# Patient Record
Sex: Male | Born: 1940 | Race: White | Hispanic: No | Marital: Married | Smoking: Former smoker
Health system: Southern US, Community
[De-identification: ages and names within clinical notes are randomized; demographics above are authoritative.]

## PROBLEM LIST (undated history)

## (undated) DIAGNOSIS — Z5189 Encounter for other specified aftercare: Secondary | ICD-10-CM

## (undated) DIAGNOSIS — R011 Cardiac murmur, unspecified: Secondary | ICD-10-CM

## (undated) DIAGNOSIS — Z860100 Personal history of colon polyps, unspecified: Secondary | ICD-10-CM

## (undated) DIAGNOSIS — IMO0001 Reserved for inherently not codable concepts without codable children: Secondary | ICD-10-CM

## (undated) DIAGNOSIS — H269 Unspecified cataract: Secondary | ICD-10-CM

## (undated) DIAGNOSIS — E785 Hyperlipidemia, unspecified: Secondary | ICD-10-CM

## (undated) DIAGNOSIS — Z8719 Personal history of other diseases of the digestive system: Secondary | ICD-10-CM

## (undated) DIAGNOSIS — T4145XA Adverse effect of unspecified anesthetic, initial encounter: Secondary | ICD-10-CM

## (undated) DIAGNOSIS — I10 Essential (primary) hypertension: Secondary | ICD-10-CM

## (undated) DIAGNOSIS — T8859XA Other complications of anesthesia, initial encounter: Secondary | ICD-10-CM

## (undated) DIAGNOSIS — E079 Disorder of thyroid, unspecified: Secondary | ICD-10-CM

## (undated) DIAGNOSIS — I2510 Atherosclerotic heart disease of native coronary artery without angina pectoris: Secondary | ICD-10-CM

## (undated) DIAGNOSIS — M1990 Unspecified osteoarthritis, unspecified site: Secondary | ICD-10-CM

## (undated) DIAGNOSIS — I350 Nonrheumatic aortic (valve) stenosis: Secondary | ICD-10-CM

## (undated) HISTORY — DX: Nonrheumatic aortic (valve) stenosis: I35.0

## (undated) HISTORY — DX: Hyperlipidemia, unspecified: E78.5

## (undated) HISTORY — DX: Unspecified cataract: H26.9

## (undated) HISTORY — DX: Encounter for other specified aftercare: Z51.89

## (undated) HISTORY — DX: Cardiac murmur, unspecified: R01.1

## (undated) HISTORY — DX: Essential (primary) hypertension: I10

## (undated) HISTORY — DX: Atherosclerotic heart disease of native coronary artery without angina pectoris: I25.10

## (undated) HISTORY — DX: Personal history of colon polyps, unspecified: Z86.0100

## (undated) HISTORY — PX: COLONOSCOPY: SHX174

## (undated) HISTORY — PX: POLYPECTOMY: SHX149

## (undated) HISTORY — PX: TONSILLECTOMY: SUR1361

## (undated) HISTORY — DX: Disorder of thyroid, unspecified: E07.9

## (undated) HISTORY — DX: Personal history of colonic polyps: Z86.010

---

## 1980-02-17 HISTORY — PX: CHOLECYSTECTOMY: SHX55

## 1998-02-16 HISTORY — PX: TOTAL HIP ARTHROPLASTY: SHX124

## 1998-02-16 HISTORY — PX: TOTAL KNEE ARTHROPLASTY: SHX125

## 2006-02-16 DIAGNOSIS — I350 Nonrheumatic aortic (valve) stenosis: Secondary | ICD-10-CM

## 2006-02-16 DIAGNOSIS — I2510 Atherosclerotic heart disease of native coronary artery without angina pectoris: Secondary | ICD-10-CM

## 2006-02-16 HISTORY — PX: CARDIAC CATHETERIZATION: SHX172

## 2006-02-16 HISTORY — DX: Atherosclerotic heart disease of native coronary artery without angina pectoris: I25.10

## 2006-02-16 HISTORY — DX: Nonrheumatic aortic (valve) stenosis: I35.0

## 2006-03-26 LAB — HM COLONOSCOPY: HM Colonoscopy: NORMAL

## 2007-02-17 HISTORY — PX: CORONARY ARTERY BYPASS GRAFT: SHX141

## 2007-02-17 HISTORY — PX: AORTIC VALVE REPLACEMENT: SHX41

## 2009-02-16 HISTORY — PX: CORRECTION HAMMER TOE: SUR317

## 2010-04-10 ENCOUNTER — Encounter: Payer: Self-pay | Admitting: Internal Medicine

## 2010-04-10 ENCOUNTER — Ambulatory Visit (INDEPENDENT_AMBULATORY_CARE_PROVIDER_SITE_OTHER): Payer: Medicare Other | Admitting: Internal Medicine

## 2010-04-10 DIAGNOSIS — Z860100 Personal history of colon polyps, unspecified: Secondary | ICD-10-CM | POA: Insufficient documentation

## 2010-04-10 DIAGNOSIS — I252 Old myocardial infarction: Secondary | ICD-10-CM | POA: Insufficient documentation

## 2010-04-10 DIAGNOSIS — I2510 Atherosclerotic heart disease of native coronary artery without angina pectoris: Secondary | ICD-10-CM

## 2010-04-10 DIAGNOSIS — Z952 Presence of prosthetic heart valve: Secondary | ICD-10-CM | POA: Insufficient documentation

## 2010-04-10 DIAGNOSIS — I10 Essential (primary) hypertension: Secondary | ICD-10-CM | POA: Insufficient documentation

## 2010-04-10 DIAGNOSIS — E785 Hyperlipidemia, unspecified: Secondary | ICD-10-CM | POA: Insufficient documentation

## 2010-04-15 NOTE — Assessment & Plan Note (Signed)
Summary: NEW MEDICARE PT--#--PKG--STC   Vital Signs:  Patient profile:   70 year old male Height:      71 inches Weight:      235 pounds BMI:     32.89 O2 Sat:      98 % Temp:     98.9 degrees F oral Pulse rate:   76 / minute Pulse rhythm:   regular Resp:     16 per minute BP sitting:   140 / 66  (left arm) Cuff size:   regular  Vitals Entered By: Lanier Prude, Beverly Gust) (April 10, 2010 4:15 PM)  Nutrition Counseling: Patient's BMI is greater than 25 and therefore counseled on weight management options. CC: Est PCP, Preventive Care Is Patient Diabetic? No Pain Assessment Patient in pain? no        Primary Care Provider:  Yetta Barre  CC:  Est PCP and Preventive Care.  History of Present Illness: New to me he wants a new PCP. He tells me that he saw his prior PCP, Dr. Anner Crete, one month ago and that all of his labs were normal. He does not want to do anu labs today. He feels well and offers no complaints. He has no records withhim today but he will have them forwarded to me.  Preventive Screening-Counseling & Management  Alcohol-Tobacco     Alcohol drinks/day: <1     Alcohol type: beer     >5/day in last 3 mos: no     Alcohol Counseling: not indicated; use of alcohol is not excessive or problematic     Feels need to cut down: no     Feels annoyed by complaints: no     Feels guilty re: drinking: no     Needs 'eye opener' in am: no     Smoking Status: quit > 6 months     Year Quit: 1974     Tobacco Counseling: to remain off tobacco products  Caffeine-Diet-Exercise     Does Patient Exercise: yes  Hep-HIV-STD-Contraception     Hepatitis Risk: no risk noted     HIV Risk: no risk noted     STD Risk: no risk noted     Dental Visit-last 6 months yes     Dental Care Counseling: to seek dental care; no dental care within six months      Sexual History:  currently monogamous.        Drug Use:  no.        Blood Transfusions:  no.    Current Medications  (verified): 1)  Aspirin 325 Mg Tabs (Aspirin) .Marland Kitchen.. 1 By Mouth Once Daily 2)  Crestor 10 Mg Tabs (Rosuvastatin Calcium) .Marland Kitchen.. 1 By Mouth Once Daily 3)  Benicar Hct 40-12.5 Mg Tabs (Olmesartan Medoxomil-Hctz) .Marland Kitchen.. 1 By Mouth Once Daily 4)  Metoprolol Tartrate 25 Mg Tabs (Metoprolol Tartrate) .... 2 By Mouth Once Daily 5)  Furosemide 40 Mg Tabs (Furosemide) .Marland Kitchen.. 1 By Mouth Once Daily 6)  Meloxicam 15 Mg Tabs (Meloxicam) .Marland Kitchen.. 1 By Mouth Once Daily  Allergies (verified): No Known Drug Allergies  Past History:  Past Medical History: Colonic polyps, hx of Coronary artery disease Hyperlipidemia Hypertension Myocardial infarction, hx of Aortic stenosis  Past Surgical History: Aortic Valve Replacement:  Cholecystectomy Total hip replacement Total knee replacement  Family History: Family History High cholesterol Family History Hypertension  Social History: Retired Married Alcohol use-yes Drug use-no Regular exercise-yes Smoking Status:  quit > 6 months Hepatitis Risk:  no risk noted HIV Risk:  no risk noted STD Risk:  no risk noted Dental Care w/in 6 mos.:  yes Sexual History:  currently monogamous Blood Transfusions:  no Drug Use:  no Does Patient Exercise:  yes  Review of Systems       The patient complains of weight gain.  The patient denies anorexia, fever, weight loss, chest pain, syncope, dyspnea on exertion, peripheral edema, prolonged cough, headaches, hemoptysis, abdominal pain, hematuria, muscle weakness, suspicious skin lesions, difficulty walking, depression, unusual weight change, abnormal bleeding, and enlarged lymph nodes.   CV:  Denies chest pain or discomfort, difficulty breathing while lying down, fainting, fatigue, leg cramps with exertion, lightheadness, near fainting, palpitations, shortness of breath with exertion, and swelling of feet.  Physical Exam  General:  alert, well-developed, well-nourished, well-hydrated, appropriate dress, normal appearance,  healthy-appearing, cooperative to examination, and overweight-appearing.   Head:  normocephalic, atraumatic, no abnormalities observed, and no abnormalities palpated.   Eyes:  vision grossly intact, pupils equal, pupils round, and pupils reactive to light.   Ears:  R ear normal and L ear normal.   Nose:  External nasal examination shows no deformity or inflammation. Nasal mucosa are pink and moist without lesions or exudates. Mouth:  Oral mucosa and oropharynx without lesions or exudates.  Teeth in good repair. Neck:  supple, full ROM, no masses, no thyromegaly, no thyroid nodules or tenderness, no JVD, no HJR, normal carotid upstroke, and no carotid bruits.   Lungs:  normal respiratory effort, no intercostal retractions, no accessory muscle use, normal breath sounds, no dullness, no fremitus, no crackles, and no wheezes.   Heart:  normal rate, regular rhythm, no gallop, no rub, and no JVD.   Abdomen:  soft, non-tender, normal bowel sounds, no distention, no masses, no guarding, no rigidity, no rebound tenderness, no abdominal hernia, no inguinal hernia, no hepatomegaly, no splenomegaly, and abdominal scar(s).   Msk:  normal ROM, no joint tenderness, no joint swelling, no joint warmth, no redness over joints, no joint deformities, no joint instability, and no crepitation.   Pulses:  R and L carotid,radial,femoral,dorsalis pedis and posterior tibial pulses are full and equal bilaterally Extremities:  trace left pedal edema and trace right pedal edema.   Neurologic:  No cranial nerve deficits noted. Station and gait are normal. Plantar reflexes are down-going bilaterally. DTRs are symmetrical throughout. Sensory, motor and coordinative functions appear intact.   Impression & Recommendations:  Problem # 1:  HYPERTENSION (ICD-401.9) Assessment Improved  His updated medication list for this problem includes:    Benicar Hct 40-12.5 Mg Tabs (Olmesartan medoxomil-hctz) .Marland Kitchen... 1 by mouth once daily     Metoprolol Tartrate 25 Mg Tabs (Metoprolol tartrate) .Marland Kitchen... 2 by mouth once daily    Furosemide 40 Mg Tabs (Furosemide) .Marland Kitchen... 1 by mouth once daily  BP today: 140/66  Problem # 2:  CORONARY ARTERY DISEASE (ICD-414.00) Assessment: Unchanged  His updated medication list for this problem includes:    Aspirin 325 Mg Tabs (Aspirin) .Marland Kitchen... 1 by mouth once daily    Benicar Hct 40-12.5 Mg Tabs (Olmesartan medoxomil-hctz) .Marland Kitchen... 1 by mouth once daily    Metoprolol Tartrate 25 Mg Tabs (Metoprolol tartrate) .Marland Kitchen... 2 by mouth once daily    Furosemide 40 Mg Tabs (Furosemide) .Marland Kitchen... 1 by mouth once daily  Complete Medication List: 1)  Aspirin 325 Mg Tabs (Aspirin) .Marland Kitchen.. 1 by mouth once daily 2)  Crestor 10 Mg Tabs (Rosuvastatin calcium) .Marland Kitchen.. 1 by mouth once daily 3)  Benicar Hct 40-12.5 Mg Tabs (Olmesartan  medoxomil-hctz) .Marland Kitchen.. 1 by mouth once daily 4)  Metoprolol Tartrate 25 Mg Tabs (Metoprolol tartrate) .... 2 by mouth once daily 5)  Furosemide 40 Mg Tabs (Furosemide) .Marland Kitchen.. 1 by mouth once daily 6)  Meloxicam 15 Mg Tabs (Meloxicam) .Marland Kitchen.. 1 by mouth once daily  Colorectal Screening:  Current Recommendations:    Hemoccult: patient refused  Colonoscopy Results:    Date of Exam: 06/07/2006    Results: Normal  PSA Screening:    Reviewed PSA screening recommendations: Pro's and Cons's of PSA discussed and patient chooses to defer  Immunization & Chemoprophylaxis:    Influenza vaccine: Historical  (11/22/2009)    Pneumovax: Historical  (12/19/2008)  Patient Instructions: 1)  Please schedule a follow-up appointment in 3 months. 2)  It is important that you exercise regularly at least 20 minutes 5 times a week. If you develop chest pain, have severe difficulty breathing, or feel very tired , stop exercising immediately and seek medical attention. 3)  You need to lose weight. Consider a lower calorie diet and regular exercise.  4)  Check your Blood Pressure regularly. If it is above 130/80: you should  make an appointment.   Orders Added: 1)  New Patient Level III [99203]   Immunization History:  Influenza Immunization History:    Influenza:  historical (11/22/2009)  Pneumovax Immunization History:    Pneumovax:  historical (12/19/2008)   Immunization History:  Influenza Immunization History:    Influenza:  Historical (11/22/2009)  Pneumovax Immunization History:    Pneumovax:  Historical (12/19/2008)  Prevention & Chronic Care Immunizations   Influenza vaccine: Historical  (11/22/2009)    Tetanus booster: Not documented   Td booster deferral: Refused  (04/10/2010)    Pneumococcal vaccine: Historical  (12/19/2008)    H. zoster vaccine: Not documented   H. zoster vaccine deferral: Refused  (04/10/2010)  Colorectal Screening   Hemoccult: Not documented   Hemoccult action/deferral: patient refused  (04/10/2010)    Colonoscopy: Normal  (06/07/2006)  Other Screening   PSA: Not documented   PSA action/deferral: Pro's and Cons's of PSA discussed and patient chooses to defer  (04/10/2010)   Smoking status: quit > 6 months  (04/10/2010)  Lipids   Total Cholesterol: Not documented   LDL: Not documented   LDL Direct: Not documented   HDL: Not documented   Triglycerides: Not documented    SGOT (AST): Not documented   SGPT (ALT): Not documented   Alkaline phosphatase: Not documented   Total bilirubin: Not documented  Hypertension   Last Blood Pressure: 140 / 66  (04/10/2010)   Serum creatinine: Not documented   Serum potassium Not documented  Self-Management Support :    Hypertension self-management support: Not documented    Lipid self-management support: Not documented

## 2010-07-18 ENCOUNTER — Telehealth: Payer: Self-pay | Admitting: *Deleted

## 2010-07-18 NOTE — Telephone Encounter (Signed)
sure

## 2010-07-18 NOTE — Telephone Encounter (Signed)
Pending signature

## 2010-07-18 NOTE — Telephone Encounter (Signed)
Patient requesting permanent handicap form, OK?

## 2010-07-21 NOTE — Telephone Encounter (Signed)
Patient notified to pick up/LMOVM 

## 2010-08-06 ENCOUNTER — Telehealth: Payer: Self-pay | Admitting: *Deleted

## 2010-08-06 DIAGNOSIS — L309 Dermatitis, unspecified: Secondary | ICD-10-CM

## 2010-08-06 NOTE — Telephone Encounter (Signed)
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  Can't get into [facility] until end of August w/o referral.

## 2010-08-06 NOTE — Telephone Encounter (Signed)
done

## 2010-08-08 NOTE — Telephone Encounter (Signed)
Pt advised of referral and will expect a call from [facility] with appt info 

## 2010-08-26 ENCOUNTER — Encounter: Payer: Self-pay | Admitting: Internal Medicine

## 2010-09-01 ENCOUNTER — Ambulatory Visit (INDEPENDENT_AMBULATORY_CARE_PROVIDER_SITE_OTHER): Payer: Medicare Other | Admitting: Internal Medicine

## 2010-09-01 ENCOUNTER — Other Ambulatory Visit (INDEPENDENT_AMBULATORY_CARE_PROVIDER_SITE_OTHER): Payer: 59

## 2010-09-01 ENCOUNTER — Other Ambulatory Visit: Payer: Self-pay | Admitting: *Deleted

## 2010-09-01 ENCOUNTER — Ambulatory Visit (INDEPENDENT_AMBULATORY_CARE_PROVIDER_SITE_OTHER)
Admission: RE | Admit: 2010-09-01 | Discharge: 2010-09-01 | Disposition: A | Discharge status: Home / Self Care | Payer: 59 | Source: Ambulatory Visit | Attending: Internal Medicine | Admitting: Internal Medicine

## 2010-09-01 ENCOUNTER — Telehealth: Payer: Self-pay | Admitting: *Deleted

## 2010-09-01 ENCOUNTER — Other Ambulatory Visit: Payer: Self-pay | Admitting: Internal Medicine

## 2010-09-01 ENCOUNTER — Encounter: Payer: Self-pay | Admitting: Internal Medicine

## 2010-09-01 DIAGNOSIS — M546 Pain in thoracic spine: Secondary | ICD-10-CM

## 2010-09-01 DIAGNOSIS — E785 Hyperlipidemia, unspecified: Secondary | ICD-10-CM

## 2010-09-01 DIAGNOSIS — Z23 Encounter for immunization: Secondary | ICD-10-CM

## 2010-09-01 DIAGNOSIS — N400 Enlarged prostate without lower urinary tract symptoms: Secondary | ICD-10-CM

## 2010-09-01 DIAGNOSIS — Z0000 Encounter for general adult medical examination without abnormal findings: Secondary | ICD-10-CM

## 2010-09-01 DIAGNOSIS — M549 Dorsalgia, unspecified: Secondary | ICD-10-CM

## 2010-09-01 DIAGNOSIS — M545 Low back pain: Secondary | ICD-10-CM

## 2010-09-01 DIAGNOSIS — I10 Essential (primary) hypertension: Secondary | ICD-10-CM

## 2010-09-01 DIAGNOSIS — Z125 Encounter for screening for malignant neoplasm of prostate: Secondary | ICD-10-CM

## 2010-09-01 LAB — LIPID PANEL
Cholesterol: 183 mg/dL (ref 0–200)
HDL: 51.1 mg/dL (ref >39.00)
LDL Cholesterol: 105 mg/dL — ABNORMAL HIGH (ref 0–99)
Triglycerides: 133 mg/dL (ref 0.0–149.0)
VLDL: 26.6 mg/dL (ref 0.0–40.0)

## 2010-09-01 LAB — CBC WITH DIFFERENTIAL/PLATELET
Basophils Absolute: 0 10*3/uL (ref 0.0–0.1)
Eosinophils Absolute: 0.3 10*3/uL (ref 0.0–0.7)
Hemoglobin: 13.9 g/dL (ref 13.0–17.0)
Lymphocytes Relative: 25.9 % (ref 12.0–46.0)
MCHC: 33.8 g/dL (ref 30.0–36.0)
Neutro Abs: 5.4 10*3/uL (ref 1.4–7.7)
Platelets: 166 10*3/uL (ref 150.0–400.0)
RDW: 14.4 % (ref 11.5–14.6)

## 2010-09-01 LAB — URINALYSIS, ROUTINE W REFLEX MICROSCOPIC
Bilirubin Urine: NEGATIVE
Hgb urine dipstick: NEGATIVE
Ketones, ur: NEGATIVE
Leukocytes, UA: NEGATIVE
Nitrite: NEGATIVE
Specific Gravity, Urine: 1.01
Total Protein, Urine: NEGATIVE
Urine Glucose: NEGATIVE
Urobilinogen, UA: 0.2
pH: 5 (ref 5.0–8.0)

## 2010-09-01 LAB — PSA: PSA: 0.98 ng/mL (ref 0.10–4.00)

## 2010-09-01 LAB — COMPREHENSIVE METABOLIC PANEL WITH GFR
ALT: 27 U/L (ref 0–53)
AST: 29 U/L (ref 0–37)
Albumin: 4.2 g/dL (ref 3.5–5.2)
Alkaline Phosphatase: 71 U/L (ref 39–117)
BUN: 24 mg/dL — ABNORMAL HIGH (ref 6–23)
CO2: 29 meq/L (ref 19–32)
Calcium: 9 mg/dL (ref 8.4–10.5)
Chloride: 100 meq/L (ref 96–112)
Creatinine, Ser: 1.1 mg/dL (ref 0.4–1.5)
GFR: 71.98 mL/min
Glucose, Bld: 107 mg/dL — ABNORMAL HIGH (ref 70–99)
Potassium: 4.5 meq/L (ref 3.5–5.1)
Sodium: 138 meq/L (ref 135–145)
Total Bilirubin: 0.8 mg/dL (ref 0.3–1.2)
Total Protein: 7.5 g/dL (ref 6.0–8.3)

## 2010-09-01 MED ORDER — TRAMADOL HCL 50 MG PO TABS: 50.0000 mg | ORAL_TABLET | Freq: Four times a day (QID) | ORAL | Status: DC | PRN

## 2010-09-01 MED ORDER — TRAMADOL HCL 50 MG PO TABS
50.0000 mg | ORAL_TABLET | Freq: Four times a day (QID) | ORAL | Status: DC | PRN
Start: 2010-09-01 — End: 2010-09-01

## 2010-09-01 NOTE — Assessment & Plan Note (Signed)
His Xray shows spondylosis, I will refer him to PT/pain med if needed

## 2010-09-01 NOTE — Telephone Encounter (Signed)
The xray showed bone spurs along his spine, Rx for tramadol needs to be called in

## 2010-09-01 NOTE — Telephone Encounter (Signed)
Pt requesting results of xray and states he takes meloxicam for pain but medication no strong enough. Please Advise

## 2010-09-01 NOTE — Progress Notes (Signed)
Subjective:    Patient ID: Luis Herrera, male    DOB: Feb 02, 1941, 70 y.o.   MRN: [id number]  Back Pain This is a new problem. The current episode started more than 1 month ago. The problem occurs intermittently. The pain is present in the thoracic spine. The quality of the pain is described as burning and shooting. The pain is at a severity of 3/10. The pain is mild. The pain is worse during the day. The symptoms are aggravated by position. Stiffness is present all day. Pertinent negatives include no abdominal pain, bladder incontinence, bowel incontinence, chest pain, dysuria, fever, headaches, leg pain, numbness, paresis, paresthesias, pelvic pain, perianal numbness, tingling, weakness or weight loss. Risk factors include poor posture and lack of exercise. He has tried NSAIDs for the symptoms. The treatment provided moderate relief.  Hypertension This is a chronic problem. The current episode started more than 1 year ago. The problem has been gradually improving since onset. The problem is controlled. Pertinent negatives include no anxiety, blurred vision, chest pain, headaches, malaise/fatigue, neck pain, orthopnea, palpitations, peripheral edema, PND, shortness of breath or sweats. There are no associated agents to hypertension. Past treatments include angiotensin blockers and diuretics. The current treatment provides significant improvement. Compliance problems include exercise and diet.  There is no history of chronic renal disease.  Hyperlipidemia This is a chronic problem. The current episode started more than 1 year ago. The problem is controlled. Recent lipid tests were reviewed and are variable. Exacerbating diseases include obesity. He has no history of chronic renal disease, diabetes, hypothyroidism, liver disease or nephrotic syndrome. Factors aggravating his hyperlipidemia include beta blockers and fatty foods. Pertinent negatives include no chest pain, focal sensory loss, focal weakness, leg  pain, myalgias or shortness of breath. Current antihyperlipidemic treatment includes statins. The current treatment provides moderate improvement of lipids. Compliance problems include adherence to exercise and adherence to diet.       Review of Systems  Constitutional: Negative for fever, chills, weight loss, malaise/fatigue, diaphoresis, activity change, appetite change, fatigue and unexpected weight change.  HENT: Negative for facial swelling, trouble swallowing, neck pain and voice change.   Eyes: Negative for blurred vision, photophobia, redness and visual disturbance.  Respiratory: Negative for apnea, cough, choking, chest tightness, shortness of breath, wheezing and stridor.   Cardiovascular: Negative for chest pain, palpitations, orthopnea and PND.  Gastrointestinal: Negative for nausea, vomiting, abdominal pain, diarrhea, constipation, blood in stool, anal bleeding, rectal pain and bowel incontinence.  Genitourinary: Negative for bladder incontinence, dysuria, urgency, frequency, hematuria, flank pain, decreased urine volume, discharge, penile swelling, scrotal swelling, enuresis, difficulty urinating, genital sores, penile pain, testicular pain and pelvic pain.  Musculoskeletal: Positive for back pain (upper back). Negative for myalgias, joint swelling, arthralgias and gait problem.  Skin: Negative for color change, pallor, rash and wound.  Neurological: Negative for dizziness, tingling, tremors, focal weakness, seizures, syncope, facial asymmetry, speech difficulty, weakness, light-headedness, numbness, headaches and paresthesias.  Hematological: Negative for adenopathy. Does not bruise/bleed easily.  Psychiatric/Behavioral: Negative.        Objective:   Physical Exam  Vitals reviewed. Constitutional: He is oriented to person, place, and time. He appears well-developed and well-nourished. No distress.  HENT:  Head: Normocephalic and atraumatic.  Right Ear: External ear normal.    Left Ear: External ear normal.  Nose: Nose normal.  Mouth/Throat: Oropharynx is clear and moist. No oropharyngeal exudate.  Eyes: Conjunctivae and EOM are normal. Pupils are equal, round, and reactive to light. Right eye exhibits  no discharge. Left eye exhibits no discharge. No scleral icterus.  Neck: Normal range of motion. Neck supple. No JVD present. No tracheal deviation present. No thyromegaly present.  Cardiovascular: Normal rate, regular rhythm, normal heart sounds and intact distal pulses.  Exam reveals no gallop and no friction rub.   No murmur heard. Pulmonary/Chest: Effort normal and breath sounds normal. No stridor. No respiratory distress. He has no wheezes. He has no rales. Chest wall is not dull to percussion. He exhibits deformity. He exhibits no mass, no tenderness, no bony tenderness, no laceration, no crepitus, no edema, no swelling and no retraction. Right breast exhibits no inverted nipple, no mass, no nipple discharge, no skin change and no tenderness. Left breast exhibits no inverted nipple, no mass, no nipple discharge, no skin change and no tenderness. Breasts are symmetrical.         Sternotomy scar  Abdominal: Soft. Bowel sounds are normal. He exhibits no distension and no mass. There is no tenderness. There is no rebound and no guarding. Hernia confirmed negative in the right inguinal area and confirmed negative in the left inguinal area.  Genitourinary: Rectum normal and testes normal. Rectal exam shows no external hemorrhoid, no internal hemorrhoid, no fissure, no mass, no tenderness and anal tone normal. Guaiac negative stool. Prostate is enlarged (1+ bilateral smooth, symmetrical hypertrophy). Prostate is not tender. Right testis shows no mass, no swelling and no tenderness. Right testis is descended. Left testis shows no mass, no swelling and no tenderness. Left testis is descended. Circumcised. No penile tenderness. No discharge found.  Musculoskeletal: Normal range  of motion. He exhibits no edema and no tenderness.       Cervical back: Normal. He exhibits normal range of motion, no tenderness, no bony tenderness, no swelling, no edema, no deformity, no laceration, no pain, no spasm and normal pulse.       Thoracic back: Normal. He exhibits normal range of motion, no tenderness, no bony tenderness, no swelling, no edema, no deformity, no laceration, no pain and no spasm.  Lymphadenopathy:    He has no cervical adenopathy.       Right: No inguinal adenopathy present.       Left: No inguinal adenopathy present.  Neurological: He is alert and oriented to person, place, and time. He displays no atrophy, no tremor and normal reflexes. No cranial nerve deficit or sensory deficit. He exhibits normal muscle tone. He displays no seizure activity. Coordination and gait normal. He displays no Babinski's sign on the left side.  Reflex Scores:      Tricep reflexes are 1+ on the right side and 1+ on the left side.      Bicep reflexes are 1+ on the right side and 1+ on the left side.      Brachioradialis reflexes are 1+ on the right side and 1+ on the left side.      Patellar reflexes are 1+ on the right side and 1+ on the left side.      Achilles reflexes are 1+ on the right side and 1+ on the left side. Skin: Skin is warm and dry. No rash noted. He is not diaphoretic. No erythema. No pallor.  Psychiatric: He has a normal mood and affect. His behavior is normal. Judgment and thought content normal.          Assessment & Plan:

## 2010-09-01 NOTE — Assessment & Plan Note (Signed)
The patient is here for annual Medicare wellness examination and management of other chronic and acute problems.   The risk factors are reflected in the social history.  The roster of all physicians providing medical care to patient - is listed in the Snapshot section of the chart.  Activities of daily living:  The patient is 100% inedpendent in all ADLs: dressing, toileting, feeding as well as independent mobility  Home safety : The patient has smoke detectors in the home. They wear seatbelts.No firearms at home ( firearms are present in the home, kept in a safe fashion). There is no violence in the home.   There is no risks for hepatitis, STDs or HIV. There is no   history of blood transfusion. They have no travel history to infectious disease endemic areas of the world.  The patient has (has not) seen their dentist in the last six month. They have (not) seen their eye doctor in the last year. They deny (admit to) any hearing difficulty and have not had audiologic testing in the last year.  They do not  have excessive sun exposure. Discussed the need for sun protection: hats, long sleeves and use of sunscreen if there is significant sun exposure.   Diet: the importance of a healthy diet is discussed. They do have a healthy (unhealthy-high fat/fast food) diet.  The patient has a regular exercise program: walking , 20 minutes duration,  4 times per week.  The benefits of regular aerobic exercise were discussed.  Depression screen: there are no signs or vegative symptoms of depression- irritability, change in appetite, anhedonia, sadness/tearfullness.  Cognitive assessment: the patient manages all their financial and personal affairs and is actively engaged. They could relate day,date,year and events; recalled 3/3 objects at 3 minutes; performed clock-face test normally.  The following portions of the patient's history were reviewed and updated as appropriate: allergies, current medications,  past family history, past medical history,  past surgical history, past social history  and problem list.  Vision, hearing, body mass index were assessed and reviewed.   During the course of the visit the patient was educated and counseled about appropriate screening and preventive services including : fall prevention , diabetes screening, nutrition counseling, colorectal cancer screening, and recommended immunizations.  

## 2010-09-01 NOTE — Assessment & Plan Note (Signed)
BP is well controlled, I will check his lytes and renal function today 

## 2010-09-01 NOTE — Assessment & Plan Note (Signed)
He is doing well on crestor, I will check his labs today 

## 2010-09-01 NOTE — Patient Instructions (Signed)
Back Pain (Lumbosacral Strain) Back pain is one of the most common causes of pain. There are many causes of back pain. Most are not serious conditions.  CAUSES Your backbone (spinal column) is made up of 24 main vertebral bodies, the sacrum, and the coccyx. These are held together by muscles and tough, fibrous tissue (ligaments). Nerve roots pass through the openings between the vertebrae. A sudden move or injury to the back may cause injury to, or pressure on, these nerves. This may result in localized back pain or pain movement (radiation) into the buttocks, down the leg, and into the foot. Sharp, shooting pain from the buttock down the back of the leg (sciatica) is frequently associated with a ruptured (herniated) disc. Pain may be caused by muscle spasm alone. Your caregiver can often find the cause of your pain by the details of your symptoms and an exam. In some cases, you may need tests (such as X-rays). Your caregiver will work with you to decide if any tests are needed based on your specific exam. HOME CARE INSTRUCTIONS  Avoid an underactive lifestyle. Active exercise, as directed by your caregiver, is your greatest weapon against back pain.   Avoid hard physical activities (tennis, racquetball, water-skiing) if you are not in proper physical condition for it. This may aggravate and/or create problems.   If you have a back problem, avoid sports requiring sudden body movements. Swimming and walking are generally safer activities.   Maintain good posture.   Avoid becoming overweight (obese).   Use bed rest for only the most extreme, sudden (acute) episode. Your caregiver will help you determine how much bed rest is necessary.   For acute conditions, you may put ice on the injured area.   Put ice in a plastic bag.   Place a towel between your skin and the bag.   Leave the ice on for 20 minutes at a time, every 2 hours, or as needed.   After you are improved and more active, it may  help to apply heat for 30 minutes before activities.  See your caregiver if you are having pain that lasts longer than expected. Your caregiver can advise appropriate exercises and/or therapy if needed. With conditioning, most back problems can be avoided. SEEK IMMEDIATE MEDICAL CARE IF:  You have numbness, tingling, weakness, or problems with the use of your arms or legs.   You experience severe back pain not relieved with medicines.   There is a change in bowel or bladder control.   You have increasing pain in any area of the body, including your belly (abdomen).   You notice shortness of breath, dizziness, or feel faint.   You feel sick to your stomach (nauseous), are throwing up (vomiting), or become sweaty.   You notice discoloration of your toes or legs, or your feet get very cold.   Your back pain is getting worse.  You have an oral temperature above 100.5Health Maintenance in Males MAINTAIN REGULAR HEALTH EXAMS Maintain a healthy diet and normal weight. Increased weight leads to problems with blood pressure and diabetes. Decrease fat in the diet and increase exercise. Obtain a proper diet from your caregiver if necessary.  High blood pressure causes heart and blood vessel problems. Check blood pressures regularly and keep your blood pressure at normal limits. Aerobic exercise helps this. Persistent elevations of blood pressure should be treated with medications if weight loss and exercise are ineffective.  Avoid smoking, drinking in excess (more than 2 drinks per   day), or use of street drugs. Do not share needles with anyone. Ask for help if you need assistance or instructions on stopping the use of alcohol, cigarettes, or drugs.  Maintain normal blood lipids and cholesterol. Your caregiver can give you information to lower your risk of heart disease or stroke.  Ask your caregiver if you are in need of early heart disease screening because of a strong family history of heart disease  or signs of elevated testosterone (male sex hormone) levels. These can predispose you to early heart disease.  Practice safe sex. Practicing safe sex decreases your risk for a sexually transmitted infection (STI). Some of the STIs are gonorrhea, chlamydia, syphilis, trichimonas, herpes, human papillomavirus (HPV), and human immunodeficiency virus (HIV). Herpes, HIV, and HPV are viral illnesses that have no cure. These can result in disability, cancer, and death.  It is not safe for someone who has AIDS or is HIV positive to have unprotected sex with a partner who is HIV positive. The reason for this is the fact that there are many different strains of HIV. If you have a strain that is readily treated with medications and then suddenly introduce a strain from a partner that has no further treatment options, you may suddenly have a strain of HIV that is untreatable. Even if you are both positive for HIV, it is still necessary to practice safe sex.  Use sunscreen with a SPF of 15 or greater. Being outside in the sun when your shadow caused by the sun is shorter than you are, means you are being exposed to sun at greater intensity. Lighter skinned people are at a greater risk of skin cancer.  Keep carbon monoxide and smoke detectors in your home and functioning at all times. Change the batteries every 6 months.  Do monthly examinations of your testicles. The best time to do this is after a hot shower or bath when the tissues are loose. Notify your caregivers of any lumps, tenderness, or changes in size or shape.  Notify your caregiver of new moles or changes in moles, especially if there is a change in shape or color. Also notify your caregiver if a mole is larger than the size of a pencil eraser.  Stay current with your tetanus shots and other required immunizations.  The Body Mass Index (BMI) is a way of measuring how much of your body is fat. Having a BMI above 27 increases the risk of heart disease,  diabetes, hypertension, stroke, and other problems related to obesity. Document Released: 08/01/2007 Document Re-Released: 07/23/2009  [employer] Patient Information 2011 [employer] not controlled by medicine.  MAKE SURE YOU:   Understand these instructions.   Will watch your condition.   Will get help right away if you are not doing well or get worse.  Document Released: 11/12/2004 Document Re-Released: 04/29/2009 [employer] Patient Information 2011 [employer] 

## 2010-09-01 NOTE — Assessment & Plan Note (Signed)
I will check his PSA today 

## 2010-09-03 ENCOUNTER — Telehealth: Payer: Self-pay | Admitting: *Deleted

## 2010-09-03 DIAGNOSIS — M4714 Other spondylosis with myelopathy, thoracic region: Secondary | ICD-10-CM

## 2010-09-03 NOTE — Telephone Encounter (Signed)
Pt was recently seen in office and states that his Herby Abraham says he has bone spurs. Pt states that Tramadol is not helping with his pain, he is requesting MD's advisement on what to do next

## 2010-09-04 DIAGNOSIS — M4714 Other spondylosis with myelopathy, thoracic region: Secondary | ICD-10-CM | POA: Insufficient documentation

## 2010-09-04 NOTE — Telephone Encounter (Signed)
Referral to pain doc done

## 2010-09-04 NOTE — Telephone Encounter (Signed)
Pt informed

## 2010-09-05 ENCOUNTER — Telehealth: Payer: Self-pay

## 2010-09-05 NOTE — Telephone Encounter (Signed)
Patient called [facility] requesting status of pain mang referral. Thanks

## 2010-09-20 ENCOUNTER — Encounter: Payer: Self-pay | Admitting: Family Medicine

## 2010-09-20 ENCOUNTER — Ambulatory Visit (INDEPENDENT_AMBULATORY_CARE_PROVIDER_SITE_OTHER): Payer: Medicare Other | Admitting: Family Medicine

## 2010-09-20 VITALS — BP 134/76 | HR 80 | Temp 98.7°F | Wt 242.8 lb

## 2010-09-20 DIAGNOSIS — L0292 Furuncle, unspecified: Secondary | ICD-10-CM | POA: Insufficient documentation

## 2010-09-20 MED ORDER — DOXYCYCLINE HYCLATE 100 MG PO CAPS: 100.0000 mg | ORAL_CAPSULE | Freq: Two times a day (BID) | ORAL | Status: AC

## 2010-09-20 NOTE — Assessment & Plan Note (Signed)
IC obtained.  Unable to have pt sign form as don't know where forms are [facility] but discussed risks and benefits of procedure and alternatives.  Pt agrees to proceed. I&D performed. Pt tolerated well. Doxy 10 days. Red flags to return discussed.

## 2010-09-20 NOTE — Patient Instructions (Signed)
I&D performed today. Watch out for fevers, spreading redness or if starts draining pus.  If that happens, please return to be checked. Take antibiotics as directed.  Abscess/Boil (Furuncle) An abscess (boil or furuncle) is an infected area that contains a collection of pus.  SYMPTOMS Signs and symptoms of an abscess include pain, tenderness, redness, or hardness. You may feel a moveable soft area under your skin. An abscess can occur anywhere in the body.  TREATMENT An incision (cut by the caregiver) may have been made over your abscess so the pus could be drained out. Gauze may have been packed into the space or a drain may have been looped thru the abscess cavity (pocket). This provides a drain that will allow the cavity to heal from the inside outwards. The abscess may be painful for a few days, but should feel much better if it was drained. Your abscess, if seen early, may not have localized and may not have been drained. If not, another appointment may be required if it does not get better on its own or with medications. HOME CARE INSTRUCTIONS  Only take over-the-counter or prescription medicines for pain, discomfort, or fever as directed by your caregiver.   Keep the skin and clothes clean around your abscess.   If the abscess was drained, you will need to use gauze dressing ("4x4") to collect any draining pus. These dressing typically will need to be changed 3 or more times during the day.   The infection may spread by skin contact with others. Avoid skin contact as much as possible.   Good hygiene is very important including regular hand washing, cover any draining skin lesions, and don't share personal care items.   If you participate in sports do not share athletic equipment, towels, whirlpools, or personal care items. Shower after every practice or tournament.   If a draining area cannot be adequately covered:   Do not participate in sports   Children should not participate in  day care until the wound has healed or drainage stops.   If your caregiver has given you a follow-up appointment, it is very important to keep that appointment. Not keeping the appointment could result in a much worse infection, chronic or permanent injury, pain, and disability. If there is any problem keeping the appointment, you must call back to this facility for assistance.  SEEK MEDICAL CARE IF:  You develop increased pain, swelling, redness, drainage, or bleeding in the wound site.   You develop signs of generalized infection including muscle aches, chills, fever, or a general ill feeling.   You or your child has an oral temperature above 101.   Your baby is older than 3 months with a rectal temperature of 100.5 F (38.1 C) or higher for more than 1 day.  See your caregiver as directed for a recheck or sooner if you develop any of the symptoms described above. Take antibiotics (medicine that kills germs) as directed if they were prescribed. MAKE SURE YOU:   Understand these instructions.   Will watch your condition.   Will get help right away if you are not doing well or get worse.  Document Released: 11/12/2004 Document Re-Released: 07/23/2009 [facility] Patient Information 2011 [place], [place].

## 2010-09-20 NOTE — Progress Notes (Signed)
  Subjective:    Patient ID: Taksh Hjort, male    DOB: 1940/07/31, 70 y.o.   MRN: [id number]  HPI CC: abd lesion  1wk h/o spot on abd.  Started as boil several weeks ago.  Then this week got worse.  Not tender.  Started draining today.  Hasn't tried anything so far.  No fevers/chills, abd pain, n/v.  No h/o DM.  Just had foot surgery.  Not on abx.  Review of Systems Per HPI    Objective:   Physical Exam  Nursing note and vitals reviewed. Constitutional: He appears well-developed and well-nourished. No distress.  Abdominal: Soft.  Skin: Skin is warm and dry.          2cm blister filled with pus/blood mid abdomen inferior to umbilicus.  Some fluctuance, no induration.  Mild erythema surrounding Scab on right side of lesion.    Area prepped with betadine, anesthesia achieved with 1cc 2%lidocaine with epi, 11 scalpel blade used to cut into skin overlying lesion.  Small amt pus expressed from lesion.  Small loculation broken up with cotton tip applicator.  Too small area for packing.  Cleaned and abx ointment placed, dressed with gauze.     Assessment & Plan:

## 2010-09-24 ENCOUNTER — Encounter: Payer: Self-pay | Admitting: Internal Medicine

## 2010-10-10 ENCOUNTER — Encounter: Payer: Medicare Other | Attending: Physical Medicine & Rehabilitation

## 2010-10-10 ENCOUNTER — Ambulatory Visit: Payer: Medicare Other | Admitting: Physical Medicine & Rehabilitation

## 2010-10-10 DIAGNOSIS — M779 Enthesopathy, unspecified: Secondary | ICD-10-CM

## 2010-10-10 DIAGNOSIS — Z96649 Presence of unspecified artificial hip joint: Secondary | ICD-10-CM | POA: Insufficient documentation

## 2010-10-10 DIAGNOSIS — Z79899 Other long term (current) drug therapy: Secondary | ICD-10-CM | POA: Insufficient documentation

## 2010-10-10 DIAGNOSIS — Z96659 Presence of unspecified artificial knee joint: Secondary | ICD-10-CM | POA: Insufficient documentation

## 2010-10-10 DIAGNOSIS — S43429A Sprain of unspecified rotator cuff capsule, initial encounter: Secondary | ICD-10-CM

## 2010-10-10 DIAGNOSIS — I10 Essential (primary) hypertension: Secondary | ICD-10-CM | POA: Insufficient documentation

## 2010-10-10 DIAGNOSIS — M25519 Pain in unspecified shoulder: Secondary | ICD-10-CM | POA: Insufficient documentation

## 2010-10-10 DIAGNOSIS — M25819 Other specified joint disorders, unspecified shoulder: Secondary | ICD-10-CM | POA: Insufficient documentation

## 2010-10-10 NOTE — Progress Notes (Signed)
REASON FOR VISIT:  Pain around the right shoulder blade.  HISTORY:  A 70 year old male with no history of trauma.  He is 1 year post onset of some pain around his right shoulder blade.  He denies any neck pain.  He has no pain going down the arm.  He has had a fall while he was reading something and slipped on some steps.  Bruised his right knee and jammed his right shoulder.  He has had right shoulder pain intermittently before, but somewhat more constant now.  No numbness and tingling in the upper or lower extremities.  No bowel or bladder dysfunction.  PAST MEDICAL HISTORY:  Significant for hypertension as well as hyperlipidemia.  Valve replacement do not have records of this in 2009, also had hammer toes repaired right foot in June of this year.  PAST SURGICAL HISTORY:  Left hip replacement, left knee replacement in 2003 and 2004.  His Oswestry score is 2.  MEDICATIONS: 1. Benicar/hydrochlorothiazide 1 p.o. daily. 2. Metoprolol 50 mg a day. 3. Meloxicam 15 mg a day. 4. Furosemide 40 mg a day. 5. Crestor 10 mg a day. 6. Aspirin 325 daily.  REVIEW OF SYSTEMS:  Average pain is 5.  Pain right now is 2.  Sleep is fair.  Pain improves with rest.  He drives.  He is fully functional.  He is retired.  His review of systems is otherwise negative.  SOCIAL HISTORY:  Married, lives with his wife.  Nonsmoker, nondrinker.  FAMILY HISTORY:  Hypertension.  PHYSICAL EXAMINATION:  VITAL SIGNS:  Blood pressure 120/55, pulse 81, respirations 18, and O2 sat 97% on room air. GENERAL:  No acute stress.  Mood and affect appropriate. NECK:  Has full range of motion without pain.  Left shoulder is normal range of motion.  Negative impingement signs.  Right shoulder is normal range of motion, but positive impingement sign at 90 degrees.  He has normal strength in the upper extremities.  He has no tenderness over the [facility] joint.  He has tenderness over inferior angle of the scapula.  This is more on  the medial aspect of it rather than the lateral aspect.  He has negative winging of the scapula.  He has minimal tenderness over the thoracic paraspinal muscles.  His lower extremity strength is normal. He has healed scar of his left total knee.  Good range of motion, bilateral knees, hips, and ankles.  He has an abrasion on his right knee. Gait is without toe drag or knee instability.  IMPRESSION: 1. Right parascapular muscle strain, this could be either trapezius or     rhomboid muscles. 2. 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  No sign     of tear at least clinically. 3. Status post left hip and knee replacement.  No complaints in     regards to this. 4. Right knee abrasion, no signs of joint abnormalities or effusion.  PLAN: 1. We will refer to physical therapy in regards to upper extremity     parascapular strengthening program.  He may use modalities and also     instructing home exercise program. 2. I will see the patient back in 1 month.  If not much better, we     will either do a subacromial injection or trigger point injection     depending on which area is bothering him either the shoulder joint     area versus the parascapular area.  Failing that, we may do a  ultrasound of the right shoulder.  Discussed with the patient and wife, agree with plan.     Erick Colace, M.D. Electronically Signed    AEK/MedQ D:  10/10/2010 13:22:50  T:  10/10/2010 18:15:35  Job #:  [id number]  cc:   Sanda Linger, MD [address] [place] [address] [place] [address]

## 2010-10-14 ENCOUNTER — Ambulatory Visit: Payer: Medicare Other | Attending: Physical Medicine & Rehabilitation | Admitting: Physical Therapy

## 2010-10-14 DIAGNOSIS — IMO0001 Reserved for inherently not codable concepts without codable children: Secondary | ICD-10-CM | POA: Insufficient documentation

## 2010-10-14 DIAGNOSIS — M25619 Stiffness of unspecified shoulder, not elsewhere classified: Secondary | ICD-10-CM | POA: Insufficient documentation

## 2010-10-14 DIAGNOSIS — M25519 Pain in unspecified shoulder: Secondary | ICD-10-CM | POA: Insufficient documentation

## 2010-10-16 ENCOUNTER — Encounter: Payer: Medicare Other | Admitting: Physical Therapy

## 2010-10-17 ENCOUNTER — Ambulatory Visit: Payer: Medicare Other | Admitting: Physical Therapy

## 2010-10-21 ENCOUNTER — Ambulatory Visit: Payer: Medicare Other | Attending: Physical Medicine & Rehabilitation | Admitting: Physical Therapy

## 2010-10-21 DIAGNOSIS — M25619 Stiffness of unspecified shoulder, not elsewhere classified: Secondary | ICD-10-CM | POA: Insufficient documentation

## 2010-10-21 DIAGNOSIS — M25519 Pain in unspecified shoulder: Secondary | ICD-10-CM | POA: Insufficient documentation

## 2010-10-21 DIAGNOSIS — IMO0001 Reserved for inherently not codable concepts without codable children: Secondary | ICD-10-CM | POA: Insufficient documentation

## 2010-10-28 ENCOUNTER — Ambulatory Visit: Payer: Medicare Other | Admitting: Physical Therapy

## 2010-10-30 ENCOUNTER — Ambulatory Visit: Payer: Medicare Other | Admitting: Physical Therapy

## 2010-10-31 ENCOUNTER — Ambulatory Visit: Payer: Medicare Other | Admitting: Physical Therapy

## 2010-11-04 ENCOUNTER — Encounter: Payer: Medicare Other | Admitting: Physical Therapy

## 2010-11-06 ENCOUNTER — Encounter: Payer: Medicare Other | Admitting: Physical Therapy

## 2010-11-07 ENCOUNTER — Encounter: Payer: Medicare Other | Attending: Physical Medicine & Rehabilitation

## 2010-11-07 ENCOUNTER — Ambulatory Visit: Payer: Medicare Other | Admitting: Physical Medicine & Rehabilitation

## 2010-11-07 ENCOUNTER — Encounter: Payer: Medicare Other | Admitting: Physical Therapy

## 2010-11-07 DIAGNOSIS — M25819 Other specified joint disorders, unspecified shoulder: Secondary | ICD-10-CM | POA: Insufficient documentation

## 2010-11-07 DIAGNOSIS — M25519 Pain in unspecified shoulder: Secondary | ICD-10-CM | POA: Insufficient documentation

## 2010-11-07 DIAGNOSIS — Z96649 Presence of unspecified artificial hip joint: Secondary | ICD-10-CM | POA: Insufficient documentation

## 2010-11-07 DIAGNOSIS — I10 Essential (primary) hypertension: Secondary | ICD-10-CM | POA: Insufficient documentation

## 2010-11-07 DIAGNOSIS — Z79899 Other long term (current) drug therapy: Secondary | ICD-10-CM | POA: Insufficient documentation

## 2010-11-07 DIAGNOSIS — Z96659 Presence of unspecified artificial knee joint: Secondary | ICD-10-CM | POA: Insufficient documentation

## 2010-11-11 ENCOUNTER — Encounter: Payer: Medicare Other | Admitting: Physical Therapy

## 2010-11-13 ENCOUNTER — Encounter: Payer: Medicare Other | Admitting: Physical Therapy

## 2010-11-14 ENCOUNTER — Encounter: Payer: Medicare Other | Admitting: Physical Therapy

## 2010-12-26 ENCOUNTER — Telehealth: Payer: Self-pay

## 2010-12-26 NOTE — Telephone Encounter (Signed)
Patient called [facility]  Requesting that MD switch cholesterol medication to something generic due to cost of crestor. Returned call to advise MD out of the office and will forward messg once he returns.

## 2010-12-31 ENCOUNTER — Other Ambulatory Visit: Payer: Self-pay | Admitting: *Deleted

## 2010-12-31 MED ORDER — ATORVASTATIN CALCIUM 80 MG PO TABS: 80.0000 mg | ORAL_TABLET | Freq: Every day | ORAL | Status: DC

## 2011-01-06 ENCOUNTER — Ambulatory Visit: Payer: 59 | Admitting: Internal Medicine

## 2011-01-23 ENCOUNTER — Telehealth: Payer: Self-pay

## 2011-01-23 MED ORDER — ATORVASTATIN CALCIUM 80 MG PO TABS: 80.0000 mg | ORAL_TABLET | Freq: Every day | ORAL | Status: DC

## 2011-01-23 NOTE — Telephone Encounter (Signed)
refill 

## 2011-01-27 ENCOUNTER — Ambulatory Visit: Payer: Medicare Other | Admitting: Internal Medicine

## 2011-01-27 DIAGNOSIS — Z0289 Encounter for other administrative examinations: Secondary | ICD-10-CM

## 2011-02-25 DIAGNOSIS — M216X9 Other acquired deformities of unspecified foot: Secondary | ICD-10-CM | POA: Diagnosis not present

## 2011-02-25 DIAGNOSIS — M19079 Primary osteoarthritis, unspecified ankle and foot: Secondary | ICD-10-CM | POA: Diagnosis not present

## 2011-05-05 DIAGNOSIS — M62838 Other muscle spasm: Secondary | ICD-10-CM | POA: Diagnosis not present

## 2011-05-05 DIAGNOSIS — M205X9 Other deformities of toe(s) (acquired), unspecified foot: Secondary | ICD-10-CM | POA: Diagnosis not present

## 2011-05-05 DIAGNOSIS — M2010 Hallux valgus (acquired), unspecified foot: Secondary | ICD-10-CM | POA: Diagnosis not present

## 2011-05-05 DIAGNOSIS — M2040 Other hammer toe(s) (acquired), unspecified foot: Secondary | ICD-10-CM | POA: Diagnosis not present

## 2011-05-26 ENCOUNTER — Encounter (HOSPITAL_BASED_OUTPATIENT_CLINIC_OR_DEPARTMENT_OTHER)
Admission: RE | Admit: 2011-05-26 | Discharge: 2011-05-26 | Disposition: A | Discharge status: Home / Self Care | Payer: Medicare Other | Source: Ambulatory Visit | Attending: Orthopedic Surgery | Admitting: Orthopedic Surgery

## 2011-05-26 ENCOUNTER — Encounter (HOSPITAL_BASED_OUTPATIENT_CLINIC_OR_DEPARTMENT_OTHER): Payer: Self-pay | Admitting: *Deleted

## 2011-05-26 DIAGNOSIS — I359 Nonrheumatic aortic valve disorder, unspecified: Secondary | ICD-10-CM | POA: Diagnosis not present

## 2011-05-26 DIAGNOSIS — M24573 Contracture, unspecified ankle: Secondary | ICD-10-CM | POA: Diagnosis not present

## 2011-05-26 DIAGNOSIS — M6240 Contracture of muscle, unspecified site: Secondary | ICD-10-CM | POA: Diagnosis not present

## 2011-05-26 DIAGNOSIS — I10 Essential (primary) hypertension: Secondary | ICD-10-CM | POA: Diagnosis not present

## 2011-05-26 DIAGNOSIS — Z01812 Encounter for preprocedural laboratory examination: Secondary | ICD-10-CM | POA: Diagnosis not present

## 2011-05-26 DIAGNOSIS — Q667 Congenital pes cavus, unspecified foot: Secondary | ICD-10-CM | POA: Diagnosis not present

## 2011-05-26 DIAGNOSIS — I2510 Atherosclerotic heart disease of native coronary artery without angina pectoris: Secondary | ICD-10-CM | POA: Diagnosis not present

## 2011-05-26 DIAGNOSIS — M2040 Other hammer toe(s) (acquired), unspecified foot: Secondary | ICD-10-CM | POA: Diagnosis not present

## 2011-05-26 LAB — BASIC METABOLIC PANEL
BUN: 30 mg/dL — ABNORMAL HIGH (ref 6–23)
CO2: 24 mEq/L (ref 19–32)
Chloride: 104 mEq/L (ref 96–112)
Glucose, Bld: 104 mg/dL — ABNORMAL HIGH (ref 70–99)
Potassium: 4.7 mEq/L (ref 3.5–5.1)

## 2011-05-26 NOTE — Progress Notes (Signed)
To come in for bmet Sees cardiology [facility]-aortic valve replacement in [place] No MI Denies any resp problems Cardiac clearance

## 2011-05-27 ENCOUNTER — Encounter (HOSPITAL_BASED_OUTPATIENT_CLINIC_OR_DEPARTMENT_OTHER): Payer: Self-pay | Admitting: *Deleted

## 2011-05-28 ENCOUNTER — Other Ambulatory Visit: Payer: Self-pay

## 2011-05-28 ENCOUNTER — Encounter (HOSPITAL_BASED_OUTPATIENT_CLINIC_OR_DEPARTMENT_OTHER): Payer: Self-pay | Admitting: *Deleted

## 2011-05-28 ENCOUNTER — Ambulatory Visit (HOSPITAL_BASED_OUTPATIENT_CLINIC_OR_DEPARTMENT_OTHER): Payer: Medicare Other | Admitting: *Deleted

## 2011-05-28 ENCOUNTER — Encounter (HOSPITAL_BASED_OUTPATIENT_CLINIC_OR_DEPARTMENT_OTHER)
Admission: RE | Disposition: A | Discharge status: Home / Self Care | Payer: Self-pay | Source: Ambulatory Visit | Attending: Orthopedic Surgery

## 2011-05-28 ENCOUNTER — Encounter (HOSPITAL_BASED_OUTPATIENT_CLINIC_OR_DEPARTMENT_OTHER): Payer: Self-pay

## 2011-05-28 ENCOUNTER — Ambulatory Visit (HOSPITAL_BASED_OUTPATIENT_CLINIC_OR_DEPARTMENT_OTHER)
Admission: RE | Admit: 2011-05-28 | Discharge: 2011-05-28 | Disposition: A | Discharge status: Home / Self Care | Payer: Medicare Other | Source: Ambulatory Visit | Attending: Orthopedic Surgery | Admitting: Orthopedic Surgery

## 2011-05-28 DIAGNOSIS — I10 Essential (primary) hypertension: Secondary | ICD-10-CM | POA: Insufficient documentation

## 2011-05-28 DIAGNOSIS — M2040 Other hammer toe(s) (acquired), unspecified foot: Secondary | ICD-10-CM | POA: Diagnosis not present

## 2011-05-28 DIAGNOSIS — I359 Nonrheumatic aortic valve disorder, unspecified: Secondary | ICD-10-CM | POA: Insufficient documentation

## 2011-05-28 DIAGNOSIS — M25579 Pain in unspecified ankle and joints of unspecified foot: Secondary | ICD-10-CM | POA: Diagnosis not present

## 2011-05-28 DIAGNOSIS — M6240 Contracture of muscle, unspecified site: Secondary | ICD-10-CM | POA: Insufficient documentation

## 2011-05-28 DIAGNOSIS — Q667 Congenital pes cavus, unspecified foot: Secondary | ICD-10-CM | POA: Diagnosis not present

## 2011-05-28 DIAGNOSIS — Z01812 Encounter for preprocedural laboratory examination: Secondary | ICD-10-CM | POA: Insufficient documentation

## 2011-05-28 DIAGNOSIS — Q6689 Other  specified congenital deformities of feet: Secondary | ICD-10-CM

## 2011-05-28 DIAGNOSIS — M24573 Contracture, unspecified ankle: Secondary | ICD-10-CM | POA: Diagnosis not present

## 2011-05-28 DIAGNOSIS — M205X9 Other deformities of toe(s) (acquired), unspecified foot: Secondary | ICD-10-CM | POA: Diagnosis not present

## 2011-05-28 DIAGNOSIS — I2510 Atherosclerotic heart disease of native coronary artery without angina pectoris: Secondary | ICD-10-CM | POA: Insufficient documentation

## 2011-05-28 DIAGNOSIS — G8918 Other acute postprocedural pain: Secondary | ICD-10-CM | POA: Diagnosis not present

## 2011-05-28 DIAGNOSIS — M24576 Contracture, unspecified foot: Secondary | ICD-10-CM | POA: Diagnosis not present

## 2011-05-28 HISTORY — PX: WEIL OSTEOTOMY: SHX5044

## 2011-05-28 HISTORY — DX: Unspecified osteoarthritis, unspecified site: M19.90

## 2011-05-28 HISTORY — DX: Other complications of anesthesia, initial encounter: T88.59XA

## 2011-05-28 HISTORY — DX: Adverse effect of unspecified anesthetic, initial encounter: T41.45XA

## 2011-05-28 HISTORY — DX: Encounter for other specified aftercare: Z51.89

## 2011-05-28 HISTORY — DX: Reserved for inherently not codable concepts without codable children: IMO0001

## 2011-05-28 HISTORY — PX: GASTROCNEMIUS RECESSION: SHX863

## 2011-05-28 SURGERY — RECESSION, MUSCLE, GASTROCNEMIUS
Anesthesia: General | Site: Leg Lower | Laterality: Right | Wound class: Clean

## 2011-05-28 MED ORDER — 0.9 % SODIUM CHLORIDE (POUR BTL) OPTIME
TOPICAL | Status: DC | PRN
  Administered 2011-05-28: 1000 mL

## 2011-05-28 MED ORDER — METOCLOPRAMIDE HCL 5 MG/ML IJ SOLN
INTRAMUSCULAR | Status: DC | PRN
  Administered 2011-05-28: 10 mg via INTRAVENOUS

## 2011-05-28 MED ORDER — ONDANSETRON HCL 4 MG/2ML IJ SOLN
INTRAMUSCULAR | Status: DC | PRN
  Administered 2011-05-28: 4 mg via INTRAVENOUS

## 2011-05-28 MED ORDER — METOCLOPRAMIDE HCL 5 MG/ML IJ SOLN: 10.0000 mg | Freq: Once | INTRAMUSCULAR | Status: DC | PRN

## 2011-05-28 MED ORDER — CHLORHEXIDINE GLUCONATE 4 % EX LIQD: 60.0000 mL | Freq: Once | CUTANEOUS | Status: DC

## 2011-05-28 MED ORDER — EPHEDRINE SULFATE 50 MG/ML IJ SOLN
INTRAMUSCULAR | Status: DC | PRN
  Administered 2011-05-28: 10 mg via INTRAVENOUS
  Administered 2011-05-28 (×3): 5 mg via INTRAVENOUS

## 2011-05-28 MED ORDER — CEFAZOLIN SODIUM-DEXTROSE 2-3 GM-% IV SOLR
2.0000 g | INTRAVENOUS | Status: AC
  Administered 2011-05-28: 2 g via INTRAVENOUS

## 2011-05-28 MED ORDER — DEXAMETHASONE SODIUM PHOSPHATE 10 MG/ML IJ SOLN
INTRAMUSCULAR | Status: DC | PRN
  Administered 2011-05-28: 4 mg via INTRAVENOUS

## 2011-05-28 MED ORDER — FENTANYL CITRATE 0.05 MG/ML IJ SOLN
50.0000 ug | INTRAMUSCULAR | Status: DC | PRN
  Administered 2011-05-28: 100 ug via INTRAVENOUS

## 2011-05-28 MED ORDER — OXYCODONE HCL 5 MG PO TABS: 5.0000 mg | ORAL_TABLET | ORAL | Status: AC | PRN

## 2011-05-28 MED ORDER — MIDAZOLAM HCL 2 MG/2ML IJ SOLN
0.5000 mg | INTRAMUSCULAR | Status: DC | PRN
  Administered 2011-05-28: 2 mg via INTRAVENOUS

## 2011-05-28 MED ORDER — SODIUM CHLORIDE 0.9 % IV SOLN: INTRAVENOUS | Status: DC

## 2011-05-28 MED ORDER — LIDOCAINE-EPINEPHRINE 1.5-1:200000 % IJ SOLN
INTRAMUSCULAR | Status: DC | PRN
  Administered 2011-05-28: 25 mL via INTRADERMAL

## 2011-05-28 MED ORDER — ROPIVACAINE HCL 5 MG/ML IJ SOLN
INTRAMUSCULAR | Status: DC | PRN
  Administered 2011-05-28: 25 mL via EPIDURAL

## 2011-05-28 MED ORDER — LIDOCAINE HCL (CARDIAC) 20 MG/ML IV SOLN
INTRAVENOUS | Status: DC | PRN
  Administered 2011-05-28: 100 mg via INTRAVENOUS

## 2011-05-28 MED ORDER — BACITRACIN ZINC 500 UNIT/GM EX OINT
TOPICAL_OINTMENT | CUTANEOUS | Status: DC | PRN
  Administered 2011-05-28: 1 via TOPICAL

## 2011-05-28 MED ORDER — FENTANYL CITRATE 0.05 MG/ML IJ SOLN: 25.0000 ug | INTRAMUSCULAR | Status: DC | PRN

## 2011-05-28 MED ORDER — LACTATED RINGERS IV SOLN
INTRAVENOUS | Status: DC
  Administered 2011-05-28 (×2): via INTRAVENOUS

## 2011-05-28 MED ORDER — MORPHINE SULFATE 10 MG/ML IJ SOLN: 0.0500 mg/kg | INTRAMUSCULAR | Status: DC | PRN

## 2011-05-28 MED ORDER — LIDOCAINE HCL 1 % IJ SOLN
INTRAMUSCULAR | Status: DC | PRN
  Administered 2011-05-28: 2 mL via INTRADERMAL

## 2011-05-28 MED ORDER — PROPOFOL 10 MG/ML IV EMUL
INTRAVENOUS | Status: DC | PRN
  Administered 2011-05-28: 250 mg via INTRAVENOUS

## 2011-05-28 SURGICAL SUPPLY — 80 items
0.54 KWIRE--STRYKER SHORT ×6 IMPLANT
BANDAGE CONFORM 2  STR LF (GAUZE/BANDAGES/DRESSINGS) IMPLANT
BANDAGE CONFORM 3  STR LF (GAUZE/BANDAGES/DRESSINGS) IMPLANT
BANDAGE ELASTIC 4 VELCRO ST LF (GAUZE/BANDAGES/DRESSINGS) IMPLANT
BANDAGE ESMARK 6X9 LF (GAUZE/BANDAGES/DRESSINGS) ×2 IMPLANT
BANDAGE GAUZE ELAST BULKY 4 IN (GAUZE/BANDAGES/DRESSINGS) IMPLANT
BIT DRILL 2.9 CANN QC NONSTRL (BIT) ×3 IMPLANT
BIT DRILL SOLID 1.5X85 (BIT) ×3 IMPLANT
BLADE AVERAGE 25X9 (BLADE) ×3 IMPLANT
BLADE SURG 15 STRL LF DISP TIS (BLADE) ×4 IMPLANT
BLADE SURG 15 STRL SS (BLADE) ×2
BNDG COHESIVE 4X5 TAN STRL (GAUZE/BANDAGES/DRESSINGS) ×3 IMPLANT
BNDG COHESIVE 6X5 TAN STRL LF (GAUZE/BANDAGES/DRESSINGS) ×3 IMPLANT
BNDG ESMARK 4X9 LF (GAUZE/BANDAGES/DRESSINGS) IMPLANT
BNDG ESMARK 6X9 LF (GAUZE/BANDAGES/DRESSINGS) ×3
CHLORAPREP W/TINT 26ML (MISCELLANEOUS) ×3 IMPLANT
CLOTH BEACON ORANGE TIMEOUT ST (SAFETY) ×3 IMPLANT
COVER TABLE BACK 60X90 (DRAPES) ×3 IMPLANT
CUFF TOURNIQUET SINGLE 18IN (TOURNIQUET CUFF) IMPLANT
CUFF TOURNIQUET SINGLE 34IN LL (TOURNIQUET CUFF) ×3 IMPLANT
DRAPE EXTREMITY T 121X128X90 (DRAPE) ×3 IMPLANT
DRAPE OEC MINIVIEW 54X84 (DRAPES) ×3 IMPLANT
DRAPE SURG 17X23 STRL (DRAPES) IMPLANT
DRAPE U-SHAPE 47X51 STRL (DRAPES) ×3 IMPLANT
DRSG EMULSION OIL 3X3 NADH (GAUZE/BANDAGES/DRESSINGS) ×3 IMPLANT
DRSG PAD ABDOMINAL 8X10 ST (GAUZE/BANDAGES/DRESSINGS) ×3 IMPLANT
ELECT REM PT RETURN 9FT ADLT (ELECTROSURGICAL) ×3
ELECTRODE REM PT RTRN 9FT ADLT (ELECTROSURGICAL) ×2 IMPLANT
GAUZE SPONGE 4X4 16PLY XRAY LF (GAUZE/BANDAGES/DRESSINGS) IMPLANT
GLOVE BIO SURGEON STRL SZ8 (GLOVE) ×6 IMPLANT
GLOVE BIOGEL PI IND STRL 8 (GLOVE) ×2 IMPLANT
GLOVE BIOGEL PI INDICATOR 8 (GLOVE) ×1
GLOVE ECLIPSE 6.5 STRL STRAW (GLOVE) ×3 IMPLANT
GOWN PREVENTION PLUS XLARGE (GOWN DISPOSABLE) ×3 IMPLANT
GOWN PREVENTION PLUS XXLARGE (GOWN DISPOSABLE) ×3 IMPLANT
K-WIRE ACE 1.6X6 (WIRE) ×3
KWIRE ACE 1.6X6 (WIRE) ×2 IMPLANT
NEEDLE FISTULA 1/2 CIRCLE (NEEDLE) ×3 IMPLANT
NEEDLE HYPO 22GX1.5 SAFETY (NEEDLE) IMPLANT
NEEDLE HYPO 25X1 1.5 SAFETY (NEEDLE) IMPLANT
NS IRRIG 1000ML POUR BTL (IV SOLUTION) ×3 IMPLANT
PACK BASIN DAY SURGERY FS (CUSTOM PROCEDURE TRAY) ×3 IMPLANT
PAD CAST 4YDX4 CTTN HI CHSV (CAST SUPPLIES) ×2 IMPLANT
PADDING CAST ABS 4INX4YD NS (CAST SUPPLIES) ×1
PADDING CAST ABS COTTON 4X4 ST (CAST SUPPLIES) ×2 IMPLANT
PADDING CAST COTTON 4X4 STRL (CAST SUPPLIES) ×1
PADDING CAST COTTON 6X4 STRL (CAST SUPPLIES) ×3 IMPLANT
PASSER SUT SWANSON 36MM LOOP (INSTRUMENTS) ×3 IMPLANT
PENCIL BUTTON HOLSTER BLD 10FT (ELECTRODE) ×3 IMPLANT
SCREW ACE CAN 4.0 40M (Screw) ×3 IMPLANT
SCREW CORTEX ST 2.0X14 (Screw) ×6 IMPLANT
SHEET MEDIUM DRAPE 40X70 STRL (DRAPES) ×6 IMPLANT
SPLINT FAST PLASTER 5X30 (CAST SUPPLIES)
SPLINT PLASTER CAST FAST 5X30 (CAST SUPPLIES) IMPLANT
SPONGE GAUZE 4X4 12PLY (GAUZE/BANDAGES/DRESSINGS) ×6 IMPLANT
SPONGE LAP 18X18 X RAY DECT (DISPOSABLE) ×3 IMPLANT
STOCKINETTE 6  STRL (DRAPES) ×1
STOCKINETTE 6 STRL (DRAPES) ×2 IMPLANT
STRIP CLOSURE SKIN 1/2X4 (GAUZE/BANDAGES/DRESSINGS) IMPLANT
SUCTION FRAZIER TIP 10 FR DISP (SUCTIONS) IMPLANT
SUT ETHILON 3 0 FSL (SUTURE) IMPLANT
SUT ETHILON 3 0 PS 1 (SUTURE) IMPLANT
SUT ETHILON 4 0 PS 2 18 (SUTURE) IMPLANT
SUT MERSILENE 2.0 SH NDLE (SUTURE) ×3 IMPLANT
SUT MNCRL AB 3-0 PS2 18 (SUTURE) ×3 IMPLANT
SUT MNCRL AB 4-0 PS2 18 (SUTURE) IMPLANT
SUT PROLENE 3 0 PS 2 (SUTURE) ×6 IMPLANT
SUT VIC AB 0 SH 27 (SUTURE) ×3 IMPLANT
SUT VIC AB 2-0 CT1 27 (SUTURE)
SUT VIC AB 2-0 CT1 TAPERPNT 27 (SUTURE) IMPLANT
SUT VIC AB 2-0 SH 18 (SUTURE) ×3 IMPLANT
SUT VIC AB 3-0 PS1 18 (SUTURE)
SUT VIC AB 3-0 PS1 18XBRD (SUTURE) IMPLANT
SYR BULB 3OZ (MISCELLANEOUS) ×3 IMPLANT
SYR CONTROL 10ML LL (SYRINGE) IMPLANT
TOWEL OR 17X24 6PK STRL BLUE (TOWEL DISPOSABLE) ×6 IMPLANT
TUBE CONNECTING 20X1/4 (TUBING) IMPLANT
UNDERPAD 30X30 INCONTINENT (UNDERPADS AND DIAPERS) ×3 IMPLANT
WATER STERILE IRR 1000ML POUR (IV SOLUTION) IMPLANT
YANKAUER SUCT BULB TIP NO VENT (SUCTIONS) IMPLANT

## 2011-05-28 NOTE — H&P (Signed)
Luis Herrera is an 71 y.o. male.   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  He presents now for operative correction having failed non-op treatment.   Past Medical History  Diagnosis Date  . Hyperlipidemia   . Hypertension   . Hx of colonic polyps   . CAD (coronary artery disease)   . Aortic stenosis   . Aortic valve disease   . Blood transfusion   . Arthritis   . Complication of anesthesia     hallucinated once    Past Surgical History  Procedure Date  . Aortic valve replacement   . Total hip replacement 2000    left  . Total knee arthroplasty 2000    left  . Cholecystectomy 1982  . Tonsillectomy   . Cardiac catheterization   . Cardiac valve replacement     aortic  . Correction hammer toe 2011    right  . Colonoscopy     Family History  Problem Relation Age of Onset  . Hypertension Other   . Hyperlipidemia Other    Social History:  reports that he quit smoking about 38 years ago. He does not have any smokeless tobacco history on file. He reports that he drinks alcohol. He reports that he does not use illicit drugs.  Allergies: No Known Allergies  No current facility-administered medications on file as of .   Medications Prior to Admission  Medication Sig Dispense Refill  . carvedilol (COREG) 6.25 MG tablet Take 6.25 mg by mouth 2 (two) times daily with a meal.      . aspirin 325 MG tablet Take 325 mg by mouth daily.        Marland Kitchen atorvastatin (LIPITOR) 80 MG tablet Take 1 tablet (80 mg total) by mouth daily.  90 tablet  3  . furosemide (LASIX) 40 MG tablet Take 40 mg by mouth daily.        . meloxicam (MOBIC) 15 MG tablet Take 15 mg by mouth daily.        Marland Kitchen olmesartan-hydrochlorothiazide (BENICAR HCT) 40-12.5 MG per tablet Take 1 tablet by mouth daily.        . traMADol (ULTRAM) 50 MG tablet Take 1 tablet (50 mg total) by mouth every 6 (six) hours as needed for pain.  50 tablet  5     Results for orders placed during the hospital encounter of 05/28/11 (from the past 48 hour(s))  BASIC METABOLIC PANEL     Status: Abnormal   Collection Time   05/26/11 12:30 PM      Component Value Range Comment   Sodium 140  135 - 145 (mEq/L)    Potassium 4.7  3.5 - 5.1 (mEq/L) HEMOLYSIS AT THIS LEVEL MAY AFFECT RESULT   Chloride 104  96 - 112 (mEq/L)    CO2 24  19 - 32 (mEq/L)    Glucose, Bld 104 (*) 70 - 99 (mg/dL)    BUN 30 (*) 6 - 23 (mg/dL)    Creatinine, Ser 1.61  0.50 - 1.35 (mg/dL)    Calcium 9.5  8.4 - 10.5 (mg/dL)    GFR calc non Af Amer 68 (*) >90 (mL/min)    GFR calc Af Amer 79 (*) >90 (mL/min)    No results found.  ROS  No recent f/c/n/v/wt loss.  Height 5\' 11"  (1.803 m), weight 102.059 kg (225 lb). Physical Exam wn wd male innad  A and O x 4.  Mood  and affect normal.  EOMI.  Resp unlabored.  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  Skin intact.  Hallux in a claw position.  5/5 strength.  Normal sens to LT.  Pulses palpable.  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  The risks and benefits of the alternative treatment options have been discussed in detail.  The patient wishes to proceed with surgery and specifically understands risks of bleeding, infection, nerve damage, blood clots, need for additional surgery, amputation and death.   Toni Arthurs 2011-06-05, 7:22 AM

## 2011-05-28 NOTE — Brief Op Note (Signed)
05/28/2011  11:34 AM  PATIENT:  Luis Herrera  71 y.o. 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  Right gastrocnemius recession 2.  Right hallux Jones procedure 3.  Right 2nd MT weil osteotomy 4.  Right 3rd MT weil osteotomy 5.  Right 2nd dorsal capsuolotomy with extensor tendon lengthening 6.  Right 3rd dorsal capsulotomy with extensor tendon lengthening. 7.  Right EHB tendon lengthening 8.  Right 1st MTP dorsal capsulotomy 9.  fluoro  SURGEON:  Toni Arthurs, MD  ASSISTANT: n/a  ANESTHESIA:   General, regional  EBL:  minimal   TOURNIQUET:   Total Tourniquet Time Documented: Thigh (Right) - 90 minutes  COMPLICATIONS:  None apparent  DISPOSITION:  Extubated, awake and stable to recovery.  DICTATION ID:  [id number]

## 2011-05-28 NOTE — Transfer of Care (Signed)
Immediate Anesthesia Transfer of Care Note  Patient: Luis Herrera  Procedure(s) Performed: Procedure(s) (LRB): GASTROCNEMIUS SLIDE (Right) WEIL OSTEOTOMY (Right)  Patient Location: PACU  Anesthesia Type: GA combined with regional for post-op pain  Level of Consciousness: awake, alert , oriented and patient cooperative  Airway & Oxygen Therapy: Patient Spontanous Breathing and Patient connected to face mask oxygen  Post-op Assessment: Report given to PACU RN, Post -op Vital signs reviewed and stable and Patient moving all extremities  Post vital signs: Reviewed and stable  Complications: No apparent anesthesia complications

## 2011-05-28 NOTE — Anesthesia Procedure Notes (Addendum)
Anesthesia Regional Block:  Popliteal block  Pre-Anesthetic Checklist: ,, timeout performed, Correct Patient, Correct Site, Correct Laterality, Correct Procedure, Correct Position, site marked, Risks and benefits discussed,  Surgical consent,  Pre-op evaluation,  At surgeon's request and post-op pain management  Laterality: Right  Prep: chloraprep       Needles:   Needle Type: Other   (Arrow Echogenic)   Needle Length: 9cm  Needle Gauge: 21    Additional Needles:  Procedures: ultrasound guided Popliteal block Narrative:  Start time: 05/28/2011 9:20 AM End time: 05/28/2011 9:28 AM Injection made incrementally with aspirations every 5 mL.  Performed by: Personally  Anesthesiologist: Aldona Lento, MD  Additional Notes: Ultrasound guidance used to: id relevant anatomy, confirm needle position, local anesthetic spread, avoidance of vascular puncture. Picture saved. No complications. Block performed personally by Janetta Hora. Gelene Mink, MD  .    Popliteal block Procedure Name: LMA Insertion Date/Time: 05/28/2011 9:47 AM Performed by: Meyer Russel Pre-anesthesia Checklist: Patient identified, Emergency Drugs available, Suction available and Patient being monitored Patient Re-evaluated:Patient Re-evaluated prior to inductionOxygen Delivery Method: Circle System Utilized Preoxygenation: Pre-oxygenation with 100% oxygen Intubation Type: IV induction Ventilation: Mask ventilation without difficulty LMA: LMA with gastric port inserted LMA Size: 5.0 Number of attempts: 1 Placement Confirmation: positive ETCO2 and breath sounds checked- equal and bilateral Tube secured with: Tape Dental Injury: Teeth and Oropharynx as per pre-operative assessment

## 2011-05-28 NOTE — Anesthesia Preprocedure Evaluation (Addendum)
Anesthesia Evaluation  Patient identified by MRN, date of birth, ID band Patient awake    Reviewed: Allergy & Precautions, H&P , NPO status , Patient's Chart, lab work & pertinent test results, reviewed documented beta blocker date and time   History of Anesthesia Complications (+) PROLONGED EMERGENCE  Airway Mallampati: II TM Distance: >3 FB Neck ROM: full    Dental   Pulmonary neg pulmonary ROS,          Cardiovascular hypertension, + CAD and + Past MI + Valvular Problems/Murmurs AS     Neuro/Psych negative neurological ROS  negative psych ROS   GI/Hepatic negative GI ROS, Neg liver ROS,   Endo/Other  negative endocrine ROS  Renal/GU negative Renal ROS  negative genitourinary   Musculoskeletal   Abdominal   Peds  Hematology negative hematology ROS (+)   Anesthesia Other Findings See surgeon's H&P   Reproductive/Obstetrics negative OB ROS                           Anesthesia Physical Anesthesia Plan  ASA: III  Anesthesia Plan: General   Post-op Pain Management:    Induction: Intravenous  Airway Management Planned: LMA  Additional Equipment:   Intra-op Plan:   Post-operative Plan: Extubation in OR  Informed Consent: I have reviewed the patients History and Physical, chart, labs and discussed the procedure including the risks, benefits and alternatives for the proposed anesthesia with the patient or authorized representative who has indicated his/her understanding and acceptance.     Plan Discussed with: CRNA and Surgeon  Anesthesia Plan Comments:         Anesthesia Quick Evaluation

## 2011-05-28 NOTE — Discharge Instructions (Addendum)
Toni Arthurs, MD [facility]  Please read the following information regarding your care after surgery.  Medications  You only need a prescription for the narcotic pain medicine (ex. oxycodone, Percocet, Norco).  All of the other medicines listed below are available over the counter. X acetominophen (Tylenol) 650 mg every 4-6 hours as you need for minor pain X oxycodone as prescribed for moderate to severe pain ?   Narcotic pain medicine (ex. oxycodone, Percocet, Vicodin) will cause constipation.  To prevent this problem, take the following medicines while you are taking any pain medicine. X docusate sodium (Colace) 100 mg twice a day X senna (Senokot) 2 tablets twice a day  X To help prevent blood clots, take an aspirin (325 mg) once a day for a month after surgery.  You should also get up every hour while you are awake to move around.    Weight Bearing ? Bear weight when you are able on your operated leg or foot. X Bear weight only on the heel of your operated foot in the post-op boot. ? Do not bear any weight on the operated leg or foot.  Cast / Splint / Dressing ? Keep your boot clean and dry.  Don't put anything (coat hanger, pencil, etc) down inside of it.  If it gets damp, use a hair dryer on the cool setting to dry it.  ? Remove your dressing 3 days after surgery and cover the incisions with dry dressings.    After your dressing, cast or splint is removed; you may shower, but do not soak or scrub the wound.  Allow the water to run over it, and then gently pat it dry.  Swelling It is normal for you to have swelling where you had surgery.  To reduce swelling and pain, keep your toes above your nose for at least 3 days after surgery.  It may be necessary to keep your foot or leg elevated for several weeks.  If it hurts, it should be elevated.  Follow Up Call my office at [phone] when you are discharged from the hospital or surgery center to schedule an appointment  to be seen two weeks after surgery.  Call my office at [phone] if you develop a fever >101.5 F, nausea, vomiting, bleeding from the surgical site or severe pain.     Post Anesthesia Home Care Instructions  Activity: Get plenty of rest for the remainder of the day. A responsible adult should stay with you for 24 hours following the procedure.  For the next 24 hours, DO NOT: -Drive a car -Advertising copywriter -Drink alcoholic beverages -Take any medication unless instructed by your physician -Make any legal decisions or sign important papers.  Meals: Start with liquid foods such as gelatin or soup. Progress to regular foods as tolerated. Avoid greasy, spicy, heavy foods. If nausea and/or vomiting occur, drink only clear liquids until the nausea and/or vomiting subsides. Call your physician if vomiting continues.  Special Instructions/Symptoms: Your throat may feel dry or sore from the anesthesia or the breathing tube placed in your throat during surgery. If this causes discomfort, gargle with warm salt water. The discomfort should disappear within 24 hours.

## 2011-05-28 NOTE — Progress Notes (Signed)
Assisted Dr. Frederick with right, ultrasound guided, popliteal/saphenous block. Side rails up, monitors on throughout procedure. See vital signs in flow sheet. Tolerated Procedure well. 

## 2011-05-28 NOTE — Anesthesia Postprocedure Evaluation (Signed)
Anesthesia Post Note  Patient: Luis Herrera  Procedure(s) Performed: Procedure(s) (LRB): GASTROCNEMIUS SLIDE (Right) WEIL OSTEOTOMY (Right)  Anesthesia type: General  Patient location: PACU  Post pain: Pain level controlled  Post assessment: Patient's Cardiovascular Status Stable  Last Vitals:  Filed Vitals:   05/28/11 1250  BP:   Pulse: 56  Temp:   Resp: 13    Post vital signs: Reviewed and stable  Level of consciousness: alert  Complications: No apparent anesthesia complications

## 2011-05-29 NOTE — Op Note (Signed)
NAME:  Luis Herrera, Luis Herrera                ACCOUNT NO.:  [id number]  MEDICAL RECORD NO.:  [id number]  LOCATION:                                 FACILITY:  PHYSICIAN:  Toni Arthurs, MD             DATE OF BIRTH:  DATE OF PROCEDURE: DATE OF DISCHARGE:                              OPERATIVE REPORT   PREOPERATIVE DIAGNOSES: 1. Right gastrocnemius contracture. 2. Right hallux claw deformity. 3. 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  POSTOPERATIVE DIAGNOSES: 1. Right gastrocnemius contracture. 2. Right hallux claw deformity. 3. 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 4. Extensor hallucis brevis and dorsal first metatarsophalangeal joint     contractures.  PROCEDURES: 1. Right gastrocnemius recession. 2. Right hallux and first metatarsal Jones procedure. 3. Right second metatarsal Weil osteotomy. 4. Right third metatarsal Weil osteotomy. 5. Right second MTP joint dorsal capsulotomy with extensor tendon     lengthening. 6. Right third dorsal MTP joint capsulotomy with extensor tendon     lengthening. 7. Right extensor hallucis brevis tendon lengthening. 8. Right first MTP joint dorsal capsulotomy. 9. Intraoperative interpretation of fluoroscopic imaging.  SURGEON:  Toni Arthurs, MD  ANESTHESIA:  General.  ESTIMATED BLOOD LOSS:  Minimal.  TOURNIQUET TIME:  1 hour and 30 minutes at 225 mmHg.  COMPLICATIONS:  None apparent.  DISPOSITION:  Extubated, awake, and stable to recovery.  INDICATIONS FOR PROCEDURE:  The patient is a 71 year old male who had surgery on his right foot approximately 2 years ago with correction of the second, third and fourth hammertoe deformities.  He has since developed painful plantar calluses and recurrence of the hammertoe deformities as well as  a claw deformity of the hallux.  On physical exam, he has a gastrocnemius recession compounding these overload conditions at the second and third metatarsals.  He presents now for operative treatment of these conditions.  He understands the risks and benefits of the alternative treatment options and elects surgical treatment.  He specifically understands risks of bleeding, infection, nerve damage, blood clots, need for additional surgery, amputation, and death.  PROCEDURE IN DETAIL:  After preoperative consent was obtained and the correct operative site was identified, the patient was brought to the operating room and placed supine on the operating table.  General anesthesia was induced.  Preoperative antibiotics were administered. Surgical time-out was taken.  The right lower extremity was prepped and draped in standard sterile fashion with tourniquet around the thigh. The extremity was exsanguinated and tourniquet was inflated to 225 mmHg. A longitudinal incision was made on the medial aspect of the leg.  Sharp dissection was carried down through the subcutaneous tissue.  The plantaris tendon was identified and transected under direct vision.  The gastrocnemius tendon was similarly identified and transected under direct vision.  Care was taken to protect the sural nerve.  The ankle would dorsiflex approximately 20 degrees with the knee extended after releasing the gastrocnemius tendon.  The wound was irrigated copiously. The incision was closed with Monocryl of the subcutaneous layer and a running 3-0 Prolene of the skin layer.  Attention was then turned to the hallux.  A transverse incision was made over the IP joint.  Sharp dissection was carried down through the skin. The EHL tendon was released from its insertion at the distal phalanx.  A longitudinal incision was then made over the neck of the metatarsal. The EHB tendon was isolated and withdrawn through this tendon. Attention  was then returned to the IP joint.  An oscillating saw was used to resect the head of the proximal phalanx biasing the cut to take a greater amount of bone medially to correct the hallux valgus interphalangeus.  The base of the distal phalanx was similarly resected. A K-wire was inserted across the joint from the tip of the toe into the proximal phalanx.  This was overdrilled with a 2.9-mm cannulated drill bit, and a 4-mm partially-threaded cannulated screw was inserted compressing the osteotomy site appropriately.  AP and lateral fluoroscopic views showed appropriate reduction of the joint and appropriate position and length of the screw.  The dorsal wound was irrigated and closed with a running 3-0 Prolene.  Incision at the tip of the toe was closed with a 3-0 Prolene horizontal mattress suture. Attention was then turned to the proximal incision where the neck of the metatarsal was exposed.  The extensor hallucis brevis tendon was still noted to be quite tight as was the dorsal joint capsule.  A dorsal capsulotomy was then performed allowing the toe to plantarflex to neutral; however, the extensor hallucis brevis tendon was still quite tight, this was lengthened in Z fashion and repaired allowing the toe to reach a plantigrade position.  The neck of the metatarsal was exposed. A 2.9-mm hole was made with the drill bit transverse to the neck.  A suture passer was used to retrieve the EHL and pull it through this hole, it was then repaired to itself on the dorsal aspect of the metatarsal neck.  The wound was then irrigated copiously.  Inverted simple sutures of 3-0 Monocryl were used to close the subcutaneous tissue and a running 3-0 Prolene was used to close the skin incision.  Attention was then turned to the previous incisions on the dorsum of the second and third MTP joints.  Both incisions were made again and extended proximally.  The second hallucis brevis and extensor  hallucis longus tendon was exposed.  The EHB tendon was released and the extensor hallucis longus tendon was lengthened in Z fashion.  A dorsal capsulotomy was performed.  A Weil osteotomy was then performed and the head of the metatarsal was allowed to retract several millimeters.  It was fixed with a unicortical 2-mm screw, overhanging bone was resected with a rongeur.  The same procedure was performed for the third MTP joint shortening the metatarsal several millimeters and releasing the EHB and lengthening the EHL.  A dorsal capsulotomy was also performed. Both wounds were irrigated copiously.  Inverted simple sutures of 3-0 Monocryl were used to close the subcutaneous tissue and a running 3-0 Prolene was used to close both skin incisions.  Sterile dressings were applied followed by a well-padded compression wrap.  The tourniquet was released at 90 minutes.  The patient was awakened from anesthesia and transported to the recovery room in stable condition.  FOLLOWUP PLAN:  The patient will be weightbearing as tolerated, then a Cam walker boot.  He will  follow up with me in 2 weeks for suture removal and follow up x-rays.     Toni Arthurs, MD     JH/MEDQ  D:  05/28/2011  T:  05/29/2011  Job:  [id number]

## 2011-06-01 ENCOUNTER — Encounter (HOSPITAL_BASED_OUTPATIENT_CLINIC_OR_DEPARTMENT_OTHER): Payer: Self-pay | Admitting: Orthopedic Surgery

## 2011-06-26 DIAGNOSIS — L851 Acquired keratosis [keratoderma] palmaris et plantaris: Secondary | ICD-10-CM | POA: Diagnosis not present

## 2011-06-29 ENCOUNTER — Ambulatory Visit (INDEPENDENT_AMBULATORY_CARE_PROVIDER_SITE_OTHER): Payer: Medicare Other | Admitting: Internal Medicine

## 2011-06-29 ENCOUNTER — Other Ambulatory Visit (INDEPENDENT_AMBULATORY_CARE_PROVIDER_SITE_OTHER): Payer: Medicare Other

## 2011-06-29 ENCOUNTER — Encounter: Payer: Self-pay | Admitting: Internal Medicine

## 2011-06-29 VITALS — BP 120/72 | HR 44 | Temp 97.6°F | Ht 66.0 in | Wt 220.4 lb

## 2011-06-29 DIAGNOSIS — I498 Other specified cardiac arrhythmias: Secondary | ICD-10-CM | POA: Diagnosis not present

## 2011-06-29 DIAGNOSIS — R55 Syncope and collapse: Secondary | ICD-10-CM | POA: Diagnosis not present

## 2011-06-29 DIAGNOSIS — R001 Bradycardia, unspecified: Secondary | ICD-10-CM

## 2011-06-29 LAB — CBC WITH DIFFERENTIAL/PLATELET
Basophils Absolute: 0 10*3/uL (ref 0.0–0.1)
Eosinophils Relative: 4 % (ref 0.0–5.0)
Hemoglobin: 13 g/dL (ref 13.0–17.0)
Lymphocytes Relative: 20.8 % (ref 12.0–46.0)
Monocytes Relative: 10 % (ref 3.0–12.0)
Platelets: 163 10*3/uL (ref 150.0–400.0)
RDW: 14.5 % (ref 11.5–14.6)
WBC: 6.9 10*3/uL (ref 4.5–10.5)

## 2011-06-29 LAB — BASIC METABOLIC PANEL
BUN: 25 mg/dL — ABNORMAL HIGH (ref 6–23)
Calcium: 9.1 mg/dL (ref 8.4–10.5)
GFR: 87.5 mL/min (ref >60.00)
Glucose, Bld: 103 mg/dL — ABNORMAL HIGH (ref 70–99)
Potassium: 4.4 mEq/L (ref 3.5–5.1)
Sodium: 135 mEq/L (ref 135–145)

## 2011-06-29 NOTE — Progress Notes (Signed)
  Subjective:    Patient ID: Luis Herrera, male    DOB: 1940-04-24, 71 y.o.   MRN: [id number]  HPI  complains of light headed feeling Transient, lasts only few minutes at a time Onset 4 days ago - clammy while ortho removed stiches from RLE (surgery 2 weeks ago) symptoms usually orthostatic - worse moving from sitting to standing in past 3 days Denies syncope or chest pain  - no headache  Call to his cards (Dr Anner Crete - [facility]) this AM, advised follow up here   Past Medical History  Diagnosis Date  . Hyperlipidemia   . Hypertension   . Hx of colonic polyps   . CAD (coronary artery disease)   . Aortic stenosis   . Aortic valve disease   . Blood transfusion   . Arthritis   . Complication of anesthesia     hallucinated once    Review of Systems  Constitutional: Negative for fever. Unexpected weight change: intentional 20# wt loss.  HENT: Negative for neck pain.   Respiratory: Negative for cough, shortness of breath and wheezing.   Cardiovascular: Negative for chest pain, palpitations and leg swelling.  Neurological: Negative for seizures, facial asymmetry, speech difficulty and numbness.       Objective:   Physical Exam BP 120/72  Pulse 44  Temp(Src) 97.6 F (36.4 C) (Oral)  Ht 5\' 6"  (1.676 m)  Wt 220 lb 6.4 oz (99.973 kg)  BMI 35.57 kg/m2  SpO2 96% Wt Readings from Last 3 Encounters:  06/29/11 220 lb 6.4 oz (99.973 kg)  05/26/11 225 lb (102.059 kg)  05/26/11 225 lb (102.059 kg)   Constitutional:  He appears well-developed and well-nourished. No distress.  Neck: Normal range of motion. Neck supple. No JVD present. No thyromegaly present.  Cardiovascular: Normal rate, regular rhythm and normal heart sounds.  No murmur heard. no BLE edema Pulmonary/Chest: Effort normal and breath sounds normal. No respiratory distress. no wheezes.  Neurological: he is alert and oriented to person, place, and time. No cranial nerve deficit. Coordination normal.  Skin: Skin is warm and  dry.  No erythema or ulceration.  Psychiatric: he has a normal mood and affect. behavior is normal. Judgment and thought content normal.   Lab Results  Component Value Date   WBC 8.6 09/01/2010   HGB 14.1 05/28/2011   HCT 40.9 09/01/2010   PLT 166.0 09/01/2010   GLUCOSE 104* 05/26/2011   CHOL 183 09/01/2010   TRIG 133.0 09/01/2010   HDL 51.10 09/01/2010   LDLCALC 105* 09/01/2010   ALT 27 09/01/2010   AST 29 09/01/2010   NA 140 05/26/2011   K 4.7 05/26/2011   CL 104 05/26/2011   CREATININE 1.07 05/26/2011   BUN 30* 05/26/2011   CO2 24 05/26/2011   TSH 3.49 09/01/2010   PSA 0.98 09/01/2010   ECG - NSR @ 70 bpm - RBBB without change     Assessment & Plan:  Near syncope -  ?transient bradycardia or overtreated hypertension in setting of intentional weight loss  EKG today - no acute changes, compared to preop 05/28/11 - chronic RBBB Check labs Hold Coreg and Benicar HCT for next 72h and recommended follow up at cards office (cards=[facility]) prior to leaving town ([place] on Friday)

## 2011-06-29 NOTE — Patient Instructions (Signed)
It was good to see you today. We have reviewed your prior records including labs and tests today Stop Coreg and BenicarHCT at this time until further notice Test(s) ordered today. Your results will be called to you after review (48-72hours after test completion). If any changes need to be made, you will be notified at that time. follow up with Dr Ambrose Mantle this week to review blood pressure and symptoms prior to leaving town - I will send her copy of our visit and tests today after review

## 2011-07-17 DIAGNOSIS — E663 Overweight: Secondary | ICD-10-CM | POA: Diagnosis not present

## 2011-07-17 DIAGNOSIS — I4510 Unspecified right bundle-branch block: Secondary | ICD-10-CM | POA: Diagnosis not present

## 2011-07-17 DIAGNOSIS — I10 Essential (primary) hypertension: Secondary | ICD-10-CM | POA: Diagnosis not present

## 2011-07-17 DIAGNOSIS — E7800 Pure hypercholesterolemia, unspecified: Secondary | ICD-10-CM | POA: Diagnosis not present

## 2011-07-17 DIAGNOSIS — R609 Edema, unspecified: Secondary | ICD-10-CM | POA: Diagnosis not present

## 2011-07-17 DIAGNOSIS — Z09 Encounter for follow-up examination after completed treatment for conditions other than malignant neoplasm: Secondary | ICD-10-CM | POA: Diagnosis not present

## 2011-07-17 DIAGNOSIS — I359 Nonrheumatic aortic valve disorder, unspecified: Secondary | ICD-10-CM | POA: Diagnosis not present

## 2011-07-24 DIAGNOSIS — L851 Acquired keratosis [keratoderma] palmaris et plantaris: Secondary | ICD-10-CM | POA: Diagnosis not present

## 2011-07-24 DIAGNOSIS — M79609 Pain in unspecified limb: Secondary | ICD-10-CM | POA: Diagnosis not present

## 2011-09-18 DIAGNOSIS — M205X9 Other deformities of toe(s) (acquired), unspecified foot: Secondary | ICD-10-CM | POA: Diagnosis not present

## 2011-09-18 DIAGNOSIS — M2040 Other hammer toe(s) (acquired), unspecified foot: Secondary | ICD-10-CM | POA: Diagnosis not present

## 2011-10-20 NOTE — Progress Notes (Signed)
Pt had his rt foot done here 4/13 with cardiac clearance-did well-did not have to stay overnight. To come in for bmet

## 2011-10-21 ENCOUNTER — Encounter (HOSPITAL_BASED_OUTPATIENT_CLINIC_OR_DEPARTMENT_OTHER)
Admission: RE | Admit: 2011-10-21 | Discharge: 2011-10-21 | Disposition: A | Discharge status: Home / Self Care | Payer: Medicare Other | Source: Ambulatory Visit | Attending: Orthopedic Surgery | Admitting: Orthopedic Surgery

## 2011-10-21 DIAGNOSIS — I10 Essential (primary) hypertension: Secondary | ICD-10-CM | POA: Diagnosis not present

## 2011-10-21 DIAGNOSIS — M2040 Other hammer toe(s) (acquired), unspecified foot: Secondary | ICD-10-CM | POA: Diagnosis not present

## 2011-10-21 DIAGNOSIS — M205X9 Other deformities of toe(s) (acquired), unspecified foot: Secondary | ICD-10-CM | POA: Diagnosis not present

## 2011-10-21 DIAGNOSIS — M24573 Contracture, unspecified ankle: Secondary | ICD-10-CM | POA: Diagnosis not present

## 2011-10-21 DIAGNOSIS — M6240 Contracture of muscle, unspecified site: Secondary | ICD-10-CM | POA: Diagnosis not present

## 2011-10-21 DIAGNOSIS — Z01812 Encounter for preprocedural laboratory examination: Secondary | ICD-10-CM | POA: Diagnosis not present

## 2011-10-21 LAB — BASIC METABOLIC PANEL
CO2: 31 mEq/L (ref 19–32)
Chloride: 100 mEq/L (ref 96–112)
Glucose, Bld: 97 mg/dL (ref 70–99)
Sodium: 139 mEq/L (ref 135–145)

## 2011-10-22 ENCOUNTER — Encounter (HOSPITAL_BASED_OUTPATIENT_CLINIC_OR_DEPARTMENT_OTHER): Payer: Self-pay | Admitting: *Deleted

## 2011-10-22 ENCOUNTER — Encounter (HOSPITAL_BASED_OUTPATIENT_CLINIC_OR_DEPARTMENT_OTHER)
Admission: RE | Disposition: A | Discharge status: Home / Self Care | Payer: Self-pay | Source: Ambulatory Visit | Attending: Orthopedic Surgery

## 2011-10-22 ENCOUNTER — Encounter (HOSPITAL_BASED_OUTPATIENT_CLINIC_OR_DEPARTMENT_OTHER): Payer: Self-pay | Admitting: Certified Registered Nurse Anesthetist

## 2011-10-22 ENCOUNTER — Ambulatory Visit (HOSPITAL_BASED_OUTPATIENT_CLINIC_OR_DEPARTMENT_OTHER): Payer: Medicare Other | Admitting: Certified Registered Nurse Anesthetist

## 2011-10-22 ENCOUNTER — Ambulatory Visit (HOSPITAL_BASED_OUTPATIENT_CLINIC_OR_DEPARTMENT_OTHER)
Admission: RE | Admit: 2011-10-22 | Discharge: 2011-10-22 | Disposition: A | Discharge status: Home / Self Care | Payer: Medicare Other | Source: Ambulatory Visit | Attending: Orthopedic Surgery | Admitting: Orthopedic Surgery

## 2011-10-22 DIAGNOSIS — G8918 Other acute postprocedural pain: Secondary | ICD-10-CM | POA: Diagnosis not present

## 2011-10-22 DIAGNOSIS — M25579 Pain in unspecified ankle and joints of unspecified foot: Secondary | ICD-10-CM | POA: Diagnosis not present

## 2011-10-22 DIAGNOSIS — M2040 Other hammer toe(s) (acquired), unspecified foot: Secondary | ICD-10-CM | POA: Diagnosis not present

## 2011-10-22 DIAGNOSIS — M6240 Contracture of muscle, unspecified site: Secondary | ICD-10-CM | POA: Insufficient documentation

## 2011-10-22 DIAGNOSIS — M205X9 Other deformities of toe(s) (acquired), unspecified foot: Secondary | ICD-10-CM | POA: Diagnosis not present

## 2011-10-22 DIAGNOSIS — I10 Essential (primary) hypertension: Secondary | ICD-10-CM | POA: Insufficient documentation

## 2011-10-22 DIAGNOSIS — M19079 Primary osteoarthritis, unspecified ankle and foot: Secondary | ICD-10-CM | POA: Diagnosis not present

## 2011-10-22 DIAGNOSIS — Z01812 Encounter for preprocedural laboratory examination: Secondary | ICD-10-CM | POA: Insufficient documentation

## 2011-10-22 DIAGNOSIS — M24573 Contracture, unspecified ankle: Secondary | ICD-10-CM | POA: Insufficient documentation

## 2011-10-22 SURGERY — HAMMERTOE RECONSTRUCTION WITH WEIL OSTEOTOMY AND GASTROC SLIDE
Anesthesia: General | Site: Foot | Laterality: Left | Wound class: Clean

## 2011-10-22 MED ORDER — BACITRACIN ZINC 500 UNIT/GM EX OINT
TOPICAL_OINTMENT | CUTANEOUS | Status: DC | PRN
  Administered 2011-10-22: 1 via TOPICAL

## 2011-10-22 MED ORDER — LACTATED RINGERS IV SOLN
INTRAVENOUS | Status: DC
  Administered 2011-10-22 (×2): via INTRAVENOUS

## 2011-10-22 MED ORDER — FENTANYL CITRATE 0.05 MG/ML IJ SOLN
50.0000 ug | INTRAMUSCULAR | Status: DC | PRN
  Administered 2011-10-22: 100 ug via INTRAVENOUS

## 2011-10-22 MED ORDER — METOCLOPRAMIDE HCL 5 MG/ML IJ SOLN: 10.0000 mg | Freq: Once | INTRAMUSCULAR | Status: DC | PRN

## 2011-10-22 MED ORDER — CHLORHEXIDINE GLUCONATE 4 % EX LIQD: 60.0000 mL | Freq: Once | CUTANEOUS | Status: DC

## 2011-10-22 MED ORDER — SODIUM CHLORIDE 0.9 % IV SOLN: INTRAVENOUS | Status: DC

## 2011-10-22 MED ORDER — ONDANSETRON HCL 4 MG/2ML IJ SOLN
INTRAMUSCULAR | Status: DC | PRN
  Administered 2011-10-22: 4 mg via INTRAVENOUS

## 2011-10-22 MED ORDER — EPHEDRINE SULFATE 50 MG/ML IJ SOLN
INTRAMUSCULAR | Status: DC | PRN
  Administered 2011-10-22: 10 mg via INTRAVENOUS

## 2011-10-22 MED ORDER — OXYCODONE HCL 5 MG PO TABS: 5.0000 mg | ORAL_TABLET | ORAL | Status: AC | PRN

## 2011-10-22 MED ORDER — OXYCODONE HCL 5 MG PO TABS: 5.0000 mg | ORAL_TABLET | Freq: Once | ORAL | Status: DC | PRN

## 2011-10-22 MED ORDER — DEXAMETHASONE SODIUM PHOSPHATE 10 MG/ML IJ SOLN
INTRAMUSCULAR | Status: DC | PRN
  Administered 2011-10-22: 10 mg via INTRAVENOUS

## 2011-10-22 MED ORDER — 0.9 % SODIUM CHLORIDE (POUR BTL) OPTIME
TOPICAL | Status: DC | PRN
  Administered 2011-10-22: 200 mL

## 2011-10-22 MED ORDER — HYDROMORPHONE HCL PF 1 MG/ML IJ SOLN: 0.2500 mg | INTRAMUSCULAR | Status: DC | PRN

## 2011-10-22 MED ORDER — MIDAZOLAM HCL 2 MG/2ML IJ SOLN
1.0000 mg | INTRAMUSCULAR | Status: DC | PRN
  Administered 2011-10-22: 2 mg via INTRAVENOUS

## 2011-10-22 MED ORDER — LIDOCAINE HCL (CARDIAC) 20 MG/ML IV SOLN
INTRAVENOUS | Status: DC | PRN
  Administered 2011-10-22: 30 mg via INTRAVENOUS

## 2011-10-22 MED ORDER — OXYCODONE HCL 5 MG/5ML PO SOLN: 5.0000 mg | Freq: Once | ORAL | Status: DC | PRN

## 2011-10-22 MED ORDER — PROPOFOL 10 MG/ML IV BOLUS
INTRAVENOUS | Status: DC | PRN
  Administered 2011-10-22: 200 mg via INTRAVENOUS
  Administered 2011-10-22: 10 mg via INTRAVENOUS

## 2011-10-22 MED ORDER — CEFAZOLIN SODIUM-DEXTROSE 2-3 GM-% IV SOLR
2.0000 g | INTRAVENOUS | Status: AC
  Administered 2011-10-22: 2 g via INTRAVENOUS

## 2011-10-22 SURGICAL SUPPLY — 76 items
2.0 DRILL BIT - STRYKER ×2 IMPLANT
BANDAGE CONFORM 2  STR LF (GAUZE/BANDAGES/DRESSINGS) IMPLANT
BANDAGE CONFORM 3  STR LF (GAUZE/BANDAGES/DRESSINGS) ×2 IMPLANT
BANDAGE ELASTIC 4 VELCRO ST LF (GAUZE/BANDAGES/DRESSINGS) ×2 IMPLANT
BANDAGE ELASTIC 6 VELCRO ST LF (GAUZE/BANDAGES/DRESSINGS) ×2 IMPLANT
BANDAGE ESMARK 6X9 LF (GAUZE/BANDAGES/DRESSINGS) ×1 IMPLANT
BANDAGE GAUZE 4  KLING STR (GAUZE/BANDAGES/DRESSINGS) IMPLANT
BANDAGE GAUZE ELAST BULKY 4 IN (GAUZE/BANDAGES/DRESSINGS) IMPLANT
BLADE AVERAGE 25X9 (BLADE) ×2 IMPLANT
BLADE OSC/SAG .038X5.5 CUT EDG (BLADE) IMPLANT
BLADE SURG 15 STRL LF DISP TIS (BLADE) ×4 IMPLANT
BLADE SURG 15 STRL SS (BLADE) ×4
BNDG COHESIVE 4X5 TAN STRL (GAUZE/BANDAGES/DRESSINGS) ×2 IMPLANT
BNDG ESMARK 6X9 LF (GAUZE/BANDAGES/DRESSINGS) ×2
CAP PIN PROTECTOR ORTHO WHT (CAP) IMPLANT
CHLORAPREP W/TINT 26ML (MISCELLANEOUS) ×2 IMPLANT
CLOTH BEACON ORANGE TIMEOUT ST (SAFETY) ×2 IMPLANT
COVER TABLE BACK 60X90 (DRAPES) ×2 IMPLANT
CUFF TOURNIQUET SINGLE 34IN LL (TOURNIQUET CUFF) ×2 IMPLANT
DRAPE EXTREMITY T 121X128X90 (DRAPE) ×2 IMPLANT
DRAPE OEC MINIVIEW 54X84 (DRAPES) ×2 IMPLANT
DRAPE U-SHAPE 47X51 STRL (DRAPES) ×2 IMPLANT
DRSG EMULSION OIL 3X3 NADH (GAUZE/BANDAGES/DRESSINGS) ×4 IMPLANT
DRSG PAD ABDOMINAL 8X10 ST (GAUZE/BANDAGES/DRESSINGS) IMPLANT
DURA STEPPER LG (CAST SUPPLIES) ×2 IMPLANT
ELECT REM PT RETURN 9FT ADLT (ELECTROSURGICAL) ×2
ELECTRODE REM PT RTRN 9FT ADLT (ELECTROSURGICAL) ×1 IMPLANT
GAUZE SPONGE 4X4 16PLY XRAY LF (GAUZE/BANDAGES/DRESSINGS) IMPLANT
GLOVE BIO SURGEON STRL SZ8 (GLOVE) ×4 IMPLANT
GLOVE BIOGEL PI IND STRL 8 (GLOVE) ×1 IMPLANT
GLOVE BIOGEL PI INDICATOR 8 (GLOVE) ×1
GLOVE ECLIPSE 6.5 STRL STRAW (GLOVE) ×2 IMPLANT
GOWN PREVENTION PLUS XLARGE (GOWN DISPOSABLE) ×2 IMPLANT
GOWN PREVENTION PLUS XXLARGE (GOWN DISPOSABLE) ×2 IMPLANT
GUIDEWIRE, 0.45 X 5.910 ×4 IMPLANT
IMPLANT SMART TOE 16MM (Toe) ×2 IMPLANT
IMPLANT SMART TOE 19MM (Toe) ×2 IMPLANT
IMPLANT SMART TOE W/POST H 19M (Toe) ×2 IMPLANT
K-WIRE 102X1.4 (WIRE) IMPLANT
KWIRE 4.0 X .045IN (WIRE) IMPLANT
NEEDLE FISTULA 1/2 CIRCLE (NEEDLE) IMPLANT
NEEDLE HYPO 22GX1.5 SAFETY (NEEDLE) IMPLANT
NEEDLE HYPO 25X1 1.5 SAFETY (NEEDLE) IMPLANT
NS IRRIG 1000ML POUR BTL (IV SOLUTION) ×2 IMPLANT
PACK BASIN DAY SURGERY FS (CUSTOM PROCEDURE TRAY) ×2 IMPLANT
PAD CAST 4YDX4 CTTN HI CHSV (CAST SUPPLIES) ×2 IMPLANT
PADDING CAST ABS 4INX4YD NS (CAST SUPPLIES)
PADDING CAST ABS COTTON 4X4 ST (CAST SUPPLIES) IMPLANT
PADDING CAST COTTON 4X4 STRL (CAST SUPPLIES) ×2
PADDING CAST COTTON 6X4 STRL (CAST SUPPLIES) ×2 IMPLANT
PASSER SUT SWANSON 36MM LOOP (INSTRUMENTS) ×2 IMPLANT
PENCIL BUTTON HOLSTER BLD 10FT (ELECTRODE) ×2 IMPLANT
SCREW LP CANN PT 3X36MM (Screw) ×4 IMPLANT
SCREW QUICK FIX 2X12 (Screw) ×4 IMPLANT
SCREW QUICK FIX 2X14 (Screw) ×2 IMPLANT
SHEET MEDIUM DRAPE 40X70 STRL (DRAPES) ×2 IMPLANT
SLEEVE SCD COMPRESS KNEE MED (MISCELLANEOUS) ×2 IMPLANT
SPONGE GAUZE 4X4 12PLY (GAUZE/BANDAGES/DRESSINGS) ×2 IMPLANT
SPONGE LAP 18X18 X RAY DECT (DISPOSABLE) ×2 IMPLANT
STOCKINETTE 6  STRL (DRAPES) ×1
STOCKINETTE 6 STRL (DRAPES) ×1 IMPLANT
SUCTION FRAZIER TIP 10 FR DISP (SUCTIONS) IMPLANT
SUT ETHILON 4 0 PS 2 18 (SUTURE) IMPLANT
SUT MERSILENE 2.0 SH NDLE (SUTURE) IMPLANT
SUT MNCRL AB 3-0 PS2 18 (SUTURE) ×2 IMPLANT
SUT PROLENE 0 CT 2 (SUTURE) ×2 IMPLANT
SUT PROLENE 3 0 PS 2 (SUTURE) ×6 IMPLANT
SUT VIC AB 2-0 SH 27 (SUTURE) ×3
SUT VIC AB 2-0 SH 27XBRD (SUTURE) ×3 IMPLANT
SUT VICRYL 4-0 PS2 18IN ABS (SUTURE) IMPLANT
SYR BULB 3OZ (MISCELLANEOUS) ×2 IMPLANT
SYR CONTROL 10ML LL (SYRINGE) IMPLANT
TOWEL OR 17X24 6PK STRL BLUE (TOWEL DISPOSABLE) ×2 IMPLANT
TUBE CONNECTING 20X1/4 (TUBING) IMPLANT
UNDERPAD 30X30 INCONTINENT (UNDERPADS AND DIAPERS) ×2 IMPLANT
WATER STERILE IRR 1000ML POUR (IV SOLUTION) IMPLANT

## 2011-10-22 NOTE — Anesthesia Preprocedure Evaluation (Signed)
Anesthesia Evaluation  Patient identified by MRN, date of birth, ID band Patient awake    Reviewed: Allergy & Precautions, H&P , NPO status , Patient's Chart, lab work & pertinent test results, reviewed documented beta blocker date and time   History of Anesthesia Complications (+) Emergence Delirium  Airway Mallampati: II TM Distance: >3 FB Neck ROM: full    Dental   Pulmonary neg pulmonary ROS,  breath sounds clear to auscultation        Cardiovascular hypertension, Pt. on medications and Pt. on home beta blockers + CAD and + Past MI + Valvular Problems/Murmurs AS Rhythm:regular     Neuro/Psych negative neurological ROS  negative psych ROS   GI/Hepatic negative GI ROS, Neg liver ROS,   Endo/Other  negative endocrine ROS  Renal/GU negative Renal ROS  negative genitourinary   Musculoskeletal   Abdominal   Peds  Hematology negative hematology ROS (+)   Anesthesia Other Findings See surgeon's H&P   Reproductive/Obstetrics negative OB ROS                           Anesthesia Physical Anesthesia Plan  ASA: III  Anesthesia Plan: General   Post-op Pain Management:    Induction: Intravenous  Airway Management Planned: LMA  Additional Equipment:   Intra-op Plan:   Post-operative Plan: Extubation in OR  Informed Consent: I have reviewed the patients History and Physical, chart, labs and discussed the procedure including the risks, benefits and alternatives for the proposed anesthesia with the patient or authorized representative who has indicated his/her understanding and acceptance.   Dental Advisory Given  Plan Discussed with: CRNA and Surgeon  Anesthesia Plan Comments:         Anesthesia Quick Evaluation

## 2011-10-22 NOTE — Brief Op Note (Signed)
10/22/2011  10:07 AM  PATIENT:  Luis Herrera  71 y.o. male  PRE-OPERATIVE DIAGNOSIS:  left hallux clawtoe left second thru fourth hammer toes and gastrocnemius contracture  POST-OPERATIVE DIAGNOSIS:  left hallux  clawtoe, left second thru fourth hammertoes and left gastrocnemius contracture  Procedure(s): 1.  Left gastrocnemius recession 2.  Left hallux Jones procedure 3.  Left 2-4 metatarsal weil osteotomies 4.  Left 2-5 dorsal capsulotomies with extensor tendon lengthening 5.  Left 2-4 hammertoe corrections (PIP arthrodeses)  SURGEON:  Toni Arthurs, MD  ASSISTANT: n/a  ANESTHESIA:   General, regional  EBL:  minimal   TOURNIQUET:   Total Tourniquet Time Documented: Thigh (Left) - 117 minutes  COMPLICATIONS:  None apparent  DISPOSITION:  Extubated, awake and stable to recovery.  DICTATION ID:  [id number]

## 2011-10-22 NOTE — Transfer of Care (Signed)
Immediate Anesthesia Transfer of Care Note  Patient: Luis Herrera  Procedure(s) Performed: Procedure(s) (LRB) with comments: HAMMERTOE RECONSTRUCTION WITH WEIL OSTEOTOMY AND GASTROC SLIDE (Left) - Left hallux jones procedure, second thru fourth hammer toe corrections second thru fourth weil osteotomy, gastrocnemius recession second thru fourth dorsal capsulotomy and flexor to extensor transfers  Patient Location: PACU  Anesthesia Type: GA combined with regional for post-op pain  Level of Consciousness: awake, alert , oriented and patient cooperative  Airway & Oxygen Therapy: Patient Spontanous Breathing and Patient connected to face mask oxygen  Post-op Assessment: Report given to PACU RN and Post -op Vital signs reviewed and stable  Post vital signs: Reviewed and stable  Complications: No apparent anesthesia complications

## 2011-10-22 NOTE — Anesthesia Procedure Notes (Addendum)
Anesthesia Regional Block:  Popliteal block  Pre-Anesthetic Checklist: ,, timeout performed, Correct Patient, Correct Site, Correct Laterality, Correct Procedure, Correct Position, site marked, Risks and benefits discussed,  Surgical consent,  Pre-op evaluation,  At surgeon's request and post-op pain management  Laterality: Left  Prep: chloraprep       Needles:   Needle Type: Other   (Arrow Echogenic)   Needle Length: 9cm  Needle Gauge: 21    Additional Needles:  Procedures: ultrasound guided Popliteal block Narrative:  Start time: 10/22/2011 7:13 AM End time: 10/22/2011 7:21 AM Injection made incrementally with aspirations every 5 mL.  Performed by: Personally  Anesthesiologist: Aldona Lento, MD  Additional Notes: Ultrasound guidance used to: id relevant anatomy, confirm needle position, local anesthetic spread, avoidance of vascular puncture. Picture saved. No complications. Block performed personally by Janetta Hora. Gelene Mink, MD  .    Popliteal block Procedure Name: LMA Insertion Date/Time: 10/22/2011 7:39 AM Performed by: Graysin Luczynski D Pre-anesthesia Checklist: Patient identified, Emergency Drugs available, Suction available and Timeout performed Patient Re-evaluated:Patient Re-evaluated prior to inductionOxygen Delivery Method: Circle system utilized Preoxygenation: Pre-oxygenation with 100% oxygen Intubation Type: IV induction Ventilation: Mask ventilation without difficulty LMA: LMA inserted LMA Size: 5.0 Number of attempts: 1 Placement Confirmation: positive ETCO2 and breath sounds checked- equal and bilateral Dental Injury: Teeth and Oropharynx as per pre-operative assessment

## 2011-10-22 NOTE — Anesthesia Postprocedure Evaluation (Signed)
Anesthesia Post Note  Patient: Luis Herrera  Procedure(s) Performed: Procedure(s) (LRB): HAMMERTOE RECONSTRUCTION WITH WEIL OSTEOTOMY AND GASTROC SLIDE (Left)  Anesthesia type: General  Patient location: PACU  Post pain: Pain level controlled  Post assessment: Patient's Cardiovascular Status Stable  Last Vitals:  Filed Vitals:   10/22/11 1030  BP: 137/58  Pulse: 67  Temp:   Resp: 18    Post vital signs: Reviewed and stable  Level of consciousness: alert  Complications: No apparent anesthesia complications

## 2011-10-22 NOTE — H&P (Addendum)
Luis Herrera is an 71 y.o. male.   Chief Complaint: left forefoot pain HPI: 71 y/o male with painful left forefoot deformities including claw hallux and 2-4 hammertoes.  Also with gastroc contracture.  He presents now for operative treatmnet.  Past Medical History  Diagnosis Date  . Hyperlipidemia   . Hypertension   . Hx of colonic polyps   . CAD (coronary artery disease)   . Aortic stenosis   . Aortic valve disease   . Blood transfusion   . Arthritis   . Complication of anesthesia     hallucinated once    Past Surgical History  Procedure Date  . Aortic valve replacement   . Total hip replacement 2000    left  . Total knee arthroplasty 2000    left  . Cholecystectomy 1982  . Tonsillectomy   . Cardiac catheterization   . Cardiac valve replacement     aortic  . Correction hammer toe 2011    right  . Colonoscopy   . Gastrocnemius recession 05/28/2011    Procedure: GASTROCNEMIUS SLIDE;  Surgeon: Toni Arthurs, MD;  Location: [facility];  Service: Orthopedics;  Laterality: Right;  right gastroc recession,   . Weil osteotomy 05/28/2011    Procedure: WEIL OSTEOTOMY;  Surgeon: Toni Arthurs, MD;  Location: [facility];  Service: Orthopedics;  Laterality: Right;  right second-third metatarsal weil osteotomy, right second-third toe extensor tendon lengthening, right first metatarsal jones procedure    Family History  Problem Relation Age of Onset  . Hypertension Other   . Hyperlipidemia Other    Social History:  reports that he quit smoking about 38 years ago. He does not have any smokeless tobacco history on file. He reports that he drinks alcohol. He reports that he does not use illicit drugs.  Allergies: No Known Allergies  Medications Prior to Admission  Medication Sig Dispense Refill  . aspirin 325 MG tablet Take 325 mg by mouth daily.        Marland Kitchen atorvastatin (LIPITOR) 80 MG tablet Take 1 tablet (80 mg total) by mouth daily.  90 tablet  3  .  carvedilol (COREG) 6.25 MG tablet HOLD until further notice      . furosemide (LASIX) 40 MG tablet Take 40 mg by mouth daily.        Marland Kitchen olmesartan-hydrochlorothiazide (BENICAR HCT) 40-12.5 MG per tablet HOLD until further notice      . meloxicam (MOBIC) 15 MG tablet Take 15 mg by mouth daily.          Results for orders placed during the hospital encounter of 10/22/11 (from the past 48 hour(s))  BASIC METABOLIC PANEL     Status: Abnormal   Collection Time   10/21/11  1:30 PM      Component Value Range Comment   Sodium 139  135 - 145 mEq/L    Potassium 4.4  3.5 - 5.1 mEq/L    Chloride 100  96 - 112 mEq/L    CO2 31  19 - 32 mEq/L    Glucose, Bld 97  70 - 99 mg/dL    BUN 21  6 - 23 mg/dL    Creatinine, Ser 1.91  0.50 - 1.35 mg/dL    Calcium 9.4  8.4 - 47.8 mg/dL    GFR calc non Af Amer 83 (*) >90 mL/min    GFR calc Af Amer >90  >90 mL/min    No results found.  ROS  No recent f/c/n/v/wt loss  Blood pressure 149/73, pulse 60, temperature 98.1 F (36.7 C), temperature source Oral, resp. rate 16, height 5\' 10"  (1.778 m), weight 93.441 kg (206 lb), SpO2 99.00%. Physical Exam wn wd male in nad.  A and O x 4.  Mood and affect normal.  EOMI.  Respirations unlabored.  L LE with claw hallux and 2-4 hammertoes.  Gastroc tight.  Skin healthy and intact.  No lymphadenopathy.  5/5 strength in pf and df of ankle.  Sens to LT intact througout foot.  Assessment/Plan L claw hallux, 2-4 hammertoes and gastroc contracture - to OR for Jones procedure, 2-4 hammertoe corrections and weil osteotomies, extensor tendon lengthening and dorsal capsulotomies.  The risks and benefits of the alternative treatment options have been discussed in detail.  The patient wishes to proceed with surgery and specifically understands risks of bleeding, infection, nerve damage, blood clots, need for additional surgery, amputation and death.   Toni Arthurs November 10, 2011, 7:16 AM

## 2011-10-22 NOTE — Progress Notes (Signed)
Assisted Dr. Frederick with left, ultrasound guided, popliteal/saphenous block. Side rails up, monitors on throughout procedure. See vital signs in flow sheet. Tolerated Procedure well. 

## 2011-10-23 NOTE — Op Note (Signed)
[name], Luis Herrera               ACCOUNT NO.:  [id number]  MEDICAL RECORD NO.:  [id number]  LOCATION:                                 FACILITY:  PHYSICIAN:  Toni Arthurs, MD        DATE OF BIRTH:  1940/03/30  DATE OF PROCEDURE:  10/22/2011 DATE OF DISCHARGE:                              OPERATIVE REPORT   PREOPERATIVE DIAGNOSES: 1. Left hallux claw toe deformity. 2. 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 3. Left gastrocnemius contracture.  POSTOPERATIVE DIAGNOSES: 1. Left hallux claw toe deformity. 2. 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 3. Left gastrocnemius contracture. 4. 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  PROCEDURE: 1. Left gastrocnemius recession. 2. Left hallux Jones procedure. 3. Left 2nd through 4th metatarsal Weil osteotomies. 4. Left 2 through 5 MTP joint dorsal capsulotomies with extensor     tendon lengthening. 5. Left 2 through 4 hammertoe corrections (PIP arthrodesis).  SURGEON:  Toni Arthurs, MD  ANESTHESIA:  General, regional.  ESTIMATED BLOOD LOSS:  Minimal.  TOURNIQUET TIME:  1 hour and 57 minutes at 225 mmHg.  COMPLICATIONS:  None apparent.  DISPOSITION:  Extubated awake and stable to recovery.  INDICATIONS FOR PROCEDURE:  The patient is a 71 year old male with a painful left forefoot for many years.  He has tight gastrocnemius and overload of the 2nd, 3rd, and 4th metatarsal heads with severe hammertoe deformities of the 2nd through 4th toes, and a claw toe deformity of the hallux.  He presents now for operative treatment of these conditions. He has failed treatment with activity modification, shoe wear modification, and pain medications.  He understands the risks and benefits, the alternative treatment options, and elects surgical treatment.  He specifically understands risks of bleeding, infection, nerve damage, blood clots, need for additional surgery, recurrence of his deformity, amputation, and death.  PROCEDURE IN DETAIL:  After  preoperative consent was obtained, the correct operative site was identified.  The patient was brought to the operating room and placed supine on the operating table.  General anesthesia was induced.  Preoperative antibiotics were administered. Surgical time-out was taken.  Left lower extremity was prepped and draped in standard sterile fashion with tourniquet around the thigh. The extremity was exsanguinated and tourniquet was inflated to 225 mmHg. A longitudinal incision was marked on the medial aspect of the leg at the gastrocnemius tendon.  The incision was made.  Sharp dissection was carried down through the skin.  Blunt dissection was carried down to the superficial fascia.  Fascia was incised.  The gastrocnemius tendon was identified.  The sural nerve was protected as the plantaris tendon and gastrocnemius tendons were divided under direct vision.  The wound was irrigated copiously.  Inverted simple sutures of 3-0 Monocryl were used to close the subcutaneous tissue and a 3-0 running Prolene suture was used to close the skin incision.  Attention was then turned to the hallux where a transverse incision was made over the dorsal aspect of the IP joint.  Sharp dissection was carried down through the skin.  The extensor hallucis longus tendon was released from its insertion on the DIP joint.  A longitudinal incision was made over the hallux  MP joint and sharp dissection was carried down to the EHL tendon.  It was released distally and pulled out through the proximal incision.  Attention was then returned to the IP joint, where an oscillating saw was used to resect the head of the proximal phalanx and the base of the distal phalanx.  The joint was irrigated and then reduced.  Two K-wires were inserted from the tip of the toe across the IP joint into the proximal phalanx.  AP and lateral fluoroscopic images showed appropriate reduction of the IP joint.  Two 3-mm partially threaded  cannulated screws were then inserted over the guidewires and were noted to have appropriate purchase.  AP and lateral views again showed appropriate reduction of the hallux IP joint as well as appropriate position of both screws.  The K-wires were removed.  The wounds were irrigated.  Horizontal mattress sutures of 3-0 Prolene were used to close the distal and dorsal hallux incisions.  Attention was then turned to the first metatarsal.  The dorsal periosteum was cleared with a knife.  A 3.5 mm drill hole was then drilled through the metatarsal neck.  A Swanson suture passer was then used to pass the previously released EHL tendon through the neck of the metatarsal.  It was pulled back over and sewn to itself with 2-0 Vicryl horizontal mattress sutures.  The wound was irrigated copiously. Inverted simple sutures of 3-0 Monocryl were used to close the subcutaneous tissue and a running 3-0 Prolene suture was used to close the skin incision.  Attention was then turned to the 2nd webspace where dorsal longitudinal incision was made.  Sharp dissection was carried down through the skin and blunt dissection was carried down through the subcutaneous tissue to the level of the extensor digitorum longus tendon of the second metatarsal.  The EDB tendon was released and the EDL was lengthened in Z fashion.  The dorsal joint capsule was excised.  The head was exposed and a Weil osteotomy was made with an oscillating saw.  A small wafer of bone was removed from the head of the metatarsal.  The metatarsal head was allowed to translate proximally several mm.  The osteotomy was fixed with a partially-threaded titanium screw.  This was from the Arthrex set.  This process was then repeated for the third MTP joint and metatarsal.  A separate incision was made at the 4th webspace and the Weil osteotomy and dorsal joint capsule release as well as EDL lengthening and EDB release were then carried out as it had  been for the 2nd and 3rd metatarsals.  Attention was then turned to the 5th toe where the extensor tendons were lengthened and the dorsal joint capsule was released allowing passive correction of the toe to a neutral position.  Attention was then turned to the second toe PIP joint.  A transverse incision was made over the joint and carried down through the extensor tendon.  The collateral ligaments were released.  The head was exposed and resected with an oscillating saw to the level of subchondral bone. A drill bit was then inserted up the shaft of the proximal phalanx. This was a drill bit.  This drill bit was advanced into the base of the middle phalanx.  A reamer was then used to resect the joint cartilage at the base of the middle phalanx.  Punches were then inserted into the proximal and middle phalanges for the [employer] Smart Toe implant.  The implant was then taken out of  the freezer and inserted into the proximal phalanx.  The middle phalanx was then reduced over the tip of the implant and the joint was compressed while the device warmed up.  This process was then repeated for the 3rd and 4th toes in the same fashion. Final AP and lateral views showed appropriate reduction of the IP joints and appropriate position and length of all hardware.  The wounds were irrigated copiously.  Horizontal mattress sutures were used to close the PIP joint.  Incisions of the 2nd, 3rd, and 4th toes, the 2 dorsal incisions were closed with inverted simple sutures of 3-0 Monocryl and running 3-0 Prolene sutures.  Sterile dressings were applied followed by a compression wrap.  The tourniquet was released after application of dressings at 1 hour and 57 minutes.  A Cam boot was then applied.  The patient was awakened from anesthesia and transported to the recovery room in stable condition.  FOLLOWUP PLAN:  The patient will be weightbearing as tolerated on his left foot in a Cam walker boot.  He will  follow up with me in 2 weeks for suture removal.     Toni Arthurs, MD     JH/MEDQ  D:  10/22/2011  T:  10/23/2011  Job:  [id number]

## 2011-11-12 DIAGNOSIS — M1710 Unilateral primary osteoarthritis, unspecified knee: Secondary | ICD-10-CM | POA: Diagnosis not present

## 2011-11-17 DIAGNOSIS — M79609 Pain in unspecified limb: Secondary | ICD-10-CM | POA: Diagnosis not present

## 2011-11-20 ENCOUNTER — Encounter (HOSPITAL_COMMUNITY): Payer: Self-pay | Admitting: Pharmacy Technician

## 2011-11-25 ENCOUNTER — Encounter (HOSPITAL_COMMUNITY): Payer: Self-pay

## 2011-11-25 ENCOUNTER — Ambulatory Visit (HOSPITAL_COMMUNITY)
Admission: RE | Admit: 2011-11-25 | Discharge: 2011-11-25 | Disposition: A | Discharge status: Home / Self Care | Payer: Medicare Other | Source: Ambulatory Visit | Attending: Orthopedic Surgery | Admitting: Orthopedic Surgery

## 2011-11-25 ENCOUNTER — Encounter: Payer: Self-pay | Admitting: Internal Medicine

## 2011-11-25 ENCOUNTER — Ambulatory Visit (INDEPENDENT_AMBULATORY_CARE_PROVIDER_SITE_OTHER): Payer: Medicare Other | Admitting: Internal Medicine

## 2011-11-25 ENCOUNTER — Encounter (HOSPITAL_COMMUNITY)
Admission: RE | Admit: 2011-11-25 | Discharge: 2011-11-25 | Disposition: A | Discharge status: Home / Self Care | Payer: Medicare Other | Source: Ambulatory Visit | Attending: Orthopedic Surgery | Admitting: Orthopedic Surgery

## 2011-11-25 VITALS — BP 110/62 | HR 86 | Temp 97.5°F | Resp 16 | Wt 204.0 lb

## 2011-11-25 DIAGNOSIS — M1710 Unilateral primary osteoarthritis, unspecified knee: Secondary | ICD-10-CM | POA: Diagnosis not present

## 2011-11-25 DIAGNOSIS — Z01812 Encounter for preprocedural laboratory examination: Secondary | ICD-10-CM | POA: Diagnosis not present

## 2011-11-25 DIAGNOSIS — I700 Atherosclerosis of aorta: Secondary | ICD-10-CM | POA: Insufficient documentation

## 2011-11-25 DIAGNOSIS — Z954 Presence of other heart-valve replacement: Secondary | ICD-10-CM | POA: Insufficient documentation

## 2011-11-25 DIAGNOSIS — I10 Essential (primary) hypertension: Secondary | ICD-10-CM

## 2011-11-25 DIAGNOSIS — Z01818 Encounter for other preprocedural examination: Secondary | ICD-10-CM | POA: Diagnosis not present

## 2011-11-25 DIAGNOSIS — Z952 Presence of prosthetic heart valve: Secondary | ICD-10-CM

## 2011-11-25 DIAGNOSIS — Z23 Encounter for immunization: Secondary | ICD-10-CM | POA: Diagnosis not present

## 2011-11-25 HISTORY — DX: Personal history of other diseases of the digestive system: Z87.19

## 2011-11-25 LAB — CBC
HCT: 43.2 % (ref 39.0–52.0)
Hemoglobin: 13.9 g/dL (ref 13.0–17.0)
MCV: 90.2 fL (ref 78.0–100.0)
RBC: 4.79 MIL/uL (ref 4.22–5.81)
WBC: 8.2 10*3/uL (ref 4.0–10.5)

## 2011-11-25 LAB — URINALYSIS, ROUTINE W REFLEX MICROSCOPIC
Glucose, UA: NEGATIVE mg/dL
Ketones, ur: NEGATIVE mg/dL
Leukocytes, UA: NEGATIVE
Nitrite: NEGATIVE
Specific Gravity, Urine: 1.01 (ref 1.005–1.030)
pH: 5.5 (ref 5.0–8.0)

## 2011-11-25 LAB — APTT: aPTT: 29 seconds (ref 24–37)

## 2011-11-25 LAB — BASIC METABOLIC PANEL
CO2: 32 mEq/L (ref 19–32)
Chloride: 102 mEq/L (ref 96–112)
Creatinine, Ser: 0.89 mg/dL (ref 0.50–1.35)
GFR calc Af Amer: >90 mL/min (ref >90)
Potassium: 4.9 mEq/L (ref 3.5–5.1)

## 2011-11-25 LAB — SURGICAL PCR SCREEN
MRSA, PCR: NEGATIVE
Staphylococcus aureus: NEGATIVE

## 2011-11-25 LAB — PROTIME-INR: INR: 0.97 (ref 0.00–1.49)

## 2011-11-25 NOTE — Pre-Procedure Instructions (Signed)
Addition to first added note-  CLEARANCE DR Anner Crete, CARDIOLOGY on chart

## 2011-11-25 NOTE — Patient Instructions (Signed)

## 2011-11-25 NOTE — Pre-Procedure Instructions (Signed)
Ekg 4/13 chart, Clearance with old EKG 2012, eccho 8/12. States has to see PCP for clearance this afternoon/  Has small white raised bump right upper thigh that has been there x 3-4 weeks- non reddened, no pain, no drainage- instructed to have PCP look at this today

## 2011-11-25 NOTE — Pre-Procedure Instructions (Signed)
OV  Dr Yetta Barre 11/25/11 EPIC

## 2011-11-25 NOTE — Assessment & Plan Note (Signed)
stable °

## 2011-11-25 NOTE — Patient Instructions (Addendum)
20 [name]  11/25/2011   Your procedure is scheduled on:  12/01/11   Surgery   415PM- 545PM  Report to [facility] at     145PM  Call this number if you have problems the morning of surgery: [phone]        Remember:   Do not eat food after midnight Monday NIGHT          MAY HAVE CLEAR LIQUIDS Tuesday MORNING UNTIL  0745 AM    THEN NOTHING IN MOUTH BUT MEDS AS LISTED BELOW-   Take these medicines the morning of surgery with A SIP OF WATER:   CARVEDILOL   .  Contacts, dentures or partial plates can not be worn to surgery  Leave suitcase in the car. After surgery it may be brought to your room.  For patients admitted to the hospital, checkout time is 11:00 AM day of  discharge.             SPECIAL INSTRUCTIONS- SEE [place] PREPARING FOR SURGERY INSTRUCTION SHEET-     DO NOT WEAR JEWELRY, LOTIONS, POWDERS, OR PERFUMES.  WOMEN-- DO NOT SHAVE LEGS OR UNDERARMS FOR 12 HOURS BEFORE SHOWERS. MEN MAY SHAVE FACE.  Patients discharged the day of surgery will not be allowed to drive home. IF going home the day of surgery, you must have a driver and someone to stay with you for the first 24 hours  Name and phone number of your driver:     [facility]ulance                                                                   Please read over the following fact sheets that you were given: MRSA Information, Incentive Spirometry Sheet, Blood Transfusion Sheet  Information                                                                                   [name]  PST 336  [phone]

## 2011-11-25 NOTE — Assessment & Plan Note (Addendum)
His BP is well controlled, his lytes and renal function look real good. Stop lasix due to low SBP.

## 2011-11-25 NOTE — H&P (Signed)
TOTAL KNEE ADMISSION H&P  Patient is being admitted for right total knee arthroplasty.  Subjective:  Chief Complaint:right knee pain.  HPI: Luis Herrera, 71 y.o. male, has a history of pain and functional disability in the right knee due to arthritis and has failed non-surgical conservative treatments for greater than 12 weeks to includeNSAID's and/or analgesics and activity modification.  Onset of symptoms was gradual, starting >10 years ago with gradually worsening course since that time. The patient noted no past surgery on the right knee(s).  Patient currently rates pain in the right knee(s) at 5 out of 10 with activity. Patient has worsening of pain with activity and weight bearing, pain that interferes with activities of daily living and crepitus.  Patient has evidence of periarticular osteophytes and joint space narrowing by imaging studies. Risks, benefits and expectations were discussed with the patient. Patient understand the risks, benefits and expectations and wishes to proceed with surgery.   D/C Plans:  SNF - Prefers [facility]  Post-op Meds:  No Rx given   Tranexamic Acid:  Not to be given - Previous cardiac surgery  Decadron:   To be given   Patient Active Problem List   Diagnosis Date Noted  . 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 Back pain of thoracolumbar region 09/01/2010  . Routine general medical examination at a health care facility 09/01/2010  . HYPERLIPIDEMIA 04/10/2010  . Essential hypertension, benign 04/10/2010  . Aortic valve replaced 04/10/2010   Past Medical History  Diagnosis Date  . Hyperlipidemia   . Hypertension   . Hx of colonic polyps   . CAD (coronary artery disease)   . Aortic stenosis   . Aortic valve disease   . Blood transfusion   . Arthritis   . Complication of anesthesia     hallucinated once  . H/O hiatal hernia     Past Surgical History  Procedure Date  . Aortic valve replacement   . Total hip replacement 2000   left  . Total knee arthroplasty 2000    left  . Cholecystectomy 1982  . Tonsillectomy   . Cardiac catheterization   . Correction hammer toe 2011    right  . Colonoscopy   . Gastrocnemius recession 05/28/2011  . Weil osteotomy 05/28/2011  . Cardiac valve replacement 2009    aortic    No prescriptions prior to admission   No Known Allergies  History  Substance Use Topics  . Smoking status: Former Smoker    Quit date: 05/25/1973  . Smokeless tobacco: Never Used  . Alcohol Use: Yes     rare    Family History  Problem Relation Age of Onset  . Hypertension Other   . Hyperlipidemia Other      Review of Systems  Constitutional: Negative.   HENT: Negative.   Eyes: Negative.   Respiratory: Negative.   Cardiovascular: Negative.   Gastrointestinal: Negative.   Genitourinary: Negative.   Musculoskeletal: Positive for joint pain.  Skin: Negative.   Neurological: Negative.   Endo/Heme/Allergies: Negative.   Psychiatric/Behavioral: Negative.     Objective:  Physical Exam  Constitutional: He is oriented to person, place, and time. He appears well-developed and well-nourished.  HENT:  Head: Normocephalic and atraumatic.  Mouth/Throat: Oropharynx is clear and moist.  Eyes: Pupils are equal, round, and reactive to light.  Neck: Neck supple. No JVD present. No tracheal deviation present. No thyromegaly present.  Cardiovascular: Normal rate, regular rhythm and intact distal pulses.   Murmur heard. Respiratory: Effort normal  and breath sounds normal. No stridor. No respiratory distress. He has no wheezes.  GI: Soft. There is no tenderness. There is no guarding.  Musculoskeletal:       Right knee: He exhibits decreased range of motion (crepitus throughout ROM), swelling and bony tenderness. He exhibits no effusion, no ecchymosis, no deformity and no laceration. tenderness found.  Lymphadenopathy:    He has no cervical adenopathy.  Neurological: He is alert and oriented to person,  place, and time.  Skin: Skin is warm and dry.  Psychiatric: He has a normal mood and affect.    Vital signs in last 24 hours: Temp:  [97 F (36.1 C)-97.5 F (36.4 C)] 97.5 F (36.4 C) (10/09 1401) Pulse Rate:  [79-86] 86  (10/09 1401) Resp:  [16-18] 16  (10/09 1401) BP: (110-122)/(62-70) 110/62 mmHg (10/09 1401) SpO2:  [92 %-99 %] 92 % (10/09 1401) Weight:  [92.08 kg (203 lb)-92.534 kg (204 lb)] 92.534 kg (204 lb) (10/09 1401)  Labs:   Estimated Body mass index is 29.56 kg/(m^2) as calculated from the following:   Height as of 10/22/11: 5\' 10" (1.778 m).   Weight as of 10/22/11: 206 lb(93.441 kg).   Imaging Review Plain radiographs demonstrate severe degenerative joint disease of the right knee(s). The overall alignment isneutral. The bone quality appears to be good for age and reported activity level.  Assessment/Plan:  End stage arthritis, right knee   The patient history, physical examination, clinical judgment of the provider and imaging studies are consistent with end stage degenerative joint disease of the right knee(s) and total knee arthroplasty is deemed medically necessary. The treatment options including medical management, injection therapy arthroscopy and arthroplasty were discussed at length. The risks and benefits of total knee arthroplasty were presented and reviewed. The risks due to aseptic loosening, infection, stiffness, patella tracking problems, thromboembolic complications and other imponderables were discussed. The patient acknowledged the explanation, agreed to proceed with the plan and consent was signed. Patient is being admitted for inpatient treatment for surgery, pain control, PT, OT, prophylactic antibiotics, VTE prophylaxis, progressive ambulation and ADL's and discharge planning. The patient is planning to be discharged to skilled nursing facility.    Anastasio Auerbach Noah Pelaez   PAC  11/25/2011, 5:43 PM

## 2011-11-25 NOTE — Pre-Procedure Instructions (Signed)
Faxed request for Dr Charlann Boxer and Inda Merlin to review abnormal BMET through [facility]

## 2011-11-25 NOTE — Progress Notes (Signed)
  Subjective:    Patient ID: Luis Herrera, male    DOB: 06/25/1940, 71 y.o.   MRN: [id number]  Hypertension This is a chronic problem. The current episode started more than 1 year ago. The problem has been gradually improving since onset. The problem is controlled. Pertinent negatives include no anxiety, blurred vision, chest pain, headaches, malaise/fatigue, neck pain, orthopnea, palpitations, peripheral edema, PND, shortness of breath or sweats. Past treatments include beta blockers, angiotensin blockers and diuretics. The current treatment provides significant improvement. There are no compliance problems.       Review of Systems  Constitutional: Negative.  Negative for malaise/fatigue.  HENT: Negative.  Negative for neck pain.   Eyes: Negative.  Negative for blurred vision.  Respiratory: Negative.  Negative for shortness of breath.   Cardiovascular: Negative.  Negative for chest pain, palpitations, orthopnea and PND.  Gastrointestinal: Negative.   Genitourinary: Negative.   Musculoskeletal: Negative.   Skin: Negative.   Neurological: Negative.  Negative for headaches.  Hematological: Negative.   Psychiatric/Behavioral: Negative.        Objective:   Physical Exam  Vitals reviewed. Constitutional: He is oriented to person, place, and time. He appears well-developed and well-nourished. No distress.  HENT:  Head: Normocephalic and atraumatic.  Mouth/Throat: Oropharynx is clear and moist. No oropharyngeal exudate.  Eyes: Conjunctivae normal are normal. Right eye exhibits no discharge. Left eye exhibits no discharge. No scleral icterus.  Neck: Normal range of motion. Neck supple. No JVD present. No tracheal deviation present. No thyromegaly present.  Cardiovascular: Normal rate, regular rhythm, S1 normal, S2 normal, intact distal pulses and normal pulses.  Exam reveals no gallop and no friction rub.   Murmur heard.  Systolic murmur is present with a grade of 1/6  Pulmonary/Chest:  Effort normal and breath sounds normal. No stridor. No respiratory distress. He has no wheezes. He has no rales. He exhibits no tenderness.  Abdominal: Soft. Bowel sounds are normal. He exhibits no distension. There is no tenderness. There is no rebound and no guarding.  Musculoskeletal: Normal range of motion. He exhibits no edema and no tenderness.  Lymphadenopathy:    He has no cervical adenopathy.  Neurological: He is oriented to person, place, and time.  Skin: Skin is warm and dry. No rash noted. He is not diaphoretic. No erythema. No pallor.  Psychiatric: He has a normal mood and affect. His behavior is normal. Judgment and thought content normal.      Lab Results  Component Value Date   WBC 8.2 11/25/2011   HGB 13.9 11/25/2011   HCT 43.2 11/25/2011   PLT 226 11/25/2011   GLUCOSE 113* 11/25/2011   CHOL 183 09/01/2010   TRIG 133.0 09/01/2010   HDL 51.10 09/01/2010   LDLCALC 105* 09/01/2010   ALT 27 09/01/2010   AST 29 09/01/2010   NA 141 11/25/2011   K 4.9 11/25/2011   CL 102 11/25/2011   CREATININE 0.89 11/25/2011   BUN 25* 11/25/2011   CO2 32 11/25/2011   TSH 3.49 09/01/2010   PSA 0.98 09/01/2010   INR 0.97 11/25/2011      Assessment & Plan:

## 2011-11-26 NOTE — Pre-Procedure Instructions (Signed)
Clearance Dr Yetta Barre on chart

## 2011-11-30 NOTE — Progress Notes (Signed)
11-30-11 1030 -pt. Made aware of surgery time change to 1400 , and to arrive by 1130 Am to Short Stay. Stop clear liquids by 0800 AM. W. Peggye Poon,RN

## 2011-12-01 ENCOUNTER — Inpatient Hospital Stay (HOSPITAL_COMMUNITY): Payer: Medicare Other | Admitting: Certified Registered Nurse Anesthetist

## 2011-12-01 ENCOUNTER — Encounter (HOSPITAL_COMMUNITY): Payer: Self-pay | Admitting: Certified Registered Nurse Anesthetist

## 2011-12-01 ENCOUNTER — Encounter (HOSPITAL_COMMUNITY): Payer: Self-pay | Admitting: *Deleted

## 2011-12-01 ENCOUNTER — Encounter (HOSPITAL_COMMUNITY)
Admission: RE | Disposition: A | Discharge status: Skilled Nursing Facility | Payer: Self-pay | Source: Ambulatory Visit | Attending: Orthopedic Surgery

## 2011-12-01 ENCOUNTER — Inpatient Hospital Stay (HOSPITAL_COMMUNITY)
Admission: RE | Admit: 2011-12-01 | Discharge: 2011-12-04 | DRG: 470 | Disposition: A | Discharge status: Skilled Nursing Facility | Payer: Medicare Other | Source: Ambulatory Visit | Attending: Orthopedic Surgery | Admitting: Orthopedic Surgery

## 2011-12-01 DIAGNOSIS — M1710 Unilateral primary osteoarthritis, unspecified knee: Principal | ICD-10-CM | POA: Diagnosis present

## 2011-12-01 DIAGNOSIS — E785 Hyperlipidemia, unspecified: Secondary | ICD-10-CM | POA: Diagnosis present

## 2011-12-01 DIAGNOSIS — D62 Acute posthemorrhagic anemia: Secondary | ICD-10-CM | POA: Diagnosis not present

## 2011-12-01 DIAGNOSIS — S8990XA Unspecified injury of unspecified lower leg, initial encounter: Secondary | ICD-10-CM | POA: Diagnosis not present

## 2011-12-01 DIAGNOSIS — E663 Overweight: Secondary | ICD-10-CM | POA: Diagnosis present

## 2011-12-01 DIAGNOSIS — Z96659 Presence of unspecified artificial knee joint: Secondary | ICD-10-CM

## 2011-12-01 DIAGNOSIS — I10 Essential (primary) hypertension: Secondary | ICD-10-CM | POA: Diagnosis not present

## 2011-12-01 DIAGNOSIS — IMO0002 Reserved for concepts with insufficient information to code with codable children: Secondary | ICD-10-CM | POA: Diagnosis not present

## 2011-12-01 DIAGNOSIS — K5900 Constipation, unspecified: Secondary | ICD-10-CM | POA: Diagnosis not present

## 2011-12-01 DIAGNOSIS — I2510 Atherosclerotic heart disease of native coronary artery without angina pectoris: Secondary | ICD-10-CM | POA: Diagnosis not present

## 2011-12-01 DIAGNOSIS — Z5189 Encounter for other specified aftercare: Secondary | ICD-10-CM | POA: Diagnosis not present

## 2011-12-01 DIAGNOSIS — M4714 Other spondylosis with myelopathy, thoracic region: Secondary | ICD-10-CM | POA: Diagnosis not present

## 2011-12-01 DIAGNOSIS — D500 Iron deficiency anemia secondary to blood loss (chronic): Secondary | ICD-10-CM

## 2011-12-01 DIAGNOSIS — E669 Obesity, unspecified: Secondary | ICD-10-CM

## 2011-12-01 DIAGNOSIS — Z6831 Body mass index (BMI) 31.0-31.9, adult: Secondary | ICD-10-CM

## 2011-12-01 DIAGNOSIS — D649 Anemia, unspecified: Secondary | ICD-10-CM | POA: Diagnosis not present

## 2011-12-01 DIAGNOSIS — M25569 Pain in unspecified knee: Secondary | ICD-10-CM | POA: Diagnosis not present

## 2011-12-01 HISTORY — PX: TOTAL KNEE ARTHROPLASTY: SHX125

## 2011-12-01 LAB — TYPE AND SCREEN
ABO/RH(D): A NEG
Antibody Screen: NEGATIVE

## 2011-12-01 SURGERY — ARTHROPLASTY, KNEE, TOTAL
Anesthesia: Monitor Anesthesia Care | Site: Knee | Laterality: Right | Wound class: Clean

## 2011-12-01 MED ORDER — ZOLPIDEM TARTRATE 5 MG PO TABS: 5.0000 mg | ORAL_TABLET | Freq: Every evening | ORAL | Status: DC | PRN

## 2011-12-01 MED ORDER — DEXAMETHASONE SODIUM PHOSPHATE 10 MG/ML IJ SOLN
10.0000 mg | Freq: Once | INTRAMUSCULAR | Status: AC
  Administered 2011-12-02: 10 mg via INTRAVENOUS
  Filled 2011-12-01: qty 1

## 2011-12-01 MED ORDER — LACTATED RINGERS IV SOLN
INTRAVENOUS | Status: DC | PRN
  Administered 2011-12-01 (×3): via INTRAVENOUS

## 2011-12-01 MED ORDER — DIPHENHYDRAMINE HCL 25 MG PO CAPS: 25.0000 mg | ORAL_CAPSULE | Freq: Four times a day (QID) | ORAL | Status: DC | PRN

## 2011-12-01 MED ORDER — HYDROCHLOROTHIAZIDE 10 MG/ML ORAL SUSPENSION
6.2500 mg | Freq: Every day | ORAL | Status: DC
  Administered 2011-12-02 – 2011-12-04 (×3): 6.25 mg via ORAL
  Filled 2011-12-01 (×5): qty 1.25

## 2011-12-01 MED ORDER — SODIUM CHLORIDE 0.9 % IV SOLN
INTRAVENOUS | Status: AC
  Administered 2011-12-01 – 2011-12-02 (×2): via INTRAVENOUS
  Filled 2011-12-01 (×4): qty 1000

## 2011-12-01 MED ORDER — ONDANSETRON HCL 4 MG/2ML IJ SOLN: 4.0000 mg | Freq: Four times a day (QID) | INTRAMUSCULAR | Status: DC | PRN

## 2011-12-01 MED ORDER — ACETAMINOPHEN 10 MG/ML IV SOLN
INTRAVENOUS | Status: DC | PRN
  Administered 2011-12-01: 1000 mg via INTRAVENOUS

## 2011-12-01 MED ORDER — BISACODYL 10 MG RE SUPP: 10.0000 mg | Freq: Every day | RECTAL | Status: DC | PRN

## 2011-12-01 MED ORDER — MEPERIDINE HCL 50 MG/ML IJ SOLN: 6.2500 mg | INTRAMUSCULAR | Status: DC | PRN

## 2011-12-01 MED ORDER — 0.9 % SODIUM CHLORIDE (POUR BTL) OPTIME
TOPICAL | Status: DC | PRN
  Administered 2011-12-01: 1000 mL

## 2011-12-01 MED ORDER — CEFAZOLIN SODIUM-DEXTROSE 2-3 GM-% IV SOLR
2.0000 g | Freq: Four times a day (QID) | INTRAVENOUS | Status: AC
  Administered 2011-12-01 – 2011-12-02 (×2): 2 g via INTRAVENOUS
  Filled 2011-12-01 (×2): qty 50

## 2011-12-01 MED ORDER — EPHEDRINE SULFATE 50 MG/ML IJ SOLN
INTRAMUSCULAR | Status: DC | PRN
  Administered 2011-12-01 (×4): 5 mg via INTRAVENOUS

## 2011-12-01 MED ORDER — FENTANYL CITRATE 0.05 MG/ML IJ SOLN
INTRAMUSCULAR | Status: DC | PRN
  Administered 2011-12-01 (×5): 50 ug via INTRAVENOUS

## 2011-12-01 MED ORDER — ATORVASTATIN CALCIUM 80 MG PO TABS
80.0000 mg | ORAL_TABLET | Freq: Every day | ORAL | Status: DC
  Administered 2011-12-01 – 2011-12-03 (×3): 80 mg via ORAL
  Filled 2011-12-01 (×4): qty 1

## 2011-12-01 MED ORDER — OLMESARTAN MEDOXOMIL-HCTZ 40-12.5 MG PO TABS: 0.5000 | ORAL_TABLET | Freq: Every day | ORAL | Status: DC

## 2011-12-01 MED ORDER — RIVAROXABAN 10 MG PO TABS
10.0000 mg | ORAL_TABLET | ORAL | Status: DC
  Administered 2011-12-02 – 2011-12-04 (×3): 10 mg via ORAL
  Filled 2011-12-01 (×4): qty 1

## 2011-12-01 MED ORDER — FLEET ENEMA 7-19 GM/118ML RE ENEM: 1.0000 | ENEMA | Freq: Once | RECTAL | Status: AC | PRN

## 2011-12-01 MED ORDER — CELECOXIB 200 MG PO CAPS
200.0000 mg | ORAL_CAPSULE | Freq: Two times a day (BID) | ORAL | Status: DC
Start: 2011-12-01 — End: 2011-12-04
  Administered 2011-12-01 – 2011-12-04 (×6): 200 mg via ORAL
  Filled 2011-12-01 (×7): qty 1

## 2011-12-01 MED ORDER — FERROUS SULFATE 325 (65 FE) MG PO TABS
325.0000 mg | ORAL_TABLET | Freq: Three times a day (TID) | ORAL | Status: DC
  Administered 2011-12-01 – 2011-12-04 (×8): 325 mg via ORAL
  Filled 2011-12-01 (×11): qty 1

## 2011-12-01 MED ORDER — OXYCODONE HCL 5 MG PO TABS: 5.0000 mg | ORAL_TABLET | Freq: Once | ORAL | Status: DC | PRN

## 2011-12-01 MED ORDER — DOCUSATE SODIUM 100 MG PO CAPS
100.0000 mg | ORAL_CAPSULE | Freq: Two times a day (BID) | ORAL | Status: DC
  Administered 2011-12-01 – 2011-12-04 (×6): 100 mg via ORAL

## 2011-12-01 MED ORDER — MIDAZOLAM HCL 5 MG/5ML IJ SOLN
INTRAMUSCULAR | Status: DC | PRN
  Administered 2011-12-01 (×2): 1 mg via INTRAVENOUS

## 2011-12-01 MED ORDER — DEXAMETHASONE SODIUM PHOSPHATE 10 MG/ML IJ SOLN
10.0000 mg | Freq: Once | INTRAMUSCULAR | Status: DC
  Filled 2011-12-01: qty 1

## 2011-12-01 MED ORDER — HYDROMORPHONE HCL PF 1 MG/ML IJ SOLN: 0.5000 mg | INTRAMUSCULAR | Status: DC | PRN

## 2011-12-01 MED ORDER — KETOROLAC TROMETHAMINE 30 MG/ML IJ SOLN
INTRAMUSCULAR | Status: DC | PRN
  Administered 2011-12-01: 30 mg via INTRAVENOUS

## 2011-12-01 MED ORDER — MENTHOL 3 MG MT LOZG: 1.0000 | LOZENGE | OROMUCOSAL | Status: DC | PRN

## 2011-12-01 MED ORDER — IRBESARTAN 150 MG PO TABS
150.0000 mg | ORAL_TABLET | Freq: Every day | ORAL | Status: DC
  Administered 2011-12-02 – 2011-12-04 (×3): 150 mg via ORAL
  Filled 2011-12-01 (×3): qty 1

## 2011-12-01 MED ORDER — PROMETHAZINE HCL 25 MG/ML IJ SOLN: 6.2500 mg | INTRAMUSCULAR | Status: DC | PRN

## 2011-12-01 MED ORDER — HYDROCODONE-ACETAMINOPHEN 7.5-325 MG PO TABS
1.0000 | ORAL_TABLET | ORAL | Status: DC
  Administered 2011-12-01: 1 via ORAL
  Administered 2011-12-01: 2 via ORAL
  Administered 2011-12-01: 1 via ORAL
  Administered 2011-12-02 (×3): 2 via ORAL
  Administered 2011-12-02 (×2): 1 via ORAL
  Administered 2011-12-02 – 2011-12-03 (×3): 2 via ORAL
  Administered 2011-12-03 (×4): 1 via ORAL
  Administered 2011-12-04 (×2): 2 via ORAL
  Filled 2011-12-01: qty 2
  Filled 2011-12-01: qty 1
  Filled 2011-12-01 (×4): qty 2
  Filled 2011-12-01: qty 1
  Filled 2011-12-01 (×3): qty 2
  Filled 2011-12-01: qty 1
  Filled 2011-12-01: qty 2
  Filled 2011-12-01 (×3): qty 1
  Filled 2011-12-01: qty 2
  Filled 2011-12-01 (×2): qty 1

## 2011-12-01 MED ORDER — ONDANSETRON HCL 4 MG PO TABS: 4.0000 mg | ORAL_TABLET | Freq: Four times a day (QID) | ORAL | Status: DC | PRN

## 2011-12-01 MED ORDER — CARVEDILOL 6.25 MG PO TABS
6.2500 mg | ORAL_TABLET | Freq: Two times a day (BID) | ORAL | Status: DC
  Administered 2011-12-01 – 2011-12-04 (×6): 6.25 mg via ORAL
  Filled 2011-12-01 (×7): qty 1

## 2011-12-01 MED ORDER — ACETAMINOPHEN 10 MG/ML IV SOLN: 1000.0000 mg | Freq: Once | INTRAVENOUS | Status: DC | PRN

## 2011-12-01 MED ORDER — POLYETHYLENE GLYCOL 3350 17 G PO PACK
17.0000 g | PACK | Freq: Two times a day (BID) | ORAL | Status: DC
  Administered 2011-12-01 – 2011-12-04 (×5): 17 g via ORAL

## 2011-12-01 MED ORDER — METHOCARBAMOL 100 MG/ML IJ SOLN
500.0000 mg | Freq: Four times a day (QID) | INTRAVENOUS | Status: DC | PRN
  Administered 2011-12-01: 500 mg via INTRAVENOUS
  Filled 2011-12-01 (×2): qty 5

## 2011-12-01 MED ORDER — HYDROMORPHONE HCL PF 1 MG/ML IJ SOLN: 0.2500 mg | INTRAMUSCULAR | Status: DC | PRN

## 2011-12-01 MED ORDER — PROPOFOL INFUSION 10 MG/ML OPTIME
INTRAVENOUS | Status: DC | PRN
  Administered 2011-12-01: 50 ug/kg/min via INTRAVENOUS

## 2011-12-01 MED ORDER — OXYCODONE HCL 5 MG/5ML PO SOLN
5.0000 mg | Freq: Once | ORAL | Status: DC | PRN
  Filled 2011-12-01: qty 5

## 2011-12-01 MED ORDER — ALUM & MAG HYDROXIDE-SIMETH 200-200-20 MG/5ML PO SUSP: 30.0000 mL | ORAL | Status: DC | PRN

## 2011-12-01 MED ORDER — CEFAZOLIN SODIUM-DEXTROSE 2-3 GM-% IV SOLR
2.0000 g | INTRAVENOUS | Status: AC
  Administered 2011-12-01: 2 g via INTRAVENOUS

## 2011-12-01 MED ORDER — METHOCARBAMOL 500 MG PO TABS
500.0000 mg | ORAL_TABLET | Freq: Four times a day (QID) | ORAL | Status: DC | PRN
  Administered 2011-12-02 – 2011-12-03 (×3): 500 mg via ORAL
  Filled 2011-12-01 (×3): qty 1

## 2011-12-01 MED ORDER — PHENOL 1.4 % MT LIQD: 1.0000 | OROMUCOSAL | Status: DC | PRN

## 2011-12-01 MED ORDER — BUPIVACAINE-EPINEPHRINE PF 0.25-1:200000 % IJ SOLN
INTRAMUSCULAR | Status: DC | PRN
  Administered 2011-12-01: 50 mL

## 2011-12-01 MED ORDER — BUPIVACAINE IN DEXTROSE 0.75-8.25 % IT SOLN
INTRATHECAL | Status: DC | PRN
  Administered 2011-12-01: 1.8 mL via INTRATHECAL

## 2011-12-01 SURGICAL SUPPLY — 55 items
BAG ZIPLOCK 12X15 (MISCELLANEOUS) ×2 IMPLANT
BANDAGE ELASTIC 6 VELCRO ST LF (GAUZE/BANDAGES/DRESSINGS) ×2 IMPLANT
BANDAGE ESMARK 6X9 LF (GAUZE/BANDAGES/DRESSINGS) ×1 IMPLANT
BLADE SAW SGTL 13.0X1.19X90.0M (BLADE) ×2 IMPLANT
BNDG ESMARK 6X9 LF (GAUZE/BANDAGES/DRESSINGS) ×2
BOWL SMART MIX CTS (DISPOSABLE) ×2 IMPLANT
CEMENT HV SMART SET (Cement) ×4 IMPLANT
CLOTH BEACON ORANGE TIMEOUT ST (SAFETY) ×2 IMPLANT
CUFF TOURN SGL QUICK 34 (TOURNIQUET CUFF) ×1
CUFF TRNQT CYL 34X4X40X1 (TOURNIQUET CUFF) ×1 IMPLANT
DECANTER SPIKE VIAL GLASS SM (MISCELLANEOUS) ×2 IMPLANT
DERMABOND ADVANCED (GAUZE/BANDAGES/DRESSINGS) ×1
DERMABOND ADVANCED .7 DNX12 (GAUZE/BANDAGES/DRESSINGS) ×1 IMPLANT
DRAPE EXTREMITY T 121X128X90 (DRAPE) ×2 IMPLANT
DRAPE POUCH INSTRU U-SHP 10X18 (DRAPES) ×2 IMPLANT
DRAPE U-SHAPE 47X51 STRL (DRAPES) ×2 IMPLANT
DRSG AQUACEL AG ADV 3.5X10 (GAUZE/BANDAGES/DRESSINGS) ×2 IMPLANT
DRSG TEGADERM 4X4.75 (GAUZE/BANDAGES/DRESSINGS) ×2 IMPLANT
DURAPREP 26ML APPLICATOR (WOUND CARE) ×2 IMPLANT
ELECT REM PT RETURN 9FT ADLT (ELECTROSURGICAL) ×2
ELECTRODE REM PT RTRN 9FT ADLT (ELECTROSURGICAL) ×1 IMPLANT
EVACUATOR 1/8 PVC DRAIN (DRAIN) ×2 IMPLANT
FACESHIELD LNG OPTICON STERILE (SAFETY) ×10 IMPLANT
GAUZE SPONGE 2X2 8PLY STRL LF (GAUZE/BANDAGES/DRESSINGS) ×1 IMPLANT
GLOVE BIOGEL PI IND STRL 7.5 (GLOVE) ×1 IMPLANT
GLOVE BIOGEL PI IND STRL 8 (GLOVE) ×2 IMPLANT
GLOVE BIOGEL PI INDICATOR 7.5 (GLOVE) ×1
GLOVE BIOGEL PI INDICATOR 8 (GLOVE) ×2
GLOVE ECLIPSE 8.0 STRL XLNG CF (GLOVE) ×4 IMPLANT
GLOVE ORTHO TXT STRL SZ7.5 (GLOVE) ×4 IMPLANT
GOWN BRE IMP PREV XXLGXLNG (GOWN DISPOSABLE) ×4 IMPLANT
GOWN STRL NON-REIN LRG LVL3 (GOWN DISPOSABLE) ×2 IMPLANT
HANDPIECE INTERPULSE COAX TIP (DISPOSABLE) ×1
IMMOBILIZER KNEE 20 (SOFTGOODS) ×2
IMMOBILIZER KNEE 20 THIGH 36 (SOFTGOODS) ×1 IMPLANT
KIT BASIN OR (CUSTOM PROCEDURE TRAY) ×2 IMPLANT
MANIFOLD NEPTUNE II (INSTRUMENTS) ×2 IMPLANT
NDL SAFETY ECLIPSE 18X1.5 (NEEDLE) ×1 IMPLANT
NEEDLE HYPO 18GX1.5 SHARP (NEEDLE) ×1
NS IRRIG 1000ML POUR BTL (IV SOLUTION) ×2 IMPLANT
PACK TOTAL JOINT (CUSTOM PROCEDURE TRAY) ×2 IMPLANT
POSITIONER SURGICAL ARM (MISCELLANEOUS) ×2 IMPLANT
SET HNDPC FAN SPRY TIP SCT (DISPOSABLE) ×1 IMPLANT
SET PAD KNEE POSITIONER (MISCELLANEOUS) ×2 IMPLANT
SPONGE GAUZE 2X2 STER 10/PKG (GAUZE/BANDAGES/DRESSINGS) ×1
SUCTION FRAZIER 12FR DISP (SUCTIONS) ×2 IMPLANT
SUT MNCRL AB 4-0 PS2 18 (SUTURE) ×2 IMPLANT
SUT VIC AB 1 CT1 36 (SUTURE) ×2 IMPLANT
SUT VIC AB 2-0 CT1 27 (SUTURE) ×3
SUT VIC AB 2-0 CT1 TAPERPNT 27 (SUTURE) ×3 IMPLANT
SYR 50ML LL SCALE MARK (SYRINGE) ×2 IMPLANT
TOWEL OR 17X26 10 PK STRL BLUE (TOWEL DISPOSABLE) ×4 IMPLANT
TRAY FOLEY CATH 14FRSI W/METER (CATHETERS) ×2 IMPLANT
WATER STERILE IRR 1500ML POUR (IV SOLUTION) ×4 IMPLANT
WRAP KNEE MAXI GEL POST OP (GAUZE/BANDAGES/DRESSINGS) ×2 IMPLANT

## 2011-12-01 NOTE — Anesthesia Preprocedure Evaluation (Addendum)
Anesthesia Evaluation  Patient identified by MRN, date of birth, ID band Patient awake    Reviewed: Allergy & Precautions, H&P , NPO status , Patient's Chart, lab work & pertinent test results  Airway Mallampati: II TM Distance: >3 FB Neck ROM: Full    Dental  (+) Dental Advisory Given, Teeth Intact, Missing and Partial Upper   Pulmonary former smoker,  breath sounds clear to auscultation  Pulmonary exam normal       Cardiovascular hypertension, Pt. on medications and Pt. on home beta blockers + CAD Rhythm:Regular Rate:Normal     Neuro/Psych negative neurological ROS  negative psych ROS   GI/Hepatic Neg liver ROS, hiatal hernia,   Endo/Other  negative endocrine ROS  Renal/GU negative Renal ROS     Musculoskeletal  (+) Arthritis -, Osteoarthritis,    Abdominal   Peds  Hematology negative hematology ROS (+)   Anesthesia Other Findings   Reproductive/Obstetrics                        Anesthesia Physical Anesthesia Plan  ASA: III  Anesthesia Plan: Spinal   Post-op Pain Management:    Induction:   Airway Management Planned: Simple Face Mask  Additional Equipment:   Intra-op Plan:   Post-operative Plan: Extubation in OR  Informed Consent: I have reviewed the patients History and Physical, chart, labs and discussed the procedure including the risks, benefits and alternatives for the proposed anesthesia with the patient or authorized representative who has indicated his/her understanding and acceptance.   Dental advisory given  Plan Discussed with: CRNA  Anesthesia Plan Comments:       Anesthesia Quick Evaluation

## 2011-12-01 NOTE — Interval H&P Note (Signed)
History and Physical Interval Note:  12/01/2011 11:46 AM  Luis Herrera [place] Scrape  has presented today for surgery, with the diagnosis of right knee osteoarthritis  The various methods of treatment have been discussed with the patient and family. After consideration of risks, benefits and other options for treatment, the patient has consented to  Procedure(s) (LRB) with comments: TOTAL KNEE ARTHROPLASTY (Right) as a surgical intervention .  The patient's history has been reviewed, patient examined, no change in status, stable for surgery.  I have reviewed the patient's chart and labs.  Questions were answered to the patient's satisfaction.     Shelda Pal

## 2011-12-01 NOTE — Anesthesia Postprocedure Evaluation (Signed)
Anesthesia Post Note  Patient: Perfecto Bur  Procedure(s) Performed: Procedure(s) (LRB): TOTAL KNEE ARTHROPLASTY (Right)  Anesthesia type: Spinal  Patient location: PACU  Post pain: Pain level controlled  Post assessment: Post-op Vital signs reviewed  Last Vitals: BP 153/53  Pulse 58  Temp 36.3 C (Oral)  Resp 16  SpO2 100%  Post vital signs: Reviewed  Level of consciousness: sedated  Complications: No apparent anesthesia complications

## 2011-12-01 NOTE — Anesthesia Procedure Notes (Signed)
Spinal  Patient location during procedure: OR Staffing Anesthesiologist: Dessirae Scarola Performed by: anesthesiologist  Preanesthetic Checklist Completed: patient identified, site marked, surgical consent, pre-op evaluation, timeout performed, IV checked, risks and benefits discussed and monitors and equipment checked Spinal Block Patient position: sitting Prep: Betadine Patient monitoring: heart rate, continuous pulse ox and blood pressure Approach: right paramedian Location: L2-3 Injection technique: single-shot Needle Needle type: Spinocan  Needle gauge: 22 G Needle length: 9 cm Additional Notes Expiration date of kit checked and confirmed. Patient tolerated procedure well, without complications.     

## 2011-12-01 NOTE — Op Note (Signed)
NAME:  Luis Herrera                      MEDICAL RECORD NO.:  [id number]                             FACILITY:  [facility]      PHYSICIAN:  Madlyn Frankel. Charlann Boxer, M.D.  DATE OF BIRTH:  10/02/1940      DATE OF PROCEDURE:  12/01/2011                                     OPERATIVE REPORT         PREOPERATIVE DIAGNOSIS:  Right knee osteoarthritis.      POSTOPERATIVE DIAGNOSIS:  Right knee osteoarthritis.      FINDINGS:  The patient was noted to have complete loss of cartilage and   bone-on-bone arthritis with associated osteophytes in all three compartments of the knee worse in the patellofemoral joint with a significant synovitis and associated effusion.      PROCEDURE:  Right total knee replacement.      COMPONENTS USED:  DePuy rotating platform posterior stabilized knee   system, a size 4 femur, 4 tibia, 12.5 mm insert, and 38 patellar   button.      SURGEON:  Madlyn Frankel. Charlann Boxer, M.D.      ASSISTANT:  Lanney Gins, PA-C.      ANESTHESIA:  Spinal.      SPECIMENS:  None.      COMPLICATION:  None.      DRAINS:  One Hemovac.  EBL: <150cc      TOURNIQUET TIME:   Total Tourniquet Time Documented: Thigh (Right) - 43 minutes .      The patient was stable to the recovery room.      INDICATION FOR PROCEDURE:  Luis Herrera is a 71 y.o. male patient of   mine.  The patient had been seen, evaluated, and treated conservatively in the   office with medication, activity modification, and injections.  The patient had   radiographic changes of bone-on-bone arthritis with endplate sclerosis and osteophytes noted.      The patient failed conservative measures including medication, injections, and activity modification, and at this point was ready for more definitive measures.   Based on the radiographic changes and failed conservative measures, the patient   decided to proceed with total knee replacement.  Risks of infection,   DVT, component failure, need for revision surgery, postop course, and     expectations were all   discussed and reviewed.  Consent was obtained for benefit of pain   relief.      PROCEDURE IN DETAIL:  The patient was brought to the operative theater.   Once adequate anesthesia, preoperative antibiotics, 2 gm of Ancef administered, the patient was positioned supine with the right thigh tourniquet placed.  The  right lower extremity was prepped and draped in sterile fashion.  A time-   out was performed identifying the patient, planned procedure, and   extremity.      The right lower extremity was placed in the [facility] leg holder.  The leg was   exsanguinated, tourniquet elevated to 250 mmHg.  A midline incision was   made followed by median parapatellar arthrotomy.  Following initial   exposure, attention was first directed to the patella.  Precut  measurement was noted to be 23 mm.  I resected down to 14 mm and used a   38 patellar button to restore patellar height as well as cover the cut   surface.      The lug holes were drilled and a metal shim was placed to protect the   patella from retractors and saw blades.      At this point, attention was now directed to the femur.  The femoral   canal was opened with a drill, irrigated to try to prevent fat emboli.  An   intramedullary rod was passed at 3 degrees valgus, 10 mm of bone was   resected off the distal femur.  Following this resection, the tibia was   subluxated anteriorly.  Using the extramedullary guide, 8 mm of bone was resected off   the proximal lateral tibia.  We confirmed the gap would be   stable medially and laterally with a 10 mm insert as well as confirmed   the cut was perpendicular in the coronal plane, checking with an alignment rod.      Once this was done, I sized the femur to be a size 4 in the anterior-   posterior dimension, chose a standard component based on medial and   lateral dimension.  The size 4 rotation block was then pinned in   position anterior referenced using the  C-clamp to set rotation.  The   anterior, posterior, and  chamfer cuts were made without difficulty nor   notching making certain that I was along the anterior cortex to help   with flexion gap stability.      The final box cut was made off the lateral aspect of distal femur.      At this point, the tibia was sized to be a size 4, the size 4 tray was   then pinned in position through the medial third of the tubercle,   drilled, and keel punched.  Trial reduction was now carried with a 4 femur,  4 tibia, a 12.5 mm insert, and the 38 patella botton.  The knee was brought to   extension, full extension with good flexion stability with the patella   tracking through the trochlea without application of pressure.  Given   all these findings, the trial components removed.  Final components were   opened and cement was mixed.  The knee was irrigated with normal saline   solution and pulse lavage.  The synovial lining was   then injected with 0.25% Marcaine with epinephrine and 1 cc of Toradol,   total of 61 cc.      The knee was irrigated.  Final implants were then cemented onto clean and   dried cut surfaces of bone with the knee brought to extension with a 12.5 mm trial insert.      Once the cement had fully cured, the excess cement was removed   throughout the knee.  I confirmed I was satisfied with the range of   motion and stability, and the final 12.5 mm PS insert was chosen.  It was   placed into the knee.      The tourniquet had been let down at 43 minutes.  No significant   hemostasis required.  The medium Hemovac drain was placed deep.  The   extensor mechanism was then reapproximated using #1 Vicryl with the knee   in flexion.  The   remaining wound was closed with 2-0 Vicryl and  running 4-0 Monocryl.   The knee was cleaned, dried, dressed sterilely using Dermabond and   Aquacel dressing.  Drain site dressed separately.  The patient was then   brought to recovery room in stable  condition, tolerating the procedure   well.   Please note that Physician Assistant, Lanney Gins, was present for the entirety of the case, and was utilized for pre-operative positioning, peri-operative retractor management, general facilitation of the procedure.  He was also utilized for primary wound closure at the end of the case.              Madlyn Frankel Charlann Boxer, M.D.

## 2011-12-01 NOTE — Plan of Care (Signed)
Problem: Consults Goal: Diagnosis- Total Joint Replacement Primary Total Knee     

## 2011-12-01 NOTE — Transfer of Care (Signed)
Immediate Anesthesia Transfer of Care Note  Patient: Luis Herrera  Procedure(s) Performed: Procedure(s) (LRB) with comments: TOTAL KNEE ARTHROPLASTY (Right)  Patient Location: PACU  Anesthesia Type: Regional  Level of Consciousness: awake, alert  and oriented  Airway & Oxygen Therapy: Patient Spontanous Breathing and Patient connected to face mask oxygen  Post-op Assessment: Report given to PACU RN  Post vital signs: Reviewed and stable  Complications: No apparent anesthesia complications

## 2011-12-02 ENCOUNTER — Encounter (HOSPITAL_COMMUNITY): Payer: Self-pay | Admitting: Orthopedic Surgery

## 2011-12-02 DIAGNOSIS — D500 Iron deficiency anemia secondary to blood loss (chronic): Secondary | ICD-10-CM

## 2011-12-02 DIAGNOSIS — E669 Obesity, unspecified: Secondary | ICD-10-CM

## 2011-12-02 LAB — CBC
Hemoglobin: 9.7 g/dL — ABNORMAL LOW (ref 13.0–17.0)
MCH: 29.4 pg (ref 26.0–34.0)
MCV: 90.3 fL (ref 78.0–100.0)
RBC: 3.3 MIL/uL — ABNORMAL LOW (ref 4.22–5.81)

## 2011-12-02 LAB — BASIC METABOLIC PANEL
CO2: 27 mEq/L (ref 19–32)
Calcium: 8.1 mg/dL — ABNORMAL LOW (ref 8.4–10.5)
Creatinine, Ser: 0.82 mg/dL (ref 0.50–1.35)
Glucose, Bld: 108 mg/dL — ABNORMAL HIGH (ref 70–99)

## 2011-12-02 MED ORDER — FUROSEMIDE 40 MG PO TABS
40.0000 mg | ORAL_TABLET | Freq: Every day | ORAL | Status: DC
  Administered 2011-12-02 – 2011-12-04 (×3): 40 mg via ORAL
  Filled 2011-12-02 (×3): qty 1

## 2011-12-02 NOTE — Progress Notes (Signed)
   Subjective: 1 Day Post-Op Procedure(s) (LRB): TOTAL KNEE ARTHROPLASTY (Right)   Patient reports pain as mild, pain well controlled. No events throughout the night.   Objective:   VITALS:   Filed Vitals:   12/02/11 0550  BP: 108/65  Pulse: 63  Temp: 97.6 F (36.4 C)  Resp: 14    Neurovascular intact Dorsiflexion/Plantar flexion intact Incision: dressing C/D/I No cellulitis present Compartment soft  LABS  Basename 12/02/11 0345  HGB 9.7*  HCT 29.8*  WBC 10.3  PLT 156     Basename 12/02/11 0345  NA 137  K 4.2  BUN 18  CREATININE 0.82  GLUCOSE 108*     Assessment/Plan: 1 Day Post-Op Procedure(s) (LRB): TOTAL KNEE ARTHROPLASTY (Right) HV drain d/c'ed Foley cath d/c'ed Advance diet Up with therapy D/C IV fluids Discharge to SNF eventually when ready.  Expected ABLA  Treated with iron and will observe  Overweight (BMI 25-29.9)  Estimated Body mass index is 28.31 kg/(m^2) as calculated from the following:   Height as of this encounter: 5\' 11" (1.803 m).   Weight as of this encounter: 203 lb(92.08 kg). Patient also counseled that weight may inhibit the healing process Patient counseled that losing weight will help with future health issues      Anastasio Auerbach. Alik Mawson   PAC  12/02/2011, 7:48 AM

## 2011-12-02 NOTE — Progress Notes (Signed)
SNF bed at [facility] confirmed for patient for tomorrow- anticipate d/c and will f/u tomorrow for further planning. Reece Levy, MSW, Theresia Majors [phone]

## 2011-12-02 NOTE — Progress Notes (Signed)
Physical Therapy Treatment Note   12/02/11 1600  PT Visit Information  Last PT Received On 12/02/11  Assistance Needed +1  PT Time Calculation  PT Start Time 1341  PT Stop Time 1407  PT Time Calculation (min) 26 min  Subjective Data  Subjective I usually give 110%.  Precautions  Precautions Knee  Precaution Comments able to perform SLF  Required Braces or Orthoses Knee Immobilizer - Right  Knee Immobilizer - Right Discontinue once straight leg raise with < 10 degree lag  Restrictions  RLE Weight Bearing WBAT  Cognition  Overall Cognitive Status Appears within functional limits for tasks assessed/performed  Bed Mobility  Bed Mobility Sit to Supine  Sit to Supine 5: Supervision  Details for Bed Mobility Assistance pt able to lift R LE onto bed  Transfers  Transfers Sit to Stand;Stand to Sit  Sit to Stand 4: Min guard;With upper extremity assist;From chair/3-in-1  Stand to Sit 4: Min guard;With upper extremity assist;To bed  Details for Transfer Assistance verbal cue for R LE forward  Ambulation/Gait  Ambulation/Gait Assistance 4: Min guard  Ambulation Distance (Feet) 120 Feet  Assistive device Rolling walker  Ambulation/Gait Assistance Details verbal cue for posture and step length  Gait Pattern Step-to pattern;Decreased stance time - right;Antalgic  Exercises  Exercises Total Joint  Total Joint Exercises  Ankle Circles/Pumps AROM;20 reps;Both  Quad Sets AROM;Both;20 reps  Gluteal Sets AROM;20 reps;Both  Towel Squeeze AROM;Both;20 reps  Short Arc Quad AROM;Strengthening;Right;20 reps  Heel Slides AAROM;Right;15 reps  Hip ABduction/ADduction AROM;Strengthening;Right;10 reps  Straight Leg Raises AROM;Right;10 reps  Goniometric ROM R knee AAROM -2-45* limited by pain  PT - End of Session  Equipment Utilized During Treatment Right knee immobilizer  Activity Tolerance Patient tolerated treatment well  Patient left in bed;with call bell/phone within reach;with  family/visitor present  PT - Assessment/Plan  Comments on Treatment Session Pt ambulated again this afternoon and performed exercises.  PT Plan Discharge plan remains appropriate;Frequency remains appropriate  Follow Up Recommendations Post acute inpatient  Does the patient have the potential to tolerate intense rehabilitation? No, Recommend SNF  Equipment Recommended None recommended by PT  Acute Rehab PT Goals  PT Goal: Sit to Supine/Side - Progress Progressing toward goal  PT Goal: Sit to Stand - Progress Progressing toward goal  PT Goal: Stand to Sit - Progress Progressing toward goal  PT Goal: Ambulate - Progress Progressing toward goal  PT Goal: Perform Home Exercise Program - Progress Progressing toward goal  PT General Charges  $$ ACUTE PT VISIT 1 Procedure  PT Treatments  $Gait Training 8-22 mins  $Therapeutic Exercise 8-22 mins    Pain: 3/10 R knee, rest and ice applied  Zenovia Jarred, PT Pager: [phone]

## 2011-12-02 NOTE — Evaluation (Signed)
Physical Therapy Evaluation Patient Details Name: Luis Herrera MRN: [id number] DOB: 05-02-1940 Today's Date: 12/02/2011 Time: 8295-6213 PT Time Calculation (min): 16 min  PT Assessment / Plan / Recommendation Clinical Impression  Pt s/p R TKR.  Pt would benefit from acute PT services in order to improve independence with transfers and ambulation by increasing R LE strength and ROM to prepare for d/c to SNF.    PT Assessment  Patient needs continued PT services    Follow Up Recommendations  Post acute inpatient    Does the patient have the potential to tolerate intense rehabilitation   No, Recommend SNF  Barriers to Discharge        Equipment Recommendations  None recommended by PT    Recommendations for Other Services     Frequency 7X/week    Precautions / Restrictions Precautions Precautions: Knee Required Braces or Orthoses: Knee Immobilizer - Right Knee Immobilizer - Right: Discontinue once straight leg raise with < 10 degree lag Restrictions RLE Weight Bearing: Weight bearing as tolerated   Pertinent Vitals/Pain 3/10 R knee, ice applied, repositioned      Mobility  Bed Mobility Bed Mobility: Supine to Sit Supine to Sit: 5: Supervision;HOB elevated Transfers Transfers: Sit to Stand;Stand to Sit Sit to Stand: 4: Min assist;With upper extremity assist;From bed Stand to Sit: 4: Min guard;With upper extremity assist;To chair/3-in-1 Details for Transfer Assistance: verbal cues for technique and safety Ambulation/Gait Ambulation/Gait Assistance: 4: Min guard Ambulation Distance (Feet): 80 Feet Assistive device: Rolling walker Ambulation/Gait Assistance Details: verbal cues for posture and RW distance Gait Pattern: Step-to pattern;Decreased stance time - right;Antalgic    Shoulder Instructions     Exercises     PT Diagnosis: Difficulty walking;Acute pain  PT Problem List: Decreased strength;Decreased activity tolerance;Decreased mobility;Decreased knowledge of  use of DME;Decreased safety awareness;Pain;Decreased knowledge of precautions;Decreased range of motion PT Treatment Interventions: DME instruction;Gait training;Stair training;Therapeutic activities;Functional mobility training;Therapeutic exercise;Patient/family education   PT Goals Acute Rehab PT Goals PT Goal Formulation: With patient Time For Goal Achievement: 12/09/11 Potential to Achieve Goals: Good Pt will go Sit to Supine/Side: with modified independence PT Goal: Sit to Supine/Side - Progress: Goal set today Pt will go Sit to Stand: with supervision PT Goal: Sit to Stand - Progress: Goal set today Pt will go Stand to Sit: with supervision PT Goal: Stand to Sit - Progress: Goal set today Pt will Ambulate: 51 - 150 feet;with supervision;with least restrictive assistive device PT Goal: Ambulate - Progress: Goal set today Pt will Perform Home Exercise Program: with supervision, verbal cues required/provided PT Goal: Perform Home Exercise Program - Progress: Goal set today  Visit Information  Last PT Received On: 12/02/11 Assistance Needed: +1    Subjective Data  Subjective: I'm going to rehab.   Prior Functioning  Home Living Lives With: Spouse Available Help at Discharge: Skilled Nursing Facility Home Adaptive Equipment: Bedside commode/3-in-1;Walker - rolling;Crutches Prior Function Level of Independence: Independent Communication Communication: No difficulties    Cognition  Overall Cognitive Status: Appears within functional limits for tasks assessed/performed Arousal/Alertness: Awake/alert Orientation Level: Appears intact for tasks assessed Behavior During Session: [facility] for tasks performed    Extremity/Trunk Assessment Right Upper Extremity Assessment RUE ROM/Strength/Tone: [facility] for tasks assessed Left Upper Extremity Assessment LUE ROM/Strength/Tone: WFL for tasks assessed Right Lower Extremity Assessment RLE ROM/Strength/Tone: Deficits RLE ROM/Strength/Tone  Deficits: good quad contraction, ROM TBA Left Lower Extremity Assessment LLE ROM/Strength/Tone: WFL for tasks assessed   Balance    End of Session PT -  End of Session Equipment Utilized During Treatment: Right knee immobilizer Activity Tolerance: Patient tolerated treatment well Patient left: with call bell/phone within reach;in chair  GP     Tasmia Blumer,KATHrine E 12/02/2011, 1:09 PM Pager: [phone]

## 2011-12-02 NOTE — Clinical Social Work Placement (Signed)
     Clinical Social Work Department CLINICAL SOCIAL WORK PLACEMENT NOTE 12/02/2011  Patient:  Luis Herrera, Luis Herrera  Account Number:  [id number] Admit date:  12/01/2011  Clinical Social Worker:  Robin Searing  Date/time:  12/02/2011 02:36 PM  Clinical Social Work is seeking post-discharge placement for this patient at the following level of care:   SKILLED NURSING   (*CSW will update this form in Epic as items are completed)   12/02/2011  Patient/family provided with [facility] Department of Clinical Social Works list of facilities offering this level of care within the geographic area requested by the patient (or if unable, by the patients family).  12/02/2011  Patient/family informed of their freedom to choose among providers that offer the needed level of care, that participate in Medicare, Medicaid or managed care program needed by the patient, have an available bed and are willing to accept the patient.  12/02/2011  Patient/family informed of [facility] ownership interest in [facility], as well as of the fact that they are under no obligation to receive care at this facility.  PASARR submitted to EDS on 12/02/2011 PASARR number received from EDS on   FL2 transmitted to all facilities in geographic area requested by pt/family on  12/02/2011 FL2 transmitted to all facilities within larger geographic area on   Patient informed that his/her managed care company has contracts with or will negotiate with  certain facilities, including the following:     Patient/family informed of bed offers received:   Patient chooses bed at  Physician recommends and patient chooses bed at    Patient to be transferred to  on   Patient to be transferred to facility by   The following physician request were entered in Epic:   Additional Comments:

## 2011-12-02 NOTE — Clinical Social Work Psychosocial (Signed)
     Clinical Social Work Department BRIEF PSYCHOSOCIAL ASSESSMENT 12/02/2011  Patient:  Luis Herrera, Luis Herrera     Account Number:  [id number]     Admit date:  12/01/2011  Clinical Social Worker:  Robin Searing  Date/Time:  12/02/2011 12:00 M  Referred by:  Physician  Date Referred:  12/01/2011 Referred for  SNF Placement   Other Referral:   Interview type:  Patient Other interview type:    PSYCHOSOCIAL DATA Living Status:  ALONE Admitted from facility:   Level of care:   Primary support name:  SPOUSE Primary support relationship to patient:  FAMILY Degree of support available:   GOOD    CURRENT CONCERNS Current Concerns  Post-Acute Placement   Other Concerns:    SOCIAL WORK ASSESSMENT / PLAN Patient requesting SNF at d/c- is interested in [facility] where he has pre-registered.   Assessment/plan status:  Other - See comment Other assessment/ plan:   Information/referral to community resources:   SNF    PATIENTS/FAMILYS RESPONSE TO PLAN OF CARE: Patient agreeable to plans and appreciative of CSW assistance

## 2011-12-03 LAB — BASIC METABOLIC PANEL
BUN: 21 mg/dL (ref 6–23)
Calcium: 8.4 mg/dL (ref 8.4–10.5)
Chloride: 103 mEq/L (ref 96–112)
Creatinine, Ser: 0.81 mg/dL (ref 0.50–1.35)
GFR calc Af Amer: >90 mL/min (ref >90)

## 2011-12-03 LAB — CBC
HCT: 25.5 % — ABNORMAL LOW (ref 39.0–52.0)
MCH: 30.1 pg (ref 26.0–34.0)
MCHC: 33.7 g/dL (ref 30.0–36.0)
MCV: 89.2 fL (ref 78.0–100.0)
Platelets: 140 10*3/uL — ABNORMAL LOW (ref 150–400)
RDW: 14.1 % (ref 11.5–15.5)
WBC: 11.7 10*3/uL — ABNORMAL HIGH (ref 4.0–10.5)

## 2011-12-03 MED ORDER — ASPIRIN 325 MG PO TABS: 325.0000 mg | ORAL_TABLET | Freq: Two times a day (BID) | ORAL | Status: DC

## 2011-12-03 MED ORDER — DSS 100 MG PO CAPS: 100.0000 mg | ORAL_CAPSULE | Freq: Two times a day (BID) | ORAL | Status: DC

## 2011-12-03 MED ORDER — POLYETHYLENE GLYCOL 3350 17 G PO PACK: 17.0000 g | PACK | Freq: Two times a day (BID) | ORAL | Status: DC

## 2011-12-03 MED ORDER — FERROUS SULFATE 325 (65 FE) MG PO TABS: 325.0000 mg | ORAL_TABLET | Freq: Three times a day (TID) | ORAL | Status: DC

## 2011-12-03 MED ORDER — METHOCARBAMOL 500 MG PO TABS: 500.0000 mg | ORAL_TABLET | Freq: Four times a day (QID) | ORAL | Status: DC | PRN

## 2011-12-03 MED ORDER — RIVAROXABAN 10 MG PO TABS: 10.0000 mg | ORAL_TABLET | ORAL | Status: DC

## 2011-12-03 MED ORDER — FUROSEMIDE 40 MG PO TABS: 40.0000 mg | ORAL_TABLET | Freq: Every day | ORAL | Status: DC

## 2011-12-03 MED ORDER — HYDROCODONE-ACETAMINOPHEN 7.5-325 MG PO TABS: 1.0000 | ORAL_TABLET | ORAL | Status: DC | PRN

## 2011-12-03 MED ORDER — DIPHENHYDRAMINE HCL 25 MG PO CAPS: 25.0000 mg | ORAL_CAPSULE | Freq: Four times a day (QID) | ORAL | Status: DC | PRN

## 2011-12-03 NOTE — Care Management Note (Signed)
    Page 1 of 2   12/03/2011     2:14:47 PM   CARE MANAGEMENT NOTE 12/03/2011  Patient:  LEARY, MCNULTY   Account Number:  [id number]  Date Initiated:  12/03/2011  Documentation initiated by:  Colleen Can  Subjective/Objective Assessment:   dx rt knee osteoarthritis; total knee replacemnt     Action/Plan:   Plans are for SNF rehab   Anticipated DC Date:  12/04/2011   Anticipated DC Plan:  SKILLED NURSING FACILITY  In-house referral  Clinical Social Worker      DC Planning Services  CM consult      [facility] Choice  NA   Choice offered to / List presented to:  NA   DME arranged  NA      DME agency  NA     HH arranged  NA      HH agency  NA   Status of service:  Completed, signed off Medicare Important Message given?  NA - LOS <3 / Initial given by admissions (If response is "NO", the following Medicare IM given date fields will be blank) Date Medicare IM given:   Date Additional Medicare IM given:    Discharge Disposition:    Per UR Regulation:    If discussed at Long Length of Stay Meetings, dates discussed:    Comments:

## 2011-12-03 NOTE — Progress Notes (Signed)
Physical Therapy Treatment Note   12/03/11 1400  PT Visit Information  Last PT Received On 12/03/11  Assistance Needed +1  PT Time Calculation  PT Start Time 1359  PT Stop Time 1415  PT Time Calculation (min) 16 min  Subjective Data  Subjective I think it got stiff (knee from sitting in recliner)  Precautions  Precautions Knee  Restrictions  RLE Weight Bearing WBAT  Cognition  Overall Cognitive Status Appears within functional limits for tasks assessed/performed  Bed Mobility  Sit to Supine 6: Modified independent (Device/Increase time)  Transfers  Transfers Sit to Stand;Stand to Sit  Sit to Stand 4: Min guard;With upper extremity assist;From chair/3-in-1;From bed  Stand to Sit 4: Min guard;With upper extremity assist;To bed  Details for Transfer Assistance verbal cue for R LE forward  Ambulation/Gait  Ambulation/Gait Assistance 4: Min guard  Ambulation Distance (Feet) 150 Feet  Assistive device Rolling walker  Ambulation/Gait Assistance Details verbal cue again for heel strike  Gait Pattern Step-to pattern;Decreased stance time - right;Antalgic  Total Joint Exercises  Quad Sets AROM;Both;20 reps  Heel Slides AAROM;Right;15 reps;Seated  PT - End of Session  Activity Tolerance Patient tolerated treatment well  Patient left in bed;with call bell/phone within reach;with family/visitor present  PT - Assessment/Plan  Comments on Treatment Session Pt ambulated again and reports stiff knee this afternoon so afternoon so also performed knee extension and flexion.  PT Plan Discharge plan remains appropriate;Frequency remains appropriate  Follow Up Recommendations Post acute inpatient  Does the patient have the potential to tolerate intense rehabilitation? No, Recommend SNF  Equipment Recommended None recommended by PT  Acute Rehab PT Goals  PT Goal: Sit to Stand - Progress Progressing toward goal  PT Goal: Stand to Sit - Progress Progressing toward goal  PT Goal: Ambulate -  Progress Progressing toward goal  PT Goal: Perform Home Exercise Program - Progress Progressing toward goal  PT General Charges  $$ ACUTE PT VISIT 1 Procedure  PT Treatments  $Gait Training 8-22 mins   Pain: 3/10 R knee, reports more soreness then pain, ice applied  Zenovia Jarred, PT Pager: [phone]

## 2011-12-03 NOTE — Discharge Summary (Signed)
Physician Discharge Summary  Patient ID: Luis Herrera MRN: [id number] DOB/AGE: 1940-09-01 71 y.o.  Admit date: 12/01/2011 Discharge date:  12/04/2011   Procedures:  Procedure(s) (LRB): TOTAL KNEE ARTHROPLASTY (Right)  Attending Physician:  Dr. Durene Romans   Admission Diagnoses:   Right knee pain / OA  Discharge Diagnoses:  Principal Problem:  *S/P right TKA Active Problems:  Expected blood loss anemia  Overweight (BMI 25.0-29.9) Hyperlipidemia  Hypertension   Hx of colonic polyps   CAD (coronary artery disease)   Aortic stenosis   Aortic valve disease   Arthritis   H/O hiatal hernia    HPI: Luis Herrera, 71 y.o. male, has a history of pain and functional disability in the right knee due to arthritis and has failed non-surgical conservative treatments for greater than 12 weeks to includeNSAID's and/or analgesics and activity modification. Onset of symptoms was gradual, starting >10 years ago with gradually worsening course since that time. The patient noted no past surgery on the right knee(s). Patient currently rates pain in the right knee(s) at 5 out of 10 with activity. Patient has worsening of pain with activity and weight bearing, pain that interferes with activities of daily living and crepitus. Patient has evidence of periarticular osteophytes and joint space narrowing by imaging studies. Risks, benefits and expectations were discussed with the patient. Patient understand the risks, benefits and expectations and wishes to proceed with surgery.  PCP: Sanda Linger, MD   Discharged Condition: good  Hospital Course:  Patient underwent the above stated procedure on 12/01/2011. Patient tolerated the procedure well and brought to the recovery room in good condition and subsequently to the floor.  POD #1 BP: 108/65 ; Pulse: 63 ; Temp: 97.6 F (36.4 C) ; Resp: 14 Pt's foley was removed, as well as the hemovac drain removed. IV was changed to a saline lock. Patient reports  pain as mild, pain well controlled. No events throughout the night.  Neurovascular intact, dorsiflexion/plantar flexion intact, incision: dressing C/D/I, no cellulitis present and compartment soft.   LABS  Basename  12/02/11 0345  HGB  9.7  HCT  29.8   POD #2  BP: 134/96 ; Pulse: 67 ; Temp: 97.9 F (36.6 C) ; Resp: 16  Patient reports pain as mild, pain well controlled. No events throughout the night. Neurovascular intact, dorsiflexion/plantar flexion intact, incision: dressing C/D/I, no cellulitis present and compartment soft.   LABS  Basename  12/03/11 0358   HGB  8.6  HCT  25.5   POD #3  BP: 120/72 ; Pulse: 66 ; Temp: 97.6 F (36.4 C) ; Resp: 16  Patient reports pain as mild, pain well controlled. Little sore from all the PT. No events throughout the night. Ready to be discharged to SNF. Neurovascular intact, dorsiflexion/plantar flexion intact, incision: dressing C/D/I, no cellulitis present and compartment soft.   LABS   No new labs   Discharge Exam: General appearance: alert, cooperative and no distress Extremities: Homans sign is negative, no sign of DVT, no edema, redness or tenderness in the calves or thighs and no ulcers, gangrene or trophic changes  Disposition: SNF with follow up in 2 weeks   Follow-up Information    Follow up with Shelda Pal, MD. In 2 weeks.   Contact information:   [facility] [address] [place] [address] [phone]          Discharge Orders    Future Appointments: Provider: Department: Dept Phone: Center:   02/15/2012 10:00 AM  Etta Grandchild, MD [facility] [phone] [facility]     Future Orders Please Complete By Expires   Diet - low sodium heart healthy      Call MD / Call 911      Comments:   If you experience chest pain or shortness of breath, CALL 911 and be transported to the hospital emergency room.  If you develope a fever above 101 F, pus (white drainage) or increased drainage or  redness at the wound, or calf pain, call your surgeon's office.   Discharge instructions      Comments:   Maintain surgical dressing for 10-14 days, then replace with gauze and tape. Keep the area dry and clean until follow up. Follow up in 2 weeks at [facility]. Call with any questions or concerns.   Constipation Prevention      Comments:   Drink plenty of fluids.  Prune juice may be helpful.  You may use a stool softener, such as Colace (over the counter) 100 mg twice a day.  Use MiraLax (over the counter) for constipation as needed.   Increase activity slowly as tolerated      Driving restrictions      Comments:   No driving for 4 weeks   TED hose      Comments:   Use stockings (TED hose) for 2 weeks on both leg(s).  You may remove them at night for sleeping.   Change dressing      Comments:   Maintain surgical dressing for 10-14 days, then change the dressing daily with sterile 4 x 4 inch gauze dressing and tape. Keep the area dry and clean.      Current Discharge Medication List    START taking these medications   Details  diphenhydrAMINE (BENADRYL) 25 mg capsule Take 1 capsule (25 mg total) by mouth every 6 (six) hours as needed for itching, allergies or sleep. Qty: 30 capsule    docusate sodium 100 MG CAPS Take 100 mg by mouth 2 (two) times daily. Qty: 10 capsule    ferrous sulfate 325 (65 FE) MG tablet Take 1 tablet (325 mg total) by mouth 3 (three) times daily after meals.    furosemide (LASIX) 40 MG tablet Take 1 tablet (40 mg total) by mouth daily. Qty: 30 tablet    HYDROcodone-acetaminophen (NORCO) 7.5-325 MG per tablet Take 1-2 tablets by mouth every 4 (four) hours as needed for pain. Qty: 120 tablet, Refills: 0    methocarbamol (ROBAXIN) 500 MG tablet Take 1 tablet (500 mg total) by mouth every 6 (six) hours as needed (muscle spasms). Qty: 50 tablet, Refills: 0    polyethylene glycol (MIRALAX / GLYCOLAX) packet Take 17 g by mouth 2 (two) times  daily. Qty: 14 each    rivaroxaban (XARELTO) 10 MG TABS tablet Take 1 tablet (10 mg total) by mouth daily. Qty: 12 tablet, Refills: 0      CONTINUE these medications which have CHANGED   Details  aspirin 325 MG tablet Take 1 tablet (325 mg total) by mouth 2 (two) times daily. Will stop 11/26/11   Comments: Start the day after finishing the Xarelto.      CONTINUE these medications which have NOT CHANGED   Details  atorvastatin (LIPITOR) 80 MG tablet Take 80 mg by mouth at bedtime.    carvedilol (COREG) 6.25 MG tablet Take 6.25 mg by mouth 2 (two) times daily. HOLD until further notice   11/25/11-  States taking BID    Multiple  Vitamin (MULTIVITAMIN WITH MINERALS) TABS Take 1 tablet by mouth daily.    olmesartan-hydrochlorothiazide (BENICAR HCT) 40-12.5 MG per tablet Take 0.5 tablets by mouth daily before breakfast. HOLD until further notice   11/25/11   States  Is taking this as written         Signed:  Anastasio Auerbach. Porshia Blizzard   PAC  12/04/2011, 7:48 AM

## 2011-12-03 NOTE — Progress Notes (Signed)
OT Screen Note  Patient Details Name: Luis Herrera MRN: [id number] DOB: Jul 10, 1940   Cancelled Treatment:    Reason Eval/Treat Not Completed: Other (comment) (OT screen) Pt states he is familiar with ADL tasks from previous knee surgery and has already been up to the bathroom without difficulty. Will defer OT eval per pt request.  Lennox Laity [phone] 12/03/2011, 10:40 AM

## 2011-12-03 NOTE — Progress Notes (Signed)
SNF bed confirmed for patient at [facility]- per PA, will plan for transfer tomorrow- CSW covering to f/u tomorrow to finalize d/c plans., Reece Levy, MSW, Theresia Majors [phone]

## 2011-12-03 NOTE — Progress Notes (Signed)
Physical Therapy Treatment Patient Details Name: Brecken Dewoody MRN: [id number] DOB: 11-Jul-1940 Today's Date: 12/03/2011 Time: 4098-1191 PT Time Calculation (min): 23 min  PT Assessment / Plan / Recommendation Comments on Treatment Session  Pt ambulated and performed exercises.     Follow Up Recommendations  Post acute inpatient     Does the patient have the potential to tolerate intense rehabilitation  No, Recommend SNF  Barriers to Discharge        Equipment Recommendations  None recommended by PT    Recommendations for Other Services    Frequency     Plan Discharge plan remains appropriate;Frequency remains appropriate    Precautions / Restrictions Precautions Precautions: Knee Restrictions RLE Weight Bearing: Weight bearing as tolerated   Pertinent Vitals/Pain 2/10 R knee, ice applied, repositioned    Mobility  Bed Mobility Bed Mobility: Supine to Sit;Sit to Supine Supine to Sit: 6: Modified independent (Device/Increase time) Sit to Supine: 6: Modified independent (Device/Increase time) Transfers Transfers: Sit to Stand;Stand to Sit Sit to Stand: 4: Min guard;With upper extremity assist;From chair/3-in-1;From bed Stand to Sit: 4: Min guard;With upper extremity assist;To bed;To chair/3-in-1 Details for Transfer Assistance: verbal cue for R LE forward Ambulation/Gait Ambulation/Gait Assistance: 4: Min guard Ambulation Distance (Feet): 120 Feet Assistive device: Rolling walker Ambulation/Gait Assistance Details: verbal cues for heel strike Gait Pattern: Step-to pattern;Decreased stance time - right;Antalgic    Exercises Total Joint Exercises Ankle Circles/Pumps: AROM;20 reps;Both Quad Sets: AROM;Both;20 reps Gluteal Sets: AROM;20 reps;Both Towel Squeeze: AROM;Both;20 reps Short Arc Quad: AROM;Strengthening;Right;20 reps Heel Slides: AAROM;Right;15 reps;Seated Hip ABduction/ADduction: AROM;Strengthening;Right;20 reps Straight Leg Raises: AROM;Right;10  reps Goniometric ROM: R knee AAROM -1-85* with heel slides   PT Diagnosis:    PT Problem List:   PT Treatment Interventions:     PT Goals Acute Rehab PT Goals PT Goal: Sit to Supine/Side - Progress: Met PT Goal: Sit to Stand - Progress: Progressing toward goal PT Goal: Stand to Sit - Progress: Progressing toward goal PT Goal: Ambulate - Progress: Progressing toward goal PT Goal: Perform Home Exercise Program - Progress: Progressing toward goal  Visit Information  Last PT Received On: 12/03/11 Assistance Needed: +1    Subjective Data  Subjective: I need to use the bathroom.   Cognition  Overall Cognitive Status: Appears within functional limits for tasks assessed/performed    Balance     End of Session PT - End of Session Activity Tolerance: Patient tolerated treatment well Patient left: in chair;with call bell/phone within reach   GP     [facility] E 12/03/2011, 12:16 PM Pager: [phone]

## 2011-12-03 NOTE — Progress Notes (Signed)
   Subjective: 2 Days Post-Op Procedure(s) (LRB): TOTAL KNEE ARTHROPLASTY (Right)   Patient reports pain as mild, pain well controlled. No events throughout the night.  Objective:   VITALS:   Filed Vitals:   12/03/11 0800  BP: 134/96  Pulse: 67  Temp: 97.9 F (36.6 C)   Resp: 16    Neurovascular intact Dorsiflexion/Plantar flexion intact Incision: dressing C/D/I No cellulitis present Compartment soft  LABS  Basename 12/03/11 0358 12/02/11 0345  HGB 8.6* 9.7*  HCT 25.5* 29.8*  WBC 11.7* 10.3  PLT 140* 156     Basename 12/03/11 0358 12/02/11 0345  NA 136 137  K 4.0 4.2  BUN 21 18  CREATININE 0.81 0.82  GLUCOSE 157* 108*     Assessment/Plan: 2 Days Post-Op Procedure(s) (LRB): TOTAL KNEE ARTHROPLASTY (Right) Up with therapy Discharge to SNF eventually, Friday if progresses as planned. ACE bandage removed   Expected ABLA  Treated with iron and will observe    Overweight (BMI 25-29.9)  Estimated Body mass index is 28.31 kg/(m^2) as calculated from the following:  Height as of this encounter: 5\' 11" (1.803 m).  Weight as of this encounter: 203 lb(92.08 kg).  Patient also counseled that weight may inhibit the healing process  Patient counseled that losing weight will help with future health issues   Anastasio Auerbach. Ebin Palazzi   PAC  12/03/2011, 9:21 AM

## 2011-12-04 DIAGNOSIS — I2510 Atherosclerotic heart disease of native coronary artery without angina pectoris: Secondary | ICD-10-CM | POA: Diagnosis not present

## 2011-12-04 DIAGNOSIS — D62 Acute posthemorrhagic anemia: Secondary | ICD-10-CM | POA: Diagnosis not present

## 2011-12-04 DIAGNOSIS — M1710 Unilateral primary osteoarthritis, unspecified knee: Secondary | ICD-10-CM | POA: Diagnosis not present

## 2011-12-04 DIAGNOSIS — I10 Essential (primary) hypertension: Secondary | ICD-10-CM | POA: Diagnosis not present

## 2011-12-04 DIAGNOSIS — Z96659 Presence of unspecified artificial knee joint: Secondary | ICD-10-CM | POA: Diagnosis not present

## 2011-12-04 DIAGNOSIS — D509 Iron deficiency anemia, unspecified: Secondary | ICD-10-CM | POA: Diagnosis not present

## 2011-12-04 DIAGNOSIS — M4714 Other spondylosis with myelopathy, thoracic region: Secondary | ICD-10-CM | POA: Diagnosis not present

## 2011-12-04 DIAGNOSIS — S8990XA Unspecified injury of unspecified lower leg, initial encounter: Secondary | ICD-10-CM | POA: Diagnosis not present

## 2011-12-04 DIAGNOSIS — E785 Hyperlipidemia, unspecified: Secondary | ICD-10-CM | POA: Diagnosis not present

## 2011-12-04 DIAGNOSIS — D649 Anemia, unspecified: Secondary | ICD-10-CM | POA: Diagnosis not present

## 2011-12-04 DIAGNOSIS — Z5189 Encounter for other specified aftercare: Secondary | ICD-10-CM | POA: Diagnosis not present

## 2011-12-04 DIAGNOSIS — K5900 Constipation, unspecified: Secondary | ICD-10-CM | POA: Diagnosis not present

## 2011-12-04 DIAGNOSIS — M25569 Pain in unspecified knee: Secondary | ICD-10-CM | POA: Diagnosis not present

## 2011-12-04 NOTE — Progress Notes (Signed)
Pt for d/c to SNF-[facility] today. IV d/c'd. Dressing-AquacelI to R knee CDI.  Pain under control with pain meds. No changes on am assessments. Transported to SNF per [facility]. Son at bedside and aware of SNF d/c today. D/C packet carried by transporter.

## 2011-12-04 NOTE — Progress Notes (Signed)
   Subjective: 3 Days Post-Op Procedure(s) (LRB): TOTAL KNEE ARTHROPLASTY (Right)   Patient reports pain as mild, pain well controlled. Little sore from all the PT. No events throughout the night. Ready to be discharged to SNF.  Objective:   VITALS:   Filed Vitals:   12/04/11 0441  BP: 120/72  Pulse: 66  Temp: 97.6 F (36.4 C)  Resp: 16    Neurovascular intact Dorsiflexion/Plantar flexion intact Incision: dressing C/D/I No cellulitis present Compartment soft  LABS  Basename 12/03/11 0358 12/02/11 0345  HGB 8.6* 9.7*  HCT 25.5* 29.8*  WBC 11.7* 10.3  PLT 140* 156     Basename 12/03/11 0358 12/02/11 0345  NA 136 137  K 4.0 4.2  BUN 21 18  CREATININE 0.81 0.82  GLUCOSE 157* 108*     Assessment/Plan: 3 Days Post-Op Procedure(s) (LRB): TOTAL KNEE ARTHROPLASTY (Right) Up with therapy Discharge to SNF Follow up in 2 weeks at [facility]. Follow-up Information    Follow up with OLIN,[name] in 2 weeks.   Contact information:   [facility] [address], [address] [name] [address] [phone]         Expected ABLA  Treated with iron and will observe    Overweight (BMI 25-29.9)  Estimated Body mass index is 28.31 kg/(m^2) as calculated from the following:  Height as of this encounter: 5\' 11" (1.803 m).  Weight as of this encounter: 203 lb(92.08 kg).  Patient also counseled that weight may inhibit the healing process  Patient counseled that losing weight will help with future health issues     Luis Herrera   PAC  12/04/2011, 7:45 AM

## 2011-12-04 NOTE — Progress Notes (Signed)
Patient cleared for discharge. Packet copied and placed in [facility]. ptar called for transportation.  Marjie Chea C. Laquana Villari MSW, LCSW [phone]  

## 2011-12-04 NOTE — Progress Notes (Signed)
Physical Therapy Treatment Patient Details Name: Luis Herrera MRN: [id number] DOB: [birth year] Today's Date: 12/04/2011 Time: 2841-3244 PT Time Calculation (min): 12 min  PT Assessment / Plan / Recommendation Comments on Treatment Session  Pt reporting increased soreness ("not pain really, just sore/uncomfortable") and requested only ambulation today and no exercises as he will be d/c to SNF today.    Follow Up Recommendations  Post acute inpatient     Does the patient have the potential to tolerate intense rehabilitation  No, Recommend SNF  Barriers to Discharge        Equipment Recommendations  None recommended by PT    Recommendations for Other Services    Frequency     Plan Discharge plan remains appropriate;Frequency remains appropriate    Precautions / Restrictions Precautions Precautions: Knee Restrictions Weight Bearing Restrictions: Yes RLE Weight Bearing: Weight bearing as tolerated   Pertinent Vitals/Pain 3/10 R knee "soreness", ice applied    Mobility  Bed Mobility Bed Mobility: Supine to Sit;Sit to Supine Supine to Sit: 6: Modified independent (Device/Increase time) Sit to Supine: 6: Modified independent (Device/Increase time) Transfers Transfers: Sit to Stand;Stand to Sit Sit to Stand: 5: Supervision;With upper extremity assist;From bed Stand to Sit: 5: Supervision;With upper extremity assist;To chair/3-in-1 Details for Transfer Assistance: no verbal cues, supervision due to pt reporting increased soreness and stiffness Ambulation/Gait Ambulation/Gait Assistance: 5: Supervision Ambulation Distance (Feet): 200 Feet Assistive device: Rolling walker Ambulation/Gait Assistance Details: verbal cue for decreasing external rotation of R LE Gait Pattern: Step-to pattern;Decreased stance time - right;Antalgic    Exercises     PT Diagnosis:    PT Problem List:   PT Treatment Interventions:     PT Goals Acute Rehab PT Goals PT Goal: Sit to Stand - Progress:  Met PT Goal: Stand to Sit - Progress: Met PT Goal: Ambulate - Progress: Met  Visit Information  Last PT Received On: 12/04/11 Assistance Needed: +1    Subjective Data  Subjective: It's really sore today.  Can we just walk?   Cognition  Overall Cognitive Status: Appears within functional limits for tasks assessed/performed    Balance     End of Session PT - End of Session Activity Tolerance: Patient tolerated treatment well Patient left: in bed;with call bell/phone within reach;with family/visitor present   GP     Molleigh Huot,KATHrine E 12/04/2011, 10:47 AM Pager: [phone]

## 2011-12-09 DIAGNOSIS — I10 Essential (primary) hypertension: Secondary | ICD-10-CM | POA: Diagnosis not present

## 2011-12-09 DIAGNOSIS — E785 Hyperlipidemia, unspecified: Secondary | ICD-10-CM | POA: Diagnosis not present

## 2011-12-09 DIAGNOSIS — K5900 Constipation, unspecified: Secondary | ICD-10-CM | POA: Diagnosis not present

## 2011-12-09 DIAGNOSIS — D62 Acute posthemorrhagic anemia: Secondary | ICD-10-CM | POA: Diagnosis not present

## 2011-12-11 DIAGNOSIS — I10 Essential (primary) hypertension: Secondary | ICD-10-CM | POA: Diagnosis not present

## 2011-12-11 DIAGNOSIS — D509 Iron deficiency anemia, unspecified: Secondary | ICD-10-CM | POA: Diagnosis not present

## 2011-12-11 DIAGNOSIS — E785 Hyperlipidemia, unspecified: Secondary | ICD-10-CM | POA: Diagnosis not present

## 2011-12-11 DIAGNOSIS — K5900 Constipation, unspecified: Secondary | ICD-10-CM | POA: Diagnosis not present

## 2011-12-11 DIAGNOSIS — I2510 Atherosclerotic heart disease of native coronary artery without angina pectoris: Secondary | ICD-10-CM | POA: Diagnosis not present

## 2011-12-16 DIAGNOSIS — Z96659 Presence of unspecified artificial knee joint: Secondary | ICD-10-CM | POA: Diagnosis not present

## 2011-12-16 DIAGNOSIS — IMO0001 Reserved for inherently not codable concepts without codable children: Secondary | ICD-10-CM | POA: Diagnosis not present

## 2011-12-16 DIAGNOSIS — I2510 Atherosclerotic heart disease of native coronary artery without angina pectoris: Secondary | ICD-10-CM | POA: Diagnosis not present

## 2011-12-16 DIAGNOSIS — I10 Essential (primary) hypertension: Secondary | ICD-10-CM | POA: Diagnosis not present

## 2011-12-16 DIAGNOSIS — Z471 Aftercare following joint replacement surgery: Secondary | ICD-10-CM | POA: Diagnosis not present

## 2011-12-17 DIAGNOSIS — Z471 Aftercare following joint replacement surgery: Secondary | ICD-10-CM | POA: Diagnosis not present

## 2011-12-17 DIAGNOSIS — IMO0001 Reserved for inherently not codable concepts without codable children: Secondary | ICD-10-CM | POA: Diagnosis not present

## 2011-12-17 DIAGNOSIS — I2510 Atherosclerotic heart disease of native coronary artery without angina pectoris: Secondary | ICD-10-CM | POA: Diagnosis not present

## 2011-12-17 DIAGNOSIS — I10 Essential (primary) hypertension: Secondary | ICD-10-CM | POA: Diagnosis not present

## 2011-12-17 DIAGNOSIS — Z96659 Presence of unspecified artificial knee joint: Secondary | ICD-10-CM | POA: Diagnosis not present

## 2011-12-18 DIAGNOSIS — IMO0001 Reserved for inherently not codable concepts without codable children: Secondary | ICD-10-CM | POA: Diagnosis not present

## 2011-12-18 DIAGNOSIS — I10 Essential (primary) hypertension: Secondary | ICD-10-CM | POA: Diagnosis not present

## 2011-12-18 DIAGNOSIS — I2510 Atherosclerotic heart disease of native coronary artery without angina pectoris: Secondary | ICD-10-CM | POA: Diagnosis not present

## 2011-12-18 DIAGNOSIS — Z96659 Presence of unspecified artificial knee joint: Secondary | ICD-10-CM | POA: Diagnosis not present

## 2011-12-18 DIAGNOSIS — Z471 Aftercare following joint replacement surgery: Secondary | ICD-10-CM | POA: Diagnosis not present

## 2011-12-21 DIAGNOSIS — Z471 Aftercare following joint replacement surgery: Secondary | ICD-10-CM | POA: Diagnosis not present

## 2011-12-21 DIAGNOSIS — I10 Essential (primary) hypertension: Secondary | ICD-10-CM | POA: Diagnosis not present

## 2011-12-21 DIAGNOSIS — IMO0001 Reserved for inherently not codable concepts without codable children: Secondary | ICD-10-CM | POA: Diagnosis not present

## 2011-12-21 DIAGNOSIS — I2510 Atherosclerotic heart disease of native coronary artery without angina pectoris: Secondary | ICD-10-CM | POA: Diagnosis not present

## 2011-12-21 DIAGNOSIS — Z96659 Presence of unspecified artificial knee joint: Secondary | ICD-10-CM | POA: Diagnosis not present

## 2011-12-22 DIAGNOSIS — Z96659 Presence of unspecified artificial knee joint: Secondary | ICD-10-CM | POA: Diagnosis not present

## 2011-12-22 DIAGNOSIS — Z471 Aftercare following joint replacement surgery: Secondary | ICD-10-CM | POA: Diagnosis not present

## 2011-12-22 DIAGNOSIS — I2510 Atherosclerotic heart disease of native coronary artery without angina pectoris: Secondary | ICD-10-CM | POA: Diagnosis not present

## 2011-12-22 DIAGNOSIS — IMO0001 Reserved for inherently not codable concepts without codable children: Secondary | ICD-10-CM | POA: Diagnosis not present

## 2011-12-22 DIAGNOSIS — I10 Essential (primary) hypertension: Secondary | ICD-10-CM | POA: Diagnosis not present

## 2011-12-24 DIAGNOSIS — I10 Essential (primary) hypertension: Secondary | ICD-10-CM | POA: Diagnosis not present

## 2011-12-24 DIAGNOSIS — Z96659 Presence of unspecified artificial knee joint: Secondary | ICD-10-CM | POA: Diagnosis not present

## 2011-12-24 DIAGNOSIS — IMO0001 Reserved for inherently not codable concepts without codable children: Secondary | ICD-10-CM | POA: Diagnosis not present

## 2011-12-24 DIAGNOSIS — I2510 Atherosclerotic heart disease of native coronary artery without angina pectoris: Secondary | ICD-10-CM | POA: Diagnosis not present

## 2011-12-24 DIAGNOSIS — Z471 Aftercare following joint replacement surgery: Secondary | ICD-10-CM | POA: Diagnosis not present

## 2011-12-25 DIAGNOSIS — Z471 Aftercare following joint replacement surgery: Secondary | ICD-10-CM | POA: Diagnosis not present

## 2011-12-25 DIAGNOSIS — I2510 Atherosclerotic heart disease of native coronary artery without angina pectoris: Secondary | ICD-10-CM | POA: Diagnosis not present

## 2011-12-25 DIAGNOSIS — I10 Essential (primary) hypertension: Secondary | ICD-10-CM | POA: Diagnosis not present

## 2011-12-25 DIAGNOSIS — Z96659 Presence of unspecified artificial knee joint: Secondary | ICD-10-CM | POA: Diagnosis not present

## 2011-12-25 DIAGNOSIS — IMO0001 Reserved for inherently not codable concepts without codable children: Secondary | ICD-10-CM | POA: Diagnosis not present

## 2011-12-26 DIAGNOSIS — I2510 Atherosclerotic heart disease of native coronary artery without angina pectoris: Secondary | ICD-10-CM | POA: Diagnosis not present

## 2011-12-26 DIAGNOSIS — Z471 Aftercare following joint replacement surgery: Secondary | ICD-10-CM | POA: Diagnosis not present

## 2011-12-26 DIAGNOSIS — I10 Essential (primary) hypertension: Secondary | ICD-10-CM | POA: Diagnosis not present

## 2011-12-26 DIAGNOSIS — IMO0001 Reserved for inherently not codable concepts without codable children: Secondary | ICD-10-CM | POA: Diagnosis not present

## 2011-12-26 DIAGNOSIS — Z96659 Presence of unspecified artificial knee joint: Secondary | ICD-10-CM | POA: Diagnosis not present

## 2011-12-28 DIAGNOSIS — Z471 Aftercare following joint replacement surgery: Secondary | ICD-10-CM | POA: Diagnosis not present

## 2011-12-28 DIAGNOSIS — IMO0001 Reserved for inherently not codable concepts without codable children: Secondary | ICD-10-CM | POA: Diagnosis not present

## 2011-12-28 DIAGNOSIS — Z96659 Presence of unspecified artificial knee joint: Secondary | ICD-10-CM | POA: Diagnosis not present

## 2011-12-28 DIAGNOSIS — I10 Essential (primary) hypertension: Secondary | ICD-10-CM | POA: Diagnosis not present

## 2011-12-28 DIAGNOSIS — I2510 Atherosclerotic heart disease of native coronary artery without angina pectoris: Secondary | ICD-10-CM | POA: Diagnosis not present

## 2011-12-29 DIAGNOSIS — I2510 Atherosclerotic heart disease of native coronary artery without angina pectoris: Secondary | ICD-10-CM | POA: Diagnosis not present

## 2011-12-29 DIAGNOSIS — IMO0001 Reserved for inherently not codable concepts without codable children: Secondary | ICD-10-CM | POA: Diagnosis not present

## 2011-12-29 DIAGNOSIS — Z96659 Presence of unspecified artificial knee joint: Secondary | ICD-10-CM | POA: Diagnosis not present

## 2011-12-29 DIAGNOSIS — Z471 Aftercare following joint replacement surgery: Secondary | ICD-10-CM | POA: Diagnosis not present

## 2011-12-29 DIAGNOSIS — I10 Essential (primary) hypertension: Secondary | ICD-10-CM | POA: Diagnosis not present

## 2011-12-30 DIAGNOSIS — I2510 Atherosclerotic heart disease of native coronary artery without angina pectoris: Secondary | ICD-10-CM | POA: Diagnosis not present

## 2011-12-30 DIAGNOSIS — Z96659 Presence of unspecified artificial knee joint: Secondary | ICD-10-CM | POA: Diagnosis not present

## 2011-12-30 DIAGNOSIS — I10 Essential (primary) hypertension: Secondary | ICD-10-CM | POA: Diagnosis not present

## 2011-12-30 DIAGNOSIS — IMO0001 Reserved for inherently not codable concepts without codable children: Secondary | ICD-10-CM | POA: Diagnosis not present

## 2011-12-30 DIAGNOSIS — Z471 Aftercare following joint replacement surgery: Secondary | ICD-10-CM | POA: Diagnosis not present

## 2011-12-31 DIAGNOSIS — Z471 Aftercare following joint replacement surgery: Secondary | ICD-10-CM | POA: Diagnosis not present

## 2011-12-31 DIAGNOSIS — IMO0001 Reserved for inherently not codable concepts without codable children: Secondary | ICD-10-CM | POA: Diagnosis not present

## 2011-12-31 DIAGNOSIS — Z96659 Presence of unspecified artificial knee joint: Secondary | ICD-10-CM | POA: Diagnosis not present

## 2011-12-31 DIAGNOSIS — I2510 Atherosclerotic heart disease of native coronary artery without angina pectoris: Secondary | ICD-10-CM | POA: Diagnosis not present

## 2011-12-31 DIAGNOSIS — I10 Essential (primary) hypertension: Secondary | ICD-10-CM | POA: Diagnosis not present

## 2012-01-01 DIAGNOSIS — I10 Essential (primary) hypertension: Secondary | ICD-10-CM | POA: Diagnosis not present

## 2012-01-01 DIAGNOSIS — Z471 Aftercare following joint replacement surgery: Secondary | ICD-10-CM | POA: Diagnosis not present

## 2012-01-01 DIAGNOSIS — I2510 Atherosclerotic heart disease of native coronary artery without angina pectoris: Secondary | ICD-10-CM | POA: Diagnosis not present

## 2012-01-01 DIAGNOSIS — IMO0001 Reserved for inherently not codable concepts without codable children: Secondary | ICD-10-CM | POA: Diagnosis not present

## 2012-01-01 DIAGNOSIS — Z96659 Presence of unspecified artificial knee joint: Secondary | ICD-10-CM | POA: Diagnosis not present

## 2012-01-04 DIAGNOSIS — M2040 Other hammer toe(s) (acquired), unspecified foot: Secondary | ICD-10-CM | POA: Diagnosis not present

## 2012-01-08 DIAGNOSIS — M2040 Other hammer toe(s) (acquired), unspecified foot: Secondary | ICD-10-CM | POA: Diagnosis not present

## 2012-01-12 DIAGNOSIS — M2040 Other hammer toe(s) (acquired), unspecified foot: Secondary | ICD-10-CM | POA: Diagnosis not present

## 2012-01-19 DIAGNOSIS — M2040 Other hammer toe(s) (acquired), unspecified foot: Secondary | ICD-10-CM | POA: Diagnosis not present

## 2012-01-22 DIAGNOSIS — M1710 Unilateral primary osteoarthritis, unspecified knee: Secondary | ICD-10-CM | POA: Diagnosis not present

## 2012-01-25 DIAGNOSIS — M1710 Unilateral primary osteoarthritis, unspecified knee: Secondary | ICD-10-CM | POA: Diagnosis not present

## 2012-01-28 DIAGNOSIS — M1710 Unilateral primary osteoarthritis, unspecified knee: Secondary | ICD-10-CM | POA: Diagnosis not present

## 2012-01-29 DIAGNOSIS — Z5189 Encounter for other specified aftercare: Secondary | ICD-10-CM | POA: Diagnosis not present

## 2012-02-03 DIAGNOSIS — M19079 Primary osteoarthritis, unspecified ankle and foot: Secondary | ICD-10-CM | POA: Diagnosis not present

## 2012-02-03 DIAGNOSIS — M7750 Other enthesopathy of unspecified foot: Secondary | ICD-10-CM | POA: Diagnosis not present

## 2012-02-05 DIAGNOSIS — I10 Essential (primary) hypertension: Secondary | ICD-10-CM | POA: Diagnosis not present

## 2012-02-05 DIAGNOSIS — I359 Nonrheumatic aortic valve disorder, unspecified: Secondary | ICD-10-CM | POA: Diagnosis not present

## 2012-02-15 ENCOUNTER — Ambulatory Visit: Payer: Medicare Other | Admitting: Internal Medicine

## 2012-03-20 ENCOUNTER — Other Ambulatory Visit: Payer: Self-pay | Admitting: Internal Medicine

## 2012-03-28 ENCOUNTER — Encounter (HOSPITAL_BASED_OUTPATIENT_CLINIC_OR_DEPARTMENT_OTHER): Payer: Self-pay | Admitting: *Deleted

## 2012-03-28 NOTE — Progress Notes (Signed)
Pt was here 4/13,9/13,then rt total knee-10/13-all with cardiac clearance Saw cardiology [facility] 12/13-all good-notes in [facility]  To bring all meds and overnight bag-will come in for bmet

## 2012-03-29 ENCOUNTER — Encounter (HOSPITAL_BASED_OUTPATIENT_CLINIC_OR_DEPARTMENT_OTHER)
Admission: RE | Admit: 2012-03-29 | Discharge: 2012-03-29 | Disposition: A | Discharge status: Home / Self Care | Payer: Medicare Other | Source: Ambulatory Visit | Attending: Orthopedic Surgery | Admitting: Orthopedic Surgery

## 2012-03-29 DIAGNOSIS — Z01812 Encounter for preprocedural laboratory examination: Secondary | ICD-10-CM | POA: Insufficient documentation

## 2012-03-29 LAB — BASIC METABOLIC PANEL
BUN: 20 mg/dL (ref 6–23)
Creatinine, Ser: 0.9 mg/dL (ref 0.50–1.35)
GFR calc Af Amer: >90 mL/min (ref >90)
GFR calc non Af Amer: 84 mL/min — ABNORMAL LOW (ref >90)
Glucose, Bld: 94 mg/dL (ref 70–99)

## 2012-03-30 NOTE — Progress Notes (Signed)
Cleared by Dr Fitzgerald 

## 2012-03-31 SURGERY — TARSAL METATARSAL FUSION WITH WEIL OSTEOTOMY
Anesthesia: General | Laterality: Right

## 2012-04-04 ENCOUNTER — Ambulatory Visit (HOSPITAL_BASED_OUTPATIENT_CLINIC_OR_DEPARTMENT_OTHER): Admission: RE | Admit: 2012-04-04 | Payer: Medicare Other | Source: Ambulatory Visit | Admitting: Orthopedic Surgery

## 2012-04-05 DIAGNOSIS — M19079 Primary osteoarthritis, unspecified ankle and foot: Secondary | ICD-10-CM | POA: Diagnosis not present

## 2012-04-05 DIAGNOSIS — M948X9 Other specified disorders of cartilage, unspecified sites: Secondary | ICD-10-CM | POA: Diagnosis not present

## 2012-04-18 ENCOUNTER — Emergency Department (HOSPITAL_COMMUNITY)
Admission: EM | Admit: 2012-04-18 | Discharge: 2012-04-18 | Disposition: A | Discharge status: Home / Self Care | Payer: Medicare Other | Attending: Emergency Medicine | Admitting: Emergency Medicine

## 2012-04-18 ENCOUNTER — Emergency Department (HOSPITAL_COMMUNITY): Payer: Medicare Other

## 2012-04-18 ENCOUNTER — Other Ambulatory Visit: Payer: Self-pay

## 2012-04-18 ENCOUNTER — Encounter (HOSPITAL_COMMUNITY): Payer: Self-pay | Admitting: Emergency Medicine

## 2012-04-18 DIAGNOSIS — I2510 Atherosclerotic heart disease of native coronary artery without angina pectoris: Secondary | ICD-10-CM | POA: Diagnosis not present

## 2012-04-18 DIAGNOSIS — Z860100 Personal history of colon polyps, unspecified: Secondary | ICD-10-CM | POA: Insufficient documentation

## 2012-04-18 DIAGNOSIS — Z87891 Personal history of nicotine dependence: Secondary | ICD-10-CM | POA: Insufficient documentation

## 2012-04-18 DIAGNOSIS — Z96649 Presence of unspecified artificial hip joint: Secondary | ICD-10-CM | POA: Insufficient documentation

## 2012-04-18 DIAGNOSIS — Z951 Presence of aortocoronary bypass graft: Secondary | ICD-10-CM | POA: Diagnosis not present

## 2012-04-18 DIAGNOSIS — I10 Essential (primary) hypertension: Secondary | ICD-10-CM | POA: Diagnosis not present

## 2012-04-18 DIAGNOSIS — Z79899 Other long term (current) drug therapy: Secondary | ICD-10-CM | POA: Diagnosis not present

## 2012-04-18 DIAGNOSIS — R42 Dizziness and giddiness: Secondary | ICD-10-CM | POA: Insufficient documentation

## 2012-04-18 DIAGNOSIS — Z7982 Long term (current) use of aspirin: Secondary | ICD-10-CM | POA: Diagnosis not present

## 2012-04-18 DIAGNOSIS — R55 Syncope and collapse: Secondary | ICD-10-CM | POA: Diagnosis not present

## 2012-04-18 DIAGNOSIS — I517 Cardiomegaly: Secondary | ICD-10-CM | POA: Diagnosis not present

## 2012-04-18 DIAGNOSIS — R404 Transient alteration of awareness: Secondary | ICD-10-CM | POA: Diagnosis not present

## 2012-04-18 DIAGNOSIS — Z8739 Personal history of other diseases of the musculoskeletal system and connective tissue: Secondary | ICD-10-CM | POA: Diagnosis not present

## 2012-04-18 DIAGNOSIS — Z8719 Personal history of other diseases of the digestive system: Secondary | ICD-10-CM | POA: Insufficient documentation

## 2012-04-18 DIAGNOSIS — Z954 Presence of other heart-valve replacement: Secondary | ICD-10-CM | POA: Diagnosis not present

## 2012-04-18 DIAGNOSIS — E785 Hyperlipidemia, unspecified: Secondary | ICD-10-CM | POA: Insufficient documentation

## 2012-04-18 DIAGNOSIS — Z8679 Personal history of other diseases of the circulatory system: Secondary | ICD-10-CM | POA: Insufficient documentation

## 2012-04-18 LAB — BASIC METABOLIC PANEL
BUN: 41 mg/dL — ABNORMAL HIGH (ref 6–23)
CO2: 28 mEq/L (ref 19–32)
Calcium: 9.2 mg/dL (ref 8.4–10.5)
Chloride: 102 mEq/L (ref 96–112)
Creatinine, Ser: 1.31 mg/dL (ref 0.50–1.35)
GFR calc Af Amer: 62 mL/min — ABNORMAL LOW (ref >90)
GFR calc non Af Amer: 54 mL/min — ABNORMAL LOW (ref >90)
Glucose, Bld: 111 mg/dL — ABNORMAL HIGH (ref 70–99)
Potassium: 4.3 mEq/L (ref 3.5–5.1)
Sodium: 140 mEq/L (ref 135–145)

## 2012-04-18 LAB — CBC WITH DIFFERENTIAL/PLATELET
Basophils Absolute: 0 10*3/uL (ref 0.0–0.1)
Basophils Relative: 0 % (ref 0–1)
Eosinophils Absolute: 0.1 10*3/uL (ref 0.0–0.7)
Eosinophils Relative: 1 % (ref 0–5)
HCT: 38.5 % — ABNORMAL LOW (ref 39.0–52.0)
Hemoglobin: 13.2 g/dL (ref 13.0–17.0)
Lymphocytes Relative: 10 % — ABNORMAL LOW (ref 12–46)
Lymphs Abs: 1.5 10*3/uL (ref 0.7–4.0)
MCH: 30.6 pg (ref 26.0–34.0)
MCHC: 34.3 g/dL (ref 30.0–36.0)
MCV: 89.3 fL (ref 78.0–100.0)
Monocytes Absolute: 0.6 10*3/uL (ref 0.1–1.0)
Monocytes Relative: 4 % (ref 3–12)
Neutro Abs: 13 10*3/uL — ABNORMAL HIGH (ref 1.7–7.7)
Neutrophils Relative %: 85 % — ABNORMAL HIGH (ref 43–77)
Platelets: 221 10*3/uL (ref 150–400)
RBC: 4.31 MIL/uL (ref 4.22–5.81)
RDW: 14.1 % (ref 11.5–15.5)
WBC: 15.2 10*3/uL — ABNORMAL HIGH (ref 4.0–10.5)

## 2012-04-18 LAB — URINALYSIS, ROUTINE W REFLEX MICROSCOPIC
Bilirubin Urine: NEGATIVE
Glucose, UA: NEGATIVE mg/dL
Hgb urine dipstick: NEGATIVE
Ketones, ur: NEGATIVE mg/dL
Leukocytes, UA: NEGATIVE
Nitrite: NEGATIVE
Protein, ur: NEGATIVE mg/dL
Specific Gravity, Urine: 1.015 (ref 1.005–1.030)
Urobilinogen, UA: 0.2 mg/dL (ref 0.0–1.0)
pH: 5 (ref 5.0–8.0)

## 2012-04-18 LAB — TROPONIN I: Troponin I: <0.3 ng/mL (ref <0.30)

## 2012-04-18 MED ORDER — SODIUM CHLORIDE 0.9 % IV BOLUS (SEPSIS)
1000.0000 mL | Freq: Once | INTRAVENOUS | Status: AC
  Administered 2012-04-18: 1000 mL via INTRAVENOUS

## 2012-04-18 MED ORDER — IOHEXOL 350 MG/ML SOLN
100.0000 mL | Freq: Once | INTRAVENOUS | Status: AC | PRN
  Administered 2012-04-18: 100 mL via INTRAVENOUS

## 2012-04-18 NOTE — ED Notes (Signed)
MD at bedside. 

## 2012-04-18 NOTE — ED Notes (Signed)
Pt transported to radiology.

## 2012-04-18 NOTE — ED Provider Notes (Signed)
History     CSN: [id number]  Arrival date & time 04/18/12  1231   First MD Initiated Contact with Patient 04/18/12 1234      Chief Complaint  Patient presents with  . Loss of Consciousness    (Consider location/radiation/quality/duration/timing/severity/associated sxs/prior treatment) HPI Patient presents to the emergency department following a syncopal event at his orthopedic doctor's office.  Patient was sitting on the bed when he states he passed out  X2.  Patient states that he is very tired after walking to the office with his crutches.  Patient, states morning, felt a little lightheaded, as well.  Patient denies chest pain, shortness breath, nausea, vomiting, diarrhea, abdominal pain, headache, blurred vision back pain, or fever.  Past Medical History  Diagnosis Date  . Hyperlipidemia   . Hypertension   . Hx of colonic polyps   . CAD (coronary artery disease)   . Aortic stenosis   . Aortic valve disease   . Blood transfusion   . Arthritis   . Complication of anesthesia     hallucinated once  . H/O hiatal hernia     Past Surgical History  Procedure Laterality Date  . Aortic valve replacement    . Total hip replacement  2000    left  . Total knee arthroplasty  2000    left  . Cholecystectomy  1982  . Tonsillectomy    . Cardiac catheterization    . Correction hammer toe  2011    right  . Colonoscopy    . Gastrocnemius recession  05/28/2011  . Weil osteotomy  05/28/2011  . Cardiac valve replacement  2009    aortic  . Total knee arthroplasty  12/01/2011    Procedure: TOTAL KNEE ARTHROPLASTY;  Surgeon: Shelda Pal, MD;  Location: [facility] ORS;  Service: Orthopedics;  Laterality: Right;    Family History  Problem Relation Age of Onset  . Hypertension Other   . Hyperlipidemia Other     History  Substance Use Topics  . Smoking status: Former Smoker    Quit date: 05/25/1973  . Smokeless tobacco: Never Used  . Alcohol Use: Yes     Comment: rare      Review  of Systems All other systems negative except as documented in the HPI. All pertinent positives and negatives as reviewed in the HPI.  Allergies  Review of patient's allergies indicates no known allergies.  Home Medications   Current Outpatient Rx  Name  Route  Sig  Dispense  Refill  . aspirin 325 MG tablet   Oral   Take 1 tablet (325 mg total) by mouth 2 (two) times daily. Will stop 11/26/11           Start the day after finishing the Xarelto.   Marland Kitchen atorvastatin (LIPITOR) 80 MG tablet   Oral   Take 80 mg by mouth at bedtime.         . carvedilol (COREG) 6.25 MG tablet   Oral   Take 6.25 mg by mouth 2 (two) times daily. HOLD until further notice   11/25/11-  States taking BID         . furosemide (LASIX) 40 MG tablet   Oral   Take 1 tablet (40 mg total) by mouth daily.   30 tablet      . HYDROcodone-acetaminophen (NORCO) 7.5-325 MG per tablet   Oral   Take 1-2 tablets by mouth every 4 (four) hours as needed for pain.   120 tablet  0   . Multiple Vitamin (MULTIVITAMIN WITH MINERALS) TABS   Oral   Take 1 tablet by mouth daily.         Marland Kitchen olmesartan-hydrochlorothiazide (BENICAR HCT) 40-12.5 MG per tablet   Oral   Take 0.5 tablets by mouth daily before breakfast. HOLD until further notice   11/25/11   States  Is taking this as written           BP 107/61  Pulse 66  Temp(Src) 98 F (36.7 C) (Oral)  Resp 18  SpO2 100%  Physical Exam  Nursing note and vitals reviewed. Constitutional: He is oriented to person, place, and time. He appears well-developed and well-nourished. No distress.  HENT:  Head: Normocephalic and atraumatic.  Mouth/Throat: Oropharynx is clear and moist. No oropharyngeal exudate.  Eyes: Pupils are equal, round, and reactive to light.  Neck: Normal range of motion. Neck supple.  Cardiovascular: Normal rate, regular rhythm and normal heart sounds.   Pulmonary/Chest: Effort normal and breath sounds normal.  Neurological: He is alert and  oriented to person, place, and time.  Skin: Skin is warm and dry.    ED Course  Procedures (including critical care time)  Labs Reviewed  CBC WITH DIFFERENTIAL - Abnormal; Notable for the following:    WBC 15.2 (*)    HCT 38.5 (*)    Neutrophils Relative 85 (*)    Neutro Abs 13.0 (*)    Lymphocytes Relative 10 (*)    All other components within normal limits  BASIC METABOLIC PANEL - Abnormal; Notable for the following:    Glucose, Bld 111 (*)    BUN 41 (*)    GFR calc non Af Amer 54 (*)    GFR calc Af Amer 62 (*)    All other components within normal limits  TROPONIN I  URINALYSIS, ROUTINE W REFLEX MICROSCOPIC   The patient will be evaluated for PE and other causes of syncope. The patient had syncope in the past at this doctors office.    MDM  MDM Reviewed: vitals and nursing note Interpretation: labs, ECG and CT scan    Date: 04/18/2012  Rate: 70  Rhythm: normal sinus rhythm  QRS Axis: normal  Intervals: normal  ST/T Wave abnormalities: normal  Conduction Disutrbances:right bundle branch block  Narrative Interpretation:   Old EKG Reviewed: unchanged           Carlyle Dolly, PA-C 04/18/12 1611

## 2012-04-18 NOTE — ED Provider Notes (Signed)
Patient evaluated for syncopal episode after exerting himself; states it has been difficult for him to walk with his crutches, making him fatigued. Labs significant for elevated BUN; fecal occult blood test done by Dr. Bernell List which was negative in light of hx of colonic polyps. CT shows no evidence of pulmonary embolus and patient is well appearing, nontoxic, and in NAD. Patient will be d/c with cardiology and PCP follow up regarding today's visit. Patient seen by Dr. Effie Shy with whom the patient care and d/c plan was discussed; Dr. Effie Shy is in agreement.   Date: 04/18/2012  Rate: 83  Rhythm: normal sinus rhythm  QRS Axis: normal  Intervals: normal  ST/T Wave abnormalities: normal  Conduction Disutrbances:right bundle branch block  Narrative Interpretation: Sinus rhythm with ventricular bigeminy and RBBB; No STEMI or T wave inversion  Old EKG Reviewed: none available    Luis Madura, PA-C 04/19/12 [phone]

## 2012-04-18 NOTE — ED Notes (Signed)
Pt 2 week post op foot sx RLE. Today went for recheck on foot, began feeling lightheaded at home. Struggled walking from waiting to exam room. Had a syncopal episode on the exam table x2. BP 60/30 initally. Pt c/o increasing fatigue while ambulating with crutches. 18g LAC, CBG 123.

## 2012-04-18 NOTE — ED Provider Notes (Signed)
Luis Herrera is a 72 y.o. male who is here for evaluation of syncope, after exertion. He's been relatively inactive recently. He admits to difficulty walking with crutches. It "tires me out".   Evaluation: Alert, cooperative. Serial vital signs are reviewed. No orthostatic hypo- tension  Right leg has intact short leg cast, that was applied today  Rectal examination performed since his BUN is elevated, and he has a history of colonic polyps.  Hemoccult is negative.  Assessment - Syncope, without clear cause. He is stable for discharge with outpatient evaluation and treatment by his cardiologist, and primary care Dr. He is advised to see his cardiologist in one week.   Medical screening examination/treatment/procedure(s) were conducted as a shared visit with non-physician practitioner(s) and myself.  I personally evaluated the patient during the encounter  Flint Melter, MD 04/19/12 [id number]

## 2012-04-19 NOTE — ED Provider Notes (Signed)
Medical screening examination/treatment/procedure(s) were conducted as a shared visit with non-physician practitioner(s) and myself.  I personally evaluated the patient during the encounter  Flint Melter, MD 04/19/12 [phone]

## 2012-04-20 DIAGNOSIS — I4510 Unspecified right bundle-branch block: Secondary | ICD-10-CM | POA: Diagnosis not present

## 2012-04-20 DIAGNOSIS — I10 Essential (primary) hypertension: Secondary | ICD-10-CM | POA: Diagnosis not present

## 2012-04-21 NOTE — ED Provider Notes (Signed)
Medical screening examination/treatment/procedure(s) were conducted as a shared visit with non-physician practitioner(s) and myself.  I personally evaluated the patient during the encounter  Luis Herrera is a 72 y.o. male hx of CAD, recent ankle surgery here with syncope at orthopedic office. Was hypotensive in the office. EKG unremarkable, not hypotensive in ED. Given recent surgery, concerned for possible PE. CT angio chest ordered. The PA signed out to Dr. Effie Shy to f/u CT angio chest. Otherwise, I think its unlikely to be ACS.    Richardean Canal, MD 04/21/12 [phone]

## 2012-04-22 ENCOUNTER — Other Ambulatory Visit: Payer: Self-pay | Admitting: Internal Medicine

## 2012-05-16 DIAGNOSIS — Z5189 Encounter for other specified aftercare: Secondary | ICD-10-CM | POA: Diagnosis not present

## 2012-06-13 DIAGNOSIS — Z5189 Encounter for other specified aftercare: Secondary | ICD-10-CM | POA: Diagnosis not present

## 2012-06-23 ENCOUNTER — Telehealth: Payer: Self-pay | Admitting: Internal Medicine

## 2012-06-23 DIAGNOSIS — Z86010 Personal history of colonic polyps: Secondary | ICD-10-CM

## 2012-06-23 NOTE — Telephone Encounter (Signed)
Pt req referral for colonoscopy. Please advise. Last ov was 11/2011. Pt stated that he is due for one.

## 2012-06-23 NOTE — Telephone Encounter (Signed)
done

## 2012-06-28 ENCOUNTER — Encounter: Payer: Self-pay | Admitting: Internal Medicine

## 2012-06-30 ENCOUNTER — Ambulatory Visit (AMBULATORY_SURGERY_CENTER): Payer: Medicare Other | Admitting: *Deleted

## 2012-06-30 VITALS — Ht 71.0 in | Wt 219.0 lb

## 2012-06-30 DIAGNOSIS — Z86010 Personal history of colonic polyps: Secondary | ICD-10-CM

## 2012-06-30 DIAGNOSIS — Z1211 Encounter for screening for malignant neoplasm of colon: Secondary | ICD-10-CM

## 2012-06-30 MED ORDER — MOVIPREP 100 G PO SOLR: 1.0000 | Freq: Once | ORAL | Status: DC

## 2012-06-30 NOTE — Progress Notes (Signed)
NO EGG OR SOY ALLERGY. EWM PT STATES HAD HALLUCINATIONS WITH SEDATION WITH 1 SURGERY BUT NO OTHER PROBLEMS WITH SEDATION IN THE PAST. EWM PT ATTEMPTING TO GET RECORDS FAXED TO [place] FROM [place]. EWM NO HOME 02 USE. EWM

## 2012-07-13 DIAGNOSIS — M2020 Hallux rigidus, unspecified foot: Secondary | ICD-10-CM | POA: Diagnosis not present

## 2012-07-14 ENCOUNTER — Telehealth: Payer: Self-pay | Admitting: Internal Medicine

## 2012-07-14 NOTE — Telephone Encounter (Signed)
Rec'd from Dr.John Dalelia forward 11 pages to Dr.Jones

## 2012-07-19 ENCOUNTER — Ambulatory Visit (AMBULATORY_SURGERY_CENTER): Payer: Medicare Other | Admitting: Internal Medicine

## 2012-07-19 ENCOUNTER — Encounter: Payer: Self-pay | Admitting: Internal Medicine

## 2012-07-19 VITALS — BP 141/60 | HR 66 | Temp 98.1°F | Resp 17 | Ht 71.0 in | Wt 219.0 lb

## 2012-07-19 DIAGNOSIS — F959 Tic disorder, unspecified: Secondary | ICD-10-CM | POA: Diagnosis not present

## 2012-07-19 DIAGNOSIS — I10 Essential (primary) hypertension: Secondary | ICD-10-CM | POA: Diagnosis not present

## 2012-07-19 DIAGNOSIS — D126 Benign neoplasm of colon, unspecified: Secondary | ICD-10-CM | POA: Diagnosis not present

## 2012-07-19 DIAGNOSIS — Z860100 Personal history of colon polyps, unspecified: Secondary | ICD-10-CM

## 2012-07-19 DIAGNOSIS — Z1211 Encounter for screening for malignant neoplasm of colon: Secondary | ICD-10-CM | POA: Diagnosis not present

## 2012-07-19 DIAGNOSIS — D571 Sickle-cell disease without crisis: Secondary | ICD-10-CM | POA: Diagnosis not present

## 2012-07-19 MED ORDER — SODIUM CHLORIDE 0.9 % IV SOLN: 500.0000 mL | INTRAVENOUS | Status: DC

## 2012-07-19 NOTE — Progress Notes (Signed)
Patient did not experience any of the following events: a burn prior to discharge; a fall within the facility; wrong site/side/patient/procedure/implant event; or a hospital transfer or hospital admission upon discharge from the facility. (G8907) Patient did not have preoperative order for IV antibiotic SSI prophylaxis. (G8918)  

## 2012-07-19 NOTE — Op Note (Signed)
 [facility] [address] [place], [address]   COLONOSCOPY PROCEDURE REPORT  PATIENT: Luis, Herrera  MR#: [id number] BIRTHDATE: 03-Apr-1940 , 72  yrs. old GENDER: Male ENDOSCOPIST: Beverley Fiedler, MD REFERRED XB:[id number] Karsten Ro, M.D. PROCEDURE DATE:  07/19/2012 PROCEDURE:   Colonoscopy with cold biopsy polypectomy ASA CLASS:   Class III INDICATIONS:elevated risk screening, Patient's personal history of adenomatous colon polyps, and Last colonoscopy performed 5 years ago. MEDICATIONS: MAC sedation, administered by CRNA and propofol (Diprivan) 250mg  IV  DESCRIPTION OF PROCEDURE:   After the risks benefits and alternatives of the procedure were thoroughly explained, informed consent was obtained.  A digital rectal exam revealed no rectal mass.   The LB [id number]  endoscope was introduced through the anus and advanced to the cecum, which was identified by both the appendix and ileocecal valve. No adverse events experienced. The quality of the prep was Moviprep fair  The instrument was then slowly withdrawn as the colon was fully examined.   COLON FINDINGS: A sessile polyp measuring 4 mm in size was found in the ascending colon.  A polypectomy was performed with cold forceps.  The resection was complete and the polyp tissue was completely retrieved.   There was moderate diverticulosis noted in the descending colon and sigmoid colon with associated muscular hypertrophy and petechiae.  Retroflexed views revealed internal hemorrhoids. The time to cecum=2 minutes 02 seconds.  Withdrawal time=10 minutes 25 seconds.  The scope was withdrawn and the procedure completed. COMPLICATIONS: There were no complications.  ENDOSCOPIC IMPRESSION: 1.   Sessile polyp measuring 4 mm in size was found in the ascending colon; polypectomy was performed with cold forceps 2.   There was moderate diverticulosis noted in the descending colon and sigmoid colon 3.   Small internal  hemorrhoids  RECOMMENDATIONS: 1.  Await pathology results 2.  High fiber diet 3.  Repeat Colonoscopy in 5 years. 4.  You will receive a letter within 1-2 weeks with the results of your biopsy as well as final recommendations.  Please call my office if you have not received a letter after 3 weeks.   eSigned:  Beverley Fiedler, MD 07/19/2012 2:37 PM  cc: The Patient

## 2012-07-19 NOTE — Patient Instructions (Addendum)

## 2012-07-19 NOTE — Progress Notes (Signed)
Called to room to assist during endoscopic procedure.  Patient ID and intended procedure confirmed with present staff. Received instructions for my participation in the procedure from the performing physician.  

## 2012-07-20 ENCOUNTER — Telehealth: Payer: Self-pay | Admitting: *Deleted

## 2012-07-20 NOTE — Telephone Encounter (Signed)
  Follow up Call-  Call back number 07/19/2012  Post procedure Call Back phone  # [phone]  Permission to leave phone message Yes     Patient questions:  Do you have a fever, pain , or abdominal swelling? no Pain Score  0 *  Have you tolerated food without any problems? yes  Have you been able to return to your normal activities? yes  Do you have any questions about your discharge instructions: Diet   no Medications  no Follow up visit  no  Do you have questions or concerns about your Care? no  Actions: * If pain score is 4 or above: No action needed, pain <4.

## 2012-07-26 ENCOUNTER — Encounter: Payer: Self-pay | Admitting: Internal Medicine

## 2012-11-17 DIAGNOSIS — H2510 Age-related nuclear cataract, unspecified eye: Secondary | ICD-10-CM | POA: Diagnosis not present

## 2012-12-27 ENCOUNTER — Ambulatory Visit (INDEPENDENT_AMBULATORY_CARE_PROVIDER_SITE_OTHER): Payer: Medicare Other

## 2012-12-27 DIAGNOSIS — Z23 Encounter for immunization: Secondary | ICD-10-CM

## 2012-12-30 DIAGNOSIS — Z954 Presence of other heart-valve replacement: Secondary | ICD-10-CM | POA: Diagnosis not present

## 2012-12-30 DIAGNOSIS — E7800 Pure hypercholesterolemia, unspecified: Secondary | ICD-10-CM | POA: Diagnosis not present

## 2012-12-30 DIAGNOSIS — I10 Essential (primary) hypertension: Secondary | ICD-10-CM | POA: Diagnosis not present

## 2012-12-30 DIAGNOSIS — Z23 Encounter for immunization: Secondary | ICD-10-CM | POA: Diagnosis not present

## 2012-12-30 DIAGNOSIS — I359 Nonrheumatic aortic valve disorder, unspecified: Secondary | ICD-10-CM | POA: Diagnosis not present

## 2013-05-19 DIAGNOSIS — I10 Essential (primary) hypertension: Secondary | ICD-10-CM | POA: Diagnosis not present

## 2013-05-25 ENCOUNTER — Other Ambulatory Visit: Payer: Self-pay | Admitting: Internal Medicine

## 2013-06-22 ENCOUNTER — Encounter: Payer: Self-pay | Admitting: Internal Medicine

## 2013-06-22 ENCOUNTER — Ambulatory Visit (INDEPENDENT_AMBULATORY_CARE_PROVIDER_SITE_OTHER): Payer: Medicare Other | Admitting: Internal Medicine

## 2013-06-22 VITALS — BP 130/72 | HR 60 | Ht 71.0 in | Wt 217.8 lb

## 2013-06-22 DIAGNOSIS — Z954 Presence of other heart-valve replacement: Secondary | ICD-10-CM

## 2013-06-22 DIAGNOSIS — E785 Hyperlipidemia, unspecified: Secondary | ICD-10-CM

## 2013-06-22 DIAGNOSIS — I10 Essential (primary) hypertension: Secondary | ICD-10-CM | POA: Diagnosis not present

## 2013-06-22 DIAGNOSIS — Z952 Presence of prosthetic heart valve: Secondary | ICD-10-CM

## 2013-06-22 DIAGNOSIS — R609 Edema, unspecified: Secondary | ICD-10-CM

## 2013-06-22 DIAGNOSIS — R0602 Shortness of breath: Secondary | ICD-10-CM | POA: Diagnosis not present

## 2013-06-22 MED ORDER — FUROSEMIDE 40 MG PO TABS: 40.0000 mg | ORAL_TABLET | Freq: Every day | ORAL | Status: DC

## 2013-06-22 MED ORDER — OLMESARTAN MEDOXOMIL 20 MG PO TABS: 20.0000 mg | ORAL_TABLET | Freq: Every day | ORAL | Status: DC

## 2013-06-22 MED ORDER — CARVEDILOL 6.25 MG PO TABS: 6.2500 mg | ORAL_TABLET | Freq: Two times a day (BID) | ORAL | Status: DC

## 2013-06-22 MED ORDER — ATORVASTATIN CALCIUM 80 MG PO TABS: ORAL_TABLET | ORAL | Status: DC

## 2013-06-22 NOTE — Progress Notes (Signed)
HPI Patient is a 73 yo who was previously followed by Luis Herrera at [facility].  Hx of AV disease (s/p AVR)  Also history of CAD, HTN.  Luis Herrera is leaving [facility] and he was referred here for continued care.  He was last seen in April.  Still has occasional dizzines (2-3x per month).  SOme spells with standing  One while driving, looking into sun.  He denies spells with turning head certain ways  Notes no associated palpitations  At last visit his Benicar HCT was switched to benicar.  She recomm cutting back on lasx but he was concerned about edema.  (chronic)   No Known Allergies  Current Outpatient Prescriptions  Medication Sig Dispense Refill  . aspirin 325 MG tablet Take 1 tablet (325 mg total) by mouth 2 (two) times daily. Will stop 11/26/11      . atorvastatin (LIPITOR) 80 MG tablet TAKE 1 TABLET (80 MG TOTAL) BY MOUTH DAILY.  90 tablet  3  . carvedilol (COREG) 6.25 MG tablet Take 6.25 mg by mouth 2 (two) times daily. HOLD until further notice   11/25/11-  States taking BID      . furosemide (LASIX) 40 MG tablet Take 1 tablet (40 mg total) by mouth daily.  30 tablet    . Multiple Vitamin (MULTIVITAMIN WITH MINERALS) TABS Take 1 tablet by mouth daily.      Marland Kitchen olmesartan (BENICAR) 20 MG tablet Take 20 mg by mouth.       No current facility-administered medications for this visit.    Past Medical History  Diagnosis Date  . Hyperlipidemia   . Hypertension   . Hx of colonic polyps   . CAD (coronary artery disease)   . Aortic stenosis   . Aortic valve disease   . Blood transfusion   . Arthritis   . Complication of anesthesia     hallucinated once  . H/O hiatal hernia   . Blood transfusion without reported diagnosis   . Heart murmur     AFTER REPLACED VALVE WENT AWAY    Past Surgical History  Procedure Laterality Date  . Aortic valve replacement    . Total hip replacement  2000    left  . Total knee arthroplasty  2000    left  . Cholecystectomy  1982  . Tonsillectomy    .  Cardiac catheterization    . Correction hammer toe  2011    BILATERAL  . Colonoscopy    . Gastrocnemius recession  05/28/2011  . Weil osteotomy  05/28/2011  . Cardiac valve replacement  2009    aortic  . Total knee arthroplasty  12/01/2011    Procedure: TOTAL KNEE ARTHROPLASTY;  Surgeon: Mauri Pole, MD;  Location: [facility];  Service: Orthopedics;  Laterality: Right;  . Polypectomy      Family History  Problem Relation Age of Onset  . Hypertension Other   . Hyperlipidemia Other   . Colon cancer Mother     History   Social History  . Marital Status: Married    Spouse Name: N/A    Number of Children: N/A  . Years of Education: N/A   Occupational History  . retired    Social History Main Topics  . Smoking status: Former Smoker    Quit date: 05/25/1973  . Smokeless tobacco: Never Used  . Alcohol Use: Yes     Comment: rare  . Drug Use: No  . Sexual Activity: Not Currently   Other Topics  Concern  . Not on file   Social History Narrative   Regular exercise-yes.    Review of Systems:  All systems reviewed.  They are negative to the above problem except as previously stated.  Vital Signs: BP 108/60  Pulse 62  Ht 5\' 11"  (1.803 m)  Wt 217 lb 12.8 oz (98.793 kg)  BMI 30.39 kg/m2  Physical Exam Patient is in NAD HEENT:  Normocephalic, atraumatic. EOMI, PERRLA.  Neck: JVP is normal.  No bruits.  Lungs: clear to auscultation. No rales no wheezes.  Heart: Regular rate and rhythm. Normal S1, S2. No S3.  I/VI systolic murmur  Crisp valve sounds. PMI not displaced.  Abdomen:  Supple, nontender. Normal bowel sounds. No masses. No hepatomegaly.  Extremities:   Good distal pulses throughout. 1+ lower extremity edema.  Musculoskeletal :moving all extremities.  Neuro:   alert and oriented x3.  CN II-XII grossly intact.  EKG  SR 62 First degree AVB  PR interval 202 msec   RBBB.   Assessment and Plan:  1.  Dizziness.Not clear what spells represent  No syncopae  Would follow   Take BP esp if having spell.  Consider event monitor.  2.  Edema.  Watch salt, fluid intake   3.  HTn  As above  4.  HL  Check labs.

## 2013-06-22 NOTE — Patient Instructions (Signed)
Your physician recommends that you continue on your current medications as directed. Please refer to the Current Medication list given to you today.  Your physician recommends that you return for lab FASTING BLOOD WORK AT [facility] (LIPID, LIVER PANEL, CBC, BMET, BNP, TSH)  Your physician wants you to follow-up in: [place] DR. Harrington Challenger.   You will receive a reminder letter in the mail two months in advance. If you don't receive a letter, please call our office to schedule the follow-up appointment.

## 2013-06-26 ENCOUNTER — Other Ambulatory Visit (INDEPENDENT_AMBULATORY_CARE_PROVIDER_SITE_OTHER): Payer: Medicare Other

## 2013-06-26 DIAGNOSIS — R609 Edema, unspecified: Secondary | ICD-10-CM

## 2013-06-26 DIAGNOSIS — Z954 Presence of other heart-valve replacement: Secondary | ICD-10-CM

## 2013-06-26 DIAGNOSIS — I10 Essential (primary) hypertension: Secondary | ICD-10-CM | POA: Diagnosis not present

## 2013-06-26 DIAGNOSIS — Z952 Presence of prosthetic heart valve: Secondary | ICD-10-CM

## 2013-06-26 DIAGNOSIS — R0602 Shortness of breath: Secondary | ICD-10-CM | POA: Diagnosis not present

## 2013-06-26 DIAGNOSIS — E785 Hyperlipidemia, unspecified: Secondary | ICD-10-CM

## 2013-06-26 LAB — LIPID PANEL
CHOL/HDL RATIO: 3
Cholesterol: 142 mg/dL (ref 0–200)
HDL: 50 mg/dL (ref >39.00)
LDL Cholesterol: 76 mg/dL (ref 0–99)
Triglycerides: 79 mg/dL (ref 0.0–149.0)
VLDL: 15.8 mg/dL (ref 0.0–40.0)

## 2013-06-26 LAB — BASIC METABOLIC PANEL
BUN: 22 mg/dL (ref 6–23)
CO2: 30 meq/L (ref 19–32)
CREATININE: 1.1 mg/dL (ref 0.4–1.5)
Calcium: 9.3 mg/dL (ref 8.4–10.5)
Chloride: 101 mEq/L (ref 96–112)
GFR: 72.96 mL/min (ref >60.00)
Glucose, Bld: 105 mg/dL — ABNORMAL HIGH (ref 70–99)
Potassium: 4 mEq/L (ref 3.5–5.1)
Sodium: 138 mEq/L (ref 135–145)

## 2013-06-26 LAB — CBC
HCT: 40.8 % (ref 39.0–52.0)
HEMOGLOBIN: 13.8 g/dL (ref 13.0–17.0)
MCHC: 33.8 g/dL (ref 30.0–36.0)
MCV: 91.4 fl (ref 78.0–100.0)
PLATELETS: 201 10*3/uL (ref 150.0–400.0)
RBC: 4.46 Mil/uL (ref 4.22–5.81)
RDW: 14.3 % (ref 11.5–15.5)
WBC: 8.1 10*3/uL (ref 4.0–10.5)

## 2013-06-26 LAB — BRAIN NATRIURETIC PEPTIDE: Pro B Natriuretic peptide (BNP): 64 pg/mL (ref 0.0–100.0)

## 2013-06-26 LAB — HEPATIC FUNCTION PANEL
ALBUMIN: 4.2 g/dL (ref 3.5–5.2)
ALT: 23 U/L (ref 0–53)
AST: 27 U/L (ref 0–37)
Alkaline Phosphatase: 78 U/L (ref 39–117)
Bilirubin, Direct: 0.1 mg/dL (ref 0.0–0.3)
TOTAL PROTEIN: 7.8 g/dL (ref 6.0–8.3)
Total Bilirubin: 1.5 mg/dL — ABNORMAL HIGH (ref 0.2–1.2)

## 2013-06-26 LAB — TSH: TSH: 3.34 u[IU]/mL (ref 0.35–4.50)

## 2013-06-27 ENCOUNTER — Encounter: Payer: Self-pay | Admitting: *Deleted

## 2013-06-29 ENCOUNTER — Encounter: Payer: Self-pay | Admitting: Internal Medicine

## 2013-06-29 NOTE — Telephone Encounter (Signed)
New message    Calling for test results  

## 2013-06-29 NOTE — Telephone Encounter (Signed)
This encounter was created in error - please disregard.

## 2013-07-24 ENCOUNTER — Encounter: Payer: Self-pay | Admitting: Internal Medicine

## 2013-07-24 ENCOUNTER — Other Ambulatory Visit: Payer: Medicare Other

## 2013-07-24 ENCOUNTER — Ambulatory Visit (INDEPENDENT_AMBULATORY_CARE_PROVIDER_SITE_OTHER): Payer: Medicare Other | Admitting: Internal Medicine

## 2013-07-24 VITALS — BP 138/72 | HR 76 | Temp 97.3°F | Resp 16 | Ht 71.0 in | Wt 217.4 lb

## 2013-07-24 DIAGNOSIS — L02219 Cutaneous abscess of trunk, unspecified: Secondary | ICD-10-CM | POA: Diagnosis not present

## 2013-07-24 DIAGNOSIS — Z952 Presence of prosthetic heart valve: Secondary | ICD-10-CM

## 2013-07-24 DIAGNOSIS — E785 Hyperlipidemia, unspecified: Secondary | ICD-10-CM

## 2013-07-24 DIAGNOSIS — I10 Essential (primary) hypertension: Secondary | ICD-10-CM | POA: Diagnosis not present

## 2013-07-24 DIAGNOSIS — Z23 Encounter for immunization: Secondary | ICD-10-CM | POA: Diagnosis not present

## 2013-07-24 DIAGNOSIS — L02214 Cutaneous abscess of groin: Secondary | ICD-10-CM

## 2013-07-24 DIAGNOSIS — L03319 Cellulitis of trunk, unspecified: Secondary | ICD-10-CM

## 2013-07-24 MED ORDER — SULFAMETHOXAZOLE-TRIMETHOPRIM 800-160 MG PO TABS: 1.0000 | ORAL_TABLET | Freq: Two times a day (BID) | ORAL | Status: DC

## 2013-07-24 NOTE — Assessment & Plan Note (Signed)
His BP is well controlled 

## 2013-07-24 NOTE — Patient Instructions (Signed)
Abscess An abscess is an infected area that contains a collection of pus and debris.It can occur in almost any part of the body. An abscess is also known as a furuncle or boil. CAUSES  An abscess occurs when tissue gets infected. This can occur from blockage of oil or sweat glands, infection of hair follicles, or a minor injury to the skin. As the body tries to fight the infection, pus collects in the area and creates pressure under the skin. This pressure causes pain. People with weakened immune systems have difficulty fighting infections and get certain abscesses more often.  SYMPTOMS Usually an abscess develops on the skin and becomes a painful mass that is red, warm, and tender. If the abscess forms under the skin, you may feel a moveable soft area under the skin. Some abscesses break open (rupture) on their own, but most will continue to get worse without care. The infection can spread deeper into the body and eventually into the bloodstream, causing you to feel ill.  DIAGNOSIS  Your caregiver will take your medical history and perform a physical exam. A sample of fluid may also be taken from the abscess to determine what is causing your infection. TREATMENT  Your caregiver may prescribe antibiotic medicines to fight the infection. However, taking antibiotics alone usually does not cure an abscess. Your caregiver may need to make a small cut (incision) in the abscess to drain the pus. In some cases, gauze is packed into the abscess to reduce pain and to continue draining the area. HOME CARE INSTRUCTIONS   Only take over-the-counter or prescription medicines for pain, discomfort, or fever as directed by your caregiver.  If you were prescribed antibiotics, take them as directed. Finish them even if you start to feel better.  If gauze is used, follow your caregiver's directions for changing the gauze.  To avoid spreading the infection:  Keep your draining abscess covered with a  bandage.  Wash your hands well.  Do not share personal care items, towels, or whirlpools with others.  Avoid skin contact with others.  Keep your skin and clothes clean around the abscess.  Keep all follow-up appointments as directed by your caregiver. SEEK MEDICAL CARE IF:   You have increased pain, swelling, redness, fluid drainage, or bleeding.  You have muscle aches, chills, or a general ill feeling.  You have a fever. MAKE SURE YOU:   Understand these instructions.  Will watch your condition.  Will get help right away if you are not doing well or get worse. Document Released: 11/12/2004 Document Revised: 08/04/2011 Document Reviewed: 04/17/2011 Exit[employer] Patient Information 2014 [employer].  

## 2013-07-24 NOTE — Progress Notes (Signed)
Pre visit review using our clinic review tool, if applicable. No additional management support is needed unless otherwise documented below in the visit note. 

## 2013-07-24 NOTE — Progress Notes (Signed)
   Subjective:    Patient ID: Luis Herrera, male    DOB: 09/02/1940, 73 y.o.   MRN: [id number]  HPI Comments: He returns complaining of a 10 day history of a painful bump in his right groin area that has been oozing blood.     Review of Systems  Constitutional: Negative.  Negative for fever, chills, diaphoresis, appetite change and fatigue.  HENT: Negative.   Eyes: Negative.   Respiratory: Negative.  Negative for cough, chest tightness, shortness of breath, wheezing and stridor.   Cardiovascular: Negative.  Negative for chest pain, palpitations and leg swelling.  Gastrointestinal: Negative.  Negative for nausea, vomiting, abdominal pain, diarrhea, constipation and blood in stool.  Endocrine: Negative.   Genitourinary: Negative.   Musculoskeletal: Negative.   Skin: Negative.   Allergic/Immunologic: Negative.   Neurological: Negative.   Hematological: Negative.  Negative for adenopathy. Does not bruise/bleed easily.  Psychiatric/Behavioral: Negative.        Objective:   Physical Exam  Vitals reviewed. Constitutional: He is oriented to person, place, and time. He appears well-developed and well-nourished. No distress.  HENT:  Head: Normocephalic and atraumatic.  Mouth/Throat: Oropharynx is clear and moist. No oropharyngeal exudate.  Eyes: Conjunctivae are normal. Right eye exhibits no discharge. Left eye exhibits no discharge. No scleral icterus.  Neck: Normal range of motion. Neck supple. No JVD present. No tracheal deviation present. No thyromegaly present.  Cardiovascular: Normal rate, regular rhythm, normal heart sounds and intact distal pulses.  Exam reveals no gallop and no friction rub.   No murmur heard. Pulmonary/Chest: Effort normal and breath sounds normal. No stridor. No respiratory distress. He has no wheezes. He has no rales. He exhibits no tenderness.  Abdominal: Soft. Bowel sounds are normal. He exhibits no distension and no mass. There is no tenderness. There is no  rebound and no guarding.  Genitourinary:     Musculoskeletal: Normal range of motion. He exhibits no edema and no tenderness.  Lymphadenopathy:    He has no cervical adenopathy.  Neurological: He is oriented to person, place, and time.  Skin: Skin is warm and dry. No rash noted. He is not diaphoretic. No erythema. No pallor.  Psychiatric: He has a normal mood and affect. His behavior is normal. Judgment and thought content normal.     Lab Results  Component Value Date   WBC 8.1 06/26/2013   HGB 13.8 06/26/2013   HCT 40.8 06/26/2013   PLT 201.0 06/26/2013   GLUCOSE 105* 06/26/2013   CHOL 142 06/26/2013   TRIG 79.0 06/26/2013   HDL 50.00 06/26/2013   LDLCALC 76 06/26/2013   ALT 23 06/26/2013   AST 27 06/26/2013   NA 138 06/26/2013   K 4.0 06/26/2013   CL 101 06/26/2013   CREATININE 1.1 06/26/2013   BUN 22 06/26/2013   CO2 30 06/26/2013   TSH 3.34 06/26/2013   PSA 0.98 09/01/2010   INR 0.97 11/25/2011       Assessment & Plan:

## 2013-07-24 NOTE — Assessment & Plan Note (Signed)
I think this is MRSA, culture has been sent Will treat with bactrim-ds

## 2013-07-26 ENCOUNTER — Telehealth: Payer: Self-pay | Admitting: Internal Medicine

## 2013-07-26 LAB — WOUND CULTURE: Gram Stain: NONE SEEN

## 2013-07-26 NOTE — Telephone Encounter (Signed)
It was positive for several bacteria, how is he doing?

## 2013-07-26 NOTE — Telephone Encounter (Signed)
Pt called request the result of the wound culture that was done 07/24/13. Please call pt

## 2013-07-26 NOTE — Telephone Encounter (Signed)
Called patient advised that pt not in right now, will try again later. Pt also has 1wk follow up scheduled for 07/31/13

## 2013-07-31 ENCOUNTER — Ambulatory Visit (INDEPENDENT_AMBULATORY_CARE_PROVIDER_SITE_OTHER): Payer: Medicare Other | Admitting: Internal Medicine

## 2013-07-31 ENCOUNTER — Encounter: Payer: Self-pay | Admitting: Internal Medicine

## 2013-07-31 VITALS — BP 116/62 | HR 81 | Temp 97.5°F | Resp 16 | Ht 71.0 in | Wt 212.2 lb

## 2013-07-31 DIAGNOSIS — L03319 Cellulitis of trunk, unspecified: Secondary | ICD-10-CM

## 2013-07-31 DIAGNOSIS — L02219 Cutaneous abscess of trunk, unspecified: Secondary | ICD-10-CM | POA: Diagnosis not present

## 2013-07-31 DIAGNOSIS — L02214 Cutaneous abscess of groin: Secondary | ICD-10-CM

## 2013-07-31 NOTE — Patient Instructions (Signed)
Cellulitis Cellulitis is an infection of the skin and the tissue beneath it. The infected area is usually red and tender. Cellulitis occurs most often in the arms and lower legs.  CAUSES  Cellulitis is caused by bacteria that enter the skin through cracks or cuts in the skin. The most common types of bacteria that cause cellulitis are Staphylococcus and Streptococcus. SYMPTOMS   Redness and warmth.  Swelling.  Tenderness or pain.  Fever. DIAGNOSIS  Your caregiver can usually determine what is wrong based on a physical exam. Blood tests may also be done. TREATMENT  Treatment usually involves taking an antibiotic medicine. HOME CARE INSTRUCTIONS   Take your antibiotics as directed. Finish them even if you start to feel better.  Keep the infected arm or leg elevated to reduce swelling.  Apply a warm cloth to the affected area up to 4 times per day to relieve pain.  Only take over-the-counter or prescription medicines for pain, discomfort, or fever as directed by your caregiver.  Keep all follow-up appointments as directed by your caregiver. SEEK MEDICAL CARE IF:   You notice red streaks coming from the infected area.  Your red area gets larger or turns dark in color.  Your bone or joint underneath the infected area becomes painful after the skin has healed.  Your infection returns in the same area or another area.  You notice a swollen bump in the infected area.  You develop new symptoms. SEEK IMMEDIATE MEDICAL CARE IF:   You have a fever.  You feel very sleepy.  You develop vomiting or diarrhea.  You have a general ill feeling (malaise) with muscle aches and pains. MAKE SURE YOU:   Understand these instructions.  Will watch your condition.  Will get help right away if you are not doing well or get worse. Document Released: 11/12/2004 Document Revised: 08/04/2011 Document Reviewed: 04/20/2011 Exit[employer] Patient Information 2014 [employer].  

## 2013-07-31 NOTE — Progress Notes (Signed)
Pre visit review using our clinic review tool, if applicable. No additional management support is needed unless otherwise documented below in the visit note. 

## 2013-08-01 ENCOUNTER — Encounter: Payer: Self-pay | Admitting: Internal Medicine

## 2013-08-01 NOTE — Assessment & Plan Note (Signed)
This has resolved.

## 2013-08-01 NOTE — Progress Notes (Signed)
   Subjective:    Patient ID: Luis Herrera, male    DOB: 03-Apr-1940, 73 y.o.   MRN: [id number]  Wound Check He was originally treated 5 to 10 days ago. Previous treatment included oral antibiotics. His temperature was unmeasured prior to arrival. There has been no drainage from the wound. There is no redness present. There is no swelling present. The pain has no pain. He has no difficulty moving the affected extremity or digit.      Review of Systems  Constitutional: Negative.  Negative for fever, chills, diaphoresis, appetite change and fatigue.  HENT: Negative.   Eyes: Negative.   Respiratory: Negative.  Negative for cough, choking, chest tightness and shortness of breath.   Cardiovascular: Negative.  Negative for chest pain, palpitations and leg swelling.  Gastrointestinal: Negative.  Negative for nausea, vomiting, abdominal pain, diarrhea, constipation and blood in stool.  Endocrine: Negative.   Genitourinary: Negative.   Musculoskeletal: Negative.   Skin: Negative.  Negative for color change and rash.  Allergic/Immunologic: Negative.   Neurological: Negative.  Negative for dizziness, tremors, speech difficulty, weakness, light-headedness and numbness.  Hematological: Negative.  Negative for adenopathy. Does not bruise/bleed easily.  Psychiatric/Behavioral: Negative.        Objective:   Physical Exam  Vitals reviewed. Constitutional: He is oriented to person, place, and time. He appears well-developed and well-nourished.  Non-toxic appearance. He does not have a sickly appearance. He does not appear ill. No distress.  HENT:  Head: Normocephalic and atraumatic.  Mouth/Throat: Oropharynx is clear and moist. No oropharyngeal exudate.  Eyes: Conjunctivae are normal. Right eye exhibits no discharge. Left eye exhibits no discharge. No scleral icterus.  Neck: Normal range of motion. Neck supple. No JVD present. No tracheal deviation present. No thyromegaly present.  Cardiovascular:  Normal rate, regular rhythm, normal heart sounds and intact distal pulses.  Exam reveals no gallop and no friction rub.   No murmur heard. Pulmonary/Chest: Effort normal and breath sounds normal. No stridor. No respiratory distress. He has no wheezes. He has no rales. He exhibits no tenderness.  Abdominal: Soft. Bowel sounds are normal. He exhibits no distension and no mass. There is no tenderness. There is no rebound and no guarding.  Genitourinary:     Musculoskeletal: Normal range of motion. He exhibits no edema and no tenderness.  Lymphadenopathy:    He has no cervical adenopathy.  Neurological: He is oriented to person, place, and time.  Skin: Skin is warm and dry. No rash noted. He is not diaphoretic. No erythema. No pallor.  Psychiatric: He has a normal mood and affect. His behavior is normal. Judgment and thought content normal.          Assessment & Plan:

## 2013-08-02 ENCOUNTER — Encounter: Payer: Self-pay | Admitting: Family Medicine

## 2013-08-02 ENCOUNTER — Inpatient Hospital Stay (HOSPITAL_COMMUNITY)
Admission: EM | Admit: 2013-08-02 | Discharge: 2013-08-03 | DRG: 607 | Disposition: A | Discharge status: Home / Self Care | Payer: Medicare Other | Attending: Internal Medicine | Admitting: Internal Medicine

## 2013-08-02 ENCOUNTER — Encounter (HOSPITAL_COMMUNITY): Payer: Self-pay | Admitting: Emergency Medicine

## 2013-08-02 ENCOUNTER — Telehealth: Payer: Self-pay | Admitting: *Deleted

## 2013-08-02 ENCOUNTER — Telehealth: Payer: Self-pay

## 2013-08-02 ENCOUNTER — Ambulatory Visit (INDEPENDENT_AMBULATORY_CARE_PROVIDER_SITE_OTHER): Payer: Medicare Other | Admitting: Family Medicine

## 2013-08-02 VITALS — BP 102/62 | HR 90 | Temp 97.9°F | Ht 71.0 in | Wt 211.0 lb

## 2013-08-02 DIAGNOSIS — Z9089 Acquired absence of other organs: Secondary | ICD-10-CM

## 2013-08-02 DIAGNOSIS — N179 Acute kidney failure, unspecified: Secondary | ICD-10-CM | POA: Diagnosis not present

## 2013-08-02 DIAGNOSIS — Z96659 Presence of unspecified artificial knee joint: Secondary | ICD-10-CM

## 2013-08-02 DIAGNOSIS — Z954 Presence of other heart-valve replacement: Secondary | ICD-10-CM | POA: Diagnosis not present

## 2013-08-02 DIAGNOSIS — T887XXA Unspecified adverse effect of drug or medicament, initial encounter: Secondary | ICD-10-CM | POA: Diagnosis not present

## 2013-08-02 DIAGNOSIS — L02219 Cutaneous abscess of trunk, unspecified: Secondary | ICD-10-CM | POA: Diagnosis not present

## 2013-08-02 DIAGNOSIS — Z79899 Other long term (current) drug therapy: Secondary | ICD-10-CM | POA: Diagnosis not present

## 2013-08-02 DIAGNOSIS — L02214 Cutaneous abscess of groin: Secondary | ICD-10-CM

## 2013-08-02 DIAGNOSIS — M545 Low back pain, unspecified: Secondary | ICD-10-CM

## 2013-08-02 DIAGNOSIS — T50905A Adverse effect of unspecified drugs, medicaments and biological substances, initial encounter: Secondary | ICD-10-CM

## 2013-08-02 DIAGNOSIS — M546 Pain in thoracic spine: Secondary | ICD-10-CM

## 2013-08-02 DIAGNOSIS — I2510 Atherosclerotic heart disease of native coronary artery without angina pectoris: Secondary | ICD-10-CM | POA: Diagnosis present

## 2013-08-02 DIAGNOSIS — E785 Hyperlipidemia, unspecified: Secondary | ICD-10-CM | POA: Diagnosis present

## 2013-08-02 DIAGNOSIS — R509 Fever, unspecified: Secondary | ICD-10-CM | POA: Diagnosis not present

## 2013-08-02 DIAGNOSIS — R42 Dizziness and giddiness: Secondary | ICD-10-CM | POA: Diagnosis not present

## 2013-08-02 DIAGNOSIS — Z96649 Presence of unspecified artificial hip joint: Secondary | ICD-10-CM

## 2013-08-02 DIAGNOSIS — Z87891 Personal history of nicotine dependence: Secondary | ICD-10-CM

## 2013-08-02 DIAGNOSIS — IMO0002 Reserved for concepts with insufficient information to code with codable children: Secondary | ICD-10-CM

## 2013-08-02 DIAGNOSIS — Z7982 Long term (current) use of aspirin: Secondary | ICD-10-CM

## 2013-08-02 DIAGNOSIS — R21 Rash and other nonspecific skin eruption: Secondary | ICD-10-CM

## 2013-08-02 DIAGNOSIS — L03319 Cellulitis of trunk, unspecified: Secondary | ICD-10-CM

## 2013-08-02 DIAGNOSIS — Z860100 Personal history of colon polyps, unspecified: Secondary | ICD-10-CM

## 2013-08-02 DIAGNOSIS — L270 Generalized skin eruption due to drugs and medicaments taken internally: Secondary | ICD-10-CM | POA: Diagnosis present

## 2013-08-02 DIAGNOSIS — Z8249 Family history of ischemic heart disease and other diseases of the circulatory system: Secondary | ICD-10-CM

## 2013-08-02 DIAGNOSIS — E663 Overweight: Secondary | ICD-10-CM

## 2013-08-02 DIAGNOSIS — I10 Essential (primary) hypertension: Secondary | ICD-10-CM

## 2013-08-02 DIAGNOSIS — Z800 Family history of malignant neoplasm of digestive organs: Secondary | ICD-10-CM

## 2013-08-02 DIAGNOSIS — M4714 Other spondylosis with myelopathy, thoracic region: Secondary | ICD-10-CM

## 2013-08-02 DIAGNOSIS — Z0000 Encounter for general adult medical examination without abnormal findings: Secondary | ICD-10-CM

## 2013-08-02 DIAGNOSIS — Z952 Presence of prosthetic heart valve: Secondary | ICD-10-CM

## 2013-08-02 DIAGNOSIS — R110 Nausea: Secondary | ICD-10-CM

## 2013-08-02 DIAGNOSIS — T370X5A Adverse effect of sulfonamides, initial encounter: Secondary | ICD-10-CM | POA: Diagnosis present

## 2013-08-02 DIAGNOSIS — Y92009 Unspecified place in unspecified non-institutional (private) residence as the place of occurrence of the external cause: Secondary | ICD-10-CM

## 2013-08-02 LAB — URINALYSIS, ROUTINE W REFLEX MICROSCOPIC
Glucose, UA: NEGATIVE mg/dL
Hgb urine dipstick: NEGATIVE
KETONES UR: NEGATIVE mg/dL
NITRITE: NEGATIVE
PROTEIN: 30 mg/dL — AB
Specific Gravity, Urine: 1.025 (ref 1.005–1.030)
UROBILINOGEN UA: 1 mg/dL (ref 0.0–1.0)
pH: 5 (ref 5.0–8.0)

## 2013-08-02 LAB — CBC WITH DIFFERENTIAL/PLATELET
BASOS ABS: 0 10*3/uL (ref 0.0–0.1)
BASOS PCT: 0 % (ref 0–1)
EOS PCT: 5 % (ref 0–5)
Eosinophils Absolute: 0.4 10*3/uL (ref 0.0–0.7)
HCT: 45 % (ref 39.0–52.0)
Hemoglobin: 14.8 g/dL (ref 13.0–17.0)
LYMPHS PCT: 6 % — AB (ref 12–46)
Lymphs Abs: 0.5 10*3/uL — ABNORMAL LOW (ref 0.7–4.0)
MCH: 30.6 pg (ref 26.0–34.0)
MCHC: 32.9 g/dL (ref 30.0–36.0)
MCV: 93 fL (ref 78.0–100.0)
MONO ABS: 0.7 10*3/uL (ref 0.1–1.0)
Monocytes Relative: 8 % (ref 3–12)
Neutro Abs: 7 10*3/uL (ref 1.7–7.7)
Neutrophils Relative %: 81 % — ABNORMAL HIGH (ref 43–77)
Platelets: 183 10*3/uL (ref 150–400)
RBC: 4.84 MIL/uL (ref 4.22–5.81)
RDW: 13.8 % (ref 11.5–15.5)
WBC: 8.6 10*3/uL (ref 4.0–10.5)

## 2013-08-02 LAB — BASIC METABOLIC PANEL
BUN: 30 mg/dL — ABNORMAL HIGH (ref 6–23)
CALCIUM: 9.3 mg/dL (ref 8.4–10.5)
CO2: 24 mEq/L (ref 19–32)
CREATININE: 2.19 mg/dL — AB (ref 0.50–1.35)
Chloride: 96 mEq/L (ref 96–112)
GFR, EST AFRICAN AMERICAN: 33 mL/min — AB (ref >90)
GFR, EST NON AFRICAN AMERICAN: 28 mL/min — AB (ref >90)
Glucose, Bld: 108 mg/dL — ABNORMAL HIGH (ref 70–99)
Potassium: 5.1 mEq/L (ref 3.7–5.3)
Sodium: 136 mEq/L — ABNORMAL LOW (ref 137–147)

## 2013-08-02 LAB — URINE MICROSCOPIC-ADD ON

## 2013-08-02 MED ORDER — SENNOSIDES-DOCUSATE SODIUM 8.6-50 MG PO TABS: 1.0000 | ORAL_TABLET | Freq: Every evening | ORAL | Status: DC | PRN

## 2013-08-02 MED ORDER — DIPHENHYDRAMINE-ZINC ACETATE 2-0.1 % EX CREA
TOPICAL_CREAM | Freq: Two times a day (BID) | CUTANEOUS | Status: DC | PRN
  Administered 2013-08-02: 18:00:00 via TOPICAL
  Filled 2013-08-02: qty 28

## 2013-08-02 MED ORDER — CARVEDILOL 6.25 MG PO TABS
6.2500 mg | ORAL_TABLET | Freq: Two times a day (BID) | ORAL | Status: DC
  Administered 2013-08-02 – 2013-08-03 (×2): 6.25 mg via ORAL
  Filled 2013-08-02 (×4): qty 1

## 2013-08-02 MED ORDER — ENOXAPARIN SODIUM 40 MG/0.4ML ~~LOC~~ SOLN
40.0000 mg | SUBCUTANEOUS | Status: DC
  Administered 2013-08-02: 40 mg via SUBCUTANEOUS
  Filled 2013-08-02 (×2): qty 0.4

## 2013-08-02 MED ORDER — ONDANSETRON HCL 4 MG PO TABS: 4.0000 mg | ORAL_TABLET | Freq: Four times a day (QID) | ORAL | Status: DC | PRN

## 2013-08-02 MED ORDER — ONDANSETRON HCL 4 MG/2ML IJ SOLN
4.0000 mg | Freq: Once | INTRAMUSCULAR | Status: AC
  Administered 2013-08-02: 4 mg via INTRAVENOUS
  Filled 2013-08-02: qty 2

## 2013-08-02 MED ORDER — DIPHENHYDRAMINE HCL 50 MG/ML IJ SOLN
25.0000 mg | Freq: Once | INTRAMUSCULAR | Status: AC
  Administered 2013-08-02: 25 mg via INTRAVENOUS
  Filled 2013-08-02: qty 1

## 2013-08-02 MED ORDER — SODIUM CHLORIDE 0.9 % IV SOLN
INTRAVENOUS | Status: DC
  Administered 2013-08-02 – 2013-08-03 (×2): via INTRAVENOUS

## 2013-08-02 MED ORDER — ATORVASTATIN CALCIUM 80 MG PO TABS
80.0000 mg | ORAL_TABLET | Freq: Every day | ORAL | Status: DC
  Administered 2013-08-03: 80 mg via ORAL
  Filled 2013-08-02: qty 1

## 2013-08-02 MED ORDER — DIPHENHYDRAMINE HCL 25 MG PO CAPS: 25.0000 mg | ORAL_CAPSULE | Freq: Four times a day (QID) | ORAL | Status: DC | PRN

## 2013-08-02 MED ORDER — ADULT MULTIVITAMIN W/MINERALS CH
1.0000 | ORAL_TABLET | Freq: Every day | ORAL | Status: DC
  Administered 2013-08-02 – 2013-08-03 (×2): 1 via ORAL
  Filled 2013-08-02 (×2): qty 1

## 2013-08-02 MED ORDER — FAMOTIDINE 20 MG PO TABS
20.0000 mg | ORAL_TABLET | Freq: Once | ORAL | Status: AC
  Administered 2013-08-02: 20 mg via ORAL
  Filled 2013-08-02: qty 1

## 2013-08-02 MED ORDER — OXYCODONE HCL 5 MG PO TABS: 5.0000 mg | ORAL_TABLET | ORAL | Status: DC | PRN

## 2013-08-02 MED ORDER — SODIUM CHLORIDE 0.9 % IV BOLUS (SEPSIS)
1000.0000 mL | Freq: Once | INTRAVENOUS | Status: AC
  Administered 2013-08-02: 1000 mL via INTRAVENOUS

## 2013-08-02 MED ORDER — FAMOTIDINE 40 MG PO TABS
40.0000 mg | ORAL_TABLET | Freq: Every day | ORAL | Status: DC
  Administered 2013-08-02 – 2013-08-03 (×2): 40 mg via ORAL
  Filled 2013-08-02 (×2): qty 1

## 2013-08-02 MED ORDER — BIOTENE DRY MOUTH MT LIQD
15.0000 mL | Freq: Two times a day (BID) | OROMUCOSAL | Status: DC
Start: 2013-08-02 — End: 2013-08-02

## 2013-08-02 MED ORDER — ONDANSETRON HCL 4 MG/2ML IJ SOLN: 4.0000 mg | Freq: Four times a day (QID) | INTRAMUSCULAR | Status: DC | PRN

## 2013-08-02 MED ORDER — ACETAMINOPHEN 650 MG RE SUPP: 650.0000 mg | Freq: Four times a day (QID) | RECTAL | Status: DC | PRN

## 2013-08-02 MED ORDER — PREDNISONE 20 MG PO TABS
40.0000 mg | ORAL_TABLET | Freq: Every day | ORAL | Status: DC
  Administered 2013-08-03: 40 mg via ORAL
  Filled 2013-08-02 (×2): qty 2

## 2013-08-02 MED ORDER — ASPIRIN 325 MG PO TABS
325.0000 mg | ORAL_TABLET | Freq: Two times a day (BID) | ORAL | Status: DC
  Administered 2013-08-02 – 2013-08-03 (×2): 325 mg via ORAL
  Filled 2013-08-02 (×3): qty 1

## 2013-08-02 MED ORDER — PREDNISONE 20 MG PO TABS
60.0000 mg | ORAL_TABLET | Freq: Once | ORAL | Status: AC
  Administered 2013-08-02: 60 mg via ORAL
  Filled 2013-08-02: qty 3

## 2013-08-02 MED ORDER — ACETAMINOPHEN 325 MG PO TABS: 650.0000 mg | ORAL_TABLET | Freq: Four times a day (QID) | ORAL | Status: DC | PRN

## 2013-08-02 NOTE — Progress Notes (Signed)
No chief complaint on file.   HPI:  Acute visit for:  1) Rash: -saw PCP for skin infection in groin recently and treated with bactrim - started 8 days ago, followed up 2 days ago with PCP and was having upset stomach -rash started last night - red rash all over body and face, fever yesterday 101, nausea, lighthheaded today, chills today, upset stomach -denies: CP, SOB, nausea, vomiting, throat or facial swelling, skin tenderness, blisters, oral lesions -called CAN - took tylenol  ROS: See pertinent positives and negatives per HPI.  Past Medical History  Diagnosis Date  . Hyperlipidemia   . Hypertension   . Hx of colonic polyps   . CAD (coronary artery disease)   . Aortic stenosis   . Aortic valve disease   . Blood transfusion   . Arthritis   . Complication of anesthesia     hallucinated once  . H/O hiatal hernia   . Blood transfusion without reported diagnosis   . Heart murmur     AFTER REPLACED VALVE WENT AWAY    Past Surgical History  Procedure Laterality Date  . Aortic valve replacement    . Total hip replacement  2000    left  . Total knee arthroplasty  2000    left  . Cholecystectomy  1982  . Tonsillectomy    . Cardiac catheterization    . Correction hammer toe  2011    BILATERAL  . Colonoscopy    . Gastrocnemius recession  05/28/2011  . Weil osteotomy  05/28/2011  . Cardiac valve replacement  2009    aortic  . Total knee arthroplasty  12/01/2011    Procedure: TOTAL KNEE ARTHROPLASTY;  Surgeon: Mauri Pole, MD;  Location: [facility];  Service: Orthopedics;  Laterality: Right;  . Polypectomy      Family History  Problem Relation Age of Onset  . Hypertension Other   . Hyperlipidemia Other   . Colon cancer Mother     History   Social History  . Marital Status: Married    Spouse Name: N/A    Number of Children: N/A  . Years of Education: N/A   Occupational History  . retired    Social History Main Topics  . Smoking status: Former Smoker    Quit  date: 05/25/1973  . Smokeless tobacco: Never Used  . Alcohol Use: No     Comment: rare  . Drug Use: No  . Sexual Activity: Not Currently   Other Topics Concern  . None   Social History Narrative   Regular exercise-yes.    Current outpatient prescriptions:aspirin 325 MG tablet, Take 1 tablet (325 mg total) by mouth 2 (two) times daily. Will stop 11/26/11, Disp: , Rfl: ;  atorvastatin (LIPITOR) 80 MG tablet, TAKE 1 TABLET (80 MG TOTAL) BY MOUTH DAILY., Disp: 90 tablet, Rfl: 3;  carvedilol (COREG) 6.25 MG tablet, Take 1 tablet (6.25 mg total) by mouth 2 (two) times daily., Disp: 180 tablet, Rfl: 3 furosemide (LASIX) 40 MG tablet, Take 1 tablet (40 mg total) by mouth daily., Disp: 90 tablet, Rfl: 3;  Multiple Vitamin (MULTIVITAMIN WITH MINERALS) TABS, Take 1 tablet by mouth daily., Disp: , Rfl: ;  olmesartan (BENICAR) 20 MG tablet, Take 1 tablet (20 mg total) by mouth daily., Disp: 90 tablet, Rfl: 3;  sulfamethoxazole-trimethoprim (SEPTRA DS) 800-160 MG per tablet, Take 1 tablet by mouth 2 (two) times daily., Disp: 20 tablet, Rfl: 1  EXAM:  Filed Vitals:   08/02/13 1047  BP: 102/62  Pulse: 90  Temp: 97.9 F (36.6 C)    Body mass index is 29.44 kg/(m^2).  GENERAL: vitals reviewed and listed above, alert, oriented, appears well hydrated and in no acute distress  HEENT: atraumatic, conjunttiva clear, no obvious abnormalities on inspection of external nose and ears  NECK: no obvious masses on inspection  LUNGS: clear to auscultation bilaterally, no wheezes, rales or rhonchi, good air movement  CV: HRRR, no peripheral edema  SKIN: small well healed hyperpigmented nudule in R groin with no signs of infection, difuse maculo/papular erythematous rash  MS: moves all extremities without noticeable abnormality  PSYCH: pleasant and cooperative, no obvious depression or anxiety  ASSESSMENT AND PLAN:  Discussed the following assessment and plan:  Rash and nonspecific skin  eruption  Fever  Medication reaction  Dizziness  Nausea alone  -73 yo M treated with Bacrim starting 8 days ago -  Onset yesterday rash, nausea, dizziness, fever, chills -widespread rash and symptoms concerning for potential severe drug reaction, SJS, TEN - advised evaluation in ED and pt and wife wish to drive to the hospital, no SOB, trouble breathing or unstable vitals -assistant notified ED staff -Patient advised to return or notify a doctor immediately if symptoms worsen or persist or new concerns arise.  There are no Patient Instructions on file for this visit.   Colin Benton R.

## 2013-08-02 NOTE — H&P (Signed)
[facility]          History and Physical    PCP:   Scarlette Calico, MD   Chief Complaint:  Rash, fever  HPI: Patient is a very pleasant 73 year old man with past medical history significant for hypertension, hyperlipidemia, status post tissue aortic valve replacement. About 8 days ago he went to see his primary care physician with complaints of a right groin infection and was started on Bactrim. Soon afterward he started experiencing some stomach upset. Yesterday he became lightheaded and developed a temperature of 101 as well as a maculopapular red rash mainly over his trunk and upper arms. Went to see his primary care physician today who sent him to the emergency department for further evaluation. He denies any throat or facial swelling, blisters or oral lesions. In the emergency department he is found to have a creatinine of 2.19 that is up from 1.1 one month ago. We have been asked to admit him for further evaluation and management.  Allergies:   Allergies  Allergen Reactions  . Sulfamethoxazole-Tmp Ds Other (See Comments)    Carlyn Reichert Syndrome      Past Medical History  Diagnosis Date  . Hyperlipidemia   . Hypertension   . Hx of colonic polyps   . CAD (coronary artery disease)   . Aortic stenosis   . Aortic valve disease   . Blood transfusion   . Arthritis   . Complication of anesthesia     hallucinated once  . H/O hiatal hernia   . Blood transfusion without reported diagnosis   . Heart murmur     AFTER REPLACED VALVE WENT AWAY    Past Surgical History  Procedure Laterality Date  . Aortic valve replacement    . Total hip replacement  2000    left  . Total knee arthroplasty  2000    left  . Cholecystectomy  1982  . Tonsillectomy    . Cardiac catheterization    . Correction hammer toe  2011    BILATERAL  . Colonoscopy    . Gastrocnemius recession  05/28/2011  . Weil osteotomy  05/28/2011  . Cardiac valve replacement  2009    aortic   . Total knee arthroplasty  12/01/2011    Procedure: TOTAL KNEE ARTHROPLASTY;  Surgeon: Mauri Pole, MD;  Location: [facility];  Service: Orthopedics;  Laterality: Right;  . Polypectomy      Prior to Admission medications   Medication Sig Start Date End Date Taking? Authorizing Provider  aspirin 325 MG tablet Take 1 tablet (325 mg total) by mouth 2 (two) times daily. Will stop 11/26/11 12/03/11  Yes Matthew Scott Babish, PA-C  atorvastatin (LIPITOR) 80 MG tablet Take 80 mg by mouth daily.   Yes Historical Provider, MD  carvedilol (COREG) 6.25 MG tablet Take 1 tablet (6.25 mg total) by mouth 2 (two) times daily. 06/22/13  Yes Fay Records, MD  furosemide (LASIX) 40 MG tablet Take 1 tablet (40 mg total) by mouth daily. 06/22/13  Yes Fay Records, MD  Multiple Vitamin (MULTIVITAMIN WITH MINERALS) TABS Take 1 tablet by mouth daily.   Yes Historical Provider, MD  olmesartan (BENICAR) 20 MG tablet Take 1 tablet (20 mg total) by mouth daily. 06/22/13 06/22/14 Yes Fay Records, MD  sulfamethoxazole-trimethoprim (BACTRIM DS,SEPTRA DS) 800-160 MG per tablet Take 1 tablet by mouth 2 (two) times daily. For 10 days. 07/24/13  Yes Janith Lima, MD    Social History:  reports that he quit smoking about 40 years ago. He has never used smokeless tobacco. He reports that he does not drink alcohol or use illicit drugs.  Family History  Problem Relation Age of Onset  . Hypertension Other   . Hyperlipidemia Other   . Colon cancer Mother     Review of Systems:  Constitutional: Denies, diaphoresis, appetite change and fatigue.  HEENT: Denies photophobia, eye pain, redness, hearing loss, ear pain, congestion, sore throat, rhinorrhea, sneezing, mouth sores, trouble swallowing, neck pain, neck stiffness and tinnitus.   Respiratory: Denies SOB, DOE, cough, chest tightness,  and wheezing.   Cardiovascular: Denies chest pain, palpitations and leg swelling.  Gastrointestinal: Denies nausea, vomiting, abdominal pain,  diarrhea, constipation, blood in stool and abdominal distention.  Genitourinary: Denies dysuria, urgency, frequency, hematuria, flank pain and difficulty urinating.  Endocrine: Denies: hot or cold intolerance, sweats, changes in hair or nails, polyuria, polydipsia. Musculoskeletal: Denies myalgias, back pain, joint swelling, arthralgias and gait problem.  Skin: Denies pallor and wound.  Neurological: Denies , seizures, syncope,  numbness and headaches.  Hematological: Denies adenopathy. Easy bruising, personal or family bleeding history  Psychiatric/Behavioral: Denies suicidal ideation, mood changes, confusion, nervousness, sleep disturbance and agitation   Physical Exam: Blood pressure 127/72, pulse 96, temperature 98.5 F (36.9 C), temperature source Oral, resp. rate 17, height 5' 11"  (1.803 m), weight 95.8 kg (211 lb 3.2 oz), SpO2 97.00%. General: Alert, awake, oriented x3, in no distress. HEENT: Normocephalic, atraumatic, pupils equal round and reactive to light, extraocular movements intact, moist mucous membranes, no oral/mucosal blisters. Neck: Supple, no JVD, no lymphadenopathy, no bruits, no goiter. Cardiovascular: Regular rate and rhythm, no murmurs, rubs or canals. Lungs:" Bilaterally. Abdomen: Soft, nontender, nondistended, positive bowel sounds, no masses organomegaly noted. Extremities: No clubbing, cyanosis or edema, positive pedal pulses. Skin: Diffuse macular papular erythematous rash mainly over his back and upper chest although some extends into the upper arms. Neurologic: Grossly intact and nonfocal.  Labs on Admission:  Results for orders placed during the hospital encounter of 08/02/13 (from the past 48 hour(s))  CBC WITH DIFFERENTIAL     Status: Abnormal   Collection Time    08/02/13  1:37 PM      Result Value Ref Range   WBC 8.6  4.0 - 10.5 K/uL   RBC 4.84  4.22 - 5.81 MIL/uL   Hemoglobin 14.8  13.0 - 17.0 g/dL   HCT 45.0  39.0 - 52.0 %   MCV 93.0  78.0 -  100.0 fL   MCH 30.6  26.0 - 34.0 pg   MCHC 32.9  30.0 - 36.0 g/dL   RDW 13.8  11.5 - 15.5 %   Platelets 183  150 - 400 K/uL   Neutrophils Relative % 81 (*) 43 - 77 %   Neutro Abs 7.0  1.7 - 7.7 K/uL   Lymphocytes Relative 6 (*) 12 - 46 %   Lymphs Abs 0.5 (*) 0.7 - 4.0 K/uL   Monocytes Relative 8  3 - 12 %   Monocytes Absolute 0.7  0.1 - 1.0 K/uL   Eosinophils Relative 5  0 - 5 %   Eosinophils Absolute 0.4  0.0 - 0.7 K/uL   Basophils Relative 0  0 - 1 %   Basophils Absolute 0.0  0.0 - 0.1 K/uL  BASIC METABOLIC PANEL     Status: Abnormal   Collection Time    08/02/13  1:37  PM      Result Value Ref Range   Sodium 136 (*) 137 - 147 mEq/L   Potassium 5.1  3.7 - 5.3 mEq/L   Chloride 96  96 - 112 mEq/L   CO2 24  19 - 32 mEq/L   Glucose, Bld 108 (*) 70 - 99 mg/dL   BUN 30 (*) 6 - 23 mg/dL   Creatinine, Ser 2.19 (*) 0.50 - 1.35 mg/dL   Calcium 9.3  8.4 - 10.5 mg/dL   GFR calc non Af Amer 28 (*) >90 mL/min   GFR calc Af Amer 33 (*) >90 mL/min   Comment: (NOTE)     The eGFR has been calculated using the CKD EPI equation.     This calculation has not been validated in all clinical situations.     eGFR's persistently <90 mL/min signify possible Chronic Kidney     Disease.  URINALYSIS, ROUTINE W REFLEX MICROSCOPIC     Status: Abnormal   Collection Time    08/02/13  1:57 PM      Result Value Ref Range   Color, Urine AMBER (*) YELLOW   Comment: BIOCHEMICALS MAY BE AFFECTED BY COLOR   APPearance TURBID (*) CLEAR   Specific Gravity, Urine 1.025  1.005 - 1.030   pH 5.0  5.0 - 8.0   Glucose, UA NEGATIVE  NEGATIVE mg/dL   Hgb urine dipstick NEGATIVE  NEGATIVE   Bilirubin Urine MODERATE (*) NEGATIVE   Ketones, ur NEGATIVE  NEGATIVE mg/dL   Protein, ur 30 (*) NEGATIVE mg/dL   Urobilinogen, UA 1.0  0.0 - 1.0 mg/dL   Nitrite NEGATIVE  NEGATIVE   Leukocytes, UA SMALL (*) NEGATIVE  URINE MICROSCOPIC-ADD ON     Status: Abnormal   Collection Time    08/02/13  1:57 PM      Result Value Ref  Range   Squamous Epithelial / LPF RARE  RARE   WBC, UA 3-6  <3 WBC/hpf   RBC / HPF 0-2  <3 RBC/hpf   Bacteria, UA MANY (*) RARE   Casts HYALINE CASTS (*) NEGATIVE   Crystals CA OXALATE CRYSTALS (*) NEGATIVE   Urine-Other MUCOUS PRESENT      Radiological Exams on Admission: No results found.  Assessment/Plan Principal Problem:   Drug reaction Active Problems:   HYPERLIPIDEMIA   Essential hypertension, benign   Aortic valve replaced   Abscess of groin, right   Fever, unspecified   ARF (acute renal failure)   Rash and nonspecific skin eruption    Drug reaction -Suspect everything including his fever, rash, acute renal failure and stomach upset all related to a reaction to the Bactrim. -Discontinue Bactrim and place on allergy list. -Start prednisone, Benadryl, Pepcid. -No concerning features such as throat swelling or difficulty breathing.  Acute renal failure -Suspect related to Bactrim. -Discontinue Lasix and ARB. -Give IV fluids. -Recheck renal function in the morning.  Abscess of right groin -Seems to be fully healed after treatment of Bactrim. -No further antibiotics required at this time.  Status post aortic valve replacement -No need for anticoagulation given this is a tissue valve.  Hypertension -Blood pressure currently stable. -Will need to monitor with discontinuation of Benicar. -If needed can add a non-nephrotoxic antihypertensive.  DVT prophylaxis -Lovenox.  CODE STATUS -Full code    Time Spent on Admission: 75 minutes  HERNANDEZ ACOSTA,ESTELA [facility] Pager: [phone] 08/02/2013, 5:26 PM

## 2013-08-02 NOTE — Telephone Encounter (Signed)
Per Dr Maudie Mercury the pt needs to go to the ER after his visit today.  I called [facility] and spoke with Erline Levine and advised her of this as the pt has been taking Septra DS and has developed a skin rash, had a temp of 101 degrees yesterday, complains of nausea, dizziness and skin eruptions since last night and she is concerned for [employer].

## 2013-08-02 NOTE — Telephone Encounter (Signed)
Pt c/o possible reaction from antiobiotic prescribed.  Pt c/o 101 temp, body aches, rash from top of head to knees and facial redness. Please advise

## 2013-08-02 NOTE — ED Provider Notes (Signed)
CSN: [id number]     Arrival date & time 08/02/13  1212 History   First MD Initiated Contact with Patient 08/02/13 1255     Chief Complaint  Patient presents with  . Allergic Reaction  . Rash   HPI  Luis Herrera is a 73 y.o. male with a PMH of hyperlipidemia, HTN, CAD, aortic stenosis, aortic valve disease, arthritis, and hiatal hernia who presents to the ED for evaluation of allergic reaction and rash.  History was provided by the patient. Patient states that he developed a rash last night on his face, chest, back, UE and LE throughout. No pruritis. No similar rash in the past. No hx of allergies, allergic reaction, or anaphylaxis in the past. No oral edema or  Patient states he feels a little lightheaded and nauseated but denies any emesis, diarrhea, chest pain, SOB or abdominal pain. He also had a fever of 101 at home yesterday. He denies any dysuria, cough, fatigue, or other symptoms. He is currently on bactrim for an abscess to the right groin which he has been on for the past 8 days. Has last dose today but did not take it. No previous drug reactions in the past.    Past Medical History  Diagnosis Date  . Hyperlipidemia   . Hypertension   . Hx of colonic polyps   . CAD (coronary artery disease)   . Aortic stenosis   . Aortic valve disease   . Blood transfusion   . Arthritis   . Complication of anesthesia     hallucinated once  . H/O hiatal hernia   . Blood transfusion without reported diagnosis   . Heart murmur     AFTER REPLACED VALVE WENT AWAY   Past Surgical History  Procedure Laterality Date  . Aortic valve replacement    . Total hip replacement  2000    left  . Total knee arthroplasty  2000    left  . Cholecystectomy  1982  . Tonsillectomy    . Cardiac catheterization    . Correction hammer toe  2011    BILATERAL  . Colonoscopy    . Gastrocnemius recession  05/28/2011  . Weil osteotomy  05/28/2011  . Cardiac valve replacement  2009    aortic  . Total knee  arthroplasty  12/01/2011    Procedure: TOTAL KNEE ARTHROPLASTY;  Surgeon: Mauri Pole, MD;  Location: [facility];  Service: Orthopedics;  Laterality: Right;  . Polypectomy     Family History  Problem Relation Age of Onset  . Hypertension Other   . Hyperlipidemia Other   . Colon cancer Mother    History  Substance Use Topics  . Smoking status: Former Smoker    Quit date: 05/25/1973  . Smokeless tobacco: Never Used  . Alcohol Use: No     Comment: rare    Review of Systems  Constitutional: Positive for fever. Negative for chills, diaphoresis, activity change, appetite change and fatigue.  HENT: Negative for congestion, sore throat and trouble swallowing.   Respiratory: Negative for cough and shortness of breath.   Gastrointestinal: Positive for nausea. Negative for vomiting, abdominal pain, diarrhea and constipation.  Genitourinary: Negative for dysuria and difficulty urinating.  Musculoskeletal: Negative for back pain and myalgias.  Skin: Positive for rash. Negative for color change and wound.  Neurological: Positive for light-headedness. Negative for dizziness, weakness, numbness and headaches.    Allergies  Review of patient's allergies indicates no known allergies.  Home Medications   Prior to  Admission medications   Medication Sig Start Date End Date Taking? Authorizing Brason Berthelot  aspirin 325 MG tablet Take 1 tablet (325 mg total) by mouth 2 (two) times daily. Will stop 11/26/11 12/03/11  Yes Matthew Scott Babish, PA-C  atorvastatin (LIPITOR) 80 MG tablet Take 80 mg by mouth daily.   Yes Historical Jshon Ibe, MD  carvedilol (COREG) 6.25 MG tablet Take 1 tablet (6.25 mg total) by mouth 2 (two) times daily. 06/22/13  Yes Fay Records, MD  furosemide (LASIX) 40 MG tablet Take 1 tablet (40 mg total) by mouth daily. 06/22/13  Yes Fay Records, MD  Multiple Vitamin (MULTIVITAMIN WITH MINERALS) TABS Take 1 tablet by mouth daily.   Yes Historical Sanjuana Mruk, MD  olmesartan (BENICAR) 20 MG  tablet Take 1 tablet (20 mg total) by mouth daily. 06/22/13 06/22/14 Yes Fay Records, MD  sulfamethoxazole-trimethoprim (BACTRIM DS,SEPTRA DS) 800-160 MG per tablet Take 1 tablet by mouth 2 (two) times daily. For 10 days. 07/24/13  Yes Janith Lima, MD   BP 104/56  Pulse 100  Temp(Src) 98.5 F (36.9 C) (Oral)  Resp 16  SpO2 99%  Filed Vitals:   08/02/13 1231 08/02/13 1502 08/02/13 1543  BP: 104/56 104/62 127/72  Pulse: 100 71 96  Temp: 98.5 F (36.9 C)  98.5 F (36.9 C)  TempSrc: Oral  Oral  Resp: 16 16 17   SpO2: 99% 99% 97%    Physical Exam  Nursing note and vitals reviewed. Constitutional: He is oriented to person, place, and time. He appears well-developed and well-nourished. No distress.  HENT:  Head: Normocephalic and atraumatic.  Right Ear: External ear normal.  Left Ear: External ear normal.  Nose: Nose normal.  Mouth/Throat: Oropharynx is clear and moist. No oropharyngeal exudate.  No oral lesions. No tongue or oral edema. No periorbital edema.   Eyes: Conjunctivae and EOM are normal. Pupils are equal, round, and reactive to light. Right eye exhibits no discharge. Left eye exhibits no discharge.  Neck: Normal range of motion. Neck supple.  Cardiovascular: Normal rate and regular rhythm.  Exam reveals no gallop and no friction rub.   Murmur heard. Grade 2 holosystolic murmur  Pulmonary/Chest: Effort normal and breath sounds normal. No respiratory distress. He has no wheezes. He has no rales. He exhibits no tenderness.  Abdominal: Soft. He exhibits no distension. There is no tenderness.  Genitourinary:  1 cm circular hypopigmented lesion to the right scrotum which is non-tender with no abscess, fluctuance, or induration. No open wounds. Rash present on scrotum.   Musculoskeletal: Normal range of motion. He exhibits no edema and no tenderness.  Neurological: He is alert and oriented to person, place, and time.  Skin: Skin is warm and dry. Rash noted. He is not  diaphoretic.  Erythematous macular-papular circular rash to the face, back, chest, UE and LE throughout with irregular borders. No open wounds. No blistering or mucosal involvement. No rash to palms or soles. No petechiae or purpura.      ED Course  Procedures (including critical care time) Labs Review Labs Reviewed - No data to display  Imaging Review No results found.   EKG Interpretation None      Results for orders placed during the hospital encounter of 08/02/13  CBC WITH DIFFERENTIAL      Result Value Ref Range   WBC 8.6  4.0 - 10.5 K/uL   RBC 4.84  4.22 - 5.81 MIL/uL   Hemoglobin 14.8  13.0 - 17.0 g/dL   HCT  45.0  39.0 - 52.0 %   MCV 93.0  78.0 - 100.0 fL   MCH 30.6  26.0 - 34.0 pg   MCHC 32.9  30.0 - 36.0 g/dL   RDW 13.8  11.5 - 15.5 %   Platelets 183  150 - 400 K/uL   Neutrophils Relative % 81 (*) 43 - 77 %   Neutro Abs 7.0  1.7 - 7.7 K/uL   Lymphocytes Relative 6 (*) 12 - 46 %   Lymphs Abs 0.5 (*) 0.7 - 4.0 K/uL   Monocytes Relative 8  3 - 12 %   Monocytes Absolute 0.7  0.1 - 1.0 K/uL   Eosinophils Relative 5  0 - 5 %   Eosinophils Absolute 0.4  0.0 - 0.7 K/uL   Basophils Relative 0  0 - 1 %   Basophils Absolute 0.0  0.0 - 0.1 K/uL  BASIC METABOLIC PANEL      Result Value Ref Range   Sodium 136 (*) 137 - 147 mEq/L   Potassium 5.1  3.7 - 5.3 mEq/L   Chloride 96  96 - 112 mEq/L   CO2 24  19 - 32 mEq/L   Glucose, Bld 108 (*) 70 - 99 mg/dL   BUN 30 (*) 6 - 23 mg/dL   Creatinine, Ser 2.19 (*) 0.50 - 1.35 mg/dL   Calcium 9.3  8.4 - 10.5 mg/dL   GFR calc non Af Amer 28 (*) >90 mL/min   GFR calc Af Amer 33 (*) >90 mL/min  URINALYSIS, ROUTINE W REFLEX MICROSCOPIC      Result Value Ref Range   Color, Urine AMBER (*) YELLOW   APPearance TURBID (*) CLEAR   Specific Gravity, Urine 1.025  1.005 - 1.030   pH 5.0  5.0 - 8.0   Glucose, UA NEGATIVE  NEGATIVE mg/dL   Hgb urine dipstick NEGATIVE  NEGATIVE   Bilirubin Urine MODERATE (*) NEGATIVE   Ketones, ur  NEGATIVE  NEGATIVE mg/dL   Protein, ur 30 (*) NEGATIVE mg/dL   Urobilinogen, UA 1.0  0.0 - 1.0 mg/dL   Nitrite NEGATIVE  NEGATIVE   Leukocytes, UA SMALL (*) NEGATIVE  URINE MICROSCOPIC-ADD ON      Result Value Ref Range   Squamous Epithelial / LPF RARE  RARE   WBC, UA 3-6  <3 WBC/hpf   RBC / HPF 0-2  <3 RBC/hpf   Bacteria, UA MANY (*) RARE   Casts HYALINE CASTS (*) NEGATIVE   Crystals CA OXALATE CRYSTALS (*) NEGATIVE   Urine-Other MUCOUS PRESENT       MDM   Luis Herrera is a 73 y.o. male with a PMH of hyperlipidemia, HTN, CAD, aortic stenosis, aortic valve disease, arthritis, and hiatal hernia who presents to the ED for evaluation of allergic reaction and rash. Patient likely has drug reaction rash from bactrim. No SJS or TEN. No signs or symptoms of anaphylaxis. Patient given Prednisone, IV fluids, Benadryl and Pepcid in the ED. Vital signs stable. Patient found to have AKI with creatinine 2.19 and BUN 30, which is increased from his baseline (WNL). No leukocytosis. UA revealed possible UTI. Urine sent for culture. Patient admitted for further evaluation and management.    Final impressions: 1. Drug reaction   2. AKI (acute kidney injury)      Mercy Moore PA-C   This patient was discussed with Dr. Leia Alf, PA-C 08/02/13 1610

## 2013-08-02 NOTE — ED Notes (Signed)
[place], MD at bedside. 

## 2013-08-02 NOTE — Progress Notes (Signed)
Pre visit review using our clinic review tool, if applicable. No additional management support is needed unless otherwise documented below in the visit note. 

## 2013-08-02 NOTE — Telephone Encounter (Signed)
He needs to be seen

## 2013-08-02 NOTE — ED Notes (Signed)
Pt states that he was on Bactrim for an abscess.  Has been taking it for 8 days.  Pt has had red face and fever since yesterday.  Today has generalized rash all over body, severe rash to back.

## 2013-08-02 NOTE — Telephone Encounter (Signed)
Pt notified, pt is scheduling an appt to see a doc within the [facility]

## 2013-08-03 DIAGNOSIS — T887XXA Unspecified adverse effect of drug or medicament, initial encounter: Secondary | ICD-10-CM | POA: Diagnosis not present

## 2013-08-03 DIAGNOSIS — I10 Essential (primary) hypertension: Secondary | ICD-10-CM | POA: Diagnosis not present

## 2013-08-03 DIAGNOSIS — N179 Acute kidney failure, unspecified: Secondary | ICD-10-CM | POA: Diagnosis not present

## 2013-08-03 DIAGNOSIS — R509 Fever, unspecified: Secondary | ICD-10-CM | POA: Diagnosis not present

## 2013-08-03 LAB — BASIC METABOLIC PANEL
BUN: 27 mg/dL — ABNORMAL HIGH (ref 6–23)
CHLORIDE: 105 meq/L (ref 96–112)
CO2: 23 meq/L (ref 19–32)
Calcium: 8.3 mg/dL — ABNORMAL LOW (ref 8.4–10.5)
Creatinine, Ser: 1.36 mg/dL — ABNORMAL HIGH (ref 0.50–1.35)
GFR calc Af Amer: 58 mL/min — ABNORMAL LOW (ref >90)
GFR calc non Af Amer: 50 mL/min — ABNORMAL LOW (ref >90)
GLUCOSE: 119 mg/dL — AB (ref 70–99)
POTASSIUM: 5.5 meq/L — AB (ref 3.7–5.3)
SODIUM: 138 meq/L (ref 137–147)

## 2013-08-03 LAB — URINE CULTURE
COLONY COUNT: NO GROWTH
Culture: NO GROWTH

## 2013-08-03 LAB — CBC
HEMATOCRIT: 36.4 % — AB (ref 39.0–52.0)
Hemoglobin: 11.8 g/dL — ABNORMAL LOW (ref 13.0–17.0)
MCH: 30 pg (ref 26.0–34.0)
MCHC: 32.4 g/dL (ref 30.0–36.0)
MCV: 92.6 fL (ref 78.0–100.0)
Platelets: 142 10*3/uL — ABNORMAL LOW (ref 150–400)
RBC: 3.93 MIL/uL — AB (ref 4.22–5.81)
RDW: 13.5 % (ref 11.5–15.5)
WBC: 7.3 10*3/uL (ref 4.0–10.5)

## 2013-08-03 MED ORDER — PREDNISONE 10 MG PO TABS: 10.0000 mg | ORAL_TABLET | Freq: Every day | ORAL | Status: DC

## 2013-08-03 MED ORDER — DIPHENHYDRAMINE HCL 25 MG PO CAPS: 25.0000 mg | ORAL_CAPSULE | Freq: Three times a day (TID) | ORAL | Status: DC | PRN

## 2013-08-03 MED ORDER — FAMOTIDINE 40 MG PO TABS: 40.0000 mg | ORAL_TABLET | Freq: Every day | ORAL | Status: DC

## 2013-08-03 NOTE — Discharge Summary (Signed)
Physician Discharge Summary  Luis Herrera QTM:[id number] DOB: 04-Jul-1940 DOA: 08/02/2013  PCP: Scarlette Calico, MD  Admit date: 08/02/2013 Discharge date: 08/03/2013  Time spent: 45 minutes  Recommendations for Outpatient Follow-up:  -Will be discharged home today. -Advised to follow up with PCP in 1 week to recheck his renal function and follow up on his rash.   Discharge Diagnoses:  Principal Problem:   Drug reaction Active Problems:   HYPERLIPIDEMIA   Essential hypertension, benign   Aortic valve replaced   Abscess of groin, right   Fever, unspecified   ARF (acute renal failure)   Rash and nonspecific skin eruption   Discharge Condition: Stable and improved  Filed Weights   08/02/13 1700  Weight: 95.8 kg (211 lb 3.2 oz)    History of present illness:  Patient is a very pleasant 73 year old man with past medical history significant for hypertension, hyperlipidemia, status post tissue aortic valve replacement. About 8 days ago he went to see his primary care physician with complaints of a right groin infection and was started on Bactrim. Soon afterward he started experiencing some stomach upset. Yesterday he became lightheaded and developed a temperature of 101 as well as a maculopapular red rash mainly over his trunk and upper arms. Went to see his primary care physician today who sent him to the emergency department for further evaluation. He denies any throat or facial swelling, blisters or oral lesions. In the emergency department he is found to have a creatinine of 2.19 that is up from 1.1 one month ago. We were asked to admit him for further evaluation and management.   Hospital Course:   Allergic Reaction to Bactrim -Bactrim has been placed on allergy list. -Will be discharged today with a prednisone taper, benadryl and pepcid for 1 week.  ARF -Suspect also related to Bactrim in the presence of lasix and ARB. -Cr has improved to 1.36 from 2.19 on admission. Appears  baseline Cr is around 1.1. -ARB has been resumed on DC. -Advised to follow up with PCP for a repeat BMET in 1 week to monitor renal function.  Abscess of right groin  -Seems to be fully healed after treatment of Bactrim.  -No further antibiotics required at this time.   Status post aortic valve replacement  -No need for anticoagulation given this is a tissue valve.   Hypertension  -Blood pressure currently stable.    Procedures:  None   Consultations:  None  Discharge Instructions  Discharge Instructions   Diet - low sodium heart healthy    Complete by:  As directed      Discontinue IV    Complete by:  As directed      Increase activity slowly    Complete by:  As directed             Medication List    STOP taking these medications       sulfamethoxazole-trimethoprim 800-160 MG per tablet  Commonly known as:  BACTRIM DS,SEPTRA DS      TAKE these medications       aspirin 325 MG tablet  Take 1 tablet (325 mg total) by mouth 2 (two) times daily. Will stop 11/26/11     atorvastatin 80 MG tablet  Commonly known as:  LIPITOR  Take 80 mg by mouth daily.     carvedilol 6.25 MG tablet  Commonly known as:  COREG  Take 1 tablet (6.25 mg total) by mouth 2 (two) times daily.  diphenhydrAMINE 25 mg capsule  Commonly known as:  BENADRYL  Take 1 capsule (25 mg total) by mouth every 8 (eight) hours as needed for itching or allergies.     famotidine 40 MG tablet  Commonly known as:  PEPCID  Take 1 tablet (40 mg total) by mouth daily.     furosemide 40 MG tablet  Commonly known as:  LASIX  Take 1 tablet (40 mg total) by mouth daily.     multivitamin with minerals Tabs tablet  Take 1 tablet by mouth daily.     olmesartan 20 MG tablet  Commonly known as:  BENICAR  Take 1 tablet (20 mg total) by mouth daily.     predniSONE 10 MG tablet  Commonly known as:  DELTASONE  Take 1 tablet (10 mg total) by mouth daily with breakfast. Take 4 tablets today and then  decrease by 1 daily until none are left.       Allergies  Allergen Reactions  . Sulfamethoxazole-Tmp Ds Other (See Comments)    Carlyn Reichert Syndrome       Follow-up Information   Follow up with Scarlette Calico, MD. Schedule an appointment as soon as possible for a visit in 1 week. (To follow up your kidney function)    Specialty:  Internal Medicine   Contact information:   [address] [address] [phone]        The results of significant diagnostics from this hospitalization (including imaging, microbiology, ancillary and laboratory) are listed below for reference.    Significant Diagnostic Studies: No results found.  Microbiology: No results found for this or any previous visit (from the past 240 hour(s)).   Labs: Basic Metabolic Panel:  Recent Labs Lab 08/02/13 1337 08/03/13 0530  NA 136* 138  K 5.1 5.5*  CL 96 105  CO2 24 23  GLUCOSE 108* 119*  BUN 30* 27*  CREATININE 2.19* 1.36*  CALCIUM 9.3 8.3*   Liver Function Tests: No results found for this basename: AST, ALT, ALKPHOS, BILITOT, PROT, ALBUMIN,  in the last 168 hours No results found for this basename: LIPASE, AMYLASE,  in the last 168 hours No results found for this basename: AMMONIA,  in the last 168 hours CBC:  Recent Labs Lab 08/02/13 1337 08/03/13 0530  WBC 8.6 7.3  NEUTROABS 7.0  --   HGB 14.8 11.8*  HCT 45.0 36.4*  MCV 93.0 92.6  PLT 183 142*   Cardiac Enzymes: No results found for this basename: CKTOTAL, CKMB, CKMBINDEX, TROPONINI,  in the last 168 hours BNP: BNP (last 3 results)  Recent Labs  06/26/13 0833  PROBNP 64.0   CBG: No results found for this basename: GLUCAP,  in the last 168 hours     Signed:  Lelon Frohlich  [facility] Pager: [phone] 08/03/2013, 3:25 PM

## 2013-08-04 ENCOUNTER — Ambulatory Visit (INDEPENDENT_AMBULATORY_CARE_PROVIDER_SITE_OTHER): Payer: Medicare Other | Admitting: Internal Medicine

## 2013-08-04 ENCOUNTER — Encounter: Payer: Self-pay | Admitting: Internal Medicine

## 2013-08-04 VITALS — BP 142/54 | HR 66 | Temp 97.6°F | Resp 18 | Wt 215.0 lb

## 2013-08-04 DIAGNOSIS — I10 Essential (primary) hypertension: Secondary | ICD-10-CM | POA: Diagnosis not present

## 2013-08-04 DIAGNOSIS — N179 Acute kidney failure, unspecified: Secondary | ICD-10-CM

## 2013-08-04 DIAGNOSIS — T50905S Adverse effect of unspecified drugs, medicaments and biological substances, sequela: Secondary | ICD-10-CM | POA: Diagnosis not present

## 2013-08-04 NOTE — Assessment & Plan Note (Signed)
Improvement noted He will cont the steroids

## 2013-08-04 NOTE — Progress Notes (Signed)
Pre visit review using our clinic review tool, if applicable. No additional management support is needed unless otherwise documented below in the visit note. 

## 2013-08-04 NOTE — Patient Instructions (Signed)

## 2013-08-04 NOTE — ED Provider Notes (Signed)
Medical screening examination/treatment/procedure(s) were conducted as a shared visit with non-physician practitioner(s) and myself.  I personally evaluated the patient during the encounter.   EKG Interpretation None     20M sent to ED for eval of rash. Began while taking Bactrim, no hx of drug reactions before. Diffuse macular rash, no papules. Blanches, no purpura or petechiae. No mucosal involvement. No concern for SJS or TEN. Labs show AKI - admitted.    Osvaldo Shipper, MD 08/04/13 [phone]

## 2013-08-04 NOTE — Assessment & Plan Note (Signed)
His BP is well controlled 

## 2013-08-04 NOTE — Progress Notes (Signed)
Subjective:    Patient ID: Luis Herrera, male    DOB: 1941-01-20, 73 y.o.   MRN: [id number]  Rash This is a recurrent problem. The current episode started in the past 7 days. The problem has been rapidly improving since onset. The rash is diffuse. The rash is characterized by redness. He was exposed to a new medication. Pertinent negatives include no anorexia, congestion, cough, diarrhea, eye pain, facial edema, fatigue, fever, joint pain, nail changes, rhinorrhea, shortness of breath, sore throat or vomiting. Past treatments include antihistamine and oral steroids. The treatment provided significant relief.      Review of Systems  Constitutional: Negative.  Negative for fever, chills, diaphoresis, appetite change and fatigue.  HENT: Negative.  Negative for congestion, rhinorrhea and sore throat.   Eyes: Negative.  Negative for pain.  Respiratory: Negative.  Negative for apnea, cough, choking, chest tightness, shortness of breath, wheezing and stridor.   Cardiovascular: Negative.  Negative for chest pain, palpitations and leg swelling.  Gastrointestinal: Negative.  Negative for vomiting, abdominal pain, diarrhea, constipation, blood in stool and anorexia.  Endocrine: Negative.   Genitourinary: Negative.  Negative for dysuria, urgency, frequency, hematuria, flank pain, decreased urine volume and difficulty urinating.  Musculoskeletal: Negative.  Negative for arthralgias, joint pain, neck pain and neck stiffness.  Skin: Positive for color change and rash. Negative for nail changes, pallor and wound.  Allergic/Immunologic: Negative.   Neurological: Negative.  Negative for dizziness, tremors, weakness, light-headedness, numbness and headaches.  Hematological: Negative.  Negative for adenopathy. Does not bruise/bleed easily.  Psychiatric/Behavioral: Negative.        Objective:   Physical Exam  Vitals reviewed. Constitutional: He is oriented to person, place, and time. He appears  well-developed and well-nourished. No distress.  HENT:  Head: Normocephalic and atraumatic.  Mouth/Throat: Oropharynx is clear and moist. No oropharyngeal exudate.  Eyes: Conjunctivae are normal. Right eye exhibits no discharge. Left eye exhibits no discharge. No scleral icterus.  Neck: Normal range of motion. Neck supple. No JVD present. No tracheal deviation present. No thyromegaly present.  Cardiovascular: Normal rate, regular rhythm and intact distal pulses.  Exam reveals no gallop and no friction rub.   Murmur heard. Pulmonary/Chest: Effort normal and breath sounds normal. No stridor. No respiratory distress. He has no wheezes. He has no rales. He exhibits no tenderness.  Abdominal: Soft. Bowel sounds are normal. He exhibits no distension and no mass. There is no tenderness. There is no rebound and no guarding.  Musculoskeletal: Normal range of motion. He exhibits no edema and no tenderness.  Lymphadenopathy:    He has no cervical adenopathy.  Neurological: He is oriented to person, place, and time.  Skin: Skin is warm, dry and intact. Rash noted. No petechiae and no purpura noted. Rash is macular, papular and maculopapular. Rash is not nodular, not pustular and not urticarial. He is not diaphoretic. No pallor.  Psychiatric: He has a normal mood and affect. His behavior is normal. Judgment and thought content normal.    Lab Results  Component Value Date   WBC 7.3 08/03/2013   HGB 11.8* 08/03/2013   HCT 36.4* 08/03/2013   PLT 142* 08/03/2013   GLUCOSE 119* 08/03/2013   CHOL 142 06/26/2013   TRIG 79.0 06/26/2013   HDL 50.00 06/26/2013   LDLCALC 76 06/26/2013   ALT 23 06/26/2013   AST 27 06/26/2013   NA 138 08/03/2013   K 5.5* 08/03/2013   CL 105 08/03/2013   CREATININE 1.36* 08/03/2013   BUN  27* 08/03/2013   CO2 23 08/03/2013   TSH 3.34 06/26/2013   PSA 0.98 09/01/2010   INR 0.97 11/25/2011        Assessment & Plan:

## 2013-08-04 NOTE — Assessment & Plan Note (Signed)
His renal function is improving

## 2013-08-05 ENCOUNTER — Telehealth: Payer: Self-pay | Admitting: Internal Medicine

## 2013-08-05 NOTE — Telephone Encounter (Signed)
Relevant patient education assigned to patient using Emmi. ° °

## 2013-08-14 ENCOUNTER — Ambulatory Visit (INDEPENDENT_AMBULATORY_CARE_PROVIDER_SITE_OTHER): Payer: Medicare Other | Admitting: Internal Medicine

## 2013-08-14 ENCOUNTER — Encounter: Payer: Self-pay | Admitting: Internal Medicine

## 2013-08-14 ENCOUNTER — Other Ambulatory Visit (INDEPENDENT_AMBULATORY_CARE_PROVIDER_SITE_OTHER): Payer: Medicare Other

## 2013-08-14 VITALS — BP 110/53 | HR 70 | Temp 97.7°F | Resp 16 | Ht 71.0 in | Wt 212.0 lb

## 2013-08-14 DIAGNOSIS — I10 Essential (primary) hypertension: Secondary | ICD-10-CM

## 2013-08-14 DIAGNOSIS — N179 Acute kidney failure, unspecified: Secondary | ICD-10-CM

## 2013-08-14 DIAGNOSIS — T50905S Adverse effect of unspecified drugs, medicaments and biological substances, sequela: Secondary | ICD-10-CM

## 2013-08-14 DIAGNOSIS — Z23 Encounter for immunization: Secondary | ICD-10-CM

## 2013-08-14 LAB — CBC WITH DIFFERENTIAL/PLATELET
Basophils Absolute: 0.1 10*3/uL (ref 0.0–0.1)
Basophils Relative: 1 % (ref 0.0–3.0)
EOS PCT: 4.3 % (ref 0.0–5.0)
Eosinophils Absolute: 0.4 10*3/uL (ref 0.0–0.7)
HEMATOCRIT: 38.8 % — AB (ref 39.0–52.0)
Hemoglobin: 12.8 g/dL — ABNORMAL LOW (ref 13.0–17.0)
LYMPHS ABS: 1.8 10*3/uL (ref 0.7–4.0)
Lymphocytes Relative: 21.1 % (ref 12.0–46.0)
MCHC: 32.8 g/dL (ref 30.0–36.0)
MCV: 92.7 fl (ref 78.0–100.0)
MONO ABS: 0.6 10*3/uL (ref 0.1–1.0)
Monocytes Relative: 7.1 % (ref 3.0–12.0)
Neutro Abs: 5.7 10*3/uL (ref 1.4–7.7)
Neutrophils Relative %: 66.5 % (ref 43.0–77.0)
Platelets: 197 10*3/uL (ref 150.0–400.0)
RBC: 4.19 Mil/uL — ABNORMAL LOW (ref 4.22–5.81)
RDW: 14.6 % (ref 11.5–15.5)
WBC: 8.6 10*3/uL (ref 4.0–10.5)

## 2013-08-14 LAB — BASIC METABOLIC PANEL
BUN: 20 mg/dL (ref 6–23)
CALCIUM: 8.8 mg/dL (ref 8.4–10.5)
CO2: 27 mEq/L (ref 19–32)
Chloride: 105 mEq/L (ref 96–112)
Creatinine, Ser: 1 mg/dL (ref 0.4–1.5)
GFR: 80.79 mL/min (ref >60.00)
GLUCOSE: 105 mg/dL — AB (ref 70–99)
Potassium: 4.3 mEq/L (ref 3.5–5.1)
SODIUM: 141 meq/L (ref 135–145)

## 2013-08-14 NOTE — Progress Notes (Signed)
Pre visit review using our clinic review tool, if applicable. No additional management support is needed unless otherwise documented below in the visit note. 

## 2013-08-14 NOTE — Progress Notes (Signed)
   Subjective:    Patient ID: Luis Herrera, male    DOB: 06-21-1940, 73 y.o.   MRN: [id number]  HPI Comments: He returns for f/up, the rash has resolved. He feels well and offers no complaints.     Review of Systems  Constitutional: Negative.  Negative for fever, chills, diaphoresis, appetite change and fatigue.  HENT: Negative.   Eyes: Negative.   Respiratory: Negative.  Negative for cough, choking, chest tightness, shortness of breath and stridor.   Cardiovascular: Negative.  Negative for chest pain, palpitations and leg swelling.  Gastrointestinal: Negative.  Negative for nausea, vomiting, abdominal pain, diarrhea, constipation and blood in stool.  Endocrine: Negative.   Genitourinary: Negative.   Musculoskeletal: Negative.  Negative for arthralgias, back pain and myalgias.  Skin: Negative.  Negative for color change, pallor, rash and wound.  Allergic/Immunologic: Negative.   Neurological: Negative.  Negative for dizziness, tremors, syncope, facial asymmetry, weakness, light-headedness and numbness.  Hematological: Negative.  Negative for adenopathy. Does not bruise/bleed easily.  Psychiatric/Behavioral: Negative.        Objective:   Physical Exam  Vitals reviewed. Constitutional: He is oriented to person, place, and time. He appears well-developed and well-nourished. No distress.  HENT:  Head: Normocephalic and atraumatic.  Mouth/Throat: Oropharynx is clear and moist. No oropharyngeal exudate.  Eyes: Conjunctivae are normal. Right eye exhibits no discharge. Left eye exhibits no discharge. No scleral icterus.  Neck: Normal range of motion. Neck supple. No JVD present. No tracheal deviation present. No thyromegaly present.  Cardiovascular: Normal rate, regular rhythm, S1 normal, S2 normal, intact distal pulses and normal pulses.  Exam reveals no gallop and no friction rub.   Murmur heard. Pulmonary/Chest: Effort normal and breath sounds normal. No stridor. No respiratory  distress. He has no wheezes. He has no rales. He exhibits no tenderness.  Abdominal: Soft. Bowel sounds are normal. He exhibits no distension and no mass. There is no tenderness. There is no rebound and no guarding.  Musculoskeletal: Normal range of motion. He exhibits no edema and no tenderness.  Lymphadenopathy:    He has no cervical adenopathy.  Neurological: He is oriented to person, place, and time.  Skin: Skin is warm and dry. No rash noted. He is not diaphoretic. No erythema. No pallor.    Lab Results  Component Value Date   WBC 7.3 08/03/2013   HGB 11.8* 08/03/2013   HCT 36.4* 08/03/2013   PLT 142* 08/03/2013   GLUCOSE 119* 08/03/2013   CHOL 142 06/26/2013   TRIG 79.0 06/26/2013   HDL 50.00 06/26/2013   LDLCALC 76 06/26/2013   ALT 23 06/26/2013   AST 27 06/26/2013   NA 138 08/03/2013   K 5.5* 08/03/2013   CL 105 08/03/2013   CREATININE 1.36* 08/03/2013   BUN 27* 08/03/2013   CO2 23 08/03/2013   TSH 3.34 06/26/2013   PSA 0.98 09/01/2010   INR 0.97 11/25/2011        Assessment & Plan:

## 2013-08-14 NOTE — Patient Instructions (Signed)
Hypertension Hypertension, commonly called high blood pressure, is when the force of blood pumping through your arteries is too strong. Your arteries are the blood vessels that carry blood from your heart throughout your body. A blood pressure reading consists of a higher number over a lower number, such as 110/72. The higher number (systolic) is the pressure inside your arteries when your heart pumps. The lower number (diastolic) is the pressure inside your arteries when your heart relaxes. Ideally you want your blood pressure below 120/80. Hypertension forces your heart to work harder to pump blood. Your arteries may become narrow or stiff. Having hypertension puts you at risk for heart disease, stroke, and other problems.  RISK FACTORS Some risk factors for high blood pressure are controllable. Others are not.  Risk factors you cannot control include:   Race. You may be at higher risk if you are African American.  Age. Risk increases with age.  Gender. Men are at higher risk than women before age 45 years. After age 65, women are at higher risk than men. Risk factors you can control include:  Not getting enough exercise or physical activity.  Being overweight.  Getting too much fat, sugar, calories, or salt in your diet.  Drinking too much alcohol. SIGNS AND SYMPTOMS Hypertension does not usually cause signs or symptoms. Extremely high blood pressure (hypertensive crisis) may cause headache, anxiety, shortness of breath, and nosebleed. DIAGNOSIS  To check if you have hypertension, your health care Anairis Knick will measure your blood pressure while you are seated, with your arm held at the level of your heart. It should be measured at least twice using the same arm. Certain conditions can cause a difference in blood pressure between your right and left arms. A blood pressure reading that is higher than normal on one occasion does not mean that you need treatment. If one blood pressure reading  is high, ask your health care Keiton Cosma about having it checked again. TREATMENT  Treating high blood pressure includes making lifestyle changes and possibly taking medication. Living a healthy lifestyle can help lower high blood pressure. You may need to change some of your habits. Lifestyle changes may include:  Following the DASH diet. This diet is high in fruits, vegetables, and whole grains. It is low in salt, red meat, and added sugars.  Getting at least 2 1/2 hours of brisk physical activity every week.  Losing weight if necessary.  Not smoking.  Limiting alcoholic beverages.  Learning ways to reduce stress. If lifestyle changes are not enough to get your blood pressure under control, your health care Tyla Burgner may prescribe medicine. You may need to take more than one. Work closely with your health care Jamaury Gumz to understand the risks and benefits. HOME CARE INSTRUCTIONS  Have your blood pressure rechecked as directed by your health care Eastyn Dattilo.   Only take medicine as directed by your health care Shiva Sahagian. Follow the directions carefully. Blood pressure medicines must be taken as prescribed. The medicine does not work as well when you skip doses. Skipping doses also puts you at risk for problems.   Do not smoke.   Monitor your blood pressure at home as directed by your health care Zareen Jamison. SEEK MEDICAL CARE IF:   You think you are having a reaction to medicines taken.  You have recurrent headaches or feel dizzy.  You have swelling in your ankles.  You have trouble with your vision. SEEK IMMEDIATE MEDICAL CARE IF:  You develop a severe headache or   confusion.  You have unusual weakness, numbness, or feel faint.  You have severe chest or abdominal pain.  You vomit repeatedly.  You have trouble breathing. MAKE SURE YOU:   Understand these instructions.  Will watch your condition.  Will get help right away if you are not doing well or get  worse. Document Released: 02/02/2005 Document Revised: 02/07/2013 Document Reviewed: 11/25/2012 Exit[employer] Patient Information 2015 [employer]. This information is not intended to replace advice given to you by your health care Laiyah Exline. Make sure you discuss any questions you have with your health care Tristyn Pharris.  

## 2013-08-15 ENCOUNTER — Encounter: Payer: Self-pay | Admitting: Internal Medicine

## 2013-08-15 NOTE — Assessment & Plan Note (Signed)
This has resolved.

## 2013-08-15 NOTE — Assessment & Plan Note (Signed)
His renal function has recovered

## 2013-10-06 ENCOUNTER — Encounter: Payer: Self-pay | Admitting: Internal Medicine

## 2013-12-20 ENCOUNTER — Ambulatory Visit: Payer: Medicare Other | Admitting: Internal Medicine

## 2013-12-24 NOTE — Progress Notes (Signed)
HPI Patient is a 73 yo who was previously followed by Madelaine Etienne at [facility].  Hx of AV disease (s/p AVR)  Also history of CAD, HTN.  Dr Rock Nephew is leaving [facility] and he was referred here for continued care.  When I saw him in May for the first time he noted some spells of dizzienss with standing.  Since seen he has had occasional spells He does say over the past few wks he has had spells of chest tightness  Substernal  Points to low sternum  Radiates to back   Not associated with activity.  Last last a couplle of min  oGo awah Denies SOB with activity.  No chest pressure with activity REcently got back from [place] Reactions  . Sulfamethoxazole-Trimethoprim Other (See Comments)    Carlyn Reichert Syndrome    Current Outpatient Prescriptions  Medication Sig Dispense Refill  . aspirin 325 MG tablet Take 1 tablet (325 mg total) by mouth 2 (two) times daily. Will stop 11/26/11    . atorvastatin (LIPITOR) 80 MG tablet Take 80 mg by mouth daily.    . carvedilol (COREG) 6.25 MG tablet Take 1 tablet (6.25 mg total) by mouth 2 (two) times daily. 180 tablet 3  . furosemide (LASIX) 40 MG tablet Take 1 tablet (40 mg total) by mouth daily. 90 tablet 3  . Multiple Vitamin (MULTIVITAMIN WITH MINERALS) TABS Take 1 tablet by mouth daily.    Marland Kitchen olmesartan (BENICAR) 20 MG tablet Take 1 tablet (20 mg total) by mouth daily. 90 tablet 3  . amoxicillin (AMOXIL) 500 MG capsule Take two grams prior to dental procedure 4 capsule 0   No current facility-administered medications for this visit.    Past Medical History  Diagnosis Date  . Hyperlipidemia   . Hypertension   . Hx of colonic polyps   . CAD (coronary artery disease)   . Aortic stenosis   . Aortic valve disease   . Blood transfusion   . Arthritis   . Complication of anesthesia     hallucinated once  . H/O hiatal hernia   . Blood transfusion without reported diagnosis   . Heart murmur     AFTER REPLACED VALVE WENT AWAY     Past Surgical History  Procedure Laterality Date  . Aortic valve replacement    . Total hip replacement  2000    left  . Total knee arthroplasty  2000    left  . Cholecystectomy  1982  . Tonsillectomy    . Cardiac catheterization    . Correction hammer toe  2011    BILATERAL  . Colonoscopy    . Gastrocnemius recession  05/28/2011  . Weil osteotomy  05/28/2011  . Cardiac valve replacement  2009    aortic  . Total knee arthroplasty  12/01/2011    Procedure: TOTAL KNEE ARTHROPLASTY;  Surgeon: Mauri Pole, MD;  Location: [facility]S;  Service: Orthopedics;  Laterality: Right;  . Polypectomy      Family History  Problem Relation Age of Onset  . Hypertension Other   . Hyperlipidemia Other   . Colon cancer Mother     History   Social History  . Marital Status: Married    Spouse Name: N/A    Number of Children: N/A  . Years of Education: N/A   Occupational History  . retired    Social History Main Topics  . Smoking status: Former Smoker    Quit date:  05/25/1973  . Smokeless tobacco: Never Used  . Alcohol Use: No     Comment: rare  . Drug Use: No  . Sexual Activity: Not Currently   Other Topics Concern  . Not on file   Social History Narrative   Regular exercise-yes.    Review of Systems:  All systems reviewed.  They are negative to the above problem except as previously stated.  Vital Signs: BP 140/68 mmHg  Pulse 72  Ht 5\' 11"  (1.803 m)  Wt 218 lb 6.4 oz (99.066 kg)  BMI 30.47 kg/m2  Physical Exam Patient is in NAD HEENT:  Normocephalic, atraumatic. EOMI, PERRLA.  Neck: JVP is normal.  No bruits.  Lungs: clear to auscultation. No rales no wheezes.  Heart: Regular rate and rhythm. Normal S1, S2. No S3.  I/VI systolic murmur  Crisp valve sounds. PMI not displaced.  Abdomen:  Supple, nontender. Normal bowel sounds. No masses. No hepatomegaly.  Extremities:   Good distal pulses throughout. 1+ lower extremity edema.  Musculoskeletal :moving all  extremities.  Neuro:   alert and oriented x3.  CN II-XII grossly intact.  EKG  SR 72  RBBB.  LVH  Freq PVC   Assessment and Plan: 1.  Chest tightnessn  Not sure what is causing spells  I am not convinced of angina  Occur at rest. I would recomm checking labs  Set up for echo  Try pepcid OTC  2.  PVCs  New  Repeat EKG in clinic with chest pressure without ectopy  3.  Dizziness  Minimal complaint of   4.  HTN  Adequate contro  4.  AV disease  S/P AVR  Will get echo

## 2013-12-25 ENCOUNTER — Encounter: Payer: Self-pay | Admitting: Internal Medicine

## 2013-12-25 ENCOUNTER — Telehealth: Payer: Self-pay | Admitting: Internal Medicine

## 2013-12-25 ENCOUNTER — Ambulatory Visit (INDEPENDENT_AMBULATORY_CARE_PROVIDER_SITE_OTHER): Payer: Medicare Other | Admitting: Internal Medicine

## 2013-12-25 ENCOUNTER — Ambulatory Visit (HOSPITAL_COMMUNITY)
Admission: RE | Admit: 2013-12-25 | Discharge: 2013-12-25 | Disposition: A | Discharge status: Home / Self Care | Payer: Medicare Other | Source: Ambulatory Visit | Attending: Internal Medicine | Admitting: Internal Medicine

## 2013-12-25 VITALS — BP 140/68 | HR 72 | Ht 71.0 in | Wt 218.4 lb

## 2013-12-25 DIAGNOSIS — R791 Abnormal coagulation profile: Secondary | ICD-10-CM | POA: Insufficient documentation

## 2013-12-25 DIAGNOSIS — Z952 Presence of prosthetic heart valve: Secondary | ICD-10-CM | POA: Diagnosis not present

## 2013-12-25 DIAGNOSIS — I493 Ventricular premature depolarization: Secondary | ICD-10-CM | POA: Diagnosis not present

## 2013-12-25 DIAGNOSIS — I10 Essential (primary) hypertension: Secondary | ICD-10-CM

## 2013-12-25 DIAGNOSIS — E785 Hyperlipidemia, unspecified: Secondary | ICD-10-CM | POA: Diagnosis not present

## 2013-12-25 DIAGNOSIS — R7989 Other specified abnormal findings of blood chemistry: Secondary | ICD-10-CM

## 2013-12-25 DIAGNOSIS — R079 Chest pain, unspecified: Secondary | ICD-10-CM | POA: Diagnosis not present

## 2013-12-25 DIAGNOSIS — Z954 Presence of other heart-valve replacement: Secondary | ICD-10-CM

## 2013-12-25 LAB — BASIC METABOLIC PANEL
BUN: 19 mg/dL (ref 6–23)
CALCIUM: 9.3 mg/dL (ref 8.4–10.5)
CHLORIDE: 103 meq/L (ref 96–112)
CO2: 31 mEq/L (ref 19–32)
CREATININE: 0.9 mg/dL (ref 0.4–1.5)
GFR: 84.73 mL/min (ref >60.00)
Glucose, Bld: 65 mg/dL — ABNORMAL LOW (ref 70–99)
Potassium: 4.5 mEq/L (ref 3.5–5.1)
Sodium: 142 mEq/L (ref 135–145)

## 2013-12-25 LAB — CBC
HCT: 41.4 % (ref 39.0–52.0)
Hemoglobin: 13.4 g/dL (ref 13.0–17.0)
MCHC: 32.4 g/dL (ref 30.0–36.0)
MCV: 91.6 fl (ref 78.0–100.0)
Platelets: 175 10*3/uL (ref 150.0–400.0)
RBC: 4.52 Mil/uL (ref 4.22–5.81)
RDW: 13.8 % (ref 11.5–15.5)
WBC: 7.8 10*3/uL (ref 4.0–10.5)

## 2013-12-25 LAB — LIPASE: LIPASE: 58 U/L (ref 11.0–59.0)

## 2013-12-25 LAB — D-DIMER, QUANTITATIVE (NOT AT ARMC): D DIMER QUANT: 1.03 ug{FEU}/mL — AB (ref 0.00–0.48)

## 2013-12-25 LAB — AMYLASE: Amylase: 100 U/L (ref 27–131)

## 2013-12-25 LAB — BRAIN NATRIURETIC PEPTIDE: PRO B NATRI PEPTIDE: 68 pg/mL (ref 0.0–100.0)

## 2013-12-25 MED ORDER — IOHEXOL 350 MG/ML SOLN
100.0000 mL | Freq: Once | INTRAVENOUS | Status: AC | PRN
  Administered 2013-12-25: 100 mL via INTRAVENOUS

## 2013-12-25 MED ORDER — AMOXICILLIN 500 MG PO CAPS: ORAL_CAPSULE | ORAL | Status: DC

## 2013-12-25 NOTE — Patient Instructions (Signed)
Your physician has recommended that you wear a holter monitor. Holter monitors are medical devices that record the heart's electrical activity. Doctors most often use these monitors to diagnose arrhythmias. Arrhythmias are problems with the speed or rhythm of the heartbeat. The monitor is a small, portable device. You can wear one while you do your normal daily activities. This is usually used to diagnose what is causing palpitations/syncope (passing out).  Your physician recommends that you return for lab work today (BMET, BNP, CBC, AMYLASE, LIPASE, DDIMER)  Your physician has requested that you have an echocardiogram. Echocardiography is a painless test that uses sound waves to create images of your heart. It provides your doctor with information about the size and shape of your heart and how well your heart's chambers and valves are working. This procedure takes approximately one hour. There are no restrictions for this procedure.  Your physician has recommended you make the following change in your medication: 1) PEPCID--OVER THE COUNTER AS DIRECTED.

## 2013-12-25 NOTE — Telephone Encounter (Signed)
New message     Pt was seen today----want lab results

## 2013-12-25 NOTE — Telephone Encounter (Signed)
Results reviewed by Dr. Harrington Challenger with patient. See lab notes.

## 2013-12-28 ENCOUNTER — Encounter: Payer: Self-pay | Admitting: *Deleted

## 2013-12-28 ENCOUNTER — Ambulatory Visit (HOSPITAL_COMMUNITY): Payer: Medicare Other | Attending: Cardiology | Admitting: Radiology

## 2013-12-28 ENCOUNTER — Encounter (INDEPENDENT_AMBULATORY_CARE_PROVIDER_SITE_OTHER): Payer: Medicare Other

## 2013-12-28 DIAGNOSIS — Z87891 Personal history of nicotine dependence: Secondary | ICD-10-CM | POA: Insufficient documentation

## 2013-12-28 DIAGNOSIS — Z954 Presence of other heart-valve replacement: Secondary | ICD-10-CM | POA: Diagnosis not present

## 2013-12-28 DIAGNOSIS — I493 Ventricular premature depolarization: Secondary | ICD-10-CM

## 2013-12-28 DIAGNOSIS — I359 Nonrheumatic aortic valve disorder, unspecified: Secondary | ICD-10-CM | POA: Diagnosis not present

## 2013-12-28 DIAGNOSIS — I10 Essential (primary) hypertension: Secondary | ICD-10-CM

## 2013-12-28 DIAGNOSIS — Z952 Presence of prosthetic heart valve: Secondary | ICD-10-CM

## 2013-12-28 DIAGNOSIS — E785 Hyperlipidemia, unspecified: Secondary | ICD-10-CM | POA: Insufficient documentation

## 2013-12-28 NOTE — Progress Notes (Signed)
Echocardiogram performed.  

## 2013-12-28 NOTE — Progress Notes (Signed)
Patient ID: Luis Herrera, male   DOB: Jun 15, 1940, 73 y.o.   MRN: [id number] [facility] 24 hour holter monitor applied to patient.

## 2014-01-04 ENCOUNTER — Encounter: Payer: Medicare Other | Admitting: Internal Medicine

## 2014-03-28 DIAGNOSIS — H40013 Open angle with borderline findings, low risk, bilateral: Secondary | ICD-10-CM | POA: Diagnosis not present

## 2014-03-28 DIAGNOSIS — H3531 Nonexudative age-related macular degeneration: Secondary | ICD-10-CM | POA: Diagnosis not present

## 2014-03-28 DIAGNOSIS — H2513 Age-related nuclear cataract, bilateral: Secondary | ICD-10-CM | POA: Diagnosis not present

## 2014-04-10 DIAGNOSIS — H40013 Open angle with borderline findings, low risk, bilateral: Secondary | ICD-10-CM | POA: Diagnosis not present

## 2014-04-20 ENCOUNTER — Other Ambulatory Visit: Payer: Self-pay | Admitting: Internal Medicine

## 2014-06-27 ENCOUNTER — Telehealth: Payer: Self-pay | Admitting: Internal Medicine

## 2014-06-27 NOTE — Telephone Encounter (Signed)
Pt states his BP has been running about 150/75, this morning BP was 149/75. Pt took her BP about 3 hours after taking his Benicar 20 mg and Coreg 6.25 mg. Pt states he has been exercising and has been in a diet for 3 weeks. He has lost 10 lbs. Pt  Thinks that, because he is exercising every day and watching what she eats, his BP should be lower that what it is now. Pt denies any symptoms. Pt is aware that I will send this message to MD for recommendations.

## 2014-06-27 NOTE — Telephone Encounter (Signed)
Pt c/o BP issue: STAT if pt c/o blurred vision, one-sided weakness or slurred speech  1. What are your last 5 BP readings? 150/75 today  2. Are you having any other symptoms (ex. Dizziness, headache, blurred vision, passed out)? No  3. What is your BP issue? He stated it shouldn't be high.

## 2014-06-29 NOTE — Telephone Encounter (Signed)
Have him continue to keep log of BP Sched him to  See me in next several wks. Bring BP cuff in with him at that time to check accuracy

## 2014-06-29 NOTE — Telephone Encounter (Signed)
Spoke with Luis Herrera and informed him of Dr. Alan Ripper orders. Luis Herrera has appt with Dr. Harrington Challenger 6/27. Luis Herrera concerned about BP being up because he does exercise and diets so he doesn't understand why it is going up. Informed Luis Herrera to keep log as requested by Dr. Harrington Challenger and to contact our office before appt if BP becomes higher or he has other symptoms. Luis Herrera verbalized understanding and was in agreement with this plan.

## 2014-07-06 ENCOUNTER — Ambulatory Visit: Payer: Medicare Other | Admitting: Internal Medicine

## 2014-07-13 ENCOUNTER — Ambulatory Visit: Payer: Medicare Other | Admitting: Internal Medicine

## 2014-07-18 ENCOUNTER — Other Ambulatory Visit: Payer: Self-pay | Admitting: Internal Medicine

## 2014-08-09 ENCOUNTER — Other Ambulatory Visit: Payer: Self-pay | Admitting: Internal Medicine

## 2014-08-11 ENCOUNTER — Other Ambulatory Visit: Payer: Self-pay | Admitting: Internal Medicine

## 2014-08-13 ENCOUNTER — Other Ambulatory Visit: Payer: Self-pay | Admitting: *Deleted

## 2014-08-13 ENCOUNTER — Ambulatory Visit: Payer: Medicare Other | Admitting: Internal Medicine

## 2014-08-13 MED ORDER — ATORVASTATIN CALCIUM 80 MG PO TABS: 80.0000 mg | ORAL_TABLET | Freq: Every day | ORAL | Status: DC

## 2014-09-20 ENCOUNTER — Encounter: Payer: Self-pay | Admitting: Internal Medicine

## 2014-09-20 ENCOUNTER — Ambulatory Visit (INDEPENDENT_AMBULATORY_CARE_PROVIDER_SITE_OTHER): Payer: Medicare Other | Admitting: Internal Medicine

## 2014-09-20 VITALS — BP 110/56 | HR 73 | Ht 71.0 in | Wt 207.1 lb

## 2014-09-20 DIAGNOSIS — E785 Hyperlipidemia, unspecified: Secondary | ICD-10-CM

## 2014-09-20 DIAGNOSIS — I10 Essential (primary) hypertension: Secondary | ICD-10-CM

## 2014-09-20 DIAGNOSIS — Z952 Presence of prosthetic heart valve: Secondary | ICD-10-CM

## 2014-09-20 DIAGNOSIS — Z954 Presence of other heart-valve replacement: Secondary | ICD-10-CM | POA: Diagnosis not present

## 2014-09-20 MED ORDER — OLMESARTAN MEDOXOMIL 20 MG PO TABS: 20.0000 mg | ORAL_TABLET | Freq: Every day | ORAL | Status: DC

## 2014-09-20 NOTE — Progress Notes (Signed)
Cardiology Office Note   Date:  09/20/2014   ID:  Luis Herrera, DOB Feb 04, 1941, MRN [id number]  PCP:  Luis Calico, MD  Cardiologist:   [name], MD   No chief complaint on file.  F/U of PVCs     History of Present Illness: Luis Herrera is a 74 y.o. male with a history of  AV disease (s/p AVR), CAD, HTN.  I saw him in NOvember.  He was previously followed by Luis Herrera at [facility].   EKG at that visist showed PVCs  With this recomm  Normal LV function  Moderate LVH  AV bioprosthesis moved well  Mean gradient 22 mm Hg   CT of chest showed no PE    Pt called in June  BP was 150s    Now running better  / good  He lost some wt and wonders if this is helping   Drinkds about 2 L per day  Denies CP  No SOB  Tr edema.    Current Outpatient Prescriptions  Medication Sig Dispense Refill  . aspirin 325 MG tablet Take 1 tablet (325 mg total) by mouth 2 (two) times daily. Will stop 11/26/11    . atorvastatin (LIPITOR) 80 MG tablet Take 1 tablet (80 mg total) by mouth daily. 90 tablet 0  . BENICAR 20 MG tablet TAKE 1 TABLET BY MOUTH EVERY DAY 90 tablet 1  . carvedilol (COREG) 6.25 MG tablet TAKE 1 TABLET (6.25 MG TOTAL) BY MOUTH 2 (TWO) TIMES DAILY. 180 tablet 3  . furosemide (LASIX) 40 MG tablet TAKE 1 TABLET (40 MG TOTAL) BY MOUTH DAILY. 90 tablet 1  . Multiple Vitamin (MULTIVITAMIN WITH MINERALS) TABS Take 1 tablet by mouth daily.    Marland Kitchen amoxicillin (AMOXIL) 500 MG capsule Take two grams prior to dental procedure (Patient not taking: Reported on 09/20/2014) 4 capsule 0   No current facility-administered medications for this visit.    Allergies:   Sulfamethoxazole-trimethoprim   Past Medical History  Diagnosis Date  . Hyperlipidemia   . Hypertension   . Hx of colonic polyps   . CAD (coronary artery disease)   . Aortic stenosis   . Aortic valve disease   . Blood transfusion   . Arthritis   . Complication of anesthesia     hallucinated once  . H/O hiatal hernia   . Blood  transfusion without reported diagnosis   . Heart murmur     AFTER REPLACED VALVE WENT AWAY    Past Surgical History  Procedure Laterality Date  . Aortic valve replacement    . Total hip replacement  2000    left  . Total knee arthroplasty  2000    left  . Cholecystectomy  1982  . Tonsillectomy    . Cardiac catheterization    . Correction hammer toe  2011    BILATERAL  . Colonoscopy    . Gastrocnemius recession  05/28/2011  . Weil osteotomy  05/28/2011  . Cardiac valve replacement  2009    aortic  . Total knee arthroplasty  12/01/2011    Procedure: TOTAL KNEE ARTHROPLASTY;  Surgeon: Mauri Pole, MD;  Location: [facility];  Service: Orthopedics;  Laterality: Right;  . Polypectomy       Social History:  The patient  reports that he quit smoking about 41 years ago. He has never used smokeless tobacco. He reports that he does not drink alcohol or use illicit drugs.   Family History:  The patient's family  history includes Colon cancer in his mother; Hyperlipidemia in his other; Hypertension in his other.    ROS:  Please see the history of present illness. All other systems are reviewed and  Negative to the above problem except as noted.    PHYSICAL EXAM: VS:  BP 110/56 mmHg  Pulse 73  Ht 5\' 11"  (1.803 m)  Wt 207 lb 1.9 oz (93.949 kg)  BMI 28.90 kg/m2  GEN: Well nourished, well developed, in no acute distress HEENT: normal Neck: no JVD, carotid bruits, or masses Cardiac: RRR; Gr II/VI systolic murmur LSB  No rubs, or gallops, Tr edema  Respiratory:  clear to auscultation bilaterally, normal work of breathing GI: soft, nontender, nondistended, + BS  No hepatomegaly  MS: no deformity Moving all extremities   Skin: warm and dry, no rash Neuro:  Strength and sensation are intact Psych: euthymic mood, full affect   EKG:  EKG is ordered today.   Lipid Panel    Component Value Date/Time   CHOL 142 06/26/2013 0833   TRIG 79.0 06/26/2013 0833   HDL 50.00 06/26/2013 0833    CHOLHDL 3 06/26/2013 0833   VLDL 15.8 06/26/2013 0833   LDLCALC 76 06/26/2013 0833      Wt Readings from Last 3 Encounters:  09/20/14 207 lb 1.9 oz (93.949 kg)  12/25/13 218 lb 6.4 oz (99.066 kg)  08/14/13 212 lb (96.163 kg)      ASSESSMENT AND PLAN:  1.  CAD  I am not convinced of any angina  2  S/p AVR  Plan of f/u echo in April  3.  PVCs  Montior was done in Fall 2015  3% PVCs  Pt asymptomatic  Continue b blocker  4.  HTN  Continue meds  BP is better  I  Cannot explain beyond wt.    5.  HL  Get lipids    Will get CBC, lipid, TSH, BNP  And BMEt    Signed, [name], MD  09/20/2014 4:22 PM    [address], [place], [place]  [address] Phone: [phone]; Fax: [phone]

## 2014-09-20 NOTE — Patient Instructions (Signed)
Medication Instructions:  Your physician recommends that you continue on your current medications as directed. Please refer to the Current Medication list given to you today.   Labwork: Please have the following labs drawn at [facility], Tsh, Cbc, Lipid, Ast, Bnp  Testing/Procedures: Your physician has requested that you have an echocardiogram. Echocardiography is a painless test that uses sound waves to create images of your heart. It provides your doctor with information about the size and shape of your heart and how well your heart's chambers and valves are working. This procedure takes approximately one hour. There are no restrictions for this procedure. (To be scheduled in April 2017)   Follow-Up: Your physician wants you to follow-up in: April 2017 You will receive a reminder letter in the mail two months in advance. If you don't receive a letter, please call our office to schedule the follow-up appointment.   Any Other Special Instructions Will Be Listed Below (If Applicable).

## 2014-09-24 ENCOUNTER — Other Ambulatory Visit (INDEPENDENT_AMBULATORY_CARE_PROVIDER_SITE_OTHER): Payer: Medicare Other

## 2014-09-24 DIAGNOSIS — I10 Essential (primary) hypertension: Secondary | ICD-10-CM | POA: Diagnosis not present

## 2014-09-24 DIAGNOSIS — E785 Hyperlipidemia, unspecified: Secondary | ICD-10-CM | POA: Diagnosis not present

## 2014-09-24 LAB — CBC WITH DIFFERENTIAL/PLATELET
BASOS PCT: 0.4 % (ref 0.0–3.0)
Basophils Absolute: 0 10*3/uL (ref 0.0–0.1)
EOS ABS: 0.5 10*3/uL (ref 0.0–0.7)
Eosinophils Relative: 5.2 % — ABNORMAL HIGH (ref 0.0–5.0)
HCT: 40.2 % (ref 39.0–52.0)
HEMOGLOBIN: 13.4 g/dL (ref 13.0–17.0)
LYMPHS PCT: 18.8 % (ref 12.0–46.0)
Lymphs Abs: 1.6 10*3/uL (ref 0.7–4.0)
MCHC: 33.4 g/dL (ref 30.0–36.0)
MCV: 91.5 fl (ref 78.0–100.0)
MONO ABS: 0.6 10*3/uL (ref 0.1–1.0)
Monocytes Relative: 6.7 % (ref 3.0–12.0)
NEUTROS PCT: 68.9 % (ref 43.0–77.0)
Neutro Abs: 6.1 10*3/uL (ref 1.4–7.7)
PLATELETS: 181 10*3/uL (ref 150.0–400.0)
RBC: 4.39 Mil/uL (ref 4.22–5.81)
RDW: 14.9 % (ref 11.5–15.5)
WBC: 8.8 10*3/uL (ref 4.0–10.5)

## 2014-09-24 LAB — BASIC METABOLIC PANEL
BUN: 21 mg/dL (ref 6–23)
CHLORIDE: 107 meq/L (ref 96–112)
CO2: 29 meq/L (ref 19–32)
CREATININE: 0.9 mg/dL (ref 0.40–1.50)
Calcium: 9 mg/dL (ref 8.4–10.5)
GFR: 87.81 mL/min (ref >60.00)
Glucose, Bld: 103 mg/dL — ABNORMAL HIGH (ref 70–99)
POTASSIUM: 4.7 meq/L (ref 3.5–5.1)
Sodium: 143 mEq/L (ref 135–145)

## 2014-09-24 LAB — TSH: TSH: 2.91 u[IU]/mL (ref 0.35–4.50)

## 2014-09-24 LAB — AST: AST: 18 U/L (ref 0–37)

## 2014-09-24 LAB — LIPID PANEL
Cholesterol: 129 mg/dL (ref 0–200)
HDL: 45.6 mg/dL (ref >39.00)
LDL CALC: 74 mg/dL (ref 0–99)
NonHDL: 83.36
Total CHOL/HDL Ratio: 3
Triglycerides: 48 mg/dL (ref 0.0–149.0)
VLDL: 9.6 mg/dL (ref 0.0–40.0)

## 2014-09-24 LAB — BRAIN NATRIURETIC PEPTIDE: Pro B Natriuretic peptide (BNP): 166 pg/mL — ABNORMAL HIGH (ref 0.0–100.0)

## 2014-10-08 ENCOUNTER — Telehealth: Payer: Self-pay

## 2014-10-08 NOTE — Telephone Encounter (Signed)
-----   Message from [name] V, MD sent at 10/07/2014  4:32 PM EDT ----- CBC normal Thyroid function normal  Electrolytes are normal   Lipids are excellent Fluid number is up minimaly Keep on same medicines

## 2014-10-08 NOTE — Telephone Encounter (Signed)
Pt aware of lab results with verbal understanding. CBC normal Thyroid function normal Electrolytes are normal  Lipids are excellent Fluid number is up minimaly Keep on same medicines

## 2014-11-26 ENCOUNTER — Other Ambulatory Visit: Payer: Self-pay | Admitting: Internal Medicine

## 2014-12-04 ENCOUNTER — Encounter: Payer: Self-pay | Admitting: *Deleted

## 2014-12-07 ENCOUNTER — Ambulatory Visit: Payer: Medicare Other

## 2014-12-13 ENCOUNTER — Ambulatory Visit (INDEPENDENT_AMBULATORY_CARE_PROVIDER_SITE_OTHER): Payer: Medicare Other

## 2014-12-13 DIAGNOSIS — Z23 Encounter for immunization: Secondary | ICD-10-CM

## 2014-12-26 ENCOUNTER — Other Ambulatory Visit: Payer: Self-pay | Admitting: Internal Medicine

## 2014-12-28 DIAGNOSIS — L720 Epidermal cyst: Secondary | ICD-10-CM | POA: Diagnosis not present

## 2014-12-28 DIAGNOSIS — L821 Other seborrheic keratosis: Secondary | ICD-10-CM | POA: Diagnosis not present

## 2014-12-28 DIAGNOSIS — L570 Actinic keratosis: Secondary | ICD-10-CM | POA: Diagnosis not present

## 2014-12-28 DIAGNOSIS — D1801 Hemangioma of skin and subcutaneous tissue: Secondary | ICD-10-CM | POA: Diagnosis not present

## 2015-01-16 DIAGNOSIS — D1801 Hemangioma of skin and subcutaneous tissue: Secondary | ICD-10-CM | POA: Diagnosis not present

## 2015-01-16 DIAGNOSIS — B360 Pityriasis versicolor: Secondary | ICD-10-CM | POA: Diagnosis not present

## 2015-03-08 ENCOUNTER — Telehealth: Payer: Self-pay | Admitting: Internal Medicine

## 2015-03-08 NOTE — Telephone Encounter (Signed)
[place] [facility] - Client [facility]  Patient Name: Luis Herrera  DOB: [birth year]    Initial Comment Caller states he has cough and congestion, wants to know what he can take while on blood pressure meds   Nurse Assessment      Guidelines    Guideline Title Affirmed Question Affirmed Notes       Final Disposition User   FINAL ATTEMPT MADE - message left Wynetta Emery, RN, [place] [facility]    Comments  NOTE; Nurse made 3 attempts final attempt advised if he needs further assistance please call back and have a blessed day.

## 2015-03-21 ENCOUNTER — Telehealth: Payer: Self-pay | Admitting: Internal Medicine

## 2015-03-21 NOTE — Telephone Encounter (Signed)
New Message:  Pt called in stating that since he has been switched to Olmesartan he has noticed that his BP has been elevated more that usual and he would like to know if this is due to the change in medication. Please f/u with him

## 2015-03-21 NOTE — Telephone Encounter (Signed)
F/u ° ° °Pt waiting on call from nurse.  °

## 2015-03-21 NOTE — Telephone Encounter (Signed)
Patient worried that his Benicar is generic (olmesartan) and his blood pressure is running high now 160s/80s. Patient denies any weight changes, no dietary changes that could be increasing his blood pressure. Taking all medications as ordered.  Advised will send a message to Dr. Harrington Challenger and will check with our pharmacist for further advice and will call him back. Ok to leave detailed voice message on his mobile #.  He will be flying to [facility] tomorrow.

## 2015-03-22 NOTE — Telephone Encounter (Signed)
Spoke with Gay Filler, PharmD. She states a generic Benicar should not be causing his BP to be higher. She recommends that he schedule an appointment with the HTN clinic with Gay Filler or [facility] and bring his home BP cuff with him.  I left detailed voice mail message for patient to call back when he gets back to schedule this appointment, and bring his cuff and a list of recent BP readings with him.

## 2015-03-26 ENCOUNTER — Telehealth: Payer: Self-pay | Admitting: Internal Medicine

## 2015-03-26 ENCOUNTER — Encounter: Payer: Self-pay | Admitting: Physician Assistant

## 2015-03-26 ENCOUNTER — Ambulatory Visit (INDEPENDENT_AMBULATORY_CARE_PROVIDER_SITE_OTHER): Payer: Medicare Other | Admitting: Physician Assistant

## 2015-03-26 VITALS — BP 180/68 | HR 84 | Ht 71.0 in | Wt 221.8 lb

## 2015-03-26 DIAGNOSIS — Z952 Presence of prosthetic heart valve: Secondary | ICD-10-CM

## 2015-03-26 DIAGNOSIS — E8770 Fluid overload, unspecified: Secondary | ICD-10-CM | POA: Diagnosis not present

## 2015-03-26 DIAGNOSIS — Z954 Presence of other heart-valve replacement: Secondary | ICD-10-CM | POA: Diagnosis not present

## 2015-03-26 DIAGNOSIS — I10 Essential (primary) hypertension: Secondary | ICD-10-CM

## 2015-03-26 MED ORDER — FUROSEMIDE 40 MG PO TABS: 40.0000 mg | ORAL_TABLET | Freq: Every day | ORAL | Status: DC

## 2015-03-26 MED ORDER — BENICAR 20 MG PO TABS: 20.0000 mg | ORAL_TABLET | Freq: Every day | ORAL | Status: DC

## 2015-03-26 NOTE — Progress Notes (Signed)
Cardiology Office Note   Date:  03/26/2015   ID:  Luis Herrera, DOB [birth year], MRN [id number]  PCP:  Scarlette Calico, MD  Cardiologist:  Dr Bonnell Public, Suanne Marker, PA-C   No chief complaint on file.   History of Present Illness: Luis Herrera is a 75 y.o. male with a history of HTN, HLD, AS w/ AVR & SVG-PDA 2008, prev followed at [facility], now followed by Dr Harrington Challenger. EF NL w/ trivial AI and valve OK by echo 12/2013.  BP has been elevated at times, and pt concerned, scheduled for appt. His labs were last checked in August 2016, a CBC, BMET and TSH were all normal, except for mildly elevated blood glucose  Luis Herrera presents for evaluation of hypertension. A couple of months ago, his Benicar was changed to the generic equivalent, no dose change. His other medications are unchanged. Ever since the Benicar changed to generic, he feels his blood pressure has been much harder to control. He has multiple readings with a systolic blood pressure greater than 160. He is concerned about this. He has not had chest pain. He denies new dyspnea on exertion, orthopnea or PND. He recently traveled to [place] and admits to dietary indiscretions with excess sodium. He is scheduled avoids sodium at home. He has had lower extremity edema, which she tends to get when he travels. His weight is up about 6 pounds. He does not wake up with lower extremity edema, but gets it during the day and it is more than usual since his trip.  He is concerned about his blood pressure. He feels that going back on the name brand Benicar will help.   Past Medical History  Diagnosis Date  . Hyperlipidemia   . Hypertension   . Hx of colonic polyps   . CAD (coronary artery disease) 2008  . Aortic stenosis 2008  . Blood transfusion   . Arthritis   . Complication of anesthesia     hallucinated once  . H/O hiatal hernia   . Blood transfusion without reported diagnosis   . Heart murmur     AFTER REPLACED VALVE WENT AWAY   Past  Surgical History  Procedure Laterality Date  . Aortic valve replacement  2009    Bioprosthetic  . Coronary artery bypass graft  2009    SVG-PDA w/ AVR  . Total knee arthroplasty  2000    left  . Cholecystectomy  1982  . Tonsillectomy    . Cardiac catheterization  2008  . Correction hammer toe  2011    BILATERAL  . Colonoscopy    . Gastrocnemius recession  05/28/2011  . Weil osteotomy  05/28/2011  . Total knee arthroplasty  12/01/2011    Procedure: TOTAL KNEE ARTHROPLASTY;  Surgeon: Mauri Pole, MD;  Location: [facility];  Service: Orthopedics;  Laterality: Right;  . Polypectomy    . Total hip arthroplasty Left 2000    Current Outpatient Prescriptions  Medication Sig Dispense Refill  . amoxicillin (AMOXIL) 500 MG capsule Take two grams prior to dental procedure 4 capsule 0  . aspirin 325 MG tablet Take 1 tablet (325 mg total) by mouth 2 (two) times daily. Will stop 11/26/11    . atorvastatin (LIPITOR) 80 MG tablet TAKE 1 TABLET (80 MG TOTAL) BY MOUTH DAILY. 90 tablet 1  . carvedilol (COREG) 6.25 MG tablet TAKE 1 TABLET (6.25 MG TOTAL) BY MOUTH 2 (TWO) TIMES DAILY. 180 tablet 3  . furosemide (LASIX) 40 MG tablet TAKE  1 TABLET (40 MG TOTAL) BY MOUTH DAILY. 90 tablet 1  . Multiple Vitamin (MULTIVITAMIN WITH MINERALS) TABS Take 1 tablet by mouth daily.    Marland Kitchen olmesartan (BENICAR) 20 MG tablet Take 1 tablet (20 mg total) by mouth daily. 90 tablet 3   No current facility-administered medications for this visit.    Allergies:   Sulfamethoxazole-trimethoprim    Social History:  The patient  reports that he quit smoking about 41 years ago. He has never used smokeless tobacco. He reports that he does not drink alcohol or use illicit drugs.   Family History:  The patient's family history includes Colon cancer in his mother; Heart attack in his brother and father; Hyperlipidemia in his other; Hypertension in his brother, other, and sister. There is no history of Stroke.    ROS:  Please see  the history of present illness. All other systems are reviewed and negative.    PHYSICAL EXAM: VS:  BP 180/68 mmHg  Pulse 84  Ht 5\' 11"  (1.803 m)  Wt 221 lb 12.8 oz (100.608 kg)  BMI 30.95 kg/m2 , BMI Body mass index is 30.95 kg/(m^2). GEN: Well nourished, well developed, male in no acute distress HEENT: normal for age  Neck: no JVD, no carotid bruit, no masses Cardiac: RRR; 2/6 murmur which radiates to the carotids, no rubs, or gallops Respiratory:  clear to auscultation bilaterally, normal work of breathing GI: soft, nontender, nondistended, + BS MS: no deformity or atrophy; 1+ bilateral lower extremity edema; distal pulses are 2+ in all 4 extremities  Skin: warm and dry, no rash Neuro:  Strength and sensation are intact Psych: euthymic mood, full affect   EKG:  EKG is not ordered today.  ECHO: 09/20/2014 - Left ventricle: The cavity size was normal. There was moderate concentric hypertrophy. Systolic function was vigorous. The estimated ejection fraction was in the range of 65% to 70%. Wall motion was normal; there were no regional wall motion abnormalities. Doppler parameters are consistent with abnormal left ventricular relaxation (grade 1 diastolic dysfunction). Doppler parameters are consistent with elevated ventricular end-diastolic filling pressure. - Aortic valve: A bioprosthetic valve sits well in the aortic position. There is no paravalvular leak and mild centrail aortic insufficiency. Transaortic gradients are peak 36 and mean 22 mmHg and are mildy elevated for this type of prosthesis. There was mild regurgitation. - Aortic root: The aortic root was normal in size. - Mitral valve: Calcified annulus. Mildly thickened leaflets . Transvalvular velocity was within the normal range. There was no evidence for stenosis. There was trivial regurgitation. Peak gradient (D): 7 mm Hg. - Left atrium: The atrium was mildly dilated. - Right  ventricle: The cavity size was mildly dilated. Wall thickness was normal. - Right atrium: The atrium was normal in size. - Tricuspid valve: There was mild regurgitation. - Pulmonic valve: There was no regurgitation. - Pulmonary arteries: Systolic pressure was within the normal range. - Inferior vena cava: The vessel was normal in size. - Pericardium, extracardiac: There was no pericardial effusion. Impressions: - There was moderate focal basal and mild concentric hypertrophy of the left ventricle.  Recent Labs: 09/24/2014: BUN 21; Creatinine, Ser 0.90; Hemoglobin 13.4; Platelets 181.0; Potassium 4.7; Pro B Natriuretic peptide (BNP) 166.0*; Sodium 143; TSH 2.91    Lipid Panel    Component Value Date/Time   CHOL 129 09/24/2014 1119   TRIG 48.0 09/24/2014 1119   HDL 45.60 09/24/2014 1119   CHOLHDL 3 09/24/2014 1119   VLDL 9.6 09/24/2014 1119  [place] 74 09/24/2014 1119     Wt Readings from Last 3 Encounters:  03/26/15 221 lb 12.8 oz (100.608 kg)  09/20/14 207 lb 1.9 oz (93.949 kg)  12/25/13 218 lb 6.4 oz (99.066 kg)     Other studies Reviewed: Additional studies/ records that were reviewed today include: Previous office notes and testing.  ASSESSMENT AND PLAN:  1.  S/p bioprosthetic AVR: Peak and mean gradients were 36/22 at his echocardiogram 09/2014. Follow up with Dr. Harrington Challenger  2. Volume overload: He admits to dietary indiscretions with excess sodium when traveling. He has lower extremity edema that is worse than usual and he has gained about 6 pounds. He is also having problems with hypertension. We will increase his Lasix prescriptions such that he can take an extra Lasix tablet in the afternoons as needed for edema. We will check a BMET today and add potassium if his potassium level is low or borderline.  3. Hypertension: The patient believes that his blood pressure has been running much higher since the Benicar was changed to generic. There was no dose change and no  other medication changes. Upon review of the blood pressures that he has recorded, his blood pressure has been significantly elevated. We will give him a prescription for the name brand Benicar and he can take it to his pharmacy to see the cost of getting it filled. He may also have a higher blood pressure because of volume overload. He says his home weight ranges between 207 and 216 pounds. This is a wide range and he probably has periodic volume overload. Increase the Lasix but cannot increase the carvedilol because of recorded heart rates in the 50s on a regular basis. If changing the Benicar to name brand does not control his blood pressure, we can increase the dose.   Current medicines are reviewed at length with the patient today.  The patient has concerns regarding medicines. Concerns were addressed  The following changes have been made:  Change Benicar to name brand, add when necessary Lasix to the daily dose  Labs/ tests ordered today include:   Orders Placed This Encounter  Procedures  . Basic metabolic panel     Disposition:   FU with Dr. Harrington Challenger  Signed, Lenoard Aden  03/26/2015 3:26 PM    [address], [place], [place]  [address] Phone: [phone]; Fax: [phone]

## 2015-03-26 NOTE — Telephone Encounter (Signed)
Left message for the patient to call back.

## 2015-03-26 NOTE — Telephone Encounter (Signed)
New Message  Pt c/o BP issue:  1. No readings  2. Are you having any other symptoms (ex. Dizziness, headache, blurred vision, passed out)?  Light headed 3. What is your medication issue? Pt states that he spoke with the nurse about his BP earier. Would like to go over that again. He has new information. Would rather disclose with nurse

## 2015-03-26 NOTE — Telephone Encounter (Signed)
Patient called back, he is worried about waiting until Monday to see the HTN Clinic Pharmacist.; states BP continues to run high systolic in [id number] today.  Discussed with Rosaria Ferries, APP, added to her schedule today at 2pm.  Instructed patient to bring his home BP cuff. He is appreciative for the call and follow up.

## 2015-03-26 NOTE — Patient Instructions (Signed)
Medication Instructions:  Your physician has recommended you make the following change in your medication:  1) INCREASE Lasix to 40 mg by mouth ONCE daily and take additional 40 mg tablet by mouth AS NEED for swelling and weight gain   Labwork: Your physician recommends that you return for lab work: TODAY (BMET)   Testing/Procedures: none  Follow-Up: Your physician recommends that you schedule a follow-up appointment in: [facility] in 4 weeks from medication changes - Jimmy Footman, PharmD  Keep scheduled follow-up appointment with Dr. Harrington Challenger in April.  Any Other Special Instructions Will Be Listed Below (If Applicable).  Rhonda Barrett PA-C would like you to check your blood pressure DAILY, various times of day, for the next 4 weeks.  Keep a journal of these daily BP and heart rate reading and call our office with the results.  Check weight daily and keep a log.  Any changes GREATER than 3 pounds over night or 5 pounds in a week give our office a call.   Low-Sodium Eating Plan Sodium raises blood pressure and causes water to be held in the body. Getting less sodium from food will help lower your blood pressure, reduce any swelling, and protect your heart, liver, and kidneys. We get sodium by adding salt (sodium chloride) to food. Most of our sodium comes from canned, boxed, and frozen foods. Restaurant foods, fast foods, and pizza are also very high in sodium. Even if you take medicine to lower your blood pressure or to reduce fluid in your body, getting less sodium from your food is important. WHAT IS MY PLAN? Most people should limit their sodium intake to 2,300 mg a day. Your health care provider recommends that you limit your sodium intake to 2000 grams a day.  WHAT DO I NEED TO KNOW ABOUT THIS EATING PLAN? For the low-sodium eating plan, you will follow these general guidelines:  Choose foods with a % Daily Value for sodium of less than 5% (as listed on the food label).    Use salt-free seasonings or herbs instead of table salt or sea salt.   Check with your health care provider or pharmacist before using salt substitutes.   Eat fresh foods.  Eat more vegetables and fruits.  Limit canned vegetables. If you do use them, rinse them well to decrease the sodium.   Limit cheese to 1 oz (28 g) per day.   Eat lower-sodium products, often labeled as "lower sodium" or "no salt added."  Avoid foods that contain monosodium glutamate (MSG). MSG is sometimes added to [place] food and some canned foods.  Check food labels (Nutrition Facts labels) on foods to learn how much sodium is in one serving.  Eat more home-cooked food and less restaurant, buffet, and fast food.  When eating at a restaurant, ask that your food be prepared with less salt, or no salt if possible.  HOW DO I READ FOOD LABELS FOR SODIUM INFORMATION? The Nutrition Facts label lists the amount of sodium in one serving of the food. If you eat more than one serving, you must multiply the listed amount of sodium by the number of servings. Food labels may also identify foods as:  Sodium free--Less than 5 mg in a serving.  Very low sodium--35 mg or less in a serving.  Low sodium--140 mg or less in a serving.  Light in sodium--50% less sodium in a serving. For example, if a food that usually has 300 mg of sodium is changed to become  light in sodium, it will have 150 mg of sodium.  Reduced sodium--25% less sodium in a serving. For example, if a food that usually has 400 mg of sodium is changed to reduced sodium, it will have 300 mg of sodium. WHAT FOODS CAN I EAT? Grains Low-sodium cereals, including oats, puffed wheat and rice, and shredded wheat cereals. Low-sodium crackers. Unsalted rice and pasta. Lower-sodium bread.  Vegetables Frozen or fresh vegetables. Low-sodium or reduced-sodium canned vegetables. Low-sodium or reduced-sodium tomato sauce and paste. Low-sodium or  reduced-sodium tomato and vegetable juices.  Fruits Fresh, frozen, and canned fruit. Fruit juice.  Meat and Other Protein Products Low-sodium canned tuna and salmon. Fresh or frozen meat, poultry, seafood, and fish. Lamb. Unsalted nuts. Dried beans, peas, and lentils without added salt. Unsalted canned beans. Homemade soups without salt. Eggs.  Dairy Milk. Soy milk. Ricotta cheese. Low-sodium or reduced-sodium cheeses. Yogurt.  Condiments Fresh and dried herbs and spices. Salt-free seasonings. Onion and garlic powders. Low-sodium varieties of mustard and ketchup. Fresh or refrigerated horseradish. Lemon juice.  Fats and Oils Reduced-sodium salad dressings. Unsalted butter.  Other Unsalted popcorn and pretzels.  The items listed above may not be a complete list of recommended foods or beverages. Contact your dietitian for more options. WHAT FOODS ARE NOT RECOMMENDED? Grains Instant hot cereals. Bread stuffing, pancake, and biscuit mixes. Croutons. Seasoned rice or pasta mixes. Noodle soup cups. Boxed or frozen macaroni and cheese. Self-rising flour. Regular salted crackers. Vegetables Regular canned vegetables. Regular canned tomato sauce and paste. Regular tomato and vegetable juices. Frozen vegetables in sauces. Salted [place] fries. Olives. Angie Fava. Relishes. Sauerkraut. Salsa. Meat and Other Protein Products Salted, canned, smoked, spiced, or pickled meats, seafood, or fish. Bacon, ham, sausage, hot dogs, corned beef, chipped beef, and packaged luncheon meats. Salt pork. Jerky. Pickled herring. Anchovies, regular canned tuna, and sardines. Salted nuts. Dairy Processed cheese and cheese spreads. Cheese curds. Blue cheese and cottage cheese. Buttermilk.  Condiments Onion and garlic salt, seasoned salt, table salt, and sea salt. Canned and packaged gravies. Worcestershire sauce. Tartar sauce. Barbecue sauce. Teriyaki sauce. Soy sauce, including reduced sodium. Steak sauce. Fish  sauce. Oyster sauce. Cocktail sauce. Horseradish that you find on the shelf. Regular ketchup and mustard. Meat flavorings and tenderizers. Bouillon cubes. Hot sauce. Tabasco sauce. Marinades. Taco seasonings. Relishes. Fats and Oils Regular salad dressings. Salted butter. Margarine. Ghee. Bacon fat.  Other Potato and tortilla chips. Corn chips and puffs. Salted popcorn and pretzels. Canned or dried soups. Pizza. Frozen entrees and pot pies.  The items listed above may not be a complete list of foods and beverages to avoid. Contact your dietitian for more information.   This information is not intended to replace advice given to you by your health care provider. Make sure you discuss any questions you have with your health care provider.   Document Released: 07/25/2001 Document Revised: 02/23/2014 Document Reviewed: 12/07/2012 Elsevier Interactive Patient Education [employer].    If you need a refill on your cardiac medications before your next appointment, please call your pharmacy.

## 2015-03-27 LAB — BASIC METABOLIC PANEL
BUN: 20 mg/dL (ref 7–25)
CO2: 29 mmol/L (ref 20–31)
CREATININE: 0.82 mg/dL (ref 0.70–1.18)
Calcium: 8.5 mg/dL — ABNORMAL LOW (ref 8.6–10.3)
Chloride: 103 mmol/L (ref 98–110)
GLUCOSE: 78 mg/dL (ref 65–99)
POTASSIUM: 4.4 mmol/L (ref 3.5–5.3)
Sodium: 141 mmol/L (ref 135–146)

## 2015-04-01 ENCOUNTER — Ambulatory Visit: Payer: Medicare Other | Admitting: Pharmacist

## 2015-04-15 DIAGNOSIS — M216X1 Other acquired deformities of right foot: Secondary | ICD-10-CM | POA: Diagnosis not present

## 2015-04-15 DIAGNOSIS — D2371 Other benign neoplasm of skin of right lower limb, including hip: Secondary | ICD-10-CM | POA: Diagnosis not present

## 2015-04-15 DIAGNOSIS — D2372 Other benign neoplasm of skin of left lower limb, including hip: Secondary | ICD-10-CM | POA: Diagnosis not present

## 2015-04-15 DIAGNOSIS — M216X2 Other acquired deformities of left foot: Secondary | ICD-10-CM | POA: Diagnosis not present

## 2015-04-15 DIAGNOSIS — M21622 Bunionette of left foot: Secondary | ICD-10-CM | POA: Diagnosis not present

## 2015-04-26 DIAGNOSIS — L821 Other seborrheic keratosis: Secondary | ICD-10-CM | POA: Diagnosis not present

## 2015-04-26 DIAGNOSIS — L720 Epidermal cyst: Secondary | ICD-10-CM | POA: Diagnosis not present

## 2015-04-26 DIAGNOSIS — L570 Actinic keratosis: Secondary | ICD-10-CM | POA: Diagnosis not present

## 2015-04-26 DIAGNOSIS — D1801 Hemangioma of skin and subcutaneous tissue: Secondary | ICD-10-CM | POA: Diagnosis not present

## 2015-05-14 ENCOUNTER — Other Ambulatory Visit: Payer: Self-pay | Admitting: Internal Medicine

## 2015-05-23 ENCOUNTER — Other Ambulatory Visit (HOSPITAL_COMMUNITY): Payer: Medicare Other

## 2015-05-23 DIAGNOSIS — D2371 Other benign neoplasm of skin of right lower limb, including hip: Secondary | ICD-10-CM | POA: Diagnosis not present

## 2015-05-23 DIAGNOSIS — D2372 Other benign neoplasm of skin of left lower limb, including hip: Secondary | ICD-10-CM | POA: Diagnosis not present

## 2015-06-07 ENCOUNTER — Other Ambulatory Visit: Payer: Self-pay

## 2015-06-07 ENCOUNTER — Ambulatory Visit (INDEPENDENT_AMBULATORY_CARE_PROVIDER_SITE_OTHER): Payer: Medicare Other | Admitting: Internal Medicine

## 2015-06-07 ENCOUNTER — Ambulatory Visit (HOSPITAL_COMMUNITY): Payer: Medicare Other | Attending: Cardiology

## 2015-06-07 ENCOUNTER — Encounter: Payer: Self-pay | Admitting: Internal Medicine

## 2015-06-07 VITALS — BP 150/84 | HR 68 | Ht 71.0 in | Wt 212.1 lb

## 2015-06-07 DIAGNOSIS — I119 Hypertensive heart disease without heart failure: Secondary | ICD-10-CM | POA: Insufficient documentation

## 2015-06-07 DIAGNOSIS — I351 Nonrheumatic aortic (valve) insufficiency: Secondary | ICD-10-CM | POA: Insufficient documentation

## 2015-06-07 DIAGNOSIS — I2510 Atherosclerotic heart disease of native coronary artery without angina pectoris: Secondary | ICD-10-CM | POA: Insufficient documentation

## 2015-06-07 DIAGNOSIS — Z951 Presence of aortocoronary bypass graft: Secondary | ICD-10-CM | POA: Diagnosis not present

## 2015-06-07 DIAGNOSIS — Z954 Presence of other heart-valve replacement: Secondary | ICD-10-CM

## 2015-06-07 DIAGNOSIS — R252 Cramp and spasm: Secondary | ICD-10-CM | POA: Diagnosis not present

## 2015-06-07 DIAGNOSIS — E785 Hyperlipidemia, unspecified: Secondary | ICD-10-CM | POA: Diagnosis not present

## 2015-06-07 DIAGNOSIS — I359 Nonrheumatic aortic valve disorder, unspecified: Secondary | ICD-10-CM | POA: Diagnosis present

## 2015-06-07 DIAGNOSIS — Z953 Presence of xenogenic heart valve: Secondary | ICD-10-CM | POA: Insufficient documentation

## 2015-06-07 DIAGNOSIS — Z952 Presence of prosthetic heart valve: Secondary | ICD-10-CM

## 2015-06-07 LAB — BASIC METABOLIC PANEL
BUN: 19 mg/dL (ref 7–25)
CHLORIDE: 103 mmol/L (ref 98–110)
CO2: 29 mmol/L (ref 20–31)
CREATININE: 0.9 mg/dL (ref 0.70–1.18)
Calcium: 8.8 mg/dL (ref 8.6–10.3)
GLUCOSE: 77 mg/dL (ref 65–99)
Potassium: 3.9 mmol/L (ref 3.5–5.3)
Sodium: 143 mmol/L (ref 135–146)

## 2015-06-07 LAB — MAGNESIUM: Magnesium: 1.8 mg/dL (ref 1.5–2.5)

## 2015-06-07 MED ORDER — AMLODIPINE-OLMESARTAN 5-40 MG PO TABS: 0.5000 | ORAL_TABLET | Freq: Every day | ORAL | Status: DC

## 2015-06-07 MED ORDER — CARVEDILOL 3.125 MG PO TABS: 3.1250 mg | ORAL_TABLET | Freq: Two times a day (BID) | ORAL | Status: DC

## 2015-06-07 NOTE — Patient Instructions (Signed)
Your physician has recommended you make the following change in your medication:  1.) stop Benicar 2.) decrease carvedilol (Coreg) to 3.125 mg two times a day 3.) start AZOR 5/40mg ----take 1/2 tablet (2.5/20mg ) once daily  Your physician recommends that you return for lab work in: today (BMET, [place])  Record your blood pressure and send throught Patient Portal via [place].

## 2015-06-07 NOTE — Progress Notes (Signed)
Cardiology Office Note   Date:  06/07/2015   ID:  Luis Herrera, DOB 03-20-1940, MRN [id number]  PCP:  Luis Calico, MD  Cardiologist:   [name], MD   F/U of AV dz and HTN      History of Present Illness: Luis Herrera is a 76 y.o. male with a history of AV disease (s/p AVR), CAD, HTN.He was  previously followed by Luis Herrera at [facility]. Pt also had a hsitory of PVCs  With this recomm Normal LV function Moderate LVH AV bioprosthesis moved well Mean gradient 22 mm Hg  CT of chest showed no PE   I saw the pt in Aug of last year    He was seen by Luis Herrera in Feb  BP had been up  No CP  Though BP was up when placed on generic bneiar  Wt was up at 216  Recomm increasing lasix    Since seen his breathing is good  No CP  BP has been up    Outpatient Prescriptions Prior to Visit  Medication Sig Dispense Refill  . amoxicillin (AMOXIL) 500 MG capsule Take two grams prior to dental procedure 4 capsule 0  . aspirin 325 MG tablet Take 1 tablet (325 mg total) by mouth 2 (two) times daily. Will stop 11/26/11    . atorvastatin (LIPITOR) 80 MG tablet TAKE 1 TABLET (80 MG TOTAL) BY MOUTH DAILY. 90 tablet 1  . BENICAR 20 MG tablet Take 1 tablet (20 mg total) by mouth daily. 90 tablet 3  . carvedilol (COREG) 6.25 MG tablet TAKE 1 TABLET (6.25 MG TOTAL) BY MOUTH 2 (TWO) TIMES DAILY. 180 tablet 3  . carvedilol (COREG) 6.25 MG tablet TAKE 1 TABLET (6.25 MG TOTAL) BY MOUTH 2 (TWO) TIMES DAILY. 180 tablet 2  . furosemide (LASIX) 40 MG tablet Take 1 tablet (40 mg total) by mouth daily. Take additional 40 mg as needed for swelling and/or weight 180 tablet 2  . Multiple Vitamin (MULTIVITAMIN WITH MINERALS) TABS Take 1 tablet by mouth daily.     No facility-administered medications prior to visit.     Allergies:   Sulfamethoxazole-trimethoprim   Past Medical History  Diagnosis Date  . Hyperlipidemia   . Hypertension   . Hx of colonic polyps   . CAD (coronary artery disease) 2008  .  Aortic stenosis 2008  . Blood transfusion   . Arthritis   . Complication of anesthesia     hallucinated once  . H/O hiatal hernia   . Blood transfusion without reported diagnosis   . Heart murmur     AFTER REPLACED VALVE WENT AWAY    Past Surgical History  Procedure Laterality Date  . Aortic valve replacement  2009    Bioprosthetic at [facility]  . Coronary artery bypass graft  2009    SVG-PDA w/ AVR  . Total knee arthroplasty  2000    left  . Cholecystectomy  1982  . Tonsillectomy    . Cardiac catheterization  2008  . Correction hammer toe  2011    BILATERAL  . Colonoscopy    . Gastrocnemius recession  05/28/2011  . Weil osteotomy  05/28/2011  . Total knee arthroplasty  12/01/2011    Procedure: TOTAL KNEE ARTHROPLASTY;  Surgeon: Mauri Pole, MD;  Location: [facility];  Service: Orthopedics;  Laterality: Right;  . Polypectomy    . Total hip arthroplasty Left 2000     Social History:  The patient  reports that he quit smoking about 42 years ago. He has never used smokeless tobacco. He reports that he does not drink alcohol or use illicit drugs.   Family History:  The patient's family history includes Colon cancer in his mother; Heart attack in his brother and father; Hyperlipidemia in his other; Hypertension in his brother, other, and sister. There is no history of Stroke.    ROS:  Please see the history of present illness. All other systems are reviewed and  Negative to the above problem except as noted.    PHYSICAL EXAM: VS:  BP 150/84 mmHg  Pulse 68  Ht 5\' 11"  (1.803 m)  Wt 212 lb 1.9 oz (96.217 kg)  BMI 29.60 kg/m2  SpO2 98%   On my check BP 180/  Pt caome back after lunch was 140/ GEN: Well nourished, well developed, in no acute distress HEENT: normal Neck: no JVD, carotid bruits, or masses Cardiac: RRR; no murmurs, rubs, or gallops,no edema  Respiratory:  clear to auscultation bilaterally, normal work of breathing GI: soft,  nontender, nondistended, + BS  No hepatomegaly  MS: no deformity Moving all extremities   Skin: warm and dry, no rash Neuro:  Strength and sensation are intact Psych: euthymic mood, full affect   EKG:  EKG is not ordered today.   Lipid Panel    Component Value Date/Time   CHOL 129 09/24/2014 1119   TRIG 48.0 09/24/2014 1119   HDL 45.60 09/24/2014 1119   CHOLHDL 3 09/24/2014 1119   VLDL 9.6 09/24/2014 1119   LDLCALC 74 09/24/2014 1119      Wt Readings from Last 3 Encounters:  06/07/15 212 lb 1.9 oz (96.217 kg)  03/26/15 221 lb 12.8 oz (100.608 kg)  09/20/14 207 lb 1.9 oz (93.949 kg)      ASSESSMENT AND PLAN:  1  AV dz  Echo shows valve is functioning well    2  HTN  BP is not well controlled  I have given him samples of Azor 5/40  Recomm that he cut in 1/2 and take  Call back with BP readings  Continue other meds   Except decrease coreg to 3/125   Will check BMET     Signed, [name], MD  06/07/2015 10:50 AM    [address], [place], [place]  [address] Phone: [phone]; Fax: [phone]

## 2015-06-08 ENCOUNTER — Other Ambulatory Visit: Payer: Self-pay | Admitting: Internal Medicine

## 2015-06-10 ENCOUNTER — Encounter: Payer: Self-pay | Admitting: Internal Medicine

## 2015-07-04 DIAGNOSIS — D2372 Other benign neoplasm of skin of left lower limb, including hip: Secondary | ICD-10-CM | POA: Diagnosis not present

## 2015-07-04 DIAGNOSIS — D2371 Other benign neoplasm of skin of right lower limb, including hip: Secondary | ICD-10-CM | POA: Diagnosis not present

## 2015-07-18 ENCOUNTER — Telehealth: Payer: Self-pay | Admitting: Internal Medicine

## 2015-07-18 ENCOUNTER — Other Ambulatory Visit: Payer: Self-pay | Admitting: *Deleted

## 2015-07-18 MED ORDER — AMLODIPINE-OLMESARTAN 5-40 MG PO TABS: 0.5000 | ORAL_TABLET | Freq: Every day | ORAL | Status: DC

## 2015-07-18 NOTE — Telephone Encounter (Signed)
°*  STAT* If patient is at the pharmacy, call can be transferred to refill team.   1. Which medications need to be refilled? (please list name of each medication and dose if known)? Amlodipine-Olmesartan 5/40mg    2. Which pharmacy/location (including street and city if local pharmacy) is [facility] [address]  3. Do they need a 30 day or 90 day supply? [place]

## 2015-07-25 DIAGNOSIS — H40003 Preglaucoma, unspecified, bilateral: Secondary | ICD-10-CM | POA: Diagnosis not present

## 2015-07-25 DIAGNOSIS — H353131 Nonexudative age-related macular degeneration, bilateral, early dry stage: Secondary | ICD-10-CM | POA: Diagnosis not present

## 2015-07-25 DIAGNOSIS — H2513 Age-related nuclear cataract, bilateral: Secondary | ICD-10-CM | POA: Diagnosis not present

## 2015-07-25 DIAGNOSIS — H524 Presbyopia: Secondary | ICD-10-CM | POA: Diagnosis not present

## 2015-08-27 DIAGNOSIS — H18411 Arcus senilis, right eye: Secondary | ICD-10-CM | POA: Diagnosis not present

## 2015-08-27 DIAGNOSIS — H2511 Age-related nuclear cataract, right eye: Secondary | ICD-10-CM | POA: Diagnosis not present

## 2015-08-27 DIAGNOSIS — H353130 Nonexudative age-related macular degeneration, bilateral, stage unspecified: Secondary | ICD-10-CM | POA: Diagnosis not present

## 2015-08-27 DIAGNOSIS — H2512 Age-related nuclear cataract, left eye: Secondary | ICD-10-CM | POA: Diagnosis not present

## 2015-08-27 DIAGNOSIS — H18412 Arcus senilis, left eye: Secondary | ICD-10-CM | POA: Diagnosis not present

## 2015-08-27 DIAGNOSIS — H02839 Dermatochalasis of unspecified eye, unspecified eyelid: Secondary | ICD-10-CM | POA: Diagnosis not present

## 2015-08-28 DIAGNOSIS — M21622 Bunionette of left foot: Secondary | ICD-10-CM | POA: Diagnosis not present

## 2015-08-28 DIAGNOSIS — M216X1 Other acquired deformities of right foot: Secondary | ICD-10-CM | POA: Diagnosis not present

## 2015-08-28 DIAGNOSIS — D2372 Other benign neoplasm of skin of left lower limb, including hip: Secondary | ICD-10-CM | POA: Diagnosis not present

## 2015-08-28 DIAGNOSIS — M216X2 Other acquired deformities of left foot: Secondary | ICD-10-CM | POA: Diagnosis not present

## 2015-08-28 DIAGNOSIS — D2371 Other benign neoplasm of skin of right lower limb, including hip: Secondary | ICD-10-CM | POA: Diagnosis not present

## 2015-09-16 ENCOUNTER — Telehealth: Payer: Self-pay | Admitting: *Deleted

## 2015-09-16 NOTE — Telephone Encounter (Signed)
Received fax from [place] [facility] [place] for patient. He is is scheduled for proce

## 2015-09-16 NOTE — Telephone Encounter (Signed)
Received by fax a request for clearance be sent to [phone].  Scheduled for 10/07/15 and 10/28/15--cataract extraction w/ intraocular lens implantation of left eye followed by right eye.    Pt does not need to stop any medication.  Topical anesthesia only.  Needs:  Current Diagnosis and Current medication list and  Medically stable to proceed with eye procedure?  Will forward to Dr. Harrington Challenger to document clearance/any concerns. Will send with last OV note once MD replies.

## 2015-09-26 ENCOUNTER — Other Ambulatory Visit: Payer: Self-pay | Admitting: Physician Assistant

## 2015-09-29 NOTE — Telephone Encounter (Signed)
Pt is OK to proceed with cataract surgery  No need to hold any medicines. Low risk for cardiac event

## 2015-09-30 ENCOUNTER — Telehealth: Payer: Self-pay | Admitting: Internal Medicine

## 2015-09-30 NOTE — Telephone Encounter (Signed)
Left detailed message that Dr. Harrington Challenger was away and that she has replied to request for clearance. Advised to expect documentation to come via fax and call back if any questions.

## 2015-09-30 NOTE — Telephone Encounter (Signed)
Printed encounter and last OV note and placed in nurse fax bin in medical records to be faxed.

## 2015-09-30 NOTE — Telephone Encounter (Signed)
New message        Request for surgical clearance:  1. What type of surgery is being performed? cataract  When is this surgery scheduled 10-04-15 2. Are there any medications that need to be held prior to surgery and how long? Medical clearance only  Name of physician performing surgery?  Dr Talbert Forest 3. What is your office phone and fax number?  Fax [phone]

## 2015-10-01 ENCOUNTER — Telehealth: Payer: Self-pay | Admitting: Internal Medicine

## 2015-10-01 MED ORDER — AMLODIPINE-OLMESARTAN 5-40 MG PO TABS: 0.5000 | ORAL_TABLET | Freq: Every day | ORAL | 3 refills | Status: DC

## 2015-10-01 NOTE — Telephone Encounter (Signed)
New message       Pt would like the nurse to call him concerning getting an increase of his med to 90 days. Please call.    *STAT* If patient is at the pharmacy, call can be transferred to refill team.   1. Which medications need to be refilled? (please list name of each medication and dose if known) amlodipine   2. Which pharmacy/location (including street and city if local pharmacy) is [facility] [address]  3. Do they need a 30 day or 90 day supply? [place]

## 2015-10-01 NOTE — Telephone Encounter (Signed)
Returned patient's call.  Left voice mail that I sent 90 day supply of AZOR to pharmacy per today's request and to call back if any further concerns/questions.

## 2015-10-01 NOTE — Telephone Encounter (Signed)
[facility]

## 2015-10-01 NOTE — Telephone Encounter (Signed)
Per pt returning this office  cAll.

## 2015-10-07 DIAGNOSIS — H2512 Age-related nuclear cataract, left eye: Secondary | ICD-10-CM | POA: Diagnosis not present

## 2015-10-08 DIAGNOSIS — H2511 Age-related nuclear cataract, right eye: Secondary | ICD-10-CM | POA: Diagnosis not present

## 2015-10-16 DIAGNOSIS — M21622 Bunionette of left foot: Secondary | ICD-10-CM | POA: Diagnosis not present

## 2015-10-16 DIAGNOSIS — D2371 Other benign neoplasm of skin of right lower limb, including hip: Secondary | ICD-10-CM | POA: Diagnosis not present

## 2015-10-16 DIAGNOSIS — M216X2 Other acquired deformities of left foot: Secondary | ICD-10-CM | POA: Diagnosis not present

## 2015-10-16 DIAGNOSIS — D2372 Other benign neoplasm of skin of left lower limb, including hip: Secondary | ICD-10-CM | POA: Diagnosis not present

## 2015-10-16 DIAGNOSIS — M216X1 Other acquired deformities of right foot: Secondary | ICD-10-CM | POA: Diagnosis not present

## 2015-10-17 ENCOUNTER — Telehealth: Payer: Self-pay | Admitting: Internal Medicine

## 2015-10-17 NOTE — Telephone Encounter (Signed)
New message    Pt verbalized that he wants to speak to Dr.Ross rn and that he did not want to disclose any information to me  I asked him did he have any CP or SOB pt replied with "Just have her call me."

## 2015-10-17 NOTE — Telephone Encounter (Signed)
Patient wanted to ask Dr. Alan Ripper opinion for a PCP referral.  Gave recommendation.

## 2015-10-28 DIAGNOSIS — H2511 Age-related nuclear cataract, right eye: Secondary | ICD-10-CM | POA: Diagnosis not present

## 2015-10-31 ENCOUNTER — Encounter: Payer: Medicare Other | Admitting: Internal Medicine

## 2015-11-14 ENCOUNTER — Ambulatory Visit (INDEPENDENT_AMBULATORY_CARE_PROVIDER_SITE_OTHER): Payer: Medicare Other | Admitting: Internal Medicine

## 2015-11-14 ENCOUNTER — Encounter: Payer: Self-pay | Admitting: Internal Medicine

## 2015-11-14 ENCOUNTER — Other Ambulatory Visit (INDEPENDENT_AMBULATORY_CARE_PROVIDER_SITE_OTHER): Payer: Medicare Other

## 2015-11-14 VITALS — BP 144/76 | HR 67 | Temp 98.1°F | Resp 16 | Ht 71.0 in | Wt 212.0 lb

## 2015-11-14 DIAGNOSIS — E785 Hyperlipidemia, unspecified: Secondary | ICD-10-CM

## 2015-11-14 DIAGNOSIS — I10 Essential (primary) hypertension: Secondary | ICD-10-CM | POA: Diagnosis not present

## 2015-11-14 DIAGNOSIS — Z23 Encounter for immunization: Secondary | ICD-10-CM

## 2015-11-14 DIAGNOSIS — E663 Overweight: Secondary | ICD-10-CM

## 2015-11-14 LAB — CBC WITH DIFFERENTIAL/PLATELET
BASOS PCT: 0.4 % (ref 0.0–3.0)
Basophils Absolute: 0 10*3/uL (ref 0.0–0.1)
EOS PCT: 5 % (ref 0.0–5.0)
Eosinophils Absolute: 0.4 10*3/uL (ref 0.0–0.7)
HEMATOCRIT: 42.3 % (ref 39.0–52.0)
HEMOGLOBIN: 14.4 g/dL (ref 13.0–17.0)
LYMPHS PCT: 23.1 % (ref 12.0–46.0)
Lymphs Abs: 1.9 10*3/uL (ref 0.7–4.0)
MCHC: 34 g/dL (ref 30.0–36.0)
MCV: 90.5 fl (ref 78.0–100.0)
MONOS PCT: 6.9 % (ref 3.0–12.0)
Monocytes Absolute: 0.6 10*3/uL (ref 0.1–1.0)
Neutro Abs: 5.2 10*3/uL (ref 1.4–7.7)
Neutrophils Relative %: 64.6 % (ref 43.0–77.0)
Platelets: 202 10*3/uL (ref 150.0–400.0)
RBC: 4.67 Mil/uL (ref 4.22–5.81)
RDW: 14.6 % (ref 11.5–15.5)
WBC: 8.1 10*3/uL (ref 4.0–10.5)

## 2015-11-14 LAB — LIPID PANEL
CHOLESTEROL: 154 mg/dL (ref 0–200)
HDL: 48.9 mg/dL (ref >39.00)
LDL Cholesterol: 89 mg/dL (ref 0–99)
NonHDL: 105.59
Total CHOL/HDL Ratio: 3
Triglycerides: 84 mg/dL (ref 0.0–149.0)
VLDL: 16.8 mg/dL (ref 0.0–40.0)

## 2015-11-14 LAB — COMPREHENSIVE METABOLIC PANEL
ALK PHOS: 86 U/L (ref 39–117)
ALT: 20 U/L (ref 0–53)
AST: 21 U/L (ref 0–37)
Albumin: 4.2 g/dL (ref 3.5–5.2)
BILIRUBIN TOTAL: 1.2 mg/dL (ref 0.2–1.2)
BUN: 23 mg/dL (ref 6–23)
CO2: 32 mEq/L (ref 19–32)
Calcium: 9.2 mg/dL (ref 8.4–10.5)
Chloride: 102 mEq/L (ref 96–112)
Creatinine, Ser: 0.91 mg/dL (ref 0.40–1.50)
GFR: 86.43 mL/min (ref >60.00)
GLUCOSE: 99 mg/dL (ref 70–99)
Potassium: 4.7 mEq/L (ref 3.5–5.1)
SODIUM: 141 meq/L (ref 135–145)
TOTAL PROTEIN: 7.7 g/dL (ref 6.0–8.3)

## 2015-11-14 LAB — TSH: TSH: 2.51 u[IU]/mL (ref 0.35–4.50)

## 2015-11-14 MED ORDER — ZOSTER VACCINE LIVE 19400 UNT/0.65ML ~~LOC~~ SUSR: 0.6500 mL | Freq: Once | SUBCUTANEOUS | 0 refills | Status: AC

## 2015-11-14 NOTE — Progress Notes (Signed)
Pre visit review using our clinic review tool, if applicable. No additional management support is needed unless otherwise documented below in the visit note. 

## 2015-11-14 NOTE — Progress Notes (Signed)
Subjective:  Patient ID: Luis Herrera, male    DOB: 1940-05-11  Age: 75 y.o. MRN: [id number]  CC: Hypertension and Hyperlipidemia   HPI Luis Herrera presents for follow-up on hypertension and high cholesterol. He has had a rare episode of lightheadedness but for the most part tells me he has felt well. His blood pressure has been well controlled on the combination of amlodipine, olmesartan, carvedilol, and furosemide. He denies headache/chest pain/shortness of breath/palpitations/edema/fatigue.  Outpatient Medications Prior to Visit  Medication Sig Dispense Refill  . amLODipine-olmesartan (AZOR) 5-40 MG tablet Take 0.5 tablets by mouth daily. 45 tablet 3  . aspirin 325 MG tablet Take 1 tablet (325 mg total) by mouth 2 (two) times daily. Will stop 11/26/11    . atorvastatin (LIPITOR) 80 MG tablet TAKE 1 TABLET (80 MG TOTAL) BY MOUTH DAILY. 90 tablet 3  . Calcium Carbonate-Vitamin D (CALCIUM-VITAMIN D) 500-200 MG-UNIT tablet Take 1 tablet by mouth daily.    . carvedilol (COREG) 3.125 MG tablet Take 1 tablet (3.125 mg total) by mouth 2 (two) times daily. 180 tablet 3  . furosemide (LASIX) 40 MG tablet TAKE 1 TABLET BY MOUTH EVERY DAY . TAKE ADDITIONAL 40MG  AS NEEDED FOR SWELLING AND OR WEIGHT 180 tablet 2  . Multiple Vitamin (MULTIVITAMIN WITH MINERALS) TABS Take 1 tablet by mouth daily.    Marland Kitchen amoxicillin (AMOXIL) 500 MG capsule Take two grams prior to dental procedure 4 capsule 0   No facility-administered medications prior to visit.     ROS Review of Systems  Constitutional: Negative for activity change, appetite change, chills, diaphoresis, fatigue and fever.  HENT: Negative.   Eyes: Negative for visual disturbance.  Respiratory: Negative for cough, choking, chest tightness, shortness of breath and stridor.   Cardiovascular: Negative.  Negative for chest pain, palpitations and leg swelling.  Gastrointestinal: Negative.  Negative for blood in stool, constipation, diarrhea and nausea.    Endocrine: Negative.   Genitourinary: Negative.  Negative for difficulty urinating, dysuria, hematuria and urgency.  Musculoskeletal: Negative.  Negative for arthralgias, back pain, joint swelling, myalgias and neck pain.  Skin: Negative.  Negative for color change and rash.  Allergic/Immunologic: Negative.   Neurological: Negative.   Hematological: Negative.  Negative for adenopathy. Does not bruise/bleed easily.  Psychiatric/Behavioral: Negative.     Objective:  BP (!) 144/76 (BP Location: Left Arm, Patient Position: Sitting, Cuff Size: Normal)   Pulse 67   Temp 98.1 F (36.7 C) (Oral)   Resp 16   Ht 5\' 11"  (1.803 m)   Wt 212 lb (96.2 kg)   SpO2 97%   BMI 29.57 kg/m   BP Readings from Last 3 Encounters:  11/14/15 (!) 144/76  06/07/15 (!) 150/84  03/26/15 (!) 180/68    Wt Readings from Last 3 Encounters:  11/14/15 212 lb (96.2 kg)  06/07/15 212 lb 1.9 oz (96.2 kg)  03/26/15 221 lb 12.8 oz (100.6 kg)    Physical Exam  Constitutional: He is oriented to person, place, and time. No distress.  HENT:  Mouth/Throat: Oropharynx is clear and moist. No oropharyngeal exudate.  Eyes: Conjunctivae are normal. Right eye exhibits no discharge. Left eye exhibits no discharge. No scleral icterus.  Neck: Normal range of motion. Neck supple. No JVD present. No tracheal deviation present. No thyromegaly present.  Cardiovascular: Normal rate, regular rhythm, S1 normal, S2 normal and intact distal pulses.  Exam reveals no gallop and no friction rub.   Murmur heard.  Systolic murmur is present with a grade  of 1/6   No diastolic murmur is present  Pulmonary/Chest: Effort normal and breath sounds normal. No stridor. No respiratory distress. He has no wheezes. He has no rales. He exhibits no tenderness.  Abdominal: Soft. Bowel sounds are normal. He exhibits no distension and no mass. There is no tenderness. There is no rebound and no guarding.  Musculoskeletal: Normal range of motion. He  exhibits no edema, tenderness or deformity.  Lymphadenopathy:    He has no cervical adenopathy.  Neurological: He is oriented to person, place, and time.  Skin: Skin is warm and dry. No rash noted. He is not diaphoretic. No erythema. No pallor.  Vitals reviewed.   Lab Results  Component Value Date   WBC 8.1 11/14/2015   HGB 14.4 11/14/2015   HCT 42.3 11/14/2015   PLT 202.0 11/14/2015   GLUCOSE 99 11/14/2015   CHOL 154 11/14/2015   TRIG 84.0 11/14/2015   HDL 48.90 11/14/2015   LDLCALC 89 11/14/2015   ALT 20 11/14/2015   AST 21 11/14/2015   NA 141 11/14/2015   K 4.7 11/14/2015   CL 102 11/14/2015   CREATININE 0.91 11/14/2015   BUN 23 11/14/2015   CO2 32 11/14/2015   TSH 2.51 11/14/2015   PSA 0.98 09/01/2010   INR 0.97 11/25/2011    Ct Angio Chest Pe W/cm &/or Wo Cm  Result Date: 12/25/2013 CLINICAL DATA:  Sternal chest pain with elevated D-dimer 3 months. EXAM: CT ANGIOGRAPHY CHEST WITH CONTRAST TECHNIQUE: Multidetector CT imaging of the chest was performed using the standard protocol during bolus administration of intravenous contrast. Multiplanar CT image reconstructions and MIPs were obtained to evaluate the vascular anatomy. CONTRAST:  161mL OMNIPAQUE IOHEXOL 350 MG/ML SOLN COMPARISON:  CT 04/18/2012 as well as chest x-ray 11/25/2011 FINDINGS: Lungs are adequately inflated without consolidation or effusion. Airways are within normal. Heart is normal size. Sternotomy wires are present. The pulmonary arterial system is well opacified without evidence of emboli. There is mild calcified plaque over the thoracic aorta. There is no mediastinal, hilar or axillary adenopathy. Remaining mediastinal structures are unremarkable. Images through the upper abdomen demonstrate a partially imaged simple cystic structure over the upper pole of the right kidney showed represent a simple cyst incompletely visualized on the prior exam. There are moderate degenerative changes of the spine. Mild stable  loss of anterior vertebral body height of an upper thoracic vertebral body. Review of the MIP images confirms the above findings. IMPRESSION: No evidence of pulmonary embolism and no evidence of acute cardiopulmonary disease. Post CABG. Right renal cyst. Electronically Signed   By: Marin Olp M.D.   On: 12/25/2013 18:28    Assessment & Plan:   Luis Herrera was seen today for hypertension and hyperlipidemia.  Diagnoses and all orders for this visit:  Essential hypertension, benign- His blood pressure is well controlled, electrolytes and renal function are stable. -     CBC with Differential/Platelet; Future  Overweight (BMI 25.0-29.9)- he agrees to work on his lifestyle modifications to lose weight.  Hyperlipidemia with target LDL less than 130- he has achieved his LDL goal is doing well on the statin. -     Lipid panel; Future -     Comprehensive metabolic panel; Future -     TSH; Future  Need for prophylactic vaccination and inoculation against influenza -     Flu vaccine HIGH DOSE PF (Fluzone High dose)  Other orders -     Zoster Vaccine Live, PF, (ZOSTAVAX) 09811 UNT/0.65ML injection; Inject  19,400 Units into the skin once.   I have discontinued Mr. Colato amoxicillin. I am also having him start on Zoster Vaccine Live (PF). Additionally, I am having him maintain his multivitamin with minerals, aspirin, calcium-vitamin D, carvedilol, atorvastatin, furosemide, amLODipine-olmesartan, BESIVANCE, PROLENSA, and DUREZOL.  Meds ordered this encounter  Medications  . BESIVANCE 0.6 % SUSP  . PROLENSA 0.07 % SOLN  . DUREZOL 0.05 % EMUL  . Zoster Vaccine Live, PF, (ZOSTAVAX) [id number] UNT/0.65ML injection    Sig: Inject 19,400 Units into the skin once.    Dispense:  1 each    Refill:  0     Follow-up: Return in about 2 months (around 01/14/2016).  Scarlette Calico, MD

## 2015-11-14 NOTE — Patient Instructions (Signed)
Hypertension Hypertension, commonly called high blood pressure, is when the force of blood pumping through your arteries is too strong. Your arteries are the blood vessels that carry blood from your heart throughout your body. A blood pressure reading consists of a higher number over a lower number, such as 110/72. The higher number (systolic) is the pressure inside your arteries when your heart pumps. The lower number (diastolic) is the pressure inside your arteries when your heart relaxes. Ideally you want your blood pressure below 120/80. Hypertension forces your heart to work harder to pump blood. Your arteries may become narrow or stiff. Having untreated or uncontrolled hypertension can cause heart attack, stroke, kidney disease, and other problems. RISK FACTORS Some risk factors for high blood pressure are controllable. Others are not.  Risk factors you cannot control include:   Race. You may be at higher risk if you are African American.  Age. Risk increases with age.  Gender. Men are at higher risk than women before age 45 years. After age 65, women are at higher risk than men. Risk factors you can control include:  Not getting enough exercise or physical activity.  Being overweight.  Getting too much fat, sugar, calories, or salt in your diet.  Drinking too much alcohol. SIGNS AND SYMPTOMS Hypertension does not usually cause signs or symptoms. Extremely high blood pressure (hypertensive crisis) may cause headache, anxiety, shortness of breath, and nosebleed. DIAGNOSIS To check if you have hypertension, your health care provider will measure your blood pressure while you are seated, with your arm held at the level of your heart. It should be measured at least twice using the same arm. Certain conditions can cause a difference in blood pressure between your right and left arms. A blood pressure reading that is higher than normal on one occasion does not mean that you need treatment. If  it is not clear whether you have high blood pressure, you may be asked to return on a different day to have your blood pressure checked again. Or, you may be asked to monitor your blood pressure at home for 1 or more weeks. TREATMENT Treating high blood pressure includes making lifestyle changes and possibly taking medicine. Living a healthy lifestyle can help lower high blood pressure. You may need to change some of your habits. Lifestyle changes may include:  Following the DASH diet. This diet is high in fruits, vegetables, and whole grains. It is low in salt, red meat, and added sugars.  Keep your sodium intake below 2,300 mg per day.  Getting at least 30-45 minutes of aerobic exercise at least 4 times per week.  Losing weight if necessary.  Not smoking.  Limiting alcoholic beverages.  Learning ways to reduce stress. Your health care provider may prescribe medicine if lifestyle changes are not enough to get your blood pressure under control, and if one of the following is true:  You are 18-59 years of age and your systolic blood pressure is above 140.  You are 60 years of age or older, and your systolic blood pressure is above 150.  Your diastolic blood pressure is above 90.  You have diabetes, and your systolic blood pressure is over 140 or your diastolic blood pressure is over 90.  You have kidney disease and your blood pressure is above 140/90.  You have heart disease and your blood pressure is above 140/90. Your personal target blood pressure may vary depending on your medical conditions, your age, and other factors. HOME CARE INSTRUCTIONS    Have your blood pressure rechecked as directed by your health care provider.   Take medicines only as directed by your health care provider. Follow the directions carefully. Blood pressure medicines must be taken as prescribed. The medicine does not work as well when you skip doses. Skipping doses also puts you at risk for  problems.  Do not smoke.   Monitor your blood pressure at home as directed by your health care provider. SEEK MEDICAL CARE IF:   You think you are having a reaction to medicines taken.  You have recurrent headaches or feel dizzy.  You have swelling in your ankles.  You have trouble with your vision. SEEK IMMEDIATE MEDICAL CARE IF:  You develop a severe headache or confusion.  You have unusual weakness, numbness, or feel faint.  You have severe chest or abdominal pain.  You vomit repeatedly.  You have trouble breathing. MAKE SURE YOU:   Understand these instructions.  Will watch your condition.  Will get help right away if you are not doing well or get worse.   This information is not intended to replace advice given to you by your health care provider. Make sure you discuss any questions you have with your health care provider.   Document Released: 02/02/2005 Document Revised: 06/19/2014 Document Reviewed: 11/25/2012 Elsevier Interactive Patient Education 2016 Elsevier Inc.  

## 2015-11-21 ENCOUNTER — Ambulatory Visit: Payer: Medicare Other | Admitting: Internal Medicine

## 2015-11-27 DIAGNOSIS — M216X2 Other acquired deformities of left foot: Secondary | ICD-10-CM | POA: Diagnosis not present

## 2015-11-27 DIAGNOSIS — D2372 Other benign neoplasm of skin of left lower limb, including hip: Secondary | ICD-10-CM | POA: Diagnosis not present

## 2015-11-27 DIAGNOSIS — D2371 Other benign neoplasm of skin of right lower limb, including hip: Secondary | ICD-10-CM | POA: Diagnosis not present

## 2015-11-27 DIAGNOSIS — M21622 Bunionette of left foot: Secondary | ICD-10-CM | POA: Diagnosis not present

## 2016-01-07 DIAGNOSIS — D2372 Other benign neoplasm of skin of left lower limb, including hip: Secondary | ICD-10-CM | POA: Diagnosis not present

## 2016-01-23 ENCOUNTER — Other Ambulatory Visit: Payer: Self-pay

## 2016-01-31 ENCOUNTER — Encounter: Payer: Self-pay | Admitting: Internal Medicine

## 2016-01-31 ENCOUNTER — Ambulatory Visit (INDEPENDENT_AMBULATORY_CARE_PROVIDER_SITE_OTHER): Payer: Medicare Other | Admitting: Internal Medicine

## 2016-01-31 VITALS — BP 144/70 | HR 83 | Ht 71.0 in | Wt 220.8 lb

## 2016-01-31 DIAGNOSIS — R002 Palpitations: Secondary | ICD-10-CM | POA: Diagnosis not present

## 2016-01-31 DIAGNOSIS — E785 Hyperlipidemia, unspecified: Secondary | ICD-10-CM

## 2016-01-31 NOTE — Progress Notes (Signed)
Cardiology Office Note   Date:  01/31/2016   ID:  Ato Cronen, DOB Jul 30, 1940, MRN [id number]  PCP:  Scarlette Calico, MD  Cardiologist:   [name], MD   F?U of AV dz and HTN      History of Present Illness: Gillie Addario is a 75 y.o. male with a history of  AV disease (s/p AVR), CAD, HTN.He was  previously followed by Madelaine Etienne at [facility]. Pt also had a hsitory of PVCs  With this recomm Normal LV function Moderate LVH AV bioprosthesis moved well Mean gradient 22 mm Hg  CT of chest showed no PE  I saw the patient in May  At that time I adjusted BP meds   Since seen he has felt good  No CP  Breathing is OK  NOdizziness   Outpatient Medications Prior to Visit  Medication Sig Dispense Refill  . amLODipine-olmesartan (AZOR) 5-40 MG tablet Take 0.5 tablets by mouth daily. 45 tablet 3  . aspirin 325 MG tablet Take 1 tablet (325 mg total) by mouth 2 (two) times daily. Will stop 11/26/11    . atorvastatin (LIPITOR) 80 MG tablet TAKE 1 TABLET (80 MG TOTAL) BY MOUTH DAILY. 90 tablet 3  . BESIVANCE 0.6 % SUSP     . Calcium Carbonate-Vitamin D (CALCIUM-VITAMIN D) 500-200 MG-UNIT tablet Take 1 tablet by mouth daily.    . carvedilol (COREG) 3.125 MG tablet Take 1 tablet (3.125 mg total) by mouth 2 (two) times daily. 180 tablet 3  . DUREZOL 0.05 % EMUL     . furosemide (LASIX) 40 MG tablet TAKE 1 TABLET BY MOUTH EVERY DAY . TAKE ADDITIONAL 40MG  AS NEEDED FOR SWELLING AND OR WEIGHT 180 tablet 2  . Multiple Vitamin (MULTIVITAMIN WITH MINERALS) TABS Take 1 tablet by mouth daily.    Marland Kitchen PROLENSA 0.07 % SOLN      No facility-administered medications prior to visit.      Allergies:   Sulfamethoxazole-trimethoprim   Past Medical History:  Diagnosis Date  . Aortic stenosis 2008  . Arthritis   . Blood transfusion   . Blood transfusion without reported diagnosis   . CAD (coronary artery disease) 2008  . Complication of anesthesia    hallucinated once  . H/O hiatal hernia   .  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 Hx of colonic polyps   . Hyperlipidemia   . Hypertension     Past Surgical History:  Procedure Laterality Date  . AORTIC VALVE REPLACEMENT  2009   Bioprosthetic at [place]  2008  . CHOLECYSTECTOMY  1982  . COLONOSCOPY    . CORONARY ARTERY BYPASS GRAFT  2009   SVG-PDA w/ AVR  . CORRECTION HAMMER TOE  2011   BILATERAL  . 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 POLYPECTOMY    . TONSILLECTOMY    . TOTAL HIP ARTHROPLASTY Left 2000  . TOTAL KNEE ARTHROPLASTY  2000   left  . TOTAL KNEE ARTHROPLASTY  12/01/2011   Procedure: TOTAL KNEE ARTHROPLASTY;  Surgeon: Mauri Pole, MD;  Location: [facility];  Service: Orthopedics;  Laterality: Right;  . WEIL OSTEOTOMY  05/28/2011     Social History:  The patient  reports that he quit smoking about 42 years ago. He has never used smokeless tobacco. He reports that he does not drink alcohol or use drugs.   Family History:  The patient's family history  includes Colon cancer in his mother; Heart attack in his brother and father; Hyperlipidemia in his other; Hypertension in his brother, other, and sister.    ROS:  Please see the history of present illness. All other systems are reviewed and  Negative to the above problem except as noted.    PHYSICAL EXAM: VS:  BP (!) 144/70   Pulse 83   Ht 5\' 11"  (1.803 m)   Wt 220 lb 12.8 oz (100.2 kg)   BMI 30.80 kg/m   GEN: Well nourished, well developed, in no acute distress  HEENT: normal  Neck: no JVD, carotid bruits, or masses Cardiac: RRR with freq skips  Gr II/VI systolic murmur LSB to base;  No  rubs, or gallops,  1+  edema  LLE   Respiratory:  clear to auscultation bilaterally, normal work of breathing GI: soft, nontender, nondistended, + BS  No hepatomegaly  MS: no deformity Moving all extremities   Skin: warm and dry, no rash Neuro:  Strength and sensation are intact Psych: euthymic  mood, full affect   EKG:  EKG is ordered today.  SR 83 bpm  Freq PVCs (bigeminy/trigeminy)     Lipid Panel    Component Value Date/Time   CHOL 154 11/14/2015 0954   TRIG 84.0 11/14/2015 0954   HDL 48.90 11/14/2015 0954   CHOLHDL 3 11/14/2015 0954   VLDL 16.8 11/14/2015 0954   LDLCALC 89 11/14/2015 0954      Wt Readings from Last 3 Encounters:  01/31/16 220 lb 12.8 oz (100.2 kg)  11/14/15 212 lb (96.2 kg)  06/07/15 212 lb 1.9 oz (96.2 kg)      ASSESSMENT AND PLAN:  1  PVCs  Pt asymptomatic  He has had in past but holter in 2015 showed 3%  I would repeat to assess frquency  2.  HTN  BP is OK  3  HL  Lipids should be lower  It was better in 2015   I would recomm adding Zetia to regimen  Check lipids in a few months  4  AV  dz  S/p replacement  Exam unchanged  Echo 1 year ago  Continue to follow  Stay active   Will f/u in [place] in late summer     Current medicines are reviewed at length with the patient today.  The patient does not have concerns regarding medicines.  Signed, [name], MD  01/31/2016 3:38 PM    [address], [place], [place]  [address] Phone: [phone]; Fax: [phone]

## 2016-01-31 NOTE — Patient Instructions (Signed)
Your physician has recommended you make the following change in your medication:  1.) Zetia 10 mg take one tablet once daily  Your physician has recommended that you wear a holter monitor. Holter monitors are medical devices that record the heart's electrical activity. Doctors most often use these monitors to diagnose arrhythmias. Arrhythmias are problems with the speed or rhythm of the heartbeat. The monitor is a small, portable device. You can wear one while you do your normal daily activities. This is usually used to diagnose what is causing palpitations/syncope (passing out).   Your physician recommends that you return for lab work in: lipids in 2 months  Your physician wants you to follow-up in: August, 2018 with Dr. Harrington Challenger.  You will receive a reminder letter in the mail two months in advance. If you don't receive a letter, please call our office to schedule the follow-up appointment.

## 2016-02-03 ENCOUNTER — Telehealth: Payer: Self-pay | Admitting: Internal Medicine

## 2016-02-03 MED ORDER — EZETIMIBE 10 MG PO TABS: 10.0000 mg | ORAL_TABLET | Freq: Every day | ORAL | 0 refills | Status: DC

## 2016-02-03 NOTE — Telephone Encounter (Signed)
New Message  Per pt states he was to pick up a prescription for Catia but it has not been sent to the pharmacy. Please call back to discuss   *STAT* If patient is at the pharmacy, call can be transferred to refill team.   1. Which medications need to be refilled? (please list name of each medication and dose if known) Catia  2. Which pharmacy/location (including street and city if local pharmacy) is medication to be sent to? [place]

## 2016-02-03 NOTE — Telephone Encounter (Signed)
Had to add to med list and send to pharmacy.

## 2016-02-04 NOTE — Addendum Note (Signed)
Addended by: Patterson Hammersmith A on: 02/04/2016 04:55 PM   Modules accepted: Orders

## 2016-02-14 DIAGNOSIS — D2371 Other benign neoplasm of skin of right lower limb, including hip: Secondary | ICD-10-CM | POA: Diagnosis not present

## 2016-02-14 DIAGNOSIS — M21961 Unspecified acquired deformity of right lower leg: Secondary | ICD-10-CM | POA: Diagnosis not present

## 2016-02-20 ENCOUNTER — Ambulatory Visit (INDEPENDENT_AMBULATORY_CARE_PROVIDER_SITE_OTHER): Payer: Medicare Other

## 2016-02-20 DIAGNOSIS — E785 Hyperlipidemia, unspecified: Secondary | ICD-10-CM

## 2016-02-20 DIAGNOSIS — R002 Palpitations: Secondary | ICD-10-CM | POA: Diagnosis not present

## 2016-02-28 ENCOUNTER — Telehealth: Payer: Self-pay | Admitting: Internal Medicine

## 2016-02-28 NOTE — Telephone Encounter (Signed)
Follow Up:    Pt wants to know if monitor results are ready please.

## 2016-03-02 ENCOUNTER — Other Ambulatory Visit: Payer: Self-pay | Admitting: *Deleted

## 2016-03-02 DIAGNOSIS — I493 Ventricular premature depolarization: Secondary | ICD-10-CM

## 2016-03-02 DIAGNOSIS — Z952 Presence of prosthetic heart valve: Secondary | ICD-10-CM

## 2016-03-02 NOTE — Telephone Encounter (Signed)
Reached patient on mobile number. Reviewed monitor results with patient and recommendation for echocardiogram to re eval pumping function and aortic valve. Pt does not sense the PVCs and is feeling ok.   He is aware scheduling will call him to set up echo and thanked me for the call.

## 2016-03-02 NOTE — Progress Notes (Signed)
Placed order for echo after reviewing holter monitor results and recommendations by Dr. Harrington Challenger with the patient.

## 2016-03-02 NOTE — Telephone Encounter (Signed)
[name] is calling to get results from the 24 hr heart monitor in which he had on 02/20/16 and returned on 02/21/16. States he called on 02/28/16 and has not heard anything back as of yet . Please call    Thanks

## 2016-03-03 ENCOUNTER — Telehealth: Payer: Self-pay | Admitting: Internal Medicine

## 2016-03-03 MED ORDER — EZETIMIBE 10 MG PO TABS
10.0000 mg | ORAL_TABLET | Freq: Every day | ORAL | 11 refills | Status: DC
Start: 2016-03-03 — End: 2017-02-19

## 2016-03-03 NOTE — Telephone Encounter (Signed)
New Message     *STAT* If patient is at the pharmacy, call can be transferred to refill team.   1. Which medications need to be refilled? (please list name of each medication and dose if known)  ezetimibe (ZETIA) 10 MG tablet Take 1 tablet (10 mg total) by mouth daily.     2. Which pharmacy/location (including street and city if local pharmacy) is medication to be sent to? [facility] [address]  3. Do they need a 30 day or 90 day supply? [place]

## 2016-03-06 ENCOUNTER — Other Ambulatory Visit (HOSPITAL_COMMUNITY): Payer: Medicare Other

## 2016-03-10 ENCOUNTER — Ambulatory Visit (HOSPITAL_COMMUNITY): Payer: Medicare Other | Attending: Internal Medicine

## 2016-03-10 ENCOUNTER — Other Ambulatory Visit: Payer: Self-pay

## 2016-03-10 DIAGNOSIS — E785 Hyperlipidemia, unspecified: Secondary | ICD-10-CM | POA: Diagnosis not present

## 2016-03-10 DIAGNOSIS — R002 Palpitations: Secondary | ICD-10-CM | POA: Diagnosis not present

## 2016-03-10 DIAGNOSIS — Z952 Presence of prosthetic heart valve: Secondary | ICD-10-CM

## 2016-03-10 DIAGNOSIS — I493 Ventricular premature depolarization: Secondary | ICD-10-CM | POA: Diagnosis not present

## 2016-03-10 DIAGNOSIS — I119 Hypertensive heart disease without heart failure: Secondary | ICD-10-CM | POA: Insufficient documentation

## 2016-03-10 DIAGNOSIS — I2510 Atherosclerotic heart disease of native coronary artery without angina pectoris: Secondary | ICD-10-CM | POA: Insufficient documentation

## 2016-03-10 DIAGNOSIS — Z953 Presence of xenogenic heart valve: Secondary | ICD-10-CM | POA: Insufficient documentation

## 2016-03-12 ENCOUNTER — Telehealth: Payer: Self-pay | Admitting: Internal Medicine

## 2016-03-12 NOTE — Telephone Encounter (Signed)
New Message    Have you received the results from the Echo yet

## 2016-03-13 ENCOUNTER — Other Ambulatory Visit: Payer: Self-pay | Admitting: *Deleted

## 2016-03-13 MED ORDER — CARVEDILOL 6.25 MG PO TABS: 6.2500 mg | ORAL_TABLET | Freq: Two times a day (BID) | ORAL | 3 refills | Status: DC

## 2016-03-13 NOTE — Telephone Encounter (Signed)
Clarified for patient with Dr. Harrington Challenger that even though he does not sense the PVCs he should still try to take the increased dose of beta blocker.  Pt recalls that after his surgery he was on 6.25 mg for a while and tolerated.   Will send new prescription coreg 6.25 mg to his pharmacy.  If any issues he will call back with symptoms.  Pt appreciative for the call back.

## 2016-03-13 NOTE — Telephone Encounter (Signed)
Pt talked to [facility] yesterday regarding labs, pt has another question

## 2016-04-03 ENCOUNTER — Other Ambulatory Visit: Payer: Medicare Other | Admitting: *Deleted

## 2016-04-03 DIAGNOSIS — R002 Palpitations: Secondary | ICD-10-CM

## 2016-04-03 DIAGNOSIS — E785 Hyperlipidemia, unspecified: Secondary | ICD-10-CM | POA: Diagnosis not present

## 2016-04-03 LAB — LIPID PANEL
CHOL/HDL RATIO: 2.5 ratio (ref 0.0–5.0)
Cholesterol, Total: 129 mg/dL (ref 100–199)
HDL: 51 mg/dL (ref >39)
LDL Calculated: 63 mg/dL (ref 0–99)
Triglycerides: 75 mg/dL (ref 0–149)
VLDL Cholesterol Cal: 15 mg/dL (ref 5–40)

## 2016-04-03 NOTE — Addendum Note (Signed)
Addended by: Eulis Foster on: 04/03/2016 07:37 AM   Modules accepted: Orders

## 2016-04-23 DIAGNOSIS — L821 Other seborrheic keratosis: Secondary | ICD-10-CM | POA: Diagnosis not present

## 2016-04-23 DIAGNOSIS — D044 Carcinoma in situ of skin of scalp and neck: Secondary | ICD-10-CM | POA: Diagnosis not present

## 2016-04-23 DIAGNOSIS — D225 Melanocytic nevi of trunk: Secondary | ICD-10-CM | POA: Diagnosis not present

## 2016-05-01 DIAGNOSIS — D044 Carcinoma in situ of skin of scalp and neck: Secondary | ICD-10-CM | POA: Diagnosis not present

## 2016-05-28 ENCOUNTER — Other Ambulatory Visit: Payer: Self-pay | Admitting: Internal Medicine

## 2016-05-29 NOTE — Telephone Encounter (Signed)
Medication Detail    Disp Refills Start End   ezetimibe (ZETIA) 10 MG tablet 30 tablet 11 03/03/2016    Sig - Route: Take 1 tablet (10 mg total) by mouth daily. - Oral   E-Prescribing Status: Receipt confirmed by pharmacy (03/03/2016 10:14 AM EST)   Pharmacy   [id number] [place], [place]

## 2016-06-04 ENCOUNTER — Encounter (INDEPENDENT_AMBULATORY_CARE_PROVIDER_SITE_OTHER): Payer: Self-pay | Admitting: Orthopedic Surgery

## 2016-06-04 ENCOUNTER — Ambulatory Visit (INDEPENDENT_AMBULATORY_CARE_PROVIDER_SITE_OTHER): Payer: Medicare Other

## 2016-06-04 ENCOUNTER — Ambulatory Visit (INDEPENDENT_AMBULATORY_CARE_PROVIDER_SITE_OTHER): Payer: Medicare Other | Admitting: Orthopedic Surgery

## 2016-06-04 VITALS — Ht 71.0 in | Wt 220.0 lb

## 2016-06-04 DIAGNOSIS — M205X1 Other deformities of toe(s) (acquired), right foot: Secondary | ICD-10-CM | POA: Insufficient documentation

## 2016-06-04 DIAGNOSIS — M79671 Pain in right foot: Secondary | ICD-10-CM | POA: Diagnosis not present

## 2016-06-04 NOTE — Progress Notes (Signed)
Office Visit Note   Patient: Luis Herrera           Date of Birth: 02-May-1940           MRN: [id number] Visit Date: 06/04/2016              Requested by: Janith Lima, MD [address] [place], [place] [address] PCP: Scarlette Calico, MD  Chief Complaint  Patient presents with  . Right Foot - Pain      HPI: Patient seen for initial evaluation for pain beneath the second and third metatarsals right foot. Patient is status post extensive surgery to the right foot approximately 5 years ago. Patient states is exquisite pain beneath the second and third metatarsal heads with painful callus. Patient states he is not taking anything for pain. Of note he is status post aortic valve replacement. Patient denies history of tobacco use denies history of diabetes.  Assessment & Plan: Visit Diagnoses:  1. Pain in right foot   2. Acquired claw toe of right foot     Plan: Recommended proceeding with a takedown of the internal fixation of the second and third metatarsal right foot with revision Weil osteotomy for the second and third metatarsals. Risks and benefits were discussed including infection neurovascular injury persistent pain and need for additional surgery difficulty with wound healing. Patient states he understands wishes to proceed at this time.  Follow-Up Instructions: Return in about 2 weeks (around 06/18/2016).   Ortho Exam  Patient is alert, oriented, no adenopathy, well-dressed, normal affect, normal respiratory effort. Examination patient has antalgic gait. Has a palpable dorsalis pedis pulse. Patient has callus beneath the second and third metatarsal heads and has a very prominent second and third metatarsal. Patient's surgical incisions are well-healed there is no redness no cellulitis no signs of infection.  Imaging: Xr Foot Complete Right  Result Date: 06/04/2016 Three-view radiographs of the right foot shows fusion across the base of the first and second  metatarsal including the medial and middle cuneiform a fusion of the IP joint great toe Weil osteotomies of the second third and fourth metatarsals with fusion of the PIP joint of the second third and fourth toes with PIP resection of the fifth toe. Patient has a long second and third metatarsal head approximately a centimeter longer than the normal cascade. There is calcification of the dorsalis pedis artery   Labs: Lab Results  Component Value Date   REPTSTATUS 08/03/2013 FINAL 08/02/2013   GRAMSTAIN Few 07/24/2013   GRAMSTAIN WBC present-both PMN and Mononuclear 07/24/2013   GRAMSTAIN No Squamous Epithelial Cells Seen 07/24/2013   GRAMSTAIN Rare Gram Positive Cocci In Pairs 07/24/2013   CULT NO GROWTH Performed at [facility] 08/02/2013   LABORGA Multiple Organisms Present,None Predominant 07/24/2013    Orders:  Orders Placed This Encounter  Procedures  . XR Foot Complete Right   No orders of the defined types were placed in this encounter.    Procedures: No procedures performed  Clinical Data: No additional findings.  ROS:  All other systems negative, except as noted in the HPI. Review of Systems  Objective: Vital Signs: Ht 5\' 11"  (1.803 m)   Wt 220 lb (99.8 kg)   BMI 30.68 kg/m   Specialty Comments:  No specialty comments available.  PMFS History: Patient Active Problem List   Diagnosis Date Noted  . Acquired claw toe of right foot 06/04/2016  . Overweight (BMI 25.0-29.9) 12/02/2011  . 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 Back pain of thoracolumbar region 09/01/2010  . Routine general medical examination at a health care facility 09/01/2010  . Hyperlipidemia with target LDL less than 130 04/10/2010  . Essential hypertension, benign 04/10/2010  . 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 Aortic stenosis 2008  . Arthritis   . Blood transfusion   . Blood transfusion without reported diagnosis     . CAD (coronary artery disease) 2008  . Complication of anesthesia    hallucinated once  . H/O hiatal hernia   . 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 Hx of colonic polyps   . Hyperlipidemia   . Hypertension     Family History  Problem Relation Age of Onset  . Colon cancer Mother   . Heart attack Father   . Hypertension Other   . Hyperlipidemia Other   . Heart attack Brother   . Hypertension Sister   . Hypertension Brother   . Stroke Neg Hx     Past Surgical History:  Procedure Laterality Date  . AORTIC VALVE REPLACEMENT  2009   Bioprosthetic at [place]  2008  . CHOLECYSTECTOMY  1982  . COLONOSCOPY    . CORONARY ARTERY BYPASS GRAFT  2009   SVG-PDA w/ AVR  . CORRECTION HAMMER TOE  2011   BILATERAL  . 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 POLYPECTOMY    . TONSILLECTOMY    . TOTAL HIP ARTHROPLASTY Left 2000  . TOTAL KNEE ARTHROPLASTY  2000   left  . TOTAL KNEE ARTHROPLASTY  12/01/2011   Procedure: TOTAL KNEE ARTHROPLASTY;  Surgeon: Mauri Pole, MD;  Location: [facility];  Service: Orthopedics;  Laterality: Right;  . WEIL OSTEOTOMY  05/28/2011   Social History   Occupational History  . retired    Social History Main Topics  . Smoking status: Former Smoker    Quit date: 05/25/1973  . Smokeless tobacco: Never Used  . Alcohol use No     Comment: rare  . Drug use: No  . Sexual activity: Not Currently

## 2016-06-09 ENCOUNTER — Other Ambulatory Visit: Payer: Self-pay | Admitting: Internal Medicine

## 2016-06-19 DIAGNOSIS — M7751 Other enthesopathy of right foot: Secondary | ICD-10-CM | POA: Diagnosis not present

## 2016-06-19 DIAGNOSIS — M25571 Pain in right ankle and joints of right foot: Secondary | ICD-10-CM | POA: Diagnosis not present

## 2016-06-19 DIAGNOSIS — M79671 Pain in right foot: Secondary | ICD-10-CM | POA: Diagnosis not present

## 2016-06-19 DIAGNOSIS — B079 Viral wart, unspecified: Secondary | ICD-10-CM | POA: Diagnosis not present

## 2016-06-30 ENCOUNTER — Telehealth (INDEPENDENT_AMBULATORY_CARE_PROVIDER_SITE_OTHER): Payer: Self-pay | Admitting: Orthopedic Surgery

## 2016-06-30 NOTE — Telephone Encounter (Signed)
Patient signed medical records release form only filling in the name of the office to send records to "[facility]" I mailed this back to him highlighting the mailing address and fax number with a note asking him to fill in either the address or the fax and return it to the office.

## 2016-07-03 DIAGNOSIS — M79671 Pain in right foot: Secondary | ICD-10-CM | POA: Diagnosis not present

## 2016-07-16 ENCOUNTER — Telehealth: Payer: Self-pay | Admitting: Internal Medicine

## 2016-07-16 NOTE — Telephone Encounter (Signed)
Will route to Dr. Ross for review.  

## 2016-07-16 NOTE — Telephone Encounter (Signed)
New message    Request for surgical clearance:  1. What type of surgery is being performed? Osteotomy of 2nd and 3rd metatarsals right foot  2. When is this surgery scheduled? Tuesday June 5th   3. Are there any medications that need to be held prior to surgery and how long? Aspirin--they want to know if he needs to stop or stay on it.  4. Name of physician performing surgery? Dr. Meridee Score   5. What is your office phone and fax number? [phone]

## 2016-07-17 ENCOUNTER — Telehealth (INDEPENDENT_AMBULATORY_CARE_PROVIDER_SITE_OTHER): Payer: Self-pay | Admitting: Orthopedic Surgery

## 2016-07-17 NOTE — Telephone Encounter (Signed)
Stop aspirin  Resume after surgery. Pt called

## 2016-07-17 NOTE — Telephone Encounter (Signed)
Follow up       Pt request to talk to [facility]. Please call before you leave today

## 2016-07-17 NOTE — Telephone Encounter (Signed)
LVM with pt that we are still awaiting clearance from Dr. Harrington Challenger for his surgery on Tuesday.

## 2016-07-20 NOTE — Telephone Encounter (Signed)
Clearance faxed via Epic.  Patient made aware.

## 2016-07-20 NOTE — Telephone Encounter (Signed)
Follow up    Pt is calling asking that his clearance be sent to [facility]. He said he spoke with Dr. Harrington Challenger last week.

## 2016-07-21 ENCOUNTER — Encounter: Payer: Self-pay | Admitting: Orthopedic Surgery

## 2016-07-21 ENCOUNTER — Telehealth: Payer: Self-pay | Admitting: Internal Medicine

## 2016-07-21 DIAGNOSIS — T84116S Breakdown (mechanical) of internal fixation device of bone of right lower leg, sequela: Secondary | ICD-10-CM | POA: Diagnosis not present

## 2016-07-21 DIAGNOSIS — Z472 Encounter for removal of internal fixation device: Secondary | ICD-10-CM | POA: Diagnosis not present

## 2016-07-21 DIAGNOSIS — M2041 Other hammer toe(s) (acquired), right foot: Secondary | ICD-10-CM | POA: Diagnosis not present

## 2016-07-21 DIAGNOSIS — M205X1 Other deformities of toe(s) (acquired), right foot: Secondary | ICD-10-CM | POA: Diagnosis not present

## 2016-07-21 DIAGNOSIS — G8918 Other acute postprocedural pain: Secondary | ICD-10-CM | POA: Diagnosis not present

## 2016-07-21 NOTE — Telephone Encounter (Signed)
Will route to Dr. Harrington Challenger to make her aware.

## 2016-07-21 NOTE — Telephone Encounter (Signed)
New Message     Surgery on foot went well, he is home now. Just FYI

## 2016-07-23 ENCOUNTER — Telehealth (INDEPENDENT_AMBULATORY_CARE_PROVIDER_SITE_OTHER): Payer: Self-pay | Admitting: Orthopedic Surgery

## 2016-07-23 NOTE — Telephone Encounter (Signed)
Patient called asked when can he put weight on his right foot? Patient said he has not had any pain yet. The number to contact patient is [phone]

## 2016-07-23 NOTE — Telephone Encounter (Signed)
Advised patient to be nonweightbearing until follow up appointment in office for reevaluation.

## 2016-07-27 ENCOUNTER — Ambulatory Visit (INDEPENDENT_AMBULATORY_CARE_PROVIDER_SITE_OTHER): Payer: Medicare Other | Admitting: Family

## 2016-07-27 DIAGNOSIS — E663 Overweight: Secondary | ICD-10-CM

## 2016-07-27 DIAGNOSIS — M205X1 Other deformities of toe(s) (acquired), right foot: Secondary | ICD-10-CM

## 2016-07-27 NOTE — Progress Notes (Signed)
Post-Op Visit Note   Patient: Luis Herrera           Date of Birth: 09/07/1940           MRN: [id number] Visit Date: 07/27/2016 PCP: Janith Lima, MD  Chief Complaint: No chief complaint on file.   HPI:  The patient is a 76 year old gentleman seen status post weil osteotomy for claw toes right foot 2nd and 3rd toes. No complaints has been non weight bearing on kneeling scooter. States hasnt had "a drop of pain."    Ortho Exam Right foot with moderate swelling. The incision is well approximated with sutures. No drainage today. No erythema or odor. No sign of infection.  Visit Diagnoses:  1. Acquired claw toe of right foot   2. Overweight (BMI 25.0-29.9)     Plan: remain nonweight bearing. Begin daily dial soap cleansing. Follow up in office in 1 week for suture removal. Anticipate advancing weight bearing at that time.   Follow-Up Instructions: Return in about 1 week (around 08/03/2016) for suture removal.   Imaging: No results found.  Orders:  No orders of the defined types were placed in this encounter.  No orders of the defined types were placed in this encounter.    PMFS History: Patient Active Problem List   Diagnosis Date Noted  . Acquired claw toe of right foot 06/04/2016  . Overweight (BMI 25.0-29.9) 12/02/2011  . 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 Back pain of thoracolumbar region 09/01/2010  . Routine general medical examination at a health care facility 09/01/2010  . Hyperlipidemia with target LDL less than 130 04/10/2010  . Essential hypertension, benign 04/10/2010  . 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 Aortic stenosis 2008  . Arthritis   . Blood transfusion   . Blood transfusion without reported diagnosis   . CAD (coronary artery disease) 2008  . Complication of anesthesia    hallucinated once  . H/O hiatal hernia   . 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 Hx of colonic  polyps   . Hyperlipidemia   . Hypertension     Family History  Problem Relation Age of Onset  . Colon cancer Mother   . Heart attack Father   . Hypertension Other   . Hyperlipidemia Other   . Heart attack Brother   . Hypertension Sister   . Hypertension Brother   . Stroke Neg Hx     Past Surgical History:  Procedure Laterality Date  . AORTIC VALVE REPLACEMENT  2009   Bioprosthetic at [place]  2008  . CHOLECYSTECTOMY  1982  . COLONOSCOPY    . CORONARY ARTERY BYPASS GRAFT  2009   SVG-PDA w/ AVR  . CORRECTION HAMMER TOE  2011   BILATERAL  . 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 POLYPECTOMY    . TONSILLECTOMY    . TOTAL HIP ARTHROPLASTY Left 2000  . TOTAL KNEE ARTHROPLASTY  2000   left  . TOTAL KNEE ARTHROPLASTY  12/01/2011   Procedure: TOTAL KNEE ARTHROPLASTY;  Surgeon: Mauri Pole, MD;  Location: [facility];  Service: Orthopedics;  Laterality: Right;  . WEIL OSTEOTOMY  05/28/2011   Social History   Occupational History  . retired    Social History Main Topics  . Smoking status: Former Smoker    Quit date: 05/25/1973  . Smokeless tobacco: Never Used  .  Alcohol use No     Comment: rare  . Drug use: No  . Sexual activity: Not Currently

## 2016-08-03 ENCOUNTER — Ambulatory Visit (INDEPENDENT_AMBULATORY_CARE_PROVIDER_SITE_OTHER): Payer: Medicare Other | Admitting: Orthopedic Surgery

## 2016-08-03 ENCOUNTER — Encounter (INDEPENDENT_AMBULATORY_CARE_PROVIDER_SITE_OTHER): Payer: Self-pay | Admitting: Orthopedic Surgery

## 2016-08-03 VITALS — Ht 71.0 in | Wt 220.0 lb

## 2016-08-03 DIAGNOSIS — M205X1 Other deformities of toe(s) (acquired), right foot: Secondary | ICD-10-CM

## 2016-08-03 NOTE — Progress Notes (Signed)
Office Visit Note   Patient: Luis Herrera           Date of Birth: February 01, 1941           MRN: [id number] Visit Date: 08/03/2016              Requested by: Janith Lima, MD [address] [place], [place] [address] PCP: Janith Lima, MD  Chief Complaint  Patient presents with  . Right Foot - Routine Post Op    Weil osteotomy right foot 2nd and 3rd toes. 2 weeks po      HPI: Patient is 2 weeks status post Weil osteotomy for clawing of the toes right foot.  Assessment & Plan: Visit Diagnoses:  1. Acquired claw toe of right foot     Plan: Sutures harvested. Patient does have venous stasis swelling. He will go to [employer] [facility] to obtain a pair of 15-20 mm compression stockings he will wear these around-the-clock change them daily. Continue protected weightbearing with the kneeling scooter  Follow-Up Instructions: Return in about 2 weeks (around 08/17/2016).   Ortho Exam  Patient is alert, oriented, no adenopathy, well-dressed, normal affect, normal respiratory effort. Examination patient does have venous stasis swelling the wound is gaped open about 2 mm but there is good granulation tissue no cellulitis no drainage no odor no signs of infection. Patient states that he has had no pain postoperatively. There is no pain with weightbearing on the plantar aspect of his foot.  Imaging: No results found.  Labs: Lab Results  Component Value Date   REPTSTATUS 08/03/2013 FINAL 08/02/2013   GRAMSTAIN Few 07/24/2013   GRAMSTAIN WBC present-both PMN and Mononuclear 07/24/2013   GRAMSTAIN No Squamous Epithelial Cells Seen 07/24/2013   GRAMSTAIN Rare Gram Positive Cocci In Pairs 07/24/2013   CULT NO GROWTH Performed at [facility] 08/02/2013   LABORGA Multiple Organisms Present,None Predominant 07/24/2013    Orders:  No orders of the defined types were placed in this encounter.  No orders of the defined types were placed in this encounter.    Procedures: No procedures performed  Clinical Data: No additional findings.  ROS:  All other systems negative, except as noted in the HPI. Review of Systems  Objective: Vital Signs: Ht 5\' 11"  (1.803 m)   Wt 220 lb (99.8 kg)   BMI 30.68 kg/m   Specialty Comments:  No specialty comments available.  PMFS History: Patient Active Problem List   Diagnosis Date Noted  . Acquired claw toe of right foot 06/04/2016  . Overweight (BMI 25.0-29.9) 12/02/2011  . 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 Back pain of thoracolumbar region 09/01/2010  . Routine general medical examination at a health care facility 09/01/2010  . Hyperlipidemia with target LDL less than 130 04/10/2010  . Essential hypertension, benign 04/10/2010  . 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 Aortic stenosis 2008  . Arthritis   . Blood transfusion   . Blood transfusion without reported diagnosis   . CAD (coronary artery disease) 2008  . Complication of anesthesia    hallucinated once  . H/O hiatal hernia   . 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 Hx of colonic polyps   . Hyperlipidemia   . Hypertension     Family History  Problem Relation Age of Onset  . Colon cancer Mother   . Heart attack Father   . Hypertension Other   .  Hyperlipidemia Other   . Heart attack Brother   . Hypertension Sister   . Hypertension Brother   . Stroke Neg Hx     Past Surgical History:  Procedure Laterality Date  . AORTIC VALVE REPLACEMENT  2009   Bioprosthetic at [place]  2008  . CHOLECYSTECTOMY  1982  . COLONOSCOPY    . CORONARY ARTERY BYPASS GRAFT  2009   SVG-PDA w/ AVR  . CORRECTION HAMMER TOE  2011   BILATERAL  . 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 POLYPECTOMY    . TONSILLECTOMY    . TOTAL HIP ARTHROPLASTY Left 2000  . TOTAL KNEE ARTHROPLASTY  2000   left  . TOTAL KNEE ARTHROPLASTY   12/01/2011   Procedure: TOTAL KNEE ARTHROPLASTY;  Surgeon: Mauri Pole, MD;  Location: [facility];  Service: Orthopedics;  Laterality: Right;  . WEIL OSTEOTOMY  05/28/2011   Social History   Occupational History  . retired    Social History Main Topics  . Smoking status: Former Smoker    Quit date: 05/25/1973  . Smokeless tobacco: Never Used  . Alcohol use No     Comment: rare  . Drug use: No  . Sexual activity: Not Currently

## 2016-08-17 ENCOUNTER — Ambulatory Visit (INDEPENDENT_AMBULATORY_CARE_PROVIDER_SITE_OTHER): Payer: Medicare Other | Admitting: Orthopedic Surgery

## 2016-08-17 ENCOUNTER — Encounter (INDEPENDENT_AMBULATORY_CARE_PROVIDER_SITE_OTHER): Payer: Self-pay | Admitting: Orthopedic Surgery

## 2016-08-17 VITALS — Ht 71.0 in | Wt 220.0 lb

## 2016-08-17 DIAGNOSIS — M205X1 Other deformities of toe(s) (acquired), right foot: Secondary | ICD-10-CM

## 2016-08-17 DIAGNOSIS — L84 Corns and callosities: Secondary | ICD-10-CM

## 2016-08-17 NOTE — Progress Notes (Signed)
Office Visit Note   Patient: Luis Herrera           Date of Birth: [birth year]           MRN: [id number] Visit Date: 08/17/2016              Requested by: Janith Lima, MD [address] [place], [place] [address] PCP: Janith Lima, MD  Chief Complaint  Patient presents with  . Right Foot - Routine Post Op    Weil osteotomy 2nd and 3rd toes 4 weeks post op      HPI: Patient is 4 weeks status post Weil osteotomy for clawing of the toes right foot. Has resumed regular shoe wear. Is in sketchers today. States tried new balance walking shoes. These were uncomfortable. Requests a recommendation for a soft orthotic.  Continued pain beneath the ball of his foot.  In vive compression stockings today.   Assessment & Plan: Visit Diagnoses:  1. Acquired claw toe of right foot   2. Hard corn     Plan:   Follow-Up Instructions: Return in about 3 weeks (around 09/07/2016), or if symptoms worsen or fail to improve.   Ortho Exam  Patient is alert, oriented, no adenopathy, well-dressed, normal affect, normal respiratory effort. Examination patient does have venous stasis swelling the wound is gaped open about 2 mm but there is good granulation tissue no cellulitis no drainage no odor no signs of infection. Patient states that he has had no pain postoperatively. There is no pain with weightbearing on the plantar aspect of his foot.  Imaging: No results found.  Labs: Lab Results  Component Value Date   REPTSTATUS 08/03/2013 FINAL 08/02/2013   GRAMSTAIN Few 07/24/2013   GRAMSTAIN WBC present-both PMN and Mononuclear 07/24/2013   GRAMSTAIN No Squamous Epithelial Cells Seen 07/24/2013   GRAMSTAIN Rare Gram Positive Cocci In Pairs 07/24/2013   CULT NO GROWTH Performed at [facility] 08/02/2013   LABORGA Multiple Organisms Present,None Predominant 07/24/2013    Orders:  No orders of the defined types were placed in this encounter.  No orders of the defined  types were placed in this encounter.    Procedures: No procedures performed  Clinical Data: No additional findings.  ROS:  All other systems negative, except as noted in the HPI. Review of Systems  Objective: Vital Signs: Ht 5\' 11"  (1.803 m)   Wt 220 lb (99.8 kg)   BMI 30.68 kg/m   Specialty Comments:  No specialty comments available.  PMFS History: Patient Active Problem List   Diagnosis Date Noted  . Acquired claw toe of right foot 06/04/2016  . Overweight (BMI 25.0-29.9) 12/02/2011  . 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 Back pain of thoracolumbar region 09/01/2010  . Routine general medical examination at a health care facility 09/01/2010  . Hyperlipidemia with target LDL less than 130 04/10/2010  . Essential hypertension, benign 04/10/2010  . 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 Aortic stenosis 2008  . Arthritis   . Blood transfusion   . Blood transfusion without reported diagnosis   . CAD (coronary artery disease) 2008  . Complication of anesthesia    hallucinated once  . H/O hiatal hernia   . 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 Hx of colonic polyps   . Hyperlipidemia   . Hypertension     Family History  Problem Relation Age of Onset  .  Colon cancer Mother   . Heart attack Father   . Hypertension Other   . Hyperlipidemia Other   . Heart attack Brother   . Hypertension Sister   . Hypertension Brother   . Stroke Neg Hx     Past Surgical History:  Procedure Laterality Date  . AORTIC VALVE REPLACEMENT  2009   Bioprosthetic at [place]  2008  . CHOLECYSTECTOMY  1982  . COLONOSCOPY    . CORONARY ARTERY BYPASS GRAFT  2009   SVG-PDA w/ AVR  . CORRECTION HAMMER TOE  2011   BILATERAL  . 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 POLYPECTOMY    . TONSILLECTOMY    . TOTAL HIP ARTHROPLASTY Left 2000  . TOTAL KNEE ARTHROPLASTY   2000   left  . TOTAL KNEE ARTHROPLASTY  12/01/2011   Procedure: TOTAL KNEE ARTHROPLASTY;  Surgeon: Mauri Pole, MD;  Location: [facility];  Service: Orthopedics;  Laterality: Right;  . WEIL OSTEOTOMY  05/28/2011   Social History   Occupational History  . retired    Social History Main Topics  . Smoking status: Former Smoker    Quit date: 05/25/1973  . Smokeless tobacco: Never Used  . Alcohol use No     Comment: rare  . Drug use: No  . Sexual activity: Not Currently

## 2016-08-24 ENCOUNTER — Telehealth (INDEPENDENT_AMBULATORY_CARE_PROVIDER_SITE_OTHER): Payer: Self-pay | Admitting: Orthopedic Surgery

## 2016-08-24 NOTE — Telephone Encounter (Signed)
Correct number to call patient back. # [phone]

## 2016-08-24 NOTE — Telephone Encounter (Signed)
I called and spoke with patient he wants appointment for Dr. Sharol Given to evaluate his corns.

## 2016-08-24 NOTE — Telephone Encounter (Signed)
I left a voicemail advising him to call back for his questions or concerns.

## 2016-08-24 NOTE — Telephone Encounter (Signed)
Pt called and asked if you could give him a call back regarding corns he has on his feet.  [phone]

## 2016-08-25 ENCOUNTER — Encounter (INDEPENDENT_AMBULATORY_CARE_PROVIDER_SITE_OTHER): Payer: Self-pay | Admitting: Orthopedic Surgery

## 2016-08-25 ENCOUNTER — Ambulatory Visit (INDEPENDENT_AMBULATORY_CARE_PROVIDER_SITE_OTHER): Payer: Medicare Other | Admitting: Orthopedic Surgery

## 2016-08-25 DIAGNOSIS — M79671 Pain in right foot: Secondary | ICD-10-CM

## 2016-08-25 NOTE — Progress Notes (Signed)
Office Visit Note   Patient: Luis Herrera           Date of Birth: April 25, 1940           MRN: [id number] Visit Date: 08/25/2016              Requested by: Janith Lima, MD [address] [place], [place] [address] PCP: Janith Lima, MD  Chief Complaint  Patient presents with  . Right Foot - Pain  . Left Foot - Pain      HPI: Patient presents status post claw toe surgery complaining of pain in both feet.  Assessment & Plan: Visit Diagnoses:  1. Pain in right foot     Plan: Orthotics were switched socks were applied shoes were replaced on the correct foot patient states his feet felt much better.  Follow-Up Instructions: Return if symptoms worsen or fail to improve.   Ortho Exam  Patient is alert, oriented, no adenopathy, well-dressed, normal affect, normal respiratory effort. Patient has an antalgic gait. Examination he has the opposite orthotic in each shoe. This was changed he has some very small hyperkeratotic lesions that after informed consent a 10 blade knife was used to repair hyperkeratotic lesions 2 Band-Aids were applied. The correct shoe was then placed on the correct foot with the correct orthotic patient states that he felt much better  Imaging: No results found.  Labs: Lab Results  Component Value Date   REPTSTATUS 08/03/2013 FINAL 08/02/2013   GRAMSTAIN Few 07/24/2013   GRAMSTAIN WBC present-both PMN and Mononuclear 07/24/2013   GRAMSTAIN No Squamous Epithelial Cells Seen 07/24/2013   GRAMSTAIN Rare Gram Positive Cocci In Pairs 07/24/2013   CULT NO GROWTH Performed at [facility] 08/02/2013   LABORGA Multiple Organisms Present,None Predominant 07/24/2013    Orders:  No orders of the defined types were placed in this encounter.  No orders of the defined types were placed in this encounter.    Procedures: No procedures performed  Clinical Data: No additional findings.  ROS:  All other systems negative, except as  noted in the HPI. Review of Systems  Objective: Vital Signs: There were no vitals taken for this visit.  Specialty Comments:  No specialty comments available.  PMFS History: Patient Active Problem List   Diagnosis Date Noted  . Acquired claw toe of right foot 06/04/2016  . Overweight (BMI 25.0-29.9) 12/02/2011  . 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 Back pain of thoracolumbar region 09/01/2010  . Routine general medical examination at a health care facility 09/01/2010  . Hyperlipidemia with target LDL less than 130 04/10/2010  . Essential hypertension, benign 04/10/2010  . 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 Aortic stenosis 2008  . Arthritis   . Blood transfusion   . Blood transfusion without reported diagnosis   . CAD (coronary artery disease) 2008  . Complication of anesthesia    hallucinated once  . H/O hiatal hernia   . 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 Hx of colonic polyps   . Hyperlipidemia   . Hypertension     Family History  Problem Relation Age of Onset  . Colon cancer Mother   . Heart attack Father   . Hypertension Other   . Hyperlipidemia Other   . Heart attack Brother   . Hypertension Sister   . Hypertension Brother   . Stroke Neg Hx  Past Surgical History:  Procedure Laterality Date  . AORTIC VALVE REPLACEMENT  2009   Bioprosthetic at [place]  2008  . CHOLECYSTECTOMY  1982  . COLONOSCOPY    . CORONARY ARTERY BYPASS GRAFT  2009   SVG-PDA w/ AVR  . CORRECTION HAMMER TOE  2011   BILATERAL  . 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 POLYPECTOMY    . TONSILLECTOMY    . TOTAL HIP ARTHROPLASTY Left 2000  . TOTAL KNEE ARTHROPLASTY  2000   left  . TOTAL KNEE ARTHROPLASTY  12/01/2011   Procedure: TOTAL KNEE ARTHROPLASTY;  Surgeon: Mauri Pole, MD;  Location: [facility];  Service: Orthopedics;  Laterality: Right;  .  WEIL OSTEOTOMY  05/28/2011   Social History   Occupational History  . retired    Social History Main Topics  . Smoking status: Former Smoker    Quit date: 05/25/1973  . Smokeless tobacco: Never Used  . Alcohol use No     Comment: rare  . Drug use: No  . Sexual activity: Not Currently

## 2016-09-18 ENCOUNTER — Other Ambulatory Visit: Payer: Self-pay | Admitting: Physician Assistant

## 2016-09-22 ENCOUNTER — Encounter (INDEPENDENT_AMBULATORY_CARE_PROVIDER_SITE_OTHER): Payer: Self-pay | Admitting: Orthopedic Surgery

## 2016-09-22 ENCOUNTER — Ambulatory Visit (INDEPENDENT_AMBULATORY_CARE_PROVIDER_SITE_OTHER): Payer: Medicare Other | Admitting: Orthopedic Surgery

## 2016-09-22 DIAGNOSIS — M79671 Pain in right foot: Secondary | ICD-10-CM | POA: Diagnosis not present

## 2016-09-22 DIAGNOSIS — L84 Corns and callosities: Secondary | ICD-10-CM

## 2016-09-22 NOTE — Progress Notes (Signed)
Office Visit Note   Patient: Luis Herrera           Date of Birth: [birth year]           MRN: [id number] Visit Date: 09/22/2016              Requested by: Janith Lima, MD [address] [place], [place] [address] PCP: Janith Lima, MD  Chief Complaint  Patient presents with  . Right Foot - Follow-up  . Left Foot - Follow-up      HPI: Patient is a 76 year old gentleman who presents complaining of painful hyperkeratotic lesions on both feet including the left forefoot and right hindfoot. He is currently wearing medical compression stockings for his venous insufficiency. He is wearing new balance sneakers with over-the-counter orthotics.  Assessment & Plan: Visit Diagnoses:  1. Hard corn   2. Pain in right foot     Plan: Calluses. 4 he tolerated this well he had pain relief after ambulating with the calluses removed. We will plan to follow-up in 4 weeks for repeat evaluation.  Follow-Up Instructions: Return in about 4 weeks (around 10/20/2016).   Ortho Exam  Patient is alert, oriented, no adenopathy, well-dressed, normal affect, normal respiratory effort. Examination patient has an antalgic gait he has palpable pulses bilaterally he has venous stasis swelling but no ulcers no brawny skin color changes. Patient has hyperkeratotic lesions 2 over the left forefoot after informed consent a 10 blade knife was used to pair the hyperkeratotic lesions without complications. There is no petechial bleeding no signs of a plantars wart. Examination the right foot patient also has good pulses he has hyperkeratotic lesions over the right heel. After informed consent a 10 blade knife was used to pair the callus without complications. Patient tolerated this well. There are no venous ulcers in either leg but there is pitting edema.  Imaging: No results found.  Labs: Lab Results  Component Value Date   REPTSTATUS 08/03/2013 FINAL 08/02/2013   GRAMSTAIN Few 07/24/2013   GRAMSTAIN WBC present-both PMN and Mononuclear 07/24/2013   GRAMSTAIN No Squamous Epithelial Cells Seen 07/24/2013   GRAMSTAIN Rare Gram Positive Cocci In Pairs 07/24/2013   CULT NO GROWTH Performed at [facility] 08/02/2013   LABORGA Multiple Organisms Present,None Predominant 07/24/2013    Orders:  No orders of the defined types were placed in this encounter.  No orders of the defined types were placed in this encounter.    Procedures: No procedures performed  Clinical Data: No additional findings.  ROS:  All other systems negative, except as noted in the HPI. Review of Systems  Objective: Vital Signs: There were no vitals taken for this visit.  Specialty Comments:  No specialty comments available.  PMFS History: Patient Active Problem List   Diagnosis Date Noted  . Acquired claw toe of right foot 06/04/2016  . Overweight (BMI 25.0-29.9) 12/02/2011  . 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 Back pain of thoracolumbar region 09/01/2010  . Routine general medical examination at a health care facility 09/01/2010  . Hyperlipidemia with target LDL less than 130 04/10/2010  . Essential hypertension, benign 04/10/2010  . 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 Aortic stenosis 2008  . Arthritis   . Blood transfusion   . Blood transfusion without reported diagnosis   . CAD (coronary artery disease) 2008  . Complication of anesthesia    hallucinated once  . H/O hiatal hernia   .  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 Hx of colonic polyps   . Hyperlipidemia   . Hypertension     Family History  Problem Relation Age of Onset  . Colon cancer Mother   . Heart attack Father   . Hypertension Other   . Hyperlipidemia Other   . Heart attack Brother   . Hypertension Sister   . Hypertension Brother   . Stroke Neg Hx     Past Surgical History:  Procedure Laterality Date  . AORTIC VALVE REPLACEMENT   2009   Bioprosthetic at [place]  2008  . CHOLECYSTECTOMY  1982  . COLONOSCOPY    . CORONARY ARTERY BYPASS GRAFT  2009   SVG-PDA w/ AVR  . CORRECTION HAMMER TOE  2011   BILATERAL  . 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 POLYPECTOMY    . TONSILLECTOMY    . TOTAL HIP ARTHROPLASTY Left 2000  . TOTAL KNEE ARTHROPLASTY  2000   left  . TOTAL KNEE ARTHROPLASTY  12/01/2011   Procedure: TOTAL KNEE ARTHROPLASTY;  Surgeon: Mauri Pole, MD;  Location: [facility];  Service: Orthopedics;  Laterality: Right;  . WEIL OSTEOTOMY  05/28/2011   Social History   Occupational History  . retired    Social History Main Topics  . Smoking status: Former Smoker    Quit date: 05/25/1973  . Smokeless tobacco: Never Used  . Alcohol use No     Comment: rare  . Drug use: No  . Sexual activity: Not Currently

## 2016-10-01 DIAGNOSIS — M21961 Unspecified acquired deformity of right lower leg: Secondary | ICD-10-CM | POA: Diagnosis not present

## 2016-10-01 DIAGNOSIS — D2371 Other benign neoplasm of skin of right lower limb, including hip: Secondary | ICD-10-CM | POA: Diagnosis not present

## 2016-10-07 NOTE — Progress Notes (Signed)
Cardiology Office Note   Date:  10/08/2016   ID:  Luis Herrera, DOB 01-27-1941, MRN [id number]  PCP:  Janith Lima, MD  Cardiologist:   [name], MD   F?U of AV dz and HTN      History of Present Illness: Luis Herrera is a 76 y.o. male with a history of  AV disease (s/p AVR), CAD, HTN.He was  previously followed by Madelaine Etienne at [facility]. Pt also had a hsitory of PVCs  With this recomm Normal LV function Moderate LVH AV bioprosthesis moved well Mean gradient 22 mm Hg  CT of chest showed no PE  I saw the pt in Dec 2017 Echo in Jan 2018 LVEF normal  AV prosthesis was working well Holter showed 13% PVCs  REcomm increasing coreg to 6.25 bid    Pt notes intermitt dizziness  No palpitatisons  Rare CP  At night More like indigestion Breathing is OK    Outpatient Medications Prior to Visit  Medication Sig Dispense Refill  . amLODipine-olmesartan (AZOR) 5-40 MG tablet Take 0.5 tablets by mouth daily. 45 tablet 3  . aspirin 81 MG tablet Take 81 mg by mouth daily.    Marland Kitchen atorvastatin (LIPITOR) 80 MG tablet TAKE 1 TABLET (80 MG TOTAL) BY MOUTH DAILY. 90 tablet 2  . Calcium Carbonate-Vitamin D (CALCIUM-VITAMIN D) 500-200 MG-UNIT tablet Take 1 tablet by mouth daily.    . carvedilol (COREG) 6.25 MG tablet Take 1 tablet (6.25 mg total) by mouth 2 (two) times daily. 180 tablet 3  . ezetimibe (ZETIA) 10 MG tablet Take 1 tablet (10 mg total) by mouth daily. 30 tablet 11  . furosemide (LASIX) 40 MG tablet TAKE 1 TABLET BY MOUTH EVERY DAY . TAKE ADDITIONAL 40MG  AS NEEDED FOR SWELLING AND OR WEIGHT 180 tablet 2  . Multiple Vitamin (MULTIVITAMIN WITH MINERALS) TABS Take 1 tablet by mouth daily.    Marland Kitchen BESIVANCE 0.6 % SUSP     . DUREZOL 0.05 % EMUL     . PROLENSA 0.07 % SOLN      No facility-administered medications prior to visit.      Allergies:   Sulfamethoxazole-trimethoprim   Past Medical History:  Diagnosis Date  . Aortic stenosis 2008  . Arthritis   . Blood transfusion    . Blood transfusion without reported diagnosis   . CAD (coronary artery disease) 2008  . Complication of anesthesia    hallucinated once  . H/O hiatal hernia   . 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 Hx of colonic polyps   . Hyperlipidemia   . Hypertension     Past Surgical History:  Procedure Laterality Date  . AORTIC VALVE REPLACEMENT  2009   Bioprosthetic at [place]  2008  . CHOLECYSTECTOMY  1982  . COLONOSCOPY    . CORONARY ARTERY BYPASS GRAFT  2009   SVG-PDA w/ AVR  . CORRECTION HAMMER TOE  2011   BILATERAL  . 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 POLYPECTOMY    . TONSILLECTOMY    . TOTAL HIP ARTHROPLASTY Left 2000  . TOTAL KNEE ARTHROPLASTY  2000   left  . TOTAL KNEE ARTHROPLASTY  12/01/2011   Procedure: TOTAL KNEE ARTHROPLASTY;  Surgeon: Mauri Pole, MD;  Location: [facility];  Service: Orthopedics;  Laterality: Right;  . WEIL OSTEOTOMY  05/28/2011     Social History:  The patient  reports that  he quit smoking about 43 years ago. He has never used smokeless tobacco. He reports that he does not drink alcohol or use drugs.   Family History:  The patient's family history includes Colon cancer in his mother; Heart attack in his brother and father; Hyperlipidemia in his other; Hypertension in his brother, other, and sister.    ROS:  Please see the history of present illness. All other systems are reviewed and  Negative to the above problem except as noted.    PHYSICAL EXAM: VS:  BP (!) 150/80   Pulse 66   Ht 5\' 11"  (1.803 m)   Wt 220 lb (99.8 kg)   SpO2 96%   BMI 30.68 kg/m   GEN: Well nourished, well developed, in no acute distress  HEENT: normal  Neck: no JVD, carotid bruits, or masses Cardiac: RRR with occas skips  Gr II/VI systolic murmur LSB to base;  No  rubs, or gallops,  No signif edema   Respiratory:  clear to auscultation bilaterally, normal work of breathing GI: soft,  nontender, nondistended, + BS  No hepatomegaly  MS: no deformity Moving all extremities   Skin: warm and dry, no rash Neuro:  Strength and sensation are intact Psych: euthymic mood, full affect   EKG:  EKG is not  ordered today.    Lipid Panel    Component Value Date/Time   CHOL 129 04/03/2016 0737   TRIG 75 04/03/2016 0737   HDL 51 04/03/2016 0737   CHOLHDL 2.5 04/03/2016 0737   CHOLHDL 3 11/14/2015 0954   VLDL 16.8 11/14/2015 0954   LDLCALC 63 04/03/2016 0737      Wt Readings from Last 3 Encounters:  10/08/16 220 lb (99.8 kg)  08/17/16 220 lb (99.8 kg)  08/03/16 220 lb (99.8 kg)      ASSESSMENT AND PLAN:  1  CAD  No symptoms of angina  2  PVCs  Asymptomatic  COntinue coreg   2.  HTN  BP is elevated here  164/ on my check  He says he is nervuous  Will have him keep log  Bring in BP cuff in 4 to 6 wks    3  HL  Check lipids today on meds  4  AV  dz  S/p replacement   Mean gradient unchanged  22 mm Hg  F/U with periodic echoes    Current medicines are reviewed at length with the patient today.  The patient does not have concerns regarding medicines.  Signed, [name], MD  10/08/2016 10:41 AM    [address], [place], [place]  [address] Phone: [phone]; Fax: [phone]

## 2016-10-08 ENCOUNTER — Encounter: Payer: Self-pay | Admitting: Internal Medicine

## 2016-10-08 ENCOUNTER — Ambulatory Visit (INDEPENDENT_AMBULATORY_CARE_PROVIDER_SITE_OTHER): Payer: Medicare Other | Admitting: Internal Medicine

## 2016-10-08 VITALS — BP 150/80 | HR 66 | Ht 71.0 in | Wt 220.0 lb

## 2016-10-08 DIAGNOSIS — Z952 Presence of prosthetic heart valve: Secondary | ICD-10-CM

## 2016-10-08 DIAGNOSIS — N400 Enlarged prostate without lower urinary tract symptoms: Secondary | ICD-10-CM | POA: Diagnosis not present

## 2016-10-08 DIAGNOSIS — E785 Hyperlipidemia, unspecified: Secondary | ICD-10-CM | POA: Diagnosis not present

## 2016-10-08 DIAGNOSIS — I10 Essential (primary) hypertension: Secondary | ICD-10-CM

## 2016-10-08 DIAGNOSIS — I2510 Atherosclerotic heart disease of native coronary artery without angina pectoris: Secondary | ICD-10-CM | POA: Diagnosis not present

## 2016-10-08 LAB — CBC
Hematocrit: 40.9 % (ref 37.5–51.0)
Hemoglobin: 13.6 g/dL (ref 13.0–17.7)
MCH: 30 pg (ref 26.6–33.0)
MCHC: 33.3 g/dL (ref 31.5–35.7)
MCV: 90 fL (ref 79–97)
Platelets: 196 10*3/uL (ref 150–379)
RBC: 4.53 x10E6/uL (ref 4.14–5.80)
RDW: 13.9 % (ref 12.3–15.4)
WBC: 8.6 10*3/uL (ref 3.4–10.8)

## 2016-10-08 LAB — COMPREHENSIVE METABOLIC PANEL
A/G RATIO: 1.4 (ref 1.2–2.2)
ALT: 17 IU/L (ref 0–44)
AST: 20 IU/L (ref 0–40)
Albumin: 4.2 g/dL (ref 3.5–4.8)
Alkaline Phosphatase: 90 IU/L (ref 39–117)
BUN/Creatinine Ratio: 21 (ref 10–24)
BUN: 23 mg/dL (ref 8–27)
Bilirubin Total: 1 mg/dL (ref 0.0–1.2)
CALCIUM: 9.2 mg/dL (ref 8.6–10.2)
CO2: 26 mmol/L (ref 20–29)
CREATININE: 1.11 mg/dL (ref 0.76–1.27)
Chloride: 100 mmol/L (ref 96–106)
GFR calc Af Amer: 75 mL/min/{1.73_m2} (ref >59)
GFR, EST NON AFRICAN AMERICAN: 65 mL/min/{1.73_m2} (ref >59)
GLUCOSE: 97 mg/dL (ref 65–99)
Globulin, Total: 3 g/dL (ref 1.5–4.5)
POTASSIUM: 4.7 mmol/L (ref 3.5–5.2)
Sodium: 141 mmol/L (ref 134–144)
Total Protein: 7.2 g/dL (ref 6.0–8.5)

## 2016-10-08 LAB — LIPID PANEL
Chol/HDL Ratio: 2.5 ratio (ref 0.0–5.0)
Cholesterol, Total: 130 mg/dL (ref 100–199)
HDL: 51 mg/dL (ref >39)
LDL Calculated: 64 mg/dL (ref 0–99)
TRIGLYCERIDES: 77 mg/dL (ref 0–149)
VLDL CHOLESTEROL CAL: 15 mg/dL (ref 5–40)

## 2016-10-08 LAB — PSA: PROSTATE SPECIFIC AG, SERUM: 1.5 ng/mL (ref 0.0–4.0)

## 2016-10-08 LAB — TSH: TSH: 3.18 u[IU]/mL (ref 0.450–4.500)

## 2016-10-08 NOTE — Patient Instructions (Signed)
Your physician recommends that you continue on your current medications as directed. Please refer to the Current Medication list given to you today. Your physician recommends that you schedule a follow-up appointment in: 4-6 weeks with Dr. Harrington Challenger.  --PLEASE BRING YOUR BLOOD PRESSURE CUFF WITH YOU  Your physician recommends that you return for lab work today (LIPIDS, CMET, CBC, TSH, PSA)

## 2016-10-20 ENCOUNTER — Ambulatory Visit (INDEPENDENT_AMBULATORY_CARE_PROVIDER_SITE_OTHER): Payer: Medicare Other | Admitting: Orthopedic Surgery

## 2016-10-28 ENCOUNTER — Other Ambulatory Visit: Payer: Self-pay | Admitting: Internal Medicine

## 2016-10-28 DIAGNOSIS — M21961 Unspecified acquired deformity of right lower leg: Secondary | ICD-10-CM | POA: Diagnosis not present

## 2016-10-28 DIAGNOSIS — D2371 Other benign neoplasm of skin of right lower limb, including hip: Secondary | ICD-10-CM | POA: Diagnosis not present

## 2016-11-05 ENCOUNTER — Encounter: Payer: Self-pay | Admitting: Internal Medicine

## 2016-11-05 ENCOUNTER — Ambulatory Visit (INDEPENDENT_AMBULATORY_CARE_PROVIDER_SITE_OTHER): Payer: Medicare Other | Admitting: Internal Medicine

## 2016-11-05 VITALS — BP 148/58 | HR 36 | Ht 71.0 in | Wt 216.0 lb

## 2016-11-05 DIAGNOSIS — Z952 Presence of prosthetic heart valve: Secondary | ICD-10-CM

## 2016-11-05 DIAGNOSIS — I10 Essential (primary) hypertension: Secondary | ICD-10-CM

## 2016-11-05 DIAGNOSIS — E785 Hyperlipidemia, unspecified: Secondary | ICD-10-CM

## 2016-11-05 DIAGNOSIS — I2510 Atherosclerotic heart disease of native coronary artery without angina pectoris: Secondary | ICD-10-CM | POA: Diagnosis not present

## 2016-11-05 DIAGNOSIS — R002 Palpitations: Secondary | ICD-10-CM

## 2016-11-05 MED ORDER — AMLODIPINE-OLMESARTAN 5-40 MG PO TABS: 1.0000 | ORAL_TABLET | Freq: Every day | ORAL | 3 refills | Status: DC

## 2016-11-05 NOTE — Patient Instructions (Addendum)
Your physician has recommended you make the following change in your medication:  1.) increase Azor to 1 whole tablet daily CONTINUE TO MONITOR YOUR BLOOD PRESSUE AT HOME.  Your physician has recommended that you wear a holter monitor. Holter monitors are medical devices that record the heart's electrical activity. Doctors most often use these monitors to diagnose arrhythmias. Arrhythmias are problems with the speed or rhythm of the heartbeat. The monitor is a small, portable device. You can wear one while you do your normal daily activities. This is usually used to diagnose what is causing palpitations/syncope (passing out).

## 2016-11-05 NOTE — Progress Notes (Signed)
Cardiology Office Note   Date:  11/05/2016   ID:  Luis Herrera, DOB 02-28-1940, MRN [id number]  PCP:  Janith Lima, MD  Cardiologist:   [name], MD   F/U of  HTN      History of Present Illness: Luis Herrera is a 76 y.o. male with a history ofAV disease (s/p AVR), CAD, HTN.He was  previously followed by Madelaine Etienne at [facility]. Pt also had a hsitory of PVCs  With this recomm Normal LV function Moderate LVH AV bioprosthesis moved well Mean gradient 22 mm Hg  CT of chest showed no PE  Echo in Jan 2018 LVEF normal  AV prosthesis was working well Holter showed 13% PVCs  REcomm increasing coreg to 6.25 bid    I saw the pt in Aug 2018  At that time his BP was increased  I recomm following and keeping loog   The pt comes back today with log  Cuff at home systolic 497W to 263Z  Pulse has been 30s to 70s   Denies dizziness  Breathing is stable    Outpatient Medications Prior to Visit  Medication Sig Dispense Refill  . amLODipine-olmesartan (AZOR) 5-40 MG tablet TAKE 1/2 TABLET BY MOUTH EVERY DAY 45 tablet 3  . aspirin 81 MG tablet Take 81 mg by mouth daily.    Marland Kitchen atorvastatin (LIPITOR) 80 MG tablet TAKE 1 TABLET (80 MG TOTAL) BY MOUTH DAILY. 90 tablet 2  . Calcium Carbonate-Vitamin D (CALCIUM-VITAMIN D) 500-200 MG-UNIT tablet Take 1 tablet by mouth daily.    . carvedilol (COREG) 6.25 MG tablet Take 1 tablet (6.25 mg total) by mouth 2 (two) times daily. 180 tablet 3  . ezetimibe (ZETIA) 10 MG tablet Take 1 tablet (10 mg total) by mouth daily. 30 tablet 11  . furosemide (LASIX) 40 MG tablet TAKE 1 TABLET BY MOUTH EVERY DAY . TAKE ADDITIONAL 40MG  AS NEEDED FOR SWELLING AND OR WEIGHT 180 tablet 2  . Multiple Vitamin (MULTIVITAMIN WITH MINERALS) TABS Take 1 tablet by mouth daily.     No facility-administered medications prior to visit.      Allergies:   Sulfamethoxazole-trimethoprim   Past Medical History:  Diagnosis Date  . Aortic stenosis 2008  . Arthritis   . Blood  transfusion   . Blood transfusion without reported diagnosis   . CAD (coronary artery disease) 2008  . Complication of anesthesia    hallucinated once  . H/O hiatal hernia   . 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 Hx of colonic polyps   . Hyperlipidemia   . Hypertension     Past Surgical History:  Procedure Laterality Date  . AORTIC VALVE REPLACEMENT  2009   Bioprosthetic at [facility]  2008  . CHOLECYSTECTOMY  1982  . COLONOSCOPY    . CORONARY ARTERY BYPASS GRAFT  2009   SVG-PDA w/ AVR  . CORRECTION HAMMER TOE  2011   BILATERAL  . 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 POLYPECTOMY    . TONSILLECTOMY    . TOTAL HIP ARTHROPLASTY Left 2000  . TOTAL KNEE ARTHROPLASTY  2000   left  . TOTAL KNEE ARTHROPLASTY  12/01/2011   Procedure: TOTAL KNEE ARTHROPLASTY;  Surgeon: Mauri Pole, MD;  Location: [facility];  Service: Orthopedics;  Laterality: Right;  . WEIL OSTEOTOMY  05/28/2011     Social History:  The patient  reports that he quit  smoking about 43 years ago. He has never used smokeless tobacco. He reports that he does not drink alcohol or use drugs.   Family History:  The patient's family history includes Colon cancer in his mother; Heart attack in his brother and father; Hyperlipidemia in his other; Hypertension in his brother, other, and sister.    ROS:  Please see the history of present illness. All other systems are reviewed and  Negative to the above problem except as noted.    PHYSICAL EXAM: VS:  BP (!) 148/58 Comment: pt home BP cuff Right arm 145/68  Pulse (!) 36   Ht 5\' 11"  (1.803 m)   Wt 216 lb (98 kg)   SpO2 95%   BMI 30.13 kg/m   GEN: Well nourished, well developed, in no acute distress  HEENT: normal  Neck: no JVD, carotid bruits, or masses Cardiac: RRR with freq skips  Gr II/VI systolic murmur LSB to base;  No  rubs, or gallops,  No signif edema   Respiratory:  clear to auscultation  bilaterally, normal work of breathing GI: soft, nontender, nondistended, + BS  No hepatomegaly  MS: no deformity Moving all extremities   Skin: warm and dry, no rash Neuro:  Strength and sensation are intact Psych: euthymic mood, full affect   EKG:  EKG is not  ordered today.    Lipid Panel    Component Value Date/Time   CHOL 130 10/08/2016 1119   TRIG 77 10/08/2016 1119   HDL 51 10/08/2016 1119   CHOLHDL 2.5 10/08/2016 1119   CHOLHDL 3 11/14/2015 0954   VLDL 16.8 11/14/2015 0954   LDLCALC 64 10/08/2016 1119      Wt Readings from Last 3 Encounters:  11/05/16 216 lb (98 kg)  10/08/16 220 lb (99.8 kg)  08/17/16 220 lb (99.8 kg)      ASSESSMENT AND PLAN:  1  CAD  No symptoms of angina  2  PVCs  More prominent on exam    COntinue coreg  Get holter monitor  2.  HTN BP is still high May not be accurate with skipds  He is on 1/2 Azor tablet  I would increase to 1 tablet  Follow at home   3  HL Lipids are good    4  AV  dz  S/p replacement   Mean gradient unchanged  22 mm Hg  F/U with periodic echoes    Current medicines are reviewed at length with the patient today.  The patient does not have concerns regarding medicines.  Signed, [name], MD  11/05/2016 10:18 AM    [address], [place], [place]  [address] Phone: [phone]; Fax: [phone]

## 2016-11-10 ENCOUNTER — Ambulatory Visit (INDEPENDENT_AMBULATORY_CARE_PROVIDER_SITE_OTHER): Payer: Medicare Other

## 2016-11-10 DIAGNOSIS — R002 Palpitations: Secondary | ICD-10-CM

## 2016-11-18 ENCOUNTER — Telehealth: Payer: Self-pay | Admitting: Internal Medicine

## 2016-11-18 DIAGNOSIS — I493 Ventricular premature depolarization: Secondary | ICD-10-CM

## 2016-11-18 DIAGNOSIS — D2371 Other benign neoplasm of skin of right lower limb, including hip: Secondary | ICD-10-CM | POA: Diagnosis not present

## 2016-11-18 DIAGNOSIS — R0789 Other chest pain: Secondary | ICD-10-CM

## 2016-11-18 DIAGNOSIS — M21961 Unspecified acquired deformity of right lower leg: Secondary | ICD-10-CM | POA: Diagnosis not present

## 2016-11-18 NOTE — Telephone Encounter (Signed)
New message ° ° ° ° ° °Calling to get monitor results °

## 2016-11-18 NOTE — Telephone Encounter (Signed)
Patient advised that results have not been reviewed by his provider yet but message would be sent and once reviewed, he would be called with the response.

## 2016-11-19 NOTE — Telephone Encounter (Signed)
-----   Message from Fay Records, MD sent at 11/19/2016  9:01 AM EDT ----- Reviewed with patient  I have reviewed with EP Luis Herrera) Make appt to see him   Also set up for Lexiscan myovue   Pt without symtposm of angina (atypical CP at in bed, not with activity)  But to r/o ischemia

## 2016-11-19 NOTE — Telephone Encounter (Signed)
Spoke to pt

## 2016-11-23 ENCOUNTER — Telehealth (HOSPITAL_COMMUNITY): Payer: Self-pay | Admitting: *Deleted

## 2016-11-23 ENCOUNTER — Telehealth: Payer: Self-pay | Admitting: *Deleted

## 2016-11-23 NOTE — Telephone Encounter (Signed)
Left message on voicemail per DPR in reference to upcoming appointment scheduled on 11/24/16 with detailed instructions given per Myocardial Perfusion Study Information Sheet for the test. LM to arrive 15 minutes early, and that it is imperative to arrive on time for appointment to keep from having the test rescheduled. If you need to cancel or reschedule your appointment, please call the office within 24 hours of your appointment. Failure to do so may result in a cancellation of your appointment, and a $50 no show fee. Phone number given for call back for any questions. [place] Beckford Jacqueline    

## 2016-11-23 NOTE — Telephone Encounter (Signed)
Follow Up:    OPlease call,concerning his Stress Test tomorrow please.

## 2016-11-24 ENCOUNTER — Ambulatory Visit (HOSPITAL_COMMUNITY): Payer: Medicare Other | Attending: Internal Medicine

## 2016-11-24 DIAGNOSIS — R0789 Other chest pain: Secondary | ICD-10-CM | POA: Diagnosis not present

## 2016-11-24 DIAGNOSIS — R9439 Abnormal result of other cardiovascular function study: Secondary | ICD-10-CM | POA: Insufficient documentation

## 2016-11-24 DIAGNOSIS — R002 Palpitations: Secondary | ICD-10-CM | POA: Insufficient documentation

## 2016-11-24 DIAGNOSIS — I10 Essential (primary) hypertension: Secondary | ICD-10-CM | POA: Diagnosis not present

## 2016-11-24 DIAGNOSIS — I493 Ventricular premature depolarization: Secondary | ICD-10-CM

## 2016-11-24 DIAGNOSIS — I2510 Atherosclerotic heart disease of native coronary artery without angina pectoris: Secondary | ICD-10-CM | POA: Insufficient documentation

## 2016-11-24 DIAGNOSIS — I4510 Unspecified right bundle-branch block: Secondary | ICD-10-CM | POA: Insufficient documentation

## 2016-11-24 LAB — MYOCARDIAL PERFUSION IMAGING
LV dias vol: 162 mL (ref 62–150)
LV sys vol: 90 mL
Peak HR: 59 {beats}/min
RATE: 0.29
Rest HR: 62 {beats}/min
SDS: 0
SRS: 0
SSS: 0
TID: 1.1

## 2016-11-24 MED ORDER — TECHNETIUM TC 99M TETROFOSMIN IV KIT
10.2000 | PACK | Freq: Once | INTRAVENOUS | Status: AC | PRN
  Administered 2016-11-24: 10.2 via INTRAVENOUS
  Filled 2016-11-24: qty 11

## 2016-11-24 MED ORDER — TECHNETIUM TC 99M TETROFOSMIN IV KIT
32.4000 | PACK | Freq: Once | INTRAVENOUS | Status: AC | PRN
  Administered 2016-11-24: 32.4 via INTRAVENOUS
  Filled 2016-11-24: qty 33

## 2016-11-24 MED ORDER — REGADENOSON 0.4 MG/5ML IV SOLN
0.4000 mg | Freq: Once | INTRAVENOUS | Status: AC
  Administered 2016-11-24: 0.4 mg via INTRAVENOUS

## 2016-11-27 ENCOUNTER — Ambulatory Visit (INDEPENDENT_AMBULATORY_CARE_PROVIDER_SITE_OTHER): Payer: Medicare Other | Admitting: Internal Medicine

## 2016-11-27 ENCOUNTER — Encounter: Payer: Self-pay | Admitting: Internal Medicine

## 2016-11-27 VITALS — BP 142/52 | HR 58 | Ht 71.0 in | Wt 220.0 lb

## 2016-11-27 DIAGNOSIS — I493 Ventricular premature depolarization: Secondary | ICD-10-CM

## 2016-11-27 DIAGNOSIS — Z23 Encounter for immunization: Secondary | ICD-10-CM | POA: Diagnosis not present

## 2016-11-27 MED ORDER — AMIODARONE HCL 200 MG PO TABS: 200.0000 mg | ORAL_TABLET | Freq: Every day | ORAL | 3 refills | Status: DC

## 2016-11-27 NOTE — Progress Notes (Signed)
HPI [name] is referred today by Dr. Harrington Challenger for evaluation of very dense PVCs. The patient is a very pleasant 76 year old man with a history of aortic valve stenosis, status post aortic valve replacement, dyslipidemia, hypertension, and coronary artery disease. He had done well but noted palpitations several months ago. His 2-D echo almost a year ago demonstrated normal left ventricular systolic function. Recently he underwent stress Myoview study which demonstrated mild left ventricular dysfunction. The accuracy of this study however would be in question secondary to his very dense PVCs. His PVCs originate from the left ventricle and have one dominant focus with several smaller less common foci of electrical activity. He has never had syncope. He is very minimally symptomatic and does not have heart failure symptoms. Allergies  Allergen Reactions  . Sulfamethoxazole-Trimethoprim Other (See Comments)    Carlyn Reichert Syndrome     Current Outpatient Prescriptions  Medication Sig Dispense Refill  . amLODipine-olmesartan (AZOR) 5-40 MG tablet Take 1 tablet by mouth daily. 90 tablet 3  . aspirin 81 MG tablet Take 81 mg by mouth daily.    Marland Kitchen atorvastatin (LIPITOR) 80 MG tablet TAKE 1 TABLET (80 MG TOTAL) BY MOUTH DAILY. 90 tablet 2  . Calcium Carbonate-Vitamin D (CALCIUM-VITAMIN D) 500-200 MG-UNIT tablet Take 1 tablet by mouth daily.    . carvedilol (COREG) 6.25 MG tablet Take 1 tablet (6.25 mg total) by mouth 2 (two) times daily. 180 tablet 3  . ezetimibe (ZETIA) 10 MG tablet Take 1 tablet (10 mg total) by mouth daily. 30 tablet 11  . furosemide (LASIX) 40 MG tablet TAKE 1 TABLET BY MOUTH EVERY DAY . TAKE ADDITIONAL 40MG  AS NEEDED FOR SWELLING AND OR WEIGHT 180 tablet 2  . Multiple Vitamin (MULTIVITAMIN WITH MINERALS) TABS Take 1 tablet by mouth daily.     No current facility-administered medications for this visit.      Past Medical History:  Diagnosis Date  . Aortic stenosis 2008    . Arthritis   . Blood transfusion   . Blood transfusion without reported diagnosis   . CAD (coronary artery disease) 2008  . Complication of anesthesia    hallucinated once  . H/O hiatal hernia   . 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 Hx of colonic polyps   . Hyperlipidemia   . Hypertension     ROS:   All systems reviewed and negative except as noted in the HPI.   Past Surgical History:  Procedure Laterality Date  . AORTIC VALVE REPLACEMENT  2009   Bioprosthetic at [facility]  2008  . CHOLECYSTECTOMY  1982  . COLONOSCOPY    . CORONARY ARTERY BYPASS GRAFT  2009   SVG-PDA w/ AVR  . CORRECTION HAMMER TOE  2011   BILATERAL  . 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 POLYPECTOMY    . TONSILLECTOMY    . TOTAL HIP ARTHROPLASTY Left 2000  . TOTAL KNEE ARTHROPLASTY  2000   left  . TOTAL KNEE ARTHROPLASTY  12/01/2011   Procedure: TOTAL KNEE ARTHROPLASTY;  Surgeon: Mauri Pole, MD;  Location: [facility];  Service: Orthopedics;  Laterality: Right;  . WEIL OSTEOTOMY  05/28/2011     Family History  Problem Relation Age of Onset  . Colon cancer Mother   . Heart attack Father   . Hypertension Other   . Hyperlipidemia Other   . Heart attack Brother   .  Hypertension Sister   . Hypertension Brother   . Stroke Neg Hx      Social History   Social History  . Marital status: Married    Spouse name: N/A  . Number of children: N/A  . Years of education: N/A   Occupational History  . retired    Social History Main Topics  . Smoking status: Former Smoker    Quit date: 05/25/1973  . Smokeless tobacco: Never Used  . Alcohol use No     Comment: rare  . Drug use: No  . Sexual activity: Not Currently   Other Topics Concern  . Not on file   Social History Narrative   Regular exercise-yes. Pt lives in [place] with his wife.     BP (!) 142/52   Pulse (!) 58   Ht 5\' 11"  (1.803 m)   Wt  220 lb (99.8 kg)   SpO2 95%   BMI 30.68 kg/m   Physical Exam:  Well appearing 76 year old man, NAD HEENT: Unremarkable Neck:  No JVD, no thyromegally Lymphatics:  No adenopathy Back:  No CVA tenderness Lungs:  Clear, with no wheezes, rales, or rhonchi. 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  DEVICE  Normal device function.  See PaceArt for details.   Assess/Plan: 1. Frequent PVCs - recent Holter monitor demonstrated that over a third of his heart beats were PVCs. Fortunately he is not symptomatic. We discussed the potential development of a PVC mediated cardiomyopathy. I've recommended initiation of amiodarone. He will take 200 mg daily. I would anticipate repeating his heart monitor in 4-5 months, to determine whether or not his PVCs have improved. 2. Aortic valve stenosis - he is status post replacement, with some residual stenosis. He is minimally if at all symptomatic with regard to heart failure. He will undergo watchful waiting. 3. Coronary artery disease - he denies anginal symptoms. No change in medications. 4. Chronic diastolic heart failure - his symptoms are class I. He will continue his current medications.  Cristopher Peru, M.D.

## 2016-11-27 NOTE — Patient Instructions (Addendum)
Medication Instructions:  Your physician has recommended you make the following change in your medication:  1.  Start amiodarone 200 mg (one tablet) by mouth daily.  Labwork: None ordered.  Testing/Procedures: Your physician has recommended that you wear a holter monitor. Holter monitors are medical devices that record the heart's electrical activity. Doctors most often use these monitors to diagnose arrhythmias. Arrhythmias are problems with the speed or rhythm of the heartbeat. The monitor is a small, portable device. You can wear one while you do your normal daily activities. This is usually used to diagnose what is causing palpitations/syncope (passing out).  Please schedule for a 24 hour holter monitor in 4-5 months.   Follow-Up: Your physician wants you to follow-up after your holter monitor results are complete:  5-6 months with Dr. Lovena Le.    Any Other Special Instructions Will Be Listed Below (If Applicable).     If you need a refill on your cardiac medications before your next appointment, please call your pharmacy.

## 2016-12-23 DIAGNOSIS — M21961 Unspecified acquired deformity of right lower leg: Secondary | ICD-10-CM | POA: Diagnosis not present

## 2016-12-23 DIAGNOSIS — D2371 Other benign neoplasm of skin of right lower limb, including hip: Secondary | ICD-10-CM | POA: Diagnosis not present

## 2017-01-04 ENCOUNTER — Telehealth: Payer: Self-pay | Admitting: Family Medicine

## 2017-01-04 NOTE — Telephone Encounter (Signed)
Copied from [place] #[id number]. Topic: Quick Communication - See Telephone Encounter >> Jan 04, 2017  4:11 PM Percell Belt A wrote: CRM for notification. See Telephone encounter for: Pt called in and would like to transfer him and his wife to the [place] location .  Pts live close to this location.  It is out of convenience that pts would like to transfer?  Is this ok with Dr Ronnald Ramp?  And if so pt needs tranfer appts for him and wife to [place]?     01/04/17.  Pt was seen here in office by Dr. Martinique on 01/12/2017.

## 2017-01-04 NOTE — Telephone Encounter (Signed)
Luis Herrera - Please advise if ok for pt to transfer. Thanks!

## 2017-01-10 NOTE — Telephone Encounter (Signed)
Yes, I am okay with this 

## 2017-01-12 ENCOUNTER — Encounter: Payer: Self-pay | Admitting: Family Medicine

## 2017-01-12 ENCOUNTER — Ambulatory Visit: Payer: Medicare Other | Admitting: Family Medicine

## 2017-01-12 ENCOUNTER — Ambulatory Visit (INDEPENDENT_AMBULATORY_CARE_PROVIDER_SITE_OTHER): Payer: Medicare Other | Admitting: Family Medicine

## 2017-01-12 VITALS — BP 136/77 | HR 88 | Temp 97.8°F | Resp 12 | Ht 71.0 in | Wt 224.1 lb

## 2017-01-12 DIAGNOSIS — I2510 Atherosclerotic heart disease of native coronary artery without angina pectoris: Secondary | ICD-10-CM | POA: Diagnosis not present

## 2017-01-12 DIAGNOSIS — I493 Ventricular premature depolarization: Secondary | ICD-10-CM | POA: Diagnosis not present

## 2017-01-12 DIAGNOSIS — I10 Essential (primary) hypertension: Secondary | ICD-10-CM | POA: Diagnosis not present

## 2017-01-12 DIAGNOSIS — E785 Hyperlipidemia, unspecified: Secondary | ICD-10-CM | POA: Diagnosis not present

## 2017-01-12 MED ORDER — ZOSTER VAC RECOMB ADJUVANTED 50 MCG/0.5ML IM SUSR: INTRAMUSCULAR | 1 refills | Status: DC

## 2017-01-12 NOTE — Progress Notes (Signed)
HPI:   [name] is a 76 y.o. male, who is here today to establish care.  Former PCP: Dr. 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   Hypertension:  Chronic. Currently on Amlodipine-Olmesartan 5-40 mg daily and Coreg 6.25 mg twice per day. History of cardiac arrhythmia, he is currently on Amiodarone 200 mg daily.  He follows with Dr Harrington Challenger every 6 months. + CAD Dx in 2009. CHF with diastolic dysfunction.  Lab Results  Component Value Date   TSH 3.180 10/08/2016    He is taking medications as instructed, no side effects reported.  He has not noted unusual headache, visual changes, exertional chest pain, dyspnea,  focal weakness, or edema.   Lab Results  Component Value Date   CREATININE 1.11 10/08/2016   BUN 23 10/08/2016   NA 141 10/08/2016   K 4.7 10/08/2016   CL 100 10/08/2016   CO2 26 10/08/2016     Hyperlipidemia:  Currently on Atorvastatin 80 mg and Zetia 10 mg daily. Following a low fat diet: Yes.  He has not noted side effects with medication.  Lab Results  Component Value Date   CHOL 130 10/08/2016   HDL 51 10/08/2016   LDLCALC 64 10/08/2016   TRIG 77 10/08/2016   CHOLHDL 2.5 10/08/2016    Concerns today: He would like to get the new shingles vaccine.   Review of Systems  Constitutional: Negative for activity change, appetite change, fatigue and fever.  HENT: Negative for nosebleeds, sore throat and trouble swallowing.   Eyes: Negative for redness and visual disturbance.  Respiratory: Negative for cough, shortness of breath and wheezing.   Cardiovascular: Negative for chest pain, palpitations and leg swelling.  Gastrointestinal: Negative for abdominal pain, nausea and vomiting.  Endocrine: Negative for cold intolerance, heat intolerance, polydipsia, polyphagia and polyuria.    Genitourinary: Negative for decreased urine volume, dysuria and hematuria.  Musculoskeletal: Negative for gait problem and myalgias.  Skin: Negative for pallor and rash.  Neurological: Negative for dizziness, syncope, weakness and headaches.    Current Outpatient Medications on File Prior to Visit  Medication Sig Dispense Refill  . amiodarone (PACERONE) 200 MG tablet Take 1 tablet (200 mg total) by mouth daily. 90 tablet 3  . amLODipine-olmesartan (AZOR) 5-40 MG tablet Take 1 tablet by mouth daily. 90 tablet 3  . aspirin 81 MG tablet Take 81 mg by mouth daily.    Marland Kitchen atorvastatin (LIPITOR) 80 MG tablet TAKE 1 TABLET (80 MG TOTAL) BY MOUTH DAILY. 90 tablet 2  . Calcium Carbonate-Vitamin D (CALCIUM-VITAMIN D) 500-200 MG-UNIT tablet Take 1 tablet by mouth daily.    . carvedilol (COREG) 6.25 MG tablet Take 1 tablet (6.25 mg total) by mouth 2 (two) times daily. 180 tablet 3  . ezetimibe (ZETIA) 10 MG tablet Take 1 tablet (10 mg total) by mouth daily. 30 tablet 11  . furosemide (LASIX) 40 MG tablet TAKE 1 TABLET BY MOUTH EVERY DAY . TAKE ADDITIONAL 40MG  AS NEEDED FOR SWELLING AND OR WEIGHT 180 tablet 2  . Multiple Vitamin (MULTIVITAMIN WITH MINERALS) TABS Take 1 tablet by mouth daily.     No current facility-administered medications on file prior to visit.      Past Medical History:  Diagnosis Date  . Aortic stenosis 2008  . Arthritis   . Blood transfusion   . Blood transfusion without reported diagnosis   .  CAD (coronary artery disease) 2008  . Complication of anesthesia    hallucinated once  . H/O hiatal hernia   . 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 Hx of colonic polyps   . Hyperlipidemia   . Hypertension    Allergies  Allergen Reactions  . Sulfamethoxazole-Trimethoprim Other (See Comments)    Carlyn Reichert Syndrome    Family History  Problem Relation Age of Onset  . Colon cancer Mother   . Heart attack Father   . Hypertension Other   . Hyperlipidemia  Other   . Heart attack Brother   . Hypertension Sister   . Hypertension Brother   . Stroke Neg Hx     Social History   Socioeconomic History  . Marital status: Married    Spouse name: None  . Number of children: None  . Years of education: None  . Highest education level: None  Social Needs  . Financial resource strain: None  . Food insecurity - worry: None  . Food insecurity - inability: None  . Transportation needs - medical: None  . Transportation needs - non-medical: None  Occupational History  . Occupation: retired  Tobacco Use  . Smoking status: Former Smoker    Last attempt to quit: 05/25/1973    Years since quitting: 43.6  . Smokeless tobacco: Never Used  Substance and Sexual Activity  . Alcohol use: No    Comment: rare  . Drug use: No  . Sexual activity: Not Currently  Other Topics Concern  . None  Social History Narrative   Regular exercise-yes. Pt lives in [place] with his wife.    Vitals:   01/12/17 1421  BP: 136/77  Pulse: 88  Resp: 12  Temp: 97.8 F (36.6 C)  SpO2: 96%    Body mass index is 31.26 kg/m.  Physical Exam  Nursing note and vitals reviewed. Constitutional: He is oriented to person, place, and time. He appears well-developed. No distress.  HENT:  Head: Normocephalic and atraumatic.  Mouth/Throat: Oropharynx is clear and moist and mucous membranes are normal. He has dentures.  Eyes: Conjunctivae are normal. Pupils are equal, round, and reactive to light.  Neck: No tracheal deviation present. No thyroid mass and no thyromegaly present.  Cardiovascular: Normal rate. An irregular rhythm present.  No murmur heard. Pulses:      Dorsalis pedis pulses are 2+ on the right side, and 2+ on the left side.  Respiratory: Effort normal and breath sounds normal. No respiratory distress.  GI: Soft. He exhibits no mass. There is no hepatomegaly. There is no tenderness.  Musculoskeletal: He exhibits edema (Trace pitting LE edema,  bilateral). He exhibits no tenderness.  Lymphadenopathy:    He has no cervical adenopathy.  Neurological: He is alert and oriented to person, place, and time. He has normal strength.  Stable gait with no assistance.  Skin: Skin is warm. No erythema.  Psychiatric: He has a normal mood and affect. Cognition and memory are normal.  Well groomed, good eye contact.     ASSESSMENT AND PLAN:   [name] was seen today for establish care.  Diagnoses and all orders for this visit:  Essential hypertension, benign  Adequately controlled. No changes in current management. DASH diet recommended. Eye exam recommended annually. F/U in 12 months, before if needed.  Hyperlipidemia with target LDL less than 130  Hx of CAD. Well controlled. Continue low-fat diet and current pharmacologic treatment. We will plan on  checking lipid panel in August 2019.  Frequent PVCs  Asymptomatic. Currently on Amiodarone 200 mg daily, some side effects discussed. Instructed about warning signs. Keep appointment with cardiologist in April 2019.  Other orders -     Zoster Vaccine Adjuvanted [facility]) injection; 0.5 ml in muscle and repeat in 8 weeks     Since he follows with cardiologist every 6 months, he would like to follow in 09/2017, when we will plan on doing fasting labs.           Shakyla Nolley G. Martinique, MD  [facility]. [place] office.

## 2017-01-12 NOTE — Patient Instructions (Addendum)
A few things to remember from today's visit:   Essential hypertension, benign  Hyperlipidemia with target LDL less than 130  I will see you back in 06/2016, before if needed.   Please be sure medication list is accurate. If a new problem present, please set up appointment sooner than planned today.

## 2017-01-13 NOTE — Telephone Encounter (Signed)
Dr. Martinique, okay for pt to transfer?

## 2017-01-27 DIAGNOSIS — D2372 Other benign neoplasm of skin of left lower limb, including hip: Secondary | ICD-10-CM | POA: Diagnosis not present

## 2017-01-27 DIAGNOSIS — D2371 Other benign neoplasm of skin of right lower limb, including hip: Secondary | ICD-10-CM | POA: Diagnosis not present

## 2017-02-19 ENCOUNTER — Other Ambulatory Visit: Payer: Self-pay | Admitting: Internal Medicine

## 2017-03-08 ENCOUNTER — Telehealth: Payer: Self-pay | Admitting: Internal Medicine

## 2017-03-08 MED ORDER — AMOXICILLIN 500 MG PO TABS: ORAL_TABLET | ORAL | 1 refills | Status: DC

## 2017-03-08 NOTE — Telephone Encounter (Signed)
Returned call to [place], @ Dr. Carrolyn Meiers office.  Spoke with Wannetta Sender and let her know that we just received clearance on pt this morning and there was several in front of him as well, that hopefully, we can get to it as soon as possible. Wannetta Sender thanked me for the update.

## 2017-03-08 NOTE — Telephone Encounter (Signed)
F/u Message  Pt call to f/u on clearance for dental work. Please call back to discuss

## 2017-03-08 NOTE — Telephone Encounter (Signed)
   Chart reviewed as part of pre-operative protocol coverage. Patient has history of bioprosthetic aortic valve replacement therefore requires SBE prophylaxis. No allergies to amoxicillin noted.   Callback staff, please advise that patient take amoxicillin 500mg  4 capsules 1 hour prior to dental cleaning (this equals 2000mg  total). If dentist office wants [place] to send it in, please find out which pharmacy patient wants it sent into. Regardless of this instance, please also offer for patient to have standing rx of #4 capsules with 1 refill on file to pharmacy of choice so we do not run into last-minute issues again.  Charlie Pitter, PA-C 03/08/2017, 2:47 PM

## 2017-03-08 NOTE — Telephone Encounter (Signed)
Spoke with pt to let him know that he would require SBE Prophylaxis, due to hx of bioprosthetic aortic valve replacement. Pt will take Amoxicillin 500 mg 4 tablets 1 hour before his procedure and we have sent it into his pharmacy with 1 refill for future.  Pt thanked me for the call an information.

## 2017-03-08 NOTE — Telephone Encounter (Signed)
New message   Call for [facility] at Dr Geoffry Paradise office [phone]  1. What dental office are you calling from? Dr Geoffry Paradise  2. What is your office phone and fax number? Fax [phone] ATTNColletta [place], phone [phone]  3. What type of procedure is the patient having performed? Cleaning   4. What date is procedure scheduled or is the patient there now? 03/09/17  5. What is your question (ex. Antibiotics prior to procedure, holding medication-we need to know how long dentist wants pt to hold med)? Pre antibiotic

## 2017-03-10 DIAGNOSIS — D2372 Other benign neoplasm of skin of left lower limb, including hip: Secondary | ICD-10-CM | POA: Diagnosis not present

## 2017-03-10 DIAGNOSIS — D2371 Other benign neoplasm of skin of right lower limb, including hip: Secondary | ICD-10-CM | POA: Diagnosis not present

## 2017-03-14 ENCOUNTER — Telehealth: Payer: Self-pay | Admitting: Physician Assistant

## 2017-03-14 NOTE — Telephone Encounter (Addendum)
77 y.o. male with a history of aortic valve replacement CAD status post single-vessel bypass at the time of his AVR, PVCs.  He was placed on amiodarone due to high PVC burden.  He called into the answering service today with noted elevated blood pressure and heart rate.  He went to [place] for a meeting yesterday.  He started to feel bad as he drove home.  Blood pressure was noted to be 166/77 with a heart rate of 104.  His heart rate today is in the 80s.  He notes some chills as well as a fever around 101.  He has had some slight dysuria.  He denies chest pain, shortness of breath, cough, vomiting, diarrhea, syncope.  He has felt somewhat lightheaded.  He admits to getting a flu shot this year. PLAN: #1.  If he continues to have a fever or feel poorly today, he can go to an urgent care today to evaluate viral illness versus UTI. #2.  Increase carvedilol to 9.375 twice daily #3.  Arrange follow-up this week with Dr. Harrington Challenger or APP #4.  If he does not go to urgent care, follow-up with PCP tomorrow to evaluate viral illness versus UTI #5.  If he feels worse, he should go to the emergency room Richardson Dopp, PA-C 03/14/2017 4:16 PM

## 2017-03-15 DIAGNOSIS — R350 Frequency of micturition: Secondary | ICD-10-CM | POA: Diagnosis not present

## 2017-03-15 DIAGNOSIS — R509 Fever, unspecified: Secondary | ICD-10-CM | POA: Diagnosis not present

## 2017-03-15 DIAGNOSIS — N390 Urinary tract infection, site not specified: Secondary | ICD-10-CM | POA: Diagnosis not present

## 2017-03-15 DIAGNOSIS — R631 Polydipsia: Secondary | ICD-10-CM | POA: Diagnosis not present

## 2017-03-15 NOTE — Telephone Encounter (Signed)
Called the patient and scheduled with Cecilie Kicks, NP for tomorrow.   He is feeling some better today.  Has not seen PCP.  HR has returned to 60s per his [employer].  Has not checked BP today.  Feeling achy and last week thought he may be getting the flu, had fever.  This has resolved.  He is appreciative for the appointment tomorrow.  Advised that if symptoms return he should contact PCP or go to urgent care.

## 2017-03-16 ENCOUNTER — Ambulatory Visit (INDEPENDENT_AMBULATORY_CARE_PROVIDER_SITE_OTHER): Payer: Medicare Other | Admitting: Physician Assistant

## 2017-03-16 ENCOUNTER — Encounter: Payer: Self-pay | Admitting: Cardiology

## 2017-03-16 VITALS — BP 126/50 | HR 74 | Resp 16 | Ht 71.0 in | Wt 226.4 lb

## 2017-03-16 DIAGNOSIS — I493 Ventricular premature depolarization: Secondary | ICD-10-CM | POA: Diagnosis not present

## 2017-03-16 DIAGNOSIS — R399 Unspecified symptoms and signs involving the genitourinary system: Secondary | ICD-10-CM | POA: Diagnosis not present

## 2017-03-16 DIAGNOSIS — E785 Hyperlipidemia, unspecified: Secondary | ICD-10-CM

## 2017-03-16 DIAGNOSIS — I25119 Atherosclerotic heart disease of native coronary artery with unspecified angina pectoris: Secondary | ICD-10-CM

## 2017-03-16 DIAGNOSIS — I5032 Chronic diastolic (congestive) heart failure: Secondary | ICD-10-CM

## 2017-03-16 MED ORDER — AMOXICILLIN-POT CLAVULANATE 875-125 MG PO TABS: 1.0000 | ORAL_TABLET | Freq: Two times a day (BID) | ORAL | 1 refills | Status: DC

## 2017-03-16 NOTE — Patient Instructions (Addendum)
Medication Instructions:  START AUGMENTIN 875 Twice per day for 14 days  -- If you need a refill on your cardiac medications before your next appointment, please call your pharmacy. --  Labwork: TSH; LFT  Testing/Procedures: None ordered  Follow-Up: Your physician wants you to follow-up in: 6 months with Dr. Theressa Stamps will receive a reminder letter in the mail two months in advance. If you don't receive a letter, please call our office to schedule the follow-up appointment.  Thank you for choosing [facility]!!    Any Other Special Instructions Will Be Listed Below (If Applicable).

## 2017-03-16 NOTE — Progress Notes (Signed)
Cardiology Office Note:    Date:  03/16/2017   ID:  Luis Herrera, DOB 12-31-1940, MRN [id number]  PCP:  Martinique, Betty G, MD  Cardiologist:  [name], MD   Referring MD: Martinique, Betty G, MD   Chief Complaint  Patient presents with  . Tachycardia    History of Present Illness:    Luis Herrera is a 77 y.o. male with a hx of CAD and AS s/p CABG x 1 and AVR (bioprosthetic valve, requires SBE prophylaxis). He also has chronic diastolic heart failure and HTN. He was noted to have frequent PVCs by holter and referred to EP (Dr. Lovena Le saw 11/27/16). He was started on amiodarone for asymptomatic PVCs in an attempt to prevent PVC-mediated cardiomyopathy. He called the answering service 03/14/17 with elevated pressure and overall feeling unwell. He was instructed to see either urgent care or his PCP and was set up for an appt to see [facility]. Coreg was increased to 9.375 mg BID.  Today, he presents for follow up. He was seen by [facility] yesterday with a UA positive for nitritres and WBC 14.5. He was prescribed cipro; however, this was not filled by pharmacy given his amiodarone. He was also given a shot of rocephin.  On my interview, he did not increase his coreg. He denies chest pain, palpitations, dizziness, lightheadedness, and feelings of syncope. He is not sleeping well, but this is his normal. He is feeling better after rocephin shot yesterday. He has no new complaints. He is scheduled for holter monitor.   In consultation with pharmacy, will prescribe augmentin 875 mg BID x 14 days for his UTI.   Past Medical History:  Diagnosis Date  . Aortic stenosis 2008  . Arthritis   . Blood transfusion   . Blood transfusion without reported diagnosis   . CAD (coronary artery disease) 2008  . Complication of anesthesia    hallucinated once  . H/O hiatal hernia   . 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 Hx of colonic polyps   . Hyperlipidemia   . Hypertension     Past  Surgical History:  Procedure Laterality Date  . AORTIC VALVE REPLACEMENT  2009   Bioprosthetic at [place]  2008  . CHOLECYSTECTOMY  1982  . COLONOSCOPY    . CORONARY ARTERY BYPASS GRAFT  2009   SVG-PDA w/ AVR  . CORRECTION HAMMER TOE  2011   BILATERAL  . 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 POLYPECTOMY    . TONSILLECTOMY    . TOTAL HIP ARTHROPLASTY Left 2000  . TOTAL KNEE ARTHROPLASTY  2000   left  . TOTAL KNEE ARTHROPLASTY  12/01/2011   Procedure: TOTAL KNEE ARTHROPLASTY;  Surgeon: Mauri Pole, MD;  Location: [facility];  Service: Orthopedics;  Laterality: Right;  . WEIL OSTEOTOMY  05/28/2011    Current Medications: Current Meds  Medication Sig  . amiodarone (PACERONE) 200 MG tablet Take 1 tablet (200 mg total) by mouth daily.  Marland Kitchen amLODipine-olmesartan (AZOR) 5-40 MG tablet Take 1 tablet by mouth daily.  Marland Kitchen amoxicillin (AMOXIL) 500 MG tablet Take 4 tablets 1 hour before your dental procedure  . aspirin 81 MG tablet Take 81 mg by mouth daily.  Marland Kitchen atorvastatin (LIPITOR) 80 MG tablet TAKE 1 TABLET (80 MG TOTAL) BY MOUTH DAILY.  . Calcium Carbonate-Vitamin D (CALCIUM-VITAMIN D) 500-200 MG-UNIT tablet Take 1 tablet by mouth daily.  . carvedilol (  COREG) 6.25 MG tablet Take 1 tablet (6.25 mg total) by mouth 2 (two) times daily.  . ciprofloxacin (CIPRO) 250 MG tablet Take 250 mg by mouth 2 (two) times daily.  Marland Kitchen ezetimibe (ZETIA) 10 MG tablet TAKE 1 TABLET (10 MG TOTAL) BY MOUTH DAILY.  . furosemide (LASIX) 40 MG tablet TAKE 1 TABLET BY MOUTH EVERY DAY . TAKE ADDITIONAL 40MG  AS NEEDED FOR SWELLING AND OR WEIGHT  . Multiple Vitamin (MULTIVITAMIN WITH MINERALS) TABS Take 1 tablet by mouth daily.  Marland Kitchen Zoster Vaccine Adjuvanted [facility]) injection 0.5 ml in muscle and repeat in 8 weeks     Allergies:   Sulfamethoxazole-trimethoprim   Social History   Socioeconomic History  . Marital status: Married    Spouse name: None  .  Number of children: None  . Years of education: None  . Highest education level: None  Social Needs  . Financial resource strain: None  . Food insecurity - worry: None  . Food insecurity - inability: None  . Transportation needs - medical: None  . Transportation needs - non-medical: None  Occupational History  . Occupation: retired  Tobacco Use  . Smoking status: Former Smoker    Last attempt to quit: 05/25/1973    Years since quitting: 43.8  . Smokeless tobacco: Never Used  Substance and Sexual Activity  . Alcohol use: No    Comment: rare  . Drug use: No  . Sexual activity: Not Currently  Other Topics Concern  . None  Social History Narrative   Regular exercise-yes. Pt lives in [place] with his wife.     Family History: The patient's family history includes Colon cancer in his mother; Heart attack in his brother and father; Hyperlipidemia in his other; Hypertension in his brother, other, and sister. There is no history of Stroke.  ROS:   Please see the history of present illness.    All other systems reviewed and are negative.  EKGs/Labs/Other Studies Reviewed:    The following studies were reviewed today:  Echo 03/10/16: Study Conclusions - Left ventricle: The cavity size was normal. Wall thickness was   increased in a pattern of mild LVH. Systolic function was   vigorous. The estimated ejection fraction was in the range of 65%   to 70%. Wall motion was normal; there were no regional wall   motion abnormalities. Doppler parameters are consistent with   abnormal left ventricular relaxation (grade 1 diastolic   dysfunction). The E/e&' ratio is between 8-15, suggesting   indeterminate LV filling pressure. - Aortic valve: Bioprosthetic AVR, no obstruction. There was   trivial regurgitation. Mean gradient (S): 17 mm Hg. Peak gradient   (S): 30 mm Hg. - Mitral valve: Calcified annulus. Mildly thickened leaflets .   There was trivial regurgitation. - Left  atrium: Severely dilated. - Right ventricle: The cavity size was mildly dilated. Systolic   function is reduced. - Right atrium: The atrium was mildly dilated. - Inferior vena cava: The vessel was normal in size. The   respirophasic diameter changes were in the normal range (>= 50%),   consistent with normal central venous pressure.  Impressions: - Compared to a prior study in 05/2015, the LVEF is higher at   65-70%. The aortic bioprosthesis is working normally.   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 There were frequent PVCs including ventricular couplets and bigeminal PVCs.  There is a defect present in the basal inferior,  mid inferior and apical inferior location. This defect is fixed and likely related to diaphragmatic attenuation artifact but cannot rule out infarct. No ischemia noted. No evidence of ischemia  Diaphragmatic attenuation LV function is not accurately calculated due to attenuation.   EKG:  EKG is ordered today.  The ekg ordered today demonstrates sinus rhythm with RBBB (not new)  Recent Labs: 10/08/2016: ALT 17; BUN 23; Creatinine, Ser 1.11; Hemoglobin 13.6; Platelets 196; Potassium 4.7; Sodium 141; TSH 3.180  Recent Lipid Panel    Component Value Date/Time   CHOL 130 10/08/2016 1119   TRIG 77 10/08/2016 1119   HDL 51 10/08/2016 1119   CHOLHDL 2.5 10/08/2016 1119   CHOLHDL 3 11/14/2015 0954   VLDL 16.8 11/14/2015 0954   LDLCALC 64 10/08/2016 1119    Physical Exam:    VS:  BP (!) 126/50   Pulse 74   Resp 16   Ht 5\' 11"  (1.803 m)   Wt 226 lb 6.4 oz (102.7 kg)   SpO2 97%   BMI 31.58 kg/m     Wt Readings from Last 3 Encounters:  03/16/17 226 lb 6.4 oz (102.7 kg)  01/12/17 224 lb 2 oz (101.7 kg)  11/27/16 220 lb (99.8 kg)     GEN: Well nourished, well developed in no acute distress HEENT: Normal NECK: No JVD; No carotid bruits LYMPHATICS: No lymphadenopathy CARDIAC: RRR, no murmurs, rubs,  gallops RESPIRATORY:  Clear to auscultation without rales, wheezing or rhonchi  ABDOMEN: Soft, non-tender, non-distended MUSCULOSKELETAL:  No edema; No deformity  SKIN: Warm and dry NEUROLOGIC:  Alert and oriented x 3 PSYCHIATRIC:  Normal affect    ASSESSMENT and PLAN:    1. CAD s/p CABG x 1 (2008) - continue ASA. lipitor - no chest pain - no changes in medications   2. HTN - continue coreg - taking 6.25 - continue norvasc-olmesartan - pressures today is well controlled   3. Chronic diastolic heart failure - denies SOB, DOE, and orthopnea - continue 40 mg lasix daily   4. Frequent PVCs - on amiodarone since 11/2016 - check TSH and LFTs today   5. HLD - 10/08/2016: Cholesterol, Total 130; HDL 51; LDL Calculated 64; Triglycerides 77 - LDL at goal - continue lipitor - checking LFTs today    Medication Adjustments/Labs and Tests Ordered: Current medicines are reviewed at length with the patient today.  Concerns regarding medicines are outlined above.  Orders Placed This Encounter  Procedures  . Thyroid Panel With TSH  . Hepatic function panel   Meds ordered this encounter  Medications  . amoxicillin-clavulanate (AUGMENTIN) 875-125 MG tablet    Sig: Take 1 tablet by mouth 2 (two) times daily.    Dispense:  16 tablet    Refill:  1    Signed, Ledora Bottcher, [place]  03/16/2017 12:59 PM    [facility]

## 2017-03-17 LAB — HEPATIC FUNCTION PANEL
ALBUMIN: 3.7 g/dL (ref 3.5–4.8)
ALK PHOS: 79 IU/L (ref 39–117)
ALT: 25 IU/L (ref 0–44)
AST: 27 IU/L (ref 0–40)
BILIRUBIN TOTAL: 0.9 mg/dL (ref 0.0–1.2)
BILIRUBIN, DIRECT: 0.29 mg/dL (ref 0.00–0.40)
Total Protein: 6.9 g/dL (ref 6.0–8.5)

## 2017-03-17 LAB — THYROID PANEL WITH TSH
Free Thyroxine Index: 2 (ref 1.2–4.9)
T3 UPTAKE RATIO: 27 % (ref 24–39)
T4, Total: 7.3 ug/dL (ref 4.5–12.0)
TSH: 5.21 u[IU]/mL — ABNORMAL HIGH (ref 0.450–4.500)

## 2017-03-17 NOTE — Addendum Note (Signed)
Addended by: Valere Dross on: 03/17/2017 01:39 PM   Modules accepted: Orders

## 2017-03-18 NOTE — Addendum Note (Signed)
Addended by: Isaiah Serge on: 03/18/2017 01:47 PM   Modules accepted: Orders

## 2017-03-24 ENCOUNTER — Other Ambulatory Visit: Payer: Self-pay | Admitting: *Deleted

## 2017-03-24 ENCOUNTER — Other Ambulatory Visit: Payer: Self-pay | Admitting: Internal Medicine

## 2017-03-24 DIAGNOSIS — Z79899 Other long term (current) drug therapy: Secondary | ICD-10-CM

## 2017-03-24 NOTE — Progress Notes (Signed)
Results released to patient via MyChart. Orders placed for repeat thyroid panel w/ TSH for in 3 months.  Will follow to confirm patient schedules.

## 2017-03-24 NOTE — Progress Notes (Signed)
Free T3, Free T4 normal TSH is minimally off Keep on same meds  F/U thyroid function like this in 3 months ------  Notes recorded by Ledora Bottcher, PA on 03/22/2017 at 9:33 AM EST Pt was started on amiodarone by Dr. Lovena Le 11/2016 for frequent PVCs. I saw him in follow up. TSH is now elevated. Can you please advise the patient if he needs to stop taking amiodarone? Result was also sent to PCP.

## 2017-04-09 ENCOUNTER — Other Ambulatory Visit: Payer: Self-pay | Admitting: Internal Medicine

## 2017-04-22 DIAGNOSIS — D2371 Other benign neoplasm of skin of right lower limb, including hip: Secondary | ICD-10-CM | POA: Diagnosis not present

## 2017-04-22 DIAGNOSIS — D2372 Other benign neoplasm of skin of left lower limb, including hip: Secondary | ICD-10-CM | POA: Diagnosis not present

## 2017-05-19 ENCOUNTER — Ambulatory Visit (INDEPENDENT_AMBULATORY_CARE_PROVIDER_SITE_OTHER): Payer: Medicare Other

## 2017-05-19 DIAGNOSIS — I493 Ventricular premature depolarization: Secondary | ICD-10-CM

## 2017-05-21 ENCOUNTER — Encounter: Payer: Self-pay | Admitting: Internal Medicine

## 2017-05-21 ENCOUNTER — Ambulatory Visit (INDEPENDENT_AMBULATORY_CARE_PROVIDER_SITE_OTHER): Payer: Medicare Other | Admitting: Internal Medicine

## 2017-05-21 VITALS — BP 169/68 | HR 58 | Ht 71.0 in | Wt 231.0 lb

## 2017-05-21 DIAGNOSIS — I493 Ventricular premature depolarization: Secondary | ICD-10-CM

## 2017-05-21 DIAGNOSIS — I10 Essential (primary) hypertension: Secondary | ICD-10-CM | POA: Diagnosis not present

## 2017-05-21 DIAGNOSIS — I25119 Atherosclerotic heart disease of native coronary artery with unspecified angina pectoris: Secondary | ICD-10-CM

## 2017-05-21 MED ORDER — HYDRALAZINE HCL 25 MG PO TABS: 25.0000 mg | ORAL_TABLET | Freq: Two times a day (BID) | ORAL | 3 refills | Status: DC

## 2017-05-21 NOTE — Progress Notes (Addendum)
HPI Mr. Delker returns today for evaluation of PVC's. The patient is a pleasant 77 yo man with PVC's and HTN who was found to have almost 40K PVC's in 24 hours. He was seen by me several months ago and after discussion of the treatment options was placed on amiodarone. He has had a marked reduction in PVC's to under 10K a day. He feels well. He was noted to have and elevated TSH with a normal T4. He does not have palpitations, chest pain or sob. His blood pressures have been elevated. Allergies  Allergen Reactions  . Sulfamethoxazole-Trimethoprim Other (See Comments)    Carlyn Reichert Syndrome     Current Outpatient Medications  Medication Sig Dispense Refill  . amiodarone (PACERONE) 200 MG tablet Take 1 tablet (200 mg total) by mouth daily. 90 tablet 3  . amLODipine-olmesartan (AZOR) 5-40 MG tablet Take 1 tablet by mouth daily. 90 tablet 3  . aspirin 81 MG tablet Take 81 mg by mouth daily.    Marland Kitchen atorvastatin (LIPITOR) 80 MG tablet TAKE 1 TABLET (80 MG TOTAL) BY MOUTH DAILY. 90 tablet 2  . Calcium Carbonate-Vitamin D (CALCIUM-VITAMIN D) 500-200 MG-UNIT tablet Take 1 tablet by mouth daily.    . carvedilol (COREG) 6.25 MG tablet TAKE 1 TABLET (6.25 MG TOTAL) BY MOUTH 2 (TWO) TIMES DAILY. 180 tablet 3  . ezetimibe (ZETIA) 10 MG tablet TAKE 1 TABLET (10 MG TOTAL) BY MOUTH DAILY. 30 tablet 8  . furosemide (LASIX) 40 MG tablet TAKE 1 TABLET BY MOUTH EVERY DAY . TAKE ADDITIONAL 40MG  AS NEEDED FOR SWELLING AND OR WEIGHT 180 tablet 2  . Multiple Vitamin (MULTIVITAMIN WITH MINERALS) TABS Take 1 tablet by mouth daily.    . hydrALAZINE (APRESOLINE) 25 MG tablet Take 1 tablet (25 mg total) by mouth 2 (two) times daily. 180 tablet 3   No current facility-administered medications for this visit.      Past Medical History:  Diagnosis Date  . Aortic stenosis 2008  . Arthritis   . Blood transfusion   . Blood transfusion without reported diagnosis   . CAD (coronary artery disease) 2008  .  Complication of anesthesia    hallucinated once  . H/O hiatal hernia   . 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 Hx of colonic polyps   . Hyperlipidemia   . Hypertension     ROS:   All systems reviewed and negative except as noted in the HPI.   Past Surgical History:  Procedure Laterality Date  . AORTIC VALVE REPLACEMENT  2009   Bioprosthetic at [place]  2008  . CHOLECYSTECTOMY  1982  . COLONOSCOPY    . CORONARY ARTERY BYPASS GRAFT  2009   SVG-PDA w/ AVR  . CORRECTION HAMMER TOE  2011   BILATERAL  . 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 POLYPECTOMY    . TONSILLECTOMY    . TOTAL HIP ARTHROPLASTY Left 2000  . TOTAL KNEE ARTHROPLASTY  2000   left  . TOTAL KNEE ARTHROPLASTY  12/01/2011   Procedure: TOTAL KNEE ARTHROPLASTY;  Surgeon: Mauri Pole, MD;  Location: [facility];  Service: Orthopedics;  Laterality: Right;  . WEIL OSTEOTOMY  05/28/2011     Family History  Problem Relation Age of Onset  . Colon cancer Mother   . Heart attack Father   . Hypertension Other   . Hyperlipidemia Other   . Heart  attack Brother   . Hypertension Sister   . Hypertension Brother   . Stroke Neg Hx      Social History   Socioeconomic History  . Marital status: Married    Spouse name: Not on file  . Number of children: Not on file  . Years of education: Not on file  . Highest education level: Not on file  Occupational History  . Occupation: retired  Scientific laboratory technician  . Financial resource strain: Not on file  . Food insecurity:    Worry: Not on file    Inability: Not on file  . Transportation needs:    Medical: Not on file    Non-medical: Not on file  Tobacco Use  . Smoking status: Former Smoker    Last attempt to quit: 05/25/1973    Years since quitting: 44.0  . Smokeless tobacco: Never Used  Substance and Sexual Activity  . Alcohol use: No    Comment: rare  . Drug use: No  . Sexual activity: Not  Currently  Lifestyle  . Physical activity:    Days per week: Not on file    Minutes per session: Not on file  . Stress: Not on file  Relationships  . Social connections:    Talks on phone: Not on file    Gets together: Not on file    Attends religious service: Not on file    Active member of club or organization: Not on file    Attends meetings of clubs or organizations: Not on file    Relationship status: Not on file  . Intimate partner violence:    Fear of current or ex partner: Not on file    Emotionally abused: Not on file    Physically abused: Not on file    Forced sexual activity: Not on file  Other Topics Concern  . Not on file  Social History Narrative   Regular exercise-yes. Pt lives in [place] with his wife.     BP (!) 169/68   Pulse (!) 58   Ht 5\' 11"  (1.803 m)   Wt 231 lb (104.8 kg)   BMI 32.22 kg/m   Physical Exam:  Well appearing 77 yo man, NAD HEENT: Unremarkable Neck:  6 cm JVD, no thyromegally Lymphatics:  No adenopathy Back:  No CVA tenderness Lungs:  Clear with no wheezes HEART:  Regular rate rhythm, no murmurs, no rubs, no clicks Abd:  soft, positive bowel sounds, no organomegally, no rebound, no guarding Ext:  2 plus pulses, no edema, no cyanosis, no clubbing Skin:  No rashes no nodules Neuro:  CN II through XII intact, motor grossly intact  EKG - sinus bradycardia with RBBB and PVC's  Assess/Plan: 1. PVC's - his PVC density is much improved on amiodarone. He will continue 200 mg daily.  2. HTN - his blood pressure is not well controlled. I have asked him to start taking hydralazine 25 bid with plans for uptitration. His bradycardia limits his dose of beta blocker 3. CAD - he is s/p CABG. He will continue his current meds. No angina. 4. Elevated TSH - his is not clinically hypothyroid and his T4 is normal. We will recheck in a couple of months.  Mikle Bosworth.D.

## 2017-05-21 NOTE — Patient Instructions (Addendum)
Medication Instructions:  Your physician has recommended you make the following change in your medication:  1.  Start taking hydralazine 25 mg one tablet by mouth twice a day.  Labwork: None ordered.  Testing/Procedures: None ordered.  Follow-Up: Your physician wants you to follow-up in: one year with Dr. Lovena Le.  You will receive a reminder letter in the mail two months in advance. If you don't receive a letter, please call our office to schedule the follow-up appointment.  Any Other Special Instructions Will Be Listed Below (If Applicable).  If you need a refill on your cardiac medications before your next appointment, please call your pharmacy.

## 2017-05-27 DIAGNOSIS — D2371 Other benign neoplasm of skin of right lower limb, including hip: Secondary | ICD-10-CM | POA: Diagnosis not present

## 2017-05-31 NOTE — Addendum Note (Signed)
Addended by: Marlis Edelson C on: 05/31/2017 11:02 AM   Modules accepted: Orders

## 2017-06-07 ENCOUNTER — Telehealth: Payer: Self-pay | Admitting: Internal Medicine

## 2017-06-07 NOTE — Telephone Encounter (Signed)
I advised patient to avoid grapefruit due to taking amiodarone. Patient verbalized understanding and thankful for the call.

## 2017-06-07 NOTE — Telephone Encounter (Signed)
New Message:      Pt states he would like to know if grapefruits are good for you to eat? He states he heard it was not good for pt's that have high blood pressure.

## 2017-06-21 DIAGNOSIS — D2371 Other benign neoplasm of skin of right lower limb, including hip: Secondary | ICD-10-CM | POA: Diagnosis not present

## 2017-07-05 ENCOUNTER — Encounter: Payer: Self-pay | Admitting: Internal Medicine

## 2017-07-06 ENCOUNTER — Encounter: Payer: Self-pay | Admitting: Internal Medicine

## 2017-07-15 ENCOUNTER — Encounter: Payer: Self-pay | Admitting: Internal Medicine

## 2017-07-26 ENCOUNTER — Telehealth: Payer: Self-pay | Admitting: Internal Medicine

## 2017-07-26 NOTE — Telephone Encounter (Addendum)
Patient concerned about bilat leg edema recently. Thinks may be from hydralazine. Also taking amlodipine. (AZOR) BPs at home are running 130s/ No other symptoms. Advised to monitor sodium in diet. Advised I will review with pharmacy and call him back tomorrow He is in agreement with this. Also would prefer to move his appointment up with Dr. Harrington Challenger.

## 2017-07-26 NOTE — Telephone Encounter (Signed)
New message   Patient requesting to speak with Michalene. Patient declined to discuss reason. Please call

## 2017-07-27 NOTE — Telephone Encounter (Signed)
If taking combo then d/c amlodipine portion  Agree, follow exam and BP

## 2017-07-27 NOTE — Telephone Encounter (Signed)
Reviewed with Tana Coast, PharmD. She recommends patient discontinue amlodipine to see if lower extremity edema resolves. Will route to Dr. Harrington Challenger for review/orders for any medication adjustments.  Rescheduled patient for visit on 08/10/17.

## 2017-07-30 MED ORDER — OLMESARTAN MEDOXOMIL 40 MG PO TABS: 40.0000 mg | ORAL_TABLET | Freq: Every day | ORAL | 3 refills | Status: DC

## 2017-07-30 NOTE — Telephone Encounter (Signed)
Patient will stop azor although he thinks swelling might be a little better. In am legs are normal, no swelling.  As day goes on, worsens. BP today was 129/73 Pt to continue to monitor BP and is aware of f/u on 08/10/17.

## 2017-08-03 ENCOUNTER — Telehealth: Payer: Self-pay | Admitting: Internal Medicine

## 2017-08-03 ENCOUNTER — Telehealth: Payer: Self-pay | Admitting: Family Medicine

## 2017-08-03 DIAGNOSIS — I10 Essential (primary) hypertension: Secondary | ICD-10-CM

## 2017-08-03 MED ORDER — OLMESARTAN MEDOXOMIL 40 MG PO TABS: 40.0000 mg | ORAL_TABLET | Freq: Every day | ORAL | 1 refills | Status: DC

## 2017-08-03 MED ORDER — HYDRALAZINE HCL 25 MG PO TABS: 25.0000 mg | ORAL_TABLET | Freq: Two times a day (BID) | ORAL | 1 refills | Status: DC

## 2017-08-03 MED ORDER — AMIODARONE HCL 200 MG PO TABS: 200.0000 mg | ORAL_TABLET | Freq: Every day | ORAL | 1 refills | Status: DC

## 2017-08-03 MED ORDER — FUROSEMIDE 40 MG PO TABS: ORAL_TABLET | ORAL | 1 refills | Status: DC

## 2017-08-03 MED ORDER — EZETIMIBE 10 MG PO TABS: 10.0000 mg | ORAL_TABLET | Freq: Every day | ORAL | 1 refills | Status: DC

## 2017-08-03 MED ORDER — ATORVASTATIN CALCIUM 80 MG PO TABS
ORAL_TABLET | ORAL | 1 refills | Status: DC
Start: 2017-08-03 — End: 2018-05-16

## 2017-08-03 MED ORDER — CARVEDILOL 6.25 MG PO TABS: 6.2500 mg | ORAL_TABLET | Freq: Two times a day (BID) | ORAL | 1 refills | Status: DC

## 2017-08-03 NOTE — Telephone Encounter (Signed)
Pt's medication was sent to pt's pharmacy as requested. Confirmation received.  °

## 2017-08-03 NOTE — Telephone Encounter (Signed)
New message     *STAT* If patient is at the pharmacy, call can be transferred to refill team.   1. Which medications need to be refilled? (please list name of each medication and dose if known) amiodarone (PACERONE) 200 MG tablet, atorvastatin (LIPITOR) 80 MG tablet, carvedilol (COREG) 6.25 MG tablet, ezetimibe (ZETIA) 10 MG tablet, furosemide (LASIX) 40 MG tablet, hydrALAZINE (APRESOLINE) 25 MG tablet,  olmesartan (BENICAR) 40 MG tablet 2. Which pharmacy/location (including street and city if local pharmacy) is medication to be sent to? [facility]- phone [phone], fax [phone]  3. Do they need a 30 day or 90 day supply? [place]

## 2017-08-03 NOTE — Telephone Encounter (Signed)
Left message to return call to clinic concerning medications.

## 2017-08-03 NOTE — Telephone Encounter (Signed)
Spoke with patient and he stated that he wanted to make sure his wife Rx's were sent to [facility] from now on.

## 2017-08-03 NOTE — Telephone Encounter (Unsigned)
Copied from [place] [id number]. Topic: Quick Communication - See Telephone Encounter >> Aug 03, 2017  1:01 PM Neva Seat wrote: Pt needing to discuss the Rx's Dr. Martinique prescribes him. Please call pt to verify and discuss.

## 2017-08-10 ENCOUNTER — Encounter: Payer: Self-pay | Admitting: Internal Medicine

## 2017-08-10 ENCOUNTER — Ambulatory Visit: Payer: Medicare Other | Admitting: Internal Medicine

## 2017-08-10 ENCOUNTER — Ambulatory Visit (INDEPENDENT_AMBULATORY_CARE_PROVIDER_SITE_OTHER): Payer: Medicare Other | Admitting: Internal Medicine

## 2017-08-10 VITALS — BP 130/54 | HR 67 | Ht 71.0 in | Wt 223.0 lb

## 2017-08-10 DIAGNOSIS — I10 Essential (primary) hypertension: Secondary | ICD-10-CM

## 2017-08-10 DIAGNOSIS — E785 Hyperlipidemia, unspecified: Secondary | ICD-10-CM

## 2017-08-10 DIAGNOSIS — I25119 Atherosclerotic heart disease of native coronary artery with unspecified angina pectoris: Secondary | ICD-10-CM

## 2017-08-10 DIAGNOSIS — R350 Frequency of micturition: Secondary | ICD-10-CM

## 2017-08-10 DIAGNOSIS — I5032 Chronic diastolic (congestive) heart failure: Secondary | ICD-10-CM

## 2017-08-10 DIAGNOSIS — Z79899 Other long term (current) drug therapy: Secondary | ICD-10-CM | POA: Diagnosis not present

## 2017-08-10 DIAGNOSIS — I493 Ventricular premature depolarization: Secondary | ICD-10-CM

## 2017-08-10 NOTE — Progress Notes (Signed)
Cardiology Office Note   Date:  08/10/2017   ID:  Luis Herrera, DOB 09/09/1940, MRN [id number]  PCP:  Martinique, Betty G, MD  Cardiologist:   [name], MD   F?U of AV dz and HTN      History of Present Illness: Luis Herrera is a 77 y.o. male with a history of  AV disease (s/p AVR), CAD(s/p SVG to PDA), HTN.He was  previously followed by Madelaine Etienne at [facility]. Pt also had a hsitory of PVCs Echo in Jan 2018 LVEF normal  AV prosthesis was working well Holter showed 13% PVCs  REcomm increasing coreg to 6.25 bid  I saw him in the fall.   He had continued PVCs   Holer showed 38% PVCs with 4 beat run NSVT  He underwent myovue scan that showed no ischemia   He was seen by G taylor in the fall of 2018 and amiodarone added t oregimen     Repeat holter monitor in April showed 9% PVCs  The pt was seen by [name] this spring   Plan to continue amiodarone   BP was elevated and he recomm hydralazine  The pt had some increased LE edeam   It was recomm he come of amlodipine (part of Azor that he was on)  Also reocmm he watch salt  Since then he says his breathing is OK   He denies palpitations. He says he has always had some swelling in L leg     Cardiology Office Note   Date:  08/10/2017   ID:  Luis Herrera, DOB July 17, 1940, MRN [id number]  PCP:  Martinique, Betty G, MD  Cardiologist:   [name], MD       History of Present Illness: Luis Herrera is a 77 y.o. male with a history of       Current Meds  Medication Sig  . amiodarone (PACERONE) 200 MG tablet Take 1 tablet (200 mg total) by mouth daily.  Marland Kitchen aspirin 81 MG tablet Take 81 mg by mouth daily.  Marland Kitchen atorvastatin (LIPITOR) 80 MG tablet TAKE 1 TABLET (80 MG TOTAL) BY MOUTH DAILY.  . Calcium Carbonate-Vitamin D (CALCIUM-VITAMIN D) 500-200 MG-UNIT tablet Take 1 tablet by mouth daily.  . carvedilol (COREG) 6.25 MG tablet Take 1 tablet (6.25 mg total) by mouth 2 (two) times daily.  Marland Kitchen ezetimibe (ZETIA) 10 MG tablet Take 1 tablet (10  mg total) by mouth daily.  . furosemide (LASIX) 40 MG tablet TAKE 1 TABLET BY MOUTH EVERY DAY . TAKE ADDITIONAL 40MG  AS NEEDED FOR SWELLING AND OR WEIGHT  . hydrALAZINE (APRESOLINE) 25 MG tablet Take 1 tablet (25 mg total) by mouth 2 (two) times daily.  . Multiple Vitamin (MULTIVITAMIN WITH MINERALS) TABS Take 1 tablet by mouth daily.  Marland Kitchen olmesartan (BENICAR) 40 MG tablet Take 1 tablet (40 mg total) by mouth daily.     Allergies:   Sulfamethoxazole-trimethoprim   Past Medical History:  Diagnosis Date  . Aortic stenosis 2008  . Arthritis   . Blood transfusion   . Blood transfusion without reported diagnosis   . CAD (coronary artery disease) 2008  . Complication of anesthesia    hallucinated once  . H/O hiatal hernia   . 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 Hx of colonic polyps   . Hyperlipidemia   . Hypertension     Past Surgical History:  Procedure Laterality Date  . AORTIC VALVE REPLACEMENT  2009   Bioprosthetic  at [place]  2008  . CHOLECYSTECTOMY  1982  . COLONOSCOPY    . CORONARY ARTERY BYPASS GRAFT  2009   SVG-PDA w/ AVR  . CORRECTION HAMMER TOE  2011   BILATERAL  . 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 POLYPECTOMY    . TONSILLECTOMY    . TOTAL HIP ARTHROPLASTY Left 2000  . TOTAL KNEE ARTHROPLASTY  2000   left  . TOTAL KNEE ARTHROPLASTY  12/01/2011   Procedure: TOTAL KNEE ARTHROPLASTY;  Surgeon: Mauri Pole, MD;  Location: [facility];  Service: Orthopedics;  Laterality: Right;  . WEIL OSTEOTOMY  05/28/2011     Social History:  The patient  reports that he quit smoking about 44 years ago. He has never used smokeless tobacco. He reports that he does not drink alcohol or use drugs.   Family History:  The patient's family history includes Colon cancer in his mother; Heart attack in his brother and father; Hyperlipidemia in his other; Hypertension in his brother, other, and sister.    ROS:   Please see the history of present illness. All other systems are reviewed and  Negative to the above problem except as noted.    PHYSICAL EXAM: VS:  BP (!) 130/54   Pulse 67   Ht 5\' 11"  (1.803 m)   Wt 101.2 kg (223 lb)   SpO2 96%   BMI 31.10 kg/m   GEN: Obese 77 yo , in no acute distress  HEENT: normal  Neck: no JVD, carotid bruits, or masses Cardiac: RRR; no murmurs, rubs, or gallops, Tr LE edema  Respiratory:  clear to auscultation bilaterally, normal work of breathing GI: soft, nontender, nondistended, + BS  No hepatomegaly  MS: no deformity Moving all extremities   Skin: warm and dry, no rash Neuro:  Strength and sensation are intact Psych: euthymic mood, full affect   EKG:  EKG is not ordered today.   Lipid Panel    Component Value Date/Time   CHOL 130 10/08/2016 1119   TRIG 77 10/08/2016 1119   HDL 51 10/08/2016 1119   CHOLHDL 2.5 10/08/2016 1119   CHOLHDL 3 11/14/2015 0954   VLDL 16.8 11/14/2015 0954   LDLCALC 64 10/08/2016 1119      Wt Readings from Last 3 Encounters:  08/10/17 101.2 kg (223 lb)  05/21/17 104.8 kg (231 lb)  03/16/17 102.7 kg (226 lb 6.4 oz)      ASSESSMENT AND PLAN:  1  AV dz.  S/p replacement   Last echo in January 2018 bioprosthesis was working well    2   PVCs   Continue amiodarone   Will check TSH   Has f/u in EP next winter    3  HL Excellent control of lipids  4.  HTN  BP is improved on current regimen  Follow  5  CAD  (s/p CABG SVG to PDA)  No symptoms of angina  Myovue last fall neg for ischemia     Current medicines are reviewed at length with the patient today.  The patient does not have concerns regarding medicines.  Signed, [name], MD  08/10/2017 11:50 PM    [facility] [address], [place], [place]  [address] Phone: [phone]; Fax: [phone]   Pt notes intermitt dizziness  No palpitatisons  Rare CP  At night More like indigestion Breathing is OK    Outpatient Medications  Prior to Visit  Medication Sig Dispense Refill  . amiodarone (PACERONE) 200 MG tablet Take 1 tablet (200 mg total) by mouth daily. 90 tablet 1  . aspirin 81 MG tablet Take 81 mg by mouth daily.    Marland Kitchen atorvastatin (LIPITOR) 80 MG tablet TAKE 1 TABLET (80 MG TOTAL) BY MOUTH DAILY. 90 tablet 1  . Calcium Carbonate-Vitamin D (CALCIUM-VITAMIN D) 500-200 MG-UNIT tablet Take 1 tablet by mouth daily.    . carvedilol (COREG) 6.25 MG tablet Take 1 tablet (6.25 mg total) by mouth 2 (two) times daily. 180 tablet 1  . ezetimibe (ZETIA) 10 MG tablet Take 1 tablet (10 mg total) by mouth daily. 90 tablet 1  . furosemide (LASIX) 40 MG tablet TAKE 1 TABLET BY MOUTH EVERY DAY . TAKE ADDITIONAL 40MG  AS NEEDED FOR SWELLING AND OR WEIGHT 180 tablet 1  . hydrALAZINE (APRESOLINE) 25 MG tablet Take 1 tablet (25 mg total) by mouth 2 (two) times daily. 180 tablet 1  . Multiple Vitamin (MULTIVITAMIN WITH MINERALS) TABS Take 1 tablet by mouth daily.    Marland Kitchen olmesartan (BENICAR) 40 MG tablet Take 1 tablet (40 mg total) by mouth daily. 90 tablet 1   No facility-administered medications prior to visit.      Allergies:   Sulfamethoxazole-trimethoprim   Past Medical History:  Diagnosis Date  . Aortic stenosis 2008  . Arthritis   . Blood transfusion   . Blood transfusion without reported diagnosis   . CAD (coronary artery disease) 2008  . Complication of anesthesia    hallucinated once  . H/O hiatal hernia   . 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 Hx of colonic polyps   . Hyperlipidemia   . Hypertension     Past Surgical History:  Procedure Laterality Date  . AORTIC VALVE REPLACEMENT  2009   Bioprosthetic at [place]  2008  . CHOLECYSTECTOMY  1982  . COLONOSCOPY    . CORONARY ARTERY BYPASS GRAFT  2009   SVG-PDA w/ AVR  . CORRECTION HAMMER TOE  2011   BILATERAL  . 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 POLYPECTOMY    . TONSILLECTOMY      . TOTAL HIP ARTHROPLASTY Left 2000  . TOTAL KNEE ARTHROPLASTY  2000   left  . TOTAL KNEE ARTHROPLASTY  12/01/2011   Procedure: TOTAL KNEE ARTHROPLASTY;  Surgeon: Mauri Pole, MD;  Location: [facility];  Service: Orthopedics;  Laterality: Right;  . WEIL OSTEOTOMY  05/28/2011     Social History:  The patient  reports that he quit smoking about 44 years ago. He has never used smokeless tobacco. He reports that he does not drink alcohol or use drugs.   Family History:  The patient's family history includes Colon cancer in his mother; Heart attack in his brother and father; Hyperlipidemia in his other; Hypertension in his brother, other, and sister.    ROS:  Please see the history of present illness. All other systems are reviewed and  Negative to the above problem except as noted.    PHYSICAL EXAM: VS:  BP (!) 130/54   Pulse 67   Ht 5\' 11"  (1.803 m)   Wt 101.2 kg (223 lb)   SpO2 96%   BMI 31.10 kg/m   GEN: Well nourished, well developed, in no acute distress  HEENT: normal  Neck: no JVD, carotid bruits, or masses Cardiac: RRR with occas skips  Gr II/VI systolic murmur LSB  to base;  No  rubs, or gallops,  No signif edema   Respiratory:  clear to auscultation bilaterally, normal work of breathing GI: soft, nontender, nondistended, + BS  No hepatomegaly  MS: no deformity Moving all extremities   Skin: warm and dry, no rash Neuro:  Strength and sensation are intact Psych: euthymic mood, full affect   EKG:  EKG is not  ordered today.    Lipid Panel    Component Value Date/Time   CHOL 130 10/08/2016 1119   TRIG 77 10/08/2016 1119   HDL 51 10/08/2016 1119   CHOLHDL 2.5 10/08/2016 1119   CHOLHDL 3 11/14/2015 0954   VLDL 16.8 11/14/2015 0954   LDLCALC 64 10/08/2016 1119      Wt Readings from Last 3 Encounters:  08/10/17 101.2 kg (223 lb)  05/21/17 104.8 kg (231 lb)  03/16/17 102.7 kg (226 lb 6.4 oz)      ASSESSMENT AND PLAN:  1  CAD  No symptoms of angina  2  PVCs   Asymptomatic  COntinue coreg   2.  HTN  BP is elevated here  164/ on my check  He says he is nervuous  Will have him keep log  Bring in BP cuff in 4 to 6 wks    3  HL  Check lipids today on meds  4  AV  dz  S/p replacement   Mean gradient unchanged  22 mm Hg  F/U with periodic echoes    Current medicines are reviewed at length with the patient today.  The patient does not have concerns regarding medicines.  Signed, [name], MD  08/10/2017 12:43 PM    [facility] [address], [place], [place]  [address] Phone: [phone]; Fax: [phone]

## 2017-08-10 NOTE — Patient Instructions (Signed)
Your physician recommends that you continue on your current medications as directed. Please refer to the Current Medication list given to you today. Your physician recommends that you return for lab work today (CBC, CMET, TSH, FREE T3, FREE T4, BNP, LIPIDS, PSA) Your physician wants you to follow-up in: [place].  You will receive a reminder letter in the mail two months in advance. If you don't receive a letter, please call our office to schedule the follow-up appointment.

## 2017-08-11 ENCOUNTER — Encounter (INDEPENDENT_AMBULATORY_CARE_PROVIDER_SITE_OTHER): Payer: Self-pay

## 2017-08-11 ENCOUNTER — Other Ambulatory Visit: Payer: Self-pay | Admitting: *Deleted

## 2017-08-11 DIAGNOSIS — Z79899 Other long term (current) drug therapy: Secondary | ICD-10-CM

## 2017-08-11 LAB — COMPREHENSIVE METABOLIC PANEL
A/G RATIO: 1.5 (ref 1.2–2.2)
ALK PHOS: 93 IU/L (ref 39–117)
ALT: 27 IU/L (ref 0–44)
AST: 25 IU/L (ref 0–40)
Albumin: 4.3 g/dL (ref 3.5–4.8)
BILIRUBIN TOTAL: 0.7 mg/dL (ref 0.0–1.2)
BUN / CREAT RATIO: 18 (ref 10–24)
BUN: 21 mg/dL (ref 8–27)
CHLORIDE: 105 mmol/L (ref 96–106)
CO2: 27 mmol/L (ref 20–29)
Calcium: 9.4 mg/dL (ref 8.6–10.2)
Creatinine, Ser: 1.15 mg/dL (ref 0.76–1.27)
GFR calc non Af Amer: 61 mL/min/{1.73_m2} (ref >59)
GFR, EST AFRICAN AMERICAN: 71 mL/min/{1.73_m2} (ref >59)
Globulin, Total: 2.8 g/dL (ref 1.5–4.5)
Glucose: 74 mg/dL (ref 65–99)
POTASSIUM: 4.8 mmol/L (ref 3.5–5.2)
SODIUM: 146 mmol/L — AB (ref 134–144)
TOTAL PROTEIN: 7.1 g/dL (ref 6.0–8.5)

## 2017-08-11 LAB — TSH: TSH: 12.28 u[IU]/mL — ABNORMAL HIGH (ref 0.450–4.500)

## 2017-08-11 LAB — CBC
Hematocrit: 38.4 % (ref 37.5–51.0)
Hemoglobin: 12.8 g/dL — ABNORMAL LOW (ref 13.0–17.7)
MCH: 31.2 pg (ref 26.6–33.0)
MCHC: 33.3 g/dL (ref 31.5–35.7)
MCV: 94 fL (ref 79–97)
PLATELETS: 199 10*3/uL (ref 150–450)
RBC: 4.1 x10E6/uL — ABNORMAL LOW (ref 4.14–5.80)
RDW: 14.1 % (ref 12.3–15.4)
WBC: 7.8 10*3/uL (ref 3.4–10.8)

## 2017-08-11 LAB — T3, FREE: T3, Free: 2.9 pg/mL (ref 2.0–4.4)

## 2017-08-11 LAB — LIPID PANEL
CHOL/HDL RATIO: 2.5 ratio (ref 0.0–5.0)
Cholesterol, Total: 144 mg/dL (ref 100–199)
HDL: 57 mg/dL (ref >39)
LDL Calculated: 70 mg/dL (ref 0–99)
Triglycerides: 84 mg/dL (ref 0–149)
VLDL Cholesterol Cal: 17 mg/dL (ref 5–40)

## 2017-08-11 LAB — T4, FREE: FREE T4: 1 ng/dL (ref 0.82–1.77)

## 2017-08-11 LAB — PSA: PROSTATE SPECIFIC AG, SERUM: 2 ng/mL (ref 0.0–4.0)

## 2017-08-11 LAB — PRO B NATRIURETIC PEPTIDE: NT-Pro BNP: 330 pg/mL (ref 0–486)

## 2017-08-11 NOTE — Progress Notes (Signed)
Notes recorded by Fay Records, MD on 08/11/2017 at 10:54 AM EDT CBC is OK Kidney functoin and liver function are normal LIpids are very good PSA normal Thyroid function is OK but needs to be followed with amiodarone use Would repeat TSH, free T3, free T4 in 3 months   Orders placed.  Will notify patient via MyChart of his appointment.

## 2017-08-17 DIAGNOSIS — D2371 Other benign neoplasm of skin of right lower limb, including hip: Secondary | ICD-10-CM | POA: Diagnosis not present

## 2017-08-17 DIAGNOSIS — D2372 Other benign neoplasm of skin of left lower limb, including hip: Secondary | ICD-10-CM | POA: Diagnosis not present

## 2017-09-02 ENCOUNTER — Telehealth: Payer: Self-pay | Admitting: Internal Medicine

## 2017-09-02 NOTE — Telephone Encounter (Signed)
New message    Patient calling to report a cut on his leg from car door. Cut has stopped bleeding, now is leaking clear fluid. Patient has questions about swelling and taking Lasix. Has not contacted PCP.  Requesting to speak with nurse.    1) How much weight have you gained and in what time span? n/a  2) If swelling, where is the swelling located? LEGS  Are you currently taking a fluid pill? YES 3) Are you currently SOB? NO  4) Do you have a log of your daily weights (if so, list)? n/a  5) Have you gained 3 pounds in a day or 5 pounds in a week? NO per patient  6) Have you traveled recently? NO

## 2017-09-02 NOTE — Telephone Encounter (Signed)
Patient hit let on bottom edge of car door about a week ago.  Small open area that is seeping serious fluid.  No signs of infection.  Seeps during the day when he is up.   Advised to keep this area covered during the day when he is out and to take precautions to prevent infection.  Advised the area may be trying to scab over but the fluid he retains is seeping out. Advised to cover to protect it from further trauma and infection.  Advised on s/s of infection and to see medical treatment if redness, change in color of drainage or pain change.

## 2017-09-13 DIAGNOSIS — M216X1 Other acquired deformities of right foot: Secondary | ICD-10-CM | POA: Diagnosis not present

## 2017-09-13 DIAGNOSIS — M7751 Other enthesopathy of right foot: Secondary | ICD-10-CM | POA: Diagnosis not present

## 2017-09-27 ENCOUNTER — Encounter: Payer: Self-pay | Admitting: Podiatry

## 2017-09-27 ENCOUNTER — Ambulatory Visit (AMBULATORY_SURGERY_CENTER): Payer: Self-pay | Admitting: *Deleted

## 2017-09-27 ENCOUNTER — Ambulatory Visit (INDEPENDENT_AMBULATORY_CARE_PROVIDER_SITE_OTHER): Payer: Medicare Other | Admitting: Podiatry

## 2017-09-27 ENCOUNTER — Ambulatory Visit: Payer: Medicare Other

## 2017-09-27 VITALS — BP 187/69 | HR 61 | Resp 16

## 2017-09-27 VITALS — Ht 71.0 in | Wt 227.0 lb

## 2017-09-27 DIAGNOSIS — I2510 Atherosclerotic heart disease of native coronary artery without angina pectoris: Secondary | ICD-10-CM | POA: Insufficient documentation

## 2017-09-27 DIAGNOSIS — Z86010 Personal history of colonic polyps: Secondary | ICD-10-CM

## 2017-09-27 DIAGNOSIS — M216X1 Other acquired deformities of right foot: Secondary | ICD-10-CM

## 2017-09-27 DIAGNOSIS — I350 Nonrheumatic aortic (valve) stenosis: Secondary | ICD-10-CM | POA: Insufficient documentation

## 2017-09-27 DIAGNOSIS — I4510 Unspecified right bundle-branch block: Secondary | ICD-10-CM | POA: Insufficient documentation

## 2017-09-27 DIAGNOSIS — Z800 Family history of malignant neoplasm of digestive organs: Secondary | ICD-10-CM

## 2017-09-27 MED ORDER — PEG 3350-KCL-NA BICARB-NACL 420 G PO SOLR: 4000.0000 mL | Freq: Once | ORAL | 0 refills | Status: AC

## 2017-09-27 NOTE — Progress Notes (Signed)
Patient denies any allergies to eggs or soy. Patient denies any problems with anesthesia/sedation. Patient denies any oxygen use at home. Patient denies taking any diet/weight loss medications or blood thinners. EMMI education declined. 2 day prep instructions given to pt.

## 2017-09-27 NOTE — Patient Instructions (Signed)
Pre-Operative Instructions  Congratulations, you have decided to take an important step towards improving your quality of life.  You can be assured that the doctors and staff at [facility] will be with you every step of the way.  Here are some important things you should know:  1. Plan to be at the surgery center/hospital at least 1 (one) hour prior to your scheduled time, unless otherwise directed by the surgical center/hospital staff.  You must have a responsible adult accompany you, remain during the surgery and drive you home.  Make sure you have directions to the surgical center/hospital to ensure you arrive on time. 2. If you are having surgery at [facility] or [facility] hospitals, you will need a copy of your medical history and physical form from your family physician within one month prior to the date of surgery. We will give you a form for your primary physician to complete.  3. We make every effort to accommodate the date you request for surgery.  However, there are times where surgery dates or times have to be moved.  We will contact you as soon as possible if a change in schedule is required.   4. No aspirin/ibuprofen for one week before surgery.  If you are on aspirin, any non-steroidal anti-inflammatory medications (Mobic, Aleve, Ibuprofen) should not be taken seven (7) days prior to your surgery.  You make take Tylenol for pain prior to surgery.  5. Medications - If you are taking daily heart and blood pressure medications, seizure, reflux, allergy, asthma, anxiety, pain or diabetes medications, make sure you notify the surgery center/hospital before the day of surgery so they can tell you which medications you should take or avoid the day of surgery. 6. No food or drink after midnight the night before surgery unless directed otherwise by surgical center/hospital staff. 7. No alcoholic beverages 24-hours prior to surgery.  No smoking 24-hours prior or 24-hours after  surgery. 8. Wear loose pants or shorts. They should be loose enough to fit over bandages, boots, and casts. 9. Don't wear slip-on shoes. Sneakers are preferred. 10. Bring your boot with you to the surgery center/hospital.  Also bring crutches or a walker if your physician has prescribed it for you.  If you do not have this equipment, it will be provided for you after surgery. 11. If you have not been contacted by the surgery center/hospital by the day before your surgery, call to confirm the date and time of your surgery. 12. Leave-time from work may vary depending on the type of surgery you have.  Appropriate arrangements should be made prior to surgery with your employer. 13. Prescriptions will be provided immediately following surgery by your doctor.  Fill these as soon as possible after surgery and take the medication as directed. Pain medications will not be refilled on weekends and must be approved by the doctor. 14. Remove nail polish on the operative foot and avoid getting pedicures prior to surgery. 15. Wash the night before surgery.  The night before surgery wash the foot and leg well with water and the antibacterial soap provided. Be sure to pay special attention to beneath the toenails and in between the toes.  Wash for at least three (3) minutes. Rinse thoroughly with water and dry well with a towel.  Perform this wash unless told not to do so by your physician.  Enclosed: 1 Ice pack (please put in freezer the night before surgery)   1 Hibiclens skin cleaner     Pre-op instructions  If you have any questions regarding the instructions, please do not hesitate to call our office.  [place]: [address], , [place] [address] -- [phone]  [place]: [address] [place], [place] [address] -- [phone]  [place]: [address]  [place], [place] [address] -- [phone]  [place]: [address], [address], [place], [place] [address] -- [phone]  Website:  [email or web address] 

## 2017-09-28 ENCOUNTER — Telehealth: Payer: Self-pay | Admitting: Podiatry

## 2017-09-28 ENCOUNTER — Other Ambulatory Visit: Payer: Self-pay

## 2017-09-28 MED ORDER — TRAMADOL HCL 50 MG PO TABS: 50.0000 mg | ORAL_TABLET | Freq: Three times a day (TID) | ORAL | 0 refills | Status: DC | PRN

## 2017-09-28 NOTE — Telephone Encounter (Signed)
Patient said Dr. Amalia Hailey was suppose to call a RX in to [facility] in [place] and Im not showing anything in system.

## 2017-09-29 ENCOUNTER — Encounter: Payer: Self-pay | Admitting: Family Medicine

## 2017-09-29 ENCOUNTER — Ambulatory Visit (INDEPENDENT_AMBULATORY_CARE_PROVIDER_SITE_OTHER): Payer: Medicare Other | Admitting: Family Medicine

## 2017-09-29 ENCOUNTER — Ambulatory Visit (INDEPENDENT_AMBULATORY_CARE_PROVIDER_SITE_OTHER): Payer: Medicare Other

## 2017-09-29 VITALS — BP 130/82 | HR 75 | Temp 98.3°F | Resp 16 | Ht 71.0 in | Wt 226.4 lb

## 2017-09-29 DIAGNOSIS — J988 Other specified respiratory disorders: Secondary | ICD-10-CM | POA: Diagnosis not present

## 2017-09-29 DIAGNOSIS — R0989 Other specified symptoms and signs involving the circulatory and respiratory systems: Secondary | ICD-10-CM

## 2017-09-29 DIAGNOSIS — J989 Respiratory disorder, unspecified: Secondary | ICD-10-CM

## 2017-09-29 DIAGNOSIS — R05 Cough: Secondary | ICD-10-CM

## 2017-09-29 DIAGNOSIS — I25119 Atherosclerotic heart disease of native coronary artery with unspecified angina pectoris: Secondary | ICD-10-CM | POA: Diagnosis not present

## 2017-09-29 DIAGNOSIS — J45909 Unspecified asthma, uncomplicated: Secondary | ICD-10-CM | POA: Diagnosis not present

## 2017-09-29 DIAGNOSIS — R059 Cough, unspecified: Secondary | ICD-10-CM

## 2017-09-29 MED ORDER — PREDNISONE 20 MG PO TABS: 20.0000 mg | ORAL_TABLET | Freq: Every day | ORAL | 0 refills | Status: AC

## 2017-09-29 MED ORDER — ALBUTEROL SULFATE HFA 108 (90 BASE) MCG/ACT IN AERS: 2.0000 | INHALATION_SPRAY | Freq: Four times a day (QID) | RESPIRATORY_TRACT | 0 refills | Status: DC | PRN

## 2017-09-29 MED ORDER — BENZONATATE 100 MG PO CAPS: 200.0000 mg | ORAL_CAPSULE | Freq: Two times a day (BID) | ORAL | 0 refills | Status: AC | PRN

## 2017-09-29 NOTE — Patient Instructions (Signed)
A few things to remember from today's visit:   Reactive airway disease that is not asthma - Plan: DG Chest 2 View, albuterol (PROVENTIL HFA;VENTOLIN HFA) 108 (90 Base) MCG/ACT inhaler  Viral upper respiratory tract infection  Cough - Plan: benzonatate (TESSALON) 100 MG capsule, predniSONE (DELTASONE) 20 MG tablet  Albuterol inh 2 puff every 6 hours for a week then as needed for wheezing or shortness of breath.   Take prednisone 1 to 2 tablets with breakfast for 3 to 5 days. Mucinex plain 400 mg 3 times daily may help with cough.  Monitor for fever, chills, shortness of breath, or worsening cough.  Please be sure medication list is accurate. If a new problem present, please set up appointment sooner than planned today.

## 2017-09-29 NOTE — Progress Notes (Signed)
ACUTE VISIT  HPI:  Chief Complaint  Patient presents with  . Cough    started almost 2 weeks ago  . Nasal Congestion    [name] is a 77 y.o.male here today with his complaining of 2 weeks of respiratory symptoms. Productive cough,he cannot bring sputum up.  Wheezing at bedtime. Denies dyspnea, chest pain,or diaphoresis.  No Hx of asthma or COPD.   Cough  This is a new problem. The current episode started 1 to 4 weeks ago. The problem has been unchanged. The cough is productive of sputum. Associated symptoms include postnasal drip and wheezing. Pertinent negatives include no chest pain, chills, ear congestion, ear pain, fever, headaches, heartburn, hemoptysis, myalgias, nasal congestion, rash, rhinorrhea, sore throat, shortness of breath, sweats or weight loss. The symptoms are aggravated by lying down. He has tried OTC cough suppressant for the symptoms. The treatment provided mild relief. There is no history of asthma, COPD or environmental allergies.   Cough is interfering with sleep.  No Hx of recent travel. No sick contact.  Hx of allergies: Negative.  OTC medications for this problem: Coricidin   Symptoms otherwise stable.   Review of Systems  Constitutional: Negative for chills, fever and weight loss.  HENT: Positive for postnasal drip. Negative for congestion, ear pain, rhinorrhea, sore throat and trouble swallowing.   Respiratory: Positive for cough and wheezing. Negative for hemoptysis and shortness of breath.   Cardiovascular: Negative for chest pain, palpitations and leg swelling.  Gastrointestinal: Negative for abdominal pain, heartburn, nausea and vomiting.  Musculoskeletal: Negative for myalgias.  Skin: Negative for rash.  Allergic/Immunologic: Negative for environmental allergies.  Neurological: Negative for syncope, weakness and headaches.  Hematological: Negative for adenopathy.  Psychiatric/Behavioral: Positive for sleep disturbance.  Negative for confusion.      Current Outpatient Medications on File Prior to Visit  Medication Sig Dispense Refill  . amiodarone (PACERONE) 200 MG tablet Take 1 tablet (200 mg total) by mouth daily. 90 tablet 1  . amLODipine-olmesartan (AZOR) 5-40 MG tablet Take 1 tablet by mouth daily.    Marland Kitchen aspirin 81 MG tablet Take 81 mg by mouth daily.    Marland Kitchen atorvastatin (LIPITOR) 80 MG tablet TAKE 1 TABLET (80 MG TOTAL) BY MOUTH DAILY. 90 tablet 1  . Calcium Carbonate-Vitamin D (CALCIUM-VITAMIN D) 500-200 MG-UNIT tablet Take 1 tablet by mouth daily.    . carvedilol (COREG) 6.25 MG tablet Take 1 tablet (6.25 mg total) by mouth 2 (two) times daily. 180 tablet 1  . ezetimibe (ZETIA) 10 MG tablet Take 1 tablet (10 mg total) by mouth daily. 90 tablet 1  . furosemide (LASIX) 40 MG tablet TAKE 1 TABLET BY MOUTH EVERY DAY . TAKE ADDITIONAL 40MG  AS NEEDED FOR SWELLING AND OR WEIGHT 180 tablet 1  . hydrALAZINE (APRESOLINE) 25 MG tablet Take 1 tablet (25 mg total) by mouth 2 (two) times daily. 180 tablet 1  . Multiple Vitamin (MULTIVITAMIN WITH MINERALS) TABS Take 1 tablet by mouth daily.    . traMADol (ULTRAM) 50 MG tablet Take 1 tablet (50 mg total) by mouth every 8 (eight) hours as needed. 30 tablet 0   No current facility-administered medications on file prior to visit.      Past Medical History:  Diagnosis Date  . Aortic stenosis 2008  . Arthritis   . Blood transfusion   . Blood transfusion without reported diagnosis   . CAD (coronary artery disease) 2008  . Complication of anesthesia  hallucinated once  . H/O hiatal hernia   . 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 Hx of colonic polyps   . Hyperlipidemia   . Hypertension    Allergies  Allergen Reactions  . Sulfamethoxazole-Trimethoprim Other (See Comments)    Carlyn Reichert Syndrome    Social History   Socioeconomic History  . Marital status: Married    Spouse name: Not on file  . Number of children: Not on file  . Years  of education: Not on file  . Highest education level: Not on file  Occupational History  . Occupation: retired  Scientific laboratory technician  . Financial resource strain: Not on file  . Food insecurity:    Worry: Not on file    Inability: Not on file  . Transportation needs:    Medical: Not on file    Non-medical: Not on file  Tobacco Use  . Smoking status: Former Smoker    Last attempt to quit: 05/25/1973    Years since quitting: 44.3  . Smokeless tobacco: Never Used  Substance and Sexual Activity  . Alcohol use: Not Currently    Comment: rare  . Drug use: No  . Sexual activity: Not Currently  Lifestyle  . Physical activity:    Days per week: Not on file    Minutes per session: Not on file  . Stress: Not on file  Relationships  . Social connections:    Talks on phone: Not on file    Gets together: Not on file    Attends religious service: Not on file    Active member of club or organization: Not on file    Attends meetings of clubs or organizations: Not on file    Relationship status: Not on file  Other Topics Concern  . Not on file  Social History Narrative   Regular exercise-yes. Pt lives in [place] with his wife.    Vitals:   09/29/17 1442  BP: 130/82  Pulse: 75  Resp: 16  Temp: 98.3 F (36.8 C)  SpO2: 97%   Body mass index is 31.57 kg/m.    Physical Exam  Nursing note and vitals reviewed. Constitutional: He is oriented to person, place, and time. He appears well-developed. He does not appear ill. No distress.  HENT:  Head: Normocephalic and atraumatic.  Nose: Right sinus exhibits no maxillary sinus tenderness and no frontal sinus tenderness. Left sinus exhibits no maxillary sinus tenderness and no frontal sinus tenderness.  Mouth/Throat: Oropharynx is clear and moist and mucous membranes are normal.  Postnasal drainage.  Eyes: Conjunctivae are normal.  Cardiovascular: Normal rate and regular rhythm.  No murmur heard. Respiratory: Effort normal. No  respiratory distress. He has wheezes. He has no rales.  Lymphadenopathy:    He has no cervical adenopathy.  Neurological: He is alert and oriented to person, place, and time. He has normal strength. Gait normal.  Skin: Skin is warm. No rash noted. No erythema.  Psychiatric: He has a normal mood and affect.  Well groomed, good eye contact.     ASSESSMENT AND PLAN:   Mr. Hawkin was seen today for cough and nasal congestion.  Diagnoses and all orders for this visit:  Reactive airway disease that is not asthma  No Hx of asthma or COPD. Former smoker. Albuterol inh 2 puff every 6 hours for a week then as needed for wheezing or shortness of breath.  After discussion of some side effects he agrees with  taking Prednisone for 3-5 days.  -     DG Chest 2 View; Future -     albuterol (PROVENTIL HFA;VENTOLIN HFA) 108 (90 Base) MCG/ACT inhaler; Inhale 2 puffs into the lungs every 6 (six) hours as needed for wheezing or shortness of breath.  Respiratory tract infection  Most likely viral illness. I do not think abx is needed at this time. Further recommendations will be given according to imaging results.  Cough  Explained that cough can last a few more days or weeks. Plain Mucinex 400 mg tid. Adequate hydration and honey may also help. F/U as needed.   -     benzonatate (TESSALON) 100 MG capsule; Take 2 capsules (200 mg total) by mouth 2 (two) times daily as needed for up to 10 days. -     predniSONE (DELTASONE) 20 MG tablet; Take 1 tablet (20 mg total) by mouth daily with breakfast for 5 days.      Ermin Parisien G. Martinique, MD  [facility]. [place] office.

## 2017-09-30 NOTE — Progress Notes (Signed)
   HPI: 77 year old male presenting today as a new patient with a chief complaint of sharp, aching pain of the plantar aspect of the right foot that has been ongoing for the past several years. He reports a callused area that feels like he is walking on bone. Walking for long periods of time increases the pain. He has been using an [employer] on the area with no significant relief. Patient is here for further evaluation and treatment.   Past Medical History:  Diagnosis Date  . Aortic stenosis 2008  . Arthritis   . Blood transfusion   . Blood transfusion without reported diagnosis   . CAD (coronary artery disease) 2008  . Complication of anesthesia    hallucinated once  . H/O hiatal hernia   . 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 Hx of colonic polyps   . Hyperlipidemia   . Hypertension      Physical Exam: General: The patient is alert and oriented x3 in no acute distress.  Dermatology: Skin is warm, dry and supple bilateral lower extremities. Negative for open lesions or macerations.  Vascular: Palpable pedal pulses bilaterally. No edema or erythema noted. Capillary refill within normal limits.  Neurological: Epicritic and protective threshold grossly intact bilaterally.   Musculoskeletal Exam: Pain with palpation and with range of motion to the prominent 3rd and 4th metatarsal head with fat pad atrophy noted to the right forefoot.     Assessment: 1. 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 Plantar flexed 3rd and 4th metatarsal right    Plan of Care:  1. Patient evaluated. X-Rays reviewed.  2. Today we discussed the conservative versus surgical management of the presenting pathology. The patient opts for surgical management. All possible complications and details of the procedure were explained. All patient questions were answered. No guarantees were expressed or implied. 3. Authorization for surgery was initiated today. Surgery will consist of 3rd and 4th metatarsal  head resection of the right foot.  4. Return to clinic one week post op.       Edrick Kins, DPM [facility]  Dr. Edrick Kins, DPM    [address], [place] [address]                Office [phone]  Fax [phone]

## 2017-10-01 ENCOUNTER — Telehealth: Payer: Self-pay | Admitting: Family Medicine

## 2017-10-01 ENCOUNTER — Telehealth: Payer: Self-pay

## 2017-10-01 ENCOUNTER — Other Ambulatory Visit: Payer: Self-pay | Admitting: Family Medicine

## 2017-10-01 ENCOUNTER — Encounter: Payer: Self-pay | Admitting: Family Medicine

## 2017-10-01 MED ORDER — AEROCHAMBER PLUS MISC: 1 refills | Status: DC

## 2017-10-01 NOTE — Telephone Encounter (Signed)
Please result and advise  Thanks!

## 2017-10-01 NOTE — Telephone Encounter (Signed)
Prescription for a spacer was sent to his pharmacy requested. Thanks, BJ

## 2017-10-01 NOTE — Telephone Encounter (Signed)
Copied from [place] [id number]. Topic: Inquiry >> Oct 01, 2017 11:27 AM Oliver Pila B wrote: Reason for CRM: pt called to get results of latest images done; contact to advise

## 2017-10-01 NOTE — Telephone Encounter (Signed)
Noted.    Will inform pt.

## 2017-10-01 NOTE — Telephone Encounter (Signed)
Pt is requesting a spacer for his inhaler.

## 2017-10-01 NOTE — Telephone Encounter (Signed)
Report of CXR was reviewed and results sent through my chart.  Thanks, BJ

## 2017-10-04 ENCOUNTER — Encounter: Payer: Medicare Other | Admitting: Internal Medicine

## 2017-10-04 NOTE — Telephone Encounter (Signed)
Noted  

## 2017-10-08 ENCOUNTER — Encounter: Payer: Self-pay | Admitting: *Deleted

## 2017-10-12 ENCOUNTER — Ambulatory Visit: Payer: Medicare Other | Admitting: Family Medicine

## 2017-10-22 ENCOUNTER — Telehealth: Payer: Self-pay | Admitting: Internal Medicine

## 2017-10-22 NOTE — Telephone Encounter (Signed)
New message:      Pt is calling and is needing a sooner appt. I have scheduled the appt for 10/1 but the pt would like a sooner appt.

## 2017-10-22 NOTE — Telephone Encounter (Signed)
Call returned to number. Sending message via [place].

## 2017-10-28 ENCOUNTER — Encounter: Payer: Self-pay | Admitting: Podiatry

## 2017-10-28 DIAGNOSIS — Z4889 Encounter for other specified surgical aftercare: Secondary | ICD-10-CM

## 2017-10-28 DIAGNOSIS — E7800 Pure hypercholesterolemia, unspecified: Secondary | ICD-10-CM | POA: Diagnosis not present

## 2017-10-28 DIAGNOSIS — T84196D Other mechanical complication of internal fixation device of bone of right lower leg, subsequent encounter: Secondary | ICD-10-CM | POA: Diagnosis not present

## 2017-10-28 DIAGNOSIS — M216X1 Other acquired deformities of right foot: Secondary | ICD-10-CM

## 2017-10-28 DIAGNOSIS — M7741 Metatarsalgia, right foot: Secondary | ICD-10-CM | POA: Diagnosis not present

## 2017-10-29 ENCOUNTER — Other Ambulatory Visit: Payer: Self-pay | Admitting: Internal Medicine

## 2017-11-01 ENCOUNTER — Ambulatory Visit: Payer: Medicare Other | Admitting: Internal Medicine

## 2017-11-03 ENCOUNTER — Ambulatory Visit (INDEPENDENT_AMBULATORY_CARE_PROVIDER_SITE_OTHER): Payer: Self-pay | Admitting: Podiatry

## 2017-11-03 ENCOUNTER — Encounter: Payer: Self-pay | Admitting: Podiatry

## 2017-11-03 ENCOUNTER — Ambulatory Visit (INDEPENDENT_AMBULATORY_CARE_PROVIDER_SITE_OTHER): Payer: Medicare Other

## 2017-11-03 VITALS — BP 158/63 | HR 59 | Temp 97.9°F

## 2017-11-03 DIAGNOSIS — M216X1 Other acquired deformities of right foot: Secondary | ICD-10-CM

## 2017-11-03 DIAGNOSIS — Z9889 Other specified postprocedural states: Secondary | ICD-10-CM

## 2017-11-03 NOTE — Progress Notes (Signed)
   Subjective:  Patient presents today status post 3rd and 4th metatarsal head resections. DOS: 10/28/17. He reports some mild to moderate pain. He reports some associated swelling. He has been using the post op shoe as directed. He denies modifying factors. Patient is here for further evaluation and treatment.    Past Medical History:  Diagnosis Date  . Aortic stenosis 2008  . Arthritis   . Blood transfusion   . Blood transfusion without reported diagnosis   . CAD (coronary artery disease) 2008  . Complication of anesthesia    hallucinated once  . H/O hiatal hernia   . 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 Hx of colonic polyps   . Hyperlipidemia   . Hypertension       Objective/Physical Exam Neurovascular status intact.  Skin incisions appear to be well coapted with sutures and staples intact. No sign of infectious process noted. No dehiscence. No active bleeding noted. Moderate edema noted to the surgical extremity.  Radiographic Exam:  Orthopedic hardware and osteotomies sites appear to be stable with routine healing.  Assessment: 1. s/p 3rd and 4th metatarsal head resections right. DOS: 10/28/17   Plan of Care:  1. Patient was evaluated. X-rays reviewed 2. Dressing changed. Keep clean, dry and intact for one week.  3. Continue weightbearing in post op shoe.  4. Return to clinic in one week.    Edrick Kins, DPM [facility]  Dr. Edrick Kins, [address]                                        [place], [place] [address]                Office [phone]  Fax [phone]

## 2017-11-03 NOTE — Progress Notes (Signed)
DOS: 10-28-2017 Metatarsal head resections 3,4 Right foot

## 2017-11-07 ENCOUNTER — Other Ambulatory Visit: Payer: Self-pay | Admitting: Internal Medicine

## 2017-11-08 ENCOUNTER — Other Ambulatory Visit: Payer: Self-pay | Admitting: *Deleted

## 2017-11-08 MED ORDER — EZETIMIBE 10 MG PO TABS: 10.0000 mg | ORAL_TABLET | Freq: Every day | ORAL | 2 refills | Status: DC

## 2017-11-10 ENCOUNTER — Encounter: Payer: Medicare Other | Admitting: Podiatry

## 2017-11-10 ENCOUNTER — Ambulatory Visit (INDEPENDENT_AMBULATORY_CARE_PROVIDER_SITE_OTHER): Payer: Medicare Other | Admitting: Podiatry

## 2017-11-10 DIAGNOSIS — Z9889 Other specified postprocedural states: Secondary | ICD-10-CM

## 2017-11-10 DIAGNOSIS — M216X1 Other acquired deformities of right foot: Secondary | ICD-10-CM

## 2017-11-10 NOTE — Progress Notes (Signed)
   Subjective:  Patient presents today status post 3rd and 4th metatarsal head resections. DOS: 10/28/17. He is here for suture removal and is nervous about the procedure. He denies much pain or modifying factors. He has been using the post op shoe as directed without issue. Patient is here for further evaluation and treatment.    Past Medical History:  Diagnosis Date  . Aortic stenosis 2008  . Arthritis   . Blood transfusion   . Blood transfusion without reported diagnosis   . CAD (coronary artery disease) 2008  . Complication of anesthesia    hallucinated once  . H/O hiatal hernia   . 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 Hx of colonic polyps   . Hyperlipidemia   . Hypertension       Objective/Physical Exam Neurovascular status intact.  Skin incisions appear to be well coapted with sutures and staples intact. No sign of infectious process noted. No dehiscence. No active bleeding noted. Moderate edema noted to the surgical extremity.  Assessment: 1. s/p 3rd and 4th metatarsal head resections right. DOS: 10/28/17   Plan of Care:  1. Patient was evaluated.  2. Staples removed. Dry sterile dressing applied.  3. Transition into tennis shoes.  4. Return to clinic in 2 weeks.    Edrick Kins, DPM [facility]  Dr. Edrick Kins, [place]                                        [place], [place] [address]                Office [phone]  Fax [phone]

## 2017-11-11 ENCOUNTER — Other Ambulatory Visit: Payer: Medicare Other | Admitting: *Deleted

## 2017-11-11 DIAGNOSIS — Z79899 Other long term (current) drug therapy: Secondary | ICD-10-CM | POA: Diagnosis not present

## 2017-11-11 LAB — THYROID PANEL WITH TSH
Free Thyroxine Index: 1.4 (ref 1.2–4.9)
T3 Uptake Ratio: 23 % — ABNORMAL LOW (ref 24–39)
T4, Total: 6 ug/dL (ref 4.5–12.0)
TSH: 19.29 u[IU]/mL — ABNORMAL HIGH (ref 0.450–4.500)

## 2017-11-11 LAB — T4, FREE: Free T4: 0.89 ng/dL (ref 0.82–1.77)

## 2017-11-11 LAB — T3, FREE: T3, Free: 3.3 pg/mL (ref 2.0–4.4)

## 2017-11-15 ENCOUNTER — Encounter: Payer: Medicare Other | Admitting: Podiatry

## 2017-11-16 ENCOUNTER — Encounter: Payer: Self-pay | Admitting: Internal Medicine

## 2017-11-16 ENCOUNTER — Ambulatory Visit (INDEPENDENT_AMBULATORY_CARE_PROVIDER_SITE_OTHER): Payer: Medicare Other | Admitting: Internal Medicine

## 2017-11-16 VITALS — BP 126/64 | HR 65 | Ht 71.0 in | Wt 226.0 lb

## 2017-11-16 DIAGNOSIS — I493 Ventricular premature depolarization: Secondary | ICD-10-CM | POA: Diagnosis not present

## 2017-11-16 DIAGNOSIS — Z79899 Other long term (current) drug therapy: Secondary | ICD-10-CM

## 2017-11-16 DIAGNOSIS — I10 Essential (primary) hypertension: Secondary | ICD-10-CM | POA: Diagnosis not present

## 2017-11-16 DIAGNOSIS — I25119 Atherosclerotic heart disease of native coronary artery with unspecified angina pectoris: Secondary | ICD-10-CM | POA: Diagnosis not present

## 2017-11-16 MED ORDER — AMIODARONE HCL 200 MG PO TABS: ORAL_TABLET | ORAL | 3 refills | Status: DC

## 2017-11-16 MED ORDER — LEVOTHYROXINE SODIUM 25 MCG PO TABS: 25.0000 ug | ORAL_TABLET | Freq: Every day | ORAL | 3 refills | Status: DC

## 2017-11-16 NOTE — Patient Instructions (Addendum)
Medication Instructions:  Your physician has recommended you make the following change in your medication:   1.  Start taking Synthroid 25 mcg- Take one tablet by mouth in the morning before your breakfast  2.  Reduce your amiodarone 200 mg-  Take one tablet by mouth Monday through Friday.  DO NOT TAKE on Saturday or Sunday.  Labwork: Check a TSH in 3 months--Please schedule  Testing/Procedures: None ordered.  Follow-Up: Your physician wants you to follow-up in: 6 months with Dr. Lovena Le.   You will receive a reminder letter in the mail two months in advance. If you don't receive a letter, please call our office to schedule the follow-up appointment.  Any Other Special Instructions Will Be Listed Below (If Applicable).  If you need a refill on your cardiac medications before your next appointment, please call your pharmacy.

## 2017-11-16 NOTE — Progress Notes (Signed)
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 He was placed on amiodarone and his PVC's were reduced from 40% down to 9%. He feels well. He had blood work done and his TSH was increased even though his T4 was ok. He has recently had foot surgery. He denies chest pain or sob. He remains active. He has chronic peripheral edema.  Allergies  Allergen Reactions  . Sulfamethoxazole-Trimethoprim Other (See Comments)    Carlyn Reichert Syndrome     Current Outpatient Medications  Medication Sig Dispense Refill  . amLODipine-olmesartan (AZOR) 5-40 MG tablet Take 1 tablet by mouth daily.    Marland Kitchen aspirin 81 MG tablet Take 81 mg by mouth daily.    Marland Kitchen atorvastatin (LIPITOR) 80 MG tablet TAKE 1 TABLET (80 MG TOTAL) BY MOUTH DAILY. 90 tablet 1  . Calcium Carbonate-Vitamin D (CALCIUM-VITAMIN D) 500-200 MG-UNIT tablet Take 1 tablet by mouth daily.    . carvedilol (COREG) 6.25 MG tablet TAKE 1 TABLET (6.25 MG TOTAL) BY MOUTH 2 (TWO) TIMES DAILY. 180 tablet 2  . ezetimibe (ZETIA) 10 MG tablet Take 1 tablet (10 mg total) by mouth daily. 90 tablet 2  . furosemide (LASIX) 40 MG tablet TAKE 1 TABLET BY MOUTH EVERY DAY . TAKE ADDITIONAL 40MG  AS NEEDED FOR SWELLING AND OR WEIGHT 180 tablet 2  . hydrALAZINE (APRESOLINE) 25 MG tablet Take 1 tablet (25 mg total) by mouth 2 (two) times daily. 180 tablet 1  . Multiple Vitamin (MULTIVITAMIN WITH MINERALS) TABS Take 1 tablet by mouth daily.    Marland Kitchen olmesartan (BENICAR) 40 MG tablet Take 40 mg by mouth daily.  2  . Spacer/Aero-Holding Chambers (AEROCHAMBER PLUS) inhaler Use as instructed to use with inahaler. 1 each 1  . traMADol (ULTRAM) 50 MG tablet Take 1 tablet (50 mg total) by mouth every 8 (eight) hours as needed. 30 tablet 0  . albuterol (PROVENTIL HFA;VENTOLIN HFA) 108 (90 Base) MCG/ACT inhaler Inhale 2 puffs into the lungs every 6 (six) hours as needed for wheezing or shortness of breath. (Patient not taking: Reported on 11/16/2017) 1 Inhaler 0  .  amiodarone (PACERONE) 200 MG tablet Take one tablet by mouth daily Monday through Friday.  DO NOT take on Saturday or Sunday. 90 tablet 3  . levothyroxine (SYNTHROID) 25 MCG tablet Take 1 tablet (25 mcg total) by mouth daily before breakfast. 90 tablet 3   No current facility-administered medications for this visit.      Past Medical History:  Diagnosis Date  . Aortic stenosis 2008  . Arthritis   . Blood transfusion   . Blood transfusion without reported diagnosis   . CAD (coronary artery disease) 2008  . Complication of anesthesia    hallucinated once  . H/O hiatal hernia   . 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 Hx of colonic polyps   . Hyperlipidemia   . Hypertension     ROS:   All systems reviewed and negative except as noted in the HPI.   Past Surgical History:  Procedure Laterality Date  . AORTIC VALVE REPLACEMENT  2009   Bioprosthetic at [facility]  2008  . CHOLECYSTECTOMY  1982  . COLONOSCOPY    . CORONARY ARTERY BYPASS GRAFT  2009   SVG-PDA w/ AVR  . CORRECTION HAMMER TOE  2011   BILATERAL  . 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 POLYPECTOMY    .  TONSILLECTOMY    . TOTAL HIP ARTHROPLASTY Left 2000  . TOTAL KNEE ARTHROPLASTY  2000   left  . TOTAL KNEE ARTHROPLASTY  12/01/2011   Procedure: TOTAL KNEE ARTHROPLASTY;  Surgeon: Mauri Pole, MD;  Location: [facility];  Service: Orthopedics;  Laterality: Right;  . WEIL OSTEOTOMY  05/28/2011     Family History  Problem Relation Age of Onset  . Colon cancer Mother 47  . Heart attack Father   . Hypertension Other   . Hyperlipidemia Other   . Heart attack Brother   . Hypertension Sister   . Hypertension Brother   . Stroke Neg Hx      Social History   Socioeconomic History  . Marital status: Married    Spouse name: Not on file  . Number of children: Not on file  . Years of education: Not on file  . Highest education level: Not on  file  Occupational History  . Occupation: retired  Scientific laboratory technician  . Financial resource strain: Not on file  . Food insecurity:    Worry: Not on file    Inability: Not on file  . Transportation needs:    Medical: Not on file    Non-medical: Not on file  Tobacco Use  . Smoking status: Former Smoker    Last attempt to quit: 05/25/1973    Years since quitting: 44.5  . Smokeless tobacco: Never Used  Substance and Sexual Activity  . Alcohol use: Not Currently    Comment: rare  . Drug use: No  . Sexual activity: Not Currently  Lifestyle  . Physical activity:    Days per week: Not on file    Minutes per session: Not on file  . Stress: Not on file  Relationships  . Social connections:    Talks on phone: Not on file    Gets together: Not on file    Attends religious service: Not on file    Active member of club or organization: Not on file    Attends meetings of clubs or organizations: Not on file    Relationship status: Not on file  . Intimate partner violence:    Fear of current or ex partner: Not on file    Emotionally abused: Not on file    Physically abused: Not on file    Forced sexual activity: Not on file  Other Topics Concern  . Not on file  Social History Narrative   Regular exercise-yes. Pt lives in [place] with his wife.     BP 126/64   Pulse 65   Ht 5\' 11"  (1.803 m)   Wt 226 lb (102.5 kg)   BMI 31.52 kg/m   Physical Exam:  Well appearing NAD HEENT: Unremarkable Neck:  6 cm JVD, no thyromegally Lymphatics:  No adenopathy Back:  No CVA tenderness Lungs:  Clear with no wheezes HEART:  Regular rate rhythm, no murmurs, no rubs, no clicks Abd:  soft, positive bowel sounds, no organomegally, no rebound, no guarding Ext:  2 plus pulses, no edema, no cyanosis, no clubbing Skin:  No rashes no nodules Neuro:  CN II through XII intact, motor grossly intact  EKG - atrial fib with RBBB   Assess/Plan: 1. PVC's - he is improved. He will continue  his amio at a reduced dose of 1 gram a week. 2. Elevated TSH - we will start low dose synthroid. We will recheck his TSH in 3 months. 3. HTN - his blood pressure today  is well controlled. 4. Venous insufficiency - he has 1-2+ peripheral edema. I discussed the importance of a low sodium diet and keeping his legs elevated.  Mikle Bosworth.D.

## 2017-11-29 ENCOUNTER — Ambulatory Visit (INDEPENDENT_AMBULATORY_CARE_PROVIDER_SITE_OTHER): Payer: Medicare Other

## 2017-11-29 ENCOUNTER — Ambulatory Visit (INDEPENDENT_AMBULATORY_CARE_PROVIDER_SITE_OTHER): Payer: Medicare Other | Admitting: Podiatry

## 2017-11-29 DIAGNOSIS — M216X1 Other acquired deformities of right foot: Secondary | ICD-10-CM

## 2017-11-29 DIAGNOSIS — Z9889 Other specified postprocedural states: Secondary | ICD-10-CM

## 2017-11-30 DIAGNOSIS — Z23 Encounter for immunization: Secondary | ICD-10-CM | POA: Diagnosis not present

## 2017-11-30 NOTE — Progress Notes (Signed)
   Subjective:  Patient presents today status post 3rd and 4th metatarsal head resections right. DOS: 10/28/17. He states he is doing well overall. He states resting has helped the foot heal. He denies any modifying factors or significant pain. Patient is here for further evaluation and treatment.    Past Medical History:  Diagnosis Date  . Aortic stenosis 2008  . Arthritis   . Blood transfusion   . Blood transfusion without reported diagnosis   . CAD (coronary artery disease) 2008  . Complication of anesthesia    hallucinated once  . H/O hiatal hernia   . 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 Hx of colonic polyps   . Hyperlipidemia   . Hypertension       Objective/Physical Exam Neurovascular status intact.  Skin incisions appear to be well coapted. No sign of infectious process noted. No dehiscence. No active bleeding noted. Moderate edema noted to the surgical extremity.  Radiographic Exam:  Osteotomies sites appear to be stable with routine healing.  Assessment: 1. s/p 3rd and 4th metatarsal head resections right. DOS: 10/28/17   Plan of Care:  1. Patient was evaluated. X-Rays reviewed.  2. May resume full activity with no restrictions.  3. Recommended good shoe gear.  4. Return to clinic as needed.     Edrick Kins, DPM [facility]  Dr. Edrick Kins, [address]                                        [place], [place] [address]                Office [phone]  Fax [phone]

## 2017-12-03 ENCOUNTER — Other Ambulatory Visit: Payer: Self-pay | Admitting: Internal Medicine

## 2017-12-03 MED ORDER — AMIODARONE HCL 200 MG PO TABS: ORAL_TABLET | ORAL | 3 refills | Status: DC

## 2017-12-03 NOTE — Addendum Note (Signed)
Addended by: Derl Barrow on: 12/03/2017 10:06 AM   Modules accepted: Orders

## 2018-01-06 ENCOUNTER — Telehealth: Payer: Self-pay | Admitting: Internal Medicine

## 2018-01-06 NOTE — Telephone Encounter (Signed)
Dr. Hilarie Fredrickson see below, will you approve this switch?

## 2018-01-06 NOTE — Telephone Encounter (Signed)
Patient calling to schedule recall colon. Recall in system with Dr.Pyrtle but pt requesting transfer to Dr.Armbruster due to his wife Ramey Schiff) knowing him personally. Is Dr.Pyrtle okay with this switch?

## 2018-01-08 NOTE — Telephone Encounter (Signed)
[place] with me JMP

## 2018-01-10 DIAGNOSIS — M71571 Other bursitis, not elsewhere classified, right ankle and foot: Secondary | ICD-10-CM | POA: Diagnosis not present

## 2018-01-10 DIAGNOSIS — M722 Plantar fascial fibromatosis: Secondary | ICD-10-CM | POA: Diagnosis not present

## 2018-01-10 NOTE — Telephone Encounter (Signed)
See notes below, ok to schedule pt with Armbruster.

## 2018-01-10 NOTE — Telephone Encounter (Signed)
Okay with me if okay with Dr. Hilarie Fredrickson

## 2018-01-10 NOTE — Telephone Encounter (Signed)
Dr. Havery Moros please see notes below and advise if you will accept this pt.

## 2018-01-11 ENCOUNTER — Encounter: Payer: Self-pay | Admitting: Gastroenterology

## 2018-01-17 ENCOUNTER — Telehealth: Payer: Self-pay | Admitting: *Deleted

## 2018-01-17 NOTE — Telephone Encounter (Addendum)
"  I have an appointment with Dr. Amalia Hailey on December 16.  I know I'm going to need surgery.  I was wondering if there's any openings before the ending of this year.  If you would, please call me back.  Thank you very much, have a great day."

## 2018-01-19 NOTE — Telephone Encounter (Signed)
I left him a message that Dr. Amalia Hailey does not have any surgical time available this month.  I asked him to call if he has any other questions.

## 2018-01-26 ENCOUNTER — Ambulatory Visit: Payer: Medicare Other | Admitting: Podiatry

## 2018-01-31 ENCOUNTER — Ambulatory Visit (INDEPENDENT_AMBULATORY_CARE_PROVIDER_SITE_OTHER): Payer: Medicare Other | Admitting: Podiatry

## 2018-01-31 ENCOUNTER — Ambulatory Visit (INDEPENDENT_AMBULATORY_CARE_PROVIDER_SITE_OTHER): Payer: Medicare Other

## 2018-01-31 ENCOUNTER — Telehealth: Payer: Self-pay | Admitting: Podiatry

## 2018-01-31 ENCOUNTER — Encounter: Payer: Self-pay | Admitting: Podiatry

## 2018-01-31 ENCOUNTER — Other Ambulatory Visit: Payer: Self-pay | Admitting: Podiatry

## 2018-01-31 DIAGNOSIS — M7751 Other enthesopathy of right foot: Secondary | ICD-10-CM

## 2018-01-31 DIAGNOSIS — M79671 Pain in right foot: Secondary | ICD-10-CM

## 2018-01-31 MED ORDER — TRAMADOL HCL 50 MG PO TABS: 50.0000 mg | ORAL_TABLET | Freq: Three times a day (TID) | ORAL | 0 refills | Status: DC | PRN

## 2018-01-31 NOTE — Patient Instructions (Signed)
Pre-Operative Instructions  Congratulations, you have decided to take an important step towards improving your quality of life.  You can be assured that the doctors and staff at [facility] will be with you every step of the way.  Here are some important things you should know:  1. Plan to be at the surgery center/hospital at least 1 (one) hour prior to your scheduled time, unless otherwise directed by the surgical center/hospital staff.  You must have a responsible adult accompany you, remain during the surgery and drive you home.  Make sure you have directions to the surgical center/hospital to ensure you arrive on time. 2. If you are having surgery at [facility] or [facility] hospitals, you will need a copy of your medical history and physical form from your family physician within one month prior to the date of surgery. We will give you a form for your primary physician to complete.  3. We make every effort to accommodate the date you request for surgery.  However, there are times where surgery dates or times have to be moved.  We will contact you as soon as possible if a change in schedule is required.   4. No aspirin/ibuprofen for one week before surgery.  If you are on aspirin, any non-steroidal anti-inflammatory medications (Mobic, Aleve, Ibuprofen) should not be taken seven (7) days prior to your surgery.  You make take Tylenol for pain prior to surgery.  5. Medications - If you are taking daily heart and blood pressure medications, seizure, reflux, allergy, asthma, anxiety, pain or diabetes medications, make sure you notify the surgery center/hospital before the day of surgery so they can tell you which medications you should take or avoid the day of surgery. 6. No food or drink after midnight the night before surgery unless directed otherwise by surgical center/hospital staff. 7. No alcoholic beverages 24-hours prior to surgery.  No smoking 24-hours prior or 24-hours after  surgery. 8. Wear loose pants or shorts. They should be loose enough to fit over bandages, boots, and casts. 9. Don't wear slip-on shoes. Sneakers are preferred. 10. Bring your boot with you to the surgery center/hospital.  Also bring crutches or a walker if your physician has prescribed it for you.  If you do not have this equipment, it will be provided for you after surgery. 11. If you have not been contacted by the surgery center/hospital by the day before your surgery, call to confirm the date and time of your surgery. 12. Leave-time from work may vary depending on the type of surgery you have.  Appropriate arrangements should be made prior to surgery with your employer. 13. Prescriptions will be provided immediately following surgery by your doctor.  Fill these as soon as possible after surgery and take the medication as directed. Pain medications will not be refilled on weekends and must be approved by the doctor. 14. Remove nail polish on the operative foot and avoid getting pedicures prior to surgery. 15. Wash the night before surgery.  The night before surgery wash the foot and leg well with water and the antibacterial soap provided. Be sure to pay special attention to beneath the toenails and in between the toes.  Wash for at least three (3) minutes. Rinse thoroughly with water and dry well with a towel.  Perform this wash unless told not to do so by your physician.  Enclosed: 1 Ice pack (please put in freezer the night before surgery)   1 Hibiclens skin cleaner     Pre-op instructions  If you have any questions regarding the instructions, please do not hesitate to call our office.  [place]: [address], [place], [place] [address] -- [phone]  [place]: [address] [place], [place] [address] -- [phone]  [place]: [address]  [place],  [address] -- [phone]  [place]: [address], [address], [place],  [address] -- [phone]  Website:  [email or web address] 

## 2018-01-31 NOTE — Telephone Encounter (Signed)
Pt was seen this morning and was prescribed Tramdol. Pt called pharmacy for pick up and they stated they had not recieved the request. Please give pt a call.

## 2018-01-31 NOTE — Telephone Encounter (Signed)
Rx sent to pharmacy   

## 2018-02-03 NOTE — Progress Notes (Signed)
   HPI: 77 year old male presenting today with a chief complaint of severe pain to the lateral right 5th toe and ball of foot that began 2-3 weeks ago. He states he was seen by Dr. Gershon Mussel and referred here. He states it feels like a bone is sticking out of the foot. He has not had any treatment. Walking increases the pain. Patient is here for further evaluation and treatment.   Past Medical History:  Diagnosis Date  . Aortic stenosis 2008  . Arthritis   . Blood transfusion   . Blood transfusion without reported diagnosis   . CAD (coronary artery disease) 2008  . Complication of anesthesia    hallucinated once  . H/O hiatal hernia   . 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 Hx of colonic polyps   . Hyperlipidemia   . Hypertension      Physical Exam: General: The patient is alert and oriented x3 in no acute distress.  Dermatology: Skin is warm, dry and supple bilateral lower extremities. Negative for open lesions or macerations.  Vascular: Palpable pedal pulses bilaterally. No edema or erythema noted. Capillary refill within normal limits.  Neurological: Epicritic and protective threshold grossly intact bilaterally.   Musculoskeletal Exam: Pain with palpation to the 5th metatarsal head of the right foot. Range of motion within normal limits to all pedal and ankle joints bilateral. Muscle strength 5/5 in all groups bilateral.   Radiographic Exam:  Prominent 5th metatarsal had noted. Metatarsal head resections noted to digits 3 and 4 stable. Orthopedic hardware intact.    Assessment: 1. Prominent, symptomatic 5th metatarsal head right    Plan of Care:  1. Patient evaluated. X-Rays reviewed.  2. Today we discussed the conservative versus surgical management of the presenting pathology. The patient opts for surgical management. All possible complications and details of the procedure were explained. All patient questions were answered. No guarantees were expressed or  implied. 3. Authorization for surgery was initiated today. Surgery will consist of 5th metatarsal head resection right.  4. Prescription for Tramadol 50 mg #60 provided to patient.  5. Return to clinic one week post op.       Edrick Kins, DPM [facility]  Dr. Edrick Kins, DPM    [address], [place] [address]                Office [phone]  Fax [phone]

## 2018-02-07 ENCOUNTER — Telehealth: Payer: Self-pay | Admitting: *Deleted

## 2018-02-07 NOTE — Telephone Encounter (Signed)
Patient came to appointment late (959) , in touch with schedulers to reschedule and patient opted to cancel related to pending foot surgery.Patient will call back to reschedule

## 2018-02-07 NOTE — Telephone Encounter (Signed)
Patient no showed for previsit appointment today. Called the patient to reschedule the appointment with instructions including the # to call and if we do not hear from him by 5 pm, the procedure will be cancelled.

## 2018-02-17 ENCOUNTER — Other Ambulatory Visit: Payer: Self-pay | Admitting: Podiatry

## 2018-02-17 ENCOUNTER — Telehealth: Payer: Self-pay | Admitting: *Deleted

## 2018-02-17 ENCOUNTER — Other Ambulatory Visit: Payer: Medicare Other | Admitting: *Deleted

## 2018-02-17 DIAGNOSIS — Z79899 Other long term (current) drug therapy: Secondary | ICD-10-CM

## 2018-02-17 DIAGNOSIS — I493 Ventricular premature depolarization: Secondary | ICD-10-CM | POA: Diagnosis not present

## 2018-02-17 LAB — TSH: TSH: 10.07 u[IU]/mL — AB (ref 0.450–4.500)

## 2018-02-17 MED ORDER — HYDROCODONE-ACETAMINOPHEN 5-325 MG PO TABS: 1.0000 | ORAL_TABLET | Freq: Three times a day (TID) | ORAL | 0 refills | Status: DC | PRN

## 2018-02-17 NOTE — Telephone Encounter (Signed)
Luis Herrera - Surgical Coordinator transferred pt to me. Pt states he is to have surgery 03/03/2018 with Dr. Amalia Hailey, but the prrescribed Tramadol does not help his pain which is unbearable. I told pt I would send a message to Dr. Amalia Hailey, but if he could tolerate ibuprofen he could take that. Pt states he has been taking the ibuprofen but how much could he take. I told pt he could take ibuprofen 200mg  2 tablets every 6-8 hours. Pt states he will try the ibuprofen and to also check with Dr. Amalia Hailey concerning pain medication.

## 2018-02-17 NOTE — Telephone Encounter (Signed)
Just sent him some vicodin

## 2018-02-17 NOTE — Telephone Encounter (Signed)
"  I am scheduled for surgery on January 16 with Dr. Amalia Hailey.  I am calling to see if you have anything sooner.  My foot is killing me."  Dr. Amalia Hailey does not have anything sooner.  "Can you put me down to be called if he has any cancellations?"  I will put you on my wait list.  "I'd appreciate it.  The Tramadol he has me taking doesn't help."  Let me transfer you to the nurse.  I can't help you with that, she'll be able to assist you.  I transferred him to Dessie Coma., RN.

## 2018-02-17 NOTE — Progress Notes (Signed)
Pain mgmt

## 2018-02-18 ENCOUNTER — Telehealth: Payer: Self-pay | Admitting: *Deleted

## 2018-02-18 ENCOUNTER — Encounter: Payer: Self-pay | Admitting: *Deleted

## 2018-02-18 NOTE — Telephone Encounter (Signed)
Left message on pt's home phone informing that Dr. Amalia Hailey had sent Vicodin to pharmacy.

## 2018-02-18 NOTE — Progress Notes (Signed)
I sent a medical clearance letter for surgery to Drs. [name] and Cristopher Peru per Dr. Amalia Hailey.

## 2018-02-18 NOTE — Telephone Encounter (Signed)
   [facility] Pre-operative Risk Assessment    Request for surgical clearance:  1. What type of surgery is being performed? 5TH METATARSAL HEAD RESECTION ON THE RIGHT FOOT    2. When is this surgery scheduled? 03/03/18   3. What type of clearance is required (medical clearance vs. Pharmacy clearance to hold med vs. Both)? MEDICAL  4. Are there any medications that need to be held prior to surgery and how long?ASA   5. Practice name and name of physician performing surgery? [facility] ; DR. Ruby Cola EVANS  6. What is your office phone number [phone]    7.   What is your office fax number [phone]  8.   Anesthesia type (None, local, MAC, general) ? GENERAL    Julaine Hua 02/18/2018, 4:57 PM  _________________________________________________________________   (provider comments below)

## 2018-02-21 ENCOUNTER — Encounter: Payer: Medicare Other | Admitting: Gastroenterology

## 2018-02-22 ENCOUNTER — Telehealth: Payer: Self-pay

## 2018-02-22 MED ORDER — LEVOTHYROXINE SODIUM 50 MCG PO TABS: 50.0000 ug | ORAL_TABLET | Freq: Every day | ORAL | 3 refills | Status: DC

## 2018-02-22 NOTE — Telephone Encounter (Signed)
Will fwd to Dr. Harrington Challenger first for recommendations regarding ASA.  Patient will then need phone call to assess clinical change since last OV.  Dr Harrington Challenger >> Is it ok for Luis Herrera to hold ASA for 7 days?  Richardson Dopp, PA-C    02/22/2018 5:28 PM

## 2018-02-22 NOTE — Telephone Encounter (Signed)
-----   Message from Evans Lance, MD sent at 02/19/2018  8:58 PM EST ----- He needs to double his dose of synthroid. Recheck TSH in 3 months.

## 2018-02-23 ENCOUNTER — Telehealth: Payer: Self-pay | Admitting: *Deleted

## 2018-02-23 NOTE — Telephone Encounter (Signed)
Dr Wilma Flavin This patient is scheduled for 5th metatarsal head resection on his rt. Foot , under general anesthesia on 03/09/2018. He has a Colonoscopy scheduled for 03/17/18 for hx of colon polyps. Should we reschedule this patient. Thanks, Margie Ege, RN

## 2018-02-23 NOTE — Telephone Encounter (Signed)
Called patient's home #, left message to call back to reschedule procedure and previsit.Message left. Called mobile # , spoke with patient. Patient agreed with plan to wait for recovery from foot surgery. Patient will call back once patient is cleared from surgery by surgeon.Recall placed in Epic for 3 months

## 2018-02-23 NOTE — Telephone Encounter (Signed)
I think that may be best to allow him some time to recover from his foot surgery, get his ambulation better which will help him with the prep. So, I think rescheduling at a later time in this light may be best, unless he feels strongly that he wishes to proceed if that time frame is best for him. Thanks

## 2018-02-24 NOTE — Telephone Encounter (Signed)
I am forwarding Dr. Harrington Challenger recommendations for ASA for pt's surgery.

## 2018-02-24 NOTE — Telephone Encounter (Signed)
OK to hold aspirin prior to surgery

## 2018-02-24 NOTE — Telephone Encounter (Signed)
   Primary Cardiologist: [name], MD  Chart reviewed as part of pre-operative protocol coverage. Patient was contacted 02/24/2018 in reference to pre-operative risk assessment for pending surgery as outlined below.  Jamiah Recore was last seen on 11/16/2017  by Lovena Le and on 09/02/2017 by Dr. Harrington Challenger .  Since that day, Colen Eltzroth has done well with no new cardiac complaints. He underwent a similar foot surgery in September and did well.  Therefore, based on ACC/AHA guidelines, the patient would be at acceptable risk for the planned procedure without further cardiovascular testing.   Per Dr. Harrington Challenger, [place] to hold aspirin prior to surgery, up to 7 days as needed.   I will route this recommendation to the requesting party via Epic fax function and remove from pre-op pool.  Please call with questions.  Daune Perch, NP 02/24/2018, 2:58 PM

## 2018-02-25 DIAGNOSIS — M722 Plantar fascial fibromatosis: Secondary | ICD-10-CM | POA: Diagnosis not present

## 2018-02-25 DIAGNOSIS — M71572 Other bursitis, not elsewhere classified, left ankle and foot: Secondary | ICD-10-CM | POA: Diagnosis not present

## 2018-02-25 DIAGNOSIS — M7732 Calcaneal spur, left foot: Secondary | ICD-10-CM | POA: Diagnosis not present

## 2018-03-03 ENCOUNTER — Encounter: Payer: Self-pay | Admitting: Podiatry

## 2018-03-03 ENCOUNTER — Other Ambulatory Visit: Payer: Self-pay | Admitting: Podiatry

## 2018-03-03 DIAGNOSIS — E7800 Pure hypercholesterolemia, unspecified: Secondary | ICD-10-CM | POA: Diagnosis not present

## 2018-03-03 DIAGNOSIS — M21541 Acquired clubfoot, right foot: Secondary | ICD-10-CM | POA: Diagnosis not present

## 2018-03-03 DIAGNOSIS — M25774 Osteophyte, right foot: Secondary | ICD-10-CM | POA: Diagnosis not present

## 2018-03-03 DIAGNOSIS — M898X7 Other specified disorders of bone, ankle and foot: Secondary | ICD-10-CM | POA: Diagnosis not present

## 2018-03-03 MED ORDER — OXYCODONE-ACETAMINOPHEN 5-325 MG PO TABS: 1.0000 | ORAL_TABLET | Freq: Four times a day (QID) | ORAL | 0 refills | Status: DC | PRN

## 2018-03-03 MED ORDER — IBUPROFEN 800 MG PO TABS: 800.0000 mg | ORAL_TABLET | Freq: Three times a day (TID) | ORAL | 0 refills | Status: DC | PRN

## 2018-03-03 NOTE — Progress Notes (Signed)
.  postop

## 2018-03-08 ENCOUNTER — Encounter: Payer: Self-pay | Admitting: Gastroenterology

## 2018-03-09 ENCOUNTER — Ambulatory Visit (INDEPENDENT_AMBULATORY_CARE_PROVIDER_SITE_OTHER): Payer: Medicare Other

## 2018-03-09 ENCOUNTER — Ambulatory Visit (INDEPENDENT_AMBULATORY_CARE_PROVIDER_SITE_OTHER): Payer: Medicare Other | Admitting: Podiatry

## 2018-03-09 VITALS — Temp 97.1°F

## 2018-03-09 DIAGNOSIS — M7751 Other enthesopathy of right foot: Secondary | ICD-10-CM | POA: Diagnosis not present

## 2018-03-09 DIAGNOSIS — Z9889 Other specified postprocedural states: Secondary | ICD-10-CM

## 2018-03-09 DIAGNOSIS — M216X1 Other acquired deformities of right foot: Secondary | ICD-10-CM | POA: Diagnosis not present

## 2018-03-16 ENCOUNTER — Ambulatory Visit (INDEPENDENT_AMBULATORY_CARE_PROVIDER_SITE_OTHER): Payer: Medicare Other | Admitting: Podiatry

## 2018-03-16 ENCOUNTER — Encounter: Payer: Self-pay | Admitting: Podiatry

## 2018-03-16 DIAGNOSIS — M216X1 Other acquired deformities of right foot: Secondary | ICD-10-CM

## 2018-03-16 DIAGNOSIS — Z9889 Other specified postprocedural states: Secondary | ICD-10-CM

## 2018-03-17 ENCOUNTER — Encounter: Payer: Medicare Other | Admitting: Gastroenterology

## 2018-03-20 NOTE — Progress Notes (Signed)
   Subjective:  Patient presents today status post 5th metatarsal head resection right. DOS: 03/03/2018. He states he is doing well. He denies any significant pain or modifying factors. He denies any new complaints. Patient is here for further evaluation and treatment.    Past Medical History:  Diagnosis Date  . Aortic stenosis 2008  . Arthritis   . Blood transfusion   . Blood transfusion without reported diagnosis   . CAD (coronary artery disease) 2008  . Complication of anesthesia    hallucinated once  . H/O hiatal hernia   . 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 Hx of colonic polyps   . Hyperlipidemia   . Hypertension       Objective/Physical Exam Neurovascular status intact.  Skin incisions appear to be well coapted with sutures and staples intact. No sign of infectious process noted. No dehiscence. No active bleeding noted. Moderate edema noted to the surgical extremity.  Radiographic Exam:  Orthopedic hardware and osteotomies sites appear to be stable with routine healing.  Assessment: 1. s/p 5th metatarsal head resection right. DOS: 03/03/2018   Plan of Care:  1. Patient was evaluated. X-rays reviewed 2. Dressing changed.  3. Continue using post op shoe.  4. Return to clinic in one week.    Edrick Kins, DPM [facility]  Dr. Edrick Kins, [address]                                        [place], [place] [address]                Office [phone]  Fax [phone]

## 2018-03-21 ENCOUNTER — Encounter: Payer: Medicare Other | Admitting: Podiatry

## 2018-03-21 ENCOUNTER — Ambulatory Visit (INDEPENDENT_AMBULATORY_CARE_PROVIDER_SITE_OTHER): Payer: Medicare Other

## 2018-03-21 DIAGNOSIS — M216X1 Other acquired deformities of right foot: Secondary | ICD-10-CM

## 2018-03-21 DIAGNOSIS — Z9889 Other specified postprocedural states: Secondary | ICD-10-CM

## 2018-03-23 NOTE — Progress Notes (Signed)
   Subjective:  Patient presents today status post 5th metatarsal head resection right. DOS: 03/03/2018. He states he is doing well. He denies any significant pain or modifying factors. He denies any new complaints. Patient is here for further evaluation and treatment.    Past Medical History:  Diagnosis Date  . Aortic stenosis 2008  . Arthritis   . Blood transfusion   . Blood transfusion without reported diagnosis   . CAD (coronary artery disease) 2008  . Complication of anesthesia    hallucinated once  . H/O hiatal hernia   . 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 Hx of colonic polyps   . Hyperlipidemia   . Hypertension       Objective/Physical Exam Neurovascular status intact.  Skin incisions appear to be well coapted with sutures and staples intact. No sign of infectious process noted. No dehiscence. No active bleeding noted. Moderate edema noted to the surgical extremity.  Assessment: 1. s/p 5th metatarsal head resection right. DOS: 03/03/2018   Plan of Care:  1. Patient was evaluated.  2. Sutures removed.  3. Discontinue using post op shoe.  4. Resume wearing good shoe gear.  5. Return to clinic in 4 weeks.    Edrick Kins, DPM [facility]  Dr. Edrick Kins, [place]                                        [place], [place] [address]                Office [phone]  Fax [phone]

## 2018-03-31 ENCOUNTER — Other Ambulatory Visit: Payer: Self-pay | Admitting: Podiatry

## 2018-04-04 DIAGNOSIS — M722 Plantar fascial fibromatosis: Secondary | ICD-10-CM | POA: Diagnosis not present

## 2018-04-06 ENCOUNTER — Ambulatory Visit: Payer: Medicare Other

## 2018-04-06 VITALS — Ht 71.0 in | Wt 235.8 lb

## 2018-04-06 DIAGNOSIS — Z800 Family history of malignant neoplasm of digestive organs: Secondary | ICD-10-CM

## 2018-04-06 DIAGNOSIS — Z860100 Personal history of colon polyps, unspecified: Secondary | ICD-10-CM

## 2018-04-06 NOTE — Progress Notes (Signed)
No egg or soy allergy known to patient  No issues with past sedation with any surgeries  or procedures, no intubation problems  No diet pills per patient No home 02 use per patient  No blood thinners per patient  Pt denies issues with constipation  No A fib or A flutter  EMMI video sent to pt's e mail , pt declined    

## 2018-04-12 ENCOUNTER — Encounter (HOSPITAL_COMMUNITY): Payer: Self-pay | Admitting: Emergency Medicine

## 2018-04-12 ENCOUNTER — Telehealth: Payer: Self-pay | Admitting: Gastroenterology

## 2018-04-12 ENCOUNTER — Other Ambulatory Visit: Payer: Self-pay

## 2018-04-12 ENCOUNTER — Observation Stay (HOSPITAL_COMMUNITY)
Admission: EM | Admit: 2018-04-12 | Discharge: 2018-04-14 | Disposition: A | Discharge status: Home / Self Care | Payer: Medicare Other | Attending: Family Medicine | Admitting: Family Medicine

## 2018-04-12 ENCOUNTER — Emergency Department (HOSPITAL_COMMUNITY): Payer: Medicare Other

## 2018-04-12 DIAGNOSIS — Z87891 Personal history of nicotine dependence: Secondary | ICD-10-CM | POA: Insufficient documentation

## 2018-04-12 DIAGNOSIS — Z951 Presence of aortocoronary bypass graft: Secondary | ICD-10-CM | POA: Insufficient documentation

## 2018-04-12 DIAGNOSIS — I10 Essential (primary) hypertension: Secondary | ICD-10-CM | POA: Diagnosis not present

## 2018-04-12 DIAGNOSIS — I4510 Unspecified right bundle-branch block: Secondary | ICD-10-CM | POA: Diagnosis not present

## 2018-04-12 DIAGNOSIS — Z79899 Other long term (current) drug therapy: Secondary | ICD-10-CM | POA: Diagnosis not present

## 2018-04-12 DIAGNOSIS — D649 Anemia, unspecified: Secondary | ICD-10-CM | POA: Diagnosis not present

## 2018-04-12 DIAGNOSIS — R0789 Other chest pain: Principal | ICD-10-CM

## 2018-04-12 DIAGNOSIS — E039 Hypothyroidism, unspecified: Secondary | ICD-10-CM | POA: Diagnosis not present

## 2018-04-12 DIAGNOSIS — I080 Rheumatic disorders of both mitral and aortic valves: Secondary | ICD-10-CM | POA: Insufficient documentation

## 2018-04-12 DIAGNOSIS — N390 Urinary tract infection, site not specified: Secondary | ICD-10-CM

## 2018-04-12 DIAGNOSIS — M1990 Unspecified osteoarthritis, unspecified site: Secondary | ICD-10-CM | POA: Diagnosis not present

## 2018-04-12 DIAGNOSIS — E785 Hyperlipidemia, unspecified: Secondary | ICD-10-CM | POA: Diagnosis not present

## 2018-04-12 DIAGNOSIS — I160 Hypertensive urgency: Secondary | ICD-10-CM | POA: Diagnosis present

## 2018-04-12 DIAGNOSIS — R079 Chest pain, unspecified: Secondary | ICD-10-CM | POA: Diagnosis not present

## 2018-04-12 DIAGNOSIS — I2510 Atherosclerotic heart disease of native coronary artery without angina pectoris: Secondary | ICD-10-CM

## 2018-04-12 DIAGNOSIS — Z953 Presence of xenogenic heart valve: Secondary | ICD-10-CM | POA: Insufficient documentation

## 2018-04-12 DIAGNOSIS — N3000 Acute cystitis without hematuria: Secondary | ICD-10-CM

## 2018-04-12 DIAGNOSIS — Z8249 Family history of ischemic heart disease and other diseases of the circulatory system: Secondary | ICD-10-CM | POA: Diagnosis not present

## 2018-04-12 DIAGNOSIS — Z7982 Long term (current) use of aspirin: Secondary | ICD-10-CM | POA: Diagnosis not present

## 2018-04-12 DIAGNOSIS — Z79890 Hormone replacement therapy (postmenopausal): Secondary | ICD-10-CM | POA: Insufficient documentation

## 2018-04-12 LAB — I-STAT TROPONIN, ED: Troponin i, poc: 0.02 ng/mL (ref 0.00–0.08)

## 2018-04-12 MED ORDER — ACETAMINOPHEN 500 MG PO TABS
1000.0000 mg | ORAL_TABLET | Freq: Once | ORAL | Status: AC
  Administered 2018-04-13: 1000 mg via ORAL
  Filled 2018-04-12: qty 2

## 2018-04-12 MED ORDER — NITROGLYCERIN 0.4 MG SL SUBL
0.4000 mg | SUBLINGUAL_TABLET | SUBLINGUAL | Status: DC | PRN
  Administered 2018-04-13 (×2): 0.4 mg via SUBLINGUAL
  Filled 2018-04-12 (×2): qty 1

## 2018-04-12 NOTE — Telephone Encounter (Signed)
Explained to pt he needs the Dulcolax 5 mg Laxative tablets not the 100 mg stool softener- he verbalized understanding and will get the correct medicine-  He asked why he is getting the extra prep and explained at the colon 2014 his prep was only fair and we are doing the 2 day prep to make sure he is completely cleaned out- he verbalized understanding  MariePV

## 2018-04-12 NOTE — ED Triage Notes (Signed)
Pt presents with HTN, home BPs as high as 216/77. Hx HTN, CAD, denies chest pain or headache, states he is usually asymptomatic. A&O x 4, no weakness noted.

## 2018-04-12 NOTE — ED Provider Notes (Signed)
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  States that he takes multiple blood pressure medications and has been compliant with them.  Took his nighttime medications around 8 PM.  States around 5:30 PM he felt lightheaded and checked his blood pressure and it was 206/75.  States he checked it again around 9 PM and it was 216/79.  States he had some pressure on the top of his head "like a headache".  States he had some pressure in the center of his chest and he decided to come to the emergency department.  Still having mild headache and chest pressure.  No shortness of breath.  No vision changes.  No numbness or focal weakness.  Patient is on carvedilol 6.25 mg twice daily, Lasix 40 mg once daily, amlodipine/olmesartan 5/40 mg, hydralazine 25 mg twice daily.  ROS: See HPI Constitutional: no fever  Eyes: no drainage  ENT: no runny nose   Cardiovascular:   chest pain  Resp: no SOB  GI: no vomiting GU: no dysuria Integumentary: no rash  Allergy: no hives  Musculoskeletal: no leg swelling  Neurological: no slurred speech ROS otherwise negative  PAST MEDICAL HISTORY/PAST SURGICAL HISTORY:  Past Medical History:  Diagnosis Date  . Aortic stenosis 2008  . Arthritis   . Blood transfusion   . Blood transfusion without reported diagnosis   . CAD (coronary artery disease) 2008  . Cataract   . Complication of anesthesia    hallucinated once  . H/O hiatal hernia   . 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 Hx of colonic polyps   . Hyperlipidemia   . Hypertension   . Thyroid disease     MEDICATIONS:  Prior to Admission medications   Medication Sig Start Date End Date Taking? Authorizing Provider  amiodarone (PACERONE) 200 MG tablet TAKE 1 TABLET BY MOUTH EVERY DAY 12/03/17   Evans Lance, MD  amLODipine-olmesartan (AZOR) 5-40 MG tablet Take 1 tablet by mouth daily.    [provider]  aspirin 81 MG tablet Take 81 mg by mouth daily.    [provider]  atorvastatin (LIPITOR) 80 MG tablet TAKE 1 TABLET (80 MG TOTAL) BY MOUTH DAILY. 08/03/17   Fay Records, MD  Calcium Carbonate-Vitamin D (CALCIUM-VITAMIN D) 500-200 MG-UNIT tablet Take 1 tablet by mouth daily.    [provider]  carvedilol (COREG) 6.25 MG tablet TAKE 1 TABLET (6.25 MG TOTAL) BY MOUTH 2 (TWO) TIMES DAILY. 11/08/17   Fay Records, MD  ezetimibe (ZETIA) 10 MG tablet Take 1 tablet (10 mg total) by mouth daily. 11/08/17   Fay Records, MD  furosemide (LASIX) 40 MG tablet TAKE 1 TABLET BY MOUTH EVERY DAY . TAKE ADDITIONAL 40MG  AS NEEDED FOR SWELLING AND OR WEIGHT 10/29/17   Fay Records, MD  hydrALAZINE (APRESOLINE) 25 MG tablet Take 1 tablet (25 mg total) by mouth 2 (two) times daily. 08/03/17   Fay Records, MD  ibuprofen (ADVIL,MOTRIN) 800 MG tablet TAKE 1 TABLET BY MOUTH EVERY 8 HOURS AS NEEDED 03/31/18   Edrick Kins, DPM  levothyroxine (SYNTHROID) 50 MCG tablet Take 1 tablet (50 mcg total) by mouth daily before breakfast. 02/22/18   Evans Lance, MD  Multiple Vitamin (MULTIVITAMIN WITH MINERALS) TABS Take 1 tablet by mouth daily.  [provider]  olmesartan (BENICAR) 40 MG tablet Take 40 mg by mouth daily. 10/29/17   [provider]  Spacer/Aero-Holding Chambers (AEROCHAMBER PLUS) inhaler Use as instructed to use with inahaler. Patient not taking: Reported on 04/06/2018 10/01/17   [place], Betty G, MD    ALLERGIES:  Allergies  Allergen Reactions  . Sulfamethoxazole-Trimethoprim Other (See Comments)    Carlyn Reichert Syndrome    SOCIAL HISTORY:  Social History   Tobacco Use  . Smoking status: Former Smoker    Last attempt to quit: 05/25/1973    Years since quitting: 44.9  . Smokeless tobacco: Never Used  Substance Use Topics  . Alcohol use: Not Currently     Comment: rare    FAMILY HISTORY: Family History  Problem Relation Age of Onset  . Colon cancer Mother 10  . Heart attack Father   . Hypertension Other   . Hyperlipidemia Other   . Heart attack Brother   . Hypertension Sister   . Hypertension Brother   . Stroke Neg Hx   . Esophageal cancer Neg Hx   . Rectal cancer Neg Hx   . Stomach cancer Neg Hx     EXAM: BP (!) 181/53   Pulse 72   Temp 97.9 F (36.6 C) (Oral)   Resp 18   SpO2 99%  CONSTITUTIONAL: Alert and oriented and responds appropriately to questions. Well-appearing; well-nourished HEAD: Normocephalic EYES: Conjunctivae clear, pupils appear equal, EOMI ENT: normal nose; moist mucous membranes NECK: Supple, no meningismus, no nuchal rigidity, no LAD  CARD: RRR; S1 and S2 appreciated; + systolic murmur, no clicks, no rubs, no gallops RESP: Normal chest excursion without splinting or tachypnea; breath sounds clear and equal bilaterally; no wheezes, no rhonchi, no rales, no hypoxia or respiratory distress, speaking full sentences ABD/GI: Normal bowel sounds; non-distended; soft, mild tenderness in the right upper quadrant and epigastric region, no rebound, no guarding, no peritoneal signs, no hepatosplenomegaly BACK:  The back appears normal and is non-tender to palpation, there is no CVA tenderness EXT: Normal ROM in all joints; non-tender to palpation; no edema; normal capillary refill; no cyanosis, no calf tenderness or swelling    SKIN: Normal color for age and race; warm; no rash NEURO: Moves all extremities equally strength 5/5 in all 4 extremities, cranial nerves II through XII intact, normal speech, normal sensation diffusely, no dysmetria to finger-to-nose testing bilaterally PSYCH: The patient's mood and manner are appropriate. Grooming and personal hygiene are appropriate.  MEDICAL DECISION MAKING: Patient here with hypertension.  Had mild headache and mild chest pressure as well as some lightheadedness.  Does not  check his blood pressures regularly at home.  Reports compliance with his multiple medications.  Blood pressure is already improving is currently in the 170s/50s on my evaluation.  Headache is not severe only described as very mild in nature.  No neurologic deficits.  Doubt stroke or intracranial hemorrhage.  He is having some chest pressure which she states is new for him.  Does have history of hypertension and hyperlipidemia.  EKG shows right bundle branch block with no ischemic changes compared to previous.  Will obtain labs, urine.  Will give nitroglycerin for chest pain and Tylenol for headache.  ED PROGRESS: Chest x-ray clear.  Troponin negative.  Chest pain and blood pressure have improved with nitroglycerin.  He reports he feels much better and chest pain is gone.  I have offered admission given he does have history of hypertension, hyperlipidemia, obesity and previous history  of tobacco use (quit 40 years ago).  He states he would prefer to go home as he has a colonoscopy scheduled this week.  He describes the chest pain is very mild and there was no associated shortness of breath, nausea, vomiting, diaphoresis or dizziness.  May have been related to his hypertension.  He agrees to stay for a second troponin in 3 hours.  If this is negative, he will be discharged with close follow-up with his cardiologist and he has been instructed to return if he has any return of chest pain.   4:05 AM  Pt's second troponin negative.  Patient now having return of chest pressure.  Blood pressures in the 150s to 160s/50s.  I do not think hypertension is the cause of his chest pain at this time given blood pressures improved.  Will give nitroglycerin again given this resolved his chest pain previously.  I have recommended admission again and family now agrees to this plan.  Will discuss with medicine for admission.  He also appears to have a UTI.  Will give IV ceftriaxone and send urine culture.   4:40 AM Discussed  patient's case with hospitalist, Dr. Marlowe Sax.  I have recommended admission and patient (and family if present) agree with this plan. Admitting physician will place admission orders.   I reviewed all nursing notes, vitals, pertinent previous records, EKGs, lab and urine results, imaging (as available).    EKG Interpretation  Date/Time:  Tuesday April 12 2018 22:39:28 EST Ventricular Rate:  77 PR Interval:  182 QRS Duration: 154 QT Interval:  454 QTC Calculation: 513 R Axis:   -32 Text Interpretation:  Normal sinus rhythm Left axis deviation Right bundle branch block Left ventricular hypertrophy Inferior infarct , age undetermined Abnormal ECG No significant change since last tracing Confirmed by Geoff Dacanay, Cyril Mourning [phone]) on 04/12/2018 11:33:44 PM         Cedrik Heindl, Delice Bison, DO 04/13/18 0076

## 2018-04-12 NOTE — Telephone Encounter (Signed)
Pt is scheduled for colonoscopy 2.27.20.  Pt's prep instruction stated to take dulcolax 5 mg but pt only has the 100 mg.  Please advise.

## 2018-04-12 NOTE — ED Notes (Signed)
Patient transported to X-ray 

## 2018-04-13 ENCOUNTER — Telehealth: Payer: Self-pay | Admitting: Gastroenterology

## 2018-04-13 ENCOUNTER — Observation Stay (HOSPITAL_BASED_OUTPATIENT_CLINIC_OR_DEPARTMENT_OTHER): Payer: Medicare Other

## 2018-04-13 DIAGNOSIS — M1990 Unspecified osteoarthritis, unspecified site: Secondary | ICD-10-CM | POA: Diagnosis not present

## 2018-04-13 DIAGNOSIS — Z8249 Family history of ischemic heart disease and other diseases of the circulatory system: Secondary | ICD-10-CM | POA: Diagnosis not present

## 2018-04-13 DIAGNOSIS — E785 Hyperlipidemia, unspecified: Secondary | ICD-10-CM | POA: Diagnosis not present

## 2018-04-13 DIAGNOSIS — I340 Nonrheumatic mitral (valve) insufficiency: Secondary | ICD-10-CM

## 2018-04-13 DIAGNOSIS — R0789 Other chest pain: Secondary | ICD-10-CM | POA: Diagnosis not present

## 2018-04-13 DIAGNOSIS — E039 Hypothyroidism, unspecified: Secondary | ICD-10-CM | POA: Diagnosis not present

## 2018-04-13 DIAGNOSIS — I25110 Atherosclerotic heart disease of native coronary artery with unstable angina pectoris: Secondary | ICD-10-CM

## 2018-04-13 DIAGNOSIS — Z87891 Personal history of nicotine dependence: Secondary | ICD-10-CM | POA: Diagnosis not present

## 2018-04-13 DIAGNOSIS — N390 Urinary tract infection, site not specified: Secondary | ICD-10-CM

## 2018-04-13 DIAGNOSIS — I160 Hypertensive urgency: Secondary | ICD-10-CM | POA: Diagnosis present

## 2018-04-13 DIAGNOSIS — I2510 Atherosclerotic heart disease of native coronary artery without angina pectoris: Secondary | ICD-10-CM | POA: Diagnosis not present

## 2018-04-13 DIAGNOSIS — D649 Anemia, unspecified: Secondary | ICD-10-CM | POA: Diagnosis not present

## 2018-04-13 DIAGNOSIS — R079 Chest pain, unspecified: Secondary | ICD-10-CM | POA: Diagnosis not present

## 2018-04-13 DIAGNOSIS — Z953 Presence of xenogenic heart valve: Secondary | ICD-10-CM | POA: Diagnosis not present

## 2018-04-13 DIAGNOSIS — Z951 Presence of aortocoronary bypass graft: Secondary | ICD-10-CM | POA: Diagnosis not present

## 2018-04-13 LAB — URINALYSIS, ROUTINE W REFLEX MICROSCOPIC
Bilirubin Urine: NEGATIVE
Glucose, UA: NEGATIVE mg/dL
Hgb urine dipstick: NEGATIVE
Ketones, ur: NEGATIVE mg/dL
NITRITE: POSITIVE — AB
Protein, ur: NEGATIVE mg/dL
SPECIFIC GRAVITY, URINE: 1.021 (ref 1.005–1.030)
pH: 5 (ref 5.0–8.0)

## 2018-04-13 LAB — ECHOCARDIOGRAM COMPLETE
Height: 71 in
Weight: 3686.09 oz

## 2018-04-13 LAB — COMPREHENSIVE METABOLIC PANEL
ALT: 24 U/L (ref 0–44)
ANION GAP: 10 (ref 5–15)
AST: 29 U/L (ref 15–41)
Albumin: 3.1 g/dL — ABNORMAL LOW (ref 3.5–5.0)
Alkaline Phosphatase: 54 U/L (ref 38–126)
BUN: 26 mg/dL — ABNORMAL HIGH (ref 8–23)
CO2: 25 mmol/L (ref 22–32)
Calcium: 8.5 mg/dL — ABNORMAL LOW (ref 8.9–10.3)
Chloride: 106 mmol/L (ref 98–111)
Creatinine, Ser: 1.05 mg/dL (ref 0.61–1.24)
GFR calc Af Amer: >60 mL/min (ref >60)
GFR calc non Af Amer: >60 mL/min (ref >60)
Glucose, Bld: 108 mg/dL — ABNORMAL HIGH (ref 70–99)
Potassium: 4.3 mmol/L (ref 3.5–5.1)
Sodium: 141 mmol/L (ref 135–145)
TOTAL PROTEIN: 5.9 g/dL — AB (ref 6.5–8.1)
Total Bilirubin: 0.8 mg/dL (ref 0.3–1.2)

## 2018-04-13 LAB — LIPASE, BLOOD: Lipase: 65 U/L — ABNORMAL HIGH (ref 11–51)

## 2018-04-13 LAB — CBC WITH DIFFERENTIAL/PLATELET
Abs Immature Granulocytes: 0.05 10*3/uL (ref 0.00–0.07)
BASOS ABS: 0 10*3/uL (ref 0.0–0.1)
Basophils Relative: 0 %
Eosinophils Absolute: 0.3 10*3/uL (ref 0.0–0.5)
Eosinophils Relative: 3 %
HCT: 37.2 % — ABNORMAL LOW (ref 39.0–52.0)
Hemoglobin: 11.9 g/dL — ABNORMAL LOW (ref 13.0–17.0)
Immature Granulocytes: 1 %
LYMPHS PCT: 15 %
Lymphs Abs: 1.5 10*3/uL (ref 0.7–4.0)
MCH: 31.3 pg (ref 26.0–34.0)
MCHC: 32 g/dL (ref 30.0–36.0)
MCV: 97.9 fL (ref 80.0–100.0)
Monocytes Absolute: 0.8 10*3/uL (ref 0.1–1.0)
Monocytes Relative: 8 %
Neutro Abs: 7 10*3/uL (ref 1.7–7.7)
Neutrophils Relative %: 73 %
Platelets: 206 10*3/uL (ref 150–400)
RBC: 3.8 MIL/uL — ABNORMAL LOW (ref 4.22–5.81)
RDW: 14.4 % (ref 11.5–15.5)
WBC: 9.7 10*3/uL (ref 4.0–10.5)
nRBC: 0 % (ref 0.0–0.2)

## 2018-04-13 LAB — I-STAT TROPONIN, ED: Troponin i, poc: 0.02 ng/mL (ref 0.00–0.08)

## 2018-04-13 LAB — TROPONIN I
Troponin I: <0.03 ng/mL (ref <0.03)
Troponin I: <0.03 ng/mL (ref <0.03)

## 2018-04-13 MED ORDER — ASPIRIN 81 MG PO CHEW
324.0000 mg | CHEWABLE_TABLET | Freq: Once | ORAL | Status: AC
  Administered 2018-04-13: 324 mg via ORAL
  Filled 2018-04-13: qty 4

## 2018-04-13 MED ORDER — CARVEDILOL 6.25 MG PO TABS
6.2500 mg | ORAL_TABLET | Freq: Two times a day (BID) | ORAL | Status: DC
  Administered 2018-04-13 – 2018-04-14 (×3): 6.25 mg via ORAL
  Filled 2018-04-13 (×3): qty 1

## 2018-04-13 MED ORDER — SODIUM CHLORIDE 0.9 % IV SOLN
1.0000 g | INTRAVENOUS | Status: DC
  Administered 2018-04-14: 1 g via INTRAVENOUS
  Filled 2018-04-13: qty 10

## 2018-04-13 MED ORDER — ASPIRIN 81 MG PO CHEW
81.0000 mg | CHEWABLE_TABLET | Freq: Every day | ORAL | Status: DC
  Administered 2018-04-13: 81 mg via ORAL
  Filled 2018-04-13: qty 1

## 2018-04-13 MED ORDER — NITROGLYCERIN 0.4 MG SL SUBL: 0.4000 mg | SUBLINGUAL_TABLET | SUBLINGUAL | Status: DC | PRN

## 2018-04-13 MED ORDER — FUROSEMIDE 40 MG PO TABS
40.0000 mg | ORAL_TABLET | Freq: Every day | ORAL | Status: DC
  Administered 2018-04-13 – 2018-04-14 (×2): 40 mg via ORAL
  Filled 2018-04-13 (×2): qty 1

## 2018-04-13 MED ORDER — ENOXAPARIN SODIUM 40 MG/0.4ML ~~LOC~~ SOLN
40.0000 mg | SUBCUTANEOUS | Status: DC
  Administered 2018-04-13 – 2018-04-14 (×2): 40 mg via SUBCUTANEOUS
  Filled 2018-04-13 (×2): qty 0.4

## 2018-04-13 MED ORDER — LISINOPRIL 10 MG PO TABS
10.0000 mg | ORAL_TABLET | Freq: Every day | ORAL | Status: DC
  Administered 2018-04-13 – 2018-04-14 (×2): 10 mg via ORAL
  Filled 2018-04-13 (×2): qty 1

## 2018-04-13 MED ORDER — AMIODARONE HCL 200 MG PO TABS
200.0000 mg | ORAL_TABLET | Freq: Every day | ORAL | Status: DC
  Administered 2018-04-13 – 2018-04-14 (×2): 200 mg via ORAL
  Filled 2018-04-13 (×2): qty 1

## 2018-04-13 MED ORDER — ADULT MULTIVITAMIN W/MINERALS CH
1.0000 | ORAL_TABLET | Freq: Every day | ORAL | Status: DC
  Administered 2018-04-13 – 2018-04-14 (×2): 1 via ORAL
  Filled 2018-04-13 (×2): qty 1

## 2018-04-13 MED ORDER — CALCIUM CARBONATE-VITAMIN D 500-200 MG-UNIT PO TABS
1.0000 | ORAL_TABLET | Freq: Every day | ORAL | Status: DC
  Administered 2018-04-13 – 2018-04-14 (×2): 1 via ORAL
  Filled 2018-04-13 (×2): qty 1

## 2018-04-13 MED ORDER — ATORVASTATIN CALCIUM 80 MG PO TABS
80.0000 mg | ORAL_TABLET | Freq: Every day | ORAL | Status: DC
  Administered 2018-04-13: 80 mg via ORAL
  Filled 2018-04-13: qty 1

## 2018-04-13 MED ORDER — SODIUM CHLORIDE 0.9 % IV SOLN
1.0000 g | Freq: Once | INTRAVENOUS | Status: AC
  Administered 2018-04-13: 1 g via INTRAVENOUS
  Filled 2018-04-13: qty 10

## 2018-04-13 MED ORDER — ACETAMINOPHEN 325 MG PO TABS: 650.0000 mg | ORAL_TABLET | ORAL | Status: DC | PRN

## 2018-04-13 MED ORDER — AMLODIPINE BESYLATE 5 MG PO TABS
5.0000 mg | ORAL_TABLET | Freq: Every day | ORAL | Status: DC
  Administered 2018-04-13 – 2018-04-14 (×2): 5 mg via ORAL
  Filled 2018-04-13 (×2): qty 1

## 2018-04-13 MED ORDER — ONDANSETRON HCL 4 MG/2ML IJ SOLN: 4.0000 mg | Freq: Four times a day (QID) | INTRAMUSCULAR | Status: DC | PRN

## 2018-04-13 MED ORDER — EZETIMIBE 10 MG PO TABS
10.0000 mg | ORAL_TABLET | Freq: Every day | ORAL | Status: DC
  Administered 2018-04-13 – 2018-04-14 (×2): 10 mg via ORAL
  Filled 2018-04-13 (×2): qty 1

## 2018-04-13 MED ORDER — LEVOTHYROXINE SODIUM 50 MCG PO TABS
50.0000 ug | ORAL_TABLET | Freq: Every day | ORAL | Status: DC
  Administered 2018-04-13 – 2018-04-14 (×2): 50 ug via ORAL
  Filled 2018-04-13 (×2): qty 1

## 2018-04-13 MED ORDER — HYDRALAZINE HCL 25 MG PO TABS
25.0000 mg | ORAL_TABLET | Freq: Two times a day (BID) | ORAL | Status: DC
  Administered 2018-04-13 – 2018-04-14 (×3): 25 mg via ORAL
  Filled 2018-04-13 (×3): qty 1

## 2018-04-13 NOTE — Consult Note (Addendum)
Cardiology Consultation:   Patient ID: Luis Herrera; [id number]; 01/29/1941   Admit date: 04/12/2018 Date of Consult: 04/13/2018  Primary Care Provider: Martinique, Betty G, MD Primary Cardiologist: Dr. Wyatt Haste, MD Primary Electrophysiologist: Dr. Cristopher Peru, MD  Patient Profile:   Luis Herrera is a 78 y.o. 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 Marlowe Sax.  History of Present Illness:   Mr. Loftus is a 78 year old male with a history stated above who presented to [facility] on 04/12/2018 with complaints of high blood pressure and chest pain/pressure.  Patient states that on the evening prior to admission he went upstairs to go to bed and decided to check his BP. Using his home monitor, he states that his pressure was 130 systolic. He then laid down and tried to go to sleep and began having mid-sternal, non-radiationg chest pain. Given this, he went downstairs and re-checked his pressure which was noted to have elevated even more to 865 systolic. He told his wife that maybe he should go to the ED for further evaluation and he drove himself here on 04/12/2018. Prior to this he reports that he was in his usual state of health however his family is in the room including his wife, son and daughter who all state that he does not tell anyone when he does not feel well. He reported to them recently that while in church for the last several Sundays, he will be sitting in church and will be able to "feel his pulse", as if his pressure was extremely elevated. He denies pain during those times. He denies chest pain currently and states that he had not felt any since his surgery. He denies associated symptoms such as N/V, dyspnea or diaphoresis.  He denies orthopnea symptoms, dizziness, presyncopal or syncopal episodes.  He has chronic LE swelling for which he take PO Lasix 40mg  daily and it is well controlled. He denies tobacco, alcohol or illicit drug use. He is medication compliant.   In the ED, EKG was without acute changes and similar to prior tracings. He was given SL NTG with relief in the emergency department. CXR with no active cardiopulmonary disease. Initial i-STAT troponin negative with subsequent follow-up troponin levels also negative. BP was noted to be extremely elevated on presentation at 217/58 and continues to be labile ranging from 136/47-193/65. In reviewing his chart, it appears that his BP at last follow up with Dr. Harrington Challenger was noted to be 130/54. When he was seen by Dr. Lovena Le several months later, it was 126/64. He had recurrent CP after being admitted in which he was once again given SL NTG with relief. He is chest pain free on my interview.   He was last seen by Dr. Lovena Le 11/16/2017 for follow-up of his PVCs.  He was noted to have been placed on amiodarone  (takes 200mg  M-F and does not take on Sat/Sun) and his PVCs were reduced from 40% down to 9% per Holter monitor.  At that time, he was feeling well and he remained active. Was noted to have chronic peripheral edema.  He was last seen by his primary cardiologist 08/10/2017. Echocardiogram from 02/2016 with an LVEF which was noted to be normal and a normally functioning AV prosthesis.  He wore a Holter monitor which showed 13% PVCs with  recommendations for increasing Coreg to 6.25 mg twice daily.  He underwent a Myoview scan that showed no ischemia.  He was eventually referred to EP in 2018 in which amniodarone was added to his regimen with a reduction in PVCs down to 9%.  Past Medical History:  Diagnosis Date  . Aortic stenosis 2008  . Arthritis   . Blood transfusion   . Blood transfusion without reported diagnosis   . CAD (coronary artery disease) 2008  . Cataract   . Complication of anesthesia    hallucinated once  . H/O hiatal hernia   .  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 Hx of colonic polyps   . Hyperlipidemia   . Hypertension   . Thyroid disease     Past Surgical History:  Procedure Laterality Date  . AORTIC VALVE REPLACEMENT  2009   Bioprosthetic at [facility]  2008  . CHOLECYSTECTOMY  1982  . COLONOSCOPY    . CORONARY ARTERY BYPASS GRAFT  2009   SVG-PDA w/ AVR  . CORRECTION HAMMER TOE  2011   BILATERAL  . 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 POLYPECTOMY    . TONSILLECTOMY    . TOTAL HIP ARTHROPLASTY Left 2000  . TOTAL KNEE ARTHROPLASTY  2000   left  . TOTAL KNEE ARTHROPLASTY  12/01/2011   Procedure: TOTAL KNEE ARTHROPLASTY;  Surgeon: Mauri Pole, MD;  Location: [facility];  Service: Orthopedics;  Laterality: Right;  . WEIL OSTEOTOMY  05/28/2011     Prior to Admission medications   Medication Sig Start Date End Date Taking? Authorizing Provider  amiodarone (PACERONE) 200 MG tablet TAKE 1 TABLET BY MOUTH EVERY DAY Patient taking differently: Take 200 mg by mouth daily.  12/03/17  Yes Evans Lance, MD  amLODipine-olmesartan (AZOR) 5-40 MG tablet Take 1 tablet by mouth daily.   Yes [provider]  aspirin 81 MG tablet Take 81 mg by mouth at bedtime.    Yes [provider]  atorvastatin (LIPITOR) 80 MG tablet TAKE 1 TABLET (80 MG TOTAL) BY MOUTH DAILY. Patient taking differently: Take 80 mg by mouth at bedtime.  08/03/17  Yes Fay Records, MD  Calcium Carbonate-Vitamin D (CALCIUM-VITAMIN D) 500-200 MG-UNIT tablet Take 1 tablet by mouth daily.   Yes [provider]  carvedilol (COREG) 6.25 MG tablet TAKE 1 TABLET (6.25 MG TOTAL) BY MOUTH 2 (TWO) TIMES DAILY. 11/08/17  Yes Fay Records, MD  ezetimibe (ZETIA) 10 MG tablet Take 1 tablet (10 mg total) by mouth daily. 11/08/17  Yes Fay Records, MD  furosemide (LASIX) 40 MG tablet TAKE 1 TABLET BY MOUTH EVERY DAY . TAKE ADDITIONAL 40MG  AS NEEDED FOR SWELLING AND OR  WEIGHT Patient taking differently: Take 40 mg by mouth daily.  10/29/17  Yes Fay Records, MD  hydrALAZINE (APRESOLINE) 25 MG tablet Take 1 tablet (25 mg total) by mouth 2 (two) times daily. 08/03/17  Yes Fay Records, MD  ibuprofen (ADVIL,MOTRIN) 800 MG tablet TAKE 1 TABLET BY MOUTH EVERY 8 HOURS AS NEEDED Patient taking differently: Take 800 mg by mouth every 8 (eight) hours as needed for mild pain.  03/31/18  Yes Edrick Kins, DPM  levothyroxine (SYNTHROID) 50 MCG tablet Take 1 tablet (50 mcg total) by mouth daily before breakfast. 02/22/18  Yes Evans Lance, MD  Multiple Vitamin (MULTIVITAMIN WITH MINERALS) TABS Take 1 tablet by mouth daily.  Yes [provider]  olmesartan (BENICAR) 40 MG tablet Take 40 mg by mouth daily. 10/29/17  Yes [provider]  Spacer/Aero-Holding Chambers (AEROCHAMBER PLUS) inhaler Use as instructed to use with inahaler. 10/01/17   Martinique, Betty G, MD    Inpatient Medications: Scheduled Meds: . amiodarone  200 mg Oral Daily  . amLODipine  5 mg Oral Daily  . aspirin  81 mg Oral QHS  . atorvastatin  80 mg Oral QHS  . calcium-vitamin D  1 tablet Oral Daily  . carvedilol  6.25 mg Oral BID  . enoxaparin (LOVENOX) injection  40 mg Subcutaneous Q24H  . ezetimibe  10 mg Oral Daily  . hydrALAZINE  25 mg Oral BID  . levothyroxine  50 mcg Oral Q0600  . multivitamin with minerals  1 tablet Oral Daily   Continuous Infusions: . [START ON 04/14/2018] cefTRIAXone (ROCEPHIN)  IV     PRN Meds: acetaminophen, nitroGLYCERIN, ondansetron (ZOFRAN) IV  Allergies:    Allergies  Allergen Reactions  . Sulfamethoxazole-Trimethoprim Other (See Comments)    Carlyn Reichert Syndrome    Social History:   Social History   Socioeconomic History  . Marital status: Married    Spouse name: Not on file  . Number of children: Not on file  . Years of education: Not on file  . Highest education level: Not on file  Occupational History  . Occupation: retired    Scientific laboratory technician  . Financial resource strain: Not on file  . Food insecurity:    Worry: Not on file    Inability: Not on file  . Transportation needs:    Medical: Not on file    Non-medical: Not on file  Tobacco Use  . Smoking status: Former Smoker    Last attempt to quit: 05/25/1973    Years since quitting: 44.9  . Smokeless tobacco: Never Used  Substance and Sexual Activity  . Alcohol use: Not Currently    Comment: rare  . Drug use: No  . Sexual activity: Not Currently  Lifestyle  . Physical activity:    Days per week: Not on file    Minutes per session: Not on file  . Stress: Not on file  Relationships  . Social connections:    Talks on phone: Not on file    Gets together: Not on file    Attends religious service: Not on file    Active member of club or organization: Not on file    Attends meetings of clubs or organizations: Not on file    Relationship status: Not on file  . Intimate partner violence:    Fear of current or ex partner: Not on file    Emotionally abused: Not on file    Physically abused: Not on file    Forced sexual activity: Not on file  Other Topics Concern  . Not on file  Social History Narrative   Regular exercise-yes. Pt lives in [place] with his wife.    Family History:   Family History  Problem Relation Age of Onset  . Colon cancer Mother 37  . Heart attack Father   . Hypertension Other   . Hyperlipidemia Other   . Heart attack Brother   . Hypertension Sister   . Hypertension Brother   . Stroke Neg Hx   . Esophageal cancer Neg Hx   . Rectal cancer Neg Hx   . Stomach cancer Neg Hx    Family Status:  Family Status  Relation  Name Status  . MGM  Deceased  . MGF  Deceased  . PGM  Deceased  . PGF  Deceased  . Mother  Deceased  . Father  Deceased  . Other  (Not Specified)  . Other  (Not Specified)  . Brother  (Not Specified)  . Sister  (Not Specified)  . Brother  (Not Specified)  . Neg Hx  (Not Specified)    ROS:   Please see the history of present illness.  All other ROS reviewed and negative.     Physical Exam/Data:   Vitals:   04/13/18 0600 04/13/18 0652 04/13/18 0842 04/13/18 0845  BP: (!) 156/53 (!) 189/47 (!) 193/65 (!) 174/58  Pulse: (!) 57 64 63 (!) 57  Resp: 17 16 (!) 23 15  Temp:  99.2 F (37.3 C) 97.6 F (36.4 C)   TempSrc:  Oral Oral   SpO2: 97% 98% 97% 97%  Weight:  104.5 kg    Height:  5\' 11"  (1.803 m)      Intake/Output Summary (Last 24 hours) at 04/13/2018 1211 Last data filed at 04/13/2018 0948 Gross per 24 hour  Intake 140 ml  Output -  Net 140 ml   Filed Weights   04/13/18 0652  Weight: 104.5 kg   Body mass index is 32.13 kg/m.   General: Overweight, NAD Skin: Warm, dry, intact  Head: Normocephalic, atraumatic, clear, moist mucus membranes. Neck: Negative for carotid bruits. No JVD Lungs:Clear to ausculation bilaterally. No wheezes, rales, or rhonchi. Breathing is unlabored. Cardiovascular: RRR with S1 S2. No murmurs, rubs, gallops, or LV heave appreciated. Abdomen: Soft, non-tender, non-distended with normoactive bowel sounds. No obvious abdominal masses. MSK: Strength and tone appear normal for age. 5/5 in all extremities Extremities: 2+ LE edema. No clubbing or cyanosis. DP/PT pulses 1+ bilaterally Neuro: Alert and oriented. No focal deficits. No facial asymmetry. MAE spontaneously. Psych: Responds to questions appropriately with normal affect.     EKG:  The EKG was personally reviewed and demonstrates: 04/13/2018 NSR with RBBB and evidence of left ventricular hypertrophy and no acute changes when compared to prior tracings Telemetry:  Telemetry was personally reviewed and demonstrates: 04/13/2018 NSR   Relevant CV Studies:  ECHO: 03/10/2016: Study Conclusions  - Left ventricle: The cavity size was normal. Wall thickness was   increased in a pattern of mild LVH. Systolic function was   vigorous. The estimated ejection fraction was in the range of  65%   to 70%. Wall motion was normal; there were no regional wall   motion abnormalities. Doppler parameters are consistent with   abnormal left ventricular relaxation (grade 1 diastolic   dysfunction). The E/e&' ratio is between 8-15, suggesting   indeterminate LV filling pressure. - Aortic valve: Bioprosthetic AVR, no obstruction. There was   trivial regurgitation. Mean gradient (S): 17 mm Hg. Peak gradient   (S): 30 mm Hg. - Mitral valve: Calcified annulus. Mildly thickened leaflets .   There was trivial regurgitation. - Left atrium: Severely dilated. - Right ventricle: The cavity size was mildly dilated. Systolic   function is reduced. - Right atrium: The atrium was mildly dilated. - Inferior vena cava: The vessel was normal in size. The   respirophasic diameter changes were in the normal range (>= 50%),   consistent with normal central venous pressure.  Impressions:  - Compared to a prior study in 05/2015, the LVEF is higher at   65-70%. The aortic bioprosthesis is working normally.  Nuclear stress test 11/24/2016:  Nuclear  stress EF: 44%.  No change from baseline EKG with RBBB. There were frequent PVCs including ventricular couplets and bigeminal PVCs.  There is a defect present in the basal inferior, mid inferior and apical inferior location. This defect is fixed and likely related to diaphragmatic attenuation artifact but cannot rule out infarct. No ischemia noted.  This is an intermediate risk study due to LV dysfunction.  The left ventricular ejection fraction is moderately decreased (30-44%).   Laboratory Data:  Chemistry Recent Labs  Lab 04/13/18 0123  NA 141  K 4.3  CL 106  CO2 25  GLUCOSE 108*  BUN 26*  CREATININE 1.05  CALCIUM 8.5*  GFRNONAA >60  GFRAA >60  ANIONGAP 10    Total Protein  Date Value Ref Range Status  04/13/2018 5.9 (L) 6.5 - 8.1 g/dL Final  08/10/2017 7.1 6.0 - 8.5 g/dL Final   Albumin  Date Value Ref Range Status  04/13/2018  3.1 (L) 3.5 - 5.0 g/dL Final  08/10/2017 4.3 3.5 - 4.8 g/dL Final   AST  Date Value Ref Range Status  04/13/2018 29 15 - 41 U/L Final   ALT  Date Value Ref Range Status  04/13/2018 24 0 - 44 U/L Final   Alkaline Phosphatase  Date Value Ref Range Status  04/13/2018 54 38 - 126 U/L Final   Total Bilirubin  Date Value Ref Range Status  04/13/2018 0.8 0.3 - 1.2 mg/dL Final   Bilirubin Total  Date Value Ref Range Status  08/10/2017 0.7 0.0 - 1.2 mg/dL Final   Hematology Recent Labs  Lab 04/13/18 0123  WBC 9.7  RBC 3.80*  HGB 11.9*  HCT 37.2*  MCV 97.9  MCH 31.3  MCHC 32.0  RDW 14.4  PLT 206   Cardiac Enzymes Recent Labs  Lab 04/13/18 0542 04/13/18 1027  TROPONINI <0.03 <0.03    Recent Labs  Lab 04/12/18 2349 04/13/18 0312  TROPIPOC 0.02 0.02    BNPNo results for input(s): BNP, PROBNP in the last 168 hours.  DDimer No results for input(s): DDIMER in the last 168 hours. TSH:  Lab Results  Component Value Date   TSH 10.070 (H) 02/17/2018   Lipids: Lab Results  Component Value Date   CHOL 144 08/10/2017   HDL 57 08/10/2017   LDLCALC 70 08/10/2017   TRIG 84 08/10/2017   CHOLHDL 2.5 08/10/2017   HgbA1c:No results found for: HGBA1C  Radiology/Studies:  Dg Chest 2 View  Result Date: 04/12/2018 CLINICAL DATA:  78 year old male with chest pain and hypertension. EXAM: CHEST - 2 VIEW COMPARISON:  Chest radiograph dated 09/29/2017 FINDINGS: The lungs are clear. There is no pleural effusion or pneumothorax. Stable mild cardiomegaly. The aorta is tortuous. Median sternotomy wires noted. No acute osseous pathology. IMPRESSION: No active cardiopulmonary disease. Electronically Signed   By: Anner Crete M.D.   On: 04/12/2018 23:54   Assessment and Plan:   1.  Chest pain with prior history of CAD s/p CABG 2009: -Patient presented with an episode of chest pain which began yesterday evening after checking his BP which was noted to be markedly elevated at 628>>315  systolic. He denies associated symptoms but was concerned given that he has not had chest pain in the past. He drove himself to the ED and was given SL NTG which dissipated his symptoms. He did have recurrent chest pain after admission in which he was again given SL NTG that relieved it once again.  -Troponin, negative x2 -EKG with no acute ST-T  wave abnormalities, not suggestive of ACS -Underwent nuclear stress test 11/2016 which was noted to be intermediate risk with defect present in the basal inferior, mid inferior and apical inferior locations however this defect was noted to likely be related to diaphragmatic attenuation artifact. There was no ischemia noted LVEF was estimated at 30 to 44% -Continue ASA 81, atorvastatin, carvedilol -Echocardiogram this admission with pending results -Needs greater BP control and likely the culprit of his symptoms.  -In the meantime, will wait for echo results, if there is significant LV dysfunction, will need cath for more definitve assessment.    2.  Hypertensive urgency: -BP on presentation noted to be 217/58 on extremely labile since admission -181/53, 136/47, 165/48, 173/61, 189/47, 193/65 -Patient received 2 SL NTG for chest pain however was on no other antihypertensives -CXR without acute findings -Creatinine at baseline, 1.05 -Home medications include amlodipine 5 mg daily, carvedilol 6.25 mg twice daily and hydralazine 25 mg twice daily>> these were continued -Will increase amlodipine to 10 mg and start lisinopril 10mg , up-titrate as tolerated. If BP ok tomorrow AM, increase to 20mg   -Will restart home dose Lasix 40mg  PO daily  -Monitor BMET in AM   3.  History of frequent PVCs: -Placed on low-dose amiodarone per EP for frequent PVCs after wearing multiple Holter monitors -PVC burden reduced to 9% -On Amiodarone 200mg  daily M-F, not on Sat/Sun   4.  Hyperlipidemia: -Stable, LDL 08/10/2017, 70 -Continue statin, Zetia  5.  UTI: -Per primary  team -UA with small amount of leukocytes, positive nitrate -Started on ceftriaxone, urine sent for culture   For questions or updates, please contact [place] Please consult [email or web address] for contact info under Cardiology/STEMI.   Signed, Kathyrn Drown NP-C [facility] Pager: [phone] 04/13/2018 12:11 PM   I have examined the patient and reviewed assessment and plan and discussed with patient.  Agree with above as stated.  I suspect his CP was related to high BP.  Negative troponin.  Nonischemic ECG.  Check echo.  If EF is low, he will need cath.   If EF is normal, continue medical therapy.  Starting lisinopril.  Increase in AM if BP still elevated.  Will follow.   Larae Grooms

## 2018-04-13 NOTE — Telephone Encounter (Signed)
Hi Dr. Havery Moros, this pt just cancelled his colonoscopy that was scheduled for tomorrow 2/27 with you because he is hospitalized due to high blood pressure. He has rescheduled to 4/1 at 9:00am. Thank you.

## 2018-04-13 NOTE — Progress Notes (Signed)
PROGRESS NOTE    [name]  WJX:[id number] DOB: 1940/02/20 DOA: 04/12/2018 PCP: [place], Betty G, MD  Brief Narrative:  Luis Herrera is Luis Herrera 78 y.o. 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  Patient states last night he checked his blood pressure and it was very high at 215/79.  He was in bed and started experiencing substernal, nonradiating, dull/aching, 3 out of 10 intensity chest pain.  Chest pain completely resolved after he received sublingual nitroglycerin in the ED but then came back later.  States he received another dose of nitroglycerin which also helped Lenn Volker lot.  At present reports having minimal 0.5 out of 10 intensity chest pain.  Denies having any associated dyspnea, diaphoresis, or nausea.  Denies having any dysuria, urinary frequency, or urgency.  Does state that his urine is foul-smelling.  He quit smoking 40 years ago.  Assessment & Plan:   Principal Problem:   Chest pain Active Problems:   CAD (coronary artery disease)   Hypertensive urgency   UTI (urinary tract infection)   Hypothyroidism  Chest pain, history of CAD -Point-of-care troponin x2 negative.  EKG not suggestive of ACS (q's in III, aVF appear old). -Chest pain resolved after nitro -Nuclear stress test done in October 2018 was intermediate risk. -Cardiac monitoring -Aspirin 324 mg once, then continue home aspirin 81 mg daily -Continue home Lipitor -Continue beta-blocker -Continue to trend troponin (negative) -Echocardiogram as noted below -Cards c/s given CP relieved by nitro - recommending improved BP control, follow echo  Hypertensive urgency -Blood pressure 217/58 on arrival.   - improved today, but still elevated -Chest x-ray without acute finding.  Creatinine at baseline. -Resume home medications including  amlodipine 5 mg daily, Coreg 6.25 mg twice daily, and hydralazine 25 mg twice daily. - cards adding amlodipine today, continue to follow -Continue to monitor blood pressure  UTI -Afebrile and no leukocytosis - he did note some UTI sx, though relatively vague -UA with small amount of leukocytes, positive nitrite, 11-20 WBCs, and many bacteria on microscopic examination. -Ceftriaxone -Urine culture  Chronic anemia -Hemoglobin 11.9, was 12.8 eight months ago.  No signs of active bleeding. -Continue to monitor  History of frequent PVCs -Continue home amiodarone  Hyperlipidemia -Continue statin, Zetia  Hypothyroidism -Continue home Synthroid   DVT prophylaxis: lovenox Code Status: full Family Communication: none at bedside Disposition Plan: pending cards eval, possible d/cc home in AM   Consultants:   cardiology  Procedures:  Echo IMPRESSIONS    1. The left ventricle has normal systolic function with an ejection fraction of 60-65%. The cavity size was normal. Left ventricular diastolic Doppler parameters are consistent with pseudonormalization Elevated left ventricular end-diastolic pressure No  evidence of left ventricular regional wall motion abnormalities.  2. No evidence of left ventricular regional wall motion abnormalities.  3. The right ventricle has normal systolic function. The cavity was mildly enlarged. There is no increase in right ventricular wall thickness. Right ventricular systolic pressure could not be assessed.  4. Bioprosthetic AVR. Aortic valve regurgitation is mild to moderate by color flow Doppler. AV Vmax: 256.50 cm/s, AV Mean Grad: 14.8 mmHg, LVOT/AV VTI ratio: 0.39.  5. Left atrial size was mild-moderately dilated.  6. There is moderate mitral annular calcification present. Mitral valve regurgitation is mild by color flow Doppler.  7. The tricuspid valve is normal in structure.  8.  The pulmonic valve was normal in structure.  9. The  inferior vena cava was normal in size with <50% respiratory variability.  Antimicrobials:  Anti-infectives (From admission, onward)   Start     Dose/Rate Route Frequency Ordered Stop   04/14/18 0400  cefTRIAXone (ROCEPHIN) 1 g in sodium chloride 0.9 % 100 mL IVPB     1 g 200 mL/hr over 30 Minutes Intravenous Every 24 hours 04/13/18 0504     04/13/18 0415  cefTRIAXone (ROCEPHIN) 1 g in sodium chloride 0.9 % 100 mL IVPB     1 g 200 mL/hr over 30 Minutes Intravenous  Once 04/13/18 0406 04/13/18 0617      Subjective: Noted CP after his BP was eelvated. Better with nitro.   Currently resolved.  He does have some sx c/w UTI  Objective: Vitals:   04/13/18 1530 04/13/18 1600 04/13/18 1700 04/13/18 1752  BP: (!) 163/51   (!) 154/46  Pulse: 60 (!) 59  61  Resp: 15 18 (!) 22 17  Temp:    (!) 97.4 F (36.3 C)  TempSrc:    Oral  SpO2: 98% 97%  96%  Weight:      Height:        Intake/Output Summary (Last 24 hours) at 04/13/2018 1821 Last data filed at 04/13/2018 1600 Gross per 24 hour  Intake 300 ml  Output -  Net 300 ml   Filed Weights   04/13/18 0652  Weight: 104.5 kg    Examination:  General exam: Appears calm and comfortable  Respiratory system: Clear to auscultation. Respiratory effort normal. Cardiovascular system: S1 & S2 heard, RRR. Gastrointestinal system: Abdomen is nondistended, soft and nontender.  Central nervous system: Alert and oriented. No focal neurological deficits. Bilateral LE edema Skin: No rashes, lesions or ulcers Psychiatry: Judgement and insight appear normal. Mood & affect appropriate.     Data Reviewed: I have personally reviewed following labs and imaging studies  CBC: Recent Labs  Lab 04/13/18 0123  WBC 9.7  NEUTROABS 7.0  HGB 11.9*  HCT 37.2*  MCV 97.9  PLT 161   Basic Metabolic Panel: Recent Labs  Lab 04/13/18 0123  NA 141  K 4.3  CL 106  CO2 25  GLUCOSE 108*  BUN 26*  CREATININE 1.05  CALCIUM 8.5*   GFR: Estimated  Creatinine Clearance: 73.7 mL/min (by C-G formula based on SCr of 1.05 mg/dL). Liver Function Tests: Recent Labs  Lab 04/13/18 0123  AST 29  ALT 24  ALKPHOS 54  BILITOT 0.8  PROT 5.9*  ALBUMIN 3.1*   Recent Labs  Lab 04/13/18 0123  LIPASE 65*   No results for input(s): AMMONIA in the last 168 hours. Coagulation Profile: No results for input(s): INR, PROTIME in the last 168 hours. Cardiac Enzymes: Recent Labs  Lab 04/13/18 0542 04/13/18 1027  TROPONINI <0.03 <0.03   BNP (last 3 results) Recent Labs    08/10/17 1322  PROBNP 330   HbA1C: No results for input(s): HGBA1C in the last 72 hours. CBG: No results for input(s): GLUCAP in the last 168 hours. Lipid Profile: No results for input(s): CHOL, HDL, LDLCALC, TRIG, CHOLHDL, LDLDIRECT in the last 72 hours. Thyroid Function Tests: No results for input(s): TSH, T4TOTAL, FREET4, T3FREE, THYROIDAB in the last 72 hours. Anemia Panel: No results for input(s): VITAMINB12, FOLATE, FERRITIN, TIBC, IRON, RETICCTPCT in the last 72 hours. Sepsis Labs: No results for input(s): PROCALCITON, LATICACIDVEN in the last 168 hours.  No results found for this or  any previous visit (from the past 240 hour(s)).       Radiology Studies: Dg Chest 2 View  Result Date: 04/12/2018 CLINICAL DATA:  78 year old male with chest pain and hypertension. EXAM: CHEST - 2 VIEW COMPARISON:  Chest radiograph dated 09/29/2017 FINDINGS: The lungs are clear. There is no pleural effusion or pneumothorax. Stable mild cardiomegaly. The aorta is tortuous. Median sternotomy wires noted. No acute osseous pathology. IMPRESSION: No active cardiopulmonary disease. Electronically Signed   By: Anner Crete M.D.   On: 04/12/2018 23:54        Scheduled Meds: . amiodarone  200 mg Oral Daily  . amLODipine  5 mg Oral Daily  . aspirin  81 mg Oral QHS  . atorvastatin  80 mg Oral QHS  . calcium-vitamin D  1 tablet Oral Daily  . carvedilol  6.25 mg Oral BID  .  enoxaparin (LOVENOX) injection  40 mg Subcutaneous Q24H  . ezetimibe  10 mg Oral Daily  . furosemide  40 mg Oral Daily  . hydrALAZINE  25 mg Oral BID  . levothyroxine  50 mcg Oral Q0600  . lisinopril  10 mg Oral Daily  . multivitamin with minerals  1 tablet Oral Daily   Continuous Infusions: . [START ON 04/14/2018] cefTRIAXone (ROCEPHIN)  IV       LOS: 0 days    Time spent: over 30 min    Fayrene Helper, MD [facility] Pager AMION  If 7PM-7AM, please contact night-coverage [email or web address] Password [facility] 04/13/2018, 6:21 PM

## 2018-04-13 NOTE — H&P (Signed)
History and Physical    Luis Herrera MGQ:[id number] DOB: [birth year] DOA: 04/12/2018  PCP: [place], Betty G, MD Patient coming from: Home  Chief Complaint: High blood pressure, chest pain  HPI: Edgel Degnan is a 78 y.o. 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  Patient states last night he checked his blood pressure and it was very high at 215/79.  He was in bed and started experiencing substernal, nonradiating, dull/aching, 3 out of 10 intensity chest pain.  Chest pain completely resolved after he received sublingual nitroglycerin in the ED but then came back later.  States he received another dose of nitroglycerin which also helped a lot.  At present reports having minimal 0.5 out of 10 intensity chest pain.  Denies having any associated dyspnea, diaphoresis, or nausea.  Denies having any dysuria, urinary frequency, or urgency.  Does state that his urine is foul-smelling.  He quit smoking 40 years ago.  Review of Systems: As per HPI otherwise 10 point review of systems negative.  Past Medical History:  Diagnosis Date  . Aortic stenosis 2008  . Arthritis   . Blood transfusion   . Blood transfusion without reported diagnosis   . CAD (coronary artery disease) 2008  . Cataract   . Complication of anesthesia    hallucinated once  . H/O hiatal hernia   . 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 Hx of colonic polyps   . Hyperlipidemia   . Hypertension   . Thyroid disease     Past Surgical History:  Procedure Laterality Date  . AORTIC VALVE REPLACEMENT  2009   Bioprosthetic at [place]  2008  . CHOLECYSTECTOMY  1982  . COLONOSCOPY    . CORONARY ARTERY BYPASS GRAFT  2009   SVG-PDA w/ AVR  . CORRECTION HAMMER TOE  2011   BILATERAL  . 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 POLYPECTOMY    . TONSILLECTOMY    . TOTAL HIP ARTHROPLASTY Left 2000  . TOTAL KNEE ARTHROPLASTY  2000   left  . TOTAL KNEE ARTHROPLASTY  12/01/2011   Procedure: TOTAL KNEE ARTHROPLASTY;  Surgeon: Mauri Pole, MD;  Location: [facility];  Service: Orthopedics;  Laterality: Right;  . WEIL OSTEOTOMY  05/28/2011     reports that he quit smoking about 44 years ago. He has never used smokeless tobacco. He reports previous alcohol use. He reports that he does not use drugs.  Allergies  Allergen Reactions  . Sulfamethoxazole-Trimethoprim Other (See Comments)    Carlyn Reichert Syndrome    Family History  Problem Relation Age of Onset  . Colon cancer Mother 53  . Heart attack Father   . Hypertension Other   . Hyperlipidemia Other   . Heart attack Brother   . Hypertension Sister   . Hypertension Brother   . Stroke Neg Hx   . Esophageal cancer Neg Hx   . Rectal cancer Neg Hx   . Stomach cancer Neg Hx     Prior to Admission medications   Medication Sig Start Date End Date Taking? Authorizing Provider  amiodarone (PACERONE) 200 MG tablet TAKE 1 TABLET BY MOUTH EVERY DAY Patient taking differently: Take 200 mg by mouth daily.  12/03/17  Yes Evans Lance, MD  amLODipine-olmesartan (AZOR) 5-40 MG tablet Take  1 tablet by mouth daily.   Yes [provider]  aspirin 81 MG tablet Take 81 mg by mouth at bedtime.    Yes [provider]  atorvastatin (LIPITOR) 80 MG tablet TAKE 1 TABLET (80 MG TOTAL) BY MOUTH DAILY. Patient taking differently: Take 80 mg by mouth at bedtime.  08/03/17  Yes Fay Records, MD  Calcium Carbonate-Vitamin D (CALCIUM-VITAMIN D) 500-200 MG-UNIT tablet Take 1 tablet by mouth daily.   Yes [provider]  carvedilol (COREG) 6.25 MG tablet TAKE 1 TABLET (6.25 MG TOTAL) BY MOUTH 2 (TWO) TIMES DAILY. 11/08/17  Yes Fay Records, MD  ezetimibe (ZETIA) 10 MG tablet Take 1 tablet (10 mg total) by  mouth daily. 11/08/17  Yes Fay Records, MD  furosemide (LASIX) 40 MG tablet TAKE 1 TABLET BY MOUTH EVERY DAY . TAKE ADDITIONAL 40MG  AS NEEDED FOR SWELLING AND OR WEIGHT Patient taking differently: Take 40 mg by mouth daily.  10/29/17  Yes Fay Records, MD  hydrALAZINE (APRESOLINE) 25 MG tablet Take 1 tablet (25 mg total) by mouth 2 (two) times daily. 08/03/17  Yes Fay Records, MD  ibuprofen (ADVIL,MOTRIN) 800 MG tablet TAKE 1 TABLET BY MOUTH EVERY 8 HOURS AS NEEDED Patient taking differently: Take 800 mg by mouth every 8 (eight) hours as needed for mild pain.  03/31/18  Yes Edrick Kins, DPM  levothyroxine (SYNTHROID) 50 MCG tablet Take 1 tablet (50 mcg total) by mouth daily before breakfast. 02/22/18  Yes Evans Lance, MD  Multiple Vitamin (MULTIVITAMIN WITH MINERALS) TABS Take 1 tablet by mouth daily.   Yes [provider]  olmesartan (BENICAR) 40 MG tablet Take 40 mg by mouth daily. 10/29/17  Yes [provider]  Spacer/Aero-Holding Chambers (AEROCHAMBER PLUS) inhaler Use as instructed to use with inahaler. 10/01/17   [place], Betty G, MD    Physical Exam: Vitals:   04/13/18 0400 04/13/18 0415 04/13/18 0430 04/13/18 0500  BP: (!) 165/48  (!) 167/47 (!) 173/61  Pulse: (!) 59 (!) 56 61 (!) 56  Resp: 16 14  17   Temp:      TempSrc:      SpO2: 97% 98% 97% 97%    Physical Exam  Constitutional: He is oriented to person, place, and time. He appears well-developed and well-nourished. No distress.  HENT:  Head: Normocephalic.  Mouth/Throat: Oropharynx is clear and moist.  Eyes: Right eye exhibits no discharge. Left eye exhibits no discharge.  Neck: Neck supple.  Cardiovascular: Normal rate, regular rhythm and intact distal pulses.  Pulmonary/Chest: Effort normal and breath sounds normal. No respiratory distress. He has no wheezes. He has no rales.  Abdominal: Soft. Bowel sounds are normal. He exhibits no distension. There is no abdominal tenderness. There is no guarding.    Musculoskeletal:        General: Edema present.     Comments: +3 pitting edema of bilateral lower extremities (chronic per patient)  Neurological: He is alert and oriented to person, place, and time.  Skin: Skin is warm and dry. He is not diaphoretic.  Psychiatric: He has a normal mood and affect. His behavior is normal.    Labs on Admission: I have personally reviewed following labs and imaging studies  CBC: Recent Labs  Lab 04/13/18 0123  WBC 9.7  NEUTROABS 7.0  HGB 11.9*  HCT 37.2*  MCV 97.9  PLT 726   Basic Metabolic Panel: Recent Labs  Lab 04/13/18 0123  NA 141  K 4.3  CL 106  CO2 25  GLUCOSE 108*  BUN 26*  CREATININE 1.05  CALCIUM 8.5*   GFR: Estimated Creatinine Clearance: 74.5 mL/min (by C-G formula based on SCr of 1.05 mg/dL). Liver Function Tests: Recent Labs  Lab 04/13/18 0123  AST 29  ALT 24  ALKPHOS 54  BILITOT 0.8  PROT 5.9*  ALBUMIN 3.1*   Recent Labs  Lab 04/13/18 0123  LIPASE 65*   No results for input(s): AMMONIA in the last 168 hours. Coagulation Profile: No results for input(s): INR, PROTIME in the last 168 hours. Cardiac Enzymes: No results for input(s): CKTOTAL, CKMB, CKMBINDEX, TROPONINI in the last 168 hours. BNP (last 3 results) Recent Labs    08/10/17 1322  PROBNP 330   HbA1C: No results for input(s): HGBA1C in the last 72 hours. CBG: No results for input(s): GLUCAP in the last 168 hours. Lipid Profile: No results for input(s): CHOL, HDL, LDLCALC, TRIG, CHOLHDL, LDLDIRECT in the last 72 hours. Thyroid Function Tests: No results for input(s): TSH, T4TOTAL, FREET4, T3FREE, THYROIDAB in the last 72 hours. Anemia Panel: No results for input(s): VITAMINB12, FOLATE, FERRITIN, TIBC, IRON, RETICCTPCT in the last 72 hours. Urine analysis:    Component Value Date/Time   COLORURINE YELLOW 04/13/2018 0209   APPEARANCEUR CLEAR 04/13/2018 0209   LABSPEC 1.021 04/13/2018 0209   PHURINE 5.0 04/13/2018 0209   GLUCOSEU NEGATIVE  04/13/2018 0209   GLUCOSEU NEGATIVE 09/01/2010 0941   HGBUR NEGATIVE 04/13/2018 0209   BILIRUBINUR NEGATIVE 04/13/2018 0209   KETONESUR NEGATIVE 04/13/2018 0209   PROTEINUR NEGATIVE 04/13/2018 0209   UROBILINOGEN 1.0 08/02/2013 1357   NITRITE POSITIVE (A) 04/13/2018 0209   LEUKOCYTESUR SMALL (A) 04/13/2018 0209    Radiological Exams on Admission: Dg Chest 2 View  Result Date: 04/12/2018 CLINICAL DATA:  78 year old male with chest pain and hypertension. EXAM: CHEST - 2 VIEW COMPARISON:  Chest radiograph dated 09/29/2017 FINDINGS: The lungs are clear. There is no pleural effusion or pneumothorax. Stable mild cardiomegaly. The aorta is tortuous. Median sternotomy wires noted. No acute osseous pathology. IMPRESSION: No active cardiopulmonary disease. Electronically Signed   By: Anner Crete M.D.   On: 04/12/2018 23:54    EKG: Independently reviewed.  Sinus rhythm, RBBB.  No significant change compared to prior tracings.  Assessment/Plan Principal Problem:   Chest pain Active Problems:   CAD (coronary artery disease)   Hypertensive urgency   UTI (urinary tract infection)   Hypothyroidism   Chest pain, history of CAD -Point-of-care troponin x2 negative.  EKG not suggestive of ACS. -Chest pain resolved after receiving a dose of sublingual nitroglycerin in the ED.  However, symptoms recurred and he received an additional dose of sublingual nitroglycerin.  At present, appears comfortable and reports having only minimal (0.5/ 10 intensity) chest pain. -Nuclear stress test done in October 2018 was intermediate risk. -Do not think hypertension is contributing to his chest pain as blood pressure significantly improved and currently normotensive. -Cardiac monitoring -Aspirin 324 mg once, then continue home aspirin 81 mg daily -Continue home Lipitor -Continue beta-blocker -Continue to trend troponin -Repeat EKG -Echocardiogram -Sublingual nitroglycerin as needed  Hypertensive  urgency -Blood pressure 217/58 on arrival.  Patient patient received sublingual nitro x2 for chest pain but no other antihypertensives.  Blood pressure significantly improved and currently normotensive. -Chest x-ray without acute finding.  Creatinine at baseline. -Resume home medications including amlodipine 5 mg daily, Coreg 6.25 mg twice daily, and hydralazine 25 mg twice daily. -Continue to monitor blood pressure  UTI -  Afebrile and no leukocytosis. -UA with small amount of leukocytes, positive nitrite, 11-20 WBCs, and many bacteria on microscopic examination. -Ceftriaxone -Urine culture  Chronic anemia -Hemoglobin 11.9, was 12.8 eight months ago.  No signs of active bleeding. -Continue to monitor  History of frequent PVCs -Continue home amiodarone  Hyperlipidemia -Continue statin, Zetia  Hypothyroidism -Continue home Synthroid  DVT prophylaxis: Lovenox Code Status: Patient wishes to be full code. Family Communication: No family available. Disposition Plan: Anticipate discharge in 1 to 2 days. Consults called: None Admission status: Observation, cardiac telemetry   Shela Leff MD [facility] Pager [phone]  If 7PM-7AM, please contact night-coverage [email or web address] Password [email or web address]  04/13/2018, 5:22 AM

## 2018-04-13 NOTE — Telephone Encounter (Signed)
Okay thanks for letting me know

## 2018-04-13 NOTE — ED Notes (Signed)
ED Provider at bedside. 

## 2018-04-13 NOTE — Progress Notes (Signed)
Report received form Scientist, clinical (histocompatibility and immunogenetics) from ED.

## 2018-04-13 NOTE — Progress Notes (Addendum)
Patient's HR in the 50's. Carvedilol due. Dr. Irish Lack paged via [facility] at this time. Per MD, ok to give medication.  Emelda Fear, RN

## 2018-04-13 NOTE — Progress Notes (Signed)
  Echocardiogram 2D Echocardiogram has been performed.  Luis Herrera 04/13/2018, 3:35 PM

## 2018-04-13 NOTE — ED Notes (Signed)
admitting Provider at bedside. 

## 2018-04-13 NOTE — Progress Notes (Addendum)
Pt received for ED via wheelchair, pt ao x4. Breathing even and unlabored in RA. Denies any chest pain at the moment. Oriented pt to room and call bell system. CCMD notified. CHG bath completed. +2 pitting edema present in bilateral legs. Call bell within reach. Will continue to monitor.

## 2018-04-14 ENCOUNTER — Telehealth: Payer: Self-pay | Admitting: Internal Medicine

## 2018-04-14 ENCOUNTER — Encounter: Payer: Medicare Other | Admitting: Gastroenterology

## 2018-04-14 DIAGNOSIS — I160 Hypertensive urgency: Secondary | ICD-10-CM | POA: Diagnosis not present

## 2018-04-14 DIAGNOSIS — R079 Chest pain, unspecified: Secondary | ICD-10-CM | POA: Diagnosis not present

## 2018-04-14 DIAGNOSIS — I25110 Atherosclerotic heart disease of native coronary artery with unstable angina pectoris: Secondary | ICD-10-CM | POA: Diagnosis not present

## 2018-04-14 DIAGNOSIS — R0789 Other chest pain: Secondary | ICD-10-CM | POA: Diagnosis not present

## 2018-04-14 LAB — CBC
HCT: 36.2 % — ABNORMAL LOW (ref 39.0–52.0)
Hemoglobin: 11.6 g/dL — ABNORMAL LOW (ref 13.0–17.0)
MCH: 31.2 pg (ref 26.0–34.0)
MCHC: 32 g/dL (ref 30.0–36.0)
MCV: 97.3 fL (ref 80.0–100.0)
Platelets: 164 10*3/uL (ref 150–400)
RBC: 3.72 MIL/uL — ABNORMAL LOW (ref 4.22–5.81)
RDW: 14.3 % (ref 11.5–15.5)
WBC: 9.1 10*3/uL (ref 4.0–10.5)
nRBC: 0 % (ref 0.0–0.2)

## 2018-04-14 LAB — BASIC METABOLIC PANEL
Anion gap: 8 (ref 5–15)
BUN: 21 mg/dL (ref 8–23)
CO2: 28 mmol/L (ref 22–32)
CREATININE: 1.16 mg/dL (ref 0.61–1.24)
Calcium: 8.7 mg/dL — ABNORMAL LOW (ref 8.9–10.3)
Chloride: 104 mmol/L (ref 98–111)
GFR calc Af Amer: >60 mL/min (ref >60)
GFR calc non Af Amer: >60 mL/min (ref >60)
GLUCOSE: 88 mg/dL (ref 70–99)
Potassium: 4.1 mmol/L (ref 3.5–5.1)
Sodium: 140 mmol/L (ref 135–145)

## 2018-04-14 LAB — MAGNESIUM: Magnesium: 2.1 mg/dL (ref 1.7–2.4)

## 2018-04-14 MED ORDER — AMLODIPINE BESYLATE 5 MG PO TABS: 5.0000 mg | ORAL_TABLET | Freq: Once | ORAL | Status: DC

## 2018-04-14 MED ORDER — AMLODIPINE BESYLATE 10 MG PO TABS: 10.0000 mg | ORAL_TABLET | Freq: Every day | ORAL | Status: DC

## 2018-04-14 MED ORDER — AMLODIPINE-OLMESARTAN 10-40 MG PO TABS: 1.0000 | ORAL_TABLET | Freq: Every day | ORAL | 0 refills | Status: DC

## 2018-04-14 MED ORDER — LISINOPRIL 10 MG PO TABS: 10.0000 mg | ORAL_TABLET | Freq: Once | ORAL | Status: DC

## 2018-04-14 MED ORDER — CEPHALEXIN 500 MG PO CAPS: 500.0000 mg | ORAL_CAPSULE | Freq: Four times a day (QID) | ORAL | 0 refills | Status: AC

## 2018-04-14 MED ORDER — IRBESARTAN 300 MG PO TABS: 300.0000 mg | ORAL_TABLET | Freq: Every day | ORAL | Status: DC

## 2018-04-14 NOTE — Telephone Encounter (Signed)
New Message        Patient is needing a 2-week f/u with Dr. Harrington Challenger but there is no availability, and he only want to see Dr. Harrington Challenger. Pls advise.

## 2018-04-14 NOTE — Progress Notes (Addendum)
Progress Note  Patient Name: Luis Herrera Date of Encounter: 04/14/2018  Primary Cardiologist: [name], MD   Subjective   Feeling better this morning. No chest pain.   Inpatient Medications    Scheduled Meds: . amiodarone  200 mg Oral Daily  . amLODipine  5 mg Oral Daily  . aspirin  81 mg Oral QHS  . atorvastatin  80 mg Oral QHS  . calcium-vitamin D  1 tablet Oral Daily  . carvedilol  6.25 mg Oral BID  . enoxaparin (LOVENOX) injection  40 mg Subcutaneous Q24H  . ezetimibe  10 mg Oral Daily  . furosemide  40 mg Oral Daily  . hydrALAZINE  25 mg Oral BID  . levothyroxine  50 mcg Oral Q0600  . lisinopril  10 mg Oral Daily  . multivitamin with minerals  1 tablet Oral Daily   Continuous Infusions: . cefTRIAXone (ROCEPHIN)  IV 1 g (04/14/18 0409)   PRN Meds: acetaminophen, nitroGLYCERIN, ondansetron (ZOFRAN) IV   Vital Signs    Vitals:   04/14/18 0841 04/14/18 0842 04/14/18 0843 04/14/18 0844  BP: (!) 151/67 (!) 151/67 (!) 151/67 (!) 151/67  Pulse:      Resp:      Temp:      TempSrc:      SpO2:      Weight:      Height:        Intake/Output Summary (Last 24 hours) at 04/14/2018 0901 Last data filed at 04/13/2018 1932 Gross per 24 hour  Intake 550 ml  Output -  Net 550 ml   Last 3 Weights 04/13/2018 04/06/2018 11/16/2017  Weight (lbs) 230 lb 6.1 oz 235 lb 12.8 oz 226 lb  Weight (kg) 104.5 kg 106.958 kg 102.513 kg      Telemetry    SR - Personally Reviewed  ECG    SR with 1st degree AVB and PVCs - Personally Reviewed  Physical Exam   GEN: No acute distress.   Neck: No JVD Cardiac: RRR, soft systolic murmur, rubs, or gallops.  Respiratory: Clear to auscultation bilaterally. GI: Soft, nontender, non-distended  MS: No edema; No deformity. Neuro:  Nonfocal  Psych: Normal affect   Labs    Chemistry Recent Labs  Lab 04/13/18 0123 04/14/18 0144  NA 141 140  K 4.3 4.1  CL 106 104  CO2 25 28  GLUCOSE 108* 88  BUN 26* 21  CREATININE 1.05 1.16    CALCIUM 8.5* 8.7*  PROT 5.9*  --   ALBUMIN 3.1*  --   AST 29  --   ALT 24  --   ALKPHOS 54  --   BILITOT 0.8  --   GFRNONAA >60 >60  GFRAA >60 >60  ANIONGAP 10 8     Hematology Recent Labs  Lab 04/13/18 0123 04/14/18 0144  WBC 9.7 9.1  RBC 3.80* 3.72*  HGB 11.9* 11.6*  HCT 37.2* 36.2*  MCV 97.9 97.3  MCH 31.3 31.2  MCHC 32.0 32.0  RDW 14.4 14.3  PLT 206 164    Cardiac Enzymes Recent Labs  Lab 04/13/18 0542 04/13/18 1027  TROPONINI <0.03 <0.03    Recent Labs  Lab 04/12/18 2349 04/13/18 0312  TROPIPOC 0.02 0.02     BNPNo results for input(s): BNP, PROBNP in the last 168 hours.   DDimer No results for input(s): DDIMER in the last 168 hours.   Radiology    Dg Chest 2 View  Result Date: 04/12/2018 CLINICAL DATA:  78 year old male with  chest pain and hypertension. EXAM: CHEST - 2 VIEW COMPARISON:  Chest radiograph dated 09/29/2017 FINDINGS: The lungs are clear. There is no pleural effusion or pneumothorax. Stable mild cardiomegaly. The aorta is tortuous. Median sternotomy wires noted. No acute osseous pathology. IMPRESSION: No active cardiopulmonary disease. Electronically Signed   By: Anner Crete M.D.   On: 04/12/2018 23:54    Cardiac Studies   TTE: 04/13/2018  IMPRESSIONS    1. The left ventricle has normal systolic function with an ejection fraction of 60-65%. The cavity size was normal. Left ventricular diastolic Doppler parameters are consistent with pseudonormalization Elevated left ventricular end-diastolic pressure No  evidence of left ventricular regional wall motion abnormalities.  2. No evidence of left ventricular regional wall motion abnormalities.  3. The right ventricle has normal systolic function. The cavity was mildly enlarged. There is no increase in right ventricular wall thickness. Right ventricular systolic pressure could not be assessed.  4. Bioprosthetic AVR. Aortic valve regurgitation is mild to moderate by color flow Doppler.  AV Vmax: 256.50 cm/s, AV Mean Grad: 14.8 mmHg, LVOT/AV VTI ratio: 0.39.  5. Left atrial size was mild-moderately dilated.  6. There is moderate mitral annular calcification present. Mitral valve regurgitation is mild by color flow Doppler.  7. The tricuspid valve is normal in structure.  8. The pulmonic valve was normal in structure.  9. The inferior vena cava was normal in size with <50% respiratory variability.  Patient Profile     78 y.o. 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  Assessment & Plan    1.  Chest pain with prior history of CAD s/p CABG 2009: No further chest pain overnight. He feels fine today. Has been up walking around the room without chest pain.  -- echo with normal EF. Given this finding and he is pain free, would not plan for further invasive testing at this time.   2.  Hypertensive urgency: BP on presentation noted to be 217/58 and extremely labile since admission -Home medications include amlodipine 5 mg daily, carvedilol 6.25 mg twice daily and hydralazine 25 mg twice daily>> these were continued -initially added lisinopril, but he is on Azor at home.  - give additional 10mg  lisinopril, along with additional 5mg  amlodipine this morning. Would increase Azor home dose to 10-40 at discharge.   3.  History of frequent PVCs: -Placed on low-dose amiodarone per EP for frequent PVCs after wearing multiple Holter monitors -PVC burden reduced to 9% -On Amiodarone 200mg  daily M-F, not on Sat/Sun   4.  Hyperlipidemia: -Stable, LDL 08/10/2017, 70 -Continue statin, Zetia  5.  UTI: -Per primary team -UA with small amount of leukocytes, positive nitrate -Started on ceftriaxone, urine sent for culture  For questions or updates, please contact [place] Please consult [email or web address] for contact info  under     Signed, Reino Bellis, NP  04/14/2018, 9:01 AM      I have examined the patient and reviewed assessment and plan and discussed with patient.  Agree with above as stated.    I agree with increased dose of amlodipine.  We went over how to properly check his BP at home.  COntinue medical therapy. Decrease salt intake.    No angina.  Ruled out for MI.  Sx initially may have been from high BP.  Echo shows normal LV  function.    Larae Grooms

## 2018-04-14 NOTE — Plan of Care (Signed)
Care plan has been  reviewed: Problem: Clinical Measurements: hypertensive crisis , transferred from ED with chest pain. Goal: Cardiovascular complication will be avoided, Respiratory complications will improve Outcome: Progressing: Pt hasn't had any chest pain tonight, HR 55-65, sinus rhythm and sinus bradycardia, BP remains stable 113-121/33-39 mmHg. No respiratory distress has been noted. Continue to monitor.  Sharron Petruska,BSN,RN,PCCN-[facility]

## 2018-04-14 NOTE — Discharge Summary (Addendum)
Physician Discharge Summary  Luis Herrera KGY:[id number] DOB: Sep 27, 1940 DOA: 04/12/2018  PCP: [place], Betty G, MD  Admit date: 04/12/2018 Discharge date: 04/14/2018  Time spent: 40 minutes  Recommendations for Outpatient Follow-up:  1. Follow up outpatient CBC/CMP 2. Increased amlodipine to 10 mg.  Follow outpatient blood pressure.  Discussed proper technique for checking BP.  Consider ambulatory BP monitoring. 3. Discharged on keflex for UTI, but no culture initially collected.  Follow after completion of sx.  Consider repeat UA/culture depending on symptoms.  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 Patient states last night he checked his blood pressure and it was very high at 215/79. He was in bed and started experiencing substernal, nonradiating, dull/aching, 3 out of 10 intensity chest pain. Chest pain completely resolved after he received sublingual nitroglycerin in the ED but then came back later. States he received another dose of nitroglycerin which also helped Luis Herrera lot. At present reports having minimal 0.5 out of 10 intensity chest pain. Denies having any associated dyspnea, diaphoresis, or nausea. Denies having any dysuria, urinary frequency, or urgency. Does state that his urine is foul-smelling. He quit smoking 40 years ago.  He was admitted for CP rule out.  He ruled out with negative troponins and had echo with normal EF and no  WMA.  He was seen by cardiology who recommended adjusting BP meds.  Asymptomatic on day of discharge.   Hospital Course:  Chest pain,history of CAD -Ruled out with negative troponins, echo reassuring  -Chest pain resolved after nitro -Nuclear stress test done in October 2018 was intermediate risk. -Cardiac monitoring -Aspirin 324 mg once, then continue home aspirin 81 mg daily -Continue home Lipitor -Continue beta-blocker -Continue to trend troponin (negative) -Echocardiogram as noted below -Cards c/s given CP relieved by nitro - recommending improved BP control, increased amlodipine to 10 mg.  Follow up with cardiology outpatient.  Hypertensive urgency -Blood pressure 217/58 on arrival.  - improved today, but still elevated -Chest x-ray without acute finding.Creatinine at baseline. - Increase amlodipine to 10 mg.  Continue coreg, lasix, hydralaxine, olmesartan. -Continue to monitor blood pressure outpatient.  Consider ambulatory BP monitoring.  UTI -Afebrile and no leukocytosis - he did note some UTI sx at presentatino, though relatively vague.  Improved after ceftriaxone. -UA with small amount of leukocytes, positive nitrite, 11-20 WBCs, and many bacteria on microscopic examination. -Urine culture, unfortunately not collected.  Will d/c on keflex.  Will need outpatient follow up with repeat UA (+/- cx depending on symptoms)  Chronic anemia -Hemoglobin 11.9, was 12.8eightmonths ago. No signs of active bleeding. -Continue to monitor  History of frequent PVCs -Continue home amiodarone  Hyperlipidemia -Continue statin, Zetia  Hypothyroidism -Continue home Synthroid   Procedures: IMPRESSIONS    1. The left ventricle has normal systolic function with an ejection fraction of 60-65%. The cavity size was normal. Left ventricular diastolic Doppler parameters are consistent with pseudonormalization Elevated left ventricular end-diastolic pressure No  evidence of  left ventricular regional wall motion abnormalities.  2. No evidence  of left ventricular regional wall motion abnormalities.  3. The right ventricle has normal systolic function. The cavity was mildly enlarged. There is no increase in right ventricular wall thickness. Right ventricular systolic pressure could not be assessed.  4. Bioprosthetic AVR. Aortic valve regurgitation is mild to moderate by color flow Doppler. AV Vmax: 256.50 cm/s, AV Mean Grad: 14.8 mmHg, LVOT/AV VTI ratio: 0.39.  5. Left atrial size was mild-moderately dilated.  6. There is moderate mitral annular calcification present. Mitral valve regurgitation is mild by color flow Doppler.  7. The tricuspid valve is normal in structure.  8. The pulmonic valve was normal in structure.   9. The inferior vena cava was normal in size with <50% respiratory variability.  Consultations:  cardiology  Discharge Exam: Vitals:   04/14/18 0843 04/14/18 0844  BP: (!) 151/67 (!) 151/67  Pulse:    Resp:    Temp:    SpO2:     Doing well. Son at bedside. Eager for d/c home.  General: No acute distress. Cardiovascular: Heart sounds show Luis Herrera regular rate, and rhythm.. Lungs: Clear to auscultation bilaterally  Abdomen: Soft, nontender, nondistended  Neurological: Alert and oriented 3. Moves all extremities 4. Cranial nerves II through XII grossly intact. Skin: Warm and dry. No rashes or lesions. Extremities: No clubbing or cyanosis. 1+ LEE.  Psychiatric: Mood and affect are normal. Insight and judgment are appropriate.  Discharge Instructions   Discharge Instructions    Call MD for:  difficulty breathing, headache or visual disturbances   Complete by:  As directed    Call MD for:  extreme fatigue   Complete by:  As directed    Call MD for:  hives   Complete by:  As directed    Call MD for:  persistant dizziness or light-headedness   Complete by:  As directed    Call MD for:  persistant nausea and vomiting   Complete by:  As  directed    Call MD for:  redness, tenderness, or signs of infection (pain, swelling, redness, odor or green/yellow discharge around incision site)   Complete by:  As directed    Call MD for:  severe uncontrolled pain   Complete by:  As directed    Call MD for:  temperature >100.4   Complete by:  As directed    Diet - low sodium heart healthy   Complete by:  As directed    Discharge instructions   Complete by:  As directed    You were seen for chest pain.  Your labs and echo were reassuring.  We've made some adjustments to your blood pressure medication.  We've increased your dose of amlodipine in your azor (amlodipine - olmesartan) to 10 mg.  I've sent in Solash Tullo new prescription for the combination pill.  I'm sending you on keflex for your UTI.  Please follow up with your PCP to follow up your UTI.  Please follow up with cardiology as scheduled.  Return for new, recurrent, or worsening issues.  Please ask your PCP to request records from this hospitalization so they know what was done and what the next steps will be.   Increase activity slowly   Complete by:  As directed      Allergies as of 04/14/2018      Reactions   Sulfamethoxazole-trimethoprim Other (See Comments)   Carlyn Reichert Syndrome      Medication List    STOP taking these medications   amLODipine-olmesartan 5-40 MG tablet Commonly known  as:  AZOR Replaced by:  amLODipine-olmesartan 10-40 MG tablet   olmesartan 40 MG tablet Commonly known as:  BENICAR     TAKE these medications   AEROCHAMBER PLUS inhaler Use as instructed to use with inahaler.   amiodarone 200 MG tablet Commonly known as:  PACERONE TAKE 1 TABLET BY MOUTH EVERY DAY What changed:    how much to take  how to take this  when to take this  additional instructions   amLODipine-olmesartan 10-40 MG tablet Commonly known as:  AZOR Take 1 tablet by mouth daily for 30 days. Replaces:  amLODipine-olmesartan 5-40 MG tablet   aspirin 81  MG tablet Take 81 mg by mouth at bedtime.   atorvastatin 80 MG tablet Commonly known as:  LIPITOR TAKE 1 TABLET (80 MG TOTAL) BY MOUTH DAILY. What changed:    how much to take  how to take this  when to take this  additional instructions   calcium-vitamin D 500-200 MG-UNIT tablet Take 1 tablet by mouth daily.   carvedilol 6.25 MG tablet Commonly known as:  COREG TAKE 1 TABLET (6.25 MG TOTAL) BY MOUTH 2 (TWO) TIMES DAILY.   cephALEXin 500 MG capsule Commonly known as:  KEFLEX Take 1 capsule (500 mg total) by mouth 4 (four) times daily for 5 days.   ezetimibe 10 MG tablet Commonly known as:  ZETIA Take 1 tablet (10 mg total) by mouth daily.   furosemide 40 MG tablet Commonly known as:  LASIX TAKE 1 TABLET BY MOUTH EVERY DAY . TAKE ADDITIONAL 40MG  AS NEEDED FOR SWELLING AND OR WEIGHT What changed:  See the new instructions.   hydrALAZINE 25 MG tablet Commonly known as:  APRESOLINE Take 1 tablet (25 mg total) by mouth 2 (two) times daily.   ibuprofen 800 MG tablet Commonly known as:  ADVIL,MOTRIN TAKE 1 TABLET BY MOUTH EVERY 8 HOURS AS NEEDED What changed:  reasons to take this   levothyroxine 50 MCG tablet Commonly known as:  SYNTHROID Take 1 tablet (50 mcg total) by mouth daily before breakfast.   multivitamin with minerals Tabs tablet Take 1 tablet by mouth daily.      Allergies  Allergen Reactions  . Sulfamethoxazole-Trimethoprim Other (See Comments)    Carlyn Reichert Syndrome   Follow-up Information    [place], Betty G, MD Follow up.   Specialty:  Family Medicine Contact information: [place] [address] [phone]        Fay Records, MD .   Specialty:  Cardiology Contact information: [place] [address] [phone]            The results of significant diagnostics from this hospitalization (including imaging, microbiology, ancillary and laboratory) are listed below for  reference.    Significant Diagnostic Studies: Dg Chest 2 View  Result Date: 04/12/2018 CLINICAL DATA:  78 year old male with chest pain and hypertension. EXAM: CHEST - 2 VIEW COMPARISON:  Chest radiograph dated 09/29/2017 FINDINGS: The lungs are clear. There is no pleural effusion or pneumothorax. Stable mild cardiomegaly. The aorta is tortuous. Median sternotomy wires noted. No acute osseous pathology. IMPRESSION: No active cardiopulmonary disease. Electronically Signed   By: Anner Crete M.D.   On: 04/12/2018 23:54   Dg Foot Complete Right  Result Date: 03/21/2018 Please see detailed radiograph report in office note.   Microbiology: No results found for this or any previous visit (from the past 240 hour(s)).   Labs: Basic Metabolic Panel: Recent Labs  Lab 04/13/18 0123 04/14/18 0144  NA 141 140  K 4.3 4.1  CL 106 104  CO2 25 28  GLUCOSE 108* 88  BUN 26* 21  CREATININE 1.05 1.16  CALCIUM 8.5* 8.7*  MG  --  2.1   Liver Function Tests: Recent Labs  Lab 04/13/18 0123  AST 29  ALT 24  ALKPHOS 54  BILITOT 0.8  PROT 5.9*  ALBUMIN 3.1*   Recent Labs  Lab 04/13/18 0123  LIPASE 65*   No results for input(s): AMMONIA in the last 168 hours. CBC: Recent Labs  Lab 04/13/18 0123 04/14/18 0144  WBC 9.7 9.1  NEUTROABS 7.0  --   HGB 11.9* 11.6*  HCT 37.2* 36.2*  MCV 97.9 97.3  PLT 206 164   Cardiac Enzymes: Recent Labs  Lab 04/13/18 0542 04/13/18 1027  TROPONINI <0.03 <0.03   BNP: BNP (last 3 results) No results for input(s): BNP in the last 8760 hours.  ProBNP (last 3 results) Recent Labs    08/10/17 1322  PROBNP 330    CBG: No results for input(s): GLUCAP in the last 168 hours.     Signed:  Fayrene Helper MD.  [facility] 04/14/2018, 7:29 PM

## 2018-04-14 NOTE — Care Management Obs Status (Signed)
[place] NOTIFICATION   Patient Details  Name: Luis Herrera MRN: [id number] Date of Birth: [birth year]   Medicare Observation Status Notification Given:  Yes    Carles Collet, RN 04/14/2018, 9:02 AM

## 2018-04-18 ENCOUNTER — Ambulatory Visit (INDEPENDENT_AMBULATORY_CARE_PROVIDER_SITE_OTHER): Payer: Medicare Other | Admitting: Podiatry

## 2018-04-18 ENCOUNTER — Encounter: Payer: Self-pay | Admitting: Podiatry

## 2018-04-18 DIAGNOSIS — L989 Disorder of the skin and subcutaneous tissue, unspecified: Secondary | ICD-10-CM | POA: Diagnosis not present

## 2018-04-18 NOTE — Telephone Encounter (Signed)
Spoke with patient and added to scheduled 04/27/18.   Reviewed medications with him. He has not picked up increased dose of AZOR, which was prescribed when he left hospital 04/14/18.  He is still taking 5/10 mg dose. Adv to pick up and begin new dose tomorrow. Taking all other BP meds as listed.

## 2018-04-19 ENCOUNTER — Ambulatory Visit (INDEPENDENT_AMBULATORY_CARE_PROVIDER_SITE_OTHER): Payer: Medicare Other | Admitting: Family Medicine

## 2018-04-19 ENCOUNTER — Encounter: Payer: Self-pay | Admitting: Family Medicine

## 2018-04-19 ENCOUNTER — Telehealth: Payer: Self-pay | Admitting: Family Medicine

## 2018-04-19 VITALS — BP 152/52 | HR 52 | Temp 98.0°F | Wt 232.0 lb

## 2018-04-19 DIAGNOSIS — R0989 Other specified symptoms and signs involving the circulatory and respiratory systems: Secondary | ICD-10-CM | POA: Diagnosis not present

## 2018-04-19 DIAGNOSIS — I10 Essential (primary) hypertension: Secondary | ICD-10-CM | POA: Diagnosis not present

## 2018-04-19 DIAGNOSIS — J989 Respiratory disorder, unspecified: Secondary | ICD-10-CM

## 2018-04-19 MED ORDER — PREDNISONE 20 MG PO TABS: 40.0000 mg | ORAL_TABLET | Freq: Every day | ORAL | 0 refills | Status: AC

## 2018-04-19 MED ORDER — BENZONATATE 100 MG PO CAPS: 100.0000 mg | ORAL_CAPSULE | Freq: Two times a day (BID) | ORAL | 0 refills | Status: DC | PRN

## 2018-04-19 MED ORDER — HYDROCODONE-HOMATROPINE 5-1.5 MG/5ML PO SYRP: 5.0000 mL | ORAL_SOLUTION | Freq: Every evening | ORAL | 0 refills | Status: DC | PRN

## 2018-04-19 NOTE — Patient Instructions (Signed)
Bronchospasm, Adult  Bronchospasm is a tightening of the airways going into the lungs. During an episode, it may be harder to breathe. You may cough, and you may make a whistling sound when you breathe (wheeze). This condition often affects people with asthma. What are the causes? This condition is caused by swelling and irritation in the airways. It can be triggered by:  An infection (common).  Seasonal allergies.  An allergic reaction.  Exercise.  Irritants. These include pollution, cigarette smoke, strong odors, aerosol sprays, and paint fumes.  Weather changes. Winds increase molds and pollens in the air. Cold air may cause swelling.  Stress and emotional upset. What are the signs or symptoms? Symptoms of this condition include:  Wheezing. If the episode was triggered by an allergy, wheezing may start right away or hours later.  Nighttime coughing.  Frequent or severe coughing with a simple cold.  Chest tightness.  Shortness of breath.  Decreased ability to exercise. How is this diagnosed? This condition is usually diagnosed with a review of your medical history and a physical exam. Tests, such as lung function tests, are sometimes done to look for other conditions. The need for a chest X-ray depends on where the wheezing occurs and whether it is the first time you have wheezed. How is this treated? This condition may be treated with:  Inhaled medicines. These open up the airways and help you breathe. They can be taken with an inhaler or a nebulizer device.  Corticosteroid medicines. These may be given for severe bronchospasm, usually when it is associated with asthma.  Avoiding triggers, such as irritants, infection, or allergies. Follow these instructions at home: Medicines  Take over-the-counter and prescription medicines only as told by your health care provider.  If you need to use an inhaler or nebulizer to take your medicine, ask your health care provider  to explain how to use it correctly. If you were given a spacer, always use it with your inhaler. Lifestyle  Reduce the number of triggers in your home. To do this: ? Change your heating and air conditioning filter at least once a month. ? Limit your use of fireplaces and wood stoves. ? Do not smoke. Do not allow smoking in your home. ? Avoid using perfumes and fragrances. ? Get rid of pests, such as roaches and mice, and their droppings. ? Remove any mold from your home. ? Keep your house clean and dust free. Use unscented cleaning products. ? Replace carpet with wood, tile, or vinyl flooring. Carpet can trap dander and dust. ? Use allergy-proof pillows, mattress covers, and box spring covers. ? Wash bed sheets and blankets every week in hot water. Dry them in a dryer. ? Use blankets that are made of polyester or cotton. ? Wash your hands often. ? Do not allow pets in your bedroom.  Avoid breathing in cold air when you exercise. General instructions  Have a plan for seeking medical care. Know when to call your health care provider and local emergency services, and where to get emergency care.  Stay up to date on your immunizations.  When you have an episode of bronchospasm, stay calm. Try to relax and breathe more slowly.  If you have asthma, make sure you have an asthma action plan.  Keep all follow-up visits as told by your health care provider. This is important. Contact a health care provider if:  You have muscle aches.  You have chest pain.  The mucus that you cough up (  If you have asthma, make sure you have an asthma action plan.  · Keep all follow-up visits as told by your health care provider. This is important.  Contact a health care provider if:  · You have muscle aches.  · You have chest pain.  · The mucus that you cough up (sputum) changes from clear or white to yellow, green, gray, or bloody.  · You have a fever.  · Your sputum gets thicker.  Get help right away if:  · Your wheezing and coughing get worse, even after you take your prescribed medicines.  · It gets even harder to breathe.  · You develop severe chest pain.  Summary  · Bronchospasm is a tightening of the airways going into the lungs.  · During an episode of  bronchospasm, you may have a harder time breathing. You may cough and make a whistling sound when you breathe (wheeze).  · Avoid exposure to triggers such as smoke, dust, mold, animal dander, and fragrances.  · When you have an episode of bronchospasm, stay calm. Try to relax and breathe more slowly.  This information is not intended to replace advice given to you by your health care provider. Make sure you discuss any questions you have with your health care provider.  Document Released: 02/05/2003 Document Revised: 01/30/2016 Document Reviewed: 01/30/2016  Elsevier Interactive Patient Education © 2019 [employer]

## 2018-04-19 NOTE — Progress Notes (Signed)
Subjective:    Patient ID: Luis Herrera, male    DOB: 20-Oct-1940, 78 y.o.   MRN: [id number]  No chief complaint on file.   HPI  Pt is a 78 yo male with pmh sig for  HTN, PVCs, RBBB, CAD, hypothyroidism, HLD, aortic valve replaced.  Patient was seen today for acute concern, typically seen by Dr. Lovie Chol.  Pt endorses coughing and wheezing which started Thursday.  Initially had soreness with swallowing, but now no ST.  Pt notes cough mainly occurs at night.  Now causing soreness in his chest and has become a dry cough.  Pt also endorses mild headache.  Patient has tried Delsym for his symptoms with no relief.  Pt denies fever, chills, nausea, vomiting, sore throat, ear pain pressure, facial pain or pressure.  Pt mentions he was recently hospitalized 2/25-2/27 for HTN, CP,  also noted to have UTI.  Pt has follow-up with cardiology next week.  Past Medical History:  Diagnosis Date  . Aortic stenosis 2008  . Arthritis   . Blood transfusion   . Blood transfusion without reported diagnosis   . CAD (coronary artery disease) 2008  . Cataract   . Complication of anesthesia    hallucinated once  . H/O hiatal hernia   . 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 Hx of colonic polyps   . Hyperlipidemia   . Hypertension   . Thyroid disease     Allergies  Allergen Reactions  . Sulfamethoxazole-Trimethoprim Other (See Comments)    Carlyn Reichert Syndrome    ROS General: Denies fever, chills, night sweats, changes in weight, changes in appetite HEENT: Denies headaches, ear pain, changes in vision, rhinorrhea, sore throat CV: Denies CP, palpitations, SOB, orthopnea Pulm: Denies SOB   +cough, wheezing GI: Denies abdominal pain, nausea, vomiting, diarrhea, constipation GU: Denies dysuria, hematuria, frequency, vaginal discharge Msk: Denies muscle cramps, joint pains Neuro: Denies weakness, numbness, tingling Skin: Denies rashes, bruising Psych: Denies depression, anxiety,  hallucinations    Objective:    Blood pressure (!) 152/52, pulse (!) 52, temperature 98 F (36.7 C), temperature source Oral, weight 232 lb (105.2 kg), SpO2 98 %.  Gen. Pleasant, well-nourished, in no distress, normal affect   HEENT: [place]/AT, face symmetric, no scleral icterus, PERRLA, nares patent without drainage, pharynx without erythema or exudate. Lungs: cough, no accessory muscle use, moving good air, wheezes in b/l bases, no rales Cardiovascular: RRR, no m/r/g, no peripheral edema Neuro:  A&Ox3, CN II-XII intact, normal gait Skin:  Warm, no lesions/ rash  Wt Readings from Last 3 Encounters:  04/19/18 232 lb (105.2 kg)  04/13/18 230 lb 6.1 oz (104.5 kg)  04/06/18 235 lb 12.8 oz (107 kg)    Lab Results  Component Value Date   WBC 9.1 04/14/2018   HGB 11.6 (L) 04/14/2018   HCT 36.2 (L) 04/14/2018   PLT 164 04/14/2018   GLUCOSE 88 04/14/2018   CHOL 144 08/10/2017   TRIG 84 08/10/2017   HDL 57 08/10/2017   LDLCALC 70 08/10/2017   ALT 24 04/13/2018   AST 29 04/13/2018   NA 140 04/14/2018   K 4.1 04/14/2018   CL 104 04/14/2018   CREATININE 1.16 04/14/2018   BUN 21 04/14/2018   CO2 28 04/14/2018   TSH 10.070 (H) 02/17/2018   PSA 0.98 09/01/2010   INR 0.97 11/25/2011    Assessment/Plan:  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  Consider pneumonia given recent  hospitalization, though no rales on exam. -given handout -given RTC or ED precautions. - Plan: benzonatate (TESSALON) 100 MG capsule, HYDROcodone-homatropine (HYCODAN) 5-1.5 MG/5ML syrup, predniSONE (DELTASONE) 20 MG tablet  Essential hypertension -elevated.  Possibly 2/2 recent illness/cold medicine use -continue current meds -will have pt f/u with pcp for further management  F/u prn with pcp  Grier Mitts, MD

## 2018-04-19 NOTE — Telephone Encounter (Signed)
Dr [place]- pt wants to transfer to [facility]  Dr Volanda Napoleon- patient wants to transfer from Dr [place]ique

## 2018-04-19 NOTE — Telephone Encounter (Signed)
Message sent to Dr. [name] for review. 

## 2018-04-19 NOTE — Telephone Encounter (Signed)
Message sent to Dr. [name] for review and approval. 

## 2018-04-19 NOTE — Telephone Encounter (Signed)
Transition Care Management Follow-up Telephone Call   Date discharged?    Admit date: 04/12/2018   Discharge date: 04/14/2018   How have you been since you were released from the hospital? "been doing okay, other than this cough that I now have. It started out with mucous deep down that I cannot get and now is dry. I am on Keflex for a UTI - my symptoms are improving with the UTI. I have not had a fever or body aches with the cough. I feel fine during the day but at night my cough worsens and I wheeze."   Do you understand why you were in the hospital? yes   Do you understand the discharge instructions? yes   Where were you discharged to? home   Items Reviewed:  Medications reviewed: yes  Allergies reviewed: yes  Dietary changes reviewed: yes  Referrals reviewed: yes, Appt with Cards next week 03/29/2018   Functional Questionnaire:   Activities of Daily Living (ADLs):   He states they are independent in the following: ambulation, bathing and hygiene, feeding, continence, grooming, toileting and dressing States they require assistance with the following: n/a   Any transportation issues/concerns?: no   Any patient concerns? yes, see opening notes above regarding cough. Pt in office today for acute visit for cough. Pt due for HFU with Dr [place] - should be scheduled within next week. Dr Volanda Napoleon and Izora Gala aware of needed appt.    Confirmed importance and date/time of follow-up visits scheduled yes  Provider Appointment booked with --- HFU TCM to be scheduled after acute visit today  Confirmed with patient if condition begins to worsen call PCP or go to the ER.  Patient was given the office number and encouraged to call back with question or concerns.  : yes

## 2018-04-20 NOTE — Progress Notes (Signed)
   Subjective: 78 year old male presenting today with a chief complaint of a painful callus lesion noted to the plantar aspect of the left heel that appeared a few months ago. Walking and bearing weight increases the pain. He has not done anything for treatment at home. Patient is here for further evaluation and treatment.   Past Medical History:  Diagnosis Date  . Aortic stenosis 2008  . Arthritis   . Blood transfusion   . Blood transfusion without reported diagnosis   . CAD (coronary artery disease) 2008  . Cataract   . Complication of anesthesia    hallucinated once  . H/O hiatal hernia   . 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 Hx of colonic polyps   . Hyperlipidemia   . Hypertension   . Thyroid disease      Objective:  Physical Exam General: Alert and oriented x3 in no acute distress  Dermatology: Hyperkeratotic lesions present on the left foot x 5. Pain on palpation with a central nucleated core noted. Skin is warm, dry and supple bilateral lower extremities. Negative for open lesions or macerations.  Vascular: Palpable pedal pulses bilaterally. No edema or erythema noted. Capillary refill within normal limits.  Neurological: Epicritic and protective threshold grossly intact bilaterally.   Musculoskeletal Exam: Pain on palpation at the keratotic lesion noted. Range of motion within normal limits bilateral. Muscle strength 5/5 in all groups bilateral.  Assessment: 1. Porokeratosis left foot x 5   Plan of Care:  1. Patient evaluated 2. Excisional debridement of keratoic lesion using a chisel blade was performed without incident. Salinocaine applied.  3. Dressed area with light dressing. 4. Patient is to return to the clinic PRN.   Edrick Kins, DPM [facility]  Dr. Edrick Kins, [address]                                        [place], [place] [address]                Office [phone]  Fax [phone]

## 2018-04-22 NOTE — Telephone Encounter (Signed)
Fine with me. Thanks, BJ 

## 2018-04-27 ENCOUNTER — Encounter: Payer: Self-pay | Admitting: Internal Medicine

## 2018-04-27 ENCOUNTER — Ambulatory Visit (INDEPENDENT_AMBULATORY_CARE_PROVIDER_SITE_OTHER): Payer: Medicare Other | Admitting: Internal Medicine

## 2018-04-27 ENCOUNTER — Other Ambulatory Visit: Payer: Self-pay

## 2018-04-27 VITALS — BP 138/56 | HR 83 | Ht 71.0 in | Wt 233.0 lb

## 2018-04-27 DIAGNOSIS — I10 Essential (primary) hypertension: Secondary | ICD-10-CM | POA: Diagnosis not present

## 2018-04-27 DIAGNOSIS — Z952 Presence of prosthetic heart valve: Secondary | ICD-10-CM | POA: Diagnosis not present

## 2018-04-27 DIAGNOSIS — E039 Hypothyroidism, unspecified: Secondary | ICD-10-CM | POA: Diagnosis not present

## 2018-04-27 DIAGNOSIS — I493 Ventricular premature depolarization: Secondary | ICD-10-CM | POA: Diagnosis not present

## 2018-04-27 DIAGNOSIS — I2510 Atherosclerotic heart disease of native coronary artery without angina pectoris: Secondary | ICD-10-CM | POA: Diagnosis not present

## 2018-04-27 DIAGNOSIS — R062 Wheezing: Secondary | ICD-10-CM | POA: Diagnosis not present

## 2018-04-27 DIAGNOSIS — R600 Localized edema: Secondary | ICD-10-CM

## 2018-04-27 NOTE — Progress Notes (Signed)
Patient presents for evaluation of SOB    HPI Pt is a 78 yo with hx of CAD (s/p CABG with SVG to PDA), AV dz (s/p AVR), HTN.   I last saw him in clinic in June 2019   He is also seen [name] for PVCs   He was placed on amiodarone  With PVC reduction (40% to 9%)     The pt was recently hospitalized in Feb to [facility] for hypertensive urgency  And CP   He r/o for MI   Echo showed normal LV function and normal valve function BP meds were adjusted (amlodipine increased)and he was sent home  The pt says that soon after change was made he developed a cough dry.   Worse when he lies down at night.   Has hard time sleeping  Denies CP   No F/C     Allergies  Allergen Reactions  . Sulfamethoxazole-Trimethoprim Other (See Comments)    Carlyn Reichert Syndrome     Current Outpatient Medications  Medication Sig Dispense Refill  . amiodarone (PACERONE) 200 MG tablet TAKE 1 TABLET BY MOUTH EVERY DAY (Patient taking differently: Take 200 mg by mouth daily. ) 90 tablet 3  . amLODipine-olmesartan (AZOR) 10-40 MG tablet Take 1 tablet by mouth daily for 30 days. 30 tablet 0  . aspirin 81 MG tablet Take 81 mg by mouth at bedtime.     Marland Kitchen atorvastatin (LIPITOR) 80 MG tablet TAKE 1 TABLET (80 MG TOTAL) BY MOUTH DAILY. (Patient taking differently: Take 80 mg by mouth at bedtime. ) 90 tablet 1  . Calcium Carbonate-Vitamin D (CALCIUM-VITAMIN D) 500-200 MG-UNIT tablet Take 1 tablet by mouth daily.    . carvedilol (COREG) 6.25 MG tablet TAKE 1 TABLET (6.25 MG TOTAL) BY MOUTH 2 (TWO) TIMES DAILY. 180 tablet 2  . ezetimibe (ZETIA) 10 MG tablet Take 1 tablet (10 mg total) by mouth daily. 90 tablet 2  . furosemide (LASIX) 40 MG tablet TAKE 1 TABLET BY MOUTH EVERY DAY . TAKE ADDITIONAL 40MG AS NEEDED FOR SWELLING AND OR WEIGHT (Patient taking differently: Take 40 mg by mouth daily. ) 180 tablet 2  . hydrALAZINE (APRESOLINE) 25 MG tablet Take 1 tablet (25 mg total) by mouth 2 (two) times daily. 180 tablet 1  .  ibuprofen (ADVIL,MOTRIN) 800 MG tablet TAKE 1 TABLET BY MOUTH EVERY 8 HOURS AS NEEDED (Patient taking differently: Take 800 mg by mouth every 8 (eight) hours as needed for mild pain. ) 90 tablet 0  . levothyroxine (SYNTHROID) 50 MCG tablet Take 1 tablet (50 mcg total) by mouth daily before breakfast. 90 tablet 3  . Multiple Vitamin (MULTIVITAMIN WITH MINERALS) TABS Take 1 tablet by mouth daily.     No current facility-administered medications for this visit.      Past Medical History:  Diagnosis Date  . Aortic stenosis 2008  . Arthritis   . Blood transfusion   . Blood transfusion without reported diagnosis   . CAD (coronary artery disease) 2008  . Cataract   . Complication of anesthesia    hallucinated once  . H/O hiatal hernia   . 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 Hx of colonic polyps   . Hyperlipidemia   . Hypertension   . Thyroid disease     ROS:   All systems reviewed and negative except as noted in the HPI.   Past Surgical History:  Procedure Laterality Date  .  AORTIC VALVE REPLACEMENT  2009   Bioprosthetic at [place]  2008  . CHOLECYSTECTOMY  1982  . COLONOSCOPY    . CORONARY ARTERY BYPASS GRAFT  2009   SVG-PDA w/ AVR  . CORRECTION HAMMER TOE  2011   BILATERAL  . 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 POLYPECTOMY    . TONSILLECTOMY    . TOTAL HIP ARTHROPLASTY Left 2000  . TOTAL KNEE ARTHROPLASTY  2000   left  . TOTAL KNEE ARTHROPLASTY  12/01/2011   Procedure: TOTAL KNEE ARTHROPLASTY;  Surgeon: Mauri Pole, MD;  Location: [facility] ORS;  Service: Orthopedics;  Laterality: Right;  . WEIL OSTEOTOMY  05/28/2011     Family History  Problem Relation Age of Onset  . Colon cancer Mother 6  . Heart attack Father   . Hypertension Other   . Hyperlipidemia Other   . Heart attack Brother   . Hypertension Sister   . Hypertension Brother   . Stroke Neg Hx   . Esophageal cancer Neg Hx   .  Rectal cancer Neg Hx   . Stomach cancer Neg Hx      Social History   Socioeconomic History  . Marital status: Married    Spouse name: Not on file  . Number of children: Not on file  . Years of education: Not on file  . Highest education level: Not on file  Occupational History  . Occupation: retired  Scientific laboratory technician  . Financial resource strain: Not on file  . Food insecurity:    Worry: Not on file    Inability: Not on file  . Transportation needs:    Medical: Not on file    Non-medical: Not on file  Tobacco Use  . Smoking status: Former Smoker    Last attempt to quit: 05/25/1973    Years since quitting: 44.9  . Smokeless tobacco: Never Used  Substance and Sexual Activity  . Alcohol use: Not Currently    Comment: rare  . Drug use: No  . Sexual activity: Not Currently  Lifestyle  . Physical activity:    Days per week: Not on file    Minutes per session: Not on file  . Stress: Not on file  Relationships  . Social connections:    Talks on phone: Not on file    Gets together: Not on file    Attends religious service: Not on file    Active member of club or organization: Not on file    Attends meetings of clubs or organizations: Not on file    Relationship status: Not on file  . Intimate partner violence:    Fear of current or ex partner: Not on file    Emotionally abused: Not on file    Physically abused: Not on file    Forced sexual activity: Not on file  Other Topics Concern  . Not on file  Social History Narrative   Regular exercise-yes. Pt lives in [place] with his wife.     BP (!) 138/56   Pulse 83   Ht 5' 11"  (1.803 m)   Wt 233 lb (105.7 kg)   SpO2 97%   BMI 32.50 kg/m   Physical Exam: PT is an obese 78 yo in NAD   HEENT: Unremarkable Neck:  Neck is full, no thyromegally Lymphatics:  No adenopathy Back:  No CVA tenderness Lungs:  Diffuse wheezes   HEART:  Regular rate rhythm, no  murmurs, no rubs, no clicks Abd:  soft, positive  bowel sounds, no organomegally, no rebound, no guarding Ext:  2 plus pulses, 1-2+ edema, no cyanosis, no clubbing Skin:  No rashes no nodules Neuro:  CN II through XII intact, motor grossly intact    Assess/Plan:  1  Cough.  Pt coughs at night when lays down    Pt with diffuse wheezes on exam   Evid of increase volume   I have asked him to take 80 lasix today   Come in for labs in AM  (CBC, ESR, BMET, BNP) as well as PA/Lateral CXR Take Pepcid emprically  2   HTN   Pt recently discharged with hypertensive urgency    I am no sure of trigger   BP is betteri with increased amlodipine in [place].   Follow  3  CAD   No symptoms to sugg angina 4.  S/p AVR   3   PVCs   On amiodarone   I do not think this is amio lung but I would check ESR 4   Hx elevated TSH   TSH in Jan was 10   Will need to recheck now that on increased syntrhoid  5   Edema   Hx of in past   Some may be venous insuff  Will give lasix and follow

## 2018-04-27 NOTE — Patient Instructions (Addendum)
Medication Instructions:  Tonight - take lasix 2 tablets (80 mg), then return to usual dose tomorrow. If you need a refill on your cardiac medications before your next appointment, please call your pharmacy.   Lab work: Tomorrow - bmet, bnp, cbc, sed rate, and get a chest xray tomorrow If you have labs (blood work) drawn today and your tests are completely normal, you will receive your results only by: Marland Kitchen MyChart Message (if you have MyChart) OR . A paper copy in the mail If you have any lab test that is abnormal or we need to change your treatment, we will call you to review the results.  Testing/Procedures: A chest x-ray takes a picture of the organs and structures inside the chest, including the heart, lungs, and blood vessels. This test can show several things, including, whether the heart is enlarges; whether fluid is building up in the lungs; and whether pacemaker / defibrillator leads are still in place.  Follow-Up: Follow up with your physician will depend on test results.  Any Other Special Instructions Will Be Listed Below (If Applicable). Go to [place]

## 2018-04-28 ENCOUNTER — Ambulatory Visit
Admission: RE | Admit: 2018-04-28 | Discharge: 2018-04-28 | Disposition: A | Discharge status: Home / Self Care | Payer: Medicare Other | Source: Ambulatory Visit | Attending: Internal Medicine | Admitting: Internal Medicine

## 2018-04-28 ENCOUNTER — Other Ambulatory Visit: Payer: Medicare Other

## 2018-04-28 ENCOUNTER — Telehealth: Payer: Self-pay | Admitting: Internal Medicine

## 2018-04-28 DIAGNOSIS — Z79899 Other long term (current) drug therapy: Secondary | ICD-10-CM

## 2018-04-28 DIAGNOSIS — R062 Wheezing: Secondary | ICD-10-CM

## 2018-04-28 DIAGNOSIS — I10 Essential (primary) hypertension: Secondary | ICD-10-CM

## 2018-04-28 DIAGNOSIS — R600 Localized edema: Secondary | ICD-10-CM | POA: Diagnosis not present

## 2018-04-28 DIAGNOSIS — R350 Frequency of micturition: Secondary | ICD-10-CM

## 2018-04-28 DIAGNOSIS — R399 Unspecified symptoms and signs involving the genitourinary system: Secondary | ICD-10-CM

## 2018-04-28 DIAGNOSIS — Z952 Presence of prosthetic heart valve: Secondary | ICD-10-CM

## 2018-04-28 NOTE — Telephone Encounter (Addendum)
Left message for patient to call back. CXR resulted and sent to MyChart by Dr. Harrington Challenger. Labs not reviewed at this time. Pt waiting on direction for lasix dose today from Dr. Harrington Challenger

## 2018-04-28 NOTE — Telephone Encounter (Signed)
Pt had a procedure yesterday, and wanted to speak with Luis Herrera to determine when he should go back on his water pill

## 2018-04-29 ENCOUNTER — Other Ambulatory Visit: Payer: Self-pay

## 2018-04-29 DIAGNOSIS — R350 Frequency of micturition: Secondary | ICD-10-CM

## 2018-04-29 DIAGNOSIS — R399 Unspecified symptoms and signs involving the genitourinary system: Secondary | ICD-10-CM

## 2018-04-29 LAB — SEDIMENTATION RATE: Sed Rate: 19 mm/hr (ref 0–30)

## 2018-04-29 LAB — CBC
HEMOGLOBIN: 12.4 g/dL — AB (ref 13.0–17.7)
Hematocrit: 37.4 % — ABNORMAL LOW (ref 37.5–51.0)
MCH: 31.4 pg (ref 26.6–33.0)
MCHC: 33.2 g/dL (ref 31.5–35.7)
MCV: 95 fL (ref 79–97)
Platelets: 199 10*3/uL (ref 150–450)
RBC: 3.95 x10E6/uL — ABNORMAL LOW (ref 4.14–5.80)
RDW: 13.1 % (ref 11.6–15.4)
WBC: 9.1 10*3/uL (ref 3.4–10.8)

## 2018-04-29 LAB — BASIC METABOLIC PANEL
BUN/Creatinine Ratio: 18 (ref 10–24)
BUN: 21 mg/dL (ref 8–27)
CO2: 26 mmol/L (ref 20–29)
Calcium: 8.5 mg/dL — ABNORMAL LOW (ref 8.6–10.2)
Chloride: 102 mmol/L (ref 96–106)
Creatinine, Ser: 1.18 mg/dL (ref 0.76–1.27)
GFR calc Af Amer: 68 mL/min/{1.73_m2} (ref >59)
GFR calc non Af Amer: 59 mL/min/{1.73_m2} — ABNORMAL LOW (ref >59)
Glucose: 101 mg/dL — ABNORMAL HIGH (ref 65–99)
Potassium: 4.2 mmol/L (ref 3.5–5.2)
Sodium: 144 mmol/L (ref 134–144)

## 2018-04-29 LAB — PRO B NATRIURETIC PEPTIDE: NT-Pro BNP: 463 pg/mL (ref 0–486)

## 2018-04-29 MED ORDER — FUROSEMIDE 40 MG PO TABS: ORAL_TABLET | ORAL | 2 refills | Status: DC

## 2018-04-29 NOTE — Telephone Encounter (Signed)
LMTCB

## 2018-04-29 NOTE — Telephone Encounter (Signed)
Spoke with pt re: his lab results.

## 2018-04-29 NOTE — Telephone Encounter (Signed)
Pt called   See lab notes

## 2018-04-29 NOTE — Telephone Encounter (Signed)
-----   Message from Fay Records, MD sent at 04/29/2018  8:47 AM EDT ----- Damaris Schooner to pt    He took lasix 80 on Thursday afternoon   Urinated a lot    Yesterday took 40  Wheezing almost gone   Ankles good   Wt down 4 lb Recomm:    Lasix 40 and every few days change to 60  Eat banana, drink OJ Get BMET Monday after next (3/23) Get UA at that time (hx of UTI)

## 2018-05-09 ENCOUNTER — Other Ambulatory Visit: Payer: Self-pay

## 2018-05-09 ENCOUNTER — Other Ambulatory Visit: Payer: Medicare Other

## 2018-05-09 NOTE — Telephone Encounter (Signed)
Okay with Dr Volanda Napoleon to transfer from [place] to [place]?

## 2018-05-10 NOTE — Telephone Encounter (Signed)
Called pt left a message for pt to call the office on scheduling a transfer of care appointment

## 2018-05-13 ENCOUNTER — Other Ambulatory Visit: Payer: Self-pay

## 2018-05-13 ENCOUNTER — Telehealth: Payer: Self-pay | Admitting: Internal Medicine

## 2018-05-13 MED ORDER — AMLODIPINE-OLMESARTAN 10-40 MG PO TABS: 1.0000 | ORAL_TABLET | Freq: Every day | ORAL | 3 refills | Status: DC

## 2018-05-13 NOTE — Telephone Encounter (Signed)
 *  STAT* If patient is at the pharmacy, call can be transferred to refill team.   1. Which medications need to be refilled? (please list name of each medication and dose if known) amLODipine-olmesartan (AZOR) 10-40 MG tablet  2. Which pharmacy/location (including street and city if local pharmacy) is medication to be sent to? [facility]  3. Do they need a 30 day or 90 day supply? 90 day

## 2018-05-16 ENCOUNTER — Other Ambulatory Visit: Payer: Self-pay | Admitting: Internal Medicine

## 2018-05-18 ENCOUNTER — Encounter: Payer: Medicare Other | Admitting: Gastroenterology

## 2018-05-18 NOTE — Telephone Encounter (Signed)
Called pt left a detailed message to call the office to schedule a TOC visit with dr Volanda Napoleon

## 2018-05-23 ENCOUNTER — Telehealth: Payer: Self-pay | Admitting: Gastroenterology

## 2018-05-23 NOTE — Telephone Encounter (Signed)
Please ignore previous message.  Pt decided to cancel.

## 2018-05-23 NOTE — Telephone Encounter (Signed)
Pt's recall colon was reschedule to April 16.  Pt requested a call to discuss new prep instructions.

## 2018-06-02 ENCOUNTER — Encounter: Payer: Medicare Other | Admitting: Gastroenterology

## 2018-06-04 ENCOUNTER — Other Ambulatory Visit: Payer: Self-pay | Admitting: Internal Medicine

## 2018-06-04 DIAGNOSIS — I10 Essential (primary) hypertension: Secondary | ICD-10-CM

## 2018-06-14 ENCOUNTER — Other Ambulatory Visit: Payer: Medicare Other

## 2018-06-14 ENCOUNTER — Other Ambulatory Visit: Payer: Self-pay

## 2018-06-14 ENCOUNTER — Telehealth: Payer: Self-pay | Admitting: Internal Medicine

## 2018-06-14 DIAGNOSIS — R350 Frequency of micturition: Secondary | ICD-10-CM | POA: Diagnosis not present

## 2018-06-14 DIAGNOSIS — Z79899 Other long term (current) drug therapy: Secondary | ICD-10-CM

## 2018-06-14 DIAGNOSIS — I10 Essential (primary) hypertension: Secondary | ICD-10-CM

## 2018-06-14 DIAGNOSIS — Z952 Presence of prosthetic heart valve: Secondary | ICD-10-CM

## 2018-06-14 DIAGNOSIS — R399 Unspecified symptoms and signs involving the genitourinary system: Secondary | ICD-10-CM | POA: Diagnosis not present

## 2018-06-14 NOTE — Telephone Encounter (Signed)
° °  Patient wants to discuss possible UTI

## 2018-06-14 NOTE — Telephone Encounter (Signed)
Per pt request and ok from the lab pt to have his labs drawn today... he previously had UA ordered from Dr. Harrington Challenger for potential UTI and lab was rescheduled due to [place] but pt still have dark foul odor urine.        Cardiac Questionnaire:    Since your last visit or hospitalization:    1. Have you been having new or worsening chest pain? NO   2. Have you been having new or worsening shortness of breath? NO 3. Have you been having new or worsening leg swelling, wt gain, or increase in abdominal girth (pants fitting more tightly)? NO   4. Have you had any passing out spells? NO    *A YES to any of these questions would result in the appointment being kept. *If all the answers to these questions are NO, we should indicate that given the current situation regarding the worldwide coronarvirus pandemic, at the recommendation of the CDC, we are looking to limit gatherings in our waiting area, and thus will reschedule their appointment beyond four weeks from today.   _____________   [id number] Pre-Screening Questions:  . Do you currently have a fever? NO (yes = cancel and refer to pcp for e-visit) . Have you recently travelled on a cruise, internationally, or to [place], [place], [place], [place], [place], or [place], [place] [employer]) ? NO (yes = cancel, stay home, monitor symptoms, and contact pcp or initiate e-visit if symptoms develop) . Have you been in contact with someone that is currently pending confirmation of Covid19 testing or has been confirmed to have the [place] virus?  NO (yes = cancel, stay home, away from tested individual, monitor symptoms, and contact pcp or initiate e-visit if symptoms develop) . Are you currently experiencing fatigue or cough? NO (yes = pt should be prepared to have a mask placed at the time of their visit).

## 2018-06-14 NOTE — Telephone Encounter (Signed)
lmtcb

## 2018-06-15 ENCOUNTER — Telehealth: Payer: Self-pay | Admitting: Internal Medicine

## 2018-06-15 LAB — BASIC METABOLIC PANEL
BUN/Creatinine Ratio: 16 (ref 10–24)
BUN: 21 mg/dL (ref 8–27)
CO2: 25 mmol/L (ref 20–29)
Calcium: 9.1 mg/dL (ref 8.6–10.2)
Chloride: 99 mmol/L (ref 96–106)
Creatinine, Ser: 1.32 mg/dL — ABNORMAL HIGH (ref 0.76–1.27)
GFR calc Af Amer: 60 mL/min/{1.73_m2} (ref >59)
GFR calc non Af Amer: 52 mL/min/{1.73_m2} — ABNORMAL LOW (ref >59)
Glucose: 81 mg/dL (ref 65–99)
Potassium: 4.4 mmol/L (ref 3.5–5.2)
Sodium: 142 mmol/L (ref 134–144)

## 2018-06-15 LAB — URINALYSIS
Bilirubin, UA: NEGATIVE
Glucose, UA: NEGATIVE
Ketones, UA: NEGATIVE
Nitrite, UA: NEGATIVE
Protein,UA: NEGATIVE
RBC, UA: NEGATIVE
Specific Gravity, UA: 1.013 (ref 1.005–1.030)
Urobilinogen, Ur: 0.2 mg/dL (ref 0.2–1.0)
pH, UA: 5 (ref 5.0–7.5)

## 2018-06-15 NOTE — Telephone Encounter (Signed)
Pt aware labs have not been reviewed at this time Will call with results once Dr Harrington Challenger reviews .Luis Herrera

## 2018-06-15 NOTE — Telephone Encounter (Signed)
Labs not reviewed at this time ./cy

## 2018-06-15 NOTE — Telephone Encounter (Signed)
New Message    Pt is calling about his lab results   Please call back

## 2018-06-16 ENCOUNTER — Telehealth: Payer: Self-pay | Admitting: Internal Medicine

## 2018-06-16 NOTE — Telephone Encounter (Signed)
New Message    Pt is calling and is asking for his lab results   Please call

## 2018-06-17 NOTE — Telephone Encounter (Signed)
  Patient wants a call back from someone today to explain to him why he is unable to get his results

## 2018-06-18 ENCOUNTER — Other Ambulatory Visit: Payer: Self-pay

## 2018-06-18 ENCOUNTER — Ambulatory Visit (HOSPITAL_COMMUNITY)
Admission: EM | Admit: 2018-06-18 | Discharge: 2018-06-18 | Disposition: A | Discharge status: Home / Self Care | Payer: Medicare Other | Attending: Emergency Medicine | Admitting: Emergency Medicine

## 2018-06-18 DIAGNOSIS — K0889 Other specified disorders of teeth and supporting structures: Secondary | ICD-10-CM | POA: Diagnosis not present

## 2018-06-18 MED ORDER — PENICILLIN V POTASSIUM 500 MG PO TABS: 500.0000 mg | ORAL_TABLET | Freq: Four times a day (QID) | ORAL | 0 refills | Status: AC

## 2018-06-18 NOTE — ED Triage Notes (Signed)
Per pt he has been having some upper lip swelling and pain. Pt did say he has a broken tooth in the front area.

## 2018-06-18 NOTE — Discharge Instructions (Signed)
Please follow up when able with a dentist for definitive treatment.  Complete course of antibiotics.  Return or go to the ER if develop fevers, or otherwise worsening.

## 2018-06-19 NOTE — ED Provider Notes (Signed)
[place]    CSN: [id number] Arrival date & time: 06/18/18  1539     History   Chief Complaint Chief Complaint  Patient presents with  . Facial Pain    HPI Luis Herrera is a 78 y.o. male.   [name] presents with swelling and pain to right upper jaw and into face. Started approximately two days ago. He hasn't been wearing his bridge recently and had eaten pretzels. He has a cracked tooth near the affected area. Pain was worse early this morning with some swelling. No fevers. No known drainage. Was able to fit his dentures in fine since symptom onset. Hx of CAD, heart murmur/ valve replacement, htn, arthritis, RBBB.     ROS per HPI, negative if not otherwise mentioned.      Past Medical History:  Diagnosis Date  . Aortic stenosis 2008  . Arthritis   . Blood transfusion   . Blood transfusion without reported diagnosis   . CAD (coronary artery disease) 2008  . Cataract   . Complication of anesthesia    hallucinated once  . H/O hiatal hernia   . 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 Hx of colonic polyps   . Hyperlipidemia   . Hypertension   . Thyroid disease     Patient Active Problem List   Diagnosis Date Noted  . Hypertensive urgency 04/13/2018  . Chest pain 04/13/2018  . UTI (urinary tract infection) 04/13/2018  . Hypothyroidism 04/13/2018  . Aortic valve stenosis 09/27/2017  . Right bundle branch block 09/27/2017  . CAD (coronary artery disease) 09/27/2017  . 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 Acquired claw toe of right foot 06/04/2016  . Class 1 obesity with body mass index (BMI) of 31.0 to 31.9 in adult 12/02/2011  . 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 Back pain of thoracolumbar region 09/01/2010  . Hyperlipidemia with target LDL less than 130 04/10/2010  . Essential hypertension 04/10/2010  . 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 AORTIC VALVE REPLACEMENT   2009   Bioprosthetic at [place]  2008  . CHOLECYSTECTOMY  1982  . COLONOSCOPY    . CORONARY ARTERY BYPASS GRAFT  2009   SVG-PDA w/ AVR  . CORRECTION HAMMER TOE  2011   BILATERAL  . 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 POLYPECTOMY    . TONSILLECTOMY    . TOTAL HIP ARTHROPLASTY Left 2000  . TOTAL KNEE ARTHROPLASTY  2000   left  . TOTAL KNEE ARTHROPLASTY  12/01/2011   Procedure: TOTAL KNEE ARTHROPLASTY;  Surgeon: Mauri Pole, MD;  Location: [facility];  Service: Orthopedics;  Laterality: Right;  . WEIL OSTEOTOMY  05/28/2011       Home Medications    Prior to Admission medications   Medication Sig Start Date End Date Taking? Authorizing Provider  amiodarone (PACERONE) 200 MG tablet TAKE 1 TABLET BY MOUTH EVERY DAY Patient taking differently: Take 200 mg by mouth daily.  12/03/17   Evans Lance, MD  amLODipine-olmesartan (AZOR) 10-40 MG tablet Take 1 tablet by mouth daily for 30 days. 05/13/18 06/12/18  Fay Records, MD  aspirin 81 MG tablet Take 81 mg by mouth at bedtime.     [provider]  atorvastatin (LIPITOR) 80 MG tablet TAKE 1 TABLET (80 MG TOTAL) BY MOUTH DAILY. 05/16/18   Fay Records, MD  Calcium Carbonate-Vitamin D (CALCIUM-VITAMIN D) 500-200 MG-UNIT tablet Take 1 tablet by mouth daily.    [provider]  carvedilol (COREG) 6.25 MG tablet TAKE 1 TABLET (6.25 MG TOTAL) BY MOUTH 2 (TWO) TIMES DAILY. 11/08/17   Fay Records, MD  ezetimibe (ZETIA) 10 MG tablet Take 1 tablet (10 mg total) by mouth daily. 11/08/17   Fay Records, MD  furosemide (LASIX) 40 MG tablet Take one tablet (40mg ) a day everyday... except 1 1/2 tablets (60mg ) on Tuesday and Saturday. 04/29/18   Fay Records, MD  hydrALAZINE (APRESOLINE) 25 MG tablet TAKE 1 TABLET BY MOUTH TWICE A DAY 06/06/18   Fay Records, MD  ibuprofen (ADVIL,MOTRIN) 800 MG tablet TAKE 1 TABLET BY MOUTH EVERY 8 HOURS AS NEEDED Patient taking differently:  Take 800 mg by mouth every 8 (eight) hours as needed for mild pain.  03/31/18   Edrick Kins, DPM  levothyroxine (SYNTHROID) 50 MCG tablet Take 1 tablet (50 mcg total) by mouth daily before breakfast. 02/22/18   Evans Lance, MD  Multiple Vitamin (MULTIVITAMIN WITH MINERALS) TABS Take 1 tablet by mouth daily.    [provider]  penicillin v potassium (VEETID) 500 MG tablet Take 1 tablet (500 mg total) by mouth 4 (four) times daily for 7 days. 06/18/18 06/25/18  Zigmund Gottron, NP    Family History Family History  Problem Relation Age of Onset  . Colon cancer Mother 48  . Heart attack Father   . Hypertension Other   . Hyperlipidemia Other   . Heart attack Brother   . Hypertension Sister   . Hypertension Brother   . Stroke Neg Hx   . Esophageal cancer Neg Hx   . Rectal cancer Neg Hx   . Stomach cancer Neg Hx     Social History Social History   Tobacco Use  . Smoking status: Former Smoker    Last attempt to quit: 05/25/1973    Years since quitting: 45.0  . Smokeless tobacco: Never Used  Substance Use Topics  . Alcohol use: Not Currently    Comment: rare  . Drug use: No     Allergies   Sulfamethoxazole-trimethoprim   Review of Systems Review of Systems   Physical Exam Triage Vital Signs ED Triage Vitals  Enc Vitals Group     BP 06/18/18 1633 (!) 123/46     Pulse Rate 06/18/18 1633 66     Resp 06/18/18 1633 16     Temp 06/18/18 1633 98 F (36.7 C)     Temp Source 06/18/18 1633 Oral     SpO2 06/18/18 1633 95 %     Weight --      Height --      Head Circumference --      Peak Flow --      Pain Score 06/18/18 1631 5     Pain Loc --      Pain Edu? --      Excl. in [place]? --    No data found.  Updated Vital Signs BP (!) 123/46 (BP Location: Right Arm)   Pulse 66   Temp 98 F (36.7 C) (Oral)   Resp 16   SpO2 95%    Physical Exam Constitutional:      Appearance: He is well-developed.  HENT:     Head:     Jaw: No trismus.     Mouth/Throat:      Dentition: Abnormal dentition. Dental caries present.  Comments: Broken and decayed tooth #7; previously pulled tooth #3-5; tenderness to gumline superior to tooth #7, no visible abscess or drainage Cardiovascular:     Rate and Rhythm: Normal rate and regular rhythm.     Heart sounds: Murmur present.  Pulmonary:     Effort: Pulmonary effort is normal.     Breath sounds: Normal breath sounds.  Skin:    General: Skin is warm and dry.  Neurological:     Mental Status: He is alert and oriented to person, place, and time.      UC Treatments / Results  Labs (all labs ordered are listed, but only abnormal results are displayed) Labs Reviewed - No data to display  EKG None  Radiology No results found.  Procedures Procedures (including critical care time)  Medications Ordered in UC Medications - No data to display  Initial Impression / Assessment and Plan / UC Course  I have reviewed the triage vital signs and the nursing notes.  Pertinent labs & imaging results that were available during my care of the patient were reviewed by me and considered in my medical decision making (see chart for details).     Concern for dental abscess with antibiotics provided. Encouraged follow up with dentist. Return precautions provided. Patient verbalized understanding and agreeable to plan.   Final Clinical Impressions(s) / UC Diagnoses   Final diagnoses:  Pain, dental     Discharge Instructions     Please follow up when able with a dentist for definitive treatment.  Complete course of antibiotics.  Return or go to the ER if develop fevers, or otherwise worsening.    ED Prescriptions    Medication Sig Dispense Auth. Provider   penicillin v potassium (VEETID) 500 MG tablet Take 1 tablet (500 mg total) by mouth 4 (four) times daily for 7 days. 28 tablet Zigmund Gottron, NP     Controlled Substance Prescriptions [place] Controlled Substance Registry consulted? Not Applicable    Zigmund Gottron, NP 06/19/18 1019

## 2018-06-21 DIAGNOSIS — D2372 Other benign neoplasm of skin of left lower limb, including hip: Secondary | ICD-10-CM | POA: Diagnosis not present

## 2018-06-21 DIAGNOSIS — M79672 Pain in left foot: Secondary | ICD-10-CM | POA: Diagnosis not present

## 2018-06-22 ENCOUNTER — Telehealth: Payer: Self-pay | Admitting: Internal Medicine

## 2018-06-22 NOTE — Telephone Encounter (Signed)
Follow up    Pt scheduled for virtual visit on 05.12.20 with Dr. Lovena Le. Pt has some concerns about his BP. He said his bottom number has been in the 40's.      Virtual Visit Pre-Appointment Phone Call  "(Name), I am calling you today to discuss your upcoming appointment. We are currently trying to limit exposure to the virus that causes COVID-19 by seeing patients at home rather than in the office."  1. "What is the BEST phone number to call the day of the visit?" - include this in appointment notes  2. Do you have or have access to (through a family member/friend) a smartphone with video capability that we can use for your visit?" a. If yes - list this number in appt notes as cell (if different from BEST phone #) and list the appointment type as a VIDEO visit in appointment notes b. If no - list the appointment type as a PHONE visit in appointment notes  3. Confirm consent - "In the setting of the current Covid19 crisis, you are scheduled for a (phone or video) visit with your provider on (date) at (time).  Just as we do with many in-office visits, in order for you to participate in this visit, we must obtain consent.  If you'd like, I can send this to your mychart (if signed up) or email for you to review.  Otherwise, I can obtain your verbal consent now.  All virtual visits are billed to your insurance company just like a normal visit would be.  By agreeing to a virtual visit, we'd like you to understand that the technology does not allow for your provider to perform an examination, and thus may limit your provider's ability to fully assess your condition. If your provider identifies any concerns that need to be evaluated in person, we will make arrangements to do so.  Finally, though the technology is pretty good, we cannot assure that it will always work on either your or our end, and in the setting of a video visit, we may have to convert it to a phone-only visit.  In either situation, we  cannot ensure that we have a secure connection.  Are you willing to proceed?" STAFF: Did the patient verbally acknowledge consent to telehealth visit? Document YES/NO here: YES  4. Advise patient to be prepared - "Two hours prior to your appointment, go ahead and check your blood pressure, pulse, oxygen saturation, and your weight (if you have the equipment to check those) and write them all down. When your visit starts, your provider will ask you for this information. If you have an Apple Watch or Kardia device, please plan to have heart rate information ready on the day of your appointment. Please have a pen and paper handy nearby the day of the visit as well."  5. Give patient instructions for MyChart download to smartphone OR Doximity/Doxy.me as below if video visit (depending on what platform provider is using)  6. Inform patient they will receive a phone call 15 minutes prior to their appointment time (may be from unknown caller ID) so they should be prepared to answer    TELEPHONE CALL NOTE  Luis Herrera has been deemed a candidate for a follow-up tele-health visit to limit community exposure during the Covid-19 pandemic. I spoke with the patient via phone to ensure availability of phone/video source, confirm preferred email & phone number, and discuss instructions and expectations.  I reminded Luis Herrera to be prepared with  any vital sign and/or heart rhythm information that could potentially be obtained via home monitoring, at the time of his visit. I reminded Luis Herrera to expect a phone call prior to his visit.  Luis Herrera 06/22/2018 3:33 PM   INSTRUCTIONS FOR DOWNLOADING THE MYCHART APP TO SMARTPHONE  - The patient must first make sure to have activated MyChart and know their login information - If Apple, go to [employer] and type in MyChart in the search bar and download the app. If Android, ask patient to go to [employer] and type in [place] in the search bar and  download the app. The app is free but as with any other app downloads, their phone may require them to verify saved payment information or Apple/Android password.  - The patient will need to then log into the app with their MyChart username and password, and select  as their healthcare provider to link the account. When it is time for your visit, go to the MyChart app, find appointments, and click Begin Video Visit. Be sure to Select Allow for your device to access the Microphone and Camera for your visit. You will then be connected, and your provider will be with you shortly.  **If they have any issues connecting, or need assistance please contact MyChart service desk [phone])**  **If using a computer, in order to ensure the best quality for their visit they will need to use either of the following Internet Browsers: [employer], or Google Chrome**  IF USING DOXIMITY or DOXY.ME - The patient will receive a link just prior to their visit by text.     FULL LENGTH CONSENT FOR TELE-HEALTH VISIT   I hereby voluntarily request, consent and authorize [facility] and its employed or contracted physicians, physician assistants, nurse practitioners or other licensed health care professionals (the Practitioner), to provide me with telemedicine health care services (the Services") as deemed necessary by the treating Practitioner. I acknowledge and consent to receive the Services by the Practitioner via telemedicine. I understand that the telemedicine visit will involve communicating with the Practitioner through live audiovisual communication technology and the disclosure of certain medical information by electronic transmission. I acknowledge that I have been given the opportunity to request an in-person assessment or other available alternative prior to the telemedicine visit and am voluntarily participating in the telemedicine visit.  I understand that I have the right to  withhold or withdraw my consent to the use of telemedicine in the course of my care at any time, without affecting my right to future care or treatment, and that the Practitioner or I may terminate the telemedicine visit at any time. I understand that I have the right to inspect all information obtained and/or recorded in the course of the telemedicine visit and may receive copies of available information for a reasonable fee.  I understand that some of the potential risks of receiving the Services via telemedicine include:   Delay or interruption in medical evaluation due to technological equipment failure or disruption;  Information transmitted may not be sufficient (e.g. poor resolution of images) to allow for appropriate medical decision making by the Practitioner; and/or   In rare instances, security protocols could fail, causing a breach of personal health information.  Furthermore, I acknowledge that it is my responsibility to provide information about my medical history, conditions and care that is complete and accurate to the best of my ability. I acknowledge that Practitioner's advice, recommendations, and/or decision  may be based on factors not within their control, such as incomplete or inaccurate data provided by me or distortions of diagnostic images or specimens that may result from electronic transmissions. I understand that the practice of medicine is not an exact science and that Practitioner makes no warranties or guarantees regarding treatment outcomes. I acknowledge that I will receive a copy of this consent concurrently upon execution via email to the email address I last provided but may also request a printed copy by calling the office of [facility].    I understand that my insurance will be billed for this visit.   I have read or had this consent read to me.  I understand the contents of this consent, which adequately explains the benefits and risks of the Services being  provided via telemedicine.   I have been provided ample opportunity to ask questions regarding this consent and the Services and have had my questions answered to my satisfaction.  I give my informed consent for the services to be provided through the use of telemedicine in my medical care  By participating in this telemedicine visit I agree to the above.

## 2018-06-22 NOTE — Telephone Encounter (Signed)
New message   Called pt to offer virtual visit with Dr. Lovena Le. No answer. Will call back

## 2018-06-28 ENCOUNTER — Other Ambulatory Visit: Payer: Self-pay

## 2018-06-28 ENCOUNTER — Telehealth (INDEPENDENT_AMBULATORY_CARE_PROVIDER_SITE_OTHER): Payer: Medicare Other | Admitting: Internal Medicine

## 2018-06-28 DIAGNOSIS — I493 Ventricular premature depolarization: Secondary | ICD-10-CM

## 2018-06-28 DIAGNOSIS — I10 Essential (primary) hypertension: Secondary | ICD-10-CM

## 2018-06-28 NOTE — Telephone Encounter (Signed)
Dr. Harrington Challenger has spoken/is speaking with patient re: results. Will route to Dr. Harrington Challenger.

## 2018-06-28 NOTE — Progress Notes (Signed)
Electrophysiology TeleHealth Note   Due to national recommendations of social distancing due to COVID 19, an audio/video telehealth visit is felt to be most appropriate for this patient at this time.  See MyChart message from today for the patient's consent to telehealth for [facility].   Date:  06/28/2018   ID:  Luis Herrera, DOB 11-Nov-1940, MRN [id number]  Location: patient's home  Provider location: [address] [place]  Evaluation Performed: Follow-up visit  PCP:  [name], MD  Cardiologist:  [name], MD  Electrophysiologist:  Dr Lovena Le  Chief Complaint:  "I been doing ok."  History of Present Illness:    Luis Herrera is a 78 y.o. male who presents via audio/video conferencing for a telehealth visit today.  Since last being seen in our clinic, the patient reports doing very well.  Today, he denies symptoms of palpitations, chest pain, shortness of breath,  lower extremity edema, dizziness, presyncope, or syncope.  The patient is otherwise without complaint today.  The patient denies symptoms of fevers, chills, cough, or new SOB worrisome for COVID 19.  Past Medical History:  Diagnosis Date  . Aortic stenosis 2008  . Arthritis   . Blood transfusion   . Blood transfusion without reported diagnosis   . CAD (coronary artery disease) 2008  . Cataract   . Complication of anesthesia    hallucinated once  . H/O hiatal hernia   . 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 Hx of colonic polyps   . Hyperlipidemia   . Hypertension   . Thyroid disease     Past Surgical History:  Procedure Laterality Date  . AORTIC VALVE REPLACEMENT  2009   Bioprosthetic at [facility]  2008  . CHOLECYSTECTOMY  1982  . COLONOSCOPY    . CORONARY ARTERY BYPASS GRAFT  2009   SVG-PDA w/ AVR  . CORRECTION HAMMER TOE  2011   BILATERAL  . 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 POLYPECTOMY    .  TONSILLECTOMY    . TOTAL HIP ARTHROPLASTY Left 2000  . TOTAL KNEE ARTHROPLASTY  2000   left  . TOTAL KNEE ARTHROPLASTY  12/01/2011   Procedure: TOTAL KNEE ARTHROPLASTY;  Surgeon: Mauri Pole, MD;  Location: [facility];  Service: Orthopedics;  Laterality: Right;  . WEIL OSTEOTOMY  05/28/2011    Current Outpatient Medications  Medication Sig Dispense Refill  . amiodarone (PACERONE) 200 MG tablet TAKE 1 TABLET BY MOUTH EVERY DAY (Patient taking differently: Take 200 mg by mouth daily. ) 90 tablet 3  . amLODipine-olmesartan (AZOR) 10-40 MG tablet Take 1 tablet by mouth daily for 30 days. 90 tablet 3  . aspirin 81 MG tablet Take 81 mg by mouth at bedtime.     Marland Kitchen atorvastatin (LIPITOR) 80 MG tablet TAKE 1 TABLET (80 MG TOTAL) BY MOUTH DAILY. 90 tablet 1  . Calcium Carbonate-Vitamin D (CALCIUM-VITAMIN D) 500-200 MG-UNIT tablet Take 1 tablet by mouth daily.    . carvedilol (COREG) 6.25 MG tablet TAKE 1 TABLET (6.25 MG TOTAL) BY MOUTH 2 (TWO) TIMES DAILY. 180 tablet 2  . ezetimibe (ZETIA) 10 MG tablet Take 1 tablet (10 mg total) by mouth daily. 90 tablet 2  . furosemide (LASIX) 40 MG tablet Take one tablet (40mg ) a day everyday... except 1 1/2 tablets (60mg ) on Tuesday and Saturday. 180 tablet 2  . hydrALAZINE (APRESOLINE) 25 MG tablet  TAKE 1 TABLET BY MOUTH TWICE A DAY 180 tablet 2  . ibuprofen (ADVIL,MOTRIN) 800 MG tablet TAKE 1 TABLET BY MOUTH EVERY 8 HOURS AS NEEDED (Patient taking differently: Take 800 mg by mouth every 8 (eight) hours as needed for mild pain. ) 90 tablet 0  . levothyroxine (SYNTHROID) 50 MCG tablet Take 1 tablet (50 mcg total) by mouth daily before breakfast. 90 tablet 3  . Multiple Vitamin (MULTIVITAMIN WITH MINERALS) TABS Take 1 tablet by mouth daily.     No current facility-administered medications for this visit.     Allergies:   Sulfamethoxazole-trimethoprim   Social History:  The patient  reports that he quit smoking about 45 years ago. He has never used smokeless  tobacco. He reports previous alcohol use. He reports that he does not use drugs.   Family History:  The patient's  family history includes Colon cancer (age of onset: 75) in his mother; Heart attack in his brother and father; Hyperlipidemia in an other family member; Hypertension in his brother, sister, and another family member.   ROS:  Please see the history of present illness.   All other systems are personally reviewed and negative.    Exam:    Vital Signs:  BP - 127/67, P - 65,  Well appearing, alert and conversant, regular work of breathing,  good skin color Eyes- anicteric, neuro- grossly intact, skin- no apparent rash or lesions or cyanosis, mouth- oral mucosa is pink   Labs/Other Tests and Data Reviewed:    Recent Labs: 02/17/2018: TSH 10.070 04/13/2018: ALT 24 04/14/2018: Magnesium 2.1 04/28/2018: Hemoglobin 12.4; NT-Pro BNP 463; Platelets 199 06/14/2018: BUN 21; Creatinine, Ser 1.32; Potassium 4.4; Sodium 142   Wt Readings from Last 3 Encounters:  04/27/18 233 lb (105.7 kg)  04/19/18 232 lb (105.2 kg)  04/13/18 230 lb 6.1 oz (104.5 kg)     Other studies personally reviewed: Additional studies/ records that were reviewed today include: none   ASSESSMENT & PLAN:    1.  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 His med list did not reflect but he is taking amiodarone 200 mg daily, Monday through Friday. None on Saturday or Sunday. He will continue. 2. Hypothyroid - likely due to amiodarone. He will continue synthroid. 3. HTN - he was in the hospital with this. He has had his meds adjusted by Dr. Harrington Challenger and BP has been much improved. 4. COVID 19 screen The patient denies symptoms of COVID 19 at this time.  The importance of social distancing was discussed today.  Follow-up:  6 months Next remote: n/a  Current medicines are reviewed at length with the patient today.   The patient does not have concerns regarding his medicines.  The following changes were made today:   none  Labs/ tests ordered today include: none No orders of the defined types were placed in this encounter.    Patient Risk:  after full review of this patients clinical status, I feel that they are at moderate risk at this time.  Today, I have spent 15 minutes with the patient with telehealth technology discussing all of the above.    Signed, Cristopher Peru, MD  06/28/2018 2:15 PM     [place] [address] [phone] (office) [phone] (fax)

## 2018-06-29 ENCOUNTER — Telehealth: Payer: Self-pay | Admitting: *Deleted

## 2018-06-29 MED ORDER — AMIODARONE HCL 200 MG PO TABS: ORAL_TABLET | ORAL | 3 refills | Status: DC

## 2018-06-29 NOTE — Telephone Encounter (Signed)
Pt returned our call.  Rescheduled procedure for 6/17 @ 930.  Pt needs 2 day prep.  Will resend prep instructions via mail.

## 2018-06-29 NOTE — Telephone Encounter (Signed)
LM on VM for pt to call back to reschedule postponed procedure.

## 2018-07-04 NOTE — Progress Notes (Signed)
This encounter was created in error - please disregard.

## 2018-07-08 DIAGNOSIS — S61412A Laceration without foreign body of left hand, initial encounter: Secondary | ICD-10-CM | POA: Diagnosis not present

## 2018-07-12 ENCOUNTER — Other Ambulatory Visit: Payer: Medicare Other

## 2018-07-18 DIAGNOSIS — M79672 Pain in left foot: Secondary | ICD-10-CM | POA: Diagnosis not present

## 2018-07-18 DIAGNOSIS — D2372 Other benign neoplasm of skin of left lower limb, including hip: Secondary | ICD-10-CM | POA: Diagnosis not present

## 2018-08-01 ENCOUNTER — Telehealth: Payer: Self-pay | Admitting: Gastroenterology

## 2018-08-01 NOTE — Telephone Encounter (Signed)
Patient will take Dulcolax tablets not liquids per pt. He has pills at home. Answered all questions at this time.

## 2018-08-01 NOTE — Telephone Encounter (Signed)
Patient called and wanted to speak with the nurse. He stated per his instructions for pre it states to take 4 dulcolax tabs however he has liquid how much of the liquid do he take?

## 2018-08-02 ENCOUNTER — Telehealth: Payer: Self-pay | Admitting: Gastroenterology

## 2018-08-02 NOTE — Telephone Encounter (Signed)

## 2018-08-03 ENCOUNTER — Other Ambulatory Visit: Payer: Self-pay

## 2018-08-03 ENCOUNTER — Ambulatory Visit (AMBULATORY_SURGERY_CENTER): Payer: Medicare Other | Admitting: Gastroenterology

## 2018-08-03 ENCOUNTER — Encounter: Payer: Self-pay | Admitting: Gastroenterology

## 2018-08-03 VITALS — BP 148/56 | HR 55 | Temp 98.4°F | Resp 16 | Ht 71.0 in | Wt 233.0 lb

## 2018-08-03 DIAGNOSIS — Z86010 Personal history of colonic polyps: Secondary | ICD-10-CM

## 2018-08-03 DIAGNOSIS — D125 Benign neoplasm of sigmoid colon: Secondary | ICD-10-CM

## 2018-08-03 DIAGNOSIS — D122 Benign neoplasm of ascending colon: Secondary | ICD-10-CM

## 2018-08-03 DIAGNOSIS — D123 Benign neoplasm of transverse colon: Secondary | ICD-10-CM

## 2018-08-03 MED ORDER — SODIUM CHLORIDE 0.9 % IV SOLN: 500.0000 mL | Freq: Once | INTRAVENOUS | Status: DC

## 2018-08-03 NOTE — Progress Notes (Signed)
A/ox3, pleased with MAC, report to RN 

## 2018-08-03 NOTE — Patient Instructions (Signed)
  Handouts given : Polyps and Diverticulosis  YOU HAD AN ENDOSCOPIC PROCEDURE TODAY AT [facility]:   Refer to the procedure report that was given to you for any specific questions about what was found during the examination.  If the procedure report does not answer your questions, please call your gastroenterologist to clarify.  If you requested that your care partner not be given the details of your procedure findings, then the procedure report has been included in a sealed envelope for you to review at your convenience later.  YOU SHOULD EXPECT: Some feelings of bloating in the abdomen. Passage of more gas than usual.  Walking can help get rid of the air that was put into your GI tract during the procedure and reduce the bloating. If you had a lower endoscopy (such as a colonoscopy or flexible sigmoidoscopy) you may notice spotting of blood in your stool or on the toilet paper. If you underwent a bowel prep for your procedure, you may not have a normal bowel movement for a few days.  Please Note:  You might notice some irritation and congestion in your nose or some drainage.  This is from the oxygen used during your procedure.  There is no need for concern and it should clear up in a day or so.  SYMPTOMS TO REPORT IMMEDIATELY:   Following lower endoscopy (colonoscopy or flexible sigmoidoscopy):  Excessive amounts of blood in the stool  Significant tenderness or worsening of abdominal pains  Swelling of the abdomen that is new, acute  Fever of 100F or higher   For urgent or emergent issues, a gastroenterologist can be reached at any hour by calling [phone].   DIET:  We do recommend a small meal at first, but then you may proceed to your regular diet.  Drink plenty of fluids but you should avoid alcoholic beverages for 24 hours.  ACTIVITY:  You should plan to take it easy for the rest of today and you should NOT DRIVE or use heavy machinery until tomorrow (because of  the sedation medicines used during the test).    FOLLOW UP: Our staff will call the number listed on your records 48-72 hours following your procedure to check on you and address any questions or concerns that you may have regarding the information given to you following your procedure. If we do not reach you, we will leave a message.  We will attempt to reach you two times.  During this call, we will ask if you have developed any symptoms of COVID 19. If you develop any symptoms (ie: fever, flu-like symptoms, shortness of breath, cough etc.) before then, please call [phone].  If you test positive for Covid 19 in the 2 weeks post procedure, please call and report this information to [place].    If any biopsies were taken you will be contacted by phone or by letter within the next 1-3 weeks.  Please call us at [phone] if you have not heard about the biopsies in 3 weeks.    SIGNATURES/CONFIDENTIALITY: You and/or your care partner have signed paperwork which will be entered into your electronic medical record.  These signatures attest to the fact that that the information above on your After Visit Summary has been reviewed and is understood.  Full responsibility of the confidentiality of this discharge information lies with you and/or your care-partner.

## 2018-08-03 NOTE — Op Note (Signed)
[facility] Patient Name: Luis Herrera Procedure Date: 08/03/2018 9:33 AM MRN: [id number] Endoscopist: Remo Lipps P. Havery Moros , MD Age: 78 Referring MD:  Date of Birth: Jul 12, 1940 Gender: Male Account #: [id number] Procedure:                Colonoscopy Indications:              Surveillance: Personal history of adenomatous                            polyps on last colonoscopy 5 years ago, fair prep                            at that time, double prep for this exam Medicines:                Monitored Anesthesia Care Procedure:                Pre-Anesthesia Assessment:                           - Prior to the procedure, a History and Physical                            was performed, and patient medications and                            allergies were reviewed. The patient's tolerance of                            previous anesthesia was also reviewed. The risks                            and benefits of the procedure and the sedation                            options and risks were discussed with the patient.                            All questions were answered, and informed consent                            was obtained. Prior Anticoagulants: The patient has                            taken no previous anticoagulant or antiplatelet                            agents. ASA Grade Assessment: III - A patient with                            severe systemic disease. After reviewing the risks                            and benefits, the patient was deemed in  satisfactory condition to undergo the procedure.                           After obtaining informed consent, the colonoscope                            was passed under direct vision. Throughout the                            procedure, the patient's blood pressure, pulse, and                            oxygen saturations were monitored continuously. The                            Model PCF-[id number]  [id number]) scope was introduced                            through the anus and advanced to the the cecum,                            identified by appendiceal orifice and ileocecal                            valve. The colonoscopy was performed without                            difficulty. The patient tolerated the procedure                            well. The quality of the bowel preparation was                            adequate. The ileocecal valve, appendiceal orifice,                            and rectum were photographed. Scope In: 9:43:10 AM Scope Out: 10:12:33 AM Scope Withdrawal Time: 0 hours 27 minutes 18 seconds  Total Procedure Duration: 0 hours 29 minutes 23 seconds  Findings:                 The perianal and digital rectal examinations were                            normal.                           A 10 mm polyp was found in the ascending colon. The                            polyp was sessile. The polyp was removed with a                            cold snare. Resection and retrieval were complete.  Two sessile polyps were found in the transverse                            colon. The polyps were 3 to 4 mm in size. These                            polyps were removed with a cold snare. Resection                            and retrieval were complete.                           A 3 mm polyp was found in the recto-sigmoid colon.                            The polyp was sessile. The polyp was removed with a                            cold snare. Resection and retrieval were complete.                           Multiple small-mouthed diverticula were found in                            the entire colon.                           The exam was otherwise without abnormality. Time                            was taken to lavage the right colon and cecum to                            achieve adequate views. Complications:            No immediate  complications. Estimated blood loss:                            Minimal. Estimated Blood Loss:     Estimated blood loss was minimal. Impression:               - One 10 mm polyp in the ascending colon, removed                            with a cold snare. Resected and retrieved.                           - Two 3 to 4 mm polyps in the transverse colon,                            removed with a cold snare. Resected and retrieved.                           - One 3  mm polyp at the recto-sigmoid colon,                            removed with a cold snare. Resected and retrieved.                           - Diverticulosis in the entire examined colon.                           - The examination was otherwise normal. Recommendation:           - Patient has a contact number available for                            emergencies. The signs and symptoms of potential                            delayed complications were discussed with the                            patient. Return to normal activities tomorrow.                            Written discharge instructions were provided to the                            patient.                           - Resume previous diet.                           - Continue present medications.                           - Await pathology results. Remo Lipps P. Armbruster, MD 08/03/2018 10:19:53 AM This report has been signed electronically.

## 2018-08-05 ENCOUNTER — Telehealth: Payer: Self-pay

## 2018-08-05 NOTE — Telephone Encounter (Signed)
  Follow up Call-  Call back number 08/03/2018  Post procedure Call Back phone  # [phone]  Permission to leave phone message Yes  Some recent data might be hidden     Patient questions:  Do you have a fever, pain , or abdominal swelling? No. Pain Score  0 *  Have you tolerated food without any problems? Yes.    Have you been able to return to your normal activities? Yes.    Do you have any questions about your discharge instructions: Diet   No. Medications  No. Follow up visit  No.  Do you have questions or concerns about your Care? No.  Actions: * If pain score is 4 or above: No action needed, pain <4.  1. Have you developed a fever since your procedure? no  2.   Have you had an respiratory symptoms (SOB or cough) since your procedure? no  3.   Have you tested positive for COVID 19 since your procedure no  4.   Have you had any family members/close contacts diagnosed with the COVID 19 since your procedure?  no   If yes to any of these questions please route to Joylene John, RN and Alphonsa Gin, Therapist, sports.

## 2018-08-11 DIAGNOSIS — D2372 Other benign neoplasm of skin of left lower limb, including hip: Secondary | ICD-10-CM | POA: Diagnosis not present

## 2018-08-11 DIAGNOSIS — M79672 Pain in left foot: Secondary | ICD-10-CM | POA: Diagnosis not present

## 2018-08-16 ENCOUNTER — Other Ambulatory Visit: Payer: Self-pay | Admitting: Internal Medicine

## 2018-09-14 ENCOUNTER — Other Ambulatory Visit: Payer: Self-pay

## 2018-09-16 DIAGNOSIS — L720 Epidermal cyst: Secondary | ICD-10-CM | POA: Diagnosis not present

## 2018-09-16 DIAGNOSIS — L570 Actinic keratosis: Secondary | ICD-10-CM | POA: Diagnosis not present

## 2018-09-16 DIAGNOSIS — L218 Other seborrheic dermatitis: Secondary | ICD-10-CM | POA: Diagnosis not present

## 2018-09-16 DIAGNOSIS — L821 Other seborrheic keratosis: Secondary | ICD-10-CM | POA: Diagnosis not present

## 2018-09-16 DIAGNOSIS — D1801 Hemangioma of skin and subcutaneous tissue: Secondary | ICD-10-CM | POA: Diagnosis not present

## 2018-09-16 DIAGNOSIS — D2261 Melanocytic nevi of right upper limb, including shoulder: Secondary | ICD-10-CM | POA: Diagnosis not present

## 2018-09-16 DIAGNOSIS — C44719 Basal cell carcinoma of skin of left lower limb, including hip: Secondary | ICD-10-CM | POA: Diagnosis not present

## 2018-09-16 DIAGNOSIS — D225 Melanocytic nevi of trunk: Secondary | ICD-10-CM | POA: Diagnosis not present

## 2018-09-16 DIAGNOSIS — Z85828 Personal history of other malignant neoplasm of skin: Secondary | ICD-10-CM | POA: Diagnosis not present

## 2018-09-27 DIAGNOSIS — M79672 Pain in left foot: Secondary | ICD-10-CM | POA: Diagnosis not present

## 2018-09-27 DIAGNOSIS — D2372 Other benign neoplasm of skin of left lower limb, including hip: Secondary | ICD-10-CM | POA: Diagnosis not present

## 2018-10-10 ENCOUNTER — Other Ambulatory Visit: Payer: Self-pay | Admitting: Internal Medicine

## 2018-10-12 DIAGNOSIS — Z23 Encounter for immunization: Secondary | ICD-10-CM | POA: Diagnosis not present

## 2018-11-09 ENCOUNTER — Other Ambulatory Visit: Payer: Self-pay | Admitting: Internal Medicine

## 2018-11-17 DIAGNOSIS — D2372 Other benign neoplasm of skin of left lower limb, including hip: Secondary | ICD-10-CM | POA: Diagnosis not present

## 2018-11-17 DIAGNOSIS — M79672 Pain in left foot: Secondary | ICD-10-CM | POA: Diagnosis not present

## 2018-12-12 ENCOUNTER — Other Ambulatory Visit: Payer: Self-pay

## 2018-12-12 ENCOUNTER — Telehealth (INDEPENDENT_AMBULATORY_CARE_PROVIDER_SITE_OTHER): Payer: Medicare Other | Admitting: Internal Medicine

## 2018-12-12 VITALS — BP 155/66 | HR 70 | Ht 71.0 in | Wt 220.0 lb

## 2018-12-12 DIAGNOSIS — Z79899 Other long term (current) drug therapy: Secondary | ICD-10-CM

## 2018-12-12 DIAGNOSIS — Z952 Presence of prosthetic heart valve: Secondary | ICD-10-CM

## 2018-12-12 DIAGNOSIS — I10 Essential (primary) hypertension: Secondary | ICD-10-CM

## 2018-12-12 DIAGNOSIS — E785 Hyperlipidemia, unspecified: Secondary | ICD-10-CM

## 2018-12-12 MED ORDER — AMLODIPINE-OLMESARTAN 10-40 MG PO TABS: 1.0000 | ORAL_TABLET | Freq: Every day | ORAL | 3 refills | Status: DC

## 2018-12-12 NOTE — Patient Instructions (Signed)
Medication Instructions:  Your physician recommends that you continue on your current medications as directed. Please refer to the Current Medication list given to you today.  *If you need a refill on your cardiac medications before your next appointment, please call your pharmacy*  Lab Work: Your physician recommends that you return for lab work on Tuesday Oct. 27. You may come in anytime after 7:30 am in a fasting state for CBC, TSH, Liver panel, BMET, BNP  If you have labs (blood work) drawn today and your tests are completely normal, you will receive your results only by: Marland Kitchen MyChart Message (if you have MyChart) OR . A paper copy in the mail If you have any lab test that is abnormal or we need to change your treatment, we will call you to review the results.  Testing/Procedures: None Ordered   Follow-Up: At [facility], you and your health needs are our priority.  As part of our continuing mission to provide you with exceptional heart care, we have created designated Provider Care Teams.  These Care Teams include your primary Cardiologist (physician) and Advanced Practice Providers (APPs -  Physician Assistants and Nurse Practitioners) who all work together to provide you with the care you need, when you need it.  Your next appointment:   5 months  The format for your next appointment:   In Person  Provider:   You may see [name], MD or one of the following Advanced Practice Providers on your designated Care Team:    Richardson Dopp, PA-C  [place], [place]  Daune Perch, NP   Other Instructions Your physician recommends that you schedule a follow-up appointment with Dr. Lovena Le - his scheduler will contact you to make an appointment

## 2018-12-12 NOTE — Progress Notes (Signed)
Virtual Visit via Video Note   This visit type was conducted due to national recommendations for restrictions regarding the COVID-19 Pandemic (e.g. social distancing) in an effort to limit this patient's exposure and mitigate transmission in our community.  Due to his co-morbid illnesses, this patient is at least at moderate risk for complications without adequate follow up.  This format is felt to be most appropriate for this patient at this time.  All issues noted in this document were discussed and addressed.  A limited physical exam was performed with this format.  Please refer to the patient's chart for his consent to telehealth for [facility].   Date:  12/12/2018   ID:  Luis Herrera, DOB 1940/04/04, MRN [id number]  Patient Location: Home Provider Location: Home  PCP:  [name], MD  Cardiologist:  [name], MD  Electrophysiologist:  Lovena Le  Evaluation Performed:  Follow-Up Visit  Chief Complaint:  F/U of CAD and AV dz  History of Present Illness:    Pt is a 78 yo with hx of CAD (s/p CABG with SVG to PDA), AV dz (s/p AVR), HTN.   I last saw him in clinic in June 2019   He is also seen [name] for PVCs   He was placed on amiodarone  With PVC reduction (40% to 9%)    THe pt was hosp in Feb for hypertensive urgency   Echo shows normal LVEF   MIld to mod AI  I saw the pt in Feb   He saw [name] in May 2020 The pt says he has been doing good   Denies CP  Breathing is OK  No cough   No palpitations  The patient does not have symptoms concerning for COVID-19 infection (fever, chills, cough, or new shortness of breath).    Past Medical History:  Diagnosis Date  . Aortic stenosis 2008  . Arthritis   . Blood transfusion   . Blood transfusion without reported diagnosis   . CAD (coronary artery disease) 2008  . Cataract   . Complication of anesthesia    hallucinated once  . H/O hiatal hernia   . 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 Hx of colonic  polyps   . Hyperlipidemia   . Hypertension   . Thyroid disease    Past Surgical History:  Procedure Laterality Date  . AORTIC VALVE REPLACEMENT  2009   Bioprosthetic at [place]  2008  . CHOLECYSTECTOMY  1982  . COLONOSCOPY    . CORONARY ARTERY BYPASS GRAFT  2009   SVG-PDA w/ AVR  . CORRECTION HAMMER TOE  2011   BILATERAL  . 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 POLYPECTOMY    . TONSILLECTOMY    . TOTAL HIP ARTHROPLASTY Left 2000  . TOTAL KNEE ARTHROPLASTY  2000   left  . TOTAL KNEE ARTHROPLASTY  12/01/2011   Procedure: TOTAL KNEE ARTHROPLASTY;  Surgeon: Mauri Pole, MD;  Location: [facility];  Service: Orthopedics;  Laterality: Right;  . WEIL OSTEOTOMY  05/28/2011     Current Meds  Medication Sig  . amiodarone (PACERONE) 200 MG tablet TAKE 1 TABLET BY MOUTH EVERY DAY Monday through Friday.  Do NOT take on Saturday or Sunday  . amLODipine-olmesartan (AZOR) 10-40 MG tablet Take 1 tablet by mouth daily for 30 days. (Patient taking differently: Take 1 tablet by mouth daily. 5-40mg )  . aspirin 81  MG tablet Take 81 mg by mouth at bedtime.   Marland Kitchen atorvastatin (LIPITOR) 80 MG tablet TAKE 1 TABLET (80 MG TOTAL) BY MOUTH DAILY.  . carvedilol (COREG) 6.25 MG tablet TAKE 1 TABLET (6.25 MG TOTAL) BY MOUTH 2 (TWO) TIMES DAILY.  Marland Kitchen ezetimibe (ZETIA) 10 MG tablet TAKE 1 TABLET BY MOUTH EVERY DAY (Patient taking differently: Take 40 mg by mouth daily. )  . furosemide (LASIX) 40 MG tablet TAKE 1 TABLET BY MOUTH EVERY DAY . TAKE ADDITIONAL 40MG  AS NEEDED FOR SWELLING AND OR WEIGHT  . hydrALAZINE (APRESOLINE) 25 MG tablet TAKE 1 TABLET BY MOUTH TWICE A DAY  . ibuprofen (ADVIL,MOTRIN) 800 MG tablet TAKE 1 TABLET BY MOUTH EVERY 8 HOURS AS NEEDED (Patient taking differently: Take 800 mg by mouth every 8 (eight) hours as needed for mild pain. )  . levothyroxine (SYNTHROID) 50 MCG tablet Take 1 tablet (50 mcg total) by mouth daily before breakfast.   . Multiple Vitamin (MULTIVITAMIN WITH MINERALS) TABS Take 1 tablet by mouth daily.   Current Facility-Administered Medications for the 12/12/18 encounter (Telemedicine) with Fay Records, MD  Medication  . 0.9 %  sodium chloride infusion  . 0.9 %  sodium chloride infusion     Allergies:   Sulfamethoxazole-trimethoprim   Social History   Tobacco Use  . Smoking status: Former Smoker    Quit date: 05/25/1973    Years since quitting: 45.5  . Smokeless tobacco: Never Used  Substance Use Topics  . Alcohol use: Not Currently    Comment: rare  . Drug use: No     Family Hx: The patient's family history includes Colon cancer (age of onset: 48) in his mother; Heart attack in his brother and father; Hyperlipidemia in an other family member; Hypertension in his brother, sister, and another family member. There is no history of Stroke, Esophageal cancer, Rectal cancer, or Stomach cancer.  ROS:   Please see the history of present illness.     All other systems reviewed and are negative.   Prior CV studies:   The following studies were reviewed today:    Labs/Other Tests and Data Reviewed:    EKG:  No ECG reviewed.  Recent Labs: 02/17/2018: TSH 10.070 04/13/2018: ALT 24 04/14/2018: Magnesium 2.1 04/28/2018: Hemoglobin 12.4; NT-Pro BNP 463; Platelets 199 06/14/2018: BUN 21; Creatinine, Ser 1.32; Potassium 4.4; Sodium 142   Recent Lipid Panel Lab Results  Component Value Date/Time   CHOL 144 08/10/2017 01:22 PM   TRIG 84 08/10/2017 01:22 PM   HDL 57 08/10/2017 01:22 PM   CHOLHDL 2.5 08/10/2017 01:22 PM   CHOLHDL 3 11/14/2015 09:54 AM   LDLCALC 70 08/10/2017 01:22 PM    Wt Readings from Last 3 Encounters:  12/12/18 220 lb (99.8 kg)  08/03/18 233 lb (105.7 kg)  04/27/18 233 lb (105.7 kg)     Objective:    Vital Signs:  BP (!) 155/66 (BP Location: Left Arm, Patient Position: Sitting, Cuff Size: Normal)   Pulse 70   Ht 5\' 11"  (1.803 m)   Wt 220 lb (99.8 kg)   BMI 30.68  kg/m    VITAL SIGNS:  reviewed  ASSESSMENT & PLAN:    1.  CAD   No symptoms of angina   FOllow  2  AV dz  S/p AVR  Last echo in Feb 2020  Mild to mod AI   Will follow with periodic echoes  3  Hx PVCs  On amiodarone  PVC burden decreased with  this Rx   Pt will be set up to see [name] to discuss taper  Set up for CBC, TSH, BMET and BNP      COVID-19 Education: The signs and symptoms of COVID-19 were discussed with the patient and how to seek care for testing (follow up with PCP or arrange E-visit).  The importance of social distancing was discussed today.  Time:   Today, I have spent  minutes with the patient with telehealth technology discussing the above problems.     Medication Adjustments/Labs and Tests Ordered: Current medicines are reviewed at length with the patient today.  Concerns regarding medicines are outlined above.   Tests Ordered: No orders of the defined types were placed in this encounter.   Medication Changes: No orders of the defined types were placed in this encounter.   Follow Up: F?U in [place], [name], MD  12/12/2018 8:55 AM    [place]

## 2018-12-13 ENCOUNTER — Other Ambulatory Visit: Payer: Self-pay | Admitting: Internal Medicine

## 2018-12-13 ENCOUNTER — Other Ambulatory Visit: Payer: Self-pay

## 2018-12-13 ENCOUNTER — Other Ambulatory Visit: Payer: Medicare Other

## 2018-12-13 DIAGNOSIS — I10 Essential (primary) hypertension: Secondary | ICD-10-CM

## 2018-12-13 DIAGNOSIS — Z952 Presence of prosthetic heart valve: Secondary | ICD-10-CM | POA: Diagnosis not present

## 2018-12-13 DIAGNOSIS — E785 Hyperlipidemia, unspecified: Secondary | ICD-10-CM

## 2018-12-13 DIAGNOSIS — Z79899 Other long term (current) drug therapy: Secondary | ICD-10-CM

## 2018-12-13 LAB — CBC WITH DIFFERENTIAL/PLATELET
Basophils Absolute: 0 10*3/uL (ref 0.0–0.2)
Basos: 1 %
EOS (ABSOLUTE): 0.5 10*3/uL — ABNORMAL HIGH (ref 0.0–0.4)
Eos: 6 %
Hematocrit: 37.6 % (ref 37.5–51.0)
Hemoglobin: 12.3 g/dL — ABNORMAL LOW (ref 13.0–17.7)
Immature Grans (Abs): 0 10*3/uL (ref 0.0–0.1)
Immature Granulocytes: 0 %
Lymphocytes Absolute: 1.5 10*3/uL (ref 0.7–3.1)
Lymphs: 19 %
MCH: 30.7 pg (ref 26.6–33.0)
MCHC: 32.7 g/dL (ref 31.5–35.7)
MCV: 94 fL (ref 79–97)
Monocytes Absolute: 0.6 10*3/uL (ref 0.1–0.9)
Monocytes: 7 %
Neutrophils Absolute: 5.1 10*3/uL (ref 1.4–7.0)
Neutrophils: 67 %
Platelets: 202 10*3/uL (ref 150–450)
RBC: 4.01 x10E6/uL — ABNORMAL LOW (ref 4.14–5.80)
RDW: 12.6 % (ref 11.6–15.4)
WBC: 7.7 10*3/uL (ref 3.4–10.8)

## 2018-12-13 LAB — HEPATIC FUNCTION PANEL
ALT: 31 IU/L (ref 0–44)
AST: 27 IU/L (ref 0–40)
Albumin: 4.2 g/dL (ref 3.7–4.7)
Alkaline Phosphatase: 97 IU/L (ref 39–117)
Bilirubin Total: 0.7 mg/dL (ref 0.0–1.2)
Bilirubin, Direct: 0.19 mg/dL (ref 0.00–0.40)
Total Protein: 6.6 g/dL (ref 6.0–8.5)

## 2018-12-13 LAB — BASIC METABOLIC PANEL
BUN/Creatinine Ratio: 24 (ref 10–24)
BUN: 25 mg/dL (ref 8–27)
CO2: 25 mmol/L (ref 20–29)
Calcium: 8.8 mg/dL (ref 8.6–10.2)
Chloride: 104 mmol/L (ref 96–106)
Creatinine, Ser: 1.05 mg/dL (ref 0.76–1.27)
GFR calc Af Amer: 79 mL/min/{1.73_m2} (ref >59)
GFR calc non Af Amer: 68 mL/min/{1.73_m2} (ref >59)
Glucose: 106 mg/dL — ABNORMAL HIGH (ref 65–99)
Potassium: 4 mmol/L (ref 3.5–5.2)
Sodium: 141 mmol/L (ref 134–144)

## 2018-12-13 LAB — LIPID PANEL
Chol/HDL Ratio: 2.5 ratio (ref 0.0–5.0)
Cholesterol, Total: 144 mg/dL (ref 100–199)
HDL: 58 mg/dL (ref >39)
LDL Chol Calc (NIH): 73 mg/dL (ref 0–99)
Triglycerides: 64 mg/dL (ref 0–149)
VLDL Cholesterol Cal: 13 mg/dL (ref 5–40)

## 2018-12-13 LAB — TSH: TSH: 4.74 u[IU]/mL — ABNORMAL HIGH (ref 0.450–4.500)

## 2018-12-13 LAB — PRO B NATRIURETIC PEPTIDE: NT-Pro BNP: 312 pg/mL (ref 0–486)

## 2018-12-13 MED ORDER — AMIODARONE HCL 200 MG PO TABS: ORAL_TABLET | ORAL | 1 refills | Status: DC

## 2018-12-13 NOTE — Telephone Encounter (Signed)
 *  STAT* If patient is at the pharmacy, call can be transferred to refill team.   1. Which medications need to be refilled? (please list name of each medication and dose if known) amiodarone (PACERONE) 200 MG tablet  2. Which pharmacy/location (including street and city if local pharmacy) is medication to be sent to? [facility] in [address]  3. Do they need a 30 day or 90 day supply? 90  Patient is completely out

## 2018-12-13 NOTE — Telephone Encounter (Signed)
Pt's medication was sent to pt's pharmacy as requested. Confirmation received.  °

## 2018-12-14 ENCOUNTER — Other Ambulatory Visit: Payer: Self-pay

## 2018-12-14 MED ORDER — AMIODARONE HCL 200 MG PO TABS: ORAL_TABLET | ORAL | 1 refills | Status: DC

## 2018-12-20 ENCOUNTER — Ambulatory Visit (INDEPENDENT_AMBULATORY_CARE_PROVIDER_SITE_OTHER): Payer: Medicare Other | Admitting: Internal Medicine

## 2018-12-20 ENCOUNTER — Encounter: Payer: Self-pay | Admitting: Internal Medicine

## 2018-12-20 ENCOUNTER — Other Ambulatory Visit: Payer: Self-pay

## 2018-12-20 VITALS — BP 116/54 | HR 64 | Ht 71.0 in | Wt 230.4 lb

## 2018-12-20 DIAGNOSIS — I10 Essential (primary) hypertension: Secondary | ICD-10-CM | POA: Diagnosis not present

## 2018-12-20 DIAGNOSIS — I25119 Atherosclerotic heart disease of native coronary artery with unspecified angina pectoris: Secondary | ICD-10-CM

## 2018-12-20 DIAGNOSIS — I493 Ventricular premature depolarization: Secondary | ICD-10-CM | POA: Diagnosis not present

## 2018-12-20 MED ORDER — AMIODARONE HCL 200 MG PO TABS: 100.0000 mg | ORAL_TABLET | Freq: Every day | ORAL | 3 refills | Status: DC

## 2018-12-20 NOTE — Patient Instructions (Addendum)
Medication Instructions:  Your physician has recommended you make the following change in your medication:   1.  Reduce your amiodarone 200 mg--Take 1/2 tablet (100 mg) by mouth daily   Labwork: None ordered.  Testing/Procedures: You will get a 24 hour holter monitor before your appointment next year with Dr. Lovena Le.  Follow-Up: Your physician wants you to follow-up in: one year with Dr. Lovena Le.   You will receive a reminder letter in the mail two months in advance. If you don't receive a letter, please call our office to schedule the follow-up appointment.   Any Other Special Instructions Will Be Listed Below (If Applicable).  If you need a refill on your cardiac medications before your next appointment, please call your pharmacy.

## 2018-12-20 NOTE — Progress Notes (Signed)
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 He has done well from the perspective of arrhythmias suppression. He notes a slight amount of dizziness. His TSH was minimally elevated. Clinically he is euthyroid.  Allergies  Allergen Reactions  . Sulfamethoxazole-Trimethoprim Other (See Comments)    Carlyn Reichert Syndrome     Current Outpatient Medications  Medication Sig Dispense Refill  . amiodarone (PACERONE) 200 MG tablet TAKE 1 TABLET BY MOUTH EVERY DAY Monday through Friday.  Do NOT take on Saturday or Sunday 90 tablet 1  . amLODipine-olmesartan (AZOR) 10-40 MG tablet Take 1 tablet by mouth daily. 90 tablet 3  . aspirin 81 MG tablet Take 81 mg by mouth at bedtime.     Marland Kitchen atorvastatin (LIPITOR) 80 MG tablet TAKE 1 TABLET (80 MG TOTAL) BY MOUTH DAILY. 90 tablet 1  . carvedilol (COREG) 6.25 MG tablet TAKE 1 TABLET (6.25 MG TOTAL) BY MOUTH 2 (TWO) TIMES DAILY. 180 tablet 2  . ezetimibe (ZETIA) 10 MG tablet TAKE 1 TABLET BY MOUTH EVERY DAY (Patient taking differently: Take 40 mg by mouth daily. ) 90 tablet 2  . furosemide (LASIX) 40 MG tablet TAKE 1 TABLET BY MOUTH EVERY DAY . TAKE ADDITIONAL 40MG  AS NEEDED FOR SWELLING AND OR WEIGHT 180 tablet 2  . hydrALAZINE (APRESOLINE) 25 MG tablet TAKE 1 TABLET BY MOUTH TWICE A DAY 180 tablet 2  . ibuprofen (ADVIL,MOTRIN) 800 MG tablet TAKE 1 TABLET BY MOUTH EVERY 8 HOURS AS NEEDED (Patient taking differently: Take 800 mg by mouth every 8 (eight) hours as needed for mild pain. ) 90 tablet 0  . levothyroxine (SYNTHROID) 50 MCG tablet Take 1 tablet (50 mcg total) by mouth daily before breakfast. 90 tablet 3  . Multiple Vitamin (MULTIVITAMIN WITH MINERALS) TABS Take 1 tablet by mouth daily.     Current Facility-Administered Medications  Medication Dose Route Frequency Provider Last Rate Last Dose  . 0.9 %  sodium chloride infusion  500 mL Intravenous Once Armbruster, Carlota Raspberry, MD      . 0.9 %  sodium chloride infusion  500 mL  Intravenous Once Armbruster, Carlota Raspberry, MD         Past Medical History:  Diagnosis Date  . Aortic stenosis 2008  . Arthritis   . Blood transfusion   . Blood transfusion without reported diagnosis   . CAD (coronary artery disease) 2008  . Cataract   . Complication of anesthesia    hallucinated once  . H/O hiatal hernia   . 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 Hx of colonic polyps   . Hyperlipidemia   . Hypertension   . Thyroid disease     ROS:   All systems reviewed and negative except as noted in the HPI.   Past Surgical History:  Procedure Laterality Date  . AORTIC VALVE REPLACEMENT  2009   Bioprosthetic at [place]  2008  . CHOLECYSTECTOMY  1982  . COLONOSCOPY    . CORONARY ARTERY BYPASS GRAFT  2009   SVG-PDA w/ AVR  . CORRECTION HAMMER TOE  2011   BILATERAL  . 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 POLYPECTOMY    . TONSILLECTOMY    . TOTAL HIP ARTHROPLASTY Left 2000  . TOTAL KNEE ARTHROPLASTY  2000   left  . TOTAL KNEE ARTHROPLASTY  12/01/2011   Procedure: TOTAL KNEE ARTHROPLASTY;  Surgeon: Pietro Cassis  Alvan Dame, MD;  Location: [facility];  Service: Orthopedics;  Laterality: Right;  . WEIL OSTEOTOMY  05/28/2011     Family History  Problem Relation Age of Onset  . Colon cancer Mother 37  . Heart attack Father   . Hypertension Other   . Hyperlipidemia Other   . Heart attack Brother   . Hypertension Sister   . Hypertension Brother   . Stroke Neg Hx   . Esophageal cancer Neg Hx   . Rectal cancer Neg Hx   . Stomach cancer Neg Hx      Social History   Socioeconomic History  . Marital status: Married    Spouse name: Not on file  . Number of children: Not on file  . Years of education: Not on file  . Highest education level: Not on file  Occupational History  . Occupation: retired  Scientific laboratory technician  . Financial resource strain: Not on file  . Food insecurity    Worry: Not on file     Inability: Not on file  . Transportation needs    Medical: Not on file    Non-medical: Not on file  Tobacco Use  . Smoking status: Former Smoker    Quit date: 05/25/1973    Years since quitting: 45.6  . Smokeless tobacco: Never Used  Substance and Sexual Activity  . Alcohol use: Not Currently    Comment: rare  . Drug use: No  . Sexual activity: Not Currently  Lifestyle  . Physical activity    Days per week: Not on file    Minutes per session: Not on file  . Stress: Not on file  Relationships  . Social Herbalist on phone: Not on file    Gets together: Not on file    Attends religious service: Not on file    Active member of club or organization: Not on file    Attends meetings of clubs or organizations: Not on file    Relationship status: Not on file  . Intimate partner violence    Fear of current or ex partner: Not on file    Emotionally abused: Not on file    Physically abused: Not on file    Forced sexual activity: Not on file  Other Topics Concern  . Not on file  Social History Narrative   Regular exercise-yes. Pt lives in [place] with his wife.     BP (!) 116/54   Pulse 64   Ht 5\' 11"  (1.803 m)   Wt 230 lb 6.4 oz (104.5 kg)   SpO2 98%   BMI 32.13 kg/m   Physical Exam:  Well appearing NAD HEENT: Unremarkable Neck:  No JVD, no thyromegally Lymphatics:  No adenopathy Back:  No CVA tenderness Lungs:  Clear with no wheezes HEART:  Regular rate rhythm, no murmurs, no rubs, no clicks Abd:  soft, positive bowel sounds, no organomegally, no rebound, no guarding Ext:  2 plus pulses, no edema, no cyanosis, no clubbing Skin:  No rashes no nodules Neuro:  CN II through XII intact, motor grossly intact  EKG - nsr with RBBB  Assess/Plan: 1. 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 He will reduce his dose of amiodarone. 2. Hypothyroid - he is on synthroid and clinically euthyroid. He will continue thyroid supplementation. 3. CAD - he denies anginal  symptoms.   Mikle Bosworth.D.

## 2019-01-16 ENCOUNTER — Ambulatory Visit: Payer: Medicare Other | Admitting: Internal Medicine

## 2019-01-18 DIAGNOSIS — D2372 Other benign neoplasm of skin of left lower limb, including hip: Secondary | ICD-10-CM | POA: Diagnosis not present

## 2019-01-18 DIAGNOSIS — M79672 Pain in left foot: Secondary | ICD-10-CM | POA: Diagnosis not present

## 2019-02-20 ENCOUNTER — Telehealth: Payer: Self-pay | Admitting: Internal Medicine

## 2019-02-20 DIAGNOSIS — I10 Essential (primary) hypertension: Secondary | ICD-10-CM

## 2019-02-20 MED ORDER — CARVEDILOL 6.25 MG PO TABS: 6.2500 mg | ORAL_TABLET | Freq: Two times a day (BID) | ORAL | 3 refills | Status: DC

## 2019-02-20 MED ORDER — AMLODIPINE-OLMESARTAN 10-40 MG PO TABS: 1.0000 | ORAL_TABLET | Freq: Every day | ORAL | 3 refills | Status: DC

## 2019-02-20 MED ORDER — AMIODARONE HCL 200 MG PO TABS: 100.0000 mg | ORAL_TABLET | Freq: Every day | ORAL | 3 refills | Status: DC

## 2019-02-20 MED ORDER — EZETIMIBE 10 MG PO TABS: 10.0000 mg | ORAL_TABLET | Freq: Every day | ORAL | 3 refills | Status: DC

## 2019-02-20 MED ORDER — LEVOTHYROXINE SODIUM 50 MCG PO TABS: 50.0000 ug | ORAL_TABLET | Freq: Every day | ORAL | 3 refills | Status: DC

## 2019-02-20 MED ORDER — FUROSEMIDE 40 MG PO TABS: ORAL_TABLET | ORAL | 3 refills | Status: DC

## 2019-02-20 MED ORDER — ATORVASTATIN CALCIUM 80 MG PO TABS: 80.0000 mg | ORAL_TABLET | Freq: Every day | ORAL | 3 refills | Status: DC

## 2019-02-20 MED ORDER — HYDRALAZINE HCL 25 MG PO TABS: 25.0000 mg | ORAL_TABLET | Freq: Two times a day (BID) | ORAL | 3 refills | Status: DC

## 2019-02-20 NOTE — Telephone Encounter (Signed)
Advised Pt to continue amiodarone 100  Mg daily.   [facility] to [facility] per Pt.  Will need rest of medications sent.

## 2019-02-20 NOTE — Telephone Encounter (Signed)
New Message:    Pt wants to know how is he supposed to continue taking his Amiodarone?

## 2019-02-20 NOTE — Telephone Encounter (Signed)
New Message:     *STAT* If patient is at the pharmacy, call can be transferred to refill team.   1. Which medications need to be refilled? (please list name of each medication and dose if known)  Pt said all his medicine need to be called in- he changed pharmacy for this year  2. Which pharmacy/location (including street and city if local pharmacy) is medication to be sent to?[phone]  3. Do they need a 30 day or 90 day supply?  90 days and refills

## 2019-02-20 NOTE — Telephone Encounter (Signed)
[facility] per pt

## 2019-02-20 NOTE — Telephone Encounter (Signed)
Attempted outreach by phone with no answer.  Sent response via mychart.

## 2019-02-23 DIAGNOSIS — M79672 Pain in left foot: Secondary | ICD-10-CM | POA: Diagnosis not present

## 2019-02-23 DIAGNOSIS — D2372 Other benign neoplasm of skin of left lower limb, including hip: Secondary | ICD-10-CM | POA: Diagnosis not present

## 2019-03-20 ENCOUNTER — Telehealth: Payer: Self-pay | Admitting: Internal Medicine

## 2019-03-20 NOTE — Telephone Encounter (Signed)
Patient goes for his second covid shot on Saturday. He was advised he may want to take tylenol or advil about an hour prior to shot. He want's to know which one Dr. Harrington Challenger would recommend.

## 2019-03-21 NOTE — Telephone Encounter (Signed)
Called pt   He had vaccine already  Didn't feel a thing.   Feels great  Wants to know when his next appt is to see me   I am not sure He should be seen in end March/APril  2021

## 2019-03-21 NOTE — Telephone Encounter (Signed)
Scheduled next appointment. Notified patient via MyChart portal.

## 2019-04-27 DIAGNOSIS — M79672 Pain in left foot: Secondary | ICD-10-CM | POA: Diagnosis not present

## 2019-05-01 DIAGNOSIS — L820 Inflamed seborrheic keratosis: Secondary | ICD-10-CM | POA: Diagnosis not present

## 2019-05-01 DIAGNOSIS — L570 Actinic keratosis: Secondary | ICD-10-CM | POA: Diagnosis not present

## 2019-05-01 DIAGNOSIS — L720 Epidermal cyst: Secondary | ICD-10-CM | POA: Diagnosis not present

## 2019-05-04 DIAGNOSIS — H353131 Nonexudative age-related macular degeneration, bilateral, early dry stage: Secondary | ICD-10-CM | POA: Diagnosis not present

## 2019-05-04 DIAGNOSIS — H40003 Preglaucoma, unspecified, bilateral: Secondary | ICD-10-CM | POA: Diagnosis not present

## 2019-05-21 NOTE — Progress Notes (Signed)
Cardiology Office Note   Date:  05/22/2019   ID:  Delia Bedillion, DOB 1940-12-18, MRN [id number]  PCP:  [name], MD  Cardiologist:   [name], MD   F/U of CAD     History of Present Illness: Luis Herrera is a 79 y.o. male with a history of CAD (s/p CABG with SVG to PDA), AV dz (s/p AVR) HTN and PVCs  I sw him in the fall   He is also followed by Luis Herrera   Amiodarone decreased PVCs  40% to 9%     The patient denies chest pain.  He is very busy during the day.  He says he sleeps about 6 hours per night goes to bed at 10 and wakes up couple times to go to the bathroom.  He says he finds he is by the end of the evening just tired he did not used to be like this.During the day, however, he does not need to stop doing things because of shortness of breath.  He denies palpitations.  No lower extremity edema.       Current Meds  Medication Sig  . amiodarone (PACERONE) 200 MG tablet Take 0.5 tablets (100 mg total) by mouth daily.  Marland Kitchen amLODipine-olmesartan (AZOR) 10-40 MG tablet Take 1 tablet by mouth daily.  Marland Kitchen aspirin 81 MG tablet Take 81 mg by mouth at bedtime.   Marland Kitchen atorvastatin (LIPITOR) 80 MG tablet Take 1 tablet (80 mg total) by mouth daily at 6 PM.  . carvedilol (COREG) 6.25 MG tablet Take 1 tablet (6.25 mg total) by mouth 2 (two) times daily.  Marland Kitchen ezetimibe (ZETIA) 10 MG tablet Take 1 tablet (10 mg total) by mouth daily.  . furosemide (LASIX) 40 MG tablet TAKE 1 TABLET BY MOUTH EVERY DAY . TAKE ADDITIONAL 40MG  AS NEEDED FOR SWELLING AND OR WEIGHT  . hydrALAZINE (APRESOLINE) 25 MG tablet Take 1 tablet (25 mg total) by mouth 2 (two) times daily.  Marland Kitchen ibuprofen (ADVIL,MOTRIN) 800 MG tablet TAKE 1 TABLET BY MOUTH EVERY 8 HOURS AS NEEDED (Patient taking differently: Take 800 mg by mouth every 8 (eight) hours as needed for mild pain. )  . levothyroxine (SYNTHROID) 50 MCG tablet Take 1 tablet (50 mcg total) by mouth daily before breakfast.  . Multiple Vitamin (MULTIVITAMIN WITH  MINERALS) TABS Take 1 tablet by mouth daily.   Current Facility-Administered Medications for the 05/22/19 encounter (Office Visit) with Fay Records, MD  Medication  . 0.9 %  sodium chloride infusion  . 0.9 %  sodium chloride infusion     Allergies:   Sulfamethoxazole-trimethoprim   Past Medical History:  Diagnosis Date  . Aortic stenosis 2008  . Arthritis   . Blood transfusion   . Blood transfusion without reported diagnosis   . CAD (coronary artery disease) 2008  . Cataract   . Complication of anesthesia    hallucinated once  . H/O hiatal hernia   . 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 Hx of colonic polyps   . Hyperlipidemia   . Hypertension   . Thyroid disease     Past Surgical History:  Procedure Laterality Date  . AORTIC VALVE REPLACEMENT  2009   Bioprosthetic at [place]  2008  . CHOLECYSTECTOMY  1982  . COLONOSCOPY    . CORONARY ARTERY BYPASS GRAFT  2009   SVG-PDA w/ AVR  . CORRECTION HAMMER TOE  2011   BILATERAL  . 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 POLYPECTOMY    . TONSILLECTOMY    . TOTAL HIP ARTHROPLASTY Left 2000  . TOTAL KNEE ARTHROPLASTY  2000   left  . TOTAL KNEE ARTHROPLASTY  12/01/2011   Procedure: TOTAL KNEE ARTHROPLASTY;  Surgeon: Mauri Pole, MD;  Location: [facility];  Service: Orthopedics;  Laterality: Right;  . WEIL OSTEOTOMY  05/28/2011     Social History:  The patient  reports that he quit smoking about 46 years ago. He has never used smokeless tobacco. He reports previous alcohol use. He reports that he does not use drugs.   Family History:  The patient's family history includes Colon cancer (age of onset: 35) in his mother; Heart attack in his brother and father; Hyperlipidemia in an other family member; Hypertension in his brother, sister, and another family member.    ROS:  Please see the history of present illness. All other systems are reviewed and   Negative to the above problem except as noted.    PHYSICAL EXAM: VS:  BP (!) 130/58   Pulse 62   Ht 5\' 11"  (1.803 m)   Wt 235 lb 12.8 oz (107 kg)   SpO2 98%   BMI 32.89 kg/m   GEN: Obese 79 year old, in no acute distress  HEENT: normal  Neck: no JVD, bilateral bruits.   Cardiac: RRR; no murmurs, rubs, or gallops,no lower extremity edema  Respiratory:  clear to auscultation bilaterally, normal work of breathing GI: soft, nontender, nondistended, + BS  No hepatomegaly  MS: no deformity Moving all extremities   Skin: warm and dry, no rash Neuro:  Strength and sensation are intact Psych: euthymic mood, full affect   EKG:  EKG is not ordered today.   Lipid Panel    Component Value Date/Time   CHOL 144 12/13/2018 0832   TRIG 64 12/13/2018 0832   HDL 58 12/13/2018 0832   CHOLHDL 2.5 12/13/2018 0832   CHOLHDL 3 11/14/2015 0954   VLDL 16.8 11/14/2015 0954   LDLCALC 73 12/13/2018 0832      Wt Readings from Last 3 Encounters:  05/22/19 235 lb 12.8 oz (107 kg)  12/20/18 230 lb 6.4 oz (104.5 kg)  12/12/18 220 lb (99.8 kg)      ASSESSMENT AND PLAN: 1.  Fatigue.  I do not think this sounds like a cardiac limitation.  Again when he is doing activity during the day his energy level is okay, he is able to keep up with things  2 CAD.  Patient denies chest pain.  Myoview in 2018 without ischemia.  3 history of PVCs.  Patient currently on amiodarone 100 mg/days followed by Luis Herrera for PVC and his burden is decreased.  We will check TSH and LFTs today.  4.  Aortic valve disease status post AVR.  Last echo in 2020, aortic valve bioprosthesis mean gradient of 15.  Follow  5 carotid bruits.  We will set up for carotid ultrasound.  6.  Lipids.  Keep on current regimen.  Tentatively I will follow the patient up in 1 years time he follows with Luis Herrera in the interval    Current medicines are reviewed at length with the patient today.  The patient does not have concerns  regarding medicines.  Signed, [name], MD  05/22/2019 10:00 AM    [address], [place], [place]  [address] Phone: [phone]; Fax: [phone]

## 2019-05-22 ENCOUNTER — Encounter: Payer: Self-pay | Admitting: Internal Medicine

## 2019-05-22 ENCOUNTER — Other Ambulatory Visit: Payer: Self-pay

## 2019-05-22 ENCOUNTER — Ambulatory Visit: Payer: Medicare Other | Admitting: Internal Medicine

## 2019-05-22 VITALS — BP 130/58 | HR 62 | Ht 71.0 in | Wt 235.8 lb

## 2019-05-22 DIAGNOSIS — I10 Essential (primary) hypertension: Secondary | ICD-10-CM | POA: Diagnosis not present

## 2019-05-22 DIAGNOSIS — R0989 Other specified symptoms and signs involving the circulatory and respiratory systems: Secondary | ICD-10-CM

## 2019-05-22 DIAGNOSIS — I2510 Atherosclerotic heart disease of native coronary artery without angina pectoris: Secondary | ICD-10-CM | POA: Diagnosis not present

## 2019-05-22 DIAGNOSIS — R609 Edema, unspecified: Secondary | ICD-10-CM | POA: Diagnosis not present

## 2019-05-22 DIAGNOSIS — E039 Hypothyroidism, unspecified: Secondary | ICD-10-CM | POA: Diagnosis not present

## 2019-05-22 DIAGNOSIS — R6252 Short stature (child): Secondary | ICD-10-CM | POA: Diagnosis not present

## 2019-05-22 LAB — HEPATIC FUNCTION PANEL
ALT: 17 IU/L (ref 0–44)
AST: 19 IU/L (ref 0–40)
Albumin: 4.1 g/dL (ref 3.7–4.7)
Alkaline Phosphatase: 101 IU/L (ref 39–117)
Bilirubin Total: 0.7 mg/dL (ref 0.0–1.2)
Bilirubin, Direct: 0.21 mg/dL (ref 0.00–0.40)
Total Protein: 6.6 g/dL (ref 6.0–8.5)

## 2019-05-22 LAB — BASIC METABOLIC PANEL
BUN/Creatinine Ratio: 25 — ABNORMAL HIGH (ref 10–24)
BUN: 22 mg/dL (ref 8–27)
CO2: 26 mmol/L (ref 20–29)
Calcium: 8.9 mg/dL (ref 8.6–10.2)
Chloride: 108 mmol/L — ABNORMAL HIGH (ref 96–106)
Creatinine, Ser: 0.87 mg/dL (ref 0.76–1.27)
GFR calc Af Amer: 96 mL/min/{1.73_m2} (ref >59)
GFR calc non Af Amer: 83 mL/min/{1.73_m2} (ref >59)
Glucose: 96 mg/dL (ref 65–99)
Potassium: 4.4 mmol/L (ref 3.5–5.2)
Sodium: 145 mmol/L — ABNORMAL HIGH (ref 134–144)

## 2019-05-22 LAB — TSH: TSH: 2.66 u[IU]/mL (ref 0.450–4.500)

## 2019-05-22 LAB — PRO B NATRIURETIC PEPTIDE: NT-Pro BNP: 333 pg/mL (ref 0–486)

## 2019-05-22 NOTE — Patient Instructions (Signed)
Medication Instructions:  No changes *If you need a refill on your cardiac medications before your next appointment, please call your pharmacy*   Lab Work: Today: bmet, liver function, bnp, tsh If you have labs (blood work) drawn today and your tests are completely normal, you will receive your results only by  . MyChart Message (if you have MyChart) OR  If you have any lab test that is abnormal or we need to change your treatment, we will call you to review the results.   Testing/Procedures: Your physician has requested that you have a carotid duplex. This test is an ultrasound of the carotid arteries in your neck. It looks at blood flow through these arteries that supply the brain with blood. Allow one hour for this exam. There are no restrictions or special instructions.   Follow-Up: At [facility], you and your health needs are our priority.  As part of our continuing mission to provide you with exceptional heart care, we have created designated Provider Care Teams.  These Care Teams include your primary Cardiologist (physician) and Advanced Practice Providers (APPs -  Physician Assistants and Nurse Practitioners) who all work together to provide you with the care you need, when you need it.  Your next appointment:   12 month(s)  The format for your next appointment:   In Person  Provider:   You may see [name], MD or one of the following Advanced Practice Providers on your designated Care Team:    Richardson Dopp, PA-C  Robbie Lis, [place]   Other Instructions

## 2019-05-30 ENCOUNTER — Ambulatory Visit (HOSPITAL_COMMUNITY)
Admission: RE | Admit: 2019-05-30 | Discharge: 2019-05-30 | Disposition: A | Discharge status: Home / Self Care | Payer: Medicare Other | Source: Ambulatory Visit | Attending: Cardiovascular Disease | Admitting: Cardiovascular Disease

## 2019-05-30 ENCOUNTER — Other Ambulatory Visit: Payer: Self-pay

## 2019-05-30 DIAGNOSIS — I10 Essential (primary) hypertension: Secondary | ICD-10-CM

## 2019-05-30 DIAGNOSIS — I2510 Atherosclerotic heart disease of native coronary artery without angina pectoris: Secondary | ICD-10-CM

## 2019-05-30 DIAGNOSIS — E039 Hypothyroidism, unspecified: Secondary | ICD-10-CM

## 2019-05-30 DIAGNOSIS — R0989 Other specified symptoms and signs involving the circulatory and respiratory systems: Secondary | ICD-10-CM

## 2019-05-31 ENCOUNTER — Telehealth: Payer: Self-pay

## 2019-05-31 NOTE — Telephone Encounter (Signed)
-----   Message from Fay Records, MD sent at 05/31/2019  3:12 PM EDT ----- Mild plaquing of carotid arteries    Keep on same regimen

## 2019-05-31 NOTE — Telephone Encounter (Signed)
The patient has been notified of the result and verbalized understanding.  All questions (if any) were answered. Wilma Flavin, RN 05/31/2019 4:01 PM

## 2019-07-11 ENCOUNTER — Other Ambulatory Visit: Payer: Self-pay

## 2019-07-11 ENCOUNTER — Ambulatory Visit: Payer: Self-pay

## 2019-07-11 ENCOUNTER — Encounter: Payer: Self-pay | Admitting: Physician Assistant

## 2019-07-11 ENCOUNTER — Telehealth: Payer: Self-pay | Admitting: Orthopedic Surgery

## 2019-07-11 ENCOUNTER — Ambulatory Visit: Payer: Medicare Other | Admitting: Orthopedic Surgery

## 2019-07-11 VITALS — Ht 71.0 in | Wt 235.0 lb

## 2019-07-11 DIAGNOSIS — M25561 Pain in right knee: Secondary | ICD-10-CM

## 2019-07-11 MED ORDER — MUPIROCIN 2 % EX OINT: 1.0000 "application " | TOPICAL_OINTMENT | Freq: Two times a day (BID) | CUTANEOUS | 3 refills | Status: DC

## 2019-07-11 NOTE — Telephone Encounter (Signed)
Patient called requesting Dr. Sharol Given or nurse call to [facility] on [address] [place] with compression of socks needed for patient. Patient phone number is [phone].

## 2019-07-12 ENCOUNTER — Encounter: Payer: Self-pay | Admitting: Orthopedic Surgery

## 2019-07-12 NOTE — Progress Notes (Signed)
Office Visit Note   Patient: Luis Herrera           Date of Birth: 02-06-1941           MRN: [id number] Visit Date: 07/11/2019              Requested by: [name], MD [address] [place],  [place] [address] PCP: [name], MD  Chief Complaint  Patient presents with  . Right Knee - Pain      HPI: Patient is a 79 year old gentleman who presents with acute right knee pain.  Patient states that he fell on the right knee on Thursday tripped on the carpet fell directly on a patio striking his total knee.  Patient complains of bruising swelling and abrasion over the patella.  Patient states he has decreased range of motion unable to fully extend his knee.  Assessment & Plan: Visit Diagnoses:  1. Acute pain of right knee     Plan: A prescription for Bactroban was called and recommended knee-high compression stockings size 2 extra-large follow-up if there is any change in symptoms.  Follow-Up Instructions: No follow-ups on file.   Ortho Exam  Patient is alert, oriented, no adenopathy, well-dressed, normal affect, normal respiratory effort. Examination patient has ecchymosis bruising and swelling in the right lower extremity the right calf is 48 cm in circumference the left calf is 46 cm in circumference.  Patient calf is soft nontender no pain with dorsiflexion of the ankle no evidence of a DVT.  Patient can do a straight leg raise without extensor lag there is no palpable defect of the patella or quad tendon.  Varus and valgus is stable.  Patient does have limited range of motion.  He does have a superficial abrasion over the patella which is 2 cm in diameter 0.1 mm deep with healthy granulation tissue no exposed deep soft tissue  Imaging: No results found. No images are attached to the encounter.  Labs: Lab Results  Component Value Date   ESRSEDRATE 19 04/28/2018   REPTSTATUS 08/03/2013 FINAL 08/02/2013   GRAMSTAIN Few 07/24/2013   GRAMSTAIN WBC  present-both PMN and Mononuclear 07/24/2013   GRAMSTAIN No Squamous Epithelial Cells Seen 07/24/2013   GRAMSTAIN Rare Gram Positive Cocci In Pairs 07/24/2013   CULT NO GROWTH Performed at [facility] 08/02/2013   LABORGA Multiple Organisms Present,None Predominant 07/24/2013     Lab Results  Component Value Date   ALBUMIN 4.1 05/22/2019   ALBUMIN 4.2 12/13/2018   ALBUMIN 3.1 (L) 04/13/2018    Lab Results  Component Value Date   MG 2.1 04/14/2018   MG 1.8 06/07/2015   No results found for: VD25OH  No results found for: PREALBUMIN CBC EXTENDED Latest Ref Rng & Units 12/13/2018 04/28/2018 04/14/2018  WBC 3.4 - 10.8 x10E3/uL 7.7 9.1 9.1  RBC 4.14 - 5.80 x10E6/uL 4.01(L) 3.95(L) 3.72(L)  HGB 13.0 - 17.7 g/dL 12.3(L) 12.4(L) 11.6(L)  HCT 37.5 - 51.0 % 37.6 37.4(L) 36.2(L)  PLT 150 - 450 x10E3/uL 202 199 164  NEUTROABS 1.4 - 7.0 x10E3/uL 5.1 - -  LYMPHSABS 0.7 - 3.1 x10E3/uL 1.5 - -     Body mass index is 32.78 kg/m.  Orders:  Orders Placed This Encounter  Procedures  . XR Knee 1-2 Views Right   Meds ordered this encounter  Medications  . mupirocin ointment (BACTROBAN) 2 %    Sig: Apply 1 application topically 2 (two) times daily. Apply to the affected area 2  times a day    Dispense:  22 g    Refill:  3     Procedures: No procedures performed  Clinical Data: No additional findings.  ROS:  All other systems negative, except as noted in the HPI. Review of Systems  Objective: Vital Signs: Ht 5\' 11"  (1.803 m)   Wt 235 lb (106.6 kg)   BMI 32.78 kg/m   Specialty Comments:  No specialty comments available.  PMFS History: Patient Active Problem List   Diagnosis Date Noted  . Hypertensive urgency 04/13/2018  . Chest pain 04/13/2018  . UTI (urinary tract infection) 04/13/2018  . Hypothyroidism 04/13/2018  . Aortic valve stenosis 09/27/2017  . Right bundle branch block 09/27/2017  . CAD (coronary artery disease) 09/27/2017  . 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 Acquired claw toe of right foot 06/04/2016  . Class 1 obesity with body mass index (BMI) of 31.0 to 31.9 in adult 12/02/2011  . 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 Back pain of thoracolumbar region 09/01/2010  . Hyperlipidemia with target LDL less than 130 04/10/2010  . Essential hypertension 04/10/2010  . 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 Aortic stenosis 2008  . Arthritis   . Blood transfusion   . Blood transfusion without reported diagnosis   . CAD (coronary artery disease) 2008  . Cataract   . Complication of anesthesia    hallucinated once  . H/O hiatal hernia   . 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 Hx of colonic polyps   . Hyperlipidemia   . Hypertension   . Thyroid disease     Family History  Problem Relation Age of Onset  . Colon cancer Mother 66  . Heart attack Father   . Hypertension Other   . Hyperlipidemia Other   . Heart attack Brother   . Hypertension Sister   . Hypertension Brother   . Stroke Neg Hx   . Esophageal cancer Neg Hx   . Rectal cancer Neg Hx   . Stomach cancer Neg Hx     Past Surgical History:  Procedure Laterality Date  . AORTIC VALVE REPLACEMENT  2009   Bioprosthetic at [place]  2008  . CHOLECYSTECTOMY  1982  . COLONOSCOPY    . CORONARY ARTERY BYPASS GRAFT  2009   SVG-PDA w/ AVR  . CORRECTION HAMMER TOE  2011   BILATERAL  . 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 POLYPECTOMY    . TONSILLECTOMY    . TOTAL HIP ARTHROPLASTY Left 2000  . TOTAL KNEE ARTHROPLASTY  2000   left  . TOTAL KNEE ARTHROPLASTY  12/01/2011   Procedure: TOTAL KNEE ARTHROPLASTY;  Surgeon: Mauri Pole, MD;  Location: [facility];  Service: Orthopedics;  Laterality: Right;  . WEIL OSTEOTOMY  05/28/2011   Social History   Occupational History  . Occupation: retired  Tobacco Use  . Smoking status: Former Smoker    Quit  date: 05/25/1973    Years since quitting: 46.1  . Smokeless tobacco: Never Used  Substance and Sexual Activity  . Alcohol use: Not Currently    Comment: rare  . Drug use: No  . Sexual activity: Not Currently

## 2019-07-13 NOTE — Telephone Encounter (Signed)
This has been done.

## 2019-07-19 DIAGNOSIS — L02415 Cutaneous abscess of right lower limb: Secondary | ICD-10-CM | POA: Diagnosis not present

## 2019-07-19 DIAGNOSIS — L0889 Other specified local infections of the skin and subcutaneous tissue: Secondary | ICD-10-CM | POA: Diagnosis not present

## 2019-07-26 DIAGNOSIS — M7981 Nontraumatic hematoma of soft tissue: Secondary | ICD-10-CM | POA: Diagnosis not present

## 2019-07-26 DIAGNOSIS — L720 Epidermal cyst: Secondary | ICD-10-CM | POA: Diagnosis not present

## 2019-07-28 ENCOUNTER — Telehealth: Payer: Self-pay | Admitting: Gastroenterology

## 2019-07-28 NOTE — Telephone Encounter (Signed)
Left message for patient to call back  

## 2019-07-31 NOTE — Telephone Encounter (Signed)
Patient reports that he is not longer having constipation.  All symptoms relieved.  He thanked me for the call

## 2019-08-02 DIAGNOSIS — D2372 Other benign neoplasm of skin of left lower limb, including hip: Secondary | ICD-10-CM | POA: Diagnosis not present

## 2019-08-02 DIAGNOSIS — M79672 Pain in left foot: Secondary | ICD-10-CM | POA: Diagnosis not present

## 2019-08-28 DIAGNOSIS — M25562 Pain in left knee: Secondary | ICD-10-CM | POA: Diagnosis not present

## 2019-08-29 ENCOUNTER — Ambulatory Visit: Payer: Medicare Other | Admitting: Orthopaedic Surgery

## 2019-08-29 DIAGNOSIS — M25562 Pain in left knee: Secondary | ICD-10-CM | POA: Diagnosis not present

## 2019-09-01 DIAGNOSIS — Z452 Encounter for adjustment and management of vascular access device: Secondary | ICD-10-CM | POA: Diagnosis not present

## 2019-09-01 DIAGNOSIS — T8459XA Infection and inflammatory reaction due to other internal joint prosthesis, initial encounter: Secondary | ICD-10-CM | POA: Diagnosis not present

## 2019-09-01 DIAGNOSIS — T8454XA Infection and inflammatory reaction due to internal left knee prosthesis, initial encounter: Secondary | ICD-10-CM | POA: Diagnosis not present

## 2019-09-01 DIAGNOSIS — Z96652 Presence of left artificial knee joint: Secondary | ICD-10-CM | POA: Diagnosis not present

## 2019-09-01 DIAGNOSIS — E785 Hyperlipidemia, unspecified: Secondary | ICD-10-CM | POA: Diagnosis not present

## 2019-09-01 DIAGNOSIS — M25562 Pain in left knee: Secondary | ICD-10-CM | POA: Diagnosis not present

## 2019-09-01 DIAGNOSIS — N179 Acute kidney failure, unspecified: Secondary | ICD-10-CM | POA: Diagnosis not present

## 2019-09-01 DIAGNOSIS — I4510 Unspecified right bundle-branch block: Secondary | ICD-10-CM | POA: Diagnosis not present

## 2019-09-01 DIAGNOSIS — Z96642 Presence of left artificial hip joint: Secondary | ICD-10-CM | POA: Diagnosis not present

## 2019-09-01 DIAGNOSIS — Z87891 Personal history of nicotine dependence: Secondary | ICD-10-CM | POA: Diagnosis not present

## 2019-09-01 DIAGNOSIS — Z953 Presence of xenogenic heart valve: Secondary | ICD-10-CM | POA: Diagnosis not present

## 2019-09-01 DIAGNOSIS — Z471 Aftercare following joint replacement surgery: Secondary | ICD-10-CM | POA: Diagnosis not present

## 2019-09-01 DIAGNOSIS — I2510 Atherosclerotic heart disease of native coronary artery without angina pectoris: Secondary | ICD-10-CM | POA: Diagnosis not present

## 2019-09-01 DIAGNOSIS — E039 Hypothyroidism, unspecified: Secondary | ICD-10-CM | POA: Diagnosis not present

## 2019-09-01 DIAGNOSIS — Z7982 Long term (current) use of aspirin: Secondary | ICD-10-CM | POA: Diagnosis not present

## 2019-09-01 DIAGNOSIS — I493 Ventricular premature depolarization: Secondary | ICD-10-CM | POA: Diagnosis not present

## 2019-09-01 DIAGNOSIS — Z951 Presence of aortocoronary bypass graft: Secondary | ICD-10-CM | POA: Diagnosis not present

## 2019-09-01 DIAGNOSIS — I10 Essential (primary) hypertension: Secondary | ICD-10-CM | POA: Diagnosis not present

## 2019-09-01 DIAGNOSIS — Z952 Presence of prosthetic heart valve: Secondary | ICD-10-CM | POA: Diagnosis not present

## 2019-09-01 DIAGNOSIS — T849XXA Unspecified complication of internal orthopedic prosthetic device, implant and graft, initial encounter: Secondary | ICD-10-CM | POA: Diagnosis not present

## 2019-09-01 DIAGNOSIS — D62 Acute posthemorrhagic anemia: Secondary | ICD-10-CM | POA: Diagnosis not present

## 2019-09-01 DIAGNOSIS — E7800 Pure hypercholesterolemia, unspecified: Secondary | ICD-10-CM | POA: Diagnosis not present

## 2019-09-01 DIAGNOSIS — R600 Localized edema: Secondary | ICD-10-CM | POA: Diagnosis not present

## 2019-09-01 DIAGNOSIS — Z79899 Other long term (current) drug therapy: Secondary | ICD-10-CM | POA: Diagnosis not present

## 2019-09-01 DIAGNOSIS — Z8249 Family history of ischemic heart disease and other diseases of the circulatory system: Secondary | ICD-10-CM | POA: Diagnosis not present

## 2019-09-02 DIAGNOSIS — T8459XA Infection and inflammatory reaction due to other internal joint prosthesis, initial encounter: Secondary | ICD-10-CM | POA: Diagnosis not present

## 2019-09-02 DIAGNOSIS — I10 Essential (primary) hypertension: Secondary | ICD-10-CM | POA: Diagnosis not present

## 2019-09-02 DIAGNOSIS — I4510 Unspecified right bundle-branch block: Secondary | ICD-10-CM | POA: Diagnosis not present

## 2019-09-02 DIAGNOSIS — I493 Ventricular premature depolarization: Secondary | ICD-10-CM | POA: Diagnosis not present

## 2019-09-02 DIAGNOSIS — Z952 Presence of prosthetic heart valve: Secondary | ICD-10-CM | POA: Diagnosis not present

## 2019-09-03 DIAGNOSIS — I4510 Unspecified right bundle-branch block: Secondary | ICD-10-CM | POA: Diagnosis not present

## 2019-09-03 DIAGNOSIS — I493 Ventricular premature depolarization: Secondary | ICD-10-CM | POA: Diagnosis not present

## 2019-09-03 DIAGNOSIS — T8459XA Infection and inflammatory reaction due to other internal joint prosthesis, initial encounter: Secondary | ICD-10-CM | POA: Diagnosis not present

## 2019-09-03 DIAGNOSIS — Z952 Presence of prosthetic heart valve: Secondary | ICD-10-CM | POA: Diagnosis not present

## 2019-09-03 DIAGNOSIS — I10 Essential (primary) hypertension: Secondary | ICD-10-CM | POA: Diagnosis not present

## 2019-09-04 DIAGNOSIS — Z952 Presence of prosthetic heart valve: Secondary | ICD-10-CM | POA: Diagnosis not present

## 2019-09-04 DIAGNOSIS — I493 Ventricular premature depolarization: Secondary | ICD-10-CM | POA: Diagnosis not present

## 2019-09-04 DIAGNOSIS — I4510 Unspecified right bundle-branch block: Secondary | ICD-10-CM | POA: Diagnosis not present

## 2019-09-04 DIAGNOSIS — T8459XA Infection and inflammatory reaction due to other internal joint prosthesis, initial encounter: Secondary | ICD-10-CM | POA: Diagnosis not present

## 2019-09-04 DIAGNOSIS — D62 Acute posthemorrhagic anemia: Secondary | ICD-10-CM | POA: Diagnosis not present

## 2019-09-05 DIAGNOSIS — I493 Ventricular premature depolarization: Secondary | ICD-10-CM | POA: Diagnosis not present

## 2019-09-05 DIAGNOSIS — Z452 Encounter for adjustment and management of vascular access device: Secondary | ICD-10-CM | POA: Diagnosis not present

## 2019-09-05 DIAGNOSIS — T8459XA Infection and inflammatory reaction due to other internal joint prosthesis, initial encounter: Secondary | ICD-10-CM | POA: Diagnosis not present

## 2019-09-05 DIAGNOSIS — Z952 Presence of prosthetic heart valve: Secondary | ICD-10-CM | POA: Diagnosis not present

## 2019-09-05 DIAGNOSIS — I4510 Unspecified right bundle-branch block: Secondary | ICD-10-CM | POA: Diagnosis not present

## 2019-09-05 DIAGNOSIS — D62 Acute posthemorrhagic anemia: Secondary | ICD-10-CM | POA: Diagnosis not present

## 2019-09-06 DIAGNOSIS — T8454XA Infection and inflammatory reaction due to internal left knee prosthesis, initial encounter: Secondary | ICD-10-CM | POA: Diagnosis not present

## 2019-09-11 DIAGNOSIS — M25662 Stiffness of left knee, not elsewhere classified: Secondary | ICD-10-CM | POA: Diagnosis not present

## 2019-09-11 DIAGNOSIS — R262 Difficulty in walking, not elsewhere classified: Secondary | ICD-10-CM | POA: Diagnosis not present

## 2019-09-11 DIAGNOSIS — T8454XA Infection and inflammatory reaction due to internal left knee prosthesis, initial encounter: Secondary | ICD-10-CM | POA: Diagnosis not present

## 2019-09-11 DIAGNOSIS — R531 Weakness: Secondary | ICD-10-CM | POA: Diagnosis not present

## 2019-09-12 ENCOUNTER — Inpatient Hospital Stay (HOSPITAL_COMMUNITY)
Admission: EM | Admit: 2019-09-12 | Discharge: 2019-09-19 | DRG: 920 | Disposition: A | Discharge status: Home / Self Care | Payer: Medicare Other | Attending: Internal Medicine | Admitting: Internal Medicine

## 2019-09-12 ENCOUNTER — Encounter (HOSPITAL_COMMUNITY): Payer: Self-pay | Admitting: Emergency Medicine

## 2019-09-12 ENCOUNTER — Telehealth: Payer: Self-pay | Admitting: Internal Medicine

## 2019-09-12 DIAGNOSIS — I351 Nonrheumatic aortic (valve) insufficiency: Secondary | ICD-10-CM | POA: Diagnosis not present

## 2019-09-12 DIAGNOSIS — Z96642 Presence of left artificial hip joint: Secondary | ICD-10-CM | POA: Diagnosis not present

## 2019-09-12 DIAGNOSIS — Z800 Family history of malignant neoplasm of digestive organs: Secondary | ICD-10-CM

## 2019-09-12 DIAGNOSIS — E039 Hypothyroidism, unspecified: Secondary | ICD-10-CM | POA: Diagnosis present

## 2019-09-12 DIAGNOSIS — Z953 Presence of xenogenic heart valve: Secondary | ICD-10-CM

## 2019-09-12 DIAGNOSIS — I493 Ventricular premature depolarization: Secondary | ICD-10-CM | POA: Diagnosis not present

## 2019-09-12 DIAGNOSIS — R062 Wheezing: Secondary | ICD-10-CM

## 2019-09-12 DIAGNOSIS — Z882 Allergy status to sulfonamides status: Secondary | ICD-10-CM | POA: Diagnosis not present

## 2019-09-12 DIAGNOSIS — M7989 Other specified soft tissue disorders: Secondary | ICD-10-CM | POA: Diagnosis not present

## 2019-09-12 DIAGNOSIS — R600 Localized edema: Secondary | ICD-10-CM | POA: Diagnosis not present

## 2019-09-12 DIAGNOSIS — I340 Nonrheumatic mitral (valve) insufficiency: Secondary | ICD-10-CM | POA: Diagnosis not present

## 2019-09-12 DIAGNOSIS — Z952 Presence of prosthetic heart valve: Secondary | ICD-10-CM

## 2019-09-12 DIAGNOSIS — Z95828 Presence of other vascular implants and grafts: Secondary | ICD-10-CM | POA: Diagnosis not present

## 2019-09-12 DIAGNOSIS — M25562 Pain in left knee: Secondary | ICD-10-CM | POA: Diagnosis not present

## 2019-09-12 DIAGNOSIS — Z79890 Hormone replacement therapy (postmenopausal): Secondary | ICD-10-CM

## 2019-09-12 DIAGNOSIS — L89151 Pressure ulcer of sacral region, stage 1: Secondary | ICD-10-CM | POA: Diagnosis present

## 2019-09-12 DIAGNOSIS — E785 Hyperlipidemia, unspecified: Secondary | ICD-10-CM | POA: Diagnosis not present

## 2019-09-12 DIAGNOSIS — D649 Anemia, unspecified: Secondary | ICD-10-CM | POA: Diagnosis not present

## 2019-09-12 DIAGNOSIS — R682 Dry mouth, unspecified: Secondary | ICD-10-CM | POA: Diagnosis not present

## 2019-09-12 DIAGNOSIS — Z20822 Contact with and (suspected) exposure to covid-19: Secondary | ICD-10-CM | POA: Diagnosis not present

## 2019-09-12 DIAGNOSIS — R0600 Dyspnea, unspecified: Secondary | ICD-10-CM

## 2019-09-12 DIAGNOSIS — N179 Acute kidney failure, unspecified: Secondary | ICD-10-CM | POA: Diagnosis not present

## 2019-09-12 DIAGNOSIS — I350 Nonrheumatic aortic (valve) stenosis: Secondary | ICD-10-CM | POA: Diagnosis not present

## 2019-09-12 DIAGNOSIS — D62 Acute posthemorrhagic anemia: Secondary | ICD-10-CM | POA: Diagnosis present

## 2019-09-12 DIAGNOSIS — Y792 Prosthetic and other implants, materials and accessory orthopedic devices associated with adverse incidents: Secondary | ICD-10-CM | POA: Diagnosis present

## 2019-09-12 DIAGNOSIS — Y838 Other surgical procedures as the cause of abnormal reaction of the patient, or of later complication, without mention of misadventure at the time of the procedure: Secondary | ICD-10-CM | POA: Diagnosis present

## 2019-09-12 DIAGNOSIS — Z7982 Long term (current) use of aspirin: Secondary | ICD-10-CM | POA: Diagnosis not present

## 2019-09-12 DIAGNOSIS — T8454XA Infection and inflammatory reaction due to internal left knee prosthesis, initial encounter: Secondary | ICD-10-CM | POA: Diagnosis not present

## 2019-09-12 DIAGNOSIS — T8454XD Infection and inflammatory reaction due to internal left knee prosthesis, subsequent encounter: Secondary | ICD-10-CM

## 2019-09-12 DIAGNOSIS — R748 Abnormal levels of other serum enzymes: Secondary | ICD-10-CM | POA: Diagnosis present

## 2019-09-12 DIAGNOSIS — Z6830 Body mass index (BMI) 30.0-30.9, adult: Secondary | ICD-10-CM

## 2019-09-12 DIAGNOSIS — J90 Pleural effusion, not elsewhere classified: Secondary | ICD-10-CM | POA: Diagnosis not present

## 2019-09-12 DIAGNOSIS — E870 Hyperosmolality and hypernatremia: Secondary | ICD-10-CM | POA: Diagnosis not present

## 2019-09-12 DIAGNOSIS — L8990 Pressure ulcer of unspecified site, unspecified stage: Secondary | ICD-10-CM | POA: Insufficient documentation

## 2019-09-12 DIAGNOSIS — M96840 Postprocedural hematoma of a musculoskeletal structure following a musculoskeletal system procedure: Principal | ICD-10-CM | POA: Diagnosis present

## 2019-09-12 DIAGNOSIS — Z8719 Personal history of other diseases of the digestive system: Secondary | ICD-10-CM

## 2019-09-12 DIAGNOSIS — Z8249 Family history of ischemic heart disease and other diseases of the circulatory system: Secondary | ICD-10-CM

## 2019-09-12 DIAGNOSIS — Z951 Presence of aortocoronary bypass graft: Secondary | ICD-10-CM

## 2019-09-12 DIAGNOSIS — I517 Cardiomegaly: Secondary | ICD-10-CM | POA: Diagnosis not present

## 2019-09-12 DIAGNOSIS — E669 Obesity, unspecified: Secondary | ICD-10-CM | POA: Diagnosis present

## 2019-09-12 DIAGNOSIS — Z87891 Personal history of nicotine dependence: Secondary | ICD-10-CM

## 2019-09-12 DIAGNOSIS — I2510 Atherosclerotic heart disease of native coronary artery without angina pectoris: Secondary | ICD-10-CM | POA: Diagnosis not present

## 2019-09-12 DIAGNOSIS — Z79899 Other long term (current) drug therapy: Secondary | ICD-10-CM

## 2019-09-12 DIAGNOSIS — Z452 Encounter for adjustment and management of vascular access device: Secondary | ICD-10-CM | POA: Diagnosis not present

## 2019-09-12 DIAGNOSIS — R0602 Shortness of breath: Secondary | ICD-10-CM | POA: Diagnosis not present

## 2019-09-12 DIAGNOSIS — I10 Essential (primary) hypertension: Secondary | ICD-10-CM | POA: Diagnosis present

## 2019-09-12 DIAGNOSIS — Z96659 Presence of unspecified artificial knee joint: Secondary | ICD-10-CM | POA: Diagnosis not present

## 2019-09-12 LAB — COMPREHENSIVE METABOLIC PANEL
ALT: 30 U/L (ref 0–44)
AST: 60 U/L — ABNORMAL HIGH (ref 15–41)
Albumin: 2.5 g/dL — ABNORMAL LOW (ref 3.5–5.0)
Alkaline Phosphatase: 52 U/L (ref 38–126)
Anion gap: 8 (ref 5–15)
BUN: 53 mg/dL — ABNORMAL HIGH (ref 8–23)
CO2: 23 mmol/L (ref 22–32)
Calcium: 7.9 mg/dL — ABNORMAL LOW (ref 8.9–10.3)
Chloride: 113 mmol/L — ABNORMAL HIGH (ref 98–111)
Creatinine, Ser: 2.63 mg/dL — ABNORMAL HIGH (ref 0.61–1.24)
GFR calc Af Amer: 26 mL/min — ABNORMAL LOW (ref >60)
GFR calc non Af Amer: 22 mL/min — ABNORMAL LOW (ref >60)
Glucose, Bld: 118 mg/dL — ABNORMAL HIGH (ref 70–99)
Potassium: 3.7 mmol/L (ref 3.5–5.1)
Sodium: 144 mmol/L (ref 135–145)
Total Bilirubin: 1.5 mg/dL — ABNORMAL HIGH (ref 0.3–1.2)
Total Protein: 5.6 g/dL — ABNORMAL LOW (ref 6.5–8.1)

## 2019-09-12 LAB — VITAMIN B12: Vitamin B-12: 553 pg/mL (ref 180–914)

## 2019-09-12 LAB — PROTIME-INR
INR: 1.2 (ref 0.8–1.2)
Prothrombin Time: 14.4 seconds (ref 11.4–15.2)

## 2019-09-12 LAB — SARS CORONAVIRUS 2 BY RT PCR (HOSPITAL ORDER, PERFORMED IN ~~LOC~~ HOSPITAL LAB): SARS Coronavirus 2: NEGATIVE

## 2019-09-12 LAB — PREPARE RBC (CROSSMATCH)

## 2019-09-12 LAB — CBC
HCT: 22.2 % — ABNORMAL LOW (ref 39.0–52.0)
Hemoglobin: 6.3 g/dL — CL (ref 13.0–17.0)
MCH: 31 pg (ref 26.0–34.0)
MCHC: 28.4 g/dL — ABNORMAL LOW (ref 30.0–36.0)
MCV: 109.4 fL — ABNORMAL HIGH (ref 80.0–100.0)
Platelets: 314 10*3/uL (ref 150–400)
RBC: 2.03 MIL/uL — ABNORMAL LOW (ref 4.22–5.81)
RDW: 19.6 % — ABNORMAL HIGH (ref 11.5–15.5)
WBC: 15.8 10*3/uL — ABNORMAL HIGH (ref 4.0–10.5)
nRBC: 0.4 % — ABNORMAL HIGH (ref 0.0–0.2)

## 2019-09-12 LAB — POC OCCULT BLOOD, ED: Fecal Occult Bld: NEGATIVE

## 2019-09-12 LAB — URINALYSIS, ROUTINE W REFLEX MICROSCOPIC
Bilirubin Urine: NEGATIVE
Glucose, UA: NEGATIVE mg/dL
Hgb urine dipstick: NEGATIVE
Ketones, ur: NEGATIVE mg/dL
Nitrite: NEGATIVE
Protein, ur: 30 mg/dL — AB
Specific Gravity, Urine: 1.016 (ref 1.005–1.030)
pH: 5 (ref 5.0–8.0)

## 2019-09-12 LAB — IRON AND TIBC
Iron: 43 ug/dL — ABNORMAL LOW (ref 45–182)
Saturation Ratios: 21 % (ref 17.9–39.5)
TIBC: 206 ug/dL — ABNORMAL LOW (ref 250–450)
UIBC: 163 ug/dL

## 2019-09-12 LAB — FOLATE: Folate: 24.7 ng/mL (ref >5.9)

## 2019-09-12 LAB — RETICULOCYTES
Immature Retic Fract: 36.1 % — ABNORMAL HIGH (ref 2.3–15.9)
RBC.: 1.95 MIL/uL — ABNORMAL LOW (ref 4.22–5.81)
Retic Count, Absolute: 164.4 10*3/uL (ref 19.0–186.0)
Retic Ct Pct: 8.4 % — ABNORMAL HIGH (ref 0.4–3.1)

## 2019-09-12 LAB — FERRITIN: Ferritin: 211 ng/mL (ref 24–336)

## 2019-09-12 MED ORDER — SODIUM CHLORIDE 0.9 % IV SOLN
2.0000 g | INTRAVENOUS | Status: DC
  Administered 2019-09-13 – 2019-09-19 (×7): 2 g via INTRAVENOUS
  Filled 2019-09-12: qty 2
  Filled 2019-09-12 (×7): qty 20

## 2019-09-12 MED ORDER — SODIUM CHLORIDE 0.9 % IV SOLN: INTRAVENOUS | Status: DC

## 2019-09-12 MED ORDER — SODIUM CHLORIDE 0.9 % IV SOLN: 10.0000 mL/h | Freq: Once | INTRAVENOUS | Status: DC

## 2019-09-12 NOTE — ED Provider Notes (Signed)
Medical screening examination/treatment/procedure(s) were conducted as a shared visit with non-physician practitioner(s) and myself.  I personally evaluated the patient during the encounter.      Results for orders placed or performed during the hospital encounter of 09/12/19  Comprehensive metabolic panel  Result Value Ref Range   Sodium 144 135 - 145 mmol/L   Potassium 3.7 3.5 - 5.1 mmol/L   Chloride 113 (H) 98 - 111 mmol/L   CO2 23 22 - 32 mmol/L   Glucose, Bld 118 (H) 70 - 99 mg/dL   BUN 53 (H) 8 - 23 mg/dL   Creatinine, Ser 2.63 (H) 0.61 - 1.24 mg/dL   Calcium 7.9 (L) 8.9 - 10.3 mg/dL   Total Protein 5.6 (L) 6.5 - 8.1 g/dL   Albumin 2.5 (L) 3.5 - 5.0 g/dL   AST 60 (H) 15 - 41 U/L   ALT 30 0 - 44 U/L   Alkaline Phosphatase 52 38 - 126 U/L   Total Bilirubin 1.5 (H) 0.3 - 1.2 mg/dL   GFR calc non Af Amer 22 (L) >60 mL/min   GFR calc Af Amer 26 (L) >60 mL/min   Anion gap 8 5 - 15  CBC  Result Value Ref Range   WBC 15.8 (H) 4.0 - 10.5 K/uL   RBC 2.03 (L) 4.22 - 5.81 MIL/uL   Hemoglobin 6.3 (LL) 13.0 - 17.0 g/dL   HCT 22.2 (L) 39 - 52 %   MCV 109.4 (H) 80.0 - 100.0 fL   MCH 31.0 26.0 - 34.0 pg   MCHC 28.4 (L) 30.0 - 36.0 g/dL   RDW 19.6 (H) 11.5 - 15.5 %   Platelets 314 150 - 400 K/uL   nRBC 0.4 (H) 0.0 - 0.2 %  Protime-INR  Result Value Ref Range   Prothrombin Time 14.4 11.4 - 15.2 seconds   INR 1.2 0.8 - 1.2  Vitamin B12  Result Value Ref Range   Vitamin B-12 553 180 - 914 pg/mL  Folate  Result Value Ref Range   Folate 24.7 >5.9 ng/mL  Iron and TIBC  Result Value Ref Range   Iron 43 (L) 45 - 182 ug/dL   TIBC 206 (L) 250 - 450 ug/dL   Saturation Ratios 21 17.9 - 39.5 %   UIBC 163 ug/dL  Ferritin  Result Value Ref Range   Ferritin 211 24 - 336 ng/mL  Reticulocytes  Result Value Ref Range   Retic Ct Pct 8.4 (H) 0.4 - 3.1 %   RBC. 1.95 (L) 4.22 - 5.81 MIL/uL   Retic Count, Absolute 164.4 19.0 - 186.0 K/uL   Immature Retic Fract 36.1 (H) 2.3 - 15.9 %   Urinalysis, Routine w reflex microscopic  Result Value Ref Range   Color, Urine YELLOW YELLOW   APPearance CLOUDY (A) CLEAR   Specific Gravity, Urine 1.016 1.005 - 1.030   pH 5.0 5.0 - 8.0   Glucose, UA NEGATIVE NEGATIVE mg/dL   Hgb urine dipstick NEGATIVE NEGATIVE   Bilirubin Urine NEGATIVE NEGATIVE   Ketones, ur NEGATIVE NEGATIVE mg/dL   Protein, ur 30 (A) NEGATIVE mg/dL   Nitrite NEGATIVE NEGATIVE   Leukocytes,Ua MODERATE (A) NEGATIVE   RBC / HPF 0-5 0 - 5 RBC/hpf   WBC, UA 6-10 0 - 5 WBC/hpf   Bacteria, UA RARE (A) NONE SEEN   Squamous Epithelial / LPF 0-5 0 - 5   Mucus PRESENT    Hyaline Casts, UA PRESENT    Granular Casts, UA PRESENT  Amorphous Crystal PRESENT   POC occult blood, ED  Result Value Ref Range   Fecal Occult Bld NEGATIVE NEGATIVE  Type and screen [place]  Result Value Ref Range   ABO/RH(D) A NEG    Antibody Screen NEG    Sample Expiration 09/15/2019,2359    Unit Number [id number]    Blood Component Type RED CELLS,LR    Unit division 00    Status of Unit ISSUED    Transfusion Status OK TO TRANSFUSE    Crossmatch Result      Compatible Performed at [facility], [place] [address], [place], [place] [address]    Unit Number [id number]    Blood Component Type RBC LR PHER2    Unit division 00    Status of Unit ALLOCATED    Transfusion Status OK TO TRANSFUSE    Crossmatch Result Compatible   Prepare RBC (crossmatch)  Result Value Ref Range   Order Confirmation      ORDER PROCESSED BY BLOOD BANK Performed at [facility], [address], [place], [place] [address]   [facility]  Result Value Ref Range   ISSUE DATE / TIME 030092330076    Blood Product Unit Number [id number]    PRODUCT CODE L8937D42    Unit Type and Rh 0600    Blood Product Expiration Date 876811572620    Blood Product Unit Number [id number]    PRODUCT CODE M4680H21    Unit Type and Rh 0600    Blood Product Expiration Date 224825003704       Patient seen by me along with physician assistant.  Patient sent in for low hemoglobin.  Patient has been getting home blood checks.  Was supposedly down to 5.9.  Patient had surgery on his left knee last week in [place] due to septic knee replacement.  Patient is on IV antibiotics.  He had a transfusion a week ago.  Received 2 units of blood.  They have been checking them since.  Initially they thought that blood count went down due to postoperative changes.  Patient states that he has been feeling kind of tired.  His local orthopedic doctor is Dr. Due to.  He referred him to [facility] due to the complication.  Blood counts here consistent with anemia.  Hemoglobin was 6.3.  Patient will receive 2 units of blood.  Transfusion ordered.  Hospitalist service will admit.  Critical care time due to the blood transfusion.  CRITICAL CARE Performed by: Fredia Sorrow Total critical care time: 35 minutes Critical care time was exclusive of separately billable procedures and treating other patients. Critical care was necessary to treat or prevent imminent or life-threatening deterioration. Critical care was time spent personally by me on the following activities: development of treatment plan with patient and/or surrogate as well as nursing, discussions with consultants, evaluation of patient's response to treatment, examination of patient, obtaining history from patient or surrogate, ordering and performing treatments and interventions, ordering and review of laboratory studies, ordering and review of radiographic studies, pulse oximetry and re-evaluation of patient's condition.    Fredia Sorrow, MD 09/12/19 [phone]

## 2019-09-12 NOTE — Telephone Encounter (Signed)
Luis Herrera from [facility] called to report patients HGB of 5.4 from 7/26. She states that Infectious Disease was notified, but patient wished to also make Dr. Harrington Challenger aware. Alyse Low advised that patient was there getting IV antibiotics but he is home now.

## 2019-09-12 NOTE — ED Provider Notes (Signed)
[facility] EMERGENCY DEPARTMENT Provider Note   CSN: [id number] Arrival date & time: 09/12/19  1147     History Chief Complaint  Patient presents with  . Abnormal Lab    Delan Ksiazek is a 79 y.o. male with past medical history of hypertension, PVC, CAD, hyperlipidemia, who presents today for evaluation of anemia.  He was recently seen at [facility] for a infected left prosthetic knee joint.  While there he had a transfusion about 1 week ago according to patient and his wife and was discharged home with home health.  [facility] reported that he was anemic based on low hemoglobin obtained on blood work and recommended that he come to the emergency room.  Attempted to obtain outside records from [facility], however at this time they are not viewable through [place].  He reports that he is not having any dark tarry sticky bowel movements.  Unsure why he is anemic.  He has a PICC line in place and reports that he is on 2 different antibiotics which is being managed by home health.  No fevers.  He does report recent worsening fatigue which he attributes to the anemia.  HPI     Past Medical History:  Diagnosis Date  . Aortic stenosis 2008  . Arthritis   . Blood transfusion   . Blood transfusion without reported diagnosis   . CAD (coronary artery disease) 2008  . Cataract   . Complication of anesthesia    hallucinated once  . H/O hiatal hernia   . 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 Hx of colonic polyps   . Hyperlipidemia   . Hypertension   . Thyroid disease     Patient Active Problem List   Diagnosis Date Noted  . Symptomatic anemia 09/12/2019  . AKI (acute kidney injury) ([place]) 09/12/2019  . Anemia 09/12/2019  . Hypertensive urgency 04/13/2018  . Chest pain 04/13/2018  . UTI (urinary tract infection) 04/13/2018  . Hypothyroidism 04/13/2018  . Aortic valve stenosis 09/27/2017  . Right bundle branch block 09/27/2017    . CAD (coronary artery disease) 09/27/2017  . 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 Acquired claw toe of right foot 06/04/2016  . Class 1 obesity with body mass index (BMI) of 31.0 to 31.9 in adult 12/02/2011  . 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 Back pain of thoracolumbar region 09/01/2010  . Hyperlipidemia with target LDL less than 130 04/10/2010  . Essential hypertension 04/10/2010  . 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 AORTIC VALVE REPLACEMENT  2009   Bioprosthetic at [place]  2008  . CHOLECYSTECTOMY  1982  . COLONOSCOPY    . CORONARY ARTERY BYPASS GRAFT  2009   SVG-PDA w/ AVR  . CORRECTION HAMMER TOE  2011   BILATERAL  . 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 POLYPECTOMY    . TONSILLECTOMY    . TOTAL HIP ARTHROPLASTY Left 2000  . TOTAL KNEE ARTHROPLASTY  2000   left  . TOTAL KNEE ARTHROPLASTY  12/01/2011   Procedure: TOTAL KNEE ARTHROPLASTY;  Surgeon: Mauri Pole, MD;  Location: [facility];  Service: Orthopedics;  Laterality: Right;  . WEIL OSTEOTOMY  05/28/2011       Family History  Problem Relation Age of Onset  . Colon cancer Mother 86  . Heart attack Father   .  Hypertension Other   . Hyperlipidemia Other   . Heart attack Brother   . Hypertension Sister   . Hypertension Brother   . Stroke Neg Hx   . Esophageal cancer Neg Hx   . Rectal cancer Neg Hx   . Stomach cancer Neg Hx     Social History   Tobacco Use  . Smoking status: Former Smoker    Quit date: 05/25/1973    Years since quitting: 46.3  . Smokeless tobacco: Never Used  Vaping Use  . Vaping Use: Never used  Substance Use Topics  . Alcohol use: Not Currently    Comment: rare  . Drug use: No    Home Medications Prior to Admission medications   Medication Sig Start Date End Date Taking? Authorizing Provider  acetaminophen (TYLENOL) 500 MG tablet Take 1,000 mg by mouth as  needed for moderate pain.   Yes [provider]  amiodarone (PACERONE) 200 MG tablet Take 0.5 tablets (100 mg total) by mouth daily. 02/20/19  Yes Evans Lance, MD  amLODipine-olmesartan (AZOR) 5-40 MG tablet Take 1 tablet by mouth daily.   Yes [provider]  aspirin 81 MG tablet Take 81 mg by mouth in the morning and at bedtime.    Yes [provider]  atorvastatin (LIPITOR) 80 MG tablet Take 1 tablet (80 mg total) by mouth daily at 6 PM. 02/20/19  Yes Evans Lance, MD  carvedilol (COREG) 6.25 MG tablet Take 1 tablet (6.25 mg total) by mouth 2 (two) times daily. 02/20/19  Yes Evans Lance, MD  ezetimibe (ZETIA) 10 MG tablet Take 1 tablet (10 mg total) by mouth daily. Patient taking differently: Take 40 mg by mouth daily.  02/20/19  Yes Evans Lance, MD  furosemide (LASIX) 40 MG tablet TAKE 1 TABLET BY MOUTH EVERY DAY . TAKE ADDITIONAL 40MG  AS NEEDED FOR SWELLING AND OR WEIGHT Patient taking differently: Take 40 mg by mouth daily.  02/20/19  Yes Evans Lance, MD  hydrALAZINE (APRESOLINE) 25 MG tablet Take 1 tablet (25 mg total) by mouth 2 (two) times daily. 02/20/19  Yes Evans Lance, MD  levothyroxine (SYNTHROID) 50 MCG tablet Take 1 tablet (50 mcg total) by mouth daily before breakfast. 02/20/19  Yes Evans Lance, MD  Multiple Vitamin (MULTIVITAMIN WITH MINERALS) TABS Take 1 tablet by mouth daily.   Yes [provider]  olmesartan (BENICAR) 40 MG tablet Take 40 mg by mouth daily.   Yes [provider]  omeprazole (PRILOSEC) 40 MG capsule Take 40 mg by mouth daily. 09/05/19  Yes [provider]  oxyCODONE (OXY IR/ROXICODONE) 5 MG immediate release tablet Take 5 mg by mouth as needed for pain. 09/02/19  Yes [provider]  amLODipine-olmesartan (AZOR) 10-40 MG tablet Take 1 tablet by mouth daily. Patient not taking: Reported on 09/12/2019 02/20/19 02/15/20  Evans Lance, MD  ibuprofen (ADVIL,MOTRIN) 800 MG tablet TAKE 1 TABLET  BY MOUTH EVERY 8 HOURS AS NEEDED Patient not taking: Reported on 09/12/2019 03/31/18   Edrick Kins, DPM  mupirocin ointment (BACTROBAN) 2 % Apply 1 application topically 2 (two) times daily. Apply to the affected area 2 times a day Patient not taking: Reported on 09/12/2019 07/11/19   Newt Minion, MD    Allergies    Sulfamethoxazole-trimethoprim  Review of Systems   Review of Systems  Constitutional: Positive for fatigue. Negative for chills and fever.  Respiratory: Negative for chest tightness and shortness of breath.  Cardiovascular: Negative for chest pain.  Gastrointestinal: Negative for abdominal pain, blood in stool, nausea and vomiting.  Musculoskeletal: Negative for back pain and neck pain.       Improving pain in the left knee  Skin: Negative for color change, rash and wound.  Neurological: Negative for weakness and headaches.  Psychiatric/Behavioral: Negative for confusion.  All other systems reviewed and are negative.   Physical Exam Updated Vital Signs BP (!) 139/44   Pulse 77   Temp 98.6 F (37 C) (Oral)   Resp 18   Ht 5\' 11"  (1.803 m)   Wt (!) 99.8 kg   SpO2 96%   BMI 30.68 kg/m   Physical Exam Vitals and nursing note reviewed. Exam conducted with a chaperone present.  Constitutional:      General: He is not in acute distress.    Appearance: He is well-developed.  HENT:     Head: Normocephalic and atraumatic.     Mouth/Throat:     Mouth: Mucous membranes are moist.  Eyes:     Conjunctiva/sclera: Conjunctivae normal.  Cardiovascular:     Rate and Rhythm: Normal rate and regular rhythm.     Pulses: Normal pulses.     Heart sounds: No murmur heard.   Pulmonary:     Effort: Pulmonary effort is normal. No respiratory distress.     Breath sounds: Normal breath sounds.  Abdominal:     Palpations: Abdomen is soft.     Tenderness: There is no abdominal tenderness.  Genitourinary:    Comments: Guaiac negative Musculoskeletal:     Cervical back:  Normal range of motion and neck supple. No rigidity.  Skin:    General: Skin is warm and dry.     Coloration: Skin is pale.     Comments: Dressing in place over left knee, minimal redness  Neurological:     General: No focal deficit present.     Mental Status: He is alert and oriented to person, place, and time.  Psychiatric:        Mood and Affect: Mood normal.        Behavior: Behavior normal.     ED Results / Procedures / Treatments   Labs (all labs ordered are listed, but only abnormal results are displayed) Labs Reviewed  COMPREHENSIVE METABOLIC PANEL - Abnormal; Notable for the following components:      Result Value   Chloride 113 (*)    Glucose, Bld 118 (*)    BUN 53 (*)    Creatinine, Ser 2.63 (*)    Calcium 7.9 (*)    Total Protein 5.6 (*)    Albumin 2.5 (*)    AST 60 (*)    Total Bilirubin 1.5 (*)    GFR calc non Af Amer 22 (*)    GFR calc Af Amer 26 (*)    All other components within normal limits  CBC - Abnormal; Notable for the following components:   WBC 15.8 (*)    RBC 2.03 (*)    Hemoglobin 6.3 (*)    HCT 22.2 (*)    MCV 109.4 (*)    MCHC 28.4 (*)    RDW 19.6 (*)    nRBC 0.4 (*)    All other components within normal limits  IRON AND TIBC - Abnormal; Notable for the following components:   Iron 43 (*)    TIBC 206 (*)    All other components within normal limits  RETICULOCYTES - Abnormal; Notable for the following components:  Retic Ct Pct 8.4 (*)    RBC. 1.95 (*)    Immature Retic Fract 36.1 (*)    All other components within normal limits  URINALYSIS, ROUTINE W REFLEX MICROSCOPIC - Abnormal; Notable for the following components:   APPearance CLOUDY (*)    Protein, ur 30 (*)    Leukocytes,Ua MODERATE (*)    Bacteria, UA RARE (*)    All other components within normal limits  SARS CORONAVIRUS 2 BY RT PCR (HOSPITAL ORDER, [facility])  PROTIME-INR  VITAMIN B12  FOLATE  FERRITIN  POC OCCULT BLOOD, ED  TYPE AND  SCREEN  PREPARE RBC (CROSSMATCH)    EKG None  Radiology No results found.  Procedures .Critical Care Performed by: Lorin Glass, PA-C Authorized by: Lorin Glass, PA-C   Critical care provider statement:    Critical care time (minutes):  45   Critical care was time spent personally by me on the following activities:  Discussions with consultants, evaluation of patient's response to treatment, examination of patient, ordering and performing treatments and interventions, ordering and review of laboratory studies, ordering and review of radiographic studies, pulse oximetry, re-evaluation of patient's condition, obtaining history from patient or surrogate and review of old charts Comments:     Symptomatic anemia.    (including critical care time)  Medications Ordered in ED Medications  0.9 %  sodium chloride infusion (has no administration in time range)  0.9 %  sodium chloride infusion (has no administration in time range)  cefTRIAXone (ROCEPHIN) 2 g in sodium chloride 0.9 % 100 mL IVPB (has no administration in time range)    ED Course  I have reviewed the triage vital signs and the nursing notes.  Pertinent labs & imaging results that were available during my care of the patient were reviewed by me and considered in my medical decision making (see chart for details).  Clinical Course as of Sep 12 2318  Tue Sep 12, 2019  1906 I spoke with hospitalist who will admit patient.    [EH]    Clinical Course User Index [EH] Ollen Gross   MDM Rules/Calculators/A&P                          Patient is a 79 year old man who presents today for evaluation of concerns of anemia.  He had outside labs obtained and was found to be anemic through home health.  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  He has been home with home health.    Hemoccult is  negative.  Anemia panel is ordered.  Given that he is symptomatic with fatigue and anemic he accepts a blood transfusion after we discussed, risks benefits and purpose.  Here he is anemic with a hemoglobin of 6.3.  His labs also show an elevated creatinine of 2.63 which is elevated from his prior in the system.  Unable to see his more recent labs due to issues with care everywhere at this time.  I spoke with hospitalist who will see patient for admission.   Final Clinical Impression(s) / ED Diagnoses Final diagnoses:  Symptomatic anemia    Rx / DC Orders ED Discharge Orders    None       Ollen Gross 09/12/19 2323    Fredia Sorrow, MD 09/15/19 [phone]

## 2019-09-12 NOTE — ED Notes (Signed)
Pt stated that he is unable to stand for orthostatic vitals due to pain in his L knee

## 2019-09-12 NOTE — Telephone Encounter (Signed)
Will forward to Dr. Harrington Challenger and her nurse.

## 2019-09-12 NOTE — H&P (Addendum)
PCP:   [name], MD  Cardiologist: Dr [name]  Chief Complaint:    HPI: This is a 79 year old male who was sent to the ER after outpatient labs done yesterday revealed a hemoglobin of 5.5. Today in the ER his hemoglobin is 6.3.  per visiting home nursing, the initial hemoglobin they collected 1 week ago was 11.5.  His creatinine today in the ER is 2.63.  Yesterday's creatinine was 1.2.  [place] initial creatinine was 0.97.  He was recently hospitalized in [place] with infection of the left knee.  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  He states he had a washout of the knee and the disc was replaced.  The cultures were negative, therefore, he was discharged with a PICC line on broad-spectrum antibiotics Zosyn and vancomycin.  He was discharged with aspirin 81 mg p.o. twice daily which is an increased dosage from once daily.  He was on meloxicam which was DC'd and he was started on oxycodone.  The patient reports no evidence of bleeding.  Rhe reports brown stools.  Reports some mild nausea last night but no vomiting.  No abdominal pain, no epigastric pain.  While was hospitalized in [place] he was transfused 2 units PRBC.  He has no history of GI bleed or stomach ulcers.  His last colonoscopy was routine done in 2020 which showed diffuse diverticulosis.  He reports a dry mouth since [facility] which is now worse.  His dry mouth woke him from sleep last night.  He denies any use of NSAIDs  He has been short of breath and lightheaded and dizzy since his surgery.  He denies any chest pain.  I was unable to review patient's recent hospitalization or [facility] notes as EPIC is still not fully available   Review of Systems:  The patient denies anorexia, fever, weight loss,, vision loss, decreased hearing, hoarseness, chest pain, syncope, dyspnea on exertion, peripheral edema, balance deficits, hemoptysis, abdominal pain, melena, hematochezia,  severe indigestion/heartburn, hematuria, incontinence, genital sores, muscle weakness, suspicious skin lesions, transient blindness, difficulty walking, depression, unusual weight change, abnormal bleeding, enlarged lymph nodes, angioedema, and breast masses.   Positive for: Left knee swelling, shortness of breath, lightheadedness, dizziness  Past Medical History: Past Medical History:  Diagnosis Date  . Aortic stenosis 2008  . Arthritis   . Blood transfusion   . Blood transfusion without reported diagnosis   . CAD (coronary artery disease) 2008  . Cataract   . Complication of anesthesia    hallucinated once  . H/O hiatal hernia   . 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 Hx of colonic polyps   . Hyperlipidemia   . Hypertension   . Thyroid disease    Past Surgical History:  Procedure Laterality Date  . AORTIC VALVE REPLACEMENT  2009   Bioprosthetic at [place]  2008  . CHOLECYSTECTOMY  1982  . COLONOSCOPY    . CORONARY ARTERY BYPASS GRAFT  2009   SVG-PDA w/ AVR  . CORRECTION HAMMER TOE  2011   BILATERAL  . 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 POLYPECTOMY    . TONSILLECTOMY    . TOTAL HIP ARTHROPLASTY Left 2000  . TOTAL KNEE ARTHROPLASTY  2000   left  . TOTAL KNEE ARTHROPLASTY  12/01/2011   Procedure: TOTAL KNEE ARTHROPLASTY;  Surgeon: Mauri Pole, MD;  Location: [facility];  Service: Orthopedics;  Laterality: Right;  . WEIL OSTEOTOMY  05/28/2011    Medications: Prior to Admission medications   Medication Sig Start Date End Date Taking? Authorizing Provider  amiodarone (PACERONE) 200 MG tablet Take 0.5 tablets (100 mg total) by mouth daily. 02/20/19   Evans Lance, MD  amLODipine-olmesartan (AZOR) 10-40 MG tablet Take 1 tablet by mouth daily. 02/20/19 02/15/20  Evans Lance, MD  aspirin 81 MG tablet Take 81 mg by mouth at bedtime.     [provider]  atorvastatin (LIPITOR) 80 MG tablet  Take 1 tablet (80 mg total) by mouth daily at 6 PM. 02/20/19   Evans Lance, MD  carvedilol (COREG) 6.25 MG tablet Take 1 tablet (6.25 mg total) by mouth 2 (two) times daily. 02/20/19   Evans Lance, MD  ezetimibe (ZETIA) 10 MG tablet Take 1 tablet (10 mg total) by mouth daily. 02/20/19   Evans Lance, MD  furosemide (LASIX) 40 MG tablet TAKE 1 TABLET BY MOUTH EVERY DAY . TAKE ADDITIONAL 40MG  AS NEEDED FOR SWELLING AND OR WEIGHT 02/20/19   Evans Lance, MD  hydrALAZINE (APRESOLINE) 25 MG tablet Take 1 tablet (25 mg total) by mouth 2 (two) times daily. 02/20/19   Evans Lance, MD  ibuprofen (ADVIL,MOTRIN) 800 MG tablet TAKE 1 TABLET BY MOUTH EVERY 8 HOURS AS NEEDED Patient taking differently: Take 800 mg by mouth every 8 (eight) hours as needed for mild pain.  03/31/18   Edrick Kins, DPM  levothyroxine (SYNTHROID) 50 MCG tablet Take 1 tablet (50 mcg total) by mouth daily before breakfast. 02/20/19   Evans Lance, MD  Multiple Vitamin (MULTIVITAMIN WITH MINERALS) TABS Take 1 tablet by mouth daily.    [provider]  mupirocin ointment (BACTROBAN) 2 % Apply 1 application topically 2 (two) times daily. Apply to the affected area 2 times a day 07/11/19   Newt Minion, MD    Allergies:   Allergies  Allergen Reactions  . Sulfamethoxazole-Trimethoprim Other (See Comments)    Carlyn Reichert Syndrome    Social History:  reports that he quit smoking about 46 years ago. He has never used smokeless tobacco. He reports previous alcohol use. He reports that he does not use drugs.  Family History: Family History  Problem Relation Age of Onset  . Colon cancer Mother 33  . Heart attack Father   . Hypertension Other   . Hyperlipidemia Other   . Heart attack Brother   . Hypertension Sister   . Hypertension Brother   . Stroke Neg Hx   . Esophageal cancer Neg Hx   . Rectal cancer Neg Hx   . Stomach cancer Neg Hx     Physical Exam: Vitals:   09/12/19 1630 09/12/19 1709 09/12/19  1751 09/12/19 1752  BP: (!) 100/48 (!) 138/101 (!) 134/61   Pulse: 65 82 69   Resp: 19 19 20    Temp:  98.3 F (36.8 C) 98.5 F (36.9 C)   TempSrc:  Oral Oral   SpO2: 95% 96% 98%   Weight:    (!) 99.8 kg  Height:    5\' 11"  (1.803 m)    General:  Alert and oriented times three, well developed and nourished, no acute distress Eyes: PERRLA, pink conjunctiva, no scleral icterus ENT: Moist oral mucosa, neck supple, no thyromegaly Lungs: clear to ascultation, no wheeze, no crackles, no use of accessory muscles Cardiovascular: regular rate and rhythm, no regurgitation, no gallops, murmurs. No  carotid bruits, no JVD Abdomen: soft, positive BS, non-tender, non-distended, no organomegaly, not an acute abdomen GU: not examined Neuro: CN II - XII grossly intact, sensation intact Musculoskeletal: strength 5/5 all extremities, no clubbing, cyanosis or edema Skin: no rash, no subcutaneous crepitation, no decubitus Psych: appropriate patient PICCm line left UE Left Knee/leg: swollen. With dressing and erythema inner thigh. Some bruising on the posterior side 9it was worse but mostly resolved now   Labs on Admission:  Recent Labs    09/12/19 1240  NA 144  K 3.7  CL 113*  CO2 23  GLUCOSE 118*  BUN 53*  CREATININE 2.63*  CALCIUM 7.9*   Recent Labs    09/12/19 1240  AST 60*  ALT 30  ALKPHOS 52  BILITOT 1.5*  PROT 5.6*  ALBUMIN 2.5*   No results for input(s): LIPASE, AMYLASE in the last 72 hours. Recent Labs    09/12/19 1240  WBC 15.8*  HGB 6.3*  HCT 22.2*  MCV 109.4*  PLT 314   No results for input(s): CKTOTAL, CKMB, CKMBINDEX, TROPONINI in the last 72 hours. Invalid input(s): POCBNP No results for input(s): DDIMER in the last 72 hours. No results for input(s): HGBA1C in the last 72 hours. No results for input(s): CHOL, HDL, LDLCALC, TRIG, CHOLHDL, LDLDIRECT in the last 72 hours. No results for input(s): TSH, T4TOTAL, T3FREE, THYROIDAB in the last 72 hours.  Invalid  input(s): FREET3 Recent Labs    09/12/19 1637 09/12/19 1707  VITAMINB12  --  553  FOLATE 24.7  --   FERRITIN  --  211  TIBC  --  206*  IRON  --  43*  RETICCTPCT 8.4*  --     Micro Results: No results found for this or any previous visit (from the past 240 hour(s)).   Radiological Exams on Admission: No results found.  Assessment/Plan Present on Admission: . Symptomatic anemia -Admit to MedSurg -Unclear etiology for patient's anemia -Left lower extremity ultrasound ordered, rule out DVT and/or hematoma -Transfusion of 2 units PRBC ordered in ER, posttransfusion H&H -Serial H&H, IV Protonix but no evidence of GI bleeding -Anemia panel panel ordered from the ER -Patient is on baby ASA, increased to twice daily at his last hospitalization 1 week ago.  Patient does not report any GI complaints -Defer to a.m. team necessary consult -N.p.o. at midnight  . AKI (acute kidney injury) ([place]) -IV fluid hydration -Strict I/Os, -Vancomycin held, defer to a.m. team ID consult for alternative antibiotic -Renal ultrasound ordered -She is on Lasix, this is held  Recent surgery left knee/debridement/disc replacement -On IV antibiotics Rocephin.  Vancomycin held.  Defer to a.m. team to consult ID for alternate antibiotic. -Wound care consult placed -Defer to a.m. team orthopedics consult -Doppler left lower extremity rule out DVT/hematoma  . 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 Essential hypertension -stable, home meds resumed  . Hyperlipidemia with target LDL less than 130 -Stable, home meds resumed  . Hypothyroidism -Stable, home meds resumed  Elis Rawlinson 09/12/2019, 7:25 PM

## 2019-09-12 NOTE — ED Triage Notes (Signed)
Pt arrives to ED with c/ of low hemoglobin- pt was told it was down to 5.9. Pt states he had a surgery on his left knee last week in [place] and had to have 2 transfusions a week ago today.  Pt has repeat blood work done yesterday which showed it was back down, pt states he has been feeling tired.

## 2019-09-13 ENCOUNTER — Inpatient Hospital Stay (HOSPITAL_COMMUNITY): Payer: Medicare Other

## 2019-09-13 ENCOUNTER — Other Ambulatory Visit (HOSPITAL_COMMUNITY): Payer: Medicare Other

## 2019-09-13 ENCOUNTER — Encounter (HOSPITAL_COMMUNITY): Payer: Self-pay | Admitting: Family Medicine

## 2019-09-13 ENCOUNTER — Other Ambulatory Visit: Payer: Self-pay

## 2019-09-13 DIAGNOSIS — I350 Nonrheumatic aortic (valve) stenosis: Secondary | ICD-10-CM | POA: Diagnosis not present

## 2019-09-13 DIAGNOSIS — I340 Nonrheumatic mitral (valve) insufficiency: Secondary | ICD-10-CM | POA: Diagnosis not present

## 2019-09-13 DIAGNOSIS — I351 Nonrheumatic aortic (valve) insufficiency: Secondary | ICD-10-CM

## 2019-09-13 DIAGNOSIS — M7989 Other specified soft tissue disorders: Secondary | ICD-10-CM

## 2019-09-13 DIAGNOSIS — L8990 Pressure ulcer of unspecified site, unspecified stage: Secondary | ICD-10-CM | POA: Insufficient documentation

## 2019-09-13 LAB — COMPREHENSIVE METABOLIC PANEL
ALT: 26 U/L (ref 0–44)
AST: 47 U/L — ABNORMAL HIGH (ref 15–41)
Albumin: 2.4 g/dL — ABNORMAL LOW (ref 3.5–5.0)
Alkaline Phosphatase: 50 U/L (ref 38–126)
Anion gap: 11 (ref 5–15)
BUN: 56 mg/dL — ABNORMAL HIGH (ref 8–23)
CO2: 21 mmol/L — ABNORMAL LOW (ref 22–32)
Calcium: 7.7 mg/dL — ABNORMAL LOW (ref 8.9–10.3)
Chloride: 113 mmol/L — ABNORMAL HIGH (ref 98–111)
Creatinine, Ser: 2.9 mg/dL — ABNORMAL HIGH (ref 0.61–1.24)
GFR calc Af Amer: 23 mL/min — ABNORMAL LOW (ref >60)
GFR calc non Af Amer: 20 mL/min — ABNORMAL LOW (ref >60)
Glucose, Bld: 90 mg/dL (ref 70–99)
Potassium: 3.4 mmol/L — ABNORMAL LOW (ref 3.5–5.1)
Sodium: 145 mmol/L (ref 135–145)
Total Bilirubin: 1.7 mg/dL — ABNORMAL HIGH (ref 0.3–1.2)
Total Protein: 5.2 g/dL — ABNORMAL LOW (ref 6.5–8.1)

## 2019-09-13 LAB — RETICULOCYTES
Immature Retic Fract: 35.1 % — ABNORMAL HIGH (ref 2.3–15.9)
RBC.: 2.46 MIL/uL — ABNORMAL LOW (ref 4.22–5.81)
Retic Count, Absolute: 150.8 10*3/uL (ref 19.0–186.0)
Retic Ct Pct: 6.1 % — ABNORMAL HIGH (ref 0.4–3.1)

## 2019-09-13 LAB — HEMOGLOBIN AND HEMATOCRIT, BLOOD
HCT: 23.1 % — ABNORMAL LOW (ref 39.0–52.0)
HCT: 24.8 % — ABNORMAL LOW (ref 39.0–52.0)
Hemoglobin: 7.1 g/dL — ABNORMAL LOW (ref 13.0–17.0)
Hemoglobin: 7.8 g/dL — ABNORMAL LOW (ref 13.0–17.0)

## 2019-09-13 LAB — LACTATE DEHYDROGENASE: LDH: 486 U/L — ABNORMAL HIGH (ref 98–192)

## 2019-09-13 MED ORDER — ACETAMINOPHEN 325 MG PO TABS: 650.0000 mg | ORAL_TABLET | Freq: Four times a day (QID) | ORAL | Status: DC | PRN

## 2019-09-13 MED ORDER — ADULT MULTIVITAMIN W/MINERALS CH
1.0000 | ORAL_TABLET | Freq: Every day | ORAL | Status: DC
  Administered 2019-09-13 – 2019-09-19 (×7): 1 via ORAL
  Filled 2019-09-13 (×7): qty 1

## 2019-09-13 MED ORDER — AMIODARONE HCL 100 MG PO TABS
100.0000 mg | ORAL_TABLET | Freq: Every day | ORAL | Status: DC
  Administered 2019-09-13 – 2019-09-19 (×7): 100 mg via ORAL
  Filled 2019-09-13 (×7): qty 1

## 2019-09-13 MED ORDER — IRBESARTAN 300 MG PO TABS
300.0000 mg | ORAL_TABLET | Freq: Every day | ORAL | Status: DC
  Administered 2019-09-13: 300 mg via ORAL
  Filled 2019-09-13: qty 1

## 2019-09-13 MED ORDER — SODIUM CHLORIDE 0.9 % IV SOLN
800.0000 mg | INTRAVENOUS | Status: DC
  Filled 2019-09-13: qty 16

## 2019-09-13 MED ORDER — SODIUM CHLORIDE 0.9 % IV SOLN
800.0000 mg | Freq: Once | INTRAVENOUS | Status: AC
  Administered 2019-09-14: 800 mg via INTRAVENOUS
  Filled 2019-09-13: qty 16

## 2019-09-13 MED ORDER — SODIUM CHLORIDE 0.9% FLUSH
10.0000 mL | Freq: Two times a day (BID) | INTRAVENOUS | Status: DC
  Administered 2019-09-13 – 2019-09-19 (×12): 10 mL

## 2019-09-13 MED ORDER — ACETAMINOPHEN 650 MG RE SUPP: 650.0000 mg | Freq: Four times a day (QID) | RECTAL | Status: DC | PRN

## 2019-09-13 MED ORDER — ONDANSETRON HCL 4 MG PO TABS: 4.0000 mg | ORAL_TABLET | Freq: Four times a day (QID) | ORAL | Status: DC | PRN

## 2019-09-13 MED ORDER — CARVEDILOL 6.25 MG PO TABS
6.2500 mg | ORAL_TABLET | Freq: Two times a day (BID) | ORAL | Status: DC
  Administered 2019-09-13 – 2019-09-19 (×12): 6.25 mg via ORAL
  Filled 2019-09-13 (×13): qty 1

## 2019-09-13 MED ORDER — ONDANSETRON HCL 4 MG/2ML IJ SOLN: 4.0000 mg | Freq: Four times a day (QID) | INTRAMUSCULAR | Status: DC | PRN

## 2019-09-13 MED ORDER — AMLODIPINE-OLMESARTAN 10-40 MG PO TABS: 1.0000 | ORAL_TABLET | Freq: Every day | ORAL | Status: DC

## 2019-09-13 MED ORDER — EZETIMIBE 10 MG PO TABS
10.0000 mg | ORAL_TABLET | Freq: Every day | ORAL | Status: DC
  Administered 2019-09-13 – 2019-09-19 (×7): 10 mg via ORAL
  Filled 2019-09-13 (×7): qty 1

## 2019-09-13 MED ORDER — SODIUM CHLORIDE 0.9% FLUSH
10.0000 mL | INTRAVENOUS | Status: DC | PRN
  Administered 2019-09-19: 10 mL

## 2019-09-13 MED ORDER — CHLORHEXIDINE GLUCONATE CLOTH 2 % EX PADS
6.0000 | MEDICATED_PAD | Freq: Every day | CUTANEOUS | Status: DC
  Administered 2019-09-13 – 2019-09-19 (×7): 6 via TOPICAL

## 2019-09-13 MED ORDER — AMLODIPINE BESYLATE 10 MG PO TABS
10.0000 mg | ORAL_TABLET | Freq: Every day | ORAL | Status: DC
  Administered 2019-09-13 – 2019-09-19 (×7): 10 mg via ORAL
  Filled 2019-09-13 (×7): qty 1

## 2019-09-13 MED ORDER — ATORVASTATIN CALCIUM 80 MG PO TABS
80.0000 mg | ORAL_TABLET | Freq: Every day | ORAL | Status: DC
  Administered 2019-09-13: 80 mg via ORAL
  Filled 2019-09-13: qty 1

## 2019-09-13 MED ORDER — HYDRALAZINE HCL 25 MG PO TABS
25.0000 mg | ORAL_TABLET | Freq: Two times a day (BID) | ORAL | Status: DC
  Administered 2019-09-13 – 2019-09-19 (×12): 25 mg via ORAL
  Filled 2019-09-13 (×13): qty 1

## 2019-09-13 MED ORDER — PANTOPRAZOLE SODIUM 40 MG IV SOLR
40.0000 mg | INTRAVENOUS | Status: DC
  Administered 2019-09-13 – 2019-09-19 (×7): 40 mg via INTRAVENOUS
  Filled 2019-09-13 (×7): qty 40

## 2019-09-13 MED ORDER — LEVOTHYROXINE SODIUM 50 MCG PO TABS
50.0000 ug | ORAL_TABLET | Freq: Every day | ORAL | Status: DC
  Administered 2019-09-13 – 2019-09-19 (×7): 50 ug via ORAL
  Filled 2019-09-13 (×7): qty 1

## 2019-09-13 NOTE — Progress Notes (Signed)
  Echocardiogram 2D Echocardiogram has been performed.  Luis Herrera 09/13/2019, 4:43 PM

## 2019-09-13 NOTE — Progress Notes (Signed)
PT Cancellation Note  Patient Details Name: Luis Herrera MRN: [id number] DOB: 05-Nov-1940   Cancelled Treatment:    Reason Eval/Treat Not Completed: Patient at procedure or test/unavailable (bedside imaging). PT will continue to f/u with pt acutely as available.    Clearnce Sorrel [place] Spees 09/13/2019, 11:04 AM

## 2019-09-13 NOTE — Progress Notes (Addendum)
Progress Note    Luis Herrera  WSF:[id number] DOB: Apr 13, 1940  DOA: 09/12/2019 PCP: [name], MD    Brief Narrative:     Medical records reviewed and are as summarized below:  Luis Herrera is an 79 y.o. male who was sent to the ER after outpatient labs done yesterday revealed a hemoglobin of 5.5. Today in the ER his hemoglobin is 6.3.  per visiting home nursing, the initial hemoglobin they collected 1 week ago was 11.5.  His creatinine today in the ER is 2.63.  Yesterday's creatinine was 1.2.  [place] initial creatinine was 0.97.  He was recently hospitalized in [place] with infection of the left knee.  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  He states he had a washout of the knee and the disc was replaced.  The cultures were negative, therefore, he was discharged with a PICC line on broad-spectrum antibiotics Zosyn and vancomycin.  He was discharged with aspirin 81 mg p.o. twice daily which is an increased dosage from once daily.  He was on meloxicam which was DC'd and he was started on oxycodone.  RECORDS FROM [facility] REQUESTED  Addendum: records received and reviewed: Labs: 7/16: 11.5  7/17: hgb: 8.9  7/19am 5.8/6.0 (got 2 units PRBC)-->  7.7     7/20 7.7  Cr: 7/17: 0.98   7/19: 1.35  7/20: 1.07 Final report from Microbiology: No growth (7/22)  Assessment/Plan:   Principal Problem:   Symptomatic anemia Active Problems:   Hyperlipidemia with target LDL less than 130   Essential hypertension   Aortic valve replaced   CAD (coronary artery disease)   Hypothyroidism   AKI (acute kidney injury) (HCC)   Anemia   Pressure injury of skin   Symptomatic Anemia ? ABLA after surgery and now ? Hematoma in thigh vs hemolysis (labs ordered and pending: retic/LDH/haptoglobin plus h/h) -patient is s/p 4 units since surgery (2 with [facility] and 2 here) -heme negative U/S shows complex collection of unknown substance: Appearance of complex collection  of unknown etiology in mid-thigh  measuring 5.3 x 3.8 x 5.3 cm.   AKI -renal u/s unrevealing -stopped IVF due to   Recent left knee debridement with disc replacement -was sent home with IV Abx and PICC Line -U/s with above results -left thigh: 70; right 54 -have requested records from recent hospitalization-- family did not think he was bacteremic-- see above -recommended 6 weeks IV abx (cultures with no growth) -will get ID consult  CAD (coronary artery disease)/aortic valve replacement/frequent PVCs -Stable, home meds resumed except aspirin for now -Continue amiodarone, treatment for patient's frequent PVCs -r/o hemolysis (less likely) -limited echo  Essential hypertension -stable, home meds resumed  Hyperlipidemia with target LDL less than 130 -Stable, home meds resumed   Hypothyroidism -Stable, home meds resumed  obesity Body mass index is 30.68 kg/m.  Pressure Injury 09/13/19 Stage 1 -  Intact skin with non-blanchable redness of a localized area usually over a bony prominence. (Active)  09/13/19 1248  Location:   Location Orientation:   Staging: Stage 1 -  Intact skin with non-blanchable redness of a localized area usually over a bony prominence.  Wound Description (Comments):   Present on Admission: Yes       Family Communication/Anticipated D/C date and plan/Code Status   DVT prophylaxis: scd Code Status: Full Code.  Family Communication: wife at beside Disposition Plan: Status is: Inpatient  Remains inpatient appropriate because:Hemodynamically unstable   Dispo:  The patient is from: Home              Anticipated d/c is to: Home              Anticipated d/c date is: > 3 days              Patient currently is not medically stable to d/c.         Medical Consultants:    ortho     Subjective:   C/o worsening SOB  Objective:    Vitals:   09/13/19 0130 09/13/19 0221 09/13/19 0830 09/13/19 1235  BP: (!) 131/53 (!) 155/51 (!)  141/58 (!) 123/42  Pulse: 76 80 70 64  Resp: 22 16  16   Temp:  98.4 F (36.9 C)  98.1 F (36.7 C)  TempSrc:  Oral  Oral  SpO2: 94% 95%  94%  Weight:      Height:        Intake/Output Summary (Last 24 hours) at 09/13/2019 1442 Last data filed at 09/13/2019 1152 Gross per 24 hour  Intake 1569 ml  Output 350 ml  Net 1219 ml   Filed Weights   09/12/19 1752  Weight: (!) 99.8 kg    Exam:  General: Appearance:    Obese male in no acute distress     Lungs:    diminished breath sounds  Heart:    Normal heart rate. Normal rhythm.  Slight murmur in right upper chest  MS:   All extremities are intact- left thigh swollen and tight on left outer area  Neurologic:   Awake, alert, oriented x 3. No apparent focal neurological           defect.     Data Reviewed:   I have personally reviewed following labs and imaging studies:  Labs: Labs show the following:   Basic Metabolic Panel: Recent Labs  Lab 09/12/19 1240 09/13/19 0808  NA 144 145  K 3.7 3.4*  CL 113* 113*  CO2 23 21*  GLUCOSE 118* 90  BUN 53* 56*  CREATININE 2.63* 2.90*  CALCIUM 7.9* 7.7*   GFR Estimated Creatinine Clearance: 25.3 mL/min (A) (by C-G formula based on SCr of 2.9 mg/dL (H)). Liver Function Tests: Recent Labs  Lab 09/12/19 1240 09/13/19 0808  AST 60* 47*  ALT 30 26  ALKPHOS 52 50  BILITOT 1.5* 1.7*  PROT 5.6* 5.2*  ALBUMIN 2.5* 2.4*   No results for input(s): LIPASE, AMYLASE in the last 168 hours. No results for input(s): AMMONIA in the last 168 hours. Coagulation profile Recent Labs  Lab 09/12/19 1707  INR 1.2    CBC: Recent Labs  Lab 09/12/19 1240 09/13/19 0808  WBC 15.8*  --   HGB 6.3* 7.1*  HCT 22.2* 23.1*  MCV 109.4*  --   PLT 314  --    Cardiac Enzymes: No results for input(s): CKTOTAL, CKMB, CKMBINDEX, TROPONINI in the last 168 hours. BNP (last 3 results) Recent Labs    12/13/18 0832 05/22/19 1028  PROBNP 312 333   CBG: No results for input(s): GLUCAP in the  last 168 hours. D-Dimer: No results for input(s): DDIMER in the last 72 hours. Hgb A1c: No results for input(s): HGBA1C in the last 72 hours. Lipid Profile: No results for input(s): CHOL, HDL, LDLCALC, TRIG, CHOLHDL, LDLDIRECT in the last 72 hours. Thyroid function studies: No results for input(s): TSH, T4TOTAL, T3FREE, THYROIDAB in the last 72 hours.  Invalid input(s): FREET3 Anemia work up: Recent  Labs    09/12/19 1637 09/12/19 1707  VITAMINB12  --  553  FOLATE 24.7  --   FERRITIN  --  211  TIBC  --  206*  IRON  --  43*  RETICCTPCT 8.4*  --    Sepsis Labs: Recent Labs  Lab 09/12/19 1240  WBC 15.8*    Microbiology Recent Results (from the past 240 hour(s))  SARS Coronavirus 2 by RT PCR (hospital order, performed in [facility] hospital lab) Nasopharyngeal Nasopharyngeal Swab     Status: None   Collection Time: 09/12/19  6:55 PM   Specimen: Nasopharyngeal Swab  Result Value Ref Range Status   SARS Coronavirus 2 NEGATIVE NEGATIVE Final    Comment: (NOTE) SARS-CoV-2 target nucleic acids are NOT DETECTED.  The SARS-CoV-2 RNA is generally detectable in upper and lower respiratory specimens during the acute phase of infection. The lowest concentration of SARS-CoV-2 viral copies this assay can detect is 250 copies / mL. A negative result does not preclude SARS-CoV-2 infection and should not be used as the sole basis for treatment or other patient management decisions.  A negative result may occur with improper specimen collection / handling, submission of specimen other than nasopharyngeal swab, presence of viral mutation(s) within the areas targeted by this assay, and inadequate number of viral copies (<250 copies / mL). A negative result must be combined with clinical observations, patient history, and epidemiological information.  Fact Sheet for Patients:   StrictlyIdeas.no  Fact Sheet for Healthcare  Providers: BankingDealers.co.za  This test is not yet approved or  cleared by the [place] FDA and has been authorized for detection and/or diagnosis of SARS-CoV-2 by FDA under an Emergency Use Authorization (EUA).  This EUA will remain in effect (meaning this test can be used) for the duration of the COVID-19 declaration under Section 564(b)(1) of the Act, 21 U.S.C. section 360bbb-3(b)(1), unless the authorization is terminated or revoked sooner.  Performed at [facility], [place] [address], [place], [place] [address]     Procedures and diagnostic studies:  US RENAL  Result Date: 2019-09-14 CLINICAL DATA:  Acute kidney injury EXAM: RENAL / URINARY TRACT ULTRASOUND COMPLETE COMPARISON:  None. FINDINGS: Right Kidney: Renal measurements: 11.8 x 5.7 x 3.7 cm = volume: 129.5 mL . Echogenicity and renal cortical thickness are within normal limits. No mass, perinephric fluid, or hydronephrosis visualized. No sonographically demonstrable calculus or ureterectasis. Left Kidney: Renal measurements: 11.3 x 5.3 x 3.6 cm = volume: 113.2 mL. Echogenicity and renal thickness are within normal limits. No mass, perinephric fluid, or hydronephrosis visualized. No sonographically demonstrable calculus or ureterectasis. Bladder: Appears normal for degree of bladder distention. Other: None. IMPRESSION: Study within normal limits. Electronically Signed   By: Lowella Grip III M.D.   On: September 14, 2019 06:17   DG CHEST PORT 1 VIEW  Result Date: 2019/09/14 CLINICAL DATA:  PICC placement. EXAM: PORTABLE CHEST 1 VIEW COMPARISON:  04/28/2018 FINDINGS: New left-sided PICC has its tip projecting in the lower superior vena cava. Stable changes from previous cardiac surgery. No mediastinal or hilar masses or evidence of adenopathy. Clear lungs.  No convincing pleural effusion or pneumothorax. Skeletal structures are grossly intact. IMPRESSION: 1. No acute cardiopulmonary disease. 2.  Well-positioned left-sided PICC Electronically Signed   By: Lajean Manes M.D.   On: 14-Sep-2019 09:37    Medications:   . amiodarone  100 mg Oral Daily  . amLODipine  10 mg Oral Daily  . atorvastatin  80 mg Oral q1800  .  carvedilol  6.25 mg Oral BID  . Chlorhexidine Gluconate Cloth  6 each Topical Daily  . ezetimibe  10 mg Oral Daily  . hydrALAZINE  25 mg Oral BID  . irbesartan  300 mg Oral Daily  . pantoprazole (PROTONIX) IV  40 mg Intravenous Q24H  . sodium chloride flush  10-40 mL Intracatheter Q12H   Continuous Infusions: . sodium chloride    . cefTRIAXone (ROCEPHIN)  IV 2 g (09/13/19 0824)     LOS: 1 day   Geradine Girt  [facility]   How to contact the [facility] Attending or Consulting provider [place] or covering provider during after hours [place], for this patient?  1. Check the care team in [facility] and look for a) attending/consulting [facility] provider listed and b) the [facility] team listed 2. Log into [email or web address] and use 's universal password to access. If you do not have the password, please contact the hospital operator. 3. Locate the [facility] provider you are looking for under [facility] and page to a number that you can be directly reached. 4. If you still have difficulty reaching the provider, please page the [facility] (Director on Call) for the Hospitalists listed on amion for assistance.  09/13/2019, 2:42 PM

## 2019-09-13 NOTE — Progress Notes (Signed)
Lower extremity venous LT study completed.   See Cv Proc for preliminary results.   Nile Prisk  

## 2019-09-13 NOTE — Consult Note (Signed)
Reason for Consult:Left thigh swelling Referring Physician: [name] is an 79 y.o. male.  HPI: Luis Herrera comes in after getting a hg check as OP and finding it was quite low. He was admitted yesterday and received PRBCx2. He was noted to have swollen and painful thigh and orthopedic surgery was consulted. He was diagnosed with a knee joint infection about 2 weeks ago and sent to [facility] where he underwent an open procedure that included I&D, disc exchange, and an abx trial where it sounds like he may have had abx impregnated rods inserted into his tibia/femur. He was noted to have thigh swelling after surgery and received two transfusions there before discharge but family was told it was normal after surgery. He has been able to ambulate with a RW but leg mobility is especially hard.   Past Medical History:  Diagnosis Date  . Aortic stenosis 2008  . Arthritis   . Blood transfusion   . Blood transfusion without reported diagnosis   . CAD (coronary artery disease) 2008  . Cataract   . Complication of anesthesia    hallucinated once  . H/O hiatal hernia   . 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 Hx of colonic polyps   . Hyperlipidemia   . Hypertension   . Thyroid disease     Past Surgical History:  Procedure Laterality Date  . AORTIC VALVE REPLACEMENT  2009   Bioprosthetic at [place]  2008  . CHOLECYSTECTOMY  1982  . COLONOSCOPY    . CORONARY ARTERY BYPASS GRAFT  2009   SVG-PDA w/ AVR  . CORRECTION HAMMER TOE  2011   BILATERAL  . 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 POLYPECTOMY    . TONSILLECTOMY    . TOTAL HIP ARTHROPLASTY Left 2000  . TOTAL KNEE ARTHROPLASTY  2000   left  . TOTAL KNEE ARTHROPLASTY  12/01/2011   Procedure: TOTAL KNEE ARTHROPLASTY;  Surgeon: Mauri Pole, MD;  Location: [facility];  Service: Orthopedics;  Laterality: Right;  . WEIL OSTEOTOMY  05/28/2011    Family History   Problem Relation Age of Onset  . Colon cancer Mother 51  . Heart attack Father   . Hypertension Other   . Hyperlipidemia Other   . Heart attack Brother   . Hypertension Sister   . Hypertension Brother   . Stroke Neg Hx   . Esophageal cancer Neg Hx   . Rectal cancer Neg Hx   . Stomach cancer Neg Hx     Social History:  reports that he quit smoking about 46 years ago. He has never used smokeless tobacco. He reports previous alcohol use. He reports that he does not use drugs.  Allergies:  Allergies  Allergen Reactions  . Sulfamethoxazole-Trimethoprim Other (See Comments)    Carlyn Reichert Syndrome    Medications: I have reviewed the patient's current medications.  Results for orders placed or performed during the hospital encounter of 09/12/19 (from the past 48 hour(s))  Comprehensive metabolic panel     Status: Abnormal   Collection Time: 09/12/19 12:40 PM  Result Value Ref Range   Sodium 144 135 - 145 mmol/L   Potassium 3.7 3.5 - 5.1 mmol/L   Chloride 113 (H) 98 - 111 mmol/L   CO2 23 22 - 32 mmol/L   Glucose, Bld 118 (H) 70 - 99 mg/dL    Comment: Glucose reference range applies only to  samples taken after fasting for at least 8 hours.   BUN 53 (H) 8 - 23 mg/dL   Creatinine, Ser 2.63 (H) 0.61 - 1.24 mg/dL   Calcium 7.9 (L) 8.9 - 10.3 mg/dL   Total Protein 5.6 (L) 6.5 - 8.1 g/dL   Albumin 2.5 (L) 3.5 - 5.0 g/dL   AST 60 (H) 15 - 41 U/L   ALT 30 0 - 44 U/L   Alkaline Phosphatase 52 38 - 126 U/L   Total Bilirubin 1.5 (H) 0.3 - 1.2 mg/dL   GFR calc non Af Amer 22 (L) >60 mL/min   GFR calc Af Amer 26 (L) >60 mL/min   Anion gap 8 5 - 15    Comment: Performed at [facility] [address] [place] [address]  CBC     Status: Abnormal   Collection Time: 09/12/19 12:40 PM  Result Value Ref Range   WBC 15.8 (H) 4.0 - 10.5 K/uL   RBC 2.03 (L) 4.22 - 5.81 MIL/uL   Hemoglobin 6.3 (LL) 13.0 - 17.0 g/dL    Comment: REPEATED TO VERIFY THIS CRITICAL RESULT HAS  VERIFIED AND BEEN CALLED TO JESSICA EASLEY,RN BY ZELDA BEECH ON 07 27 2021 AT 1324, AND HAS BEEN READ BACK.     HCT 22.2 (L) 39 - 52 %   MCV 109.4 (H) 80.0 - 100.0 fL   MCH 31.0 26.0 - 34.0 pg   MCHC 28.4 (L) 30.0 - 36.0 g/dL   RDW 19.6 (H) 11.5 - 15.5 %   Platelets 314 150 - 400 K/uL   nRBC 0.4 (H) 0.0 - 0.2 %    Comment: Performed at [facility] [address], [place], [place] [address]  Type and screen [place]     Status: None   Collection Time: 09/12/19 12:40 PM  Result Value Ref Range   ABO/RH(D) A NEG    Antibody Screen NEG    Sample Expiration 09/15/2019,2359    Unit Number [id number]    Blood Component Type RED CELLS,LR    Unit division 00    Status of Unit ISSUED,FINAL    Transfusion Status OK TO TRANSFUSE    Crossmatch Result      Compatible Performed at [facility], [place] [address] [place], [place] [address]    Unit Number [id number]    Blood Component Type RBC LR PHER2    Unit division 00    Status of Unit ISSUED,FINAL    Transfusion Status OK TO TRANSFUSE    Crossmatch Result Compatible   Folate     Status: None   Collection Time: 09/12/19  4:37 PM  Result Value Ref Range   Folate 24.7 >5.9 ng/mL    Comment: Performed at [facility], [place] [address] [place], [place] [address]  Reticulocytes     Status: Abnormal   Collection Time: 09/12/19  4:37 PM  Result Value Ref Range   Retic Ct Pct 8.4 (H) 0.4 - 3.1 %   RBC. 1.95 (L) 4.22 - 5.81 MIL/uL   Retic Count, Absolute 164.4 19.0 - 186.0 K/uL   Immature Retic Fract 36.1 (H) 2.3 - 15.9 %    Comment: Performed at [facility] [address], [place],  [address]  Urinalysis, Routine w reflex microscopic     Status: Abnormal   Collection Time: 09/12/19  4:53 PM  Result Value Ref Range   Color, Urine YELLOW YELLOW   APPearance  CLOUDY (A) CLEAR   Specific Gravity, Urine 1.016 1.005 - 1.030   pH 5.0 5.0 - 8.0   Glucose, UA NEGATIVE NEGATIVE mg/dL   Hgb  urine dipstick NEGATIVE NEGATIVE   Bilirubin Urine NEGATIVE NEGATIVE   Ketones, ur NEGATIVE NEGATIVE mg/dL   Protein, ur 30 (A) NEGATIVE mg/dL   Nitrite NEGATIVE NEGATIVE   Leukocytes,Ua MODERATE (A) NEGATIVE   RBC / HPF 0-5 0 - 5 RBC/hpf   WBC, UA 6-10 0 - 5 WBC/hpf   Bacteria, UA RARE (A) NONE SEEN   Squamous Epithelial / LPF 0-5 0 - 5   Mucus PRESENT    Hyaline Casts, UA PRESENT    Granular Casts, UA PRESENT    Amorphous Crystal PRESENT     Comment: Performed at [facility], [address], [place], [place] [address]  Protime-INR     Status: None   Collection Time: 09/12/19  5:07 PM  Result Value Ref Range   Prothrombin Time 14.4 11.4 - 15.2 seconds   INR 1.2 0.8 - 1.2    Comment: (NOTE) INR goal varies based on device and disease states. Performed at [facility], [place] [address] [place], [place] [address]   Vitamin B12     Status: None   Collection Time: 09/12/19  5:07 PM  Result Value Ref Range   Vitamin B-12 553 180 - 914 pg/mL    Comment: (NOTE) This assay is not validated for testing neonatal or myeloproliferative syndrome specimens for Vitamin B12 levels. Performed at [facility], [place] [address] [place], [place] [address]   Iron and TIBC     Status: Abnormal   Collection Time: 09/12/19  5:07 PM  Result Value Ref Range   Iron 43 (L) 45 - 182 ug/dL   TIBC 206 (L) 250 - 450 ug/dL   Saturation Ratios 21 17.9 - 39.5 %   UIBC 163 ug/dL    Comment: Performed at [facility] [address] [place], [place] [address]  Ferritin     Status: None   Collection Time: 09/12/19  5:07 PM  Result Value Ref Range   Ferritin 211 24 - 336 ng/mL    Comment: Performed at [facility] [address] [place], [place] [address]  POC occult blood, ED     Status: None   Collection Time: 09/12/19  5:26 PM  Result Value Ref Range   Fecal Occult Bld NEGATIVE NEGATIVE  Prepare RBC (crossmatch)     Status: None   Collection Time: 09/12/19  5:30 PM   Result Value Ref Range   Order Confirmation      ORDER PROCESSED BY BLOOD BANK Performed at [facility], [place] [address] [place], [place] [address]   SARS Coronavirus 2 by RT PCR (hospital order, performed in [facility] hospital lab) Nasopharyngeal Nasopharyngeal Swab     Status: None   Collection Time: 09/12/19  6:55 PM   Specimen: Nasopharyngeal Swab  Result Value Ref Range   SARS Coronavirus 2 NEGATIVE NEGATIVE    Comment: (NOTE) SARS-CoV-2 target nucleic acids are NOT DETECTED.  The SARS-CoV-2 RNA is generally detectable in upper and lower respiratory specimens during the acute phase of infection. The lowest concentration of SARS-CoV-2 viral copies this assay can detect is 250 copies / mL. A negative result does not preclude SARS-CoV-2 infection and should not be used as the sole basis for treatment or other patient management decisions.  A negative  result may occur with improper specimen collection / handling, submission of specimen other than nasopharyngeal swab, presence of viral mutation(s) within the areas targeted by this assay, and inadequate number of viral copies (<250 copies / mL). A negative result must be combined with clinical observations, patient history, and epidemiological information.  Fact Sheet for Patients:   StrictlyIdeas.no  Fact Sheet for Healthcare Providers: BankingDealers.co.za  This test is not yet approved or  cleared by the [place] FDA and has been authorized for detection and/or diagnosis of SARS-CoV-2 by FDA under an Emergency Use Authorization (EUA).  This EUA will remain in effect (meaning this test can be used) for the duration of the COVID-19 declaration under Section 564(b)(1) of the Act, 21 U.S.C. section 360bbb-3(b)(1), unless the authorization is terminated or revoked sooner.  Performed at [facility], [place] [address] [place], [place] [address]   Hemoglobin and  hematocrit, blood     Status: Abnormal   Collection Time: 09/13/19  8:08 AM  Result Value Ref Range   Hemoglobin 7.1 (L) 13.0 - 17.0 g/dL   HCT 23.1 (L) 39 - 52 %    Comment: Performed at [facility] [address] [place], [place] [address]  Comprehensive metabolic panel     Status: Abnormal   Collection Time: 09/13/19  8:08 AM  Result Value Ref Range   Sodium 145 135 - 145 mmol/L   Potassium 3.4 (L) 3.5 - 5.1 mmol/L   Chloride 113 (H) 98 - 111 mmol/L   CO2 21 (L) 22 - 32 mmol/L   Glucose, Bld 90 70 - 99 mg/dL    Comment: Glucose reference range applies only to samples taken after fasting for at least 8 hours.   BUN 56 (H) 8 - 23 mg/dL   Creatinine, Ser 2.90 (H) 0.61 - 1.24 mg/dL   Calcium 7.7 (L) 8.9 - 10.3 mg/dL   Total Protein 5.2 (L) 6.5 - 8.1 g/dL   Albumin 2.4 (L) 3.5 - 5.0 g/dL   AST 47 (H) 15 - 41 U/L   ALT 26 0 - 44 U/L   Alkaline Phosphatase 50 38 - 126 U/L   Total Bilirubin 1.7 (H) 0.3 - 1.2 mg/dL   GFR calc non Af Amer 20 (L) >60 mL/min   GFR calc Af Amer 23 (L) >60 mL/min   Anion gap 11 5 - 15    Comment: Performed at [facility] [address], [place], [place] [address]    US RENAL  Result Date: 09/13/2019 CLINICAL DATA:  Acute kidney injury EXAM: RENAL / URINARY TRACT ULTRASOUND COMPLETE COMPARISON:  None. FINDINGS: Right Kidney: Renal measurements: 11.8 x 5.7 x 3.7 cm = volume: 129.5 mL . Echogenicity and renal cortical thickness are within normal limits. No mass, perinephric fluid, or hydronephrosis visualized. No sonographically demonstrable calculus or ureterectasis. Left Kidney: Renal measurements: 11.3 x 5.3 x 3.6 cm = volume: 113.2 mL. Echogenicity and renal thickness are within normal limits. No mass, perinephric fluid, or hydronephrosis visualized. No sonographically demonstrable calculus or ureterectasis. Bladder: Appears normal for degree of bladder distention. Other: None. IMPRESSION: Study within normal limits. Electronically Signed   By:  Lowella Grip III M.D.   On: 09/13/2019 06:17   DG CHEST PORT 1 VIEW  Result Date: 09/13/2019 CLINICAL DATA:  PICC placement. EXAM: PORTABLE CHEST 1 VIEW COMPARISON:  04/28/2018 FINDINGS: New left-sided PICC has its tip projecting in the lower superior vena cava. Stable changes from previous cardiac surgery.  No mediastinal or hilar masses or evidence of adenopathy. Clear lungs.  No convincing pleural effusion or pneumothorax. Skeletal structures are grossly intact. IMPRESSION: 1. No acute cardiopulmonary disease. 2. Well-positioned left-sided PICC Electronically Signed   By: Lajean Manes M.D.   On: 09/13/2019 09:37   VAS [place] LOWER EXTREMITY VENOUS (DVT)  Result Date: 09/13/2019  Lower Venous DVTStudy Indications: Swelling.  Risk Factors: Surgery Per H&P note- recent remote LT knee replacement which became infected, was treated in [place] with washout, disc replacement, and antibiotics. Limitations: Poor ultrasound/tissue interface, swelling, bandages and body habitus. Comparison Study: No prior studies. Performing Technologist: Darlin Coco  Examination Guidelines: A complete evaluation includes B-mode imaging, spectral Doppler, color Doppler, and power Doppler as needed of all accessible portions of each vessel. Bilateral testing is considered an integral part of a complete examination. Limited examinations for reoccurring indications may be performed as noted. The reflux portion of the exam is performed with the patient in reverse Trendelenburg.  +-----+---------------+---------+-----------+----------+--------------+ RIGHTCompressibilityPhasicitySpontaneityPropertiesThrombus Aging +-----+---------------+---------+-----------+----------+--------------+ CFV  Full           Yes      Yes                                 +-----+---------------+---------+-----------+----------+--------------+   +---------+---------------+---------+-----------+----------------+-------------+ LEFT      CompressibilityPhasicitySpontaneityProperties      Thrombus                                                                  Aging         +---------+---------------+---------+-----------+----------------+-------------+ CFV      Full           Yes      Yes                                      +---------+---------------+---------+-----------+----------------+-------------+ SFJ      Full                                                             +---------+---------------+---------+-----------+----------------+-------------+ FV Prox  Full                                                             +---------+---------------+---------+-----------+----------------+-------------+ FV Mid                  Yes      Yes        Patent by color.  Unable to                                                                 visualize                                                                 compression due                                                           to edema/tissue                                                           properties.                   +---------+---------------+---------+-----------+----------------+-------------+ FV DistalFull           Yes      Yes                                      +---------+---------------+---------+-----------+----------------+-------------+ PFV      Full                                                             +---------+---------------+---------+-----------+----------------+-------------+ POP      Full           Yes      Yes                                      +---------+---------------+---------+-----------+----------------+-------------+ PTV                     Yes      Yes        Patent by color.                                                          Unable to  visualize                                                                 compression due                                                           to edema/tissue                                                           properties.                   +---------+---------------+---------+-----------+----------------+-------------+ PERO                    Yes      Yes        Patent by color.                                                          Unable to                                                                 visualize                                                                 compression due                                                           to edema/tissue                                                           properties.                   +---------+---------------+---------+-----------+----------------+-------------+     Summary: RIGHT: - No evidence of common femoral vein obstruction.  LEFT: - There is no evidence of deep vein thrombosis in the lower extremity. However,  portions of this examination were limited- see technologist comments above. -Appearance of complex collection of unknown etiology in mid-thigh measuring 5.3 x 3.8 x 5.3 cm.  - A cystic structure is found in the popliteal fossa.  *See table(s) above for measurements and observations.    Preliminary     Review of Systems  HENT: Negative for ear discharge, ear pain, hearing loss and tinnitus.   Eyes: Negative for photophobia and pain.  Respiratory: Negative for cough and shortness of breath.   Cardiovascular: Negative for chest pain.  Gastrointestinal: Negative for abdominal pain, nausea and vomiting.  Genitourinary: Negative for dysuria, flank pain, frequency and urgency.  Musculoskeletal: Positive for arthralgias (Left knee) and myalgias (Left thigh). Negative for back pain and neck pain.  Neurological: Negative  for dizziness and headaches.  Hematological: Does not bruise/bleed easily.  Psychiatric/Behavioral: The patient is not nervous/anxious.    Blood pressure (!) 123/42, pulse 64, temperature 98.1 F (36.7 C), temperature source Oral, resp. rate 16, height 5\' 11"  (1.803 m), weight (!) 99.8 kg, SpO2 94 %. Physical Exam Constitutional:      General: He is not in acute distress.    Appearance: He is well-developed. He is not diaphoretic.  HENT:     Head: Normocephalic and atraumatic.  Eyes:     General: No scleral icterus.       Right eye: No discharge.        Left eye: No discharge.     Conjunctiva/sclera: Conjunctivae normal.  Cardiovascular:     Rate and Rhythm: Normal rate and regular rhythm.  Pulmonary:     Effort: Pulmonary effort is normal. No respiratory distress.  Musculoskeletal:     Cervical back: Normal range of motion.     Comments: LLE No traumatic wounds or rash  Ecchymosis posterior thigh, minimal. Thigh edematous, mod TTP, esp lateral where there is some induration  No knee or ankle effusion, incision C/D/I, significant pain with AROM, TTP joint line  Sens DPN, SPN, TN intact  Motor EHL, ext, flex, evers 5/5  DP 1+, PT 0, 2+ pitting edema  Skin:    General: Skin is warm and dry.  Neurological:     Mental Status: He is alert.  Psychiatric:        Behavior: Behavior normal.     Assessment/Plan: Left thigh swelling -- I suspect he's got a thigh hematoma. This would account for his pain, swelling, and anemia. He could also have an iliac DVT or some other reason for his anemia. Will get CT of thigh (will have to do w/o contrast 2/2 AKI) to assess. Awaiting [place] results. I do not think any mobility restrictions are needed and pt may WBAT though he should have his leg elevated whenever he's not ambulating. Will f/u after CT. Multiple medical problems including AS, CAD, HTN, HLD, and thyroid disease -- per primary service    Lisette Abu, PA-C Orthopedic  Surgery [phone] 09/13/2019, 3:47 PM

## 2019-09-13 NOTE — Progress Notes (Signed)
PT Cancellation Note  Patient Details Name: Luis Herrera MRN: [id number] DOB: 13-Dec-1940   Cancelled Treatment:    Reason Eval/Treat Not Completed: Patient declined, no reason specified. Pt reported that he has had a lot going on today and that he is too tired for PT. Agreeable to PT evaluation tomorrow. PT will continue to f/u with pt acutely as available.    Clearnce Sorrel Victoire Deans 09/13/2019, 2:03 PM

## 2019-09-13 NOTE — Progress Notes (Signed)
Pharmacy Antibiotic Note  Luis Herrera is a 79 y.o. male admitted on 09/12/2019 with septic joint.  Pharmacy has been consulted for daptomycin dosing. Also on ceftriaxone per MD. SCr trended up 2.6>2.9 today, CrCl~25.  Plan: Daptomycin 800mg  (8mg /kg) IV q48h Ceftriaxone 2g IV q24h per MD Monitor clinical progress, c/s, renal function, CK weekly F/u de-escalation plan/LOT   Height: 5\' 11"  (180.3 cm) Weight: (!) 99.8 kg (220 lb) IBW/kg (Calculated) : 75.3  Temp (24hrs), Avg:98.3 F (36.8 C), Min:98 F (36.7 C), Max:98.5 F (36.9 C)  Recent Labs  Lab 09/12/19 1240 09/13/19 0808  WBC 15.8*  --   CREATININE 2.63* 2.90*    Estimated Creatinine Clearance: 25.3 mL/min (A) (by C-G formula based on SCr of 2.9 mg/dL (H)).    Allergies  Allergen Reactions  . Sulfamethoxazole-Trimethoprim Other (See Comments)    Carlyn Reichert Syndrome    Arturo Morton, PharmD, BCPS Please check AMION for all [facility] contact numbers Clinical Pharmacist 09/13/2019 10:32 PM

## 2019-09-14 ENCOUNTER — Inpatient Hospital Stay (HOSPITAL_COMMUNITY): Payer: Medicare Other

## 2019-09-14 DIAGNOSIS — Z952 Presence of prosthetic heart valve: Secondary | ICD-10-CM

## 2019-09-14 DIAGNOSIS — T8454XA Infection and inflammatory reaction due to internal left knee prosthesis, initial encounter: Secondary | ICD-10-CM

## 2019-09-14 DIAGNOSIS — D649 Anemia, unspecified: Secondary | ICD-10-CM

## 2019-09-14 DIAGNOSIS — R748 Abnormal levels of other serum enzymes: Secondary | ICD-10-CM

## 2019-09-14 DIAGNOSIS — Z96659 Presence of unspecified artificial knee joint: Secondary | ICD-10-CM

## 2019-09-14 LAB — ECHOCARDIOGRAM LIMITED
AR max vel: 1.2 cm2
AV Area VTI: 1.21 cm2
AV Area mean vel: 1.36 cm2
AV Mean grad: 32.7 mmHg
AV Peak grad: 62.8 mmHg
Ao pk vel: 3.96 m/s
Height: 71 in
P 1/2 time: 268 msec
S' Lateral: 3.6 cm
Weight: 3520 oz

## 2019-09-14 LAB — BASIC METABOLIC PANEL
Anion gap: 8 (ref 5–15)
BUN: 57 mg/dL — ABNORMAL HIGH (ref 8–23)
CO2: 24 mmol/L (ref 22–32)
Calcium: 7.7 mg/dL — ABNORMAL LOW (ref 8.9–10.3)
Chloride: 113 mmol/L — ABNORMAL HIGH (ref 98–111)
Creatinine, Ser: 2.92 mg/dL — ABNORMAL HIGH (ref 0.61–1.24)
GFR calc Af Amer: 23 mL/min — ABNORMAL LOW (ref >60)
GFR calc non Af Amer: 20 mL/min — ABNORMAL LOW (ref >60)
Glucose, Bld: 111 mg/dL — ABNORMAL HIGH (ref 70–99)
Potassium: 3.5 mmol/L (ref 3.5–5.1)
Sodium: 145 mmol/L (ref 135–145)

## 2019-09-14 LAB — CBC
HCT: 24.7 % — ABNORMAL LOW (ref 39.0–52.0)
Hemoglobin: 7.7 g/dL — ABNORMAL LOW (ref 13.0–17.0)
MCH: 31.8 pg (ref 26.0–34.0)
MCHC: 31.2 g/dL (ref 30.0–36.0)
MCV: 102.1 fL — ABNORMAL HIGH (ref 80.0–100.0)
Platelets: 265 10*3/uL (ref 150–400)
RBC: 2.42 MIL/uL — ABNORMAL LOW (ref 4.22–5.81)
RDW: 20.1 % — ABNORMAL HIGH (ref 11.5–15.5)
WBC: 14.9 10*3/uL — ABNORMAL HIGH (ref 4.0–10.5)
nRBC: 0 % (ref 0.0–0.2)

## 2019-09-14 LAB — CK: Total CK: 870 U/L — ABNORMAL HIGH (ref 49–397)

## 2019-09-14 LAB — PREPARE RBC (CROSSMATCH)

## 2019-09-14 LAB — HEMOGLOBIN AND HEMATOCRIT, BLOOD
HCT: 27.8 % — ABNORMAL LOW (ref 39.0–52.0)
Hemoglobin: 8.6 g/dL — ABNORMAL LOW (ref 13.0–17.0)

## 2019-09-14 LAB — HAPTOGLOBIN: Haptoglobin: <10 mg/dL — ABNORMAL LOW (ref 34–355)

## 2019-09-14 MED ORDER — SODIUM CHLORIDE 0.9% IV SOLUTION: Freq: Once | INTRAVENOUS | Status: DC

## 2019-09-14 MED ORDER — FUROSEMIDE 10 MG/ML IJ SOLN
20.0000 mg | Freq: Once | INTRAMUSCULAR | Status: AC
  Administered 2019-09-14: 20 mg via INTRAVENOUS
  Filled 2019-09-14: qty 2

## 2019-09-14 NOTE — Consult Note (Signed)
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 Was receiving vancomycin and pip/tazo and now has AKI with creatinine of 2.92. Renal ultrasound without significant finding. Agree with change to daptomycin and ceftriaxone. Will obtain culture records to see about narrowing antibiotics. No infection in the thigh and PICC line looks to be without evidence of infection.  PLAN:  1. Continue daptomycin and ceftriaxone.  2. Obtain previous culture results.  3. Monitor CK levels while on daptomycin. 4. AKI per primary team.    Principal Problem:   Symptomatic anemia Active Problems:   Hyperlipidemia with target LDL less than 130   Essential hypertension   Aortic valve replaced   CAD (coronary artery disease)   Hypothyroidism   AKI (acute kidney injury) (HCC)   Anemia   Pressure injury of skin    amiodarone  100 mg Oral Daily   amLODipine  10 mg Oral Daily   carvedilol  6.25 mg Oral BID   Chlorhexidine Gluconate Cloth  6 each Topical Daily   ezetimibe  10 mg Oral Daily   hydrALAZINE  25 mg Oral BID   levothyroxine  50 mcg Oral QAC breakfast   multivitamin with minerals  1 tablet Oral Daily   pantoprazole (PROTONIX) IV  40 mg Intravenous Q24H   sodium chloride flush  10-40 mL Intracatheter Q12H     HPI: Luis Herrera is a 79 y.o. 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  Luis Herrera was recently hospitalized in [place] with infection of the left knee status post washout and disc replacement.  Receiving broad-spectrum antibiotics with vancomycin and pip/tazo for culture-negative infection.  Awaiting records from previous hospitalization.  Also noted to have painful swollen thigh.  Transfused 2 units of packed red blood cells.  Blood work with acute kidney injury and creatinine of 2.9.  Renal ultrasound unremarkable.  CT with large complex hemorrhagic collection within the quadriceps musculature.   Luis Herrera has been afebrile since admission and stable WBC count of 14.9. Orthopedics consulted with no surgical intervention at this time. ID consulted for antibiotic recommendations.   Review of Systems: Review of Systems  Constitutional: Negative for chills, fever and weight loss.  Respiratory: Negative for cough, shortness of breath and wheezing.   Cardiovascular: Negative for chest pain and leg swelling.  Gastrointestinal: Negative for abdominal pain, constipation, diarrhea, nausea and vomiting.  Skin: Negative for rash.     Past Medical History:  Diagnosis Date   Aortic stenosis 2008   Arthritis    Blood transfusion    Blood transfusion without reported diagnosis    CAD (coronary artery disease) 2008   Cataract    Complication of anesthesia    hallucinated once   H/O hiatal hernia    Heart murmur    AFTER  REPLACED VALVE WENT AWAY   Hx of colonic polyps    Hyperlipidemia    Hypertension    Thyroid disease     Social History   Tobacco Use   Smoking status: Former Smoker    Quit date: 05/25/1973    Years since quitting: 46.3   Smokeless tobacco: Never Used  Vaping Use   Vaping Use: Never used  Substance Use Topics   Alcohol use: Not Currently    Comment: rare   Drug use: No    Family History  Problem Relation Age of Onset   Colon cancer Mother 57    Heart attack Father    Hypertension Other    Hyperlipidemia Other    Heart attack Brother    Hypertension Sister    Hypertension Brother    Stroke Neg Hx    Esophageal cancer Neg Hx    Rectal cancer Neg Hx    Stomach cancer Neg Hx     Allergies  Allergen Reactions   Sulfamethoxazole-Trimethoprim Other (See Comments)    Carlyn Reichert Syndrome    OBJECTIVE: Blood pressure (!) 143/46, pulse 72, temperature 98 F (36.7 C), temperature source Oral, resp. rate 16, height 5\' 11"  (1.803 m), weight (!) 99.8 kg, SpO2 92 %.  Physical Exam Constitutional:      General: He is not in acute distress.    Appearance: He is well-developed.     Comments: Lying in bed with head of bed elevated; pleasant.   Cardiovascular:     Rate and Rhythm: Normal rate and regular rhythm.     Heart sounds: Normal heart sounds.  Pulmonary:     Effort: Pulmonary effort is normal.     Breath sounds: Normal breath sounds.  Musculoskeletal:     Comments: Mild edema of left thigh. Surgical incision is clean, dry and intact.   Skin:    General: Skin is warm and dry.  Neurological:     Mental Status: He is alert and oriented to person, place, and time.     Lab Results Lab Results  Component Value Date   WBC 14.9 (H) 09/14/2019   HGB 7.7 (L) 09/14/2019   HCT 24.7 (L) 09/14/2019   MCV 102.1 (H) 09/14/2019   PLT 265 09/14/2019    Lab Results  Component Value Date   CREATININE 2.92 (H) 09/14/2019   BUN 57 (H) 09/14/2019   NA 145 09/14/2019   K 3.5 09/14/2019   CL 113 (H) 09/14/2019   CO2 24 09/14/2019    Lab Results  Component Value Date   ALT 26 09/13/2019   AST 47 (H) 09/13/2019   ALKPHOS 50 09/13/2019   BILITOT 1.7 (H) 09/13/2019     Microbiology: Recent Results (from the past 240 hour(s))  SARS Coronavirus 2 by RT PCR (hospital order, performed in [place] hospital lab) Nasopharyngeal Nasopharyngeal Swab     Status: None   Collection Time: 09/12/19  6:55 PM   Specimen:  Nasopharyngeal Swab  Result Value Ref Range Status   SARS Coronavirus 2 NEGATIVE NEGATIVE Final    Comment: (NOTE) SARS-CoV-2 target nucleic acids are NOT DETECTED.  The SARS-CoV-2 RNA is generally detectable in upper and lower respiratory specimens during the acute phase of infection. The lowest concentration of SARS-CoV-2 viral copies this assay can detect is 250 copies / mL. A negative result does not preclude SARS-CoV-2 infection and should not be used as the sole basis for treatment or other patient management decisions.  A negative result may  occur with improper specimen collection / handling, submission of specimen other than nasopharyngeal swab, presence of viral mutation(s) within the areas targeted by this assay, and inadequate number of viral copies (<250 copies / mL). A negative result must be combined with clinical observations, patient history, and epidemiological information.  Fact Sheet for Patients:   StrictlyIdeas.no  Fact Sheet for Healthcare Providers: BankingDealers.co.za  This test is not yet approved or  cleared by the [place] FDA and has been authorized for detection and/or diagnosis of SARS-CoV-2 by FDA under an Emergency Use Authorization (EUA).  This EUA will remain in effect (meaning this test can be used) for the duration of the COVID-19 declaration under Section 564(b)(1) of the Act, 21 U.S.C. section 360bbb-3(b)(1), unless the authorization is terminated or revoked sooner.  Performed at [facility], [place] [address] [place], [place] [address]      Terri Piedra, [facility]  09/14/2019  10:50 AM

## 2019-09-14 NOTE — Evaluation (Signed)
Physical Therapy Evaluation Patient Details Name: Luis Herrera MRN: [id number] DOB: [birth year] Today's Date: 09/14/2019   History of Present Illness  Pt is a 79 y/o male admitted secondary to symptomatic anemia and found to have low hemoglobin. Ortho was consulted and found that pt has a large L thigh hematoma, no restrictions and recommending WBAT. PMH including but not limited to CAD, HTN, L TKA in 2000, R TKA in 2013.    Clinical Impression  Pt presented supine in bed with HOB elevated, awake and willing to participate in therapy session. Prior to admission, pt reported that he ambulated with use of a RW and was independent with ADLs. Pt lives with his wife in a two level house with a level entry. At the time of evaluation, pt able to perform bed mobility with mod A, transfers with min guard and ambulated with RW in hallway with min guard for safety. Pt with zero tolerance for any L knee flexion at this time, either with AROM or during functional mobility and therefore heavily compensating with R LE. No LOB or need for physical assistance other than to return bilateral LEs onto bed, which pt states his wife assists him with at home. Pt would continue to benefit from skilled physical therapy services at this time while admitted and after d/c to address the below listed limitations in order to improve overall safety and independence with functional mobility.     Follow Up Recommendations Outpatient PT    Equipment Recommendations  None recommended by PT    Recommendations for Other Services       Precautions / Restrictions Precautions Precautions: Fall Restrictions Weight Bearing Restrictions: Yes LLE Weight Bearing: Weight bearing as tolerated      Mobility  Bed Mobility Overal bed mobility: Needs Assistance Bed Mobility: Supine to Sit;Sit to Supine     Supine to sit: Min guard Sit to supine: Mod assist   General bed mobility comments: increased time and effort, HOB elevated,  use of bed rails, assistance needed to return bilateral LEs onto bed  Transfers Overall transfer level: Needs assistance Equipment used: Rolling walker (2 wheeled) Transfers: Sit to/from Stand Sit to Stand: Min guard         General transfer comment: increased time, use of momentum, bed slightly elevated, min guard for safety, good technique utilized  Ambulation/Gait Ambulation/Gait assistance: Counsellor (Feet): 75 Feet Assistive device: Rolling walker (2 wheeled) Gait Pattern/deviations: Step-to pattern;Decreased step length - right;Decreased stance time - left;Decreased stride length;Decreased weight shift to left;Antalgic Gait velocity: reduced   General Gait Details: pt with slow steady gait with use of RW; pt unable to tolerate any L knee flexion during swing phase, maintaining knee in full extension. No LOB or need for physical assistance. Limited secondary to pain  Stairs            Wheelchair Mobility    Modified Rankin (Stroke Patients Only)       Balance Overall balance assessment: Needs assistance Sitting-balance support: Feet supported Sitting balance-Leahy Scale: Good     Standing balance support: During functional activity;Single extremity supported;Bilateral upper extremity supported Standing balance-Leahy Scale: Poor                               Pertinent Vitals/Pain Pain Assessment: Faces Faces Pain Scale: Hurts little more Pain Location: L knee Pain Descriptors / Indicators: Guarding;Sore Pain Intervention(s): Monitored during session    Home  Living Family/patient expects to be discharged to:: Private residence Living Arrangements: Spouse/significant other Available Help at Discharge: Family Type of Home: House Home Access: Level entry     [place]: Two level;Bed/bath [place]: Environmental consultant - 2 wheels;Shower seat      Prior Function Level of Independence: Independent with assistive device(s)          Comments: ambulates with use of RW     Hand Dominance        Extremity/Trunk Assessment   Upper Extremity Assessment Upper Extremity Assessment: Overall WFL for tasks assessed    Lower Extremity Assessment Lower Extremity Assessment: LLE deficits/detail LLE Deficits / Details: pt with decreased strength and ROM limitations secondary to pain. Pt unable to tolerate any flexion at this time LLE: Unable to fully assess due to pain    Cervical / Trunk Assessment Cervical / Trunk Assessment: Normal  Communication   Communication: No difficulties  Cognition Arousal/Alertness: Awake/alert Behavior During Therapy: WFL for tasks assessed/performed Overall Cognitive Status: Within Functional Limits for tasks assessed                                        General Comments      Exercises     Assessment/Plan    PT Assessment Patient needs continued PT services  PT Problem List Decreased strength;Decreased range of motion;Decreased activity tolerance;Decreased balance;Decreased mobility;Decreased coordination;Decreased knowledge of use of DME;Decreased safety awareness;Decreased knowledge of precautions;Pain       PT Treatment Interventions DME instruction;Gait training;Functional mobility training;Stair training;Therapeutic activities;Therapeutic exercise;Balance training;Neuromuscular re-education;Patient/family education    PT Goals (Current goals can be found in the Care Plan section)  Acute Rehab PT Goals Patient Stated Goal: to decrease pain PT Goal Formulation: With patient Time For Goal Achievement: 09/28/19 Potential to Achieve Goals: Good    Frequency Min 3X/week   Barriers to discharge        Co-evaluation               AM-PAC PT "6 Clicks" Mobility  Outcome Measure Help needed turning from your back to your side while in a flat bed without using bedrails?: None Help needed moving from lying on your back to sitting on the  side of a flat bed without using bedrails?: None Help needed moving to and from a bed to a chair (including a wheelchair)?: A Little Help needed standing up from a chair using your arms (e.g., wheelchair or bedside chair)?: None Help needed to walk in hospital room?: A Little Help needed climbing 3-5 steps with a railing? : A Lot 6 Click Score: 20    End of Session Equipment Utilized During Treatment: Gait belt Activity Tolerance: Patient limited by pain Patient left: in bed;with call bell/phone within reach;with bed alarm set Nurse Communication: Mobility status PT Visit Diagnosis: Other abnormalities of gait and mobility (R26.89)    Time: 1034-1050 PT Time Calculation (min) (ACUTE ONLY): 16 min   Charges:   PT Evaluation $PT Eval Moderate Complexity: 1 Mod          Eduard Clos, PT, DPT  Acute Rehabilitation Services Pager [phone] Office [place] 09/14/2019, 12:31 PM

## 2019-09-14 NOTE — Progress Notes (Signed)
Patient ID: Luis Herrera, male   DOB: August 19, 1940, 79 y.o.   MRN: [id number]   LOS: 2 days   Subjective: [place] in clinical status   Objective: Vital signs in last 24 hours: Temp:  [98 F (36.7 C)-98.2 F (36.8 C)] 98 F (36.7 C) (07/29 0550) Pulse Rate:  [64-77] 72 (07/29 0550) Resp:  [16-17] 16 (07/29 0550) BP: (123-143)/(42-52) 143/46 (07/29 0550) SpO2:  [91 %-94 %] 92 % (07/29 0550) Last BM Date: 09/12/19   Laboratory  CBC Recent Labs    09/12/19 1240 09/13/19 0808 09/13/19 2024 09/14/19 0423  WBC 15.8*  --   --  14.9*  HGB 6.3*   < > 7.8* 7.7*  HCT 22.2*   < > 24.8* 24.7*  PLT 314  --   --  265   < > = values in this interval not displayed.   BMET Recent Labs    09/13/19 0808 09/14/19 0423  NA 145 145  K 3.4* 3.5  CL 113* 113*  CO2 21* 24  GLUCOSE 90 111*  BUN 56* 57*  CREATININE 2.90* 2.92*  CALCIUM 7.7* 7.7*     Physical Exam General appearance: alert and no distress   Assessment/Plan: Left thigh hematoma -- Fortunately no e/o clot and hgb appears stable no likely no ongoing bleeding. No surgical intervention recommended at this point. No need for compression either as I worry about skin issues in this patient. He may continue to be WBAT and should f/u with his surgeon in [place] next week as scheduled.    Lisette Abu, PA-C Orthopedic Surgery [phone] 09/14/2019

## 2019-09-14 NOTE — Progress Notes (Signed)
Progress Note    Ahmad Vanwey  UMP:[id number] DOB: May 04, 1940  DOA: 09/12/2019 PCP: [name], MD    Brief Narrative:     Medical records reviewed and are as summarized below:  Ching Rabideau is an 79 y.o. male who was sent to the ER after outpatient labs done yesterday revealed a hemoglobin of 5.5. Today in the ER his hemoglobin is 6.3.  per visiting home nursing, the initial hemoglobin they collected 1 week ago was 11.5.  His creatinine today in the ER is 2.63.  Yesterday's creatinine was 1.2.  [place] initial creatinine was 0.97.  He was recently hospitalized in [place] with infection of the left knee.  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  He states he had a washout of the knee and the disc was replaced.  The cultures were negative, therefore, he was discharged with a PICC line on broad-spectrum antibiotics Zosyn and vancomycin.  He was discharged with aspirin 81 mg p.o. twice daily which is an increased dosage from once daily.  He was on meloxicam which was DC'd and he was started on oxycodone.  RECORDS FROM [facility]:  Labs: 7/16: 11.5  7/17: hgb: 8.9  7/19am 5.8/6.0 (got 2 units PRBC)-->  7.7     7/20 7.7  Cr: 7/17: 0.98   7/19: 1.35  7/20: 1.07 Final report from Microbiology: No growth (7/22)   Assessment/Plan:   Principal Problem:   Symptomatic anemia Active Problems:   Hyperlipidemia with target LDL less than 130   Essential hypertension   Aortic valve replaced   CAD (coronary artery disease)   Hypothyroidism   AKI (acute kidney injury) (HCC)   Anemia   Pressure injury of skin   Symptomatic Anemia -Hematoma in thigh vs hemolysis (LDH up and haptoglobin down) -patient is s/p 4 units since surgery (2 with [facility] and 2 here) -heme negative U/S shows complex collection of unknown substance: Appearance of complex collection of unknown etiology in mid-thigh  measuring 5.3 x 3.8 x 5.3 cm.  -CT scan: Large complex hemorrhagic  collection extending in the suprapatellar joint space and within the quadriceps musculature to the level of the mid femur. There are small foci of air seen within the collection and this could represent infected hematoma.  AKI- most likely due to abx -renal u/s unrevealing -stopped IVF as he appeared to be getting volume overloaded -monitor closely -check FENA nephrology consult if not improving-- appears to have peaked -strict I/Os  Recent left knee debridement with disc replacement -was sent home with IV Abx and PICC Line -U/s with above results -left thigh: 70; right 54-- measure daily -reviewed [facility] -recommended 6 weeks IV abx (cultures with no growth) -will get ID consult  CAD (coronary artery disease)/frequent PVCs -Stable, home meds resumed except aspirin for now -Continue amiodarone, treatment for patient's frequent PVCs  aortic valve replacement/ -? New murmur-- from documentation reviewed, no mention of murmur prior -?hemolysis (less likely) contributing to his anemia -limited echo  Essential hypertension -stable, home meds resumed  Hyperlipidemia with target LDL less than 130 -Stable, home meds resumed   Hypothyroidism -Stable, home meds resumed  obesity Body mass index is 30.68 kg/m.  Pressure Injury 09/13/19 Stage 1 -  Intact skin with non-blanchable redness of a localized area usually over a bony prominence. (Active)  09/13/19 1248  Location:   Location Orientation:   Staging: Stage 1 -  Intact skin with non-blanchable redness of a localized  area usually over a bony prominence.  Wound Description (Comments):   Present on Admission: Yes       Family Communication/Anticipated D/C date and plan/Code Status   DVT prophylaxis: scd Code Status: Full Code.  Family Communication: wife at beside Disposition Plan: Status is: Inpatient  Remains inpatient appropriate because:Hemodynamically unstable   Dispo: The patient is from:  Home              Anticipated d/c is to: Home              Anticipated d/c date is: > 3 days              Patient currently is not medically stable to d/c.         Medical Consultants:    Ortho  ID  Cardiology       Subjective:   Still very SOB/winded  Objective:    Vitals:   09/13/19 1716 09/14/19 0000 09/14/19 0550 09/14/19 1245  BP: (!) 135/52 (!) 132/43 (!) 143/46 (!) 130/47  Pulse: 77 73 72 77  Resp: 16 17 16 17   Temp: 98 F (36.7 C) 98.2 F (36.8 C) 98 F (36.7 C) 98.2 F (36.8 C)  TempSrc: Oral Oral Oral Oral  SpO2: 91% 91% 92% 92%  Weight:      Height:        Intake/Output Summary (Last 24 hours) at 09/14/2019 1629 Last data filed at 09/14/2019 0440 Gross per 24 hour  Intake 116 ml  Output 550 ml  Net -434 ml   Filed Weights   09/12/19 1752  Weight: (!) 99.8 kg    Exam:   General: Appearance:    Obese male in no acute distress     Lungs:     Diminished but not wheezing, mild increased work of breathing with talking  Heart:    Normal heart rate. Normal rhythm.  +murmur  MS:   All extremities are intact. Left leg still swollen  Neurologic:   Awake, alert, oriented x 3. No apparent focal neurological           defect.      Data Reviewed:   I have personally reviewed following labs and imaging studies:  Labs: Labs show the following:   Basic Metabolic Panel: Recent Labs  Lab 09/12/19 1240 09/12/19 1240 09/13/19 0808 09/14/19 0423  NA 144  --  145 145  K 3.7   < > 3.4* 3.5  CL 113*  --  113* 113*  CO2 23  --  21* 24  GLUCOSE 118*  --  90 111*  BUN 53*  --  56* 57*  CREATININE 2.63*  --  2.90* 2.92*  CALCIUM 7.9*  --  7.7* 7.7*   < > = values in this interval not displayed.   GFR Estimated Creatinine Clearance: 25.1 mL/min (A) (by C-G formula based on SCr of 2.92 mg/dL (H)). Liver Function Tests: Recent Labs  Lab 09/12/19 1240 09/13/19 0808  AST 60* 47*  ALT 30 26  ALKPHOS 52 50  BILITOT 1.5* 1.7*  PROT 5.6*  5.2*  ALBUMIN 2.5* 2.4*   No results for input(s): LIPASE, AMYLASE in the last 168 hours. No results for input(s): AMMONIA in the last 168 hours. Coagulation profile Recent Labs  Lab 09/12/19 1707  INR 1.2    CBC: Recent Labs  Lab 09/12/19 1240 09/13/19 0808 09/13/19 2024 09/14/19 0423  WBC 15.8*  --   --  14.9*  HGB 6.3* 7.1*  7.8* 7.7*  HCT 22.2* 23.1* 24.8* 24.7*  MCV 109.4*  --   --  102.1*  PLT 314  --   --  265   Cardiac Enzymes: Recent Labs  Lab 09/14/19 0423  CKTOTAL 870*   BNP (last 3 results) Recent Labs    12/13/18 0832 05/22/19 1028  PROBNP 312 333   CBG: No results for input(s): GLUCAP in the last 168 hours. D-Dimer: No results for input(s): DDIMER in the last 72 hours. Hgb A1c: No results for input(s): HGBA1C in the last 72 hours. Lipid Profile: No results for input(s): CHOL, HDL, LDLCALC, TRIG, CHOLHDL, LDLDIRECT in the last 72 hours. Thyroid function studies: No results for input(s): TSH, T4TOTAL, T3FREE, THYROIDAB in the last 72 hours.  Invalid input(s): FREET3 Anemia work up: Recent Labs    09/12/19 1637 09/12/19 1707 09-23-2019 2024  VITAMINB12  --  553  --   FOLATE 24.7  --   --   FERRITIN  --  211  --   TIBC  --  206*  --   IRON  --  43*  --   RETICCTPCT 8.4*  --  6.1*   Sepsis Labs: Recent Labs  Lab 09/12/19 1240 09/14/19 0423  WBC 15.8* 14.9*    Microbiology Recent Results (from the past 240 hour(s))  SARS Coronavirus 2 by RT PCR (hospital order, performed in [facility] hospital lab) Nasopharyngeal Nasopharyngeal Swab     Status: None   Collection Time: 09/12/19  6:55 PM   Specimen: Nasopharyngeal Swab  Result Value Ref Range Status   SARS Coronavirus 2 NEGATIVE NEGATIVE Final    Comment: (NOTE) SARS-CoV-2 target nucleic acids are NOT DETECTED.  The SARS-CoV-2 RNA is generally detectable in upper and lower respiratory specimens during the acute phase of infection. The lowest concentration of SARS-CoV-2 viral  copies this assay can detect is 250 copies / mL. A negative result does not preclude SARS-CoV-2 infection and should not be used as the sole basis for treatment or other patient management decisions.  A negative result may occur with improper specimen collection / handling, submission of specimen other than nasopharyngeal swab, presence of viral mutation(s) within the areas targeted by this assay, and inadequate number of viral copies (<250 copies / mL). A negative result must be combined with clinical observations, patient history, and epidemiological information.  Fact Sheet for Patients:   StrictlyIdeas.no  Fact Sheet for Healthcare Providers: BankingDealers.co.za  This test is not yet approved or  cleared by the [place] FDA and has been authorized for detection and/or diagnosis of SARS-CoV-2 by FDA under an Emergency Use Authorization (EUA).  This EUA will remain in effect (meaning this test can be used) for the duration of the COVID-19 declaration under Section 564(b)(1) of the Act, 21 U.S.C. section 360bbb-3(b)(1), unless the authorization is terminated or revoked sooner.  Performed at [facility], [place] [address] [place], [place] [address]     Procedures and diagnostic studies:  US RENAL  Result Date: 09-23-2019 CLINICAL DATA:  Acute kidney injury EXAM: RENAL / URINARY TRACT ULTRASOUND COMPLETE COMPARISON:  None. FINDINGS: Right Kidney: Renal measurements: 11.8 x 5.7 x 3.7 cm = volume: 129.5 mL . Echogenicity and renal cortical thickness are within normal limits. No mass, perinephric fluid, or hydronephrosis visualized. No sonographically demonstrable calculus or ureterectasis. Left Kidney: Renal measurements: 11.3 x 5.3 x 3.6 cm = volume: 113.2 mL. Echogenicity and renal thickness are within normal limits. No mass, perinephric fluid, or hydronephrosis  visualized. No sonographically demonstrable calculus or  ureterectasis. Bladder: Appears normal for degree of bladder distention. Other: None. IMPRESSION: Study within normal limits. Electronically Signed   By: Lowella Grip III M.D.   On: 09/13/2019 06:17   CT FEMUR LEFT WO CONTRAST  Result Date: 09/13/2019 CLINICAL DATA:  New soft tissue mass, spontaneous hemorrhage EXAM: CT OF THE LOWER LEFT EXTREMITY WITHOUT CONTRAST TECHNIQUE: Multidetector CT imaging of the lower left extremity was performed according to the standard protocol. COMPARISON:  None. 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 No periprosthetic lucency or fracture is identified. The joint spaces appear to be well maintained. There is complex hemorrhagic collections seen within the suprapatellar joint space extending superiorly. Ligaments Suboptimally assessed by CT. Muscles and Tendons Complex hyperdense hemorrhagic collection within the anterior quadriceps musculature measuring approximately 9.7 x 4.8 by 18.9 cm. The collection contains small foci air is well and extends to the level of the mid femur. The remainder of the muscles appear to be intact. The quadriceps and patellar tendons are intact. Soft tissues There is diffuse subcutaneous edema and skin thickening seen surrounding the mid to lower femur. No subcutaneous emphysema within the soft tissues. Scattered vascular calcifications are noted. IMPRESSION: Large complex hemorrhagic collection extending in the suprapatellar joint space and within the quadriceps musculature to the level of the mid femur. There are small foci of air seen within the collection and this could represent infected hematoma. Diffuse subcutaneous edema and skin thickening which could be due to cellulitis. No subcutaneous emphysema. No acute osseous abnormality. Electronically Signed   By: Prudencio Pair M.D.   On: 09/13/2019 19:34   DG CHEST PORT 1 VIEW  Result Date: 09/13/2019 CLINICAL  DATA:  PICC placement. EXAM: PORTABLE CHEST 1 VIEW COMPARISON:  04/28/2018 FINDINGS: New left-sided PICC has its tip projecting in the lower superior vena cava. Stable changes from previous cardiac surgery. No mediastinal or hilar masses or evidence of adenopathy. Clear lungs.  No convincing pleural effusion or pneumothorax. Skeletal structures are grossly intact. IMPRESSION: 1. No acute cardiopulmonary disease. 2. Well-positioned left-sided PICC Electronically Signed   By: Lajean Manes M.D.   On: 09/13/2019 09:37   VAS [place] LOWER EXTREMITY VENOUS (DVT)  Result Date: 09/13/2019  Lower Venous DVTStudy Indications: Swelling.  Risk Factors: Surgery Per H&P note- recent remote LT knee replacement which became infected, was treated in [place] with washout, disc replacement, and antibiotics. Limitations: Poor ultrasound/tissue interface, swelling, bandages and body habitus. Comparison Study: No prior studies. Performing Technologist: Darlin Coco  Examination Guidelines: A complete evaluation includes B-mode imaging, spectral Doppler, color Doppler, and power Doppler as needed of all accessible portions of each vessel. Bilateral testing is considered an integral part of a complete examination. Limited examinations for reoccurring indications may be performed as noted. The reflux portion of the exam is performed with the patient in reverse Trendelenburg.  +-----+---------------+---------+-----------+----------+--------------+ RIGHTCompressibilityPhasicitySpontaneityPropertiesThrombus Aging +-----+---------------+---------+-----------+----------+--------------+ CFV  Full           Yes      Yes                                 +-----+---------------+---------+-----------+----------+--------------+   +---------+---------------+---------+-----------+----------------+-------------+ LEFT     CompressibilityPhasicitySpontaneityProperties      Thrombus  Aging         +---------+---------------+---------+-----------+----------------+-------------+ CFV      Full           Yes      Yes                                      +---------+---------------+---------+-----------+----------------+-------------+ SFJ      Full                                                             +---------+---------------+---------+-----------+----------------+-------------+ FV Prox  Full                                                             +---------+---------------+---------+-----------+----------------+-------------+ FV Mid                  Yes      Yes        Patent by color.                                                          Unable to                                                                 visualize                                                                 compression due                                                           to edema/tissue                                                           properties.                   +---------+---------------+---------+-----------+----------------+-------------+ FV DistalFull           Yes      Yes                                      +---------+---------------+---------+-----------+----------------+-------------+  PFV      Full                                                             +---------+---------------+---------+-----------+----------------+-------------+ POP      Full           Yes      Yes                                      +---------+---------------+---------+-----------+----------------+-------------+ PTV                     Yes      Yes        Patent by color.                                                          Unable to                                                                 visualize                                                                  compression due                                                           to edema/tissue                                                           properties.                   +---------+---------------+---------+-----------+----------------+-------------+ PERO                    Yes      Yes        Patent by color.                                                          Unable to  visualize                                                                 compression due                                                           to edema/tissue                                                           properties.                   +---------+---------------+---------+-----------+----------------+-------------+     Summary: RIGHT: - No evidence of common femoral vein obstruction.  LEFT: - There is no evidence of deep vein thrombosis in the lower extremity. However, portions of this examination were limited- see technologist comments above. -Appearance of complex collection of unknown etiology in mid-thigh measuring 5.3 x 3.8 x 5.3 cm.  - A cystic structure is found in the popliteal fossa.  *See table(s) above for measurements and observations. Electronically signed by Curt Jews MD on 09/13/2019 at 5:17:14 PM.    Final    ECHOCARDIOGRAM LIMITED  Result Date: 09/14/2019    ECHOCARDIOGRAM LIMITED REPORT   Patient Name:   HALLIE ISHIDA Date of Exam: 09/13/2019 Medical Rec #:  [id number]    Height:       71.0 in Accession #:    [id number]   Weight:       220.0 lb Date of Birth:  09/05/1940     BSA:          2.196 m Patient Age:    20 years     BP:           123/42 mmHg Patient Gender: M            HR:           64 bpm. Exam Location:  Inpatient Procedure: Limited Echo, Cardiac Doppler and Color Doppler Indications:    Aortic valve disorder  History:        Patient has prior history of Echocardiogram  examinations, most                 recent 04/13/2018. CAD, Aortic Valve Disease, Arrythmias:PVC;                 Risk Factors:Dyslipidemia and Hypertension. S/p aortic valve                 replacement.                 Aortic Valve: bioprosthetic valve is present in the aortic                 position.  Sonographer:    Clayton Lefort RDCS (AE) Referring Phys: [place]  1. Left ventricular ejection fraction, by estimation, is 60 to 65%. The left  ventricle has normal function. There is moderate left ventricular hypertrophy.  2. Right ventricular systolic function is normal. The right ventricular size is normal. There is moderately elevated pulmonary artery systolic pressure.  3. Mild mitral valve regurgitation.  4. Bioprosthetic aortic valve leaflets not well-visualized but appear thickened. The mean gradient has increased from 14.8 mmHg 03/2018 to 32.7 mmHg now. The aortic valve has been repaired/replaced. Aortic valve regurgitation is mild. Moderate aortic valve stenosis. There is a bioprosthetic valve present in the aortic position. Aortic valve area, by VTI measures 1.21 cm. Aortic valve mean gradient measures 32.7 mmHg. Aortic valve Vmax measures 3.96 m/s.  5. Aortic dilatation noted. There is mild dilatation of the ascending aorta.  6. The inferior vena cava is dilated in size with >50% respiratory variability, suggesting right atrial pressure of 8 mmHg. FINDINGS  Left Ventricle: Left ventricular ejection fraction, by estimation, is 60 to 65%. The left ventricle has normal function. There is moderate left ventricular hypertrophy. Right Ventricle: The right ventricular size is normal. No increase in right ventricular wall thickness. Right ventricular systolic function is normal. There is moderately elevated pulmonary artery systolic pressure. The tricuspid regurgitant velocity is 3.19 m/s, and with an assumed right atrial pressure of 8 mmHg, the estimated right ventricular systolic pressure is  90.2 mmHg. Mitral Valve: Moderate mitral annular calcification. Mild mitral valve regurgitation. Tricuspid Valve: Tricuspid valve regurgitation is trivial. Aortic Valve: Bioprosthetic aortic valve leaflets not well-visualized but appear thickened. The mean gradient has increased from 14.8 mmHg 03/2018 to 32.7 mmHg now. The aortic valve has been repaired/replaced. Aortic valve regurgitation is mild. Aortic regurgitation PHT measures 268 msec. Moderate aortic stenosis is present. Aortic valve mean gradient measures 32.7 mmHg. Aortic valve peak gradient measures 62.8 mmHg. Aortic valve area, by VTI measures 1.21 cm. There is a bioprosthetic valve present in  the aortic position. Aorta: Aortic dilatation noted. There is mild dilatation of the ascending aorta. Venous: The inferior vena cava is dilated in size with greater than 50% respiratory variability, suggesting right atrial pressure of 8 mmHg. LEFT VENTRICLE PLAX 2D LVIDd:         5.50 cm LVIDs:         3.60 cm LV PW:         1.40 cm LV IVS:        1.30 cm LVOT diam:     2.40 cm LV SV:         119 LV SV Index:   54 LVOT Area:     4.52 cm  IVC IVC diam: 2.20 cm LEFT [facility]         Index LA diam:    4.20 cm 1.91 cm/m  AORTIC VALVE AV Area (Vmax):    1.20 cm AV Area (Vmean):   1.36 cm AV Area (VTI):     1.21 cm AV Vmax:           396.33 cm/s AV Vmean:          267.000 cm/s AV VTI:            0.989 m AV Peak Grad:      62.8 mmHg AV Mean Grad:      32.7 mmHg LVOT Vmax:         105.00 cm/s LVOT Vmean:        80.200 cm/s LVOT VTI:          0.264 m LVOT/AV VTI ratio: 0.27 AI PHT:  268 msec  AORTA Ao Root diam: 3.60 cm Ao Asc diam:  3.80 cm TRICUSPID VALVE TR Peak grad:   40.7 mmHg TR Vmax:        319.00 cm/s  SHUNTS Systemic VTI:  0.26 m Systemic Diam: 2.40 cm Skeet Latch MD Electronically signed by Skeet Latch MD Signature Date/Time: 09/14/2019/5:55:36 AM    Final     Medications:   . amiodarone  100 mg Oral Daily  . amLODipine  10 mg Oral  Daily  . carvedilol  6.25 mg Oral BID  . Chlorhexidine Gluconate Cloth  6 each Topical Daily  . ezetimibe  10 mg Oral Daily  . hydrALAZINE  25 mg Oral BID  . levothyroxine  50 mcg Oral QAC breakfast  . multivitamin with minerals  1 tablet Oral Daily  . pantoprazole (PROTONIX) IV  40 mg Intravenous Q24H  . sodium chloride flush  10-40 mL Intracatheter Q12H   Continuous Infusions: . sodium chloride    . cefTRIAXone (ROCEPHIN)  IV 2 g (09/14/19 0900)  . [START ON 09/15/2019] DAPTOmycin (CUBICIN)  IV       LOS: 2 days   Geradine Girt  [facility]   How to contact the [facility] Attending or Consulting provider [place] or covering provider during after hours [place], for this patient?  1. Check the care team in [facility] and look for a) attending/consulting [facility] provider listed and b) the [facility] team listed 2. Log into [email or web address] and use [place]'s universal password to access. If you do not have the password, please contact the hospital operator. 3. Locate the [facility] provider you are looking for under [facility] and page to a number that you can be directly reached. 4. If you still have difficulty reaching the provider, please page the [facility] (Director on Call) for the Hospitalists listed on amion for assistance.  09/14/2019, 4:29 PM

## 2019-09-14 NOTE — Consult Note (Signed)
Cardiology Consultation:   Patient ID: Luis Herrera MRN: [id number]; DOB: [birth year]  Admit date: 09/12/2019 Date of Consult: 09/14/2019  Primary Care Provider: [name], MD [facility] Cardiologist: [name], MD   [place] Electrophysiologist:  None     Patient Profile:   Luis Herrera is a 79 y.o. male with a hx of knee replacement, with recent knee prosthetic infection, ,   aortic valve replacement, hypertension, coronary artery disease and recent surgery.  Who is being seen today for the evaluation of possible hemolytic anemia  at the request of Dr. Lucianne Lei.  History of Present Illness:   Luis Herrera is a 79 year old gentleman with a history of bioprosthetic aortic valve replacement.    He had a knee replacement with Dr. Ihor Gully in 2013.  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  He had debridement at a [facility]. He had knee surgery in [place]. He was having increased weakness and increased shortness of breath.  He was sent to an outpatient lab 2 days ago and his hemoglobin was found to be 5.5.  He was sent to the emergency room where his hemoglobin was 6.6.  His hemoglobin 1 week ago was 11.5.  Creatinine in the emergency room is 2.63 and is previous creatinine was 0.97.  He denies having any black or tarry stools.  He does have an aortic valve replacement and so there was some concern about hemolysis. LDH is mildly elevated.  He has been found to have a large hematoma in his left quad.  Limited echo tody shows  nromal LV EF.   Mild MR  Bioprosthetic AV with moderate AS  There is a mean AV gradient  of 32 mmHg.  mild AI,       Past Medical History:  Diagnosis Date  . Aortic stenosis 2008  . Arthritis   . Blood transfusion   . Blood transfusion without reported diagnosis   . CAD (coronary artery disease) 2008  . Cataract   . Complication of anesthesia    hallucinated once  . H/O hiatal hernia   . Heart  murmur    AFTER REPLACED VALVE WENT AWAY  . Hx of colonic polyps   . Hyperlipidemia   . Hypertension   . Thyroid disease     Past Surgical History:  Procedure Laterality Date  . AORTIC VALVE REPLACEMENT  2009   Bioprosthetic at [place]  2008  . CHOLECYSTECTOMY  1982  . COLONOSCOPY    . CORONARY ARTERY BYPASS GRAFT  2009   SVG-PDA w/ AVR  . CORRECTION HAMMER TOE  2011   BILATERAL  . 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 POLYPECTOMY    . TONSILLECTOMY    . TOTAL HIP ARTHROPLASTY Left 2000  . TOTAL KNEE ARTHROPLASTY  2000   left  . TOTAL KNEE ARTHROPLASTY  12/01/2011   Procedure: TOTAL KNEE ARTHROPLASTY;  Surgeon: Mauri Pole, MD;  Location: [facility];  Service: Orthopedics;  Laterality: Right;  . WEIL OSTEOTOMY  05/28/2011     Home Medications:  Prior to Admission medications   Medication Sig Start Date End Date Taking? Authorizing Provider  acetaminophen (TYLENOL) 500 MG tablet Take 1,000 mg by mouth as needed for moderate pain.   Yes [provider]  amiodarone (PACERONE) 200 MG tablet Take 0.5 tablets (100 mg total) by mouth daily. 02/20/19  Yes Evans Lance, MD  amLODipine-olmesartan Leotis Shames)  5-40 MG tablet Take 1 tablet by mouth daily.   Yes [provider]  aspirin 81 MG tablet Take 81 mg by mouth in the morning and at bedtime.    Yes [provider]  atorvastatin (LIPITOR) 80 MG tablet Take 1 tablet (80 mg total) by mouth daily at 6 PM. 02/20/19  Yes Evans Lance, MD  carvedilol (COREG) 6.25 MG tablet Take 1 tablet (6.25 mg total) by mouth 2 (two) times daily. 02/20/19  Yes Evans Lance, MD  ezetimibe (ZETIA) 10 MG tablet Take 1 tablet (10 mg total) by mouth daily. Patient taking differently: Take 40 mg by mouth daily.  02/20/19  Yes Evans Lance, MD  furosemide (LASIX) 40 MG tablet TAKE 1 TABLET BY MOUTH EVERY DAY . TAKE ADDITIONAL 40MG  AS NEEDED FOR SWELLING AND OR WEIGHT Patient  taking differently: Take 40 mg by mouth daily.  02/20/19  Yes Evans Lance, MD  hydrALAZINE (APRESOLINE) 25 MG tablet Take 1 tablet (25 mg total) by mouth 2 (two) times daily. 02/20/19  Yes Evans Lance, MD  levothyroxine (SYNTHROID) 50 MCG tablet Take 1 tablet (50 mcg total) by mouth daily before breakfast. 02/20/19  Yes Evans Lance, MD  Multiple Vitamin (MULTIVITAMIN WITH MINERALS) TABS Take 1 tablet by mouth daily.   Yes [provider]  olmesartan (BENICAR) 40 MG tablet Take 40 mg by mouth daily.   Yes [provider]  omeprazole (PRILOSEC) 40 MG capsule Take 40 mg by mouth daily. 09/05/19  Yes [provider]  oxyCODONE (OXY IR/ROXICODONE) 5 MG immediate release tablet Take 5 mg by mouth as needed for pain. 09/02/19  Yes [provider]  amLODipine-olmesartan (AZOR) 10-40 MG tablet Take 1 tablet by mouth daily. Patient not taking: Reported on 09/12/2019 02/20/19 02/15/20  Evans Lance, MD  ibuprofen (ADVIL,MOTRIN) 800 MG tablet TAKE 1 TABLET BY MOUTH EVERY 8 HOURS AS NEEDED Patient not taking: Reported on 09/12/2019 03/31/18   Edrick Kins, DPM  mupirocin ointment (BACTROBAN) 2 % Apply 1 application topically 2 (two) times daily. Apply to the affected area 2 times a day Patient not taking: Reported on 09/12/2019 07/11/19   Newt Minion, MD    Inpatient Medications: Scheduled Meds: . amiodarone  100 mg Oral Daily  . amLODipine  10 mg Oral Daily  . carvedilol  6.25 mg Oral BID  . Chlorhexidine Gluconate Cloth  6 each Topical Daily  . ezetimibe  10 mg Oral Daily  . hydrALAZINE  25 mg Oral BID  . levothyroxine  50 mcg Oral QAC breakfast  . multivitamin with minerals  1 tablet Oral Daily  . pantoprazole (PROTONIX) IV  40 mg Intravenous Q24H  . sodium chloride flush  10-40 mL Intracatheter Q12H   Continuous Infusions: . sodium chloride    . cefTRIAXone (ROCEPHIN)  IV 2 g (09/14/19 0900)  . [START ON 09/15/2019] DAPTOmycin (CUBICIN)  IV     PRN  Meds: acetaminophen **OR** acetaminophen, ondansetron **OR** ondansetron (ZOFRAN) IV, sodium chloride flush  Allergies:    Allergies  Allergen Reactions  . Sulfamethoxazole-Trimethoprim Other (See Comments)    Carlyn Reichert Syndrome    Social History:   Social History   Socioeconomic History  . Marital status: Married    Spouse name: Not on file  . Number of children: Not on file  . Years of education: Not on file  . Highest education level: Not on file  Occupational History  . Occupation: retired  Tobacco Use  . Smoking status: Former Smoker    Quit date: 05/25/1973    Years since quitting: 46.3  . Smokeless tobacco: Never Used  Vaping Use  . Vaping Use: Never used  Substance and Sexual Activity  . Alcohol use: Not Currently    Comment: rare  . Drug use: No  . Sexual activity: Not Currently  Other Topics Concern  . Not on file  Social History Narrative   Regular exercise-yes. Pt lives in [place] with his wife.   Social Determinants of Health   Financial Resource Strain:   . Difficulty of Paying Living Expenses:   Food Insecurity:   . Worried About Charity fundraiser in the Last Year:   . Arboriculturist in the Last Year:   Transportation Needs:   . Film/video editor (Medical):   Marland Kitchen Lack of Transportation (Non-Medical):   Physical Activity:   . Days of Exercise per Week:   . Minutes of Exercise per Session:   Stress:   . Feeling of Stress :   Social Connections:   . Frequency of Communication with Friends and Family:   . Frequency of Social Gatherings with Friends and Family:   . Attends Religious Services:   . Active Member of Clubs or Organizations:   . Attends Archivist Meetings:   Marland Kitchen Marital Status:   Intimate Partner Violence:   . Fear of Current or Ex-Partner:   . Emotionally Abused:   Marland Kitchen Physically Abused:   . Sexually Abused:     Family History:    Family History  Problem Relation Age of Onset  . Colon cancer  Mother 58  . Heart attack Father   . Hypertension Other   . Hyperlipidemia Other   . Heart attack Brother   . Hypertension Sister   . Hypertension Brother   . Stroke Neg Hx   . Esophageal cancer Neg Hx   . Rectal cancer Neg Hx   . Stomach cancer Neg Hx      ROS:  Please see the history of present illness.   All other ROS reviewed and negative.     Physical Exam/Data:   Vitals:   09/13/19 1716 09/14/19 0000 09/14/19 0550 09/14/19 1245  BP: (!) 135/52 (!) 132/43 (!) 143/46 (!) 130/47  Pulse: 77 73 72 77  Resp: 16 17 16 17   Temp: 98 F (36.7 C) 98.2 F (36.8 C) 98 F (36.7 C) 98.2 F (36.8 C)  TempSrc: Oral Oral Oral Oral  SpO2: 91% 91% 92% 92%  Weight:      Height:        Intake/Output Summary (Last 24 hours) at 09/14/2019 1711 Last data filed at 09/14/2019 0440 Gross per 24 hour  Intake 116 ml  Output 550 ml  Net -434 ml   Last 3 Weights 09/12/2019 07/11/2019 05/22/2019  Weight (lbs) 220 lb 235 lb 235 lb 12.8 oz  Weight (kg) 99.791 kg 106.595 kg 106.958 kg     Body mass index is 30.68 kg/m.  General:  Elderly male,  NAD  HEENT: normal Lymph: no adenopathy Neck: no JVD Endocrine:  No thryomegaly Vascular: No carotid bruits; FA pulses 2+ bilaterally without bruits  Cardiac:  normal S1, S2;  Soft systolic and soft diastolic murmur. Lungs:  clear to auscultation bilaterally, no wheezing, rhonchi or rales  Abd: soft, nontender, no hepatomegaly  Ext: left thigh is swollen ,  Large, firm hematoma  Musculoskeletal:  S/p left  leg surgery  Skin: warm and dry  Neuro:  CNs 2-12 intact, no focal abnormalities noted Psych:  Normal affect   EKG:  The EKG was personally reviewed and demonstrates:  Not done  Telemetry:  Telemetry was personally reviewed and demonstrates:    Relevant CV Studies:    Laboratory Data:  High Sensitivity Troponin:  No results for input(s): TROPONINIHS in the last 720 hours.   Chemistry Recent Labs  Lab 09/12/19 1240 September 19, 2019 0808  09/14/19 0423  NA 144 145 145  K 3.7 3.4* 3.5  CL 113* 113* 113*  CO2 23 21* 24  GLUCOSE 118* 90 111*  BUN 53* 56* 57*  CREATININE 2.63* 2.90* 2.92*  CALCIUM 7.9* 7.7* 7.7*  GFRNONAA 22* 20* 20*  GFRAA 26* 23* 23*  ANIONGAP 8 11 8     Recent Labs  Lab 09/12/19 1240 09-19-2019 0808  PROT 5.6* 5.2*  ALBUMIN 2.5* 2.4*  AST 60* 47*  ALT 30 26  ALKPHOS 52 50  BILITOT 1.5* 1.7*   Hematology Recent Labs  Lab 09/12/19 1240 09/12/19 1240 09/12/19 1637 Sep 19, 2019 0808 09/19/2019 2024 09/14/19 0423  WBC 15.8*  --   --   --   --  14.9*  RBC 2.03*  --  1.95*  --  2.46* 2.42*  HGB 6.3*   < >  --  7.1* 7.8* 7.7*  HCT 22.2*   < >  --  23.1* 24.8* 24.7*  MCV 109.4*  --   --   --   --  102.1*  MCH 31.0  --   --   --   --  31.8  MCHC 28.4*  --   --   --   --  31.2  RDW 19.6*  --   --   --   --  20.1*  PLT 314  --   --   --   --  265   < > = values in this interval not displayed.   BNPNo results for input(s): BNP, PROBNP in the last 168 hours.  DDimer No results for input(s): DDIMER in the last 168 hours.   Radiology/Studies:  US RENAL  Result Date: Sep 19, 2019 CLINICAL DATA:  Acute kidney injury EXAM: RENAL / URINARY TRACT ULTRASOUND COMPLETE COMPARISON:  None. FINDINGS: Right Kidney: Renal measurements: 11.8 x 5.7 x 3.7 cm = volume: 129.5 mL . Echogenicity and renal cortical thickness are within normal limits. No mass, perinephric fluid, or hydronephrosis visualized. No sonographically demonstrable calculus or ureterectasis. Left Kidney: Renal measurements: 11.3 x 5.3 x 3.6 cm = volume: 113.2 mL. Echogenicity and renal thickness are within normal limits. No mass, perinephric fluid, or hydronephrosis visualized. No sonographically demonstrable calculus or ureterectasis. Bladder: Appears normal for degree of bladder distention. Other: None. IMPRESSION: Study within normal limits. Electronically Signed   By: Lowella Grip III M.D.   On: 09/19/19 06:17   CT FEMUR LEFT WO  CONTRAST  Result Date: 09/19/19 CLINICAL DATA:  New soft tissue mass, spontaneous hemorrhage EXAM: CT OF THE LOWER LEFT EXTREMITY WITHOUT CONTRAST TECHNIQUE: Multidetector CT imaging of the lower left extremity was performed according to the standard protocol. COMPARISON:  None. 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 No periprosthetic lucency or fracture is identified. The joint spaces appear to be well maintained. There is complex hemorrhagic collections seen within the suprapatellar joint space extending superiorly. Ligaments Suboptimally assessed by CT. Muscles and Tendons Complex hyperdense hemorrhagic collection within the anterior  quadriceps musculature measuring approximately 9.7 x 4.8 by 18.9 cm. The collection contains small foci air is well and extends to the level of the mid femur. The remainder of the muscles appear to be intact. The quadriceps and patellar tendons are intact. Soft tissues There is diffuse subcutaneous edema and skin thickening seen surrounding the mid to lower femur. No subcutaneous emphysema within the soft tissues. Scattered vascular calcifications are noted. IMPRESSION: Large complex hemorrhagic collection extending in the suprapatellar joint space and within the quadriceps musculature to the level of the mid femur. There are small foci of air seen within the collection and this could represent infected hematoma. Diffuse subcutaneous edema and skin thickening which could be due to cellulitis. No subcutaneous emphysema. No acute osseous abnormality. Electronically Signed   By: Prudencio Pair M.D.   On: 09/13/2019 19:34   DG CHEST PORT 1 VIEW  Result Date: 09/13/2019 CLINICAL DATA:  PICC placement. EXAM: PORTABLE CHEST 1 VIEW COMPARISON:  04/28/2018 FINDINGS: New left-sided PICC has its tip projecting in the lower superior vena cava. Stable changes from previous cardiac surgery. No mediastinal or  hilar masses or evidence of adenopathy. Clear lungs.  No convincing pleural effusion or pneumothorax. Skeletal structures are grossly intact. IMPRESSION: 1. No acute cardiopulmonary disease. 2. Well-positioned left-sided PICC Electronically Signed   By: Lajean Manes M.D.   On: 09/13/2019 09:37   VAS [place] LOWER EXTREMITY VENOUS (DVT)  Result Date: 09/13/2019  Lower Venous DVTStudy Indications: Swelling.  Risk Factors: Surgery Per H&P note- recent remote LT knee replacement which became infected, was treated in [place] with washout, disc replacement, and antibiotics. Limitations: Poor ultrasound/tissue interface, swelling, bandages and body habitus. Comparison Study: No prior studies. Performing Technologist: Darlin Coco  Examination Guidelines: A complete evaluation includes B-mode imaging, spectral Doppler, color Doppler, and power Doppler as needed of all accessible portions of each vessel. Bilateral testing is considered an integral part of a complete examination. Limited examinations for reoccurring indications may be performed as noted. The reflux portion of the exam is performed with the patient in reverse Trendelenburg.  +-----+---------------+---------+-----------+----------+--------------+ RIGHTCompressibilityPhasicitySpontaneityPropertiesThrombus Aging +-----+---------------+---------+-----------+----------+--------------+ CFV  Full           Yes      Yes                                 +-----+---------------+---------+-----------+----------+--------------+   +---------+---------------+---------+-----------+----------------+-------------+ LEFT     CompressibilityPhasicitySpontaneityProperties      Thrombus                                                                  Aging         +---------+---------------+---------+-----------+----------------+-------------+ CFV      Full           Yes      Yes                                       +---------+---------------+---------+-----------+----------------+-------------+ SFJ      Full                                                             +---------+---------------+---------+-----------+----------------+-------------+  FV Prox  Full                                                             +---------+---------------+---------+-----------+----------------+-------------+ FV Mid                  Yes      Yes        Patent by color.                                                          Unable to                                                                 visualize                                                                 compression due                                                           to edema/tissue                                                           properties.                   +---------+---------------+---------+-----------+----------------+-------------+ FV DistalFull           Yes      Yes                                      +---------+---------------+---------+-----------+----------------+-------------+ PFV      Full                                                             +---------+---------------+---------+-----------+----------------+-------------+ POP      Full           Yes      Yes                                      +---------+---------------+---------+-----------+----------------+-------------+  PTV                     Yes      Yes        Patent by color.                                                          Unable to                                                                 visualize                                                                 compression due                                                           to edema/tissue                                                           properties.                    +---------+---------------+---------+-----------+----------------+-------------+ PERO                    Yes      Yes        Patent by color.                                                          Unable to                                                                 visualize                                                                 compression due  to edema/tissue                                                           properties.                   +---------+---------------+---------+-----------+----------------+-------------+     Summary: RIGHT: - No evidence of common femoral vein obstruction.  LEFT: - There is no evidence of deep vein thrombosis in the lower extremity. However, portions of this examination were limited- see technologist comments above. -Appearance of complex collection of unknown etiology in mid-thigh measuring 5.3 x 3.8 x 5.3 cm.  - A cystic structure is found in the popliteal fossa.  *See table(s) above for measurements and observations. Electronically signed by Curt Jews MD on 09/13/2019 at 5:17:14 PM.    Final    ECHOCARDIOGRAM LIMITED  Result Date: 09/14/2019    ECHOCARDIOGRAM LIMITED REPORT   Patient Name:   Luis Herrera Date of Exam: 09/13/2019 Medical Rec #:  [id number]    Height:       71.0 in Accession #:    [id number]   Weight:       220.0 lb Date of Birth:  1940/06/27     BSA:          2.196 m Patient Age:    70 years     BP:           123/42 mmHg Patient Gender: M            HR:           64 bpm. Exam Location:  Inpatient Procedure: Limited Echo, Cardiac Doppler and Color Doppler Indications:    Aortic valve disorder  History:        Patient has prior history of Echocardiogram examinations, most                 recent 04/13/2018. CAD, Aortic Valve Disease, Arrythmias:PVC;                 Risk Factors:Dyslipidemia and Hypertension. S/p aortic valve                  replacement.                 Aortic Valve: bioprosthetic valve is present in the aortic                 position.  Sonographer:    Clayton Lefort RDCS (AE) Referring Phys: [place]  1. Left ventricular ejection fraction, by estimation, is 60 to 65%. The left ventricle has normal function. There is moderate left ventricular hypertrophy.  2. Right ventricular systolic function is normal. The right ventricular size is normal. There is moderately elevated pulmonary artery systolic pressure.  3. Mild mitral valve regurgitation.  4. Bioprosthetic aortic valve leaflets not well-visualized but appear thickened. The mean gradient has increased from 14.8 mmHg 03/2018 to 32.7 mmHg now. The aortic valve has been repaired/replaced. Aortic valve regurgitation is mild. Moderate aortic valve stenosis. There is a bioprosthetic valve present in the aortic position. Aortic valve area, by VTI measures 1.21 cm. Aortic valve mean gradient measures 32.7 mmHg. Aortic valve Vmax measures 3.96 m/s.  5. Aortic dilatation noted. There is mild dilatation of the ascending aorta.  6. The inferior  vena cava is dilated in size with >50% respiratory variability, suggesting right atrial pressure of 8 mmHg. FINDINGS  Left Ventricle: Left ventricular ejection fraction, by estimation, is 60 to 65%. The left ventricle has normal function. There is moderate left ventricular hypertrophy. Right Ventricle: The right ventricular size is normal. No increase in right ventricular wall thickness. Right ventricular systolic function is normal. There is moderately elevated pulmonary artery systolic pressure. The tricuspid regurgitant velocity is 3.19 m/s, and with an assumed right atrial pressure of 8 mmHg, the estimated right ventricular systolic pressure is 25.3 mmHg. Mitral Valve: Moderate mitral annular calcification. Mild mitral valve regurgitation. Tricuspid Valve: Tricuspid valve regurgitation is trivial. Aortic Valve: Bioprosthetic aortic  valve leaflets not well-visualized but appear thickened. The mean gradient has increased from 14.8 mmHg 03/2018 to 32.7 mmHg now. The aortic valve has been repaired/replaced. Aortic valve regurgitation is mild. Aortic regurgitation PHT measures 268 msec. Moderate aortic stenosis is present. Aortic valve mean gradient measures 32.7 mmHg. Aortic valve peak gradient measures 62.8 mmHg. Aortic valve area, by VTI measures 1.21 cm. There is a bioprosthetic valve present in  the aortic position. Aorta: Aortic dilatation noted. There is mild dilatation of the ascending aorta. Venous: The inferior vena cava is dilated in size with greater than 50% respiratory variability, suggesting right atrial pressure of 8 mmHg. LEFT VENTRICLE PLAX 2D LVIDd:         5.50 cm LVIDs:         3.60 cm LV PW:         1.40 cm LV IVS:        1.30 cm LVOT diam:     2.40 cm LV SV:         119 LV SV Index:   54 LVOT Area:     4.52 cm  IVC IVC diam: 2.20 cm LEFT ATRIUM         Index LA diam:    4.20 cm 1.91 cm/m  AORTIC VALVE AV Area (Vmax):    1.20 cm AV Area (Vmean):   1.36 cm AV Area (VTI):     1.21 cm AV Vmax:           396.33 cm/s AV Vmean:          267.000 cm/s AV VTI:            0.989 m AV Peak Grad:      62.8 mmHg AV Mean Grad:      32.7 mmHg LVOT Vmax:         105.00 cm/s LVOT Vmean:        80.200 cm/s LVOT VTI:          0.264 m LVOT/AV VTI ratio: 0.27 AI PHT:            268 msec  AORTA Ao Root diam: 3.60 cm Ao Asc diam:  3.80 cm TRICUSPID VALVE TR Peak grad:   40.7 mmHg TR Vmax:        319.00 cm/s  SHUNTS Systemic VTI:  0.26 m Systemic Diam: 2.40 cm Skeet Latch MD Electronically signed by Skeet Latch MD Signature Date/Time: 09/14/2019/5:55:36 AM    Final    {    Assessment and Plan:   1. Anemia :   While there is some lab support to suggest hemolysis, I do not see high velocity or eccentric jets to suggest that hemolysis is occurring .  It addition, it is less likely with bioprosthetic valves  He has a soft murmur  but  it does not seem severe enough to be causing any significant amount of hemolysis   He has a hx of remote knee replacement and was fouind recently to have a prosthetic knee joint infection .  He has been found to have a large hematoma in his left leg .   I think his anemia is due to blood loss anemia   The LDH elevation and the low Haptoglobin are likely a result of reabsorption of the left leg hematoma   I would recommend using SCDs on his legs to avoid DVT.  If ortho  Once the wound is stable, he may even be able to take [facility] or low dose xarelto to prevent DVT .     [facility] will sign off.   Medication Recommendations:   Other recommendations (labs, testing, etc):   Follow up as an outpatient:   With Dr. Harrington Challenger and Dr. Lovena Le   For questions or updates, please contact [facility] Please consult [email or web address] for contact info under    Signed, Mertie Moores, MD  09/14/2019 5:11 PM

## 2019-09-15 LAB — BASIC METABOLIC PANEL
Anion gap: 10 (ref 5–15)
BUN: 55 mg/dL — ABNORMAL HIGH (ref 8–23)
CO2: 24 mmol/L (ref 22–32)
Calcium: 7.8 mg/dL — ABNORMAL LOW (ref 8.9–10.3)
Chloride: 111 mmol/L (ref 98–111)
Creatinine, Ser: 2.66 mg/dL — ABNORMAL HIGH (ref 0.61–1.24)
GFR calc Af Amer: 25 mL/min — ABNORMAL LOW (ref >60)
GFR calc non Af Amer: 22 mL/min — ABNORMAL LOW (ref >60)
Glucose, Bld: 99 mg/dL (ref 70–99)
Potassium: 3.5 mmol/L (ref 3.5–5.1)
Sodium: 145 mmol/L (ref 135–145)

## 2019-09-15 LAB — BPAM RBC
Blood Product Expiration Date: 202107292359
Blood Product Expiration Date: 202108082359
Blood Product Expiration Date: 202108112359
ISSUE DATE / TIME: 202107271723
ISSUE DATE / TIME: 202107272050
ISSUE DATE / TIME: 202107291821
Unit Type and Rh: 600
Unit Type and Rh: 600
Unit Type and Rh: 600

## 2019-09-15 LAB — CBC
HCT: 28 % — ABNORMAL LOW (ref 39.0–52.0)
Hemoglobin: 8.7 g/dL — ABNORMAL LOW (ref 13.0–17.0)
MCH: 30.9 pg (ref 26.0–34.0)
MCHC: 31.1 g/dL (ref 30.0–36.0)
MCV: 99.3 fL (ref 80.0–100.0)
Platelets: 264 10*3/uL (ref 150–400)
RBC: 2.82 MIL/uL — ABNORMAL LOW (ref 4.22–5.81)
RDW: 20.4 % — ABNORMAL HIGH (ref 11.5–15.5)
WBC: 14.9 10*3/uL — ABNORMAL HIGH (ref 4.0–10.5)
nRBC: 0 % (ref 0.0–0.2)

## 2019-09-15 LAB — TYPE AND SCREEN
ABO/RH(D): A NEG
Antibody Screen: NEGATIVE
Unit division: 0
Unit division: 0
Unit division: 0

## 2019-09-15 LAB — CREATININE, URINE, RANDOM: Creatinine, Urine: 103.73 mg/dL

## 2019-09-15 LAB — SODIUM, URINE, RANDOM: Sodium, Ur: 42 mmol/L

## 2019-09-15 MED ORDER — DOXYCYCLINE HYCLATE 100 MG PO TABS
100.0000 mg | ORAL_TABLET | Freq: Two times a day (BID) | ORAL | Status: DC
  Administered 2019-09-15 – 2019-09-19 (×9): 100 mg via ORAL
  Filled 2019-09-15 (×9): qty 1

## 2019-09-15 MED ORDER — FUROSEMIDE 10 MG/ML IJ SOLN
20.0000 mg | Freq: Once | INTRAMUSCULAR | Status: AC
  Administered 2019-09-15: 20 mg via INTRAVENOUS
  Filled 2019-09-15: qty 2

## 2019-09-15 NOTE — Progress Notes (Signed)
Progress Note    Luis Herrera  ZOX:[id number] DOB: [birth year]  DOA: 09/12/2019 PCP: [name], MD    Brief Narrative:     Medical records reviewed and are as summarized below:  Luis Herrera is an 79 y.o. male who was sent to the ER after outpatient labs done yesterday revealed a hemoglobin of 5.5. Today in the ER his hemoglobin is 6.3.  per visiting home nursing, the initial hemoglobin they collected 1 week ago was 11.5.  His creatinine today in the ER is 2.63.  Yesterday's creatinine was 1.2.  [place] initial creatinine was 0.97.  He was recently hospitalized in [place] with infection of the left knee.  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  He states he had a washout of the knee and the disc was replaced.  The cultures were negative, therefore, he was discharged with a PICC line on broad-spectrum antibiotics Zosyn and vancomycin.  He was discharged with aspirin 81 mg p.o. twice daily which is an increased dosage from once daily.  He was on meloxicam which was DC'd and he was started on oxycodone.  RECORDS FROM [facility]:  Labs: 7/16: 11.5  7/17: hgb: 8.9  7/19am 5.8/6.0 (got 2 units PRBC)-->  7.7     7/20 7.7  Cr: 7/17: 0.98   7/19: 1.35  7/20: 1.07 Final report from Microbiology: No growth (7/22)   Assessment/Plan:   Principal Problem:   Symptomatic anemia Active Problems:   Hyperlipidemia with target LDL less than 130   Essential hypertension   Aortic valve replaced   CAD (coronary artery disease)   Hypothyroidism   AKI (acute kidney injury) (HCC)   Anemia   Pressure injury of skin   Symptomatic Anemia -Hematoma in thigh vs hemolysis (LDH up and haptoglobin down) -patient is s/p 5 units since surgery (2 with [facility] and 2 here)- 1 unit given 7/29 with IV lasix -heme negative U/S shows complex collection of unknown substance: Appearance of complex collection of unknown etiology in mid-thigh  measuring 5.3 x 3.8 x 5.3 cm.  -CT  scan: Large complex hemorrhagic collection extending in the suprapatellar joint space and within the quadriceps musculature to the level of the mid femur. There are small foci of air seen within the collection and this could represent infected hematoma.  Dyspnea at rest -? Fluid overload -monitor closely -appreciate cards consult -h/o PE in father so may need to consider V/Q scan if breathing not better with IV lasix and transfusions, CR is such that CTA is not possible  AKI- most likely due to abx -renal u/s unrevealing -stopped IVF as he appeared to be getting volume overloaded -monitor closely -check FENA- await urine studies nephrology consult if not improving-- appears to have peaked and is now declining -strict I/Os -IV lasix x 1  Recent left knee debridement with disc replacement -was sent home with IV Abx and PICC Line -U/s with above results -left thigh: 70; right 54-- measure daily (await results) -reviewed [facility] -recommended 6 weeks IV abx (cultures with no growth) -ID consult appreciated  CAD (coronary artery disease)/frequent PVCs -Stable, home meds resumed except aspirin for now -Continue amiodarone, treatment for patient's frequent PVCs  aortic valve replacement -murmur most likely due to anemia -appreciate cardiology consult- doubt hemolysis or valve misfunction  Essential hypertension -stable, home meds resumed  Hyperlipidemia with target LDL less than 130 -Stable, home meds resumed   Hypothyroidism -Stable, home meds resumed  obesity  Body mass index is 30.68 kg/m.   Pressure Injury 09/13/19 Sacrum Stage 1 -  Intact skin with non-blanchable redness of a localized area usually over a bony prominence. (Active)  09/13/19 1248  Location: Sacrum  Location Orientation:   Staging: Stage 1 -  Intact skin with non-blanchable redness of a localized area usually over a bony prominence.  Wound Description (Comments):   Present on Admission:  Yes       Family Communication/Anticipated D/C date and plan/Code Status   DVT prophylaxis: scd Code Status: Full Code.  Family Communication: wife at beside 7/29 Disposition Plan: Status is: Inpatient  Remains inpatient appropriate because:Hemodynamically unstable   Dispo: The patient is from: Home              Anticipated d/c is to: Home              Anticipated d/c date is: > 3 days              Patient currently is not medically stable to d/c.         Medical Consultants:    Ortho  ID  Cardiology      Subjective:   Had a lot of urination with lasix yesterday  Objective:    Vitals:   09/14/19 2023 09/15/19 0544 09/15/19 0835 09/15/19 1300  BP: (!) 134/46 (!) 136/54 (!) 154/49 (!) 134/50  Pulse: 73 74 73 65  Resp: 18 18  18   Temp: 98.3 F (36.8 C) 97.8 F (36.6 C)  97.8 F (36.6 C)  TempSrc: Oral Oral  Oral  SpO2: 94% 93%  94%  Weight:      Height:        Intake/Output Summary (Last 24 hours) at 09/15/2019 1351 Last data filed at 09/15/2019 0844 Gross per 24 hour  Intake 675 ml  Output 900 ml  Net -225 ml   Filed Weights   09/12/19 1752  Weight: (!) 99.8 kg    Exam:   General: Appearance:    Obese male in no acute distress     Lungs:     Diminished, no wheezing, mild in crease work of breathing with speaking  Heart:    Normal heart rate. Normal rhythm. Murmur lessened in intensity today   MS:   All extremities are intact. Right leg still swollen but lessened firmness/edema in outer thigh  Neurologic:   Awake, alert, oriented x 3. No apparent focal neurological           defect.       Data Reviewed:   I have personally reviewed following labs and imaging studies:  Labs: Labs show the following:   Basic Metabolic Panel: Recent Labs  Lab 09/12/19 1240 09/12/19 1240 09/13/19 0808 09/13/19 0808 09/14/19 0423 09/15/19 0752  NA 144  --  145  --  145 145  K 3.7   < > 3.4*   < > 3.5 3.5  CL 113*  --  113*  --  113* 111    CO2 23  --  21*  --  24 24  GLUCOSE 118*  --  90  --  111* 99  BUN 53*  --  56*  --  57* 55*  CREATININE 2.63*  --  2.90*  --  2.92* 2.66*  CALCIUM 7.9*  --  7.7*  --  7.7* 7.8*   < > = values in this interval not displayed.   GFR Estimated Creatinine Clearance: 27.5 mL/min (A) (by C-G formula  based on SCr of 2.66 mg/dL (H)). Liver Function Tests: Recent Labs  Lab 09/12/19 1240 09/13/19 0808  AST 60* 47*  ALT 30 26  ALKPHOS 52 50  BILITOT 1.5* 1.7*  PROT 5.6* 5.2*  ALBUMIN 2.5* 2.4*   No results for input(s): LIPASE, AMYLASE in the last 168 hours. No results for input(s): AMMONIA in the last 168 hours. Coagulation profile Recent Labs  Lab 09/12/19 1707  INR 1.2    CBC: Recent Labs  Lab 09/12/19 1240 09/12/19 1240 09/13/19 0808 09/13/19 2024 09/14/19 0423 09/14/19 2300 09/15/19 0752  WBC 15.8*  --   --   --  14.9*  --  14.9*  HGB 6.3*   < > 7.1* 7.8* 7.7* 8.6* 8.7*  HCT 22.2*   < > 23.1* 24.8* 24.7* 27.8* 28.0*  MCV 109.4*  --   --   --  102.1*  --  99.3  PLT 314  --   --   --  265  --  264   < > = values in this interval not displayed.   Cardiac Enzymes: Recent Labs  Lab 09/14/19 0423  CKTOTAL 870*   BNP (last 3 results) Recent Labs    12/13/18 0832 05/22/19 1028  PROBNP 312 333   CBG: No results for input(s): GLUCAP in the last 168 hours. D-Dimer: No results for input(s): DDIMER in the last 72 hours. Hgb A1c: No results for input(s): HGBA1C in the last 72 hours. Lipid Profile: No results for input(s): CHOL, HDL, LDLCALC, TRIG, CHOLHDL, LDLDIRECT in the last 72 hours. Thyroid function studies: No results for input(s): TSH, T4TOTAL, T3FREE, THYROIDAB in the last 72 hours.  Invalid input(s): FREET3 Anemia work up: Recent Labs    09/12/19 1637 09/12/19 1707 09/13/19 2024  VITAMINB12  --  553  --   FOLATE 24.7  --   --   FERRITIN  --  211  --   TIBC  --  206*  --   IRON  --  43*  --   RETICCTPCT 8.4*  --  6.1*   Sepsis Labs: Recent  Labs  Lab 09/12/19 1240 09/14/19 0423 09/15/19 0752  WBC 15.8* 14.9* 14.9*    Microbiology Recent Results (from the past 240 hour(s))  SARS Coronavirus 2 by RT PCR (hospital order, performed in [facility] hospital lab) Nasopharyngeal Nasopharyngeal Swab     Status: None   Collection Time: 09/12/19  6:55 PM   Specimen: Nasopharyngeal Swab  Result Value Ref Range Status   SARS Coronavirus 2 NEGATIVE NEGATIVE Final    Comment: (NOTE) SARS-CoV-2 target nucleic acids are NOT DETECTED.  The SARS-CoV-2 RNA is generally detectable in upper and lower respiratory specimens during the acute phase of infection. The lowest concentration of SARS-CoV-2 viral copies this assay can detect is 250 copies / mL. A negative result does not preclude SARS-CoV-2 infection and should not be used as the sole basis for treatment or other patient management decisions.  A negative result may occur with improper specimen collection / handling, submission of specimen other than nasopharyngeal swab, presence of viral mutation(s) within the areas targeted by this assay, and inadequate number of viral copies (<250 copies / mL). A negative result must be combined with clinical observations, patient history, and epidemiological information.  Fact Sheet for Patients:   StrictlyIdeas.no  Fact Sheet for Healthcare Providers: BankingDealers.co.za  This test is not yet approved or  cleared by the [place] FDA and has been authorized for detection and/or diagnosis  of SARS-CoV-2 by FDA under an Emergency Use Authorization (EUA).  This EUA will remain in effect (meaning this test can be used) for the duration of the COVID-19 declaration under Section 564(b)(1) of the Act, 21 U.S.C. section 360bbb-3(b)(1), unless the authorization is terminated or revoked sooner.  Performed at [facility], [place] [address], [place], [place] [address]     Procedures and  diagnostic studies:  CT FEMUR LEFT WO CONTRAST  Result Date: 09/13/2019 CLINICAL DATA:  New soft tissue mass, spontaneous hemorrhage EXAM: CT OF THE LOWER LEFT EXTREMITY WITHOUT CONTRAST TECHNIQUE: Multidetector CT imaging of the lower left extremity was performed according to the standard protocol. COMPARISON:  None. 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 No periprosthetic lucency or fracture is identified. The joint spaces appear to be well maintained. There is complex hemorrhagic collections seen within the suprapatellar joint space extending superiorly. Ligaments Suboptimally assessed by CT. Muscles and Tendons Complex hyperdense hemorrhagic collection within the anterior quadriceps musculature measuring approximately 9.7 x 4.8 by 18.9 cm. The collection contains small foci air is well and extends to the level of the mid femur. The remainder of the muscles appear to be intact. The quadriceps and patellar tendons are intact. Soft tissues There is diffuse subcutaneous edema and skin thickening seen surrounding the mid to lower femur. No subcutaneous emphysema within the soft tissues. Scattered vascular calcifications are noted. IMPRESSION: Large complex hemorrhagic collection extending in the suprapatellar joint space and within the quadriceps musculature to the level of the mid femur. There are small foci of air seen within the collection and this could represent infected hematoma. Diffuse subcutaneous edema and skin thickening which could be due to cellulitis. No subcutaneous emphysema. No acute osseous abnormality. Electronically Signed   By: Prudencio Pair M.D.   On: 09/13/2019 19:34   DG CHEST PORT 1 VIEW  Result Date: 09/14/2019 CLINICAL DATA:  79 year old male with shortness of breath for 2 weeks. Left knee infection. Chronic heart valve replacement. EXAM: PORTABLE CHEST 1 VIEW COMPARISON:  Portable chest 09/13/2019 and  earlier. FINDINGS: Portable AP semi upright view at 1613 hours. Stable left upper extremity approach PICC line. Stable cardiac size and mediastinal contours. Prior sternotomy. Visualized tracheal air column is within normal limits. Stable to mildly decreased pulmonary vascularity from the portable film yesterday. Otherwise when allowing for portable technique the lungs are clear. Paucity of bowel gas in the upper abdomen. IMPRESSION: No acute cardiopulmonary abnormality. Electronically Signed   By: Genevie Ann M.D.   On: 09/14/2019 18:14   ECHOCARDIOGRAM LIMITED  Result Date: 09/14/2019    ECHOCARDIOGRAM LIMITED REPORT   Patient Name:   JOURDON ZIMMERLE Date of Exam: 09/13/2019 Medical Rec #:  [id number]    Height:       71.0 in Accession #:    [id number]   Weight:       220.0 lb Date of Birth:  Nov 13, 1940     BSA:          2.196 m Patient Age:    56 years     BP:           123/42 mmHg Patient Gender: M            HR:           64 bpm. Exam Location:  Inpatient Procedure: Limited Echo, Cardiac Doppler and Color Doppler Indications:    Aortic valve disorder  History:  Patient has prior history of Echocardiogram examinations, most                 recent 04/13/2018. CAD, Aortic Valve Disease, Arrythmias:PVC;                 Risk Factors:Dyslipidemia and Hypertension. S/p aortic valve                 replacement.                 Aortic Valve: bioprosthetic valve is present in the aortic                 position.  Sonographer:    Clayton Lefort RDCS (AE) Referring Phys: [place]  1. Left ventricular ejection fraction, by estimation, is 60 to 65%. The left ventricle has normal function. There is moderate left ventricular hypertrophy.  2. Right ventricular systolic function is normal. The right ventricular size is normal. There is moderately elevated pulmonary artery systolic pressure.  3. Mild mitral valve regurgitation.  4. Bioprosthetic aortic valve leaflets not well-visualized but appear thickened. The  mean gradient has increased from 14.8 mmHg 03/2018 to 32.7 mmHg now. The aortic valve has been repaired/replaced. Aortic valve regurgitation is mild. Moderate aortic valve stenosis. There is a bioprosthetic valve present in the aortic position. Aortic valve area, by VTI measures 1.21 cm. Aortic valve mean gradient measures 32.7 mmHg. Aortic valve Vmax measures 3.96 m/s.  5. Aortic dilatation noted. There is mild dilatation of the ascending aorta.  6. The inferior vena cava is dilated in size with >50% respiratory variability, suggesting right atrial pressure of 8 mmHg. FINDINGS  Left Ventricle: Left ventricular ejection fraction, by estimation, is 60 to 65%. The left ventricle has normal function. There is moderate left ventricular hypertrophy. Right Ventricle: The right ventricular size is normal. No increase in right ventricular wall thickness. Right ventricular systolic function is normal. There is moderately elevated pulmonary artery systolic pressure. The tricuspid regurgitant velocity is 3.19 m/s, and with an assumed right atrial pressure of 8 mmHg, the estimated right ventricular systolic pressure is 29.9 mmHg. Mitral Valve: Moderate mitral annular calcification. Mild mitral valve regurgitation. Tricuspid Valve: Tricuspid valve regurgitation is trivial. Aortic Valve: Bioprosthetic aortic valve leaflets not well-visualized but appear thickened. The mean gradient has increased from 14.8 mmHg 03/2018 to 32.7 mmHg now. The aortic valve has been repaired/replaced. Aortic valve regurgitation is mild. Aortic regurgitation PHT measures 268 msec. Moderate aortic stenosis is present. Aortic valve mean gradient measures 32.7 mmHg. Aortic valve peak gradient measures 62.8 mmHg. Aortic valve area, by VTI measures 1.21 cm. There is a bioprosthetic valve present in  the aortic position. Aorta: Aortic dilatation noted. There is mild dilatation of the ascending aorta. Venous: The inferior vena cava is dilated in size with  greater than 50% respiratory variability, suggesting right atrial pressure of 8 mmHg. LEFT VENTRICLE PLAX 2D LVIDd:         5.50 cm LVIDs:         3.60 cm LV PW:         1.40 cm LV IVS:        1.30 cm LVOT diam:     2.40 cm LV SV:         119 LV SV Index:   54 LVOT Area:     4.52 cm  IVC IVC diam: 2.20 cm LEFT [facility]         Index LA diam:  4.20 cm 1.91 cm/m  AORTIC VALVE AV Area (Vmax):    1.20 cm AV Area (Vmean):   1.36 cm AV Area (VTI):     1.21 cm AV Vmax:           396.33 cm/s AV Vmean:          267.000 cm/s AV VTI:            0.989 m AV Peak Grad:      62.8 mmHg AV Mean Grad:      32.7 mmHg LVOT Vmax:         105.00 cm/s LVOT Vmean:        80.200 cm/s LVOT VTI:          0.264 m LVOT/AV VTI ratio: 0.27 AI PHT:            268 msec  AORTA Ao Root diam: 3.60 cm Ao Asc diam:  3.80 cm TRICUSPID VALVE TR Peak grad:   40.7 mmHg TR Vmax:        319.00 cm/s  SHUNTS Systemic VTI:  0.26 m Systemic Diam: 2.40 cm Skeet Latch MD Electronically signed by Skeet Latch MD Signature Date/Time: 09/14/2019/5:55:36 AM    Final     Medications:    sodium chloride   Intravenous Once   amiodarone  100 mg Oral Daily   amLODipine  10 mg Oral Daily   carvedilol  6.25 mg Oral BID   Chlorhexidine Gluconate Cloth  6 each Topical Daily   doxycycline  100 mg Oral Q12H   ezetimibe  10 mg Oral Daily   hydrALAZINE  25 mg Oral BID   levothyroxine  50 mcg Oral QAC breakfast   multivitamin with minerals  1 tablet Oral Daily   pantoprazole (PROTONIX) IV  40 mg Intravenous Q24H   sodium chloride flush  10-40 mL Intracatheter Q12H   Continuous Infusions:  sodium chloride     cefTRIAXone (ROCEPHIN)  IV 2 g (09/15/19 0839)     LOS: 3 days   Geradine Girt  [facility]   How to contact the [facility] Attending or Consulting provider [place] or covering provider during after hours [place], for this patient?  1. Check the care team in [facility] and look for a) attending/consulting [facility] provider listed and  b) the [facility] team listed 2. Log into [email or web address] and use [place] universal password to access. If you do not have the password, please contact the hospital operator. 3. Locate the [facility] provider you are looking for under [facility] and page to a number that you can be directly reached. 4. If you still have difficulty reaching the provider, please page the [facility] (Director on Call) for the Hospitalists listed on amion for assistance.  09/15/2019, 1:51 PM

## 2019-09-15 NOTE — Progress Notes (Signed)
Physical Therapy Treatment Patient Details Name: Luis Herrera MRN: [id number] DOB: 06-03-1940 Today's Date: 09/15/2019    History of Present Illness Pt is a 79 y/o male admitted secondary to symptomatic anemia and found to have low hemoglobin. Ortho was consulted and found that pt has a large L thigh hematoma, no restrictions and recommending WBAT. PMH including but not limited to CAD, HTN, L TKA in 2000, R TKA in 2013.    PT Comments    Pt making steady progress with functional mobility as indicated by his ability to ambulate a further distance and participate in stair training this session. His R LE remains very edematous and pt is still unable to tolerate any flexion at the knee. Continue to recommend OPPT f/u services upon d/c. Pt would continue to benefit from skilled physical therapy services at this time while admitted and after d/c to address the below listed limitations in order to improve overall safety and independence with functional mobility.    Follow Up Recommendations  Outpatient PT     Equipment Recommendations  None recommended by PT    Recommendations for Other Services       Precautions / Restrictions Precautions Precautions: Fall Restrictions Weight Bearing Restrictions: Yes LLE Weight Bearing: Weight bearing as tolerated    Mobility  Bed Mobility Overal bed mobility: Needs Assistance Bed Mobility: Supine to Sit;Sit to Supine     Supine to sit: Supervision Sit to supine: Mod assist   General bed mobility comments: increased time and effort, HOB elevated, use of bed rails, assistance needed to return bilateral LEs onto bed  Transfers Overall transfer level: Needs assistance Equipment used: Rolling walker (2 wheeled) Transfers: Sit to/from Stand Sit to Stand: Min guard         General transfer comment: increased time, use of momentum, bed slightly elevated, min guard for safety, good technique utilized  Ambulation/Gait Ambulation/Gait assistance:  Counsellor (Feet): 100 Feet Assistive device: Rolling walker (2 wheeled) Gait Pattern/deviations: Step-to pattern;Decreased step length - right;Decreased stance time - left;Decreased stride length;Decreased weight shift to left;Antalgic Gait velocity: reduced   General Gait Details: pt with slow steady gait with use of RW; pt unable to tolerate any L knee flexion during swing phase, maintaining knee in full extension. No LOB or need for physical assistance. Limited secondary to pain   Stairs Stairs: Yes Stairs assistance: Min guard Stair Management: One rail Left;Step to pattern;Sideways Number of Stairs: 4 General stair comments: no instability noted, good technique utilized, min guard for safety   Wheelchair Mobility    Modified Rankin (Stroke Patients Only)       Balance Overall balance assessment: Needs assistance Sitting-balance support: Feet supported Sitting balance-Leahy Scale: Good     Standing balance support: During functional activity;Single extremity supported;Bilateral upper extremity supported Standing balance-Leahy Scale: Poor                              Cognition Arousal/Alertness: Awake/alert Behavior During Therapy: WFL for tasks assessed/performed Overall Cognitive Status: Within Functional Limits for tasks assessed                                        Exercises      General Comments        Pertinent Vitals/Pain Pain Assessment: No/denies pain    Home Living  Prior Function            PT Goals (current goals can now be found in the care plan section) Acute Rehab PT Goals PT Goal Formulation: With patient Time For Goal Achievement: 09/28/19 Potential to Achieve Goals: Good Progress towards PT goals: Progressing toward goals    Frequency    Min 3X/week      PT Plan Current plan remains appropriate    Co-evaluation              AM-PAC PT "6  Clicks" Mobility   Outcome Measure  Help needed turning from your back to your side while in a flat bed without using bedrails?: None Help needed moving from lying on your back to sitting on the side of a flat bed without using bedrails?: None Help needed moving to and from a bed to a chair (including a wheelchair)?: None Help needed standing up from a chair using your arms (e.g., wheelchair or bedside chair)?: None Help needed to walk in hospital room?: A Little Help needed climbing 3-5 steps with a railing? : A Little 6 Click Score: 22    End of Session Equipment Utilized During Treatment: Gait belt Activity Tolerance: Patient limited by fatigue Patient left: in bed;with call bell/phone within reach;Other (comment) (RN present) Nurse Communication: Mobility status PT Visit Diagnosis: Other abnormalities of gait and mobility (R26.89)     Time: 1030-1047 PT Time Calculation (min) (ACUTE ONLY): 17 min  Charges:  $Gait Training: 8-22 mins                     Anastasio Champion, DPT  Acute Rehabilitation Services Pager [phone] Office [place] 09/15/2019, 11:34 AM

## 2019-09-15 NOTE — TOC Initial Note (Addendum)
Transition of Care [facility]) - Initial/Assessment Note    Patient Details  Name: Luis Herrera MRN: [id number] Date of Birth: 02-22-1940  Transition of Care [facility]) CM/SW Contact:    Marilu Favre, RN Phone Number: 09/15/2019, 11:18 AM  Clinical Narrative:                  Patient from home with wife. Active with [facility] for IV ABX and they provide HHRN.   Have left a message for Emmit Pomfret with [facility]. Potential discharge Sunday or Monday. OPAT script is in. Sharrie Rothman does have Amber   Patient aslo active with OP PT at [facility].   Expected Discharge Plan: Home/Self Care     Patient Goals and CMS Choice Patient states their goals for this hospitalization and ongoing recovery are:: to return to home CMS Medicare.gov Compare Post Acute Care list provided to:: Patient Choice offered to / list presented to : Patient  Expected Discharge Plan and Services Expected Discharge Plan: Home/Self Care   Discharge Planning Services: CM Consult                     DME Arranged: N/A                    Prior Living Arrangements/Services   Lives with:: Spouse Patient language and need for interpreter reviewed:: Yes Do you feel safe going back to the place where you live?: Yes      Need for Family Participation in Patient Care: Yes (Comment) Care giver support system in place?: Yes (comment)   Criminal Activity/Legal Involvement Pertinent to Current Situation/Hospitalization: No - Comment as needed  Activities of Daily Living Home Assistive Devices/Equipment: Walker (specify type), Raised toilet seat with rails ADL Screening (condition at time of admission) Patient's cognitive ability adequate to safely complete daily activities?: Yes Is the patient deaf or have difficulty hearing?: No Does the patient have difficulty seeing, even when wearing glasses/contacts?: No Does the patient have difficulty concentrating, remembering, or making decisions?:  No Patient able to express need for assistance with ADLs?: Yes Does the patient have difficulty dressing or bathing?: No Independently performs ADLs?: No Communication: Independent Dressing (OT): Needs assistance Is this a change from baseline?: Pre-admission baseline Grooming: Independent Feeding: Independent Bathing: Needs assistance Is this a change from baseline?: Pre-admission baseline Toileting: Needs assistance Is this a change from baseline?: Pre-admission baseline In/Out Bed: Needs assistance Is this a change from baseline?: Pre-admission baseline Walks in Home: Independent with device (comment) Is this a change from baseline?: Pre-admission baseline Does the patient have difficulty walking or climbing stairs?: Yes Weakness of Legs: Left Weakness of Arms/Hands: None  Permission Sought/Granted   Permission granted to share information with : No              Emotional Assessment Appearance:: Appears stated age Attitude/Demeanor/Rapport: Engaged Affect (typically observed): Accepting Orientation: : Oriented to Self, Oriented to Place, Oriented to  Time, Oriented to Situation Alcohol / Substance Use: Not Applicable Psych Involvement: No (comment)  Admission diagnosis:  Anemia [D64.9] Symptomatic anemia [D64.9] Patient Active Problem List   Diagnosis Date Noted  . Pressure injury of skin 09/13/2019  . Symptomatic anemia 09/12/2019  . AKI (acute kidney injury) ([place]) 09/12/2019  . Anemia 09/12/2019  . Hypertensive urgency 04/13/2018  . Chest pain 04/13/2018  . UTI (urinary tract infection) 04/13/2018  . Hypothyroidism 04/13/2018  . Aortic valve stenosis 09/27/2017  . Right bundle branch block 09/27/2017  .  CAD (coronary artery disease) 09/27/2017  . 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 Acquired claw toe of right foot 06/04/2016  . Class 1 obesity with body mass index (BMI) of 31.0 to 31.9 in adult 12/02/2011  . 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 Back  pain of thoracolumbar region 09/01/2010  . Hyperlipidemia with target LDL less than 130 04/10/2010  . Essential hypertension 04/10/2010  . 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 [place] [address] Phone: [phone] Fax: [phone]     Social Determinants of Health (SDOH) Interventions    Readmission Risk Interventions No flowsheet data found.

## 2019-09-15 NOTE — Progress Notes (Signed)
PHARMACY CONSULT NOTE FOR:  OUTPATIENT  PARENTERAL ANTIBIOTIC THERAPY (OPAT)  Indication: Prosthetic joint infection Regimen: Ceftriaxone 2g IV q24h End date: 10/17/2019  IV antibiotic discharge orders are pended. To discharging provider:  please sign these orders via discharge navigator,  Select New Orders & click on the button choice - Manage This Unsigned Work.     Thank you for allowing pharmacy to be a part of this patient's care.  Phillis Haggis 09/15/2019, 10:50 AM

## 2019-09-15 NOTE — Progress Notes (Signed)
[facility] for Infectious Disease  Date of Admission:  09/12/2019     Total days of antibiotics 3         ASSESSMENT:  Mr. Lehnen renal function is improved today. CK levels were noted to be elevated and will discontinue Daptomycin and start doxycycline in combination with ceftriaxone for remainder of treatment. Will need to continue doxycycline and either Augmentin/Keflex after completion of IV therapy. He will follow up with orthopedic surgery here in [place] or return to [place]. Will arrange follow up in ID office and place new OPAT orders as he wishes to continue ID treatment here locally. [place] for discharge from ID standpoint.   PLAN:  1. Continue current dose of ceftriaxone and start doxycycline  2. End date of therapy of 8/31. 3. OPAT and Home Health orders placed.  4. Follow up in ID clinic.   Diagnosis: Prosthetic Knee Joint Infection  Culture Result: Culture negative.   Allergies  Allergen Reactions  . Sulfamethoxazole-Trimethoprim Other (See Comments)    Carlyn Reichert Syndrome    OPAT Orders Discharge antibiotics to be given via PICC line Discharge antibiotics: doxycycline and ceftriaxone Per pharmacy protocol   Duration: 4 weeks   End Date: 10/17/19  [facility] Care Per Protocol:  Home health RN for IV administration and teaching; PICC line care and labs.    Labs weekly while on IV antibiotics: _X_ CBC with differential _X_ BMP __ CMP _X_ CRP _X_ ESR  __ Please pull PIC at completion of IV antibiotics _X_ Please leave PIC in place until doctor has seen patient or been notified  Fax weekly labs to [phone]  Clinic Follow Up Appt:  8/31 at 9:45 am with Janene Madeira, NP   Principal Problem:   Symptomatic anemia Active Problems:   Hyperlipidemia with target LDL less than 130   Essential hypertension   Aortic valve replaced   CAD (coronary artery disease)   Hypothyroidism   AKI (acute kidney injury) ([place])   Anemia    Pressure injury of skin   . sodium chloride   Intravenous Once  . amiodarone  100 mg Oral Daily  . amLODipine  10 mg Oral Daily  . carvedilol  6.25 mg Oral BID  . Chlorhexidine Gluconate Cloth  6 each Topical Daily  . doxycycline  100 mg Oral Q12H  . ezetimibe  10 mg Oral Daily  . hydrALAZINE  25 mg Oral BID  . levothyroxine  50 mcg Oral QAC breakfast  . multivitamin with minerals  1 tablet Oral Daily  . pantoprazole (PROTONIX) IV  40 mg Intravenous Q24H  . sodium chloride flush  10-40 mL Intracatheter Q12H    SUBJECTIVE:  Afebrile overnight with no acute events. Previous cultures reviewed with no growth. Renal function improved today. Had elevated CK levels at baseline.   Allergies  Allergen Reactions  . Sulfamethoxazole-Trimethoprim Other (See Comments)    Carlyn Reichert Syndrome     Review of Systems: Review of Systems  Constitutional: Negative for chills, fever and weight loss.  Respiratory: Negative for cough, shortness of breath and wheezing.   Cardiovascular: Negative for chest pain and leg swelling.  Gastrointestinal: Negative for abdominal pain, constipation, diarrhea, nausea and vomiting.  Skin: Negative for rash.      OBJECTIVE: Vitals:   09/14/19 1843 09/14/19 2023 09/15/19 0544 09/15/19 0835  BP: (!) 134/47 (!) 134/46 (!) 136/54 (!) 154/49  Pulse: 67 73 74 73  Resp: _0 Temp: 98.2 F (36.8  C) 98.3 F (36.8 C) 97.8 F (36.6 C)   TempSrc: Oral Oral Oral   SpO2: 92% 94% 93%   Weight:      Height:       Body mass index is 30.68 kg/m.  Physical Exam Constitutional:      General: He is not in acute distress.    Appearance: He is well-developed.  Cardiovascular:     Rate and Rhythm: Normal rate and regular rhythm.     Heart sounds: Normal heart sounds.  Pulmonary:     Effort: Pulmonary effort is normal.     Breath sounds: Normal breath sounds.  Skin:    General: Skin is warm and dry.  Neurological:     Mental Status: He is alert and  oriented to person, place, and time.  Psychiatric:        Behavior: Behavior normal.        Thought Content: Thought content normal.        Judgment: Judgment normal.     Lab Results Lab Results  Component Value Date   WBC 14.9 (H) 09/15/2019   HGB 8.7 (L) 09/15/2019   HCT 28.0 (L) 09/15/2019   MCV 99.3 09/15/2019   PLT 264 09/15/2019    Lab Results  Component Value Date   CREATININE 2.66 (H) 09/15/2019   BUN 55 (H) 09/15/2019   NA 145 09/15/2019   K 3.5 09/15/2019   CL 111 09/15/2019   CO2 24 09/15/2019    Lab Results  Component Value Date   ALT 26 09/13/2019   AST 47 (H) 09/13/2019   ALKPHOS 50 09/13/2019   BILITOT 1.7 (H) 09/13/2019     Microbiology: Recent Results (from the past 240 hour(s))  SARS Coronavirus 2 by RT PCR (hospital order, performed in [facility] hospital lab) Nasopharyngeal Nasopharyngeal Swab     Status: None   Collection Time: 09/12/19  6:55 PM   Specimen: Nasopharyngeal Swab  Result Value Ref Range Status   SARS Coronavirus 2 NEGATIVE NEGATIVE Final    Comment: (NOTE) SARS-CoV-2 target nucleic acids are NOT DETECTED.  The SARS-CoV-2 RNA is generally detectable in upper and lower respiratory specimens during the acute phase of infection. The lowest concentration of SARS-CoV-2 viral copies this assay can detect is 250 copies / mL. A negative result does not preclude SARS-CoV-2 infection and should not be used as the sole basis for treatment or other patient management decisions.  A negative result may occur with improper specimen collection / handling, submission of specimen other than nasopharyngeal swab, presence of viral mutation(s) within the areas targeted by this assay, and inadequate number of viral copies (<250 copies / mL). A negative result must be combined with clinical observations, patient history, and epidemiological information.  Fact Sheet for Patients:   StrictlyIdeas.no  Fact Sheet for  Healthcare Providers: BankingDealers.co.za  This test is not yet approved or  cleared by the [place] FDA and has been authorized for detection and/or diagnosis of SARS-CoV-2 by FDA under an Emergency Use Authorization (EUA).  This EUA will remain in effect (meaning this test can be used) for the duration of the COVID-19 declaration under Section 564(b)(1) of the Act, 21 U.S.C. section 360bbb-3(b)(1), unless the authorization is terminated or revoked sooner.  Performed at [facility], [place] [address], [place], [place] [address]      Terri Piedra, [place] [facility] for Infectious Disease [facility]  09/15/2019  10:46 AM

## 2019-09-15 NOTE — Progress Notes (Signed)
Right thigh measuring 51cm and left thigh measuring 65cm.

## 2019-09-16 LAB — BASIC METABOLIC PANEL
Anion gap: 9 (ref 5–15)
BUN: 56 mg/dL — ABNORMAL HIGH (ref 8–23)
CO2: 24 mmol/L (ref 22–32)
Calcium: 7.7 mg/dL — ABNORMAL LOW (ref 8.9–10.3)
Chloride: 114 mmol/L — ABNORMAL HIGH (ref 98–111)
Creatinine, Ser: 2.8 mg/dL — ABNORMAL HIGH (ref 0.61–1.24)
GFR calc Af Amer: 24 mL/min — ABNORMAL LOW (ref >60)
GFR calc non Af Amer: 21 mL/min — ABNORMAL LOW (ref >60)
Glucose, Bld: 105 mg/dL — ABNORMAL HIGH (ref 70–99)
Potassium: 3.5 mmol/L (ref 3.5–5.1)
Sodium: 147 mmol/L — ABNORMAL HIGH (ref 135–145)

## 2019-09-16 LAB — CBC
HCT: 27.8 % — ABNORMAL LOW (ref 39.0–52.0)
Hemoglobin: 8.5 g/dL — ABNORMAL LOW (ref 13.0–17.0)
MCH: 31.3 pg (ref 26.0–34.0)
MCHC: 30.6 g/dL (ref 30.0–36.0)
MCV: 102.2 fL — ABNORMAL HIGH (ref 80.0–100.0)
Platelets: 242 10*3/uL (ref 150–400)
RBC: 2.72 MIL/uL — ABNORMAL LOW (ref 4.22–5.81)
RDW: 20 % — ABNORMAL HIGH (ref 11.5–15.5)
WBC: 12.8 10*3/uL — ABNORMAL HIGH (ref 4.0–10.5)
nRBC: 0 % (ref 0.0–0.2)

## 2019-09-16 LAB — PROTEIN / CREATININE RATIO, URINE
Creatinine, Urine: 109.8 mg/dL
Protein Creatinine Ratio: 0.68 mg/mg{Cre} — ABNORMAL HIGH (ref 0.00–0.15)
Total Protein, Urine: 75 mg/dL

## 2019-09-16 LAB — VANCOMYCIN, RANDOM: Vancomycin Rm: 12

## 2019-09-16 MED ORDER — IPRATROPIUM-ALBUTEROL 0.5-2.5 (3) MG/3ML IN SOLN
3.0000 mL | RESPIRATORY_TRACT | Status: DC | PRN
  Administered 2019-09-16 – 2019-09-17 (×2): 3 mL via RESPIRATORY_TRACT
  Filled 2019-09-16 (×2): qty 3

## 2019-09-16 MED ORDER — DEXTROSE 5 % IV SOLN: INTRAVENOUS | Status: AC

## 2019-09-16 NOTE — Progress Notes (Signed)
Progress Note    Luis Herrera  KPT:[id number] DOB: 1940-08-14  DOA: 09/12/2019 PCP: [name], MD    Brief Narrative:     Medical records reviewed and are as summarized below:  Luis Herrera is an 79 y.o. male who was sent to the ER after outpatient labs done yesterday revealed a hemoglobin of 5.5. Today in the ER his hemoglobin is 6.3.  the initial hemoglobin they collected 1 week ago was 11.5.  His creatinine today in the ER is 2.63.  Yesterday's creatinine was 1.2.  [place] initial creatinine was 0.97.  He was recently hospitalized in [place] with infection of the left knee.  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  He states he had a washout of the knee and the disc was replaced.  The cultures were negative, therefore, he was discharged with a PICC line on broad-spectrum antibiotics Zosyn and vancomycin.  He was discharged with aspirin 81 mg p.o. twice daily which is an increased dosage from once daily.  He was on meloxicam which was DC'd and he was started on oxycodone.  RECORDS FROM [facility]:  Labs: 7/16: 11.5  7/17: hgb: 8.9  7/19am 5.8/6.0 (got 2 units PRBC)-->  7.7     7/20 7.7  Cr: 7/17: 0.98   7/19: 1.35  7/20: 1.07 Final report from Microbiology: No growth (7/22)   Assessment/Plan:   Principal Problem:   Symptomatic anemia Active Problems:   Hyperlipidemia with target LDL less than 130   Essential hypertension   Aortic valve replaced   CAD (coronary artery disease)   Hypothyroidism   AKI (acute kidney injury) ([facility])   Anemia   Pressure injury of skin   Symptomatic Anemia -from Hematoma in thigh  -patient is s/p 5 units since surgery (2 with [facility] and 2 here)- 1 unit given 7/29 with IV lasix -heme negative U/S shows complex collection of unknown substance: Appearance of complex collection of unknown etiology in mid-thigh  measuring 5.3 x 3.8 x 5.3 cm.  -CT scan: Large complex hemorrhagic collection extending in the  suprapatellar joint space and within the quadriceps musculature to the level of the mid femur. There are small foci of air seen within the collection and this could represent infected hematoma. -has been seem by orthopedics here and will follow up in [place]  Dyspnea at rest -will do trial of nebs -monitor closely -appreciate cards consult -h/o PE in father so may need to consider V/Q scan if breathing not better, CR is such that CTA is not possible  AKI- most likely due to abx/and other meds -renal u/s unrevealing -monitor closely -check FENA- pre renal?? nephrology consult  Recent left knee debridement with disc replacement -was sent home with IV Abx and PICC Line -U/s with above results -left thigh: 70; right 54-- not enlarging -reviewed [facility] from [facility] -recommended 6 weeks IV abx (cultures with no growth) -ID consult appreciated- medications changed  CAD (coronary artery disease)/frequent PVCs -Stable, home meds resumed except aspirin for now -Continue amiodarone, treatment for patient's frequent PVCs  aortic valve replacement -murmur most likely due to anemia -appreciate cardiology consult- doubt hemolysis or valve misfunction  Essential hypertension -stable, home meds resumed  Hyperlipidemia with target LDL less than 130 -Stable, home meds resumed   Hypothyroidism -Stable, home meds resumed  obesity Body mass index is 30.68 kg/m.   Pressure Injury 09/13/19 Sacrum Stage 1 -  Intact skin with non-blanchable redness of a localized area usually  over a bony prominence. (Active)  09/13/19 1248  Location: Sacrum  Location Orientation:   Staging: Stage 1 -  Intact skin with non-blanchable redness of a localized area usually over a bony prominence.  Wound Description (Comments):   Present on Admission: Yes       Family Communication/Anticipated D/C date and plan/Code Status   DVT prophylaxis: scd Code Status: Full Code.  Family Communication: wife  at beside 7/31 Disposition Plan: Status is: Inpatient  Remains inpatient appropriate because:Hemodynamically unstable   Dispo: The patient is from: Home              Anticipated d/c is to: Home              Anticipated d/c date is: > 3 days              Patient currently is not medically stable to d/c.         Medical Consultants:    Ortho  ID  Cardiology      Subjective:   Using incentive spirometry-- feels like he can take a deep breath now  Objective:    Vitals:   09/15/19 2301 09/16/19 0051 09/16/19 0608 09/16/19 1202  BP: (!) 146/54 (!) 133/46 (!) 137/49 (!) 148/50  Pulse: 76 75 74 67  Resp: 16 16 16 16   Temp: 98 F (36.7 C) 98.1 F (36.7 C) 97.6 F (36.4 C) 97.9 F (36.6 C)  TempSrc: Oral Oral Oral Oral  SpO2: 96% 94% 94% 96%  Weight:      Height:        Intake/Output Summary (Last 24 hours) at 09/16/2019 1557 Last data filed at 09/16/2019 1500 Gross per 24 hour  Intake 240 ml  Output 1575 ml  Net -1335 ml   Filed Weights   09/12/19 1752  Weight: (!) 99.8 kg    Exam:    General: Appearance:    Obese male in no acute distress     Lungs:     Lungs clear but diminished at bases  Heart:    Normal heart rate. Normal rhythm.    MS:   All extremities are intact. Swelling improve in left thigh-- measures 58  Neurologic:   Awake, alert, oriented x 3. No apparent focal neurological           defect.       Data Reviewed:   I have personally reviewed following labs and imaging studies:  Labs: Labs show the following:   Basic Metabolic Panel: Recent Labs  Lab 09/12/19 1240 09/12/19 1240 09/13/19 0808 09/13/19 0808 09/14/19 0423 09/14/19 0423 09/15/19 0752 09/16/19 0415  NA 144  --  145  --  145  --  145 147*  K 3.7   < > 3.4*   < > 3.5   < > 3.5 3.5  CL 113*  --  113*  --  113*  --  111 114*  CO2 23  --  21*  --  24  --  24 24  GLUCOSE 118*  --  90  --  111*  --  99 105*  BUN 53*  --  56*  --  57*  --  55* 56*  CREATININE  2.63*  --  2.90*  --  2.92*  --  2.66* 2.80*  CALCIUM 7.9*  --  7.7*  --  7.7*  --  7.8* 7.7*   < > = values in this interval not displayed.   GFR Estimated Creatinine Clearance:  26.2 mL/min (A) (by C-G formula based on SCr of 2.8 mg/dL (H)). Liver Function Tests: Recent Labs  Lab 09/12/19 1240 09/13/19 0808  AST 60* 47*  ALT 30 26  ALKPHOS 52 50  BILITOT 1.5* 1.7*  PROT 5.6* 5.2*  ALBUMIN 2.5* 2.4*   No results for input(s): LIPASE, AMYLASE in the last 168 hours. No results for input(s): AMMONIA in the last 168 hours. Coagulation profile Recent Labs  Lab 09/12/19 1707  INR 1.2    CBC: Recent Labs  Lab 09/12/19 1240 09/13/19 0808 09/13/19 2024 09/14/19 0423 09/14/19 2300 09/15/19 0752 09/16/19 0415  WBC 15.8*  --   --  14.9*  --  14.9* 12.8*  HGB 6.3*   < > 7.8* 7.7* 8.6* 8.7* 8.5*  HCT 22.2*   < > 24.8* 24.7* 27.8* 28.0* 27.8*  MCV 109.4*  --   --  102.1*  --  99.3 102.2*  PLT 314  --   --  265  --  264 242   < > = values in this interval not displayed.   Cardiac Enzymes: Recent Labs  Lab 09/14/19 0423  CKTOTAL 870*   BNP (last 3 results) Recent Labs    12/13/18 0832 05/22/19 1028  PROBNP 312 333   CBG: No results for input(s): GLUCAP in the last 168 hours. D-Dimer: No results for input(s): DDIMER in the last 72 hours. Hgb A1c: No results for input(s): HGBA1C in the last 72 hours. Lipid Profile: No results for input(s): CHOL, HDL, LDLCALC, TRIG, CHOLHDL, LDLDIRECT in the last 72 hours. Thyroid function studies: No results for input(s): TSH, T4TOTAL, T3FREE, THYROIDAB in the last 72 hours.  Invalid input(s): FREET3 Anemia work up: Recent Labs    09/13/19 2024  RETICCTPCT 6.1*   Sepsis Labs: Recent Labs  Lab 09/12/19 1240 09/14/19 0423 09/15/19 0752 09/16/19 0415  WBC 15.8* 14.9* 14.9* 12.8*    Microbiology Recent Results (from the past 240 hour(s))  SARS Coronavirus 2 by RT PCR (hospital order, performed in [facility] hospital  lab) Nasopharyngeal Nasopharyngeal Swab     Status: None   Collection Time: 09/12/19  6:55 PM   Specimen: Nasopharyngeal Swab  Result Value Ref Range Status   SARS Coronavirus 2 NEGATIVE NEGATIVE Final    Comment: (NOTE) SARS-CoV-2 target nucleic acids are NOT DETECTED.  The SARS-CoV-2 RNA is generally detectable in upper and lower respiratory specimens during the acute phase of infection. The lowest concentration of SARS-CoV-2 viral copies this assay can detect is 250 copies / mL. A negative result does not preclude SARS-CoV-2 infection and should not be used as the sole basis for treatment or other patient management decisions.  A negative result may occur with improper specimen collection / handling, submission of specimen other than nasopharyngeal swab, presence of viral mutation(s) within the areas targeted by this assay, and inadequate number of viral copies (<250 copies / mL). A negative result must be combined with clinical observations, patient history, and epidemiological information.  Fact Sheet for Patients:   StrictlyIdeas.no  Fact Sheet for Healthcare Providers: BankingDealers.co.za  This test is not yet approved or  cleared by the [place] FDA and has been authorized for detection and/or diagnosis of SARS-CoV-2 by FDA under an Emergency Use Authorization (EUA).  This EUA will remain in effect (meaning this test can be used) for the duration of the COVID-19 declaration under Section 564(b)(1) of the Act, 21 U.S.C. section 360bbb-3(b)(1), unless the authorization is terminated or revoked sooner.  Performed  at [facility], [place] [address] [place], [place] [address]     Procedures and diagnostic studies:  DG CHEST PORT 1 VIEW  Result Date: 09/14/2019 CLINICAL DATA:  79 year old male with shortness of breath for 2 weeks. Left knee infection. Chronic heart valve replacement. EXAM: PORTABLE CHEST 1 VIEW  COMPARISON:  Portable chest 09/13/2019 and earlier. FINDINGS: Portable AP semi upright view at 1613 hours. Stable left upper extremity approach PICC line. Stable cardiac size and mediastinal contours. Prior sternotomy. Visualized tracheal air column is within normal limits. Stable to mildly decreased pulmonary vascularity from the portable film yesterday. Otherwise when allowing for portable technique the lungs are clear. Paucity of bowel gas in the upper abdomen. IMPRESSION: No acute cardiopulmonary abnormality. Electronically Signed   By: Genevie Ann M.D.   On: 09/14/2019 18:14    Medications:   . amiodarone  100 mg Oral Daily  . amLODipine  10 mg Oral Daily  . carvedilol  6.25 mg Oral BID  . Chlorhexidine Gluconate Cloth  6 each Topical Daily  . doxycycline  100 mg Oral Q12H  . ezetimibe  10 mg Oral Daily  . hydrALAZINE  25 mg Oral BID  . levothyroxine  50 mcg Oral QAC breakfast  . multivitamin with minerals  1 tablet Oral Daily  . pantoprazole (PROTONIX) IV  40 mg Intravenous Q24H  . sodium chloride flush  10-40 mL Intracatheter Q12H   Continuous Infusions: . sodium chloride    . cefTRIAXone (ROCEPHIN)  IV 2 g (09/16/19 0958)  . dextrose 50 mL/hr at 09/16/19 1205     LOS: 4 days   Geradine Girt  [facility]   How to contact the [facility] Attending or Consulting provider [place] or covering provider during after hours [place], for this patient?  1. Check the care team in [facility] and look for a) attending/consulting TRH provider listed and b) the [facility] team listed 2. Log into [email or web address] and use [place]'s universal password to access. If you do not have the password, please contact the hospital operator. 3. Locate the [facility] provider you are looking for under [facility] and page to a number that you can be directly reached. 4. If you still have difficulty reaching the provider, please page the [facility] (Director on Call) for the Hospitalists listed on amion for  assistance.  09/16/2019, 3:57 PM

## 2019-09-16 NOTE — Consult Note (Signed)
[facility] Renal Consultation Note  Requesting MD: Eulogio Bear, DO Indication for Consultation:  AKI   Chief complaint: knee pain and blood loss  HPI:  Luis Herrera is a 79 y.o. male with a history including coronary artery disease, hypertension, and aortic stenosis s/p aortic valve replacement who presented to the hospital with profound anemia.  Outpatient labs which demonstrated a hemoglobin of 5.1 directed to the ER.  For reference, labs a week prior were hemoglobin 11.5 per charting.  He has had a recent left knee infection treated at an outside facility and is s/p recent washout of joint and disc replacement.  He was discharged on Vanco and Zosyn through PICC line.  He was previously on meloxicam and had been using ibuprofen at home but not much after taking three tabs the night of OSH discharge.  States that his left leg had been larger and tight and now feels softer and smaller - teams are monitoring circumference.  Large complex hemorrhagic collection in leg per CT scan with concern for possible infected hematoma.  Nephrology is consulted for AKI.  Creatinine on presentation to ER was 2.63 on 7/27.  Note baseline Cr is 1 or less.   No vanc levels are available.  Note duplicate ARB's are reported however he has indicated to pharmacy that he is not taking amlodipine-olmesartan.  Has been on nonsteroidals.  Has been on Lasix daily as well as ARB daily.  covid screen negative.  He got irbesartan here on 7/28.  Has had intermittent low dose IV lasix.  Has had some shortness of breath when walking but none at rest.  He had 1.1 liters UOP and 4 unmeasured voids on 7/30.  Creat  Date/Time Value Ref Range Status  06/07/2015 02:19 PM 0.90 0.70 - 1.18 mg/dL Final  03/26/2015 03:20 PM 0.82 0.70 - 1.18 mg/dL Final   Creatinine, Ser  Date/Time Value Ref Range Status  09/16/2019 04:15 AM 2.80 (H) 0.61 - 1.24 mg/dL Final  09/15/2019 07:52 AM 2.66 (H) 0.61 - 1.24 mg/dL Final  09/14/2019  04:23 AM 2.92 (H) 0.61 - 1.24 mg/dL Final  09/13/2019 08:08 AM 2.90 (H) 0.61 - 1.24 mg/dL Final  09/12/2019 12:40 PM 2.63 (H) 0.61 - 1.24 mg/dL Final  05/22/2019 10:28 AM 0.87 0.76 - 1.27 mg/dL Final  12/13/2018 08:32 AM 1.05 0.76 - 1.27 mg/dL Final  06/14/2018 02:18 PM 1.32 (H) 0.76 - 1.27 mg/dL Final  04/28/2018 07:48 AM 1.18 0.76 - 1.27 mg/dL Final  04/14/2018 01:44 AM 1.16 0.61 - 1.24 mg/dL Final  04/13/2018 01:23 AM 1.05 0.61 - 1.24 mg/dL Final  08/10/2017 01:22 PM 1.15 0.76 - 1.27 mg/dL Final  10/08/2016 11:19 AM 1.11 0.76 - 1.27 mg/dL Final  11/14/2015 09:54 AM 0.91 0.40 - 1.50 mg/dL Final  09/24/2014 11:19 AM 0.90 0.40 - 1.50 mg/dL Final  12/25/2013 12:53 PM 0.9 0.40 - 1.50 mg/dL Final  08/14/2013 11:04 AM 1.0 0.40 - 1.50 mg/dL Final  08/03/2013 05:30 AM 1.36 (H) 0.50 - 1.35 mg/dL Final    Comment:    DELTA CHECK NOTED REPEATED TO VERIFY  08/02/2013 01:37 PM 2.19 (H) 0.50 - 1.35 mg/dL Final  06/26/2013 08:33 AM 1.1 0.40 - 1.50 mg/dL Final  04/18/2012 02:25 PM 1.31 0.50 - 1.35 mg/dL Final  03/29/2012 03:30 PM 0.90 0.50 - 1.35 mg/dL Final  12/03/2011 03:58 AM 0.81 0.50 - 1.35 mg/dL Final  12/02/2011 03:45 AM 0.82 0.50 - 1.35 mg/dL Final  11/25/2011 09:30 AM 0.89 0.50 - 1.35 mg/dL  Final  10/21/2011 01:30 PM 0.92 0.50 - 1.35 mg/dL Final  06/29/2011 09:52 AM 0.9 0.40 - 1.50 mg/dL Final  05/26/2011 12:30 PM 1.07 0.50 - 1.35 mg/dL Final  09/01/2010 09:41 AM 1.1 0.40 - 1.50 mg/dL Final     PMHx:   Past Medical History:  Diagnosis Date  . Aortic stenosis 2008  . Arthritis   . Blood transfusion   . Blood transfusion without reported diagnosis   . CAD (coronary artery disease) 2008  . Cataract   . Complication of anesthesia    hallucinated once  . H/O hiatal hernia   . 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 Hx of colonic polyps   . Hyperlipidemia   . Hypertension   . Thyroid disease     Past Surgical History:  Procedure Laterality Date  . AORTIC VALVE  REPLACEMENT  2009   Bioprosthetic at [place]  2008  . CHOLECYSTECTOMY  1982  . COLONOSCOPY    . CORONARY ARTERY BYPASS GRAFT  2009   SVG-PDA w/ AVR  . CORRECTION HAMMER TOE  2011   BILATERAL  . 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 POLYPECTOMY    . TONSILLECTOMY    . TOTAL HIP ARTHROPLASTY Left 2000  . TOTAL KNEE ARTHROPLASTY  2000   left  . TOTAL KNEE ARTHROPLASTY  12/01/2011   Procedure: TOTAL KNEE ARTHROPLASTY;  Surgeon: Mauri Pole, MD;  Location: [facility];  Service: Orthopedics;  Laterality: Right;  . WEIL OSTEOTOMY  05/28/2011    Family Hx:  Family History  Problem Relation Age of Onset  . Colon cancer Mother 88  . Heart attack Father   . Hypertension Other   . Hyperlipidemia Other   . Heart attack Brother   . Hypertension Sister   . Hypertension Brother   . Stroke Neg Hx   . Esophageal cancer Neg Hx   . Rectal cancer Neg Hx   . Stomach cancer Neg Hx   No family history of ESRD or CKD  Social History:  reports that he quit smoking about 46 years ago. He has never used smokeless tobacco. He reports previous alcohol use. He reports that he does not use drugs.  Allergies:  Allergies  Allergen Reactions  . Sulfamethoxazole-Trimethoprim Other (See Comments)    Carlyn Reichert Syndrome    Medications: Prior to Admission medications   Medication Sig Start Date End Date Taking? Authorizing Provider  acetaminophen (TYLENOL) 500 MG tablet Take 1,000 mg by mouth as needed for moderate pain.   Yes [provider]  amiodarone (PACERONE) 200 MG tablet Take 0.5 tablets (100 mg total) by mouth daily. 02/20/19  Yes Evans Lance, MD  amLODipine-olmesartan (AZOR) 5-40 MG tablet Take 1 tablet by mouth daily.   Yes [provider]  aspirin 81 MG tablet Take 81 mg by mouth in the morning and at bedtime.    Yes [provider]  atorvastatin (LIPITOR) 80 MG tablet Take 1 tablet (80 mg total) by  mouth daily at 6 PM. 02/20/19  Yes Evans Lance, MD  carvedilol (COREG) 6.25 MG tablet Take 1 tablet (6.25 mg total) by mouth 2 (two) times daily. 02/20/19  Yes Evans Lance, MD  ezetimibe (ZETIA) 10 MG tablet Take 1 tablet (10 mg total) by mouth daily. Patient taking differently: Take 40 mg by mouth daily.  02/20/19  Yes Evans Lance, MD  furosemide (LASIX) 40  MG tablet TAKE 1 TABLET BY MOUTH EVERY DAY . TAKE ADDITIONAL 40MG  AS NEEDED FOR SWELLING AND OR WEIGHT Patient taking differently: Take 40 mg by mouth daily.  02/20/19  Yes Evans Lance, MD  hydrALAZINE (APRESOLINE) 25 MG tablet Take 1 tablet (25 mg total) by mouth 2 (two) times daily. 02/20/19  Yes Evans Lance, MD  levothyroxine (SYNTHROID) 50 MCG tablet Take 1 tablet (50 mcg total) by mouth daily before breakfast. 02/20/19  Yes Evans Lance, MD  Multiple Vitamin (MULTIVITAMIN WITH MINERALS) TABS Take 1 tablet by mouth daily.   Yes [provider]  olmesartan (BENICAR) 40 MG tablet Take 40 mg by mouth daily.   Yes [provider]  omeprazole (PRILOSEC) 40 MG capsule Take 40 mg by mouth daily. 09/05/19  Yes [provider]  oxyCODONE (OXY IR/ROXICODONE) 5 MG immediate release tablet Take 5 mg by mouth as needed for pain. 09/02/19  Yes [provider]  amLODipine-olmesartan (AZOR) 10-40 MG tablet Take 1 tablet by mouth daily. Patient not taking: Reported on 09/12/2019 02/20/19 02/15/20  Evans Lance, MD  ibuprofen (ADVIL,MOTRIN) 800 MG tablet TAKE 1 TABLET BY MOUTH EVERY 8 HOURS AS NEEDED Patient not taking: Reported on 09/12/2019 03/31/18   Edrick Kins, DPM  mupirocin ointment (BACTROBAN) 2 % Apply 1 application topically 2 (two) times daily. Apply to the affected area 2 times a day Patient not taking: Reported on 09/12/2019 07/11/19   Newt Minion, MD    I have reviewed the patient's current and reported prior to admission medications.  Labs:  BMP Latest Ref Rng & Units 09/16/2019 09/15/2019  09/14/2019  Glucose 70 - 99 mg/dL 105(H) 99 111(H)  BUN 8 - 23 mg/dL 56(H) 55(H) 57(H)  Creatinine 0.61 - 1.24 mg/dL 2.80(H) 2.66(H) 2.92(H)  BUN/Creat Ratio 10 - 24 - - -  Sodium 135 - 145 mmol/L 147(H) 145 145  Potassium 3.5 - 5.1 mmol/L 3.5 3.5 3.5  Chloride 98 - 111 mmol/L 114(H) 111 113(H)  CO2 22 - 32 mmol/L 24 24 24   Calcium 8.9 - 10.3 mg/dL 7.7(L) 7.8(L) 7.7(L)    Urinalysis    Component Value Date/Time   COLORURINE YELLOW 09/12/2019 1653   APPEARANCEUR CLOUDY (A) 09/12/2019 1653   APPEARANCEUR Clear 06/14/2018 1425   LABSPEC 1.016 09/12/2019 1653   PHURINE 5.0 09/12/2019 1653   GLUCOSEU NEGATIVE 09/12/2019 1653   GLUCOSEU NEGATIVE 09/01/2010 0941   HGBUR NEGATIVE 09/12/2019 1653   BILIRUBINUR NEGATIVE 09/12/2019 1653   BILIRUBINUR Negative 06/14/2018 1425   KETONESUR NEGATIVE 09/12/2019 1653   PROTEINUR 30 (A) 09/12/2019 1653   UROBILINOGEN 1.0 08/02/2013 1357   NITRITE NEGATIVE 09/12/2019 1653   LEUKOCYTESUR MODERATE (A) 09/12/2019 1653     ROS:  Pertinent items noted in HPI and remainder of comprehensive ROS otherwise negative.    Physical Exam: Vitals:   09/16/19 0051 09/16/19 0608  BP: (!) 133/46 (!) 137/49  Pulse: 75 74  Resp: 16 16  Temp: 98.1 F (36.7 C) 97.6 F (36.4 C)  SpO2: 94% 94%     General: adult male in bed in NAD HEENT: NCAT Eyes: EOMI sclera anicteric Neck: supple trachea medline Heart: S1S2 no rub Lungs: clear and unlabored on room air Abdomen: softly dist obese habitus nt Extremities: left knee incision approximated; left leg 1-2+ edema and right trace edema. SCD's in place Neuro: alert and oriented x 3 provides hx and follows commands psych normal mood and affect  Assessment/Plan:  # AKI  -  Ischemic and pre-renal insults in the setting of severe anemia, non-steroidals, diuretic, and ARB.  Also had been on vanc - no level available.  CK elevated but not extreme.  Baseline Cr near 1. UA 0-5 RBC and 30 mg/dL protein.  Renal US  with normal echogenicity and no hydro  - Defer scheduled lasix for now and reassess daily  - Add on vanc level  - Check up/cr ratio  - changed to renal diet  - continue supportive measures  - discussed to avoid NSAIDs now and on discharge - holding ARB - Strict ins/outs are ordered  # Anemia - macrocytic and symptomatic  - Large complex hemorrhagic collection in leg per CT scan with concern for possible infected hematoma - Improving  - Note haptoglobin and LDH  - Would consult hem/onc  - ID is following  # Left knee joint infection s/p debridement with disc replacement  - ID is consulted  - abx per ID   # HTN  - Controlled on current regimen   - Please avoid his ARB due to AKI  - renal to guide diuretics if needed  # Hypernatremia  - Free water deficit  - Defer lasix today  - limited duration D5 (50 /hr x 8 hours)  Claudia Desanctis 09/16/2019, 10:44 AM

## 2019-09-16 NOTE — Progress Notes (Signed)
Left thigh measures 58 cm.

## 2019-09-17 LAB — RENAL FUNCTION PANEL
Albumin: 2.4 g/dL — ABNORMAL LOW (ref 3.5–5.0)
Anion gap: 8 (ref 5–15)
BUN: 49 mg/dL — ABNORMAL HIGH (ref 8–23)
CO2: 24 mmol/L (ref 22–32)
Calcium: 7.8 mg/dL — ABNORMAL LOW (ref 8.9–10.3)
Chloride: 112 mmol/L — ABNORMAL HIGH (ref 98–111)
Creatinine, Ser: 2.53 mg/dL — ABNORMAL HIGH (ref 0.61–1.24)
GFR calc Af Amer: 27 mL/min — ABNORMAL LOW (ref >60)
GFR calc non Af Amer: 23 mL/min — ABNORMAL LOW (ref >60)
Glucose, Bld: 113 mg/dL — ABNORMAL HIGH (ref 70–99)
Phosphorus: 3.5 mg/dL (ref 2.5–4.6)
Potassium: 3.5 mmol/L (ref 3.5–5.1)
Sodium: 144 mmol/L (ref 135–145)

## 2019-09-17 LAB — CBC
HCT: 29.3 % — ABNORMAL LOW (ref 39.0–52.0)
Hemoglobin: 8.9 g/dL — ABNORMAL LOW (ref 13.0–17.0)
MCH: 31 pg (ref 26.0–34.0)
MCHC: 30.4 g/dL (ref 30.0–36.0)
MCV: 102.1 fL — ABNORMAL HIGH (ref 80.0–100.0)
Platelets: 245 10*3/uL (ref 150–400)
RBC: 2.87 MIL/uL — ABNORMAL LOW (ref 4.22–5.81)
RDW: 19.2 % — ABNORMAL HIGH (ref 11.5–15.5)
WBC: 10.6 10*3/uL — ABNORMAL HIGH (ref 4.0–10.5)
nRBC: 0 % (ref 0.0–0.2)

## 2019-09-17 LAB — LACTATE DEHYDROGENASE: LDH: 502 U/L — ABNORMAL HIGH (ref 98–192)

## 2019-09-17 MED ORDER — IPRATROPIUM-ALBUTEROL 0.5-2.5 (3) MG/3ML IN SOLN
3.0000 mL | Freq: Four times a day (QID) | RESPIRATORY_TRACT | Status: DC | PRN
  Filled 2019-09-17: qty 3

## 2019-09-17 MED ORDER — IPRATROPIUM-ALBUTEROL 0.5-2.5 (3) MG/3ML IN SOLN
3.0000 mL | Freq: Four times a day (QID) | RESPIRATORY_TRACT | Status: DC
  Administered 2019-09-17: 3 mL via RESPIRATORY_TRACT
  Filled 2019-09-17: qty 3

## 2019-09-17 MED ORDER — IPRATROPIUM-ALBUTEROL 0.5-2.5 (3) MG/3ML IN SOLN
3.0000 mL | Freq: Three times a day (TID) | RESPIRATORY_TRACT | Status: DC
  Administered 2019-09-17: 3 mL via RESPIRATORY_TRACT
  Filled 2019-09-17: qty 3

## 2019-09-17 NOTE — Progress Notes (Signed)
Progress Note    Luis Herrera  WEX:[id number] DOB: May 08, 1940  DOA: 09/12/2019 PCP: [name], MD    Brief Narrative:     Medical records reviewed and are as summarized below:  Luis Herrera is an 79 y.o. male who was sent to the ER after outpatient labs done yesterday revealed a hemoglobin of 5.5. Today in the ER his hemoglobin is 6.3.  the initial hemoglobin they collected 1 week ago was 11.5.  His creatinine today in the ER is 2.63.  Yesterday's creatinine was 1.2.  Long initial creatinine was 0.97.  He was recently hospitalized in [place] with infection of the left knee.  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  He states he had a washout of the knee and the disc was replaced.  The cultures were negative, therefore, he was discharged with a PICC line on broad-spectrum antibiotics Zosyn and vancomycin.  He was discharged with aspirin 81 mg p.o. twice daily which is an increased dosage from once daily.  He was on meloxicam which was DC'd and he was started on oxycodone.  RECORDS FROM [facility]:  Labs: 7/16: 11.5  7/17: hgb: 8.9  7/19am 5.8/6.0 (got 2 units PRBC)-->  7.7     7/20 7.7  Cr: 7/17: 0.98   7/19: 1.35  7/20: 1.07 Final report from Microbiology: No growth (7/22)   Assessment/Plan:   Principal Problem:   Symptomatic anemia Active Problems:   Hyperlipidemia with target LDL less than 130   Essential hypertension   Aortic valve replaced   CAD (coronary artery disease)   Hypothyroidism   AKI (acute kidney injury) (HCC)   Anemia   Pressure injury of skin   Symptomatic Anemia -from Hematoma in thigh  -patient is s/p 5 units since surgery (2 with [facility] and 2 here)- 1 unit given 7/29 with IV lasix -heme negative U/S shows complex collection of unknown substance: Appearance of complex collection of unknown etiology in mid-thigh  measuring 5.3 x 3.8 x 5.3 cm.  -CT scan: Large complex hemorrhagic collection extending in the  suprapatellar joint space and within the quadriceps musculature to the level of the mid femur.  -has been seem by orthopedics here and will follow up in [place]  Dyspnea at rest -SOB improved with nebs and incentive spirometry  AKI- most likely due to abx/and other meds -renal u/s unrevealing -monitor closely -nephrology consult appreciated -trending down  Recent left knee debridement with disc replacement -was sent home with IV Abx and PICC Line -U/s with above results -left thigh: 70; right 54-- not enlarging -reviewed records from [facility] -recommended 6 weeks IV abx (cultures with no growth) -ID consult appreciated- medications changed  CAD (coronary artery disease)/frequent PVCs -Stable, home meds resumed except aspirin for now -Continue amiodarone, treatment for patient's frequent PVCs  aortic valve replacement -murmur most likely due to anemia -appreciate cardiology consult- doubt hemolysis or valve misfunction  Essential hypertension -stable, home meds resumed  Hyperlipidemia with target LDL less than 130 -Stable, home meds resumed   Hypothyroidism -Stable, home meds resumed  obesity Body mass index is 30.68 kg/m.   Pressure Injury 09/13/19 Sacrum Stage 1 -  Intact skin with non-blanchable redness of a localized area usually over a bony prominence. (Active)  09/13/19 1248  Location: Sacrum  Location Orientation:   Staging: Stage 1 -  Intact skin with non-blanchable redness of a localized area usually over a bony prominence.  Wound Description (Comments):   Present  on Admission: Yes       Family Communication/Anticipated D/C date and plan/Code Status   DVT prophylaxis: scd Code Status: Full Code.  Family Communication: wife at beside 8/1 Disposition Plan: Status is: Inpatient  Remains inpatient appropriate because:Hemodynamically unstable   Dispo: The patient is from: Home              Anticipated d/c is to: Home              Anticipated  d/c date is: 1 day              Patient currently is not medically stable to d/c.         Medical Consultants:    Ortho  ID  Cardiology      Subjective:   Using incentive spirometry-- feels like he can take a deep breath now  Objective:    Vitals:   09/16/19 2355 09/17/19 0516 09/17/19 1056 09/17/19 1438  BP: (!) 139/48 (!) 158/49 (!) 139/42   Pulse: 74 75 64   Resp: 18 19 18    Temp: 98.3 F (36.8 C) 97.9 F (36.6 C) 98 F (36.7 C)   TempSrc: Oral Oral Oral   SpO2: 95% 94% 98% 98%  Weight:      Height:        Intake/Output Summary (Last 24 hours) at 09/17/2019 1517 Last data filed at 09/17/2019 1400 Gross per 24 hour  Intake 480 ml  Output 675 ml  Net -195 ml   Filed Weights   09/12/19 1752  Weight: (!) 99.8 kg    Exam:    General: Appearance:    Obese male in no acute distress     Lungs:      respirations unlabored, diminished due to size  Heart:    Normal heart rate. Normal rhythm.    MS:   All extremities are intact. Left leg with improved movement of knee and less swelling in thigh  Neurologic:   Awake, alert, oriented x 3. No apparent focal neurological           defect.        Data Reviewed:   I have personally reviewed following labs and imaging studies:  Labs: Labs show the following:   Basic Metabolic Panel: Recent Labs  Lab 09/13/19 0808 09/13/19 0808 09/14/19 0423 09/14/19 0423 09/15/19 0752 09/15/19 0752 09/16/19 0415 09/17/19 0407  NA 145  --  145  --  145  --  147* 144  K 3.4*   < > 3.5   < > 3.5   < > 3.5 3.5  CL 113*  --  113*  --  111  --  114* 112*  CO2 21*  --  24  --  24  --  24 24  GLUCOSE 90  --  111*  --  99  --  105* 113*  BUN 56*  --  57*  --  55*  --  56* 49*  CREATININE 2.90*  --  2.92*  --  2.66*  --  2.80* 2.53*  CALCIUM 7.7*  --  7.7*  --  7.8*  --  7.7* 7.8*  PHOS  --   --   --   --   --   --   --  3.5   < > = values in this interval not displayed.   GFR Estimated Creatinine Clearance: 29  mL/min (A) (by C-G formula based on SCr of 2.53 mg/dL (H)). Liver Function Tests:  Recent Labs  Lab 09/12/19 1240 09/13/19 0808 09/17/19 0407  AST 60* 47*  --   ALT 30 26  --   ALKPHOS 52 50  --   BILITOT 1.5* 1.7*  --   PROT 5.6* 5.2*  --   ALBUMIN 2.5* 2.4* 2.4*   No results for input(s): LIPASE, AMYLASE in the last 168 hours. No results for input(s): AMMONIA in the last 168 hours. Coagulation profile Recent Labs  Lab 09/12/19 1707  INR 1.2    CBC: Recent Labs  Lab 09/12/19 1240 09/13/19 0808 09/14/19 0423 09/14/19 2300 09/15/19 0752 09/16/19 0415 09/17/19 0407  WBC 15.8*  --  14.9*  --  14.9* 12.8* 10.6*  HGB 6.3*   < > 7.7* 8.6* 8.7* 8.5* 8.9*  HCT 22.2*   < > 24.7* 27.8* 28.0* 27.8* 29.3*  MCV 109.4*  --  102.1*  --  99.3 102.2* 102.1*  PLT 314  --  265  --  264 242 245   < > = values in this interval not displayed.   Cardiac Enzymes: Recent Labs  Lab 09/14/19 0423  CKTOTAL 870*   BNP (last 3 results) Recent Labs    12/13/18 0832 05/22/19 1028  PROBNP 312 333   CBG: No results for input(s): GLUCAP in the last 168 hours. D-Dimer: No results for input(s): DDIMER in the last 72 hours. Hgb A1c: No results for input(s): HGBA1C in the last 72 hours. Lipid Profile: No results for input(s): CHOL, HDL, LDLCALC, TRIG, CHOLHDL, LDLDIRECT in the last 72 hours. Thyroid function studies: No results for input(s): TSH, T4TOTAL, T3FREE, THYROIDAB in the last 72 hours.  Invalid input(s): FREET3 Anemia work up: No results for input(s): VITAMINB12, FOLATE, FERRITIN, TIBC, IRON, RETICCTPCT in the last 72 hours. Sepsis Labs: Recent Labs  Lab 09/14/19 0423 09/15/19 0752 09/16/19 0415 09/17/19 0407  WBC 14.9* 14.9* 12.8* 10.6*    Microbiology Recent Results (from the past 240 hour(s))  SARS Coronavirus 2 by RT PCR (hospital order, performed in [facility] hospital lab) Nasopharyngeal Nasopharyngeal Swab     Status: None   Collection Time: 09/12/19  6:55 PM     Specimen: Nasopharyngeal Swab  Result Value Ref Range Status   SARS Coronavirus 2 NEGATIVE NEGATIVE Final    Comment: (NOTE) SARS-CoV-2 target nucleic acids are NOT DETECTED.  The SARS-CoV-2 RNA is generally detectable in upper and lower respiratory specimens during the acute phase of infection. The lowest concentration of SARS-CoV-2 viral copies this assay can detect is 250 copies / mL. A negative result does not preclude SARS-CoV-2 infection and should not be used as the sole basis for treatment or other patient management decisions.  A negative result may occur with improper specimen collection / handling, submission of specimen other than nasopharyngeal swab, presence of viral mutation(s) within the areas targeted by this assay, and inadequate number of viral copies (<250 copies / mL). A negative result must be combined with clinical observations, patient history, and epidemiological information.  Fact Sheet for Patients:   StrictlyIdeas.no  Fact Sheet for Healthcare Providers: BankingDealers.co.za  This test is not yet approved or  cleared by the [place] FDA and has been authorized for detection and/or diagnosis of SARS-CoV-2 by FDA under an Emergency Use Authorization (EUA).  This EUA will remain in effect (meaning this test can be used) for the duration of the COVID-19 declaration under Section 564(b)(1) of the Act, 21 U.S.C. section 360bbb-3(b)(1), unless the authorization is terminated or revoked sooner.  Performed  at [facility], [place] [address], [place], [place] [address]     Procedures and diagnostic studies:  No results found.  Medications:   . amiodarone  100 mg Oral Daily  . amLODipine  10 mg Oral Daily  . carvedilol  6.25 mg Oral BID  . Chlorhexidine Gluconate Cloth  6 each Topical Daily  . doxycycline  100 mg Oral Q12H  . ezetimibe  10 mg Oral Daily  . hydrALAZINE  25 mg Oral BID  .  levothyroxine  50 mcg Oral QAC breakfast  . multivitamin with minerals  1 tablet Oral Daily  . pantoprazole (PROTONIX) IV  40 mg Intravenous Q24H  . sodium chloride flush  10-40 mL Intracatheter Q12H   Continuous Infusions: . cefTRIAXone (ROCEPHIN)  IV 2 g (09/17/19 0905)     LOS: 5 days   Geradine Girt  [facility]   How to contact the [facility] Attending or Consulting provider [place] or covering provider during after hours [place], for this patient?  1. Check the care team in [facility] and look for a) attending/consulting [facility] provider listed and b) the [facility] team listed 2. Log into [email or web address] and use [place]'s universal password to access. If you do not have the password, please contact the hospital operator. 3. Locate the [facility] provider you are looking for under [facility] and page to a number that you can be directly reached. 4. If you still have difficulty reaching the provider, please page the [facility] (Director on Call) for the Hospitalists listed on amion for assistance.  09/17/2019, 3:17 PM

## 2019-09-17 NOTE — Progress Notes (Signed)
[facility] Progress Note    Assessment/ Plan:   # AKI  - Ischemic and pre-renal insults in the setting of severe anemia, non-steroidals, diuretic, and ARB. Vanc level 12 so likely noncontributory.  CK elevated but not extreme.  Baseline Cr near 1. UA 0-5 RBC and 30 mg/dL protein- not reflective of GN.  Renal US with normal echogenicity and no hydro  - UP/C ratio 0.6 _ cr improving 2.80--> 2.53 today - continue to hold ARB - would restart Lasix 40 mg daily (his home dose) if we see further downtrending tomorrow - does not need more fluids - discussed with pt to avoid all NSAIDs and only use Tylenol for OTC pain relief  # Anemia - macrocytic and symptomatic  - Large complex hemorrhagic collection in leg per CT scan with concern for possible infected hematoma - ID following, on CTX/ doxy, end date 8/31.  # Left knee joint infection s/p debridement with disc replacement  - ID is consulted  - abx per ID   # HTN  - Controlled on current regimen   - Please avoid his ARB due to AKI  - renal to guide diuretics if needed  # Hypernatremia  - resolved   Subjective:    NAEON.  Cr is coming down.  Eager to go home when possible- has 16 year old grandson he and his wife keep for the summer.     Objective:   BP (!) 158/49 (BP Location: Right Arm)   Pulse 75   Temp 97.9 F (36.6 C) (Oral)   Resp 19   Ht 5\' 11"  (1.803 m)   Wt (!) 99.8 kg   SpO2 94%   BMI 30.68 kg/m   Intake/Output Summary (Last 24 hours) at 09/17/2019 9622 Last data filed at 09/17/2019 0602 Gross per 24 hour  Intake 480 ml  Output 925 ml  Net -445 ml   Weight change:   Physical Exam: Gen: NAD, lying flat in bed on no O2 CVS: RRR Resp: some upper airway sounds, no frank wheezing Abd: soft, nontender Ext: L leg TKA scar with L sided swelling and warmth, better per pt. R side no LE edema  Imaging: No results found.  Labs: BMET Recent Labs  Lab 09/12/19 1240 09/13/19 0808  09/14/19 0423 09/15/19 0752 09/16/19 0415 09/17/19 0407  NA 144 145 145 145 147* 144  K 3.7 3.4* 3.5 3.5 3.5 3.5  CL 113* 113* 113* 111 114* 112*  CO2 23 21* 24 24 24 24   GLUCOSE 118* 90 111* 99 105* 113*  BUN 53* 56* 57* 55* 56* 49*  CREATININE 2.63* 2.90* 2.92* 2.66* 2.80* 2.53*  CALCIUM 7.9* 7.7* 7.7* 7.8* 7.7* 7.8*  PHOS  --   --   --   --   --  3.5   CBC Recent Labs  Lab 09/14/19 0423 09/14/19 0423 09/14/19 2300 09/15/19 0752 09/16/19 0415 09/17/19 0407  WBC 14.9*  --   --  14.9* 12.8* 10.6*  HGB 7.7*   < > 8.6* 8.7* 8.5* 8.9*  HCT 24.7*   < > 27.8* 28.0* 27.8* 29.3*  MCV 102.1*  --   --  99.3 102.2* 102.1*  PLT 265  --   --  264 242 245   < > = values in this interval not displayed.    Medications:    . amiodarone  100 mg Oral Daily  . amLODipine  10 mg Oral Daily  . carvedilol  6.25 mg Oral BID  .  Chlorhexidine Gluconate Cloth  6 each Topical Daily  . doxycycline  100 mg Oral Q12H  . ezetimibe  10 mg Oral Daily  . hydrALAZINE  25 mg Oral BID  . levothyroxine  50 mcg Oral QAC breakfast  . multivitamin with minerals  1 tablet Oral Daily  . pantoprazole (PROTONIX) IV  40 mg Intravenous Q24H  . sodium chloride flush  10-40 mL Intracatheter Q12H      Madelon Lips, MD 09/17/2019, 9:24 AM

## 2019-09-18 LAB — RENAL FUNCTION PANEL
Albumin: 2.3 g/dL — ABNORMAL LOW (ref 3.5–5.0)
Anion gap: 6 (ref 5–15)
BUN: 46 mg/dL — ABNORMAL HIGH (ref 8–23)
CO2: 25 mmol/L (ref 22–32)
Calcium: 8 mg/dL — ABNORMAL LOW (ref 8.9–10.3)
Chloride: 113 mmol/L — ABNORMAL HIGH (ref 98–111)
Creatinine, Ser: 2.35 mg/dL — ABNORMAL HIGH (ref 0.61–1.24)
GFR calc Af Amer: 30 mL/min — ABNORMAL LOW (ref >60)
GFR calc non Af Amer: 26 mL/min — ABNORMAL LOW (ref >60)
Glucose, Bld: 100 mg/dL — ABNORMAL HIGH (ref 70–99)
Phosphorus: 3.4 mg/dL (ref 2.5–4.6)
Potassium: 3.6 mmol/L (ref 3.5–5.1)
Sodium: 144 mmol/L (ref 135–145)

## 2019-09-18 LAB — CBC
HCT: 29.8 % — ABNORMAL LOW (ref 39.0–52.0)
Hemoglobin: 9 g/dL — ABNORMAL LOW (ref 13.0–17.0)
MCH: 31 pg (ref 26.0–34.0)
MCHC: 30.2 g/dL (ref 30.0–36.0)
MCV: 102.8 fL — ABNORMAL HIGH (ref 80.0–100.0)
Platelets: 227 10*3/uL (ref 150–400)
RBC: 2.9 MIL/uL — ABNORMAL LOW (ref 4.22–5.81)
RDW: 18.8 % — ABNORMAL HIGH (ref 11.5–15.5)
WBC: 10 10*3/uL (ref 4.0–10.5)
nRBC: 0 % (ref 0.0–0.2)

## 2019-09-18 MED ORDER — IPRATROPIUM-ALBUTEROL 0.5-2.5 (3) MG/3ML IN SOLN
3.0000 mL | RESPIRATORY_TRACT | Status: DC | PRN
  Administered 2019-09-18: 3 mL via RESPIRATORY_TRACT
  Filled 2019-09-18: qty 3

## 2019-09-18 MED ORDER — IPRATROPIUM-ALBUTEROL 0.5-2.5 (3) MG/3ML IN SOLN
3.0000 mL | Freq: Three times a day (TID) | RESPIRATORY_TRACT | Status: DC
  Administered 2019-09-19: 3 mL via RESPIRATORY_TRACT
  Filled 2019-09-18: qty 3

## 2019-09-18 MED ORDER — FUROSEMIDE 40 MG PO TABS: 40.0000 mg | ORAL_TABLET | Freq: Every day | ORAL | Status: DC

## 2019-09-18 MED ORDER — DARBEPOETIN ALFA 100 MCG/0.5ML IJ SOSY
100.0000 ug | PREFILLED_SYRINGE | Freq: Once | INTRAMUSCULAR | Status: AC
  Administered 2019-09-18: 100 ug via SUBCUTANEOUS
  Filled 2019-09-18: qty 0.5

## 2019-09-18 MED ORDER — FUROSEMIDE 40 MG PO TABS
40.0000 mg | ORAL_TABLET | Freq: Every day | ORAL | Status: DC
  Administered 2019-09-19: 40 mg via ORAL
  Filled 2019-09-18: qty 1

## 2019-09-18 MED ORDER — FUROSEMIDE 10 MG/ML IJ SOLN
40.0000 mg | Freq: Once | INTRAMUSCULAR | Status: AC
  Administered 2019-09-18: 40 mg via INTRAVENOUS
  Filled 2019-09-18: qty 4

## 2019-09-18 NOTE — Progress Notes (Signed)
[facility] Progress Note    Assessment/ Plan:   # AKI  - Ischemic and pre-renal insults in the setting of severe anemia, non-steroidals, diuretic, and ARB. Vanc level 12 so likely noncontributory.  CK elevated but not extreme.  Baseline Cr near 1. UA 0-5 RBC and 30 mg/dL protein- not reflective of GN.  Renal US with normal echogenicity and no hydro  - UP/C ratio 0.6 _ cr improving 2.80--> 2.35 today - continue to hold ARB - would restart Lasix 40 mg daily  If crt holds or improves after lasix resumed, I would think can watch as an OP   - discussed with pt to avoid all NSAIDs and only use Tylenol for OTC pain relief  # Anemia - macrocytic and symptomatic  - Large complex hemorrhagic collection in leg per CT scan with concern for possible infected hematoma - ID following, on CTX/ doxy, end date 8/31. 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  Cr is coming down.  UOP seems adequate. Eager to go home when possible-   Objective:   BP (!) 142/52 (BP Location: Right Arm)   Pulse 75   Temp 97.7 F (36.5 C) (Oral)   Resp 16   Ht 5\' 11"  (1.803 m)   Wt (!) 99.8 kg   SpO2 93%   BMI 30.68 kg/m   Intake/Output Summary (Last 24 hours) at 09/18/2019 1047 Last data filed at 09/18/2019 2440 Gross per 24 hour  Intake 1390 ml  Output 800 ml  Net 590 ml   Weight change:   Physical Exam: Gen: NAD, lying flat in bed on no O2 CVS: RRR Resp: some upper airway sounds, no frank wheezing Abd: soft, nontender Ext: L leg TKA scar with L sided swelling and warmth, better per pt. R side but still appears edematous   Imaging: No results  found.  Labs: BMET Recent Labs  Lab 09/12/19 1240 09/13/19 1027 09/14/19 0423 09/15/19 0752 09/16/19 0415 09/17/19 0407 09/18/19 0434  NA 144 145 145 145 147* 144 144  K 3.7 3.4* 3.5 3.5 3.5 3.5 3.6  CL 113* 113* 113* 111 114* 112* 113*  CO2 23 21* 24 24 24 24 25   GLUCOSE 118* 90 111* 99 105* 113* 100*  BUN 53* 56* 57* 55* 56* 49* 46*  CREATININE 2.63* 2.90* 2.92* 2.66* 2.80* 2.53* 2.35*  CALCIUM 7.9* 7.7* 7.7* 7.8* 7.7* 7.8* 8.0*  PHOS  --   --   --   --   --  3.5 3.4   CBC Recent Labs  Lab 09/15/19 0752 09/16/19 0415 09/17/19 0407 09/18/19 0434  WBC 14.9* 12.8* 10.6* 10.0  HGB 8.7* 8.5* 8.9* 9.0*  HCT 28.0* 27.8* 29.3* 29.8*  MCV 99.3 102.2* 102.1* 102.8*  PLT 264 242 245 227    Medications:    . amiodarone  100 mg Oral Daily  . amLODipine  10 mg Oral Daily  . carvedilol  6.25 mg Oral BID  . Chlorhexidine Gluconate Cloth  6 each Topical Daily  . doxycycline  100 mg Oral Q12H  .  ezetimibe  10 mg Oral Daily  . hydrALAZINE  25 mg Oral BID  . ipratropium-albuterol  3 mL Nebulization TID  . levothyroxine  50 mcg Oral QAC breakfast  . multivitamin with minerals  1 tablet Oral Daily  . pantoprazole (PROTONIX) IV  40 mg Intravenous Q24H  . sodium chloride flush  10-40 mL Intracatheter Q12H      Luis Herrera  09/18/2019, 10:47 AM

## 2019-09-18 NOTE — Progress Notes (Signed)
Physical Therapy Treatment Patient Details Name: Luis Herrera MRN: [id number] DOB: 02-02-1941 Today's Date: 09/18/2019    History of Present Illness Pt is a 79 y/o male admitted secondary to symptomatic anemia and found to have low hemoglobin. Ortho was consulted and found that pt has a large L thigh hematoma, no restrictions and recommending WBAT. PMH including but not limited to CAD, HTN, L TKA in 2000, R TKA in 2013.    PT Comments    Pt in bed upon arrival of PT, eager to participate in session with focus on progression of ambulation and exercises for knee ROM. The pt was able to demo significant improvements in his ambulation endurance today, but continues to require use of a RW and took x3 standing rest breaks (VSS). The pt was also challenged by progression of active knee ROM exercises both in supine and seated positions. He was able to demo improvement within the session with reports of minimal pain. The pt will continue to benefit from skilled PT to further progress functional ROM and strength in his RLE as well as general activity tolerance and endurance.    Follow Up Recommendations  Outpatient PT     Equipment Recommendations  None recommended by PT    Recommendations for Other Services       Precautions / Restrictions Precautions Precautions: Fall Restrictions Weight Bearing Restrictions: Yes LLE Weight Bearing: Weight bearing as tolerated    Mobility  Bed Mobility Overal bed mobility: Needs Assistance Bed Mobility: Supine to Sit     Supine to sit: Supervision     General bed mobility comments: increased time and use of bed rails, no assist to BLE to move to sitting EOB  Transfers Overall transfer level: Needs assistance Equipment used: Rolling walker (2 wheeled) Transfers: Sit to/from Stand Sit to Stand: Min guard         General transfer comment: pt able to rise from bed with minG and use of RW. no physical assist to power  up  Ambulation/Gait Ambulation/Gait assistance: Min guard Gait Distance (Feet): 250 Feet Assistive device: Rolling walker (2 wheeled) Gait Pattern/deviations: Decreased step length - right;Decreased stance time - left;Decreased stride length;Decreased weight shift to left;Antalgic;Step-to pattern Gait velocity: 0.5 m/s Gait velocity interpretation: <1.31 ft/sec, indicative of household ambulator General Gait Details: pt with slow, steady gait with use of RW. Able to tolerate small amounts of L knee flexion during swing phase. no LOB or physical assist. x3 standing rest breaks       Balance Overall balance assessment: Needs assistance Sitting-balance support: Feet supported Sitting balance-Leahy Scale: Good     Standing balance support: During functional activity;Single extremity supported;Bilateral upper extremity supported Standing balance-Leahy Scale: Poor Standing balance comment: static stance with single UE, dynamic with BUE support                            Cognition Arousal/Alertness: Awake/alert Behavior During Therapy: WFL for tasks assessed/performed Overall Cognitive Status: Within Functional Limits for tasks assessed                                        Exercises General Exercises - Lower Extremity Quad Sets: AROM;Left;10 reps;Supine Heel Slides: AROM;Left;10 reps;Seated    General Comments        Pertinent Vitals/Pain Pain Assessment: Faces Faces Pain Scale: Hurts a little bit Pain Location:  L knee with active knee flexion Pain Descriptors / Indicators: Guarding;Sore Pain Intervention(s): Monitored during session     PT Goals (current goals can now be found in the care plan section) Acute Rehab PT Goals Patient Stated Goal: to decrease pain PT Goal Formulation: With patient Time For Goal Achievement: 09/28/19 Potential to Achieve Goals: Good Progress towards PT goals: Progressing toward goals    Frequency    Min  3X/week      PT Plan Current plan remains appropriate    Co-evaluation              AM-PAC PT "6 Clicks" Mobility   Outcome Measure  Help needed turning from your back to your side while in a flat bed without using bedrails?: None Help needed moving from lying on your back to sitting on the side of a flat bed without using bedrails?: None Help needed moving to and from a bed to a chair (including a wheelchair)?: None Help needed standing up from a chair using your arms (e.g., wheelchair or bedside chair)?: None Help needed to walk in hospital room?: A Little Help needed climbing 3-5 steps with a railing? : A Little 6 Click Score: 22    End of Session Equipment Utilized During Treatment: Gait belt Activity Tolerance: Patient limited by fatigue;Patient tolerated treatment well Patient left: with call bell/phone within reach;in chair Nurse Communication: Mobility status PT Visit Diagnosis: Other abnormalities of gait and mobility (R26.89)     Time: 4158-3094 PT Time Calculation (min) (ACUTE ONLY): 26 min  Charges:  $Gait Training: 8-22 mins $Therapeutic Exercise: 8-22 mins                     Karma Ganja, PT, DPT   Acute Rehabilitation Department Pager #: [phone]   Otho Bellows 09/18/2019, 3:39 PM

## 2019-09-18 NOTE — Progress Notes (Signed)
Progress Note    Luis Herrera  WEX:[id number] DOB: 05/02/1940  DOA: 09/12/2019 PCP: [name], MD    Brief Narrative:     Medical records reviewed and are as summarized below:  Luis Herrera is an 79 y.o. male who was sent to the ER after outpatient labs done yesterday revealed a hemoglobin of 5.5. Today in the ER his hemoglobin is 6.3.  the initial hemoglobin they collected 1 week ago was 11.5.  His creatinine today in the ER is 2.63.  Yesterday's creatinine was 1.2.  [place] initial creatinine was 0.97.  He was recently hospitalized in [place] with infection of the left knee.  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  He states he had a washout of the knee and the disc was replaced.  The cultures were negative, therefore, he was discharged with a PICC line on broad-spectrum antibiotics Zosyn and vancomycin.  He was discharged with aspirin 81 mg p.o. twice daily which is an increased dosage from once daily.  He was on meloxicam which was DC'd and he was started on oxycodone.  RECORDS FROM [facility]:  Labs: 7/16: 11.5  7/17: hgb: 8.9  7/19am 5.8/6.0 (got 2 units PRBC)-->  7.7     7/20 7.7  Cr: 7/17: 0.98   7/19: 1.35  7/20: 1.07 Final report from Microbiology: No growth (7/22)   Assessment/Plan:   Principal Problem:   Symptomatic anemia Active Problems:   Hyperlipidemia with target LDL less than 130   Essential hypertension   Aortic valve replaced   CAD (coronary artery disease)   Hypothyroidism   AKI (acute kidney injury) (HCC)   Anemia   Pressure injury of skin   Symptomatic Anemia- ABLA -from Hematoma in thigh  -patient is s/p 5 units since surgery (2 with [facility] and 2 here)- 1 unit given 7/29 with IV lasix -heme negative U/S shows complex collection of unknown substance: Appearance of complex collection of unknown etiology in mid-thigh  measuring 5.3 x 3.8 x 5.3 cm.  -CT scan: Large complex hemorrhagic collection extending in the  suprapatellar joint space and within the quadriceps musculature to the level of the mid femur.  -has been seem by orthopedics here and will follow up in [place]  Dyspnea at rest-- seems to worsen at night -had some squeaky/high pitched wheezing in LLL -showed patient how to use incentive spirometry correctly  AKI- most likely due to abx/and other meds -renal u/s unrevealing -monitor closely -nephrology consult appreciated -trending down -resume lasix today -BMP in AM  Recent left knee debridement with disc replacement -was sent home with IV Abx and PICC Line -U/s with above results -left thigh: 70; right 54-- not enlarging -reviewed [facility] -recommended 6 weeks IV abx (cultures with no growth) -ID consult appreciated- medications changed  CAD (coronary artery disease)/frequent PVCs -Stable, home meds resumed except aspirin for now -Continue amiodarone, treatment for patient's frequent PVCs  aortic valve replacement -murmur most likely due to anemia -appreciate cardiology consult- doubt hemolysis or valve misfunction  Essential hypertension -stable, home meds resumed  Hyperlipidemia with target LDL less than 130 -Stable, home meds resumed   Hypothyroidism -Stable, home meds resumed  obesity Body mass index is 30.68 kg/m.   Pressure Injury 09/13/19 Sacrum Stage 1 -  Intact skin with non-blanchable redness of a localized area usually over a bony prominence. (Active)  09/13/19 1248  Location: Sacrum  Location Orientation:   Staging: Stage 1 -  Intact  skin with non-blanchable redness of a localized area usually over a bony prominence.  Wound Description (Comments):   Present on Admission: Yes       Family Communication/Anticipated D/C date and plan/Code Status   DVT prophylaxis: scd Code Status: Full Code.  Family Communication: wife at beside 8/2 Disposition Plan: Status is: Inpatient  Remains inpatient appropriate because:Hemodynamically  unstable   Dispo: The patient is from: Home              Anticipated d/c is to: Home              Anticipated d/c date is: 1 day              Patient currently is not medically stable to d/c.         Medical Consultants:    Ortho  ID  Cardiology      Subjective:   Thinks the breathing treatment made him worse  Objective:    Vitals:   09/18/19 0046 09/18/19 0519 09/18/19 0756 09/18/19 1224  BP: (!) 135/43 118/67 (!) 142/52 (!) 138/37  Pulse: 70 71 75 63  Resp: 18 16  16   Temp: 98.1 F (36.7 C) 97.7 F (36.5 C)  97.7 F (36.5 C)  TempSrc: Oral Oral  Oral  SpO2: 94% 93%  95%  Weight:      Height:        Intake/Output Summary (Last 24 hours) at 09/18/2019 1551 Last data filed at 09/18/2019 1400 Gross per 24 hour  Intake 825 ml  Output 850 ml  Net -25 ml   Filed Weights   09/12/19 1752  Weight: (!) 99.8 kg    Exam:   General: Appearance:    Obese male in no acute distress     Lungs:     LLL with squeaky wheeze,  Heart:    Normal heart rate. Normal rhythm.    MS:   All extremities are intact. +LE edema but left leg continues to improve  Neurologic:   Awake, alert, oriented x 3. No apparent focal neurological           defect.        Data Reviewed:   I have personally reviewed following labs and imaging studies:  Labs: Labs show the following:   Basic Metabolic Panel: Recent Labs  Lab 09/14/19 0423 09/14/19 0423 09/15/19 3785 09/15/19 0752 09/16/19 0415 09/16/19 0415 09/17/19 0407 09/18/19 0434  NA 145  --  145  --  147*  --  144 144  K 3.5   < > 3.5   < > 3.5   < > 3.5 3.6  CL 113*  --  111  --  114*  --  112* 113*  CO2 24  --  24  --  24  --  24 25  GLUCOSE 111*  --  99  --  105*  --  113* 100*  BUN 57*  --  55*  --  56*  --  49* 46*  CREATININE 2.92*  --  2.66*  --  2.80*  --  2.53* 2.35*  CALCIUM 7.7*  --  7.8*  --  7.7*  --  7.8* 8.0*  PHOS  --   --   --   --   --   --  3.5 3.4   < > = values in this interval not  displayed.   GFR Estimated Creatinine Clearance: 31.2 mL/min (A) (by C-G formula based on SCr of 2.35  mg/dL (H)). Liver Function Tests: Recent Labs  Lab 09/12/19 1240 09/13/19 0808 09/17/19 0407 09/18/19 0434  AST 60* 47*  --   --   ALT 30 26  --   --   ALKPHOS 52 50  --   --   BILITOT 1.5* 1.7*  --   --   PROT 5.6* 5.2*  --   --   ALBUMIN 2.5* 2.4* 2.4* 2.3*   No results for input(s): LIPASE, AMYLASE in the last 168 hours. No results for input(s): AMMONIA in the last 168 hours. Coagulation profile Recent Labs  Lab 09/12/19 1707  INR 1.2    CBC: Recent Labs  Lab 09/14/19 0423 09/14/19 0423 09/14/19 2300 09/15/19 0752 09/16/19 0415 09/17/19 0407 09/18/19 0434  WBC 14.9*  --   --  14.9* 12.8* 10.6* 10.0  HGB 7.7*   < > 8.6* 8.7* 8.5* 8.9* 9.0*  HCT 24.7*   < > 27.8* 28.0* 27.8* 29.3* 29.8*  MCV 102.1*  --   --  99.3 102.2* 102.1* 102.8*  PLT 265  --   --  264 242 245 227   < > = values in this interval not displayed.   Cardiac Enzymes: Recent Labs  Lab 09/14/19 0423  CKTOTAL 870*   BNP (last 3 results) Recent Labs    12/13/18 0832 05/22/19 1028  PROBNP 312 333   CBG: No results for input(s): GLUCAP in the last 168 hours. D-Dimer: No results for input(s): DDIMER in the last 72 hours. Hgb A1c: No results for input(s): HGBA1C in the last 72 hours. Lipid Profile: No results for input(s): CHOL, HDL, LDLCALC, TRIG, CHOLHDL, LDLDIRECT in the last 72 hours. Thyroid function studies: No results for input(s): TSH, T4TOTAL, T3FREE, THYROIDAB in the last 72 hours.  Invalid input(s): FREET3 Anemia work up: No results for input(s): VITAMINB12, FOLATE, FERRITIN, TIBC, IRON, RETICCTPCT in the last 72 hours. Sepsis Labs: Recent Labs  Lab 09/15/19 0752 09/16/19 0415 09/17/19 0407 09/18/19 0434  WBC 14.9* 12.8* 10.6* 10.0    Microbiology Recent Results (from the past 240 hour(s))  SARS Coronavirus 2 by RT PCR (hospital order, performed in [facility]  hospital lab) Nasopharyngeal Nasopharyngeal Swab     Status: None   Collection Time: 09/12/19  6:55 PM   Specimen: Nasopharyngeal Swab  Result Value Ref Range Status   SARS Coronavirus 2 NEGATIVE NEGATIVE Final    Comment: (NOTE) SARS-CoV-2 target nucleic acids are NOT DETECTED.  The SARS-CoV-2 RNA is generally detectable in upper and lower respiratory specimens during the acute phase of infection. The lowest concentration of SARS-CoV-2 viral copies this assay can detect is 250 copies / mL. A negative result does not preclude SARS-CoV-2 infection and should not be used as the sole basis for treatment or other patient management decisions.  A negative result may occur with improper specimen collection / handling, submission of specimen other than nasopharyngeal swab, presence of viral mutation(s) within the areas targeted by this assay, and inadequate number of viral copies (<250 copies / mL). A negative result must be combined with clinical observations, patient history, and epidemiological information.  Fact Sheet for Patients:   StrictlyIdeas.no  Fact Sheet for Healthcare Providers: BankingDealers.co.za  This test is not yet approved or  cleared by the [place] FDA and has been authorized for detection and/or diagnosis of SARS-CoV-2 by FDA under an Emergency Use Authorization (EUA).  This EUA will remain in effect (meaning this test can be used) for the duration of the  COVID-19 declaration under Section 564(b)(1) of the Act, 21 U.S.C. section 360bbb-3(b)(1), unless the authorization is terminated or revoked sooner.  Performed at [facility], [place] [address], [place], [place] [address]     Procedures and diagnostic studies:  No results found.  Medications:   . amiodarone  100 mg Oral Daily  . amLODipine  10 mg Oral Daily  . carvedilol  6.25 mg Oral BID  . Chlorhexidine Gluconate Cloth  6 each Topical Daily  .  darbepoetin (ARANESP) injection - NON-DIALYSIS  100 mcg Subcutaneous Once  . doxycycline  100 mg Oral Q12H  . ezetimibe  10 mg Oral Daily  . [START ON 09/19/2019] furosemide  40 mg Oral Daily  . hydrALAZINE  25 mg Oral BID  . levothyroxine  50 mcg Oral QAC breakfast  . multivitamin with minerals  1 tablet Oral Daily  . pantoprazole (PROTONIX) IV  40 mg Intravenous Q24H  . sodium chloride flush  10-40 mL Intracatheter Q12H   Continuous Infusions: . cefTRIAXone (ROCEPHIN)  IV 2 g (09/18/19 0808)     LOS: 6 days   Geradine Girt  [facility]   How to contact the [facility] Attending or Consulting provider [place] or covering provider during after hours [place], for this patient?  1. Check the care team in [facility] and look for a) attending/consulting [facility] provider listed and b) the [facility] team listed 2. Log into [email or web address] and use [place]'s universal password to access. If you do not have the password, please contact the hospital operator. 3. Locate the [facility] provider you are looking for under [facility] and page to a number that you can be directly reached. 4. If you still have difficulty reaching the provider, please page the [facility] (Director on Call) for the Hospitalists listed on amion for assistance.  09/18/2019, 3:51 PM

## 2019-09-19 ENCOUNTER — Inpatient Hospital Stay (HOSPITAL_COMMUNITY): Payer: Medicare Other

## 2019-09-19 LAB — RENAL FUNCTION PANEL
Albumin: 2.2 g/dL — ABNORMAL LOW (ref 3.5–5.0)
Anion gap: 10 (ref 5–15)
BUN: 44 mg/dL — ABNORMAL HIGH (ref 8–23)
CO2: 25 mmol/L (ref 22–32)
Calcium: 7.8 mg/dL — ABNORMAL LOW (ref 8.9–10.3)
Chloride: 107 mmol/L (ref 98–111)
Creatinine, Ser: 2.31 mg/dL — ABNORMAL HIGH (ref 0.61–1.24)
GFR calc Af Amer: 30 mL/min — ABNORMAL LOW (ref >60)
GFR calc non Af Amer: 26 mL/min — ABNORMAL LOW (ref >60)
Glucose, Bld: 111 mg/dL — ABNORMAL HIGH (ref 70–99)
Phosphorus: 3.5 mg/dL (ref 2.5–4.6)
Potassium: 3.9 mmol/L (ref 3.5–5.1)
Sodium: 142 mmol/L (ref 135–145)

## 2019-09-19 MED ORDER — DOXYCYCLINE MONOHYDRATE 100 MG PO TABS: 100.0000 mg | ORAL_TABLET | Freq: Two times a day (BID) | ORAL | 0 refills | Status: DC

## 2019-09-19 MED ORDER — HEPARIN SOD (PORK) LOCK FLUSH 100 UNIT/ML IV SOLN
250.0000 [IU] | INTRAVENOUS | Status: DC | PRN
  Filled 2019-09-19: qty 2.5

## 2019-09-19 MED ORDER — CEFTRIAXONE IV (FOR PTA / DISCHARGE USE ONLY)
2.0000 g | INTRAVENOUS | 0 refills | Status: DC
Start: 2019-09-19 — End: 2019-10-17

## 2019-09-19 MED ORDER — IPRATROPIUM-ALBUTEROL 0.5-2.5 (3) MG/3ML IN SOLN: 3.0000 mL | Freq: Two times a day (BID) | RESPIRATORY_TRACT | Status: DC

## 2019-09-19 MED ORDER — IPRATROPIUM-ALBUTEROL 0.5-2.5 (3) MG/3ML IN SOLN: 3.0000 mL | RESPIRATORY_TRACT | 0 refills | Status: DC | PRN

## 2019-09-19 MED ORDER — AMLODIPINE BESYLATE 10 MG PO TABS: 10.0000 mg | ORAL_TABLET | Freq: Every day | ORAL | 0 refills | Status: DC

## 2019-09-19 MED FILL — AMLODIPINE BESYLATE 10 MG T: 10 | 30 days supply | Qty: 30 | Fill #0

## 2019-09-19 MED FILL — IPRAT-ALBUT 0.5-3(2.5) MG/3: 0.5-2.5 (3) | 3 days supply | Qty: 60 | Fill #0

## 2019-09-19 MED FILL — DOXYCYCLINE HYCLATE 100 MG: 100 | 32 days supply | Qty: 64 | Fill #0

## 2019-09-19 NOTE — Progress Notes (Signed)
Luis Herrera Progress Note    Assessment/ Plan:   # AKI  - Ischemic and pre-renal insults in the setting of severe anemia, non-steroidals, diuretic, and ARB. Vanc level 12 so likely noncontributory.  CK elevated but not extreme.  Baseline Cr near 1. UA 0-5 RBC and 30 mg/dL protein- not reflective of GN.  Renal US with normal echogenicity and no hydro  - UP/C ratio 0.6 _ cr improving overall-  Is stable from yesterday to today even with lasix resumed - continue to hold ARB - would continue Lasix 40 mg daily   I think can watch as an OP -  Once discharged would do weekly chemistries to assess for improvement.  I will arrange a follow up at [facility]  - discussed with pt to avoid all NSAIDs and only use Tylenol for OTC pain relief  # Anemia - macrocytic and symptomatic  - Large complex hemorrhagic collection in leg per CT scan with concern for possible infected hematoma - ID following, on CTX/ doxy, end date 8/31. 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  Cr is stable  UOP seems adequate. Eager to go home-   Objective:   BP (!) 141/51 (BP Location: Right Arm)   Pulse 68   Temp 97.8 F (36.6 C) (Oral)   Resp 18   Ht 5\' 11"  (1.803 m)   Wt (!) 99.8 kg   SpO2 97%   BMI 30.68 kg/m   Intake/Output Summary (Last 24 hours) at 09/19/2019 1100 Last data filed at 09/19/2019 0825 Gross per 24 hour  Intake 610 ml  Output 1350 ml  Net -740 ml   Weight change:   Physical Exam: Gen: NAD, lying flat in bed on no O2 CVS: RRR Resp: some upper airway sounds, no frank wheezing Abd: soft, nontender Ext: L leg TKA scar with L sided swelling and warmth, better per pt. R side but still  appears edematous   Imaging: No results found.  Labs: BMET Recent Labs  Lab 09/13/19 4270 09/14/19 0423 09/15/19 0752 09/16/19 0415 09/17/19 0407 09/18/19 0434 09/19/19 0332  NA 145 145 145 147* 144 144 142  K 3.4* 3.5 3.5 3.5 3.5 3.6 3.9  CL 113* 113* 111 114* 112* 113* 107  CO2 21* 24 24 24 24 25 25   GLUCOSE 90 111* 99 105* 113* 100* 111*  BUN 56* 57* 55* 56* 49* 46* 44*  CREATININE 2.90* 2.92* 2.66* 2.80* 2.53* 2.35* 2.31*  CALCIUM 7.7* 7.7* 7.8* 7.7* 7.8* 8.0* 7.8*  PHOS  --   --   --   --  3.5 3.4 3.5   CBC Recent Labs  Lab 09/15/19 0752 09/16/19 0415 09/17/19 0407 09/18/19 0434  WBC 14.9* 12.8* 10.6* 10.0  HGB 8.7* 8.5* 8.9* 9.0*  HCT 28.0* 27.8* 29.3* 29.8*  MCV 99.3 102.2* 102.1* 102.8*  PLT 264 242 245 227    Medications:    . amiodarone  100 mg Oral Daily  . amLODipine  10 mg Oral Daily  . carvedilol  6.25 mg Oral BID  . Chlorhexidine Gluconate Cloth  6 each Topical  Daily  . doxycycline  100 mg Oral Q12H  . ezetimibe  10 mg Oral Daily  . furosemide  40 mg Oral Daily  . hydrALAZINE  25 mg Oral BID  . ipratropium-albuterol  3 mL Nebulization BID  . levothyroxine  50 mcg Oral QAC breakfast  . multivitamin with minerals  1 tablet Oral Daily  . pantoprazole (PROTONIX) IV  40 mg Intravenous Q24H  . sodium chloride flush  10-40 mL Intracatheter Q12H      Luis Herrera A Luis Herrera  09/19/2019, 11:00 AM

## 2019-09-19 NOTE — Care Management (Addendum)
Luis Herrera with [facility] aware patient is discharging today. [facility] also provides nursing.  Ordered NEB machine with Zach with [facility].  Magdalen Spatz RN

## 2019-09-19 NOTE — Discharge Summary (Signed)
Physician Discharge Summary  Luis Herrera YQI:[id number] DOB: [birth year] DOA: 09/12/2019  PCP: [name], MD  Admit date: 09/12/2019 Discharge date: 09/19/2019  Admitted From: home Discharge disposition: home   Recommendations for Outpatient Follow-Up:   1. BMP weekly until Cr normalizes 2. Cardiology follow up 3. Holding ARB 4. NO NSAIDS 5. Home health for IV abx 6. Outpatient PT 7. ID and renal follow up   Discharge Diagnosis:   Principal Problem:   Symptomatic anemia Active Problems:   Hyperlipidemia with target LDL less than 130   Essential hypertension   Aortic valve replaced   CAD (coronary artery disease)   Hypothyroidism   AKI (acute kidney injury) ([place])   Anemia   Pressure injury of skin    Discharge Condition: Improved.  Diet recommendation: Low sodium, heart healthy.    Wound care: None.  Code status: Full.   History of Present Illness:   This is a 79 year old male who was sent to the ER after outpatient labs done yesterday revealed a hemoglobin of 5.5. Today in the ER his hemoglobin is 6.3.  per visiting home nursing, the initial hemoglobin they collected 1 week ago was 11.5.  His creatinine today in the ER is 2.63.  Yesterday's creatinine was 1.2.  [facility] initial creatinine was 0.97.  He was recently hospitalized in [place] with infection of the left knee.  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  He states he had a washout of the knee and the disc was replaced.  The cultures were negative, therefore, he was discharged with a PICC line on broad-spectrum antibiotics Zosyn and vancomycin.  He was discharged with aspirin 81 mg p.o. twice daily which is an increased dosage from once daily.  He was on meloxicam which was DC'd and he was started on oxycodone.  The patient reports no evidence of bleeding.  Rhe reports brown stools.  Reports some mild nausea last night but no vomiting.  No abdominal pain, no  epigastric pain.  While was hospitalized in [place] he was transfused 2 units PRBC.  He has no history of GI bleed or stomach ulcers.  His last colonoscopy was routine done in 2020 which showed diffuse diverticulosis.  He reports a dry mouth since [facility] which is now worse.  His dry mouth woke him from sleep last night.  He denies any use of NSAIDs  He has been short of breath and lightheaded and dizzy since his surgery.  He denies any chest pain.  I was unable to review patient's recent hospitalization or [facility] notes as EPIC is still not fully available   Hospital Course by Problem:   Symptomatic Anemia- ABLA -from Hematoma in thigh  -patient is s/p 5 units since surgery (2 with [facility] and 2 here)- 1 unit given 7/29  U/S shows complex collection of unknown substance: Appearance of complex collection of unknown etiology in mid-thigh  measuring 5.3 x 3.8 x 5.3 cm.  -CT scan: Large complex hemorrhagic collection extending in the suprapatellar joint space and within the quadriceps musculature to the level of the mid femur.  -has been see by orthopedics here and will follow up in [place] -stable, no sign of infection  wheezing-- seems to worsen at night -had some squeaky/high pitched wheezing in [facility] on 8/2-- much improved after IV lasix, does still have pleural effusions but PO lasix started and these should resolve -showed patient how to use incentive spirometry correctly  AKI- most likely due to abx/and other meds -renal u/s unrevealing -monitor closely -nephrology consult appreciated -trending down -resume PO lasix  -weekly BMP  Recent left knee debridement with disc replacement -was sent home with IV Abx and PICC Line -U/s with above results -[facility] -recommended 6 weeks IV abx (cultures with no growth) -ID consult appreciated- medications changed- vanc to doxy PO  CAD (coronary artery disease)/frequent PVCs -Stable, home  meds resumed except aspirin for now -Continue amiodarone,treatment for patient's frequent PVCs  aortic valve replacement -murmur most likely due to anemia -appreciate cardiology consult- doubt hemolysis or valve misfunction  Essential hypertension -stable, home meds resumed  Hyperlipidemia with target LDL less than 130 -Stable, home meds resumed  Hypothyroidism -Stable, home meds resumed  obesity Body mass index is 30.68 kg/m.   Pressure Injury 09/13/19 Sacrum Stage 1 -  Intact skin with non-blanchable redness of a localized area usually over a bony prominence. (Active)  09/13/19 1248  Location: Sacrum  Location Orientation:   Staging: Stage 1 -  Intact skin with non-blanchable redness of a localized area usually over a bony prominence.  Wound Description (Comments):   Present on Admission: Yes        Medical Consultants:    Renal Cards   Discharge Exam:   Vitals:   09/19/19 0711 09/19/19 1236  BP:  (!) 127/44  Pulse:  66  Resp:  18  Temp:  98.1 F (36.7 C)  SpO2: 97% 94%   Vitals:   09/18/19 2153 09/19/19 0435 09/19/19 0711 09/19/19 1236  BP: (!) 164/51 (!) 141/51  (!) 127/44  Pulse: 73 68  66  Resp: 17 18  18   Temp: 98 F (36.7 C) 97.8 F (36.6 C)  98.1 F (36.7 C)  TempSrc: Oral Oral  Oral  SpO2: 97% 96% 97% 94%  Weight:      Height:        General exam: Appears calm and comfortable.    The results of significant diagnostics from this hospitalization (including imaging, microbiology, ancillary and laboratory) are listed below for reference.     Procedures and Diagnostic Studies:   US RENAL  Result Date: 09/13/2019 CLINICAL DATA:  Acute kidney injury EXAM: RENAL / URINARY TRACT ULTRASOUND COMPLETE COMPARISON:  None. FINDINGS: Right Kidney: Renal measurements: 11.8 x 5.7 x 3.7 cm = volume: 129.5 mL . Echogenicity and renal cortical thickness are within normal limits. No mass, perinephric fluid, or hydronephrosis visualized. No  sonographically demonstrable calculus or ureterectasis. Left Kidney: Renal measurements: 11.3 x 5.3 x 3.6 cm = volume: 113.2 mL. Echogenicity and renal thickness are within normal limits. No mass, perinephric fluid, or hydronephrosis visualized. No sonographically demonstrable calculus or ureterectasis. Bladder: Appears normal for degree of bladder distention. Other: None. IMPRESSION: Study within normal limits. Electronically Signed   By: Lowella Grip III M.D.   On: 09/13/2019 06:17   CT FEMUR LEFT WO CONTRAST  Result Date: 09/13/2019 CLINICAL DATA:  New soft tissue mass, spontaneous hemorrhage EXAM: CT OF THE LOWER LEFT EXTREMITY WITHOUT CONTRAST TECHNIQUE: Multidetector CT imaging of the lower left extremity was performed according to the standard protocol. COMPARISON:  None. 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 No periprosthetic lucency or fracture is identified. The joint spaces appear to be well maintained. There is complex hemorrhagic collections seen within the suprapatellar joint space extending superiorly. Ligaments Suboptimally assessed by CT. Muscles and Tendons Complex hyperdense hemorrhagic collection within  the anterior quadriceps musculature measuring approximately 9.7 x 4.8 by 18.9 cm. The collection contains small foci air is well and extends to the level of the mid femur. The remainder of the muscles appear to be intact. The quadriceps and patellar tendons are intact. Soft tissues There is diffuse subcutaneous edema and skin thickening seen surrounding the mid to lower femur. No subcutaneous emphysema within the soft tissues. Scattered vascular calcifications are noted. IMPRESSION: Large complex hemorrhagic collection extending in the suprapatellar joint space and within the quadriceps musculature to the level of the mid femur. There are small foci of air seen within the collection and this could represent  infected hematoma. Diffuse subcutaneous edema and skin thickening which could be due to cellulitis. No subcutaneous emphysema. No acute osseous abnormality. Electronically Signed   By: Prudencio Pair M.D.   On: 09/13/2019 19:34   DG CHEST PORT 1 VIEW  Result Date: 09/13/2019 CLINICAL DATA:  PICC placement. EXAM: PORTABLE CHEST 1 VIEW COMPARISON:  04/28/2018 FINDINGS: New left-sided PICC has its tip projecting in the lower superior vena cava. Stable changes from previous cardiac surgery. No mediastinal or hilar masses or evidence of adenopathy. Clear lungs.  No convincing pleural effusion or pneumothorax. Skeletal structures are grossly intact. IMPRESSION: 1. No acute cardiopulmonary disease. 2. Well-positioned left-sided PICC Electronically Signed   By: Lajean Manes M.D.   On: 09/13/2019 09:37   VAS [place] LOWER EXTREMITY VENOUS (DVT)  Result Date: 09/13/2019  Lower Venous DVTStudy Indications: Swelling.  Risk Factors: Surgery Per H&P note- recent remote LT knee replacement which became infected, was treated in [place] with washout, disc replacement, and antibiotics. Limitations: Poor ultrasound/tissue interface, swelling, bandages and body habitus. Comparison Study: No prior studies. Performing Technologist: Darlin Coco  Examination Guidelines: A complete evaluation includes B-mode imaging, spectral Doppler, color Doppler, and power Doppler as needed of all accessible portions of each vessel. Bilateral testing is considered an integral part of a complete examination. Limited examinations for reoccurring indications may be performed as noted. The reflux portion of the exam is performed with the patient in reverse Trendelenburg.  +-----+---------------+---------+-----------+----------+--------------+ RIGHTCompressibilityPhasicitySpontaneityPropertiesThrombus Aging +-----+---------------+---------+-----------+----------+--------------+ CFV  Full           Yes      Yes                                  +-----+---------------+---------+-----------+----------+--------------+   +---------+---------------+---------+-----------+----------------+-------------+ LEFT     CompressibilityPhasicitySpontaneityProperties      Thrombus                                                                  Aging         +---------+---------------+---------+-----------+----------------+-------------+ CFV      Full           Yes      Yes                                      +---------+---------------+---------+-----------+----------------+-------------+ SFJ      Full                                                             +---------+---------------+---------+-----------+----------------+-------------+  FV Prox  Full                                                             +---------+---------------+---------+-----------+----------------+-------------+ FV Mid                  Yes      Yes        Patent by color.                                                          Unable to                                                                 visualize                                                                 compression due                                                           to edema/tissue                                                           properties.                   +---------+---------------+---------+-----------+----------------+-------------+ FV DistalFull           Yes      Yes                                      +---------+---------------+---------+-----------+----------------+-------------+ PFV      Full                                                             +---------+---------------+---------+-----------+----------------+-------------+ POP      Full           Yes      Yes                                      +---------+---------------+---------+-----------+----------------+-------------+  PTV                      Yes      Yes        Patent by color.                                                          Unable to                                                                 visualize                                                                 compression due                                                           to edema/tissue                                                           properties.                   +---------+---------------+---------+-----------+----------------+-------------+ PERO                    Yes      Yes        Patent by color.                                                          Unable to                                                                 visualize                                                                 compression due  to edema/tissue                                                           properties.                   +---------+---------------+---------+-----------+----------------+-------------+     Summary: RIGHT: - No evidence of common femoral vein obstruction.  LEFT: - There is no evidence of deep vein thrombosis in the lower extremity. However, portions of this examination were limited- see technologist comments above. -Appearance of complex collection of unknown etiology in mid-thigh measuring 5.3 x 3.8 x 5.3 cm.  - A cystic structure is found in the popliteal fossa.  *See table(s) above for measurements and observations. Electronically signed by Curt Jews MD on 09/13/2019 at 5:17:14 PM.    Final    ECHOCARDIOGRAM LIMITED  Result Date: 09/14/2019    ECHOCARDIOGRAM LIMITED REPORT   Patient Name:   DAEQUAN KOZMA Date of Exam: 09/13/2019 Medical Rec #:  [id number]    Height:       71.0 in Accession #:    [id number]   Weight:       220.0 lb Date of Birth:  06/04/1940     BSA:          2.196 m Patient  Age:    62 years     BP:           123/42 mmHg Patient Gender: M            HR:           64 bpm. Exam Location:  Inpatient Procedure: Limited Echo, Cardiac Doppler and Color Doppler Indications:    Aortic valve disorder  History:        Patient has prior history of Echocardiogram examinations, most                 recent 04/13/2018. CAD, Aortic Valve Disease, Arrythmias:PVC;                 Risk Factors:Dyslipidemia and Hypertension. S/p aortic valve                 replacement.                 Aortic Valve: bioprosthetic valve is present in the aortic                 position.  Sonographer:    Clayton Lefort RDCS (AE) Referring Phys: [place]  1. Left ventricular ejection fraction, by estimation, is 60 to 65%. The left ventricle has normal function. There is moderate left ventricular hypertrophy.  2. Right ventricular systolic function is normal. The right ventricular size is normal. There is moderately elevated pulmonary artery systolic pressure.  3. Mild mitral valve regurgitation.  4. Bioprosthetic aortic valve leaflets not well-visualized but appear thickened. The mean gradient has increased from 14.8 mmHg 03/2018 to 32.7 mmHg now. The aortic valve has been repaired/replaced. Aortic valve regurgitation is mild. Moderate aortic valve stenosis. There is a bioprosthetic valve present in the aortic position. Aortic valve area, by VTI measures 1.21 cm. Aortic valve mean gradient measures 32.7 mmHg. Aortic valve Vmax measures 3.96 m/s.  5. Aortic dilatation noted. There is mild dilatation of the ascending aorta.  6. The inferior  vena cava is dilated in size with >50% respiratory variability, suggesting right atrial pressure of 8 mmHg. FINDINGS  Left Ventricle: Left ventricular ejection fraction, by estimation, is 60 to 65%. The left ventricle has normal function. There is moderate left ventricular hypertrophy. Right Ventricle: The right ventricular size is normal. No increase in right ventricular  wall thickness. Right ventricular systolic function is normal. There is moderately elevated pulmonary artery systolic pressure. The tricuspid regurgitant velocity is 3.19 m/s, and with an assumed right atrial pressure of 8 mmHg, the estimated right ventricular systolic pressure is 16.1 mmHg. Mitral Valve: Moderate mitral annular calcification. Mild mitral valve regurgitation. Tricuspid Valve: Tricuspid valve regurgitation is trivial. Aortic Valve: Bioprosthetic aortic valve leaflets not well-visualized but appear thickened. The mean gradient has increased from 14.8 mmHg 03/2018 to 32.7 mmHg now. The aortic valve has been repaired/replaced. Aortic valve regurgitation is mild. Aortic regurgitation PHT measures 268 msec. Moderate aortic stenosis is present. Aortic valve mean gradient measures 32.7 mmHg. Aortic valve peak gradient measures 62.8 mmHg. Aortic valve area, by VTI measures 1.21 cm. There is a bioprosthetic valve present in  the aortic position. Aorta: Aortic dilatation noted. There is mild dilatation of the ascending aorta. Venous: The inferior vena cava is dilated in size with greater than 50% respiratory variability, suggesting right atrial pressure of 8 mmHg. LEFT VENTRICLE PLAX 2D LVIDd:         5.50 cm LVIDs:         3.60 cm LV PW:         1.40 cm LV IVS:        1.30 cm LVOT diam:     2.40 cm LV SV:         119 LV SV Index:   54 LVOT Area:     4.52 cm  IVC IVC diam: 2.20 cm LEFT ATRIUM         Index LA diam:    4.20 cm 1.91 cm/m  AORTIC VALVE AV Area (Vmax):    1.20 cm AV Area (Vmean):   1.36 cm AV Area (VTI):     1.21 cm AV Vmax:           396.33 cm/s AV Vmean:          267.000 cm/s AV VTI:            0.989 m AV Peak Grad:      62.8 mmHg AV Mean Grad:      32.7 mmHg LVOT Vmax:         105.00 cm/s LVOT Vmean:        80.200 cm/s LVOT VTI:          0.264 m LVOT/AV VTI ratio: 0.27 AI PHT:            268 msec  AORTA Ao Root diam: 3.60 cm Ao Asc diam:  3.80 cm TRICUSPID VALVE TR Peak grad:   40.7 mmHg  TR Vmax:        319.00 cm/s  SHUNTS Systemic VTI:  0.26 m Systemic Diam: 2.40 cm Skeet Latch MD Electronically signed by Skeet Latch MD Signature Date/Time: 09/14/2019/5:55:36 AM    Final      Labs:   Basic Metabolic Panel: Recent Labs  Lab 09/15/19 0752 09/15/19 0960 09/16/19 0415 09/16/19 0415 09/17/19 0407 09/17/19 0407 09/18/19 0434 09/19/19 0332  NA 145  --  147*  --  144  --  144 142  K 3.5   < > 3.5   < >  3.5   < > 3.6 3.9  CL 111  --  114*  --  112*  --  113* 107  CO2 24  --  24  --  24  --  25 25  GLUCOSE 99  --  105*  --  113*  --  100* 111*  BUN 55*  --  56*  --  49*  --  46* 44*  CREATININE 2.66*  --  2.80*  --  2.53*  --  2.35* 2.31*  CALCIUM 7.8*  --  7.7*  --  7.8*  --  8.0* 7.8*  PHOS  --   --   --   --  3.5  --  3.4 3.5   < > = values in this interval not displayed.   GFR Estimated Creatinine Clearance: 31.7 mL/min (A) (by C-G formula based on SCr of 2.31 mg/dL (H)). Liver Function Tests: Recent Labs  Lab 09/13/19 0808 09/17/19 0407 09/18/19 0434 09/19/19 0332  AST 47*  --   --   --   ALT 26  --   --   --   ALKPHOS 50  --   --   --   BILITOT 1.7*  --   --   --   PROT 5.2*  --   --   --   ALBUMIN 2.4* 2.4* 2.3* 2.2*   No results for input(s): LIPASE, AMYLASE in the last 168 hours. No results for input(s): AMMONIA in the last 168 hours. Coagulation profile Recent Labs  Lab 09/12/19 1707  INR 1.2    CBC: Recent Labs  Lab 09/14/19 0423 09/14/19 0423 09/14/19 2300 09/15/19 0752 09/16/19 0415 09/17/19 0407 09/18/19 0434  WBC 14.9*  --   --  14.9* 12.8* 10.6* 10.0  HGB 7.7*   < > 8.6* 8.7* 8.5* 8.9* 9.0*  HCT 24.7*   < > 27.8* 28.0* 27.8* 29.3* 29.8*  MCV 102.1*  --   --  99.3 102.2* 102.1* 102.8*  PLT 265  --   --  264 242 245 227   < > = values in this interval not displayed.   Cardiac Enzymes: Recent Labs  Lab 09/14/19 0423  CKTOTAL 870*   BNP: Invalid input(s): POCBNP CBG: No results for input(s): GLUCAP in the last  168 hours. D-Dimer No results for input(s): DDIMER in the last 72 hours. Hgb A1c No results for input(s): HGBA1C in the last 72 hours. Lipid Profile No results for input(s): CHOL, HDL, LDLCALC, TRIG, CHOLHDL, LDLDIRECT in the last 72 hours. Thyroid function studies No results for input(s): TSH, T4TOTAL, T3FREE, THYROIDAB in the last 72 hours.  Invalid input(s): FREET3 Anemia work up No results for input(s): VITAMINB12, FOLATE, FERRITIN, TIBC, IRON, RETICCTPCT in the last 72 hours. Microbiology Recent Results (from the past 240 hour(s))  SARS Coronavirus 2 by RT PCR (hospital order, performed in [facility] hospital lab) Nasopharyngeal Nasopharyngeal Swab     Status: None   Collection Time: 09/12/19  6:55 PM   Specimen: Nasopharyngeal Swab  Result Value Ref Range Status   SARS Coronavirus 2 NEGATIVE NEGATIVE Final    Comment: (NOTE) SARS-CoV-2 target nucleic acids are NOT DETECTED.  The SARS-CoV-2 RNA is generally detectable in upper and lower respiratory specimens during the acute phase of infection. The lowest concentration of SARS-CoV-2 viral copies this assay can detect is 250 copies / mL. A negative result does not preclude SARS-CoV-2 infection and should not be used as the sole basis for treatment or other  patient management decisions.  A negative result may occur with improper specimen collection / handling, submission of specimen other than nasopharyngeal swab, presence of viral mutation(s) within the areas targeted by this assay, and inadequate number of viral copies (<250 copies / mL). A negative result must be combined with clinical observations, patient history, and epidemiological information.  Fact Sheet for Patients:   StrictlyIdeas.no  Fact Sheet for Healthcare Providers: BankingDealers.co.za  This test is not yet approved or  cleared by the [place] FDA and has been authorized for detection and/or diagnosis  of SARS-CoV-2 by FDA under an Emergency Use Authorization (EUA).  This EUA will remain in effect (meaning this test can be used) for the duration of the COVID-19 declaration under Section 564(b)(1) of the Act, 21 U.S.C. section 360bbb-3(b)(1), unless the authorization is terminated or revoked sooner.  Performed at [facility], [place] [address], [place], [place] [address]      Discharge Instructions:   Discharge Instructions    [facility] pharmacist to adjust dose for Vancomycin, Aminoglycosides and other anti-infective therapies as requested by physician.   Complete by: As directed    [facility] to provide Cath Flo 55m   Complete by: As directed    Administer for PICC line occlusion and as ordered by physician for other access device issues.   Anaphylaxis Kit: Provided to treat any anaphylactic reaction to the medication being provided to the patient if First Dose or when requested by physician   Complete by: As directed    Epinephrine 128mml vial / amp: Administer 0.55m41m0.55ml46mubcutaneously once for moderate to severe anaphylaxis, nurse to call physician and pharmacy when reaction occurs and call 911 if needed for immediate care   Diphenhydramine 50mg15mIV vial: Administer 25-50mg 29mM PRN for first dose reaction, rash, itching, mild reaction, nurse to call physician and pharmacy when reaction occurs   Sodium Chloride 0.9% NS 500ml I32mdminister if needed for hypovolemic blood pressure drop or as ordered by physician after call to physician with anaphylactic reaction   Change dressing on IV access line weekly and PRN   Complete by: As directed    Diet - low sodium heart healthy   Complete by: As directed    Discharge wound care:   Complete by: As directed    Discharge wound care:   Complete by: As directed    Nurse will show you prior to d/c   Flush IV access with Sodium Chloride 0.9% and Heparin 10 units/ml or 100 units/ml   Complete by: As  directed    Home infusion instructions - [facility]   Complete by: As directed    Instructions: Flush IV access with Sodium Chloride 0.9% and Heparin 10units/ml or 100units/ml   Change dressing on IV access line: Weekly and PRN   Instructions Cath Flo 2mg: Ad54mister for PICC Line occlusion and as ordered by physician for other access device   [facility] pharmacist to adjust dose for: Vancomycin, Aminoglycosides and other anti-infective therapies as requested by physician   Increase activity slowly   Complete by: As directed    Method of administration may be changed at the discretion of home infusion pharmacist based upon assessment of the patient and/or caregiver's ability to self-administer the medication ordered   Complete by: As directed      Allergies as of 09/19/2019      Reactions   Sulfamethoxazole-trimethoprim Other (See Comments)   Stephen Carlyn Reicherte  Medication List    STOP taking these medications   amLODipine-olmesartan 10-40 MG tablet Commonly known as: Azor   amLODipine-olmesartan 5-40 MG tablet Commonly known as: AZOR   aspirin 81 MG tablet   atorvastatin 80 MG tablet Commonly known as: LIPITOR   ibuprofen 800 MG tablet Commonly known as: ADVIL   mupirocin ointment 2 % Commonly known as: BACTROBAN   olmesartan 40 MG tablet Commonly known as: BENICAR     TAKE these medications   acetaminophen 500 MG tablet Commonly known as: TYLENOL Take 1,000 mg by mouth as needed for moderate pain.   amiodarone 200 MG tablet Commonly known as: PACERONE Take 0.5 tablets (100 mg total) by mouth daily.   amLODipine 10 MG tablet Commonly known as: NORVASC Take 1 tablet (10 mg total) by mouth daily. Start taking on: September 20, 2019   carvedilol 6.25 MG tablet Commonly known as: COREG Take 1 tablet (6.25 mg total) by mouth 2 (two) times daily.   cefTRIAXone  IVPB Commonly known as: ROCEPHIN Inject 2 g into the vein daily.  Indication:  Prosthetic joint infection First Dose: no Last Day of Therapy:  10/17/2019 Labs - Once weekly:  CBC/D and BMP, Labs - Every other week:  ESR and CRP Method of administration: IV Push Method of administration may be changed at the discretion of home infusion pharmacist based upon assessment of the patient and/or caregiver's ability to self-administer the medication ordered.   doxycycline 100 MG tablet Commonly known as: ADOXA Take 1 tablet (100 mg total) by mouth 2 (two) times daily.   ezetimibe 10 MG tablet Commonly known as: ZETIA Take 1 tablet (10 mg total) by mouth daily. What changed: how much to take   furosemide 40 MG tablet Commonly known as: LASIX TAKE 1 TABLET BY MOUTH EVERY DAY . TAKE ADDITIONAL 40MG AS NEEDED FOR SWELLING AND OR WEIGHT What changed:   how much to take  how to take this  when to take this  additional instructions   hydrALAZINE 25 MG tablet Commonly known as: APRESOLINE Take 1 tablet (25 mg total) by mouth 2 (two) times daily.   ipratropium-albuterol 0.5-2.5 (3) MG/3ML Soln Commonly known as: DUONEB Take 3 mLs by nebulization every 4 (four) hours as needed.   levothyroxine 50 MCG tablet Commonly known as: Synthroid Take 1 tablet (50 mcg total) by mouth daily before breakfast.   multivitamin with minerals Tabs tablet Take 1 tablet by mouth daily.   omeprazole 40 MG capsule Commonly known as: PRILOSEC Take 40 mg by mouth daily.   oxyCODONE 5 MG immediate release tablet Commonly known as: Oxy IR/ROXICODONE Take 5 mg by mouth as needed for pain.            Durable Medical Equipment  (From admission, onward)         Start     Ordered   09/19/19 1344  For home use only DME Nebulizer machine  Once       Question Answer Comment  Patient needs a nebulizer to treat with the following condition Wheezing   Length of Need 6 Months      09/19/19 1343           Discharge Care Instructions  (From admission, onward)           Start     Ordered   09/19/19 0000  Change dressing on IV access line weekly and PRN  (Home infusion instructions - [facility] )  09/19/19 1349   09/19/19 0000  Discharge wound care:        09/19/19 1349   09/19/19 0000  Discharge wound care:       Comments: Nurse will show you prior to d/c   09/19/19 1349          Follow-up Information    Spencer Callas, NP Follow up.   Specialty: Infectious Diseases Why: 8/31 at 9:45 am. Please call to reschedule if you are unable to make this appointment.  Contact information: [place] [address] [phone]        Fay Records, MD Follow up.   Specialty: Cardiology Why: will make arrangements for follow up Contact information: [place] [address] [phone]                Time coordinating discharge: 35 min  Signed:  Geradine Girt DO  [facility] 09/19/2019, 1:50 PM

## 2019-09-19 NOTE — Progress Notes (Signed)
Patient discharged home via wheelchair with wife. ?

## 2019-09-19 NOTE — Progress Notes (Signed)
Discharge teaching complete. Meds, diet, activity, follow up appointments reviewed and all questions answered. Meds brought to bedside and copy of instructions given to patient. Patient and wife educated on sacral dressing change. Patient waiting on nebulizer to be delivered to room.

## 2019-09-19 NOTE — Discharge Instructions (Signed)
HIGHEST CR was 2.91-- today it is 2.31.  His normal is around 1.  Arrangements will be made for nephrology follow up.  Also recommending weekly BMPs until normal.  Dr. Harrington Challenger to make follow up arrangements for next week Rande Lawman if who I was referencing

## 2019-09-20 ENCOUNTER — Telehealth: Payer: Self-pay

## 2019-09-20 DIAGNOSIS — T8454XA Infection and inflammatory reaction due to internal left knee prosthesis, initial encounter: Secondary | ICD-10-CM | POA: Diagnosis not present

## 2019-09-20 NOTE — Telephone Encounter (Signed)
Pt called the office state that he just got discharged from the hospital on 09/19/2019.Pt state that he was taking breathing treatments while at the hospital and was given a nebulizer machine with no medicine. Pt request for nebulizer solution ASAP so he can continue  Treatments at home, please advise

## 2019-09-20 NOTE — Telephone Encounter (Signed)
Left a detailed message for pt regarding prescription for ipratropium ( Duoneb) advised pt that Rx was sent to the [facility] outpatient pharmacy at his discharge.

## 2019-09-21 ENCOUNTER — Telehealth: Payer: Self-pay | Admitting: Infectious Diseases

## 2019-09-21 NOTE — Telephone Encounter (Signed)
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  Patient is now currently on IV Ceftriaxone and PO Doxycycline - from what I can see he would like to follow up with ID here in [place]. Hinton Dyer will kindly send notes from his care thus far. 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    Janene Madeira, MSN, NP-C [facility] [email or web address] Pager: [phone] Office: [phone] [place]: [phone]

## 2019-09-21 NOTE — Telephone Encounter (Deleted)
-----   Message from Landis Gandy, RN sent at 09/21/2019 10:57 AM EDT ----- Regarding: [facility] also taking care of [facility]? Got a voicemail from [employer] (?), NP with [facility] asking for a call back, said she is also working with this patient? ASked to speak with you. Cell [phone] can't see [facility] for some reason and def don't see her name.  I hesitated putting this in the chart. Thanks!  Sharyn Lull

## 2019-09-22 ENCOUNTER — Telehealth: Payer: Self-pay | Admitting: Internal Medicine

## 2019-09-22 NOTE — Telephone Encounter (Signed)
° ° °  Pt called, he said he was discharge at the hospital and needs follow up with DR. Ross. Gave first available appt, he said he needs a sooner appt, gave APP option but he only want to see DR. Ross and ask to message [employer]. He wants a callback from her

## 2019-09-25 ENCOUNTER — Encounter: Payer: Self-pay | Admitting: Infectious Diseases

## 2019-09-25 ENCOUNTER — Telehealth: Payer: Self-pay | Admitting: Internal Medicine

## 2019-09-25 DIAGNOSIS — R262 Difficulty in walking, not elsewhere classified: Secondary | ICD-10-CM | POA: Diagnosis not present

## 2019-09-25 DIAGNOSIS — M25662 Stiffness of left knee, not elsewhere classified: Secondary | ICD-10-CM | POA: Diagnosis not present

## 2019-09-25 DIAGNOSIS — T8454XA Infection and inflammatory reaction due to internal left knee prosthesis, initial encounter: Secondary | ICD-10-CM | POA: Diagnosis not present

## 2019-09-25 DIAGNOSIS — R531 Weakness: Secondary | ICD-10-CM | POA: Diagnosis not present

## 2019-09-25 NOTE — Telephone Encounter (Signed)
I spoke with patient.  He is asking about hospital follow up.  Does not want to see APP.  Wants to see Dr Harrington Challenger.  I told him I would send message to Dr Harrington Challenger' nurse and she would call him back when she is in the office later this week.

## 2019-09-25 NOTE — Telephone Encounter (Signed)
Patient calling to speak with nurse. He states he was recently released from the hospital and has some questions.

## 2019-09-29 ENCOUNTER — Encounter: Payer: Self-pay | Admitting: Internal Medicine

## 2019-09-29 DIAGNOSIS — T8454XA Infection and inflammatory reaction due to internal left knee prosthesis, initial encounter: Secondary | ICD-10-CM | POA: Diagnosis not present

## 2019-09-29 NOTE — Telephone Encounter (Signed)
Patient calling back.   °

## 2019-09-29 NOTE — Telephone Encounter (Signed)
error 

## 2019-10-02 ENCOUNTER — Telehealth: Payer: Self-pay | Admitting: Internal Medicine

## 2019-10-02 ENCOUNTER — Encounter: Payer: Self-pay | Admitting: Infectious Diseases

## 2019-10-02 DIAGNOSIS — T8454XA Infection and inflammatory reaction due to internal left knee prosthesis, initial encounter: Secondary | ICD-10-CM | POA: Diagnosis not present

## 2019-10-02 NOTE — Telephone Encounter (Signed)
My chart message to patient to see if he is able to come this Thursday 10:20 am w Dr. Harrington Challenger.

## 2019-10-02 NOTE — Telephone Encounter (Signed)
[facility] is calling requesting a call from [facility] in regards to being worked in for a hospital f/u with Dr. Harrington Challenger. Please advise.

## 2019-10-02 NOTE — Telephone Encounter (Signed)
Patient has been scheduled

## 2019-10-04 ENCOUNTER — Telehealth: Payer: Self-pay | Admitting: Family Medicine

## 2019-10-04 ENCOUNTER — Telehealth: Payer: Self-pay

## 2019-10-04 NOTE — Telephone Encounter (Signed)
Spoke to patient- no appts left today so he states he wants something called in today.  He states he gets antibiotic shots from the home health nurse and the nurse told him that the antibiotics is what has called the yeast infection.  He is still requesting a cream to be called in.

## 2019-10-04 NOTE — Telephone Encounter (Signed)
Patient called and advised to contact PCP. States yeast infection is around his groin back to his buttocks. Would prefer to try something OTC. [place]

## 2019-10-04 NOTE — Telephone Encounter (Signed)
Unfortunately he has never been seen at our clinic yet so I cannot prescribe yet  Does he have a PCP? Otherwise more detail as to where his yeast is so I can recommend some over the counter therapy for him until my appt with him.

## 2019-10-04 NOTE — Telephone Encounter (Signed)
Patient called office today complaining of yeast infection. States that home health RN advised patient call our office for prescription. [facility] [address]. States this has been going on two weeks now. Will forward message. [place]

## 2019-10-04 NOTE — Telephone Encounter (Signed)
I would have him start some Miconazole over the counter cream applied to the rash 2x daily. Keep the area clean and pat dry.  Try to stay inside with cool temperatures and out of the heat. Change undergarments if they become wet / sweaty.

## 2019-10-04 NOTE — Telephone Encounter (Signed)
Relayed message to patient

## 2019-10-04 NOTE — Telephone Encounter (Signed)
Left pt a message to call the office for a virtual visit

## 2019-10-04 NOTE — Telephone Encounter (Signed)
Pt call and stated he  had a knee replacement and was taken antibiotic and now have a yeast infection and want something call in for it at  [id number] [place], [place] Phone:  [phone]  Fax:  [phone]

## 2019-10-05 ENCOUNTER — Encounter: Payer: Self-pay | Admitting: Internal Medicine

## 2019-10-05 ENCOUNTER — Ambulatory Visit: Payer: Medicare Other | Admitting: Internal Medicine

## 2019-10-05 ENCOUNTER — Other Ambulatory Visit: Payer: Self-pay

## 2019-10-05 VITALS — BP 134/54 | HR 81 | Ht 71.0 in | Wt 219.2 lb

## 2019-10-05 DIAGNOSIS — I2510 Atherosclerotic heart disease of native coronary artery without angina pectoris: Secondary | ICD-10-CM

## 2019-10-05 LAB — CBC
Hematocrit: 32.3 % — ABNORMAL LOW (ref 37.5–51.0)
Hemoglobin: 10.4 g/dL — ABNORMAL LOW (ref 13.0–17.7)
MCH: 31.2 pg (ref 26.6–33.0)
MCHC: 32.2 g/dL (ref 31.5–35.7)
MCV: 97 fL (ref 79–97)
Platelets: 200 10*3/uL (ref 150–450)
RBC: 3.33 x10E6/uL — ABNORMAL LOW (ref 4.14–5.80)
RDW: 15.1 % (ref 11.6–15.4)
WBC: 9 10*3/uL (ref 3.4–10.8)

## 2019-10-05 LAB — BASIC METABOLIC PANEL
BUN/Creatinine Ratio: 17 (ref 10–24)
BUN: 27 mg/dL (ref 8–27)
CO2: 27 mmol/L (ref 20–29)
Calcium: 8.9 mg/dL (ref 8.6–10.2)
Chloride: 104 mmol/L (ref 96–106)
Creatinine, Ser: 1.58 mg/dL — ABNORMAL HIGH (ref 0.76–1.27)
GFR calc Af Amer: 48 mL/min/{1.73_m2} — ABNORMAL LOW (ref >59)
GFR calc non Af Amer: 41 mL/min/{1.73_m2} — ABNORMAL LOW (ref >59)
Glucose: 100 mg/dL — ABNORMAL HIGH (ref 65–99)
Potassium: 3.5 mmol/L (ref 3.5–5.2)
Sodium: 146 mmol/L — ABNORMAL HIGH (ref 134–144)

## 2019-10-05 NOTE — Patient Instructions (Signed)
Medication Instructions:  No changes today  *If you need a refill on your cardiac medications before your next appointment, please call your pharmacy*   Lab Work: Today: bmet, cbc  If you have labs (blood work) drawn today and your tests are completely normal, you will receive your results only by: Marland Kitchen MyChart Message (if you have MyChart) OR . A paper copy in the mail If you have any lab test that is abnormal or we need to change your treatment, we will call you to review the results.   Testing/Procedures: none   Follow-Up: At [facility], you and your health needs are our priority.  As part of our continuing mission to provide you with exceptional heart care, we have created designated Provider Care Teams.  These Care Teams include your primary Cardiologist (physician) and Advanced Practice Providers (APPs -  Physician Assistants and Nurse Practitioners) who all work together to provide you with the care you need, when you need it.  Your next appointment:     The format for your next appointment:   In Person  Provider:   You may see [name], MD or one of the following Advanced Practice Providers on your designated Care Team:    Richardson Dopp, PA-C  Robbie Lis, [place]    Other Instructions

## 2019-10-05 NOTE — Progress Notes (Signed)
Cardiology Office Note   Date:  10/05/2019   ID:  Luis Herrera, DOB Jul 04, 1940, MRN [id number]  PCP:  [name], MD  Cardiologist:   [name], MD   F/U of CAD     History of Present Illness: Luis Herrera is a 79 y.o. 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  Post procedure he was profoundly anemic   He was transfused before discharge.Marland Kitchen     He went home and the following week his Hgb again dropped He called and I told him to go to [facility] ED    The pt was admitted  Transfused 3 additional units PRBC.   CT showed large hematoma in thigh Through episode the patient had worsening renal function   Nephrology consulted  Cr slowly came down, but not back to baseline   Echo done during admit  LVEF normal   Mean gradient across AV prosthesis was increased from previous.  (pt anemic at time)  SInce d/c the pt is slowly improving   Breathing is improved   He denies CP   Still has edema in L leg        Current Meds  Medication Sig  . acetaminophen (TYLENOL) 500 MG tablet Take 1,000 mg by mouth as needed for moderate pain.  Marland Kitchen amiodarone (PACERONE) 200 MG tablet Take 0.5 tablets (100 mg total) by mouth daily.  Marland Kitchen amLODipine (NORVASC) 10 MG tablet Take 1 tablet (10 mg total) by mouth daily.  . carvedilol (COREG) 6.25 MG tablet Take 1 tablet (6.25 mg total) by mouth 2 (two) times daily.  . cefTRIAXone (ROCEPHIN) IVPB Inject 2 g into the vein daily. Indication:  Prosthetic joint infection First Dose: no Last Day of Therapy:  10/17/2019 Labs - Once weekly:  CBC/D and BMP, Labs - Every other week:  ESR and CRP Method of administration: IV Push Method of administration may be changed at the discretion of home infusion pharmacist  based upon assessment of the patient and/or caregiver's ability to self-administer the medication ordered.  . doxycycline (ADOXA) 100 MG tablet Take 1 tablet (100 mg total) by mouth 2 (two) times daily.  Marland Kitchen ezetimibe (ZETIA) 10 MG tablet Take 1 tablet (10 mg total) by mouth daily. (Patient taking differently: Take 40 mg by mouth daily. )  . furosemide (LASIX) 40 MG tablet TAKE 1 TABLET BY MOUTH EVERY DAY . TAKE ADDITIONAL 40MG AS NEEDED FOR SWELLING AND OR WEIGHT (Patient taking differently: Take 40 mg by mouth daily. )  . hydrALAZINE (APRESOLINE) 25 MG tablet Take 1 tablet (25 mg total) by mouth 2 (two) times daily.  Marland Kitchen ipratropium-albuterol (DUONEB) 0.5-2.5 (3) MG/3ML SOLN Take 3 mLs by nebulization every 4 (four) hours as needed.  Marland Kitchen levothyroxine (SYNTHROID) 50 MCG tablet Take 1 tablet (50 mcg total) by mouth daily before breakfast.  . Multiple Vitamin (MULTIVITAMIN WITH MINERALS) TABS Take 1 tablet by mouth daily.  Marland Kitchen omeprazole (PRILOSEC) 40 MG capsule Take 40 mg by mouth daily.  Marland Kitchen oxyCODONE (OXY IR/ROXICODONE) 5 MG immediate release tablet Take 5 mg by mouth as needed for pain.     Allergies:  Sulfamethoxazole-trimethoprim   Past Medical History:  Diagnosis Date  . Aortic stenosis 2008  . Arthritis   . Blood transfusion   . Blood transfusion without reported diagnosis   . CAD (coronary artery disease) 2008  . Cataract   . Complication of anesthesia    hallucinated once  . H/O hiatal hernia   . 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 Hx of colonic polyps   . Hyperlipidemia   . Hypertension   . Thyroid disease     Past Surgical History:  Procedure Laterality Date  . AORTIC VALVE REPLACEMENT  2009   Bioprosthetic at [place]  2008  . CHOLECYSTECTOMY  1982  . COLONOSCOPY    . CORONARY ARTERY BYPASS GRAFT  2009   SVG-PDA w/ AVR  . CORRECTION HAMMER TOE  2011   BILATERAL  . GASTROCNEMIUS RECESSION   05/28/2011  . POLYPECTOMY    . TONSILLECTOMY    . TOTAL HIP ARTHROPLASTY Left 2000  . TOTAL KNEE ARTHROPLASTY  2000   left  . TOTAL KNEE ARTHROPLASTY  12/01/2011   Procedure: TOTAL KNEE ARTHROPLASTY;  Surgeon: Mauri Pole, MD;  Location: [facility];  Service: Orthopedics;  Laterality: Right;  . WEIL OSTEOTOMY  05/28/2011     Social History:  The patient  reports that he quit smoking about 46 years ago. He has never used smokeless tobacco. He reports previous alcohol use. He reports that he does not use drugs.   Family History:  The patient's family history includes Colon cancer (age of onset: 16) in his mother; Heart attack in his brother and father; Hyperlipidemia in an other family member; Hypertension in his brother, sister, and another family member.    ROS:  Please see the history of present illness. All other systems are reviewed and  Negative to the above problem except as noted.    PHYSICAL EXAM: VS:  BP (!) 134/54   Pulse 81   Ht 5' 11"  (1.803 m)   Wt 219 lb 3.2 oz (99.4 kg)   SpO2 97%   BMI 30.57 kg/m   GEN: Obese 79 year old, in no acute distress  HEENT: normal  Neck: no JVD,  Cardiac: RRR; Gr II/VI systolic murmur LSB  L Leg severely edematous  Respiratory:  clear to auscultation bilaterally, normal work of breathing GI: soft, nontender, nondistended, + BS  No hepatomegaly  MS: no deformity Moving all extremities   Skin: warm and dry, no rash Neuro:  Strength and sensation are intact Psych: euthymic mood, full affect   EKG:  EKG is not ordered today.  Echo 7.28.21  1. Left ventricular ejection fraction, by estimation, is 60 to 65%. The left ventricle has normal function. There is moderate left ventricular hypertrophy. 2. Right ventricular systolic function is normal. The right ventricular size is normal. There is moderately elevated pulmonary artery systolic pressure. 3. Mild mitral valve regurgitation. 4. Bioprosthetic aortic valve leaflets not  well-visualized but appear thickened. The mean gradient has increased from 14.8 mmHg 03/2018 to 32.7 mmHg now. The aortic valve has been repaired/replaced. Aortic valve regurgitation is mild. Moderate aortic valve stenosis. There is a bioprosthetic valve present in the aortic position. Aortic valve area, by VTI measures 1.21 cm. Aortic valve mean gradient measures 32.7 mmHg. Aortic valve Vmax measures 3.96 m/s. 5. Aortic dilatation noted. There is mild dilatation of the ascending aorta. 6. The inferior vena cava is dilated in size  with >50% respiratory variability, suggesting right atrial pressure of 8 mmHg. Lipid Panel    Component Value Date/Time   CHOL 144 12/13/2018 0832   TRIG 64 12/13/2018 0832   HDL 58 12/13/2018 0832   CHOLHDL 2.5 12/13/2018 0832   CHOLHDL 3 11/14/2015 0954   VLDL 16.8 11/14/2015 0954   LDLCALC 73 12/13/2018 0832      Wt Readings from Last 3 Encounters:  10/05/19 219 lb 3.2 oz (99.4 kg)  09/12/19 (!) 220 lb (99.8 kg)  07/11/19 235 lb (106.6 kg)      ASSESSMENT AND PLAN: 1.  Hx CAD.  Patient denies chest pain.  Myoview in 2018 without ischemia.Follow  2  AV dz  S/p AVR.   Echo during admit showed increased gradient through the valve prothseiss  BUt patient was anemic during this   Will f/u with echo by end of year      3  Hx PVCs  Keep on amiodarone  Follow labs     4  CV dz  Mild plaqing of carotids    6.  Lipids.  Keep on current regimen.  Check CBC an BMET today    Current medicines are reviewed at length with the patient today.  The patient does not have concerns regarding medicines.  Signed, [name], MD  10/05/2019 9:51 AM    [address], [place], [place]  [address] Phone: [phone]; Fax: [phone]

## 2019-10-05 NOTE — Telephone Encounter (Signed)
FYI Attempted to call pt again left a message advising that he needs an appointment for treatment.

## 2019-10-06 DIAGNOSIS — T8454XA Infection and inflammatory reaction due to internal left knee prosthesis, initial encounter: Secondary | ICD-10-CM | POA: Diagnosis not present

## 2019-10-09 ENCOUNTER — Encounter: Payer: Self-pay | Admitting: Infectious Diseases

## 2019-10-09 DIAGNOSIS — T8454XA Infection and inflammatory reaction due to internal left knee prosthesis, initial encounter: Secondary | ICD-10-CM | POA: Diagnosis not present

## 2019-10-11 DIAGNOSIS — D631 Anemia in chronic kidney disease: Secondary | ICD-10-CM | POA: Diagnosis not present

## 2019-10-11 DIAGNOSIS — I129 Hypertensive chronic kidney disease with stage 1 through stage 4 chronic kidney disease, or unspecified chronic kidney disease: Secondary | ICD-10-CM | POA: Diagnosis not present

## 2019-10-11 DIAGNOSIS — N1832 Chronic kidney disease, stage 3b: Secondary | ICD-10-CM | POA: Diagnosis not present

## 2019-10-12 ENCOUNTER — Telehealth: Payer: Self-pay | Admitting: *Deleted

## 2019-10-12 MED ORDER — FUROSEMIDE 40 MG PO TABS: ORAL_TABLET | ORAL | 3 refills | Status: DC

## 2019-10-12 NOTE — Telephone Encounter (Signed)
Flomax has small chance of causing allergic reaction in patients who have a history of sulfa allergy. Its use should be avoided if the previous sulfa reaction was severe/life threatening. 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

## 2019-10-12 NOTE — Telephone Encounter (Signed)
-----   Message from [name] V, MD sent at 10/05/2019 10:23 PM EDT ----- CBC:  Hgb is a little higher than 2 wks ago     BMET:   Cr is improved  Keep on same lasix dose    Watch salt  Can take 2 lasix every 3rd day Keep renal appt

## 2019-10-12 NOTE — Telephone Encounter (Signed)
Called the patient back with PharmD recommendations.  He took one dose of flomax last night.  Will not take further doses.  He is aware to contact his prescribing doctor if to discuss an alternative.  In the meantime, he is restarting hydralazine.

## 2019-10-12 NOTE — Telephone Encounter (Signed)
Dr. Clover Mealy gave 10 day supply of Flomax for c/o getting up during the night 6-8 times to urinate.  In doing this she had him stop hydralazine 25 mg bid.  He was instructed to stop after 5 days if symptoms do not improve.  I adv to monitor for improvement in symptoms and monitor BP during this time and call back w update.

## 2019-10-12 NOTE — Telephone Encounter (Signed)
t has been informed of results/recommendation. Was released from kidney doctor this week.  Will double lasix every 3rd day.  Is seeing ID next week.   He wants Dr. Harrington Challenger to know he is still not on aspirin or atorvastatin.  Wishes to know if he should restart.

## 2019-10-12 NOTE — Telephone Encounter (Signed)
Patient states that he forgot to tell Michalene something important. Please advise.

## 2019-10-12 NOTE — Telephone Encounter (Signed)
Patient is calling to speak with Dr. Harrington Challenger nurse

## 2019-10-13 ENCOUNTER — Telehealth: Payer: Self-pay

## 2019-10-13 DIAGNOSIS — T8454XA Infection and inflammatory reaction due to internal left knee prosthesis, initial encounter: Secondary | ICD-10-CM | POA: Diagnosis not present

## 2019-10-13 NOTE — Telephone Encounter (Signed)
I see that his cardiology group has him on some lasix. I will reach out to his cardiology team to see if they can make recommendations regarding supplementation while on diuretics.   Dr. Harrington Challenger will you please see below to help with potassium level? We have our first appointment with him next week - He was previously managed by [facility] for IV antibiotics following prosthetic joint infection requiring weekly lab follow up and see he dropped from 3.5 - 2.7.   Thank you

## 2019-10-13 NOTE — Telephone Encounter (Signed)
Spoke with Levada Dy with [facility] reports patient potassium level was 2.7 on 10/09/19. Previous on 8/16 was 3.5.  Noted patient is not currently on any supplemental potassium at this time. Routing to NP for advise.  Routing to provider for advise.  Eugenia Mcalpine

## 2019-10-16 ENCOUNTER — Encounter: Payer: Self-pay | Admitting: Infectious Diseases

## 2019-10-16 DIAGNOSIS — T8454XA Infection and inflammatory reaction due to internal left knee prosthesis, initial encounter: Secondary | ICD-10-CM | POA: Diagnosis not present

## 2019-10-16 MED ORDER — POTASSIUM CHLORIDE CRYS ER 20 MEQ PO TBCR
EXTENDED_RELEASE_TABLET | ORAL | 3 refills | Status: DC
Start: 2019-10-16 — End: 2021-09-22

## 2019-10-16 NOTE — Telephone Encounter (Signed)
Need copy of labs I would recomm lasix 20 Meq    First day take 3   Then 20 per day after that  Need BMET tomorrow if possible

## 2019-10-16 NOTE — Telephone Encounter (Signed)
I spoke w Luis Herrera. He provided the [facility] Nurse's phone number. He does not have access to his results at [facility].  Confirmed w Dr. Harrington Challenger patient will take potassium 20 meq -3 tablets today and then one tablet daily.  The potassium of 2.7 is from 823.   I spoke with the infusion company [phone] - they drew a CMET today.  They will fax results to Dr. Harrington Challenger at [phone].

## 2019-10-16 NOTE — Addendum Note (Signed)
Addended by: Rodman Key on: 10/16/2019 05:10 PM   Modules accepted: Orders

## 2019-10-17 ENCOUNTER — Telehealth: Payer: Self-pay

## 2019-10-17 ENCOUNTER — Telehealth: Payer: Self-pay | Admitting: Internal Medicine

## 2019-10-17 ENCOUNTER — Ambulatory Visit: Payer: Medicare Other | Admitting: Infectious Diseases

## 2019-10-17 ENCOUNTER — Encounter: Payer: Self-pay | Admitting: Infectious Diseases

## 2019-10-17 ENCOUNTER — Other Ambulatory Visit: Payer: Self-pay

## 2019-10-17 DIAGNOSIS — I350 Nonrheumatic aortic (valve) stenosis: Secondary | ICD-10-CM

## 2019-10-17 DIAGNOSIS — T8454XA Infection and inflammatory reaction due to internal left knee prosthesis, initial encounter: Secondary | ICD-10-CM

## 2019-10-17 DIAGNOSIS — B372 Candidiasis of skin and nail: Secondary | ICD-10-CM | POA: Diagnosis not present

## 2019-10-17 DIAGNOSIS — Z452 Encounter for adjustment and management of vascular access device: Secondary | ICD-10-CM | POA: Diagnosis not present

## 2019-10-17 DIAGNOSIS — I2510 Atherosclerotic heart disease of native coronary artery without angina pectoris: Secondary | ICD-10-CM

## 2019-10-17 DIAGNOSIS — Z8739 Personal history of other diseases of the musculoskeletal system and connective tissue: Secondary | ICD-10-CM | POA: Insufficient documentation

## 2019-10-17 DIAGNOSIS — N179 Acute kidney failure, unspecified: Secondary | ICD-10-CM

## 2019-10-17 DIAGNOSIS — R600 Localized edema: Secondary | ICD-10-CM | POA: Insufficient documentation

## 2019-10-17 DIAGNOSIS — E876 Hypokalemia: Secondary | ICD-10-CM | POA: Insufficient documentation

## 2019-10-17 MED ORDER — AMOXICILLIN-POT CLAVULANATE 875-125 MG PO TABS
1.0000 | ORAL_TABLET | Freq: Two times a day (BID) | ORAL | 2 refills | Status: DC
Start: 2019-10-17 — End: 2019-11-16

## 2019-10-17 MED ORDER — FLUCONAZOLE 150 MG PO TABS
150.0000 mg | ORAL_TABLET | ORAL | 0 refills | Status: DC
Start: 2019-10-17 — End: 2019-12-18

## 2019-10-17 MED ORDER — DOXYCYCLINE HYCLATE 100 MG PO TABS
100.0000 mg | ORAL_TABLET | Freq: Two times a day (BID) | ORAL | 2 refills | Status: DC
Start: 2019-10-17 — End: 2019-11-16

## 2019-10-17 NOTE — Assessment & Plan Note (Signed)
Note sent to cardiology team to assist with K+ replacement based on diuretic plan

## 2019-10-17 NOTE — Patient Instructions (Signed)
Please try to get some larger compression stockings.  If they won't fit will need to refer you to wound clinic to do some wraps to get rid of the swelling  Will have home health stop the IV antibiotic and pull the picc line out   Please continue your doxycycline twice a day with a full glass of water and a meal  Starting Wednesday you will also start taking Augmentin twice a day   If you notice decreased range of motion in your left knee, chills, fevers, openings on the incision please call us back sooner for an appointment.   Please come back to see either Dr. Tommy Medal or myself in 6 weeks for follow up. Will be doing blood work at that time.   Two pills have been called in for yeast infection - take one pill when you pick it up and the second in 5-7 days The redness seems to be going away and glad that the itching has resolved. Looks like you have some scratched areas that need more time to heal up.

## 2019-10-17 NOTE — Assessment & Plan Note (Signed)
Here today to transfer care locally for management.  Culture negative from arthrocentesis and intraoperative findings. Underwent polyethylene liner exchange with I&D and device retention. He developed AKI on vancomycin in the setting of acute blood loss anemia and other nephrotoxic drugs which prompted change to Doxycycline PO. He has tolerated this well with some nausea that is improving now that his appetite is better.  Will transition ceftriaxone to PO augmentin to continue with treatment for now. Discussed cephalexin however he felt he would not do well with more doses daily. Unable to consider bactrim given sulfa allergy and SJS reaction.   While his whole left leg is still very swollen his ROM, normal/flat inflammatory markers and absence of erythema/warmth on exam today are all reassuring. They do describe it is improving but slow.   Will have him back to see either me or Dr. Tommy Medal in 6 weeks to re-evaluate with repeat ESR/CRP. He has not re-engaged in orthopedic care here locally. Would probably see if Dr. Ninfa Linden would see him in town given travel burden to [place], if we feel he needs re-evaluation for surgical management.

## 2019-10-17 NOTE — Telephone Encounter (Signed)
Spoke to patient and advised that I don't see any reason he cannot resume his atorvastatin. ASA was probably held due to anemia. I will defer to Dr. Harrington Challenger if ASA can be resumed.

## 2019-10-17 NOTE — Telephone Encounter (Signed)
Agree with starting back on lipitor  I would hold ASA until mid September   Check CBC to see that it onctinues to normalize

## 2019-10-17 NOTE — Assessment & Plan Note (Signed)
Site unremarkable, well maintained and functioning as expected. Continue care and maintenance until planned end of IV antibiotics. Home Health team to remove at completion today

## 2019-10-17 NOTE — Progress Notes (Signed)
Patient: Luis Herrera  DOB: 1940/08/03 MRN: [id number] PCP: [name], MD     Patient Active Problem List   Diagnosis Date Noted  . Infection of prosthetic left knee joint ([place]) 10/17/2019  . PICC (peripherally inserted central catheter) in place 10/17/2019  . Hypokalemia 10/17/2019  . Lower extremity edema 10/17/2019  . Intertriginous candidiasis 10/17/2019  . Pressure injury of skin 09/13/2019  . Symptomatic anemia 09/12/2019  . AKI (acute kidney injury) ([place]) 09/12/2019  . Anemia 09/12/2019  . Chest pain 04/13/2018  . Hypothyroidism 04/13/2018  . Aortic valve stenosis 09/27/2017  . Right bundle branch block 09/27/2017  . CAD (coronary artery disease) 09/27/2017  . 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 Acquired claw toe of right foot 06/04/2016  . Class 1 obesity with body mass index (BMI) of 31.0 to 31.9 in adult 12/02/2011  . 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 Back pain of thoracolumbar region 09/01/2010  . Hyperlipidemia with target LDL less than 130 04/10/2010  . Essential hypertension 04/10/2010  . Aortic valve replaced 04/10/2010     Subjective:   Chief Complaint  Patient presents with  . Follow-up    Has rash around genitalia, OTC cream did not work, raised spot on both legs     HPI: Luis Herrera is a 79 y.o. male transferring care here from [place] after undergoing revision of left total knee arthroplasty on 09/01/2019 with Dr. Tessie Fass.  He has a history of bilateral knee replacements in 2013. Also of note he has a bioprosthetic aortic valve replacement as well. In early July he started noticing swelling of the left knee. Dr. French Ana performed arthrocentesis of the knee with WBC 27,000 84% PMNs. No cultures were done I can see at that time. Due to concern over infection he was referred to Dr. Tessie Fass for further management. No fevers, chills or malaise.   He apparently sustained some trauma to the knee back on Memorial Day Weekend that  resulted in a small abrasion to the contralateral knee. Swollen initially, it resolved spontaneously.  Operative report reviewed - underwent revision of the tibial component with wound VAC application.  Intraoperative cultures did not yield any growth of an organism.   He was started empirically on vancomycin and cefepime --> changed to Vancomycin and Ceftriaxone. He did develop AKI on Vancomycin requiring hospitalization at [facility]. Dr. Eliseo Squires consulted with Dr. Tommy Medal and he was continued on IV ceftriaxone and transitioned to PO doxycycline at that time.  Also he was noted to have developed post op hematoma at that time which resulted in significant anemia requiring blood transfusion.   Kidney function on recent labs 1.16. ESR 7 CRP 4  Potassium 2.7 --> increased lasix recently and his cardiology team also adjusted his potassium replacement.   He has been feeling well "finally." Feels his appetite is just starting to return. They feel that the range of motion in the knee is significantly better but the swelling has not. He has always had trouble with swelling but seems to be significantly worse now in the left lower leg down to ankle. Unable to fit in his compression stockings. Some small circular areas that are red that have popped up.   He is having nausea with the doxycycline but his appetite is just now coming back. The doxy gives him some nausea but feels like he can now eat more to help with that. He has not been able to perform physical therapy outside the home yet  d/t home nursing / abx services. He plans to start soon, however.   Some increased rashes in the groin folds. Started on topical miconazole cream which helped significantly with the itching but still not cleared.   PICC line is without pain, drainage or erythema and is well maintained by [facility]. No swelling or altered sensation in affected distal extremity.    Review of Systems  Constitutional: Negative for chills and  fever.  HENT: Negative for tinnitus.   Eyes: Negative for blurred vision and photophobia.  Respiratory: Negative for cough and sputum production.   Cardiovascular: Positive for leg swelling. Negative for chest pain.  Gastrointestinal: Negative for diarrhea, nausea and vomiting.  Genitourinary: Negative for dysuria.  Musculoskeletal: Negative for falls and joint pain.  Skin: Negative for rash.  Neurological: Positive for weakness. Negative for headaches.    Past Medical History:  Diagnosis Date  . Aortic stenosis 2008  . Arthritis   . Blood transfusion   . Blood transfusion without reported diagnosis   . CAD (coronary artery disease) 2008  . Cataract   . Complication of anesthesia    hallucinated once  . H/O hiatal hernia   . 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 Hx of colonic polyps   . Hyperlipidemia   . Hypertension   . Thyroid disease     Outpatient Medications Prior to Visit  Medication Sig Dispense Refill  . acetaminophen (TYLENOL) 500 MG tablet Take 1,000 mg by mouth as needed for moderate pain.    Marland Kitchen amiodarone (PACERONE) 200 MG tablet Take 0.5 tablets (100 mg total) by mouth daily. 45 tablet 3  . amLODipine (NORVASC) 10 MG tablet Take 1 tablet (10 mg total) by mouth daily. 30 tablet 0  . carvedilol (COREG) 6.25 MG tablet Take 1 tablet (6.25 mg total) by mouth 2 (two) times daily. 180 tablet 3  . ezetimibe (ZETIA) 10 MG tablet Take 1 tablet (10 mg total) by mouth daily. (Patient taking differently: Take 40 mg by mouth daily. ) 90 tablet 3  . furosemide (LASIX) 40 MG tablet TAKE 1 TABLET BY MOUTH EVERY DAY . TAKE ADDITIONAL 40MG EVERY THIRD DAY. 180 tablet 3  . hydrALAZINE (APRESOLINE) 25 MG tablet Take 1 tablet (25 mg total) by mouth 2 (two) times daily. 180 tablet 3  . ipratropium-albuterol (DUONEB) 0.5-2.5 (3) MG/3ML SOLN Take 3 mLs by nebulization every 4 (four) hours as needed. 120 mL 0  . levothyroxine (SYNTHROID) 50 MCG tablet Take 1 tablet (50 mcg  total) by mouth daily before breakfast. 90 tablet 3  . Multiple Vitamin (MULTIVITAMIN WITH MINERALS) TABS Take 1 tablet by mouth daily.    Marland Kitchen omeprazole (PRILOSEC) 40 MG capsule Take 40 mg by mouth daily.    Marland Kitchen oxyCODONE (OXY IR/ROXICODONE) 5 MG immediate release tablet Take 5 mg by mouth as needed for pain.    . potassium chloride SA (KLOR-CON) 20 MEQ tablet Take 3 tablets the first day, then take one tablet daily 90 tablet 3  . cefTRIAXone (ROCEPHIN) IVPB Inject 2 g into the vein daily. Indication:  Prosthetic joint infection First Dose: no Last Day of Therapy:  10/17/2019 Labs - Once weekly:  CBC/D and BMP, Labs - Every other week:  ESR and CRP Method of administration: IV Push Method of administration may be changed at the discretion of home infusion pharmacist based upon assessment of the patient and/or caregiver's ability to self-administer the medication ordered. 32 Units 0  .  doxycycline (ADOXA) 100 MG tablet Take 1 tablet (100 mg total) by mouth 2 (two) times daily. 64 tablet 0   No facility-administered medications prior to visit.     Allergies  Allergen Reactions  . Sulfamethoxazole-Trimethoprim Other (See Comments)    Carlyn Reichert Syndrome    Social History   Tobacco Use  . Smoking status: Former Smoker    Quit date: 05/25/1973    Years since quitting: 46.4  . Smokeless tobacco: Never Used  Vaping Use  . Vaping Use: Never used  Substance Use Topics  . Alcohol use: Not Currently    Comment: rare  . Drug use: No    Family History  Problem Relation Age of Onset  . Colon cancer Mother 58  . Heart attack Father   . Hypertension Other   . Hyperlipidemia Other   . Heart attack Brother   . Hypertension Sister   . Hypertension Brother   . Stroke Neg Hx   . Esophageal cancer Neg Hx   . Rectal cancer Neg Hx   . Stomach cancer Neg Hx     Objective:   Vitals:   10/17/19 0951  BP: (!) 132/52  Pulse: 83  SpO2: 97%  Weight: 217 lb (98.4 kg)   Body mass index is  30.27 kg/m.  Physical Exam Vitals reviewed.  Constitutional:      Appearance: Normal appearance. He is not ill-appearing.     Comments: Sitting in chair, appears comfortable.   HENT:     Head: Normocephalic.     Mouth/Throat:     Mouth: Mucous membranes are moist.     Pharynx: Oropharynx is clear.  Eyes:     General: No scleral icterus. Pulmonary:     Effort: Pulmonary effort is normal.  Musculoskeletal:        General: Normal range of motion.     Cervical back: Normal range of motion.     Comments: L knee as pictured - there is no warmth today on exam. He has significant swelling throughout the whole LE up to the mid thigh with pitting edema present below the knee. He has pretty good range of motion and can flex beyond 90 degrees and near fully extend to 180 degrees. There is no tenderness. The incision is healed nicely with 2 small areas of scabs present without drainage.   Skin:    General: Skin is warm and dry.     Coloration: Skin is not jaundiced or pale.     Comments: Excoriated rash to the inner groin and scrotum. Some erythema at the wound base.   Neurological:     Mental Status: He is alert and oriented to person, place, and time.  Psychiatric:        Mood and Affect: Mood normal.        Judgment: Judgment normal.       Lab Results: Lab Results  Component Value Date   WBC 9.0 10/05/2019   HGB 10.4 (L) 10/05/2019   HCT 32.3 (L) 10/05/2019   MCV 97 10/05/2019   PLT 200 10/05/2019    Lab Results  Component Value Date   CREATININE 1.58 (H) 10/05/2019   BUN 27 10/05/2019   NA 146 (H) 10/05/2019   K 3.5 10/05/2019   CL 104 10/05/2019   CO2 27 10/05/2019    Lab Results  Component Value Date   ALT 26 09/13/2019   AST 47 (H) 09/13/2019   ALKPHOS 50 09/13/2019   BILITOT 1.7 (H) 09/13/2019  Assessment & Plan:   Problem List Items Addressed This Visit      Unprioritized   PICC (peripherally inserted central catheter) in place    Site unremarkable,  well maintained and functioning as expected. Continue care and maintenance until planned end of IV antibiotics. Home Health team to remove at completion today       Lower extremity edema    He will see if he can get larger compression socks. May need referral to wound team for [facility] boot applications.  He has a few small ruptured blisters on the right leg. I worry he is at high risk for developing stasis ulcers in current state.       Intertriginous candidiasis    Mostly escoriations from scratching that need to heal. Some erythematous wound bed near scrotum. Will treat with diflucan weekly x 2.       Relevant Medications   fluconazole (DIFLUCAN) 150 MG tablet   Infection of prosthetic left knee joint ([facility])    Here today to transfer care locally for management.  Culture negative from arthrocentesis and intraoperative findings. Underwent polyethylene liner exchange with I&D and device retention. He developed AKI on vancomycin in the setting of acute blood loss anemia and other nephrotoxic drugs which prompted change to Doxycycline PO. He has tolerated this well with some nausea that is improving now that his appetite is better.  Will transition ceftriaxone to PO augmentin to continue with treatment for now. Discussed cephalexin however he felt he would not do well with more doses daily. Unable to consider bactrim given sulfa allergy and SJS reaction.   While his whole left leg is still very swollen his ROM, normal/flat inflammatory markers and absence of erythema/warmth on exam today are all reassuring. They do describe it is improving but slow.   Will have him back to see either me or Dr. Tommy Medal in 6 weeks to re-evaluate with repeat ESR/CRP. He has not re-engaged in orthopedic care here locally. Would probably see if Dr. Ninfa Linden would see him in town given travel burden to [place], if we feel he needs re-evaluation for surgical management.       Relevant Medications   fluconazole  (DIFLUCAN) 150 MG tablet   Hypokalemia    Note sent to cardiology team to assist with K+ replacement based on diuretic plan       Aortic valve stenosis    S/p AVR - important to ensure we manage this infection well to prevent valve infection. They agree.       AKI (acute kidney injury) ([place])    Resolved.          Janene Madeira, MSN, NP-C [facility] for [email or web address] Pager: [phone] Office: [phone] [place]: [phone]   10/17/19  3:28 PM

## 2019-10-17 NOTE — Assessment & Plan Note (Signed)
Resolved

## 2019-10-17 NOTE — Telephone Encounter (Signed)
Patient stated he is having his PICC line removed today and will no longer be on Heparin. Patient stated his infectious disease doctor is having him take doxycycline and amoxicillin  PO, and was wondering when he can start back on his ASA and Lipitor. Will forward to Dr. Harrington Challenger and Pharm D.

## 2019-10-17 NOTE — Assessment & Plan Note (Signed)
Mostly escoriations from scratching that need to heal. Some erythematous wound bed near scrotum. Will treat with diflucan weekly x 2.

## 2019-10-17 NOTE — Telephone Encounter (Signed)
[place] on 10/17/2019, at 2:37 p.m. Per Colletta [place] Dixon's orders I relayed for the patient's PICC to be pulled as soon as possible. This was then confirmed with [facility]. Consuelo Pandy

## 2019-10-17 NOTE — Assessment & Plan Note (Signed)
He will see if he can get larger compression socks. May need referral to wound team for [facility] boot applications.  He has a few small ruptured blisters on the right leg. I worry he is at high risk for developing stasis ulcers in current state.

## 2019-10-17 NOTE — Telephone Encounter (Signed)
TK from [facility] states she faxed over the patient's recent labs and would like to know if they were received.

## 2019-10-17 NOTE — Telephone Encounter (Signed)
    Pt said he is recently in the hospital and he has new medications and confused. He would like to speak with RN Caren Hazy

## 2019-10-17 NOTE — Assessment & Plan Note (Signed)
S/p AVR - important to ensure we manage this infection well to prevent valve infection. They agree.

## 2019-10-18 ENCOUNTER — Telehealth: Payer: Self-pay | Admitting: Internal Medicine

## 2019-10-18 DIAGNOSIS — R531 Weakness: Secondary | ICD-10-CM | POA: Diagnosis not present

## 2019-10-18 DIAGNOSIS — M25662 Stiffness of left knee, not elsewhere classified: Secondary | ICD-10-CM | POA: Diagnosis not present

## 2019-10-18 NOTE — Telephone Encounter (Signed)
Luis Herrera, patient's Physical Therapist, calling stating the patient has some swelling and would like to know if he has ever had any tests done for DVT or if there is any concern of DVT. He states his direct number is: [phone], but to try the office number first at: [phone]

## 2019-10-18 NOTE — Telephone Encounter (Signed)
Adv patient to hold on restarting aspirin. Adv he needs an appointment med September for CBC.  If stable he should restart aspirin. He is not on oral antibiotics.--Augmentin and doxycycline.

## 2019-10-18 NOTE — Telephone Encounter (Signed)
Returned call to [employer].  Left message that lab results hve not been received as of this time.

## 2019-10-18 NOTE — Telephone Encounter (Signed)
Follow up   Pt is calling back to follow up, wanted to speak with RN Caren Hazy

## 2019-10-19 NOTE — Telephone Encounter (Signed)
Returned call.  PT was with a patient.  Message left that patient has LLE US done on 09/13/19.

## 2019-10-20 DIAGNOSIS — I2510 Atherosclerotic heart disease of native coronary artery without angina pectoris: Secondary | ICD-10-CM

## 2019-10-24 ENCOUNTER — Other Ambulatory Visit: Payer: Medicare Other | Admitting: *Deleted

## 2019-10-24 ENCOUNTER — Other Ambulatory Visit: Payer: Self-pay

## 2019-10-24 DIAGNOSIS — I2510 Atherosclerotic heart disease of native coronary artery without angina pectoris: Secondary | ICD-10-CM

## 2019-10-24 NOTE — Telephone Encounter (Signed)
Patient coming today for BMET.

## 2019-10-25 LAB — BASIC METABOLIC PANEL
BUN/Creatinine Ratio: 20 (ref 10–24)
BUN: 29 mg/dL — ABNORMAL HIGH (ref 8–27)
CO2: 25 mmol/L (ref 20–29)
Calcium: 9 mg/dL (ref 8.6–10.2)
Chloride: 107 mmol/L — ABNORMAL HIGH (ref 96–106)
Creatinine, Ser: 1.43 mg/dL — ABNORMAL HIGH (ref 0.76–1.27)
GFR calc Af Amer: 54 mL/min/{1.73_m2} — ABNORMAL LOW (ref >59)
GFR calc non Af Amer: 47 mL/min/{1.73_m2} — ABNORMAL LOW (ref >59)
Glucose: 112 mg/dL — ABNORMAL HIGH (ref 65–99)
Potassium: 4.7 mmol/L (ref 3.5–5.2)
Sodium: 143 mmol/L (ref 134–144)

## 2019-10-25 LAB — CBC
Hematocrit: 30.8 % — ABNORMAL LOW (ref 37.5–51.0)
Hemoglobin: 10 g/dL — ABNORMAL LOW (ref 13.0–17.7)
MCH: 32.4 pg (ref 26.6–33.0)
MCHC: 32.5 g/dL (ref 31.5–35.7)
MCV: 100 fL — ABNORMAL HIGH (ref 79–97)
Platelets: 214 10*3/uL (ref 150–450)
RBC: 3.09 x10E6/uL — ABNORMAL LOW (ref 4.14–5.80)
RDW: 13.7 % (ref 11.6–15.4)
WBC: 8.9 10*3/uL (ref 3.4–10.8)

## 2019-10-26 DIAGNOSIS — R262 Difficulty in walking, not elsewhere classified: Secondary | ICD-10-CM | POA: Diagnosis not present

## 2019-10-26 DIAGNOSIS — R531 Weakness: Secondary | ICD-10-CM | POA: Diagnosis not present

## 2019-10-26 DIAGNOSIS — M25662 Stiffness of left knee, not elsewhere classified: Secondary | ICD-10-CM | POA: Diagnosis not present

## 2019-11-02 ENCOUNTER — Telehealth: Payer: Self-pay | Admitting: *Deleted

## 2019-11-02 DIAGNOSIS — I2510 Atherosclerotic heart disease of native coronary artery without angina pectoris: Secondary | ICD-10-CM

## 2019-11-02 DIAGNOSIS — D649 Anemia, unspecified: Secondary | ICD-10-CM

## 2019-11-02 MED ORDER — ASPIRIN EC 81 MG PO TBEC: 81.0000 mg | DELAYED_RELEASE_TABLET | Freq: Every day | ORAL | 3 refills | Status: AC

## 2019-11-02 NOTE — Telephone Encounter (Addendum)
-----   Message from [name] V, MD sent at 10/25/2019  8:30 PM EDT ----- Called patient to review labs   He saws legs are getting better slowly Kidney function continues to improve CBC relatively stable Recomm:   Start ecASA 81 mg     Check CBC and BMET and anemia panel next Friday (9/16) _______________________________________________________________________________________ My chart Message to patient to see if he is able to come in tomorrow for labs.  Aspirin added back to medication list.

## 2019-11-02 NOTE — Telephone Encounter (Signed)
Lab appointment scheduled 

## 2019-11-03 ENCOUNTER — Other Ambulatory Visit: Payer: Medicare Other | Admitting: *Deleted

## 2019-11-03 ENCOUNTER — Other Ambulatory Visit: Payer: Self-pay

## 2019-11-03 DIAGNOSIS — D649 Anemia, unspecified: Secondary | ICD-10-CM | POA: Diagnosis not present

## 2019-11-03 DIAGNOSIS — D62 Acute posthemorrhagic anemia: Secondary | ICD-10-CM | POA: Diagnosis not present

## 2019-11-03 DIAGNOSIS — I2510 Atherosclerotic heart disease of native coronary artery without angina pectoris: Secondary | ICD-10-CM | POA: Diagnosis not present

## 2019-11-04 LAB — CBC
Hematocrit: 32.2 % — ABNORMAL LOW (ref 37.5–51.0)
Hemoglobin: 10.3 g/dL — ABNORMAL LOW (ref 13.0–17.7)
MCH: 31.6 pg (ref 26.6–33.0)
MCHC: 32 g/dL (ref 31.5–35.7)
MCV: 99 fL — ABNORMAL HIGH (ref 79–97)
Platelets: 235 10*3/uL (ref 150–450)
RBC: 3.26 x10E6/uL — ABNORMAL LOW (ref 4.14–5.80)
RDW: 13.1 % (ref 11.6–15.4)
WBC: 8.3 10*3/uL (ref 3.4–10.8)

## 2019-11-04 LAB — BASIC METABOLIC PANEL
BUN/Creatinine Ratio: 18 (ref 10–24)
BUN: 26 mg/dL (ref 8–27)
CO2: 22 mmol/L (ref 20–29)
Calcium: 9.1 mg/dL (ref 8.6–10.2)
Chloride: 106 mmol/L (ref 96–106)
Creatinine, Ser: 1.42 mg/dL — ABNORMAL HIGH (ref 0.76–1.27)
GFR calc Af Amer: 54 mL/min/{1.73_m2} — ABNORMAL LOW (ref >59)
GFR calc non Af Amer: 47 mL/min/{1.73_m2} — ABNORMAL LOW (ref >59)
Glucose: 98 mg/dL (ref 65–99)
Potassium: 4.6 mmol/L (ref 3.5–5.2)
Sodium: 142 mmol/L (ref 134–144)

## 2019-11-04 LAB — FOLATE: Folate: >20 ng/mL (ref >3.0)

## 2019-11-04 LAB — IRON AND TIBC
Iron Saturation: 33 % (ref 15–55)
Iron: 56 ug/dL (ref 38–169)
Total Iron Binding Capacity: 172 ug/dL — ABNORMAL LOW (ref 250–450)
UIBC: 116 ug/dL (ref 111–343)

## 2019-11-04 LAB — VITAMIN B12: Vitamin B-12: 725 pg/mL (ref 232–1245)

## 2019-11-04 LAB — FERRITIN: Ferritin: 579 ng/mL — ABNORMAL HIGH (ref 30–400)

## 2019-11-06 ENCOUNTER — Telehealth: Payer: Self-pay

## 2019-11-06 NOTE — Telephone Encounter (Signed)
The patient has been notified of the result and verbalized understanding.  All questions (if any) were answered. Wilma Flavin, RN 11/06/2019 3:06 PM

## 2019-11-06 NOTE — Telephone Encounter (Signed)
-----   Message from Fay Records, MD sent at 11/04/2019 10:20 PM EDT ----- CBC:  HGb is 10.3   Folate, B12, iron look OK Kidney function holing steady No change in meds

## 2019-11-08 DIAGNOSIS — R262 Difficulty in walking, not elsewhere classified: Secondary | ICD-10-CM | POA: Diagnosis not present

## 2019-11-08 DIAGNOSIS — R531 Weakness: Secondary | ICD-10-CM | POA: Diagnosis not present

## 2019-11-08 DIAGNOSIS — M25662 Stiffness of left knee, not elsewhere classified: Secondary | ICD-10-CM | POA: Diagnosis not present

## 2019-11-14 DIAGNOSIS — H353131 Nonexudative age-related macular degeneration, bilateral, early dry stage: Secondary | ICD-10-CM | POA: Diagnosis not present

## 2019-11-14 DIAGNOSIS — H40003 Preglaucoma, unspecified, bilateral: Secondary | ICD-10-CM | POA: Diagnosis not present

## 2019-11-15 ENCOUNTER — Telehealth: Payer: Self-pay | Admitting: Internal Medicine

## 2019-11-15 NOTE — Telephone Encounter (Signed)
Patient would like to know if he is okay to get the covid booster shot.

## 2019-11-16 ENCOUNTER — Telehealth: Payer: Self-pay

## 2019-11-16 DIAGNOSIS — I8312 Varicose veins of left lower extremity with inflammation: Secondary | ICD-10-CM | POA: Diagnosis not present

## 2019-11-16 DIAGNOSIS — I8311 Varicose veins of right lower extremity with inflammation: Secondary | ICD-10-CM | POA: Diagnosis not present

## 2019-11-16 DIAGNOSIS — C44319 Basal cell carcinoma of skin of other parts of face: Secondary | ICD-10-CM | POA: Diagnosis not present

## 2019-11-16 DIAGNOSIS — I872 Venous insufficiency (chronic) (peripheral): Secondary | ICD-10-CM | POA: Diagnosis not present

## 2019-11-16 DIAGNOSIS — L820 Inflamed seborrheic keratosis: Secondary | ICD-10-CM | POA: Diagnosis not present

## 2019-11-16 DIAGNOSIS — Z85828 Personal history of other malignant neoplasm of skin: Secondary | ICD-10-CM | POA: Diagnosis not present

## 2019-11-16 DIAGNOSIS — L570 Actinic keratosis: Secondary | ICD-10-CM | POA: Diagnosis not present

## 2019-11-16 MED ORDER — CEPHALEXIN 500 MG PO CAPS
500.0000 mg | ORAL_CAPSULE | Freq: Two times a day (BID) | ORAL | 0 refills | Status: DC
Start: 2019-11-16 — End: 2019-12-05

## 2019-11-16 MED ORDER — DOXYCYCLINE HYCLATE 100 MG PO TABS
100.0000 mg | ORAL_TABLET | Freq: Two times a day (BID) | ORAL | 2 refills | Status: DC
Start: 2019-11-16 — End: 2019-12-05

## 2019-11-16 NOTE — Telephone Encounter (Signed)
Discussed patient getting Covid 19 booster vaccine.  He completed first round of vaccines in Feb.  He is going to get a flu shot next week so will wait until after Oct for a Covid booster.  He is getting lightheaded when sitting.  He feels this could be from the antibiotics.  His BPs are good  Most recent 111/55.  Does not usually get lower than that.  Does not occur w position changes.  Has follow up with infectious diseas on 10/18.  Is going to call to see if he can be seen sooner.

## 2019-11-16 NOTE — Telephone Encounter (Signed)
Patient is calling and states he has being feeling lightheaded intermittently daily. Patient states it does not matter if is sitting or standing. Patient feels it is coming from the antibiotics. He spoke with cardiology and was advised to reach out to our office. Patient's BP readings have been normal.  Luis Herrera

## 2019-11-16 NOTE — Telephone Encounter (Signed)
I called Luis Herrera to discuss further  He states he has noticed a significant difference in regards to dizziness since stopping the IV ceftriaxone and transitioning to p.o. Augmentin.  He says it happens pretty much every day.  He spoke with his cardiology team yesterday and they stopped his hydralazine as his blood pressure has been in the 100s over 50s.  His nausea is much better after he started taking the doxycycline with some saltine crackers twice a day.  He feels like his appetite is improved.  His knee continues to have swelling into the distal thigh above his patella.  No change in the range of motion.  He feels like it is coming along nicely though overall.  He has not followed up with orthopedics however.  I asked him to please call Dr. French Ana to schedule follow-up to evaluate and make certain that there is no concern with his knee.   We discussed options for antibiotics and I will switch his Augmentin to Keflex twice a day for suppression.  I will give him a month and see him back as scheduled in 2 weeks.  Advised to call to let me know if he has side effects to this antibiotic or any changes in his knee.

## 2019-11-16 NOTE — Telephone Encounter (Signed)
Hydralazine discontinued.

## 2019-11-16 NOTE — Addendum Note (Signed)
Addended by:  Callas on: 11/16/2019 12:00 PM   Modules accepted: Orders

## 2019-11-16 NOTE — Telephone Encounter (Signed)
I called patient   With BP in 110s/50s I would recomm cutting back Would recomm stopping hydralazine (25 bid)  Follow BP  Call back if dizziness worsens or BP skyrockets

## 2019-11-21 ENCOUNTER — Other Ambulatory Visit: Payer: Self-pay

## 2019-11-21 ENCOUNTER — Telehealth: Payer: Self-pay | Admitting: Internal Medicine

## 2019-11-21 DIAGNOSIS — I493 Ventricular premature depolarization: Secondary | ICD-10-CM

## 2019-11-21 NOTE — Telephone Encounter (Signed)
Follow Up:     Pt wanted you to call, concerning the message he received  From you today.

## 2019-11-21 NOTE — Telephone Encounter (Signed)
Returning your call.  Please call [phone].

## 2019-11-21 NOTE — Progress Notes (Signed)
Order placed for 24 hour monitor per Dr. Lovena Le prior to upcoming yearly appt.

## 2019-11-23 NOTE — Telephone Encounter (Signed)
° ° °  Pt is calling back to follow up. He wanted to speak with RN Caren Hazy

## 2019-11-24 NOTE — Telephone Encounter (Signed)
Left message for patient to call back if he needs to speak with me about anything.  Adv I did not call him and wasn't sure if he meant to ask for me or if this was about something else, but feel free to call back if needs anything.

## 2019-11-29 ENCOUNTER — Other Ambulatory Visit: Payer: Self-pay | Admitting: Internal Medicine

## 2019-11-29 DIAGNOSIS — I10 Essential (primary) hypertension: Secondary | ICD-10-CM

## 2019-12-01 NOTE — Telephone Encounter (Signed)
Several refill requests for patient, including atorvastatin and zetia that he has restarted.  - these were on hold.  I have refilled but want to make sure Dr. Harrington Challenger is aware he has restarted.  Has amlodipine-olmesartan 10-40  at home and needs refill for this.  Our records indicate he is on amlodipine 10 mg currently.  Need to clarify ok to refill amlodipine-olmesartan also.

## 2019-12-04 ENCOUNTER — Telehealth: Payer: Self-pay | Admitting: Internal Medicine

## 2019-12-04 DIAGNOSIS — N1832 Chronic kidney disease, stage 3b: Secondary | ICD-10-CM | POA: Diagnosis not present

## 2019-12-04 DIAGNOSIS — D631 Anemia in chronic kidney disease: Secondary | ICD-10-CM | POA: Diagnosis not present

## 2019-12-04 DIAGNOSIS — I129 Hypertensive chronic kidney disease with stage 1 through stage 4 chronic kidney disease, or unspecified chronic kidney disease: Secondary | ICD-10-CM | POA: Diagnosis not present

## 2019-12-04 NOTE — Telephone Encounter (Signed)
Patient continues to be lightheaded.  Saw kidney Dr. Today who suggested seeing if patient could reduce amlodipine-olmesartan to 5-20. BP at home this am 111/48, at renal office: 121/58.  He has ongoing lightheadedness.  Hydralazine was stopped at last ov with Dr. Harrington Challenger.  He is still on 2 oral antibiotics. I adv patient that for now he could plan to cut this medication in 1/2 tomorrow and I will ask Dr. Harrington Challenger for her input.  Pt has follow up with Dr. Harrington Challenger on 11/1 already scheduled.

## 2019-12-04 NOTE — Telephone Encounter (Signed)
Patients want to talk with Dr. Harrington Challenger nurse regarding blood pressure medicine

## 2019-12-04 NOTE — Telephone Encounter (Signed)
Agree   Cut back in 1/2   Follow BP and HR

## 2019-12-05 ENCOUNTER — Encounter: Payer: Self-pay | Admitting: Infectious Diseases

## 2019-12-05 ENCOUNTER — Ambulatory Visit (INDEPENDENT_AMBULATORY_CARE_PROVIDER_SITE_OTHER): Payer: Medicare Other | Admitting: Infectious Diseases

## 2019-12-05 ENCOUNTER — Other Ambulatory Visit: Payer: Self-pay

## 2019-12-05 VITALS — BP 148/71 | HR 71 | Temp 97.5°F | Wt 191.0 lb

## 2019-12-05 DIAGNOSIS — R42 Dizziness and giddiness: Secondary | ICD-10-CM

## 2019-12-05 DIAGNOSIS — T8454XA Infection and inflammatory reaction due to internal left knee prosthesis, initial encounter: Secondary | ICD-10-CM

## 2019-12-05 DIAGNOSIS — Z5181 Encounter for therapeutic drug level monitoring: Secondary | ICD-10-CM

## 2019-12-05 MED ORDER — CEPHALEXIN 500 MG PO CAPS: 500.0000 mg | ORAL_CAPSULE | Freq: Two times a day (BID) | ORAL | 2 refills | Status: DC

## 2019-12-05 MED ORDER — DOXYCYCLINE HYCLATE 100 MG PO TABS: 100.0000 mg | ORAL_TABLET | Freq: Two times a day (BID) | ORAL | 2 refills | Status: DC

## 2019-12-05 NOTE — Patient Instructions (Addendum)
Please continue taking your   Please continue the cephalexin and doxycycline twice a day. Plan to try to eat a full meal   Please schedule a return visit in 3 months

## 2019-12-05 NOTE — Assessment & Plan Note (Signed)
Given his significant weight loss backing off of some of his cardiac meds hopefully will help his dizziness.  His blood pressure is in the 353G systolic today.  No tachycardia noted.  He has follow-up with Dr. Harrington Challenger in about 2-3 weeks.  Will have him continue current antibiotics but take with a full meal (more than he is currently taking in the morning) and glass of water to ensure best chance to reduce dizziness if it is related to cephalexin.

## 2019-12-05 NOTE — Assessment & Plan Note (Signed)
Luis Herrera is doing very well on twice daily suppressive therapy with doxycycline and cephalexin.  I am not certain that the dizziness is due to the cephalexin although it seems to correlate with the morning dose.  I do think he should increase his morning meal and plan to completely finish it before taking his antibiotics to see if this helps. On clinical exam he has had significant reduction in his swelling and there is no concern for active or ongoing infection by me today.  We discussed continuing current therapy for another 3 months took it 6 months total.  At that time we will needed to make a decision regarding ongoing treatment versus stopping.  His daughter is very nervous I think about possibly stopping antibiotics with the potential to have it flare back up.  He may do well discontinuing suppressive doxycycline as I would worry most aggressively about staph including MRSA involving the knee lingering around.  We will discuss more at our follow-up in 3 months.

## 2019-12-05 NOTE — Progress Notes (Signed)
Patient: Luis Herrera  DOB: [birth year] MRN: [id number] PCP: [name], MD     Patient Active Problem List   Diagnosis Date Noted  . Dizziness 12/05/2019  . Infection of prosthetic left knee joint ([name]) 10/17/2019  . PICC (peripherally inserted central catheter) in place 10/17/2019  . Hypokalemia 10/17/2019  . Lower extremity edema 10/17/2019  . Intertriginous candidiasis 10/17/2019  . Pressure injury of skin 09/13/2019  . Symptomatic anemia 09/12/2019  . Anemia 09/12/2019  . Chest pain 04/13/2018  . Hypothyroidism 04/13/2018  . Aortic valve stenosis 09/27/2017  . Right bundle branch block 09/27/2017  . CAD (coronary artery disease) 09/27/2017  . 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 Acquired claw toe of right foot 06/04/2016  . Class 1 obesity with body mass index (BMI) of 31.0 to 31.9 in adult 12/02/2011  . 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 Back pain of thoracolumbar region 09/01/2010  . Hyperlipidemia with target LDL less than 130 04/10/2010  . Essential hypertension 04/10/2010  . Aortic valve replaced 04/10/2010      Subjective:   Chief Complaint  Patient presents with  . Follow-up      Luis Herrera is a 79 y.o. male here with his daughter for follow-up on left prosthetic knee infection.    Brief Hx - went to [facility] after undergoing revision of left total knee arthroplasty 09/01/2019 with Dr. Tessie Fass.  Original implant in 2013. Also of note he has a bioprosthetic aortic valve replacement as well. Early July - arthrocentesis with WBC 27,000 84% PMNs. No growth on plate.  Due to concern over infection he was referred to Dr. Tessie Fass for further management. No fevers, chills or malaise.  Operative report reviewed - underwent revision of the tibial component with wound VAC application.  Intraoperative cultures did not yield any growth of an organism. Course was complicated by a hematoma involving suprapatella space.  He was started empirically on  vancomycin and cefepime --> changed to Vancomycin and Ceftriaxone. Developed AKI on Vancomycin requiring hospitalization at [facility] >> switched to Ceftriaxone IV and PO doxy.   HPI Since Last OV 10/17/19: We spoke on the phone at the end of September regarding some new onset dizziness since transitioning to oral Augmentin twice daily after stopping IV ceftriaxone on 31 August.  We have since switched him to cephalexin 500 twice daily.  He states that he has had ongoing dizziness in the mornings.  He tries to eat half of his breakfast take his antibiotics and other medicines and then eat the remaining portion of breakfast.  Mostly he has toast in the morning but occasionally will add eggs.  He has had a 40 pound weight loss since her last office visit.  He states that he has been intentional about losing weight through the use of weight watchers.  He overall feels very good and pleased with his results.  His daughter is a little bit worried about it given how quickly he lost weight.  [name] denies any fevers or chills or night sweats or overall malaise.  His knee swelling has improved significantly and actually the right leg is the thinnest is ever been in his opinion.  His left he continues to have a little more swelling in than the right but it is baseline for what he is used to presurgery.  He has no trouble with range of motion of the knee.  Walking around well with no pain or walking aid.  He does notice some  balance trouble one time to walk up the stairs.   He continues to have some adjustments to some of his heart medications.  He sees Dr. Harrington Challenger again at the end of October or the beginning of November.   Review of Systems  Constitutional: Positive for weight loss (intentional). Negative for chills, diaphoresis, fever and malaise/fatigue.  HENT: Negative for tinnitus.   Eyes: Negative for blurred vision and photophobia.  Respiratory: Negative for cough and sputum production.   Cardiovascular:  Positive for leg swelling. Negative for chest pain.  Gastrointestinal: Negative for diarrhea, nausea and vomiting.  Genitourinary: Negative for dysuria.  Musculoskeletal: Negative for falls and joint pain.  Skin: Negative for rash.  Neurological: Positive for dizziness. Negative for weakness and headaches.    Past Medical History:  Diagnosis Date  . Aortic stenosis 2008  . Arthritis   . Blood transfusion   . Blood transfusion without reported diagnosis   . CAD (coronary artery disease) 2008  . Cataract   . Complication of anesthesia    hallucinated once  . H/O hiatal hernia   . 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 Hx of colonic polyps   . Hyperlipidemia   . Hypertension   . Thyroid disease     Outpatient Medications Prior to Visit  Medication Sig Dispense Refill  . acetaminophen (TYLENOL) 500 MG tablet Take 1,000 mg by mouth as needed for moderate pain.    Marland Kitchen amiodarone (PACERONE) 200 MG tablet TAKE ONE-HALF TABLET BY  MOUTH DAILY 45 tablet 3  . amLODipine-olmesartan (AZOR) 10-40 MG tablet TAKE 1 TABLET BY MOUTH  DAILY 90 tablet 3  . aspirin EC 81 MG tablet Take 1 tablet (81 mg total) by mouth daily. Swallow whole. 90 tablet 3  . atorvastatin (LIPITOR) 80 MG tablet TAKE 1 TABLET BY MOUTH  DAILY AT 6 PM 90 tablet 3  . carvedilol (COREG) 6.25 MG tablet TAKE 1 TABLET BY MOUTH  TWICE DAILY 180 tablet 3  . ezetimibe (ZETIA) 10 MG tablet TAKE 1 TABLET BY MOUTH  DAILY 90 tablet 3  . fluconazole (DIFLUCAN) 150 MG tablet Take 1 tablet (150 mg total) by mouth once a week. 2 tablet 0  . furosemide (LASIX) 40 MG tablet TAKE 1 TABLET BY MOUTH  DAILY TAKE ADDITIONAL  TABLET AS NEEDED FOR  SWELLING AND OR WEIGHT 120 tablet 3  . levothyroxine (SYNTHROID) 50 MCG tablet TAKE 1 TABLET BY MOUTH  DAILY BEFORE BREAKFAST 90 tablet 3  . Multiple Vitamin (MULTIVITAMIN WITH MINERALS) TABS Take 1 tablet by mouth daily.    Marland Kitchen omeprazole (PRILOSEC) 40 MG capsule Take 40 mg by mouth daily.    Marland Kitchen  oxyCODONE (OXY IR/ROXICODONE) 5 MG immediate release tablet Take 5 mg by mouth as needed for pain.    . potassium chloride SA (KLOR-CON) 20 MEQ tablet Take 3 tablets the first day, then take one tablet daily 90 tablet 3  . cephALEXin (KEFLEX) 500 MG capsule Take 1 capsule (500 mg total) by mouth 2 (two) times daily. 60 capsule 0  . doxycycline (VIBRA-TABS) 100 MG tablet Take 1 tablet (100 mg total) by mouth 2 (two) times daily. 60 tablet 2  . ipratropium-albuterol (DUONEB) 0.5-2.5 (3) MG/3ML SOLN Take 3 mLs by nebulization every 4 (four) hours as needed. 120 mL 0   No facility-administered medications prior to visit.     Allergies  Allergen Reactions  . Sulfamethoxazole-Trimethoprim Other (See Comments)    Carlyn Reichert Syndrome  Social History   Tobacco Use  . Smoking status: Former Smoker    Quit date: 05/25/1973    Years since quitting: 46.5  . Smokeless tobacco: Never Used  Vaping Use  . Vaping Use: Never used  Substance Use Topics  . Alcohol use: Not Currently    Comment: rare  . Drug use: No    Objective:   Vitals:   12/05/19 1007  BP: (!) 148/71  Pulse: 71  Temp: (!) 97.5 F (36.4 C)  TempSrc: Oral  Weight: 191 lb (86.6 kg)   Body mass index is 26.64 kg/m.  Physical Exam Vitals reviewed.  Constitutional:      Appearance: Normal appearance. He is not ill-appearing.     Comments: Sitting in chair, appears comfortable and in no distress.  He has lost weight since her last office visit together but overall appears very healthy.  HENT:     Head: Normocephalic.     Mouth/Throat:     Mouth: Mucous membranes are moist.     Pharynx: Oropharynx is clear.  Eyes:     General: No scleral icterus. Pulmonary:     Effort: Pulmonary effort is normal.  Musculoskeletal:        General: Normal range of motion.     Cervical back: Normal range of motion.     Comments: His left knee has improved significantly with regards to swelling and ongoing healing of the incision  sites.  All of the previous eschar along the incision site is gone and it is completely well-healed.  There is no warmth no tenderness.  He is generalized swelling of the entire left limb which is to be expected and chronic for him.  No more dimpling in the anterior knee due to swelling.  He can fully flex beyond 90 degrees and fully extend to 180.  Skin:    General: Skin is warm and dry.     Coloration: Skin is not jaundiced or pale.  Neurological:     Mental Status: He is alert and oriented to person, place, and time.  Psychiatric:        Mood and Affect: Mood normal.        Judgment: Judgment normal.       Lab Results: Lab Results  Component Value Date   WBC 8.3 11/03/2019   HGB 10.3 (L) 11/03/2019   HCT 32.2 (L) 11/03/2019   MCV 99 (H) 11/03/2019   PLT 235 11/03/2019    Lab Results  Component Value Date   CREATININE 1.42 (H) 11/03/2019   BUN 26 11/03/2019   NA 142 11/03/2019   K 4.6 11/03/2019   CL 106 11/03/2019   CO2 22 11/03/2019    Lab Results  Component Value Date   ALT 26 09/13/2019   AST 47 (H) 09/13/2019   ALKPHOS 50 09/13/2019   BILITOT 1.7 (H) 09/13/2019     Assessment & Plan:   Problem List Items Addressed This Visit      Unprioritized   Infection of prosthetic left knee joint [facility])    Mr. Rout is doing very well on twice daily suppressive therapy with doxycycline and cephalexin.  I am not certain that the dizziness is due to the cephalexin although it seems to correlate with the morning dose.  I do think he should increase his morning meal and plan to completely finish it before taking his antibiotics to see if this helps. On clinical exam he has had significant reduction in his swelling and there  is no concern for active or ongoing infection by me today.  We discussed continuing current therapy for another 3 months took it 6 months total.  At that time we will needed to make a decision regarding ongoing treatment versus stopping.  His daughter is very  nervous I think about possibly stopping antibiotics with the potential to have it flare back up.  He may do well discontinuing suppressive doxycycline as I would worry most aggressively about staph including MRSA involving the knee lingering around.  We will discuss more at our follow-up in 3 months.      Relevant Medications   cephALEXin (KEFLEX) 500 MG capsule   Dizziness    Given his significant weight loss backing off of some of his cardiac meds hopefully will help his dizziness.  His blood pressure is in the 749T systolic today.  No tachycardia noted.  He has follow-up with Dr. Harrington Challenger in about 2-3 weeks.  Will have him continue current antibiotics but take with a full meal (more than he is currently taking in the morning) and glass of water to ensure best chance to reduce dizziness if it is related to cephalexin.       Other Visit Diagnoses    Medication monitoring encounter    -  Primary   Relevant Orders   C-reactive protein   Sedimentation rate   CBC     I spent 25 minutes with the patient and his daughter in direct discussion of the above.    Janene Madeira, MSN, NP-C [facility] [email or web address] Pager: [phone] Office: [phone] [facility] Main Line: [phone]   12/05/19  1:57 PM

## 2019-12-06 LAB — CBC
HCT: 37.2 % — ABNORMAL LOW (ref 38.5–50.0)
Hemoglobin: 12.2 g/dL — ABNORMAL LOW (ref 13.2–17.1)
MCH: 31.9 pg (ref 27.0–33.0)
MCHC: 32.8 g/dL (ref 32.0–36.0)
MCV: 97.1 fL (ref 80.0–100.0)
MPV: 11.2 fL (ref 7.5–12.5)
Platelets: 202 10*3/uL (ref 140–400)
RBC: 3.83 10*6/uL — ABNORMAL LOW (ref 4.20–5.80)
RDW: 12.7 % (ref 11.0–15.0)
WBC: 9.7 10*3/uL (ref 3.8–10.8)

## 2019-12-06 LAB — C-REACTIVE PROTEIN: CRP: 1.1 mg/L (ref <8.0)

## 2019-12-06 LAB — SEDIMENTATION RATE: Sed Rate: 28 mm/h — ABNORMAL HIGH (ref 0–20)

## 2019-12-06 MED ORDER — AMLODIPINE-OLMESARTAN 5-20 MG PO TABS: 1.0000 | ORAL_TABLET | Freq: Every day | ORAL | 3 refills | Status: DC

## 2019-12-06 NOTE — Telephone Encounter (Signed)
Patient notified that Dr. Harrington Challenger is in agreement to decrease AZOR to 5-20 mg daily.  He has follow up with Dr. Harrington Challenger on 12/18/19.

## 2019-12-12 ENCOUNTER — Telehealth: Payer: Self-pay | Admitting: *Deleted

## 2019-12-12 NOTE — Telephone Encounter (Signed)
Luis Herrera is calling for [employer].  He plans to fly to [place] next week, will be picking up grandson.  The flight is about 2 hours long.  He would like to know if this is a good idea with regards to his infection.  At this point, he is unsure specifically how/when he returns back home.  He reports no more side effects from medication - feels fine taking doxy. OK to fly? Landis Gandy, RN

## 2019-12-12 NOTE — Telephone Encounter (Signed)
Relayed to patient. Thanks! 

## 2019-12-12 NOTE — Telephone Encounter (Signed)
Yes I think he is OK to fly as long as he continues to take his antibiotics with him.  I would like for him to take the compression stockings with him to make sure his legs don't swell too much, calf press exercises on the plane also would be helpful for him.   And of course all COVID precautions - I would prefer he wear a KN95 if possible/tolerable on the plane instead of a surgical mask. Definitely not a cloth mask. Would not eat or drink on the plane if he can avoid it to prevent mask from coming off.   I am so glad he is doing better without side effects. His knee looked so good recently and his blood work confirms he is doing very well regarding infection treatment.

## 2019-12-14 ENCOUNTER — Other Ambulatory Visit: Payer: Self-pay | Admitting: Internal Medicine

## 2019-12-14 DIAGNOSIS — I10 Essential (primary) hypertension: Secondary | ICD-10-CM

## 2019-12-18 ENCOUNTER — Ambulatory Visit: Payer: Medicare Other | Admitting: Internal Medicine

## 2019-12-18 ENCOUNTER — Other Ambulatory Visit: Payer: Self-pay

## 2019-12-18 ENCOUNTER — Encounter: Payer: Self-pay | Admitting: Internal Medicine

## 2019-12-18 VITALS — BP 142/60 | HR 63 | Ht 71.0 in | Wt 208.4 lb

## 2019-12-18 DIAGNOSIS — I2510 Atherosclerotic heart disease of native coronary artery without angina pectoris: Secondary | ICD-10-CM

## 2019-12-18 DIAGNOSIS — Z952 Presence of prosthetic heart valve: Secondary | ICD-10-CM

## 2019-12-18 LAB — COMPREHENSIVE METABOLIC PANEL
ALT: 12 IU/L (ref 0–44)
AST: 18 IU/L (ref 0–40)
Albumin/Globulin Ratio: 1.4 (ref 1.2–2.2)
Albumin: 3.8 g/dL (ref 3.7–4.7)
Alkaline Phosphatase: 97 IU/L (ref 44–121)
BUN/Creatinine Ratio: 20 (ref 10–24)
BUN: 23 mg/dL (ref 8–27)
Bilirubin Total: 0.5 mg/dL (ref 0.0–1.2)
CO2: 25 mmol/L (ref 20–29)
Calcium: 8.9 mg/dL (ref 8.6–10.2)
Chloride: 107 mmol/L — ABNORMAL HIGH (ref 96–106)
Creatinine, Ser: 1.15 mg/dL (ref 0.76–1.27)
GFR calc Af Amer: 70 mL/min/{1.73_m2} (ref >59)
GFR calc non Af Amer: 61 mL/min/{1.73_m2} (ref >59)
Globulin, Total: 2.7 g/dL (ref 1.5–4.5)
Glucose: 96 mg/dL (ref 65–99)
Potassium: 4.2 mmol/L (ref 3.5–5.2)
Sodium: 142 mmol/L (ref 134–144)
Total Protein: 6.5 g/dL (ref 6.0–8.5)

## 2019-12-18 LAB — LIPID PANEL
Chol/HDL Ratio: 2.8 ratio (ref 0.0–5.0)
Cholesterol, Total: 150 mg/dL (ref 100–199)
HDL: 54 mg/dL (ref >39)
LDL Chol Calc (NIH): 78 mg/dL (ref 0–99)
Triglycerides: 96 mg/dL (ref 0–149)
VLDL Cholesterol Cal: 18 mg/dL (ref 5–40)

## 2019-12-18 NOTE — Patient Instructions (Addendum)
Medication Instructions:  No changes *If you need a refill on your cardiac medications before your next appointment, please call your pharmacy*   Lab Work: Today: lipids, cmet  If you have labs (blood work) drawn today and your tests are completely normal, you will receive your results only by: Marland Kitchen MyChart Message (if you have MyChart) OR . A paper copy in the mail If you have any lab test that is abnormal or we need to change your treatment, we will call you to review the results.   Testing/Procedures: none   Follow-Up: At [facility], you and your health needs are our priority.  As part of our continuing mission to provide you with exceptional heart care, we have created designated Provider Care Teams.  These Care Teams include your primary Cardiologist (physician) and Advanced Practice Providers (APPs -  Physician Assistants and Nurse Practitioners) who all work together to provide you with the care you need, when you need it.   Your next appointment:   6 month(s)   The format for your next appointment:   In Person  Provider:   You may see [name], MD or one of the following Advanced Practice Providers on your designated Care Team:    Richardson Dopp, PA-C  Robbie Lis, [place]    Other Instructions Addendum: limited echo ordered, pt contacted to make aware he will receive a call to schedule.  [place], RN 12/18/19 10:23 am.

## 2019-12-18 NOTE — Progress Notes (Signed)
Cardiology Office Note   Date:  12/18/2019   ID:  Luis Herrera, DOB Feb 24, 1940, MRN [id number]  PCP:  [name], MD  Cardiologist:   [name], MD   F/U of CAD     History of Present Illness: Luis Herrera is a 79 y.o. male with a history of CAD (s/p CABG with SVG to PDA), AV dz (s/p AVR) HTN and PVCs  Pt  is also followed by [name]   Amiodarone is being used to suppress PVC     In July 2021 the patient underwent a procedure for left knee infection in [place] post procedure he had complications with bleeding in his leg and profound anemia.  This was further complicated by severe renal insufficiency.  Patient was hospitalized.  Echocardiogram done showed mean gradient across the aortic valve at increased ((patient anemic at the time    I last saw the pt in clinic in AUg 2021 he was recuperating at the time.  Since then he has been seen in the renal clinic in things are improving.  Patient says his breathing is good.  He denies chest discomfort.  No dizziness.  No palpitations.  He did turn in a monitor but has not heard back from that.  His lower extremity swelling on the left leg is continuing to improve.  His weight is down.      Current Meds  Medication Sig  . acetaminophen (TYLENOL) 500 MG tablet Take 1,000 mg by mouth as needed for moderate pain.  Marland Kitchen amiodarone (PACERONE) 200 MG tablet TAKE ONE-HALF TABLET BY  MOUTH DAILY  . amLODipine-olmesartan (AZOR) 5-20 MG tablet Take 1 tablet by mouth daily.  Marland Kitchen aspirin EC 81 MG tablet Take 1 tablet (81 mg total) by mouth daily. Swallow whole.  Marland Kitchen atorvastatin (LIPITOR) 80 MG tablet TAKE 1 TABLET BY MOUTH  DAILY AT 6 PM  . carvedilol (COREG) 6.25 MG tablet TAKE 1 TABLET BY MOUTH  TWICE DAILY  . cephALEXin (KEFLEX) 500 MG capsule Take 1 capsule (500 mg total) by mouth 2 (two) times daily.  Marland Kitchen doxycycline (VIBRA-TABS) 100 MG tablet Take 1 tablet (100 mg total) by mouth 2 (two) times daily.  Marland Kitchen ezetimibe (ZETIA) 10 MG tablet TAKE 1  TABLET BY MOUTH  DAILY  . furosemide (LASIX) 40 MG tablet TAKE 1 TABLET BY MOUTH  DAILY TAKE ADDITIONAL  TABLET AS NEEDED FOR  SWELLING AND OR WEIGHT  . levothyroxine (SYNTHROID) 50 MCG tablet TAKE 1 TABLET BY MOUTH  DAILY BEFORE BREAKFAST  . Multiple Vitamin (MULTIVITAMIN WITH MINERALS) TABS Take 1 tablet by mouth daily.  . potassium chloride SA (KLOR-CON) 20 MEQ tablet Take 3 tablets the first day, then take one tablet daily     Allergies:   Sulfamethoxazole-trimethoprim   Past Medical History:  Diagnosis Date  . Aortic stenosis 2008  . Arthritis   . Blood transfusion   . Blood transfusion without reported diagnosis   . CAD (coronary artery disease) 2008  . Cataract   . Complication of anesthesia    hallucinated once  . H/O hiatal hernia   . 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 Hx of colonic polyps   . Hyperlipidemia   . Hypertension   . Thyroid disease     Past Surgical History:  Procedure Laterality Date  . AORTIC VALVE REPLACEMENT  2009   Bioprosthetic at [place]  2008  .  CHOLECYSTECTOMY  1982  . COLONOSCOPY    . CORONARY ARTERY BYPASS GRAFT  2009   SVG-PDA w/ AVR  . CORRECTION HAMMER TOE  2011   BILATERAL  . 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 POLYPECTOMY    . TONSILLECTOMY    . TOTAL HIP ARTHROPLASTY Left 2000  . TOTAL KNEE ARTHROPLASTY  2000   left  . TOTAL KNEE ARTHROPLASTY  12/01/2011   Procedure: TOTAL KNEE ARTHROPLASTY;  Surgeon: Mauri Pole, MD;  Location: [facility];  Service: Orthopedics;  Laterality: Right;  . WEIL OSTEOTOMY  05/28/2011     Social History:  The patient  reports that he quit smoking about 46 years ago. He has never used smokeless tobacco. He reports previous alcohol use. He reports that he does not use drugs.   Family History:  The patient's family history includes Colon cancer (age of onset: 71) in his mother; Heart attack in his brother and father;  Hyperlipidemia in an other family member; Hypertension in his brother, sister, and another family member.    ROS:  Please see the history of present illness. All other systems are reviewed and  Negative to the above problem except as noted.    PHYSICAL EXAM: VS:  BP (!) 142/60   Pulse 63   Ht 5' 11"  (1.803 m)   Wt 208 lb 6.4 oz (94.5 kg)   SpO2 97%   BMI 29.07 kg/m   GEN: Obese 79 year old, in no acute distress  HEENT: normal  Neck: no JVD,  Cardiac: RRR; Gr II/VI systolic murmur LSB left leg with 1+ edema into the thigh area Respiratory:  clear to auscultation bilaterally, normal work of breathing GI: soft, nontender, nondistended, + BS  No hepatomegaly  MS: no deformity Moving all extremities   Skin: warm and dry, no rash Neuro:  Strength and sensation are intact Psych: euthymic mood, full affect   EKG:  EKG is not ordered today.  Echo 7.28.21  1. Left ventricular ejection fraction, by estimation, is 60 to 65%. The left ventricle has normal function. There is moderate left ventricular hypertrophy. 2. Right ventricular systolic function is normal. The right ventricular size is normal. There is moderately elevated pulmonary artery systolic pressure. 3. Mild mitral valve regurgitation. 4. Bioprosthetic aortic valve leaflets not well-visualized but appear thickened. The mean gradient has increased from 14.8 mmHg 03/2018 to 32.7 mmHg now. The aortic valve has been repaired/replaced. Aortic valve regurgitation is mild. Moderate aortic valve stenosis. There is a bioprosthetic valve present in the aortic position. Aortic valve area, by VTI measures 1.21 cm. Aortic valve mean gradient measures 32.7 mmHg. Aortic valve Vmax measures 3.96 m/s. 5. Aortic dilatation noted. There is mild dilatation of the ascending aorta. 6. The inferior vena cava is dilated in size with >50% respiratory variability, suggesting right atrial pressure of 8 mmHg. Lipid Panel    Component Value Date/Time     CHOL 144 12/13/2018 0832   TRIG 64 12/13/2018 0832   HDL 58 12/13/2018 0832   CHOLHDL 2.5 12/13/2018 0832   CHOLHDL 3 11/14/2015 0954   VLDL 16.8 11/14/2015 0954   LDLCALC 73 12/13/2018 0832      Wt Readings from Last 3 Encounters:  12/18/19 208 lb 6.4 oz (94.5 kg)  12/05/19 191 lb (86.6 kg)  10/17/19 217 lb (98.4 kg)      ASSESSMENT AND PLAN: 1.  Hx CAD.  Patient asymptomatic.  Continue to follow.  2  AV dz  S/p AVR.   Echo  during admit showed increased gradient through the valve prothseiss  BUt patient was anemic when done.  Today it sounds better.  Review of this I would go ahead and schedule a limited echo to reevaluate the gradients again.  (The mean gradient had been 15 in February 2020; in August 2021 mean gradient was 33.  Patient anemic at time)  3.  Infection.  The patient continues on p.o. antibiotics.  He has follow-up in ID clinic this winter.  4.  Renal.  We will check labs today.  He had been followed by Dr. Moshe Cipro in at [facility] nephrology. 5  Hx PVCs  Keep on amiodarone check electrolytes.  Monitor turned in.  He has a appointment with Crissie Sickles soon.  7  CV dz  Mild plaqing of carotids.  Will check lipids  8.  Lipids.  Keep on current regimen.  Labs today.  Check c-Met and lipid today.  Follow-up in April.   Current medicines are reviewed at length with the patient today.  The patient does not have concerns regarding medicines.  Signed, [name], MD  12/18/2019 9:38 AM    [address], [place], [place]  [address] Phone: [phone]; Fax: [phone]

## 2020-01-08 ENCOUNTER — Telehealth: Payer: Self-pay | Admitting: *Deleted

## 2020-01-08 NOTE — Telephone Encounter (Signed)
Patient returned monitor to [facility] .  [employer] posted No Data meaning patient applied monitor but unfortunately never turned it on.  Enrollment and all charges have been cancelled. Abram Sander will be on site 01/09/20, if Dr. Lovena Le would like a monitor to be applied in office while patient here for OV.  Patient will need to be re-enrolled.

## 2020-01-09 ENCOUNTER — Ambulatory Visit (HOSPITAL_COMMUNITY): Payer: Medicare Other | Attending: Internal Medicine

## 2020-01-09 ENCOUNTER — Telehealth: Payer: Self-pay | Admitting: Radiology

## 2020-01-09 ENCOUNTER — Encounter: Payer: Self-pay | Admitting: Internal Medicine

## 2020-01-09 ENCOUNTER — Other Ambulatory Visit: Payer: Self-pay

## 2020-01-09 ENCOUNTER — Ambulatory Visit: Payer: Medicare Other | Admitting: Internal Medicine

## 2020-01-09 ENCOUNTER — Other Ambulatory Visit (INDEPENDENT_AMBULATORY_CARE_PROVIDER_SITE_OTHER): Payer: Medicare Other

## 2020-01-09 VITALS — BP 146/52 | HR 66 | Ht 71.0 in | Wt 208.0 lb

## 2020-01-09 DIAGNOSIS — I493 Ventricular premature depolarization: Secondary | ICD-10-CM

## 2020-01-09 DIAGNOSIS — Z952 Presence of prosthetic heart valve: Secondary | ICD-10-CM | POA: Insufficient documentation

## 2020-01-09 DIAGNOSIS — I2510 Atherosclerotic heart disease of native coronary artery without angina pectoris: Secondary | ICD-10-CM | POA: Insufficient documentation

## 2020-01-09 LAB — ECHOCARDIOGRAM LIMITED
AR max vel: 1.77 cm2
AV Area VTI: 1.71 cm2
AV Area mean vel: 1.72 cm2
AV Mean grad: 18 mmHg
AV Peak grad: 30.8 mmHg
Ao pk vel: 2.77 m/s
Area-P 1/2: 4.68 cm2
P 1/2 time: 284 msec
S' Lateral: 4 cm

## 2020-01-09 MED ORDER — PERFLUTREN LIPID MICROSPHERE
1.0000 mL | INTRAVENOUS | Status: AC | PRN
  Administered 2020-01-09: 1 mL via INTRAVENOUS

## 2020-01-09 NOTE — Patient Instructions (Addendum)
Medication Instructions:  Your physician recommends that you continue on your current medications as directed. Please refer to the Current Medication list given to you today.  Labwork: None ordered.  Testing/Procedures: Your physician has recommended that you wear a holter monitor. Holter monitors are medical devices that record the heart's electrical activity. Doctors most often use these monitors to diagnose arrhythmias. Arrhythmias are problems with the speed or rhythm of the heartbeat. The monitor is a small, portable device. You can wear one while you do your normal daily activities. This is usually used to diagnose what is causing palpitations/syncope (passing out).  You will wear a ZIO monitor for 3 days.  Follow-Up: Your physician wants you to follow-up in: one year with Dr. Lovena Le.   You will receive a reminder letter in the mail two months in advance. If you don't receive a letter, please call our office to schedule the follow-up appointment.  Any Other Special Instructions Will Be Listed Below (If Applicable).  If you need a refill on your cardiac medications before your next appointment, please call your pharmacy.

## 2020-01-09 NOTE — Telephone Encounter (Signed)
3 day Zio XT Monitor applied in office today after 1st monitor was returned to [place] with no data.

## 2020-01-09 NOTE — Progress Notes (Signed)
HPI Mr. Anstead returns today for followup of his PVC's. He is a pleasant 79 yo man with a h/o CAD, s/p CABG, AS, s/p AVR, HTN who I initially saw for PVC's a couple of years ago. We placed him on low dose amiodarone and his dose has been gradually decreased. 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 The patient developed a knee infection and had a revision down in [place] a couple of months ago with the procedure complicated by anemia due to a very large hematoma, acute on chronic renal failure, and CHF. He has improved. He denies chest pain or sob. His knee is still swollen but there is no additional pain. He denies fever and chills. Allergies  Allergen Reactions  . Sulfamethoxazole-Trimethoprim Other (See Comments)    Carlyn Reichert Syndrome     Current Outpatient Medications  Medication Sig Dispense Refill  . acetaminophen (TYLENOL) 500 MG tablet Take 1,000 mg by mouth as needed for moderate pain.    Marland Kitchen amiodarone (PACERONE) 200 MG tablet TAKE ONE-HALF TABLET BY  MOUTH DAILY 45 tablet 3  . amLODipine-olmesartan (AZOR) 5-20 MG tablet Take 1 tablet by mouth daily. 90 tablet 3  . aspirin EC 81 MG tablet Take 1 tablet (81 mg total) by mouth daily. Swallow whole. 90 tablet 3  . atorvastatin (LIPITOR) 80 MG tablet TAKE 1 TABLET BY MOUTH  DAILY AT 6 PM 90 tablet 3  . carvedilol (COREG) 6.25 MG tablet TAKE 1 TABLET BY MOUTH  TWICE DAILY 180 tablet 3  . cephALEXin (KEFLEX) 500 MG capsule Take 1 capsule (500 mg total) by mouth 2 (two) times daily. 60 capsule 2  . doxycycline (VIBRA-TABS) 100 MG tablet Take 1 tablet (100 mg total) by mouth 2 (two) times daily. 60 tablet 2  . ezetimibe (ZETIA) 10 MG tablet TAKE 1 TABLET BY MOUTH  DAILY 90 tablet 3  . furosemide (LASIX) 40 MG tablet TAKE 1 TABLET BY MOUTH  DAILY TAKE ADDITIONAL  TABLET AS NEEDED FOR  SWELLING AND OR WEIGHT 120 tablet 3  . levothyroxine (SYNTHROID) 50 MCG tablet TAKE 1 TABLET BY MOUTH  DAILY BEFORE BREAKFAST  90 tablet 3  . Multiple Vitamin (MULTIVITAMIN WITH MINERALS) TABS Take 1 tablet by mouth daily.    . potassium chloride SA (KLOR-CON) 20 MEQ tablet Take 3 tablets the first day, then take one tablet daily 90 tablet 3   No current facility-administered medications for this visit.   Facility-Administered Medications Ordered in Other Visits  Medication Dose Route Frequency Provider Last Rate Last Admin  . perflutren lipid microspheres (DEFINITY) IV suspension  1-10 mL Intravenous PRN Fay Records, MD   1 mL at 01/09/20 1130     Past Medical History:  Diagnosis Date  . Aortic stenosis 2008  . Arthritis   . Blood transfusion   . Blood transfusion without reported diagnosis   . CAD (coronary artery disease) 2008  . Cataract   . Complication of anesthesia    hallucinated once  . H/O hiatal hernia   . 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 Hx of colonic polyps   . Hyperlipidemia   . Hypertension   . Thyroid disease     ROS:   All systems reviewed and negative except as noted in the HPI.   Past Surgical History:  Procedure Laterality Date  . AORTIC VALVE REPLACEMENT  2009   Bioprosthetic at [facility]  2008  . CHOLECYSTECTOMY  1982  . COLONOSCOPY    . CORONARY ARTERY BYPASS GRAFT  2009   SVG-PDA w/ AVR  . CORRECTION HAMMER TOE  2011   BILATERAL  . 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 POLYPECTOMY    . TONSILLECTOMY    . TOTAL HIP ARTHROPLASTY Left 2000  . TOTAL KNEE ARTHROPLASTY  2000   left  . TOTAL KNEE ARTHROPLASTY  12/01/2011   Procedure: TOTAL KNEE ARTHROPLASTY;  Surgeon: Mauri Pole, MD;  Location: [facility];  Service: Orthopedics;  Laterality: Right;  . WEIL OSTEOTOMY  05/28/2011     Family History  Problem Relation Age of Onset  . Colon cancer Mother 27  . Heart attack Father   . Hypertension Other   . Hyperlipidemia Other   . Heart attack Brother   . Hypertension Sister   .  Hypertension Brother   . Stroke Neg Hx   . Esophageal cancer Neg Hx   . Rectal cancer Neg Hx   . Stomach cancer Neg Hx      Social History   Socioeconomic History  . Marital status: Married    Spouse name: Not on file  . Number of children: Not on file  . Years of education: Not on file  . Highest education level: Not on file  Occupational History  . Occupation: retired  Tobacco Use  . Smoking status: Former Smoker    Quit date: 05/25/1973    Years since quitting: 46.6  . Smokeless tobacco: Never Used  Vaping Use  . Vaping Use: Never used  Substance and Sexual Activity  . Alcohol use: Not Currently    Comment: rare  . Drug use: No  . Sexual activity: Not Currently  Other Topics Concern  . Not on file  Social History Narrative   Regular exercise-yes. Pt lives in [place] with his wife.   Social Determinants of Health   Financial Resource Strain:   . Difficulty of Paying Living Expenses: Not on file  Food Insecurity:   . Worried About Charity fundraiser in the Last Year: Not on file  . Ran Out of Food in the Last Year: Not on file  Transportation Needs:   . Lack of Transportation (Medical): Not on file  . Lack of Transportation (Non-Medical): Not on file  Physical Activity:   . Days of Exercise per Week: Not on file  . Minutes of Exercise per Session: Not on file  Stress:   . Feeling of Stress : Not on file  Social Connections:   . Frequency of Communication with Friends and Family: Not on file  . Frequency of Social Gatherings with Friends and Family: Not on file  . Attends Religious Services: Not on file  . Active Member of Clubs or Organizations: Not on file  . Attends Archivist Meetings: Not on file  . Marital Status: Not on file  Intimate Partner Violence:   . Fear of Current or Ex-Partner: Not on file  . Emotionally Abused: Not on file  . Physically Abused: Not on file  . Sexually Abused: Not on file     BP (!) 146/52    Pulse 66   Ht 5\' 11"  (1.803 m)   Wt 208 lb (94.3 kg)   SpO2 (!) 89%   BMI 29.01 kg/m   Physical Exam:  Well appearing NAD HEENT: Unremarkable Neck:  No JVD, no thyromegally Lymphatics:  No  adenopathy Back:  No CVA tenderness Lungs:  Clear with no wheezes HEART:  Regular rate rhythm, no murmurs, no rubs, no clicks Abd:  soft, positive bowel sounds, no organomegally, no rebound, no guarding Ext:  2 plus pulses, no edema, no cyanosis, no clubbing Skin:  No rashes no nodules Neuro:  CN II through XII intact, motor grossly intact  EKG - nsr with RBBB   Assess/Plan: 1. PVC's - he was supposed to have worn a cardiac monitor last month but did not. We will re-order.  2. CAD - he denies anginal symptoms. No change in meds. 3. Obesity - he has lost over 30 lbs since his infection. 4. Knee infection - he denies any residual evidence of systemic infection. No fever or night sweats. The pain in his knee has resolved.  Carleene Overlie Derric Dealmeida,MD

## 2020-01-23 DIAGNOSIS — I493 Ventricular premature depolarization: Secondary | ICD-10-CM | POA: Diagnosis not present

## 2020-01-24 ENCOUNTER — Telehealth: Payer: Self-pay | Admitting: Internal Medicine

## 2020-01-24 NOTE — Telephone Encounter (Signed)
Patient is following up to discuss heart monitor results.

## 2020-01-24 NOTE — Telephone Encounter (Signed)
HM results reviewed by Dr. Lovena Le.  Pt with PVC burden of 12%.  Per Dr. Estanislado Pandy Pt asymptomatic no action needed.   If Pt symptomatic-may increase amiodarone.  Per Pt he is asymptomatic with his PVC's (does not feel them).  Advised per Dr. Lovena Le no action needed.  Pt thanked nurse for call back.

## 2020-01-24 NOTE — Telephone Encounter (Signed)
Patient calling for heart monitor results.  

## 2020-03-05 ENCOUNTER — Telehealth (INDEPENDENT_AMBULATORY_CARE_PROVIDER_SITE_OTHER): Payer: Medicare Other | Admitting: Infectious Diseases

## 2020-03-05 ENCOUNTER — Other Ambulatory Visit: Payer: Self-pay

## 2020-03-05 ENCOUNTER — Encounter: Payer: Self-pay | Admitting: Infectious Diseases

## 2020-03-05 DIAGNOSIS — T8454XA Infection and inflammatory reaction due to internal left knee prosthesis, initial encounter: Secondary | ICD-10-CM | POA: Diagnosis not present

## 2020-03-05 NOTE — Assessment & Plan Note (Signed)
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  He has been continued on doxycycline alone since the last office visit in October due to side effects from the cephalexin and other gram-negative coverage.  He is done very well and reports no signs of local infection in the knee.  He does still have some intermittent swelling in the left leg greater than right but he has no trouble with redness, pain, altered range of motion.  His inflammatory markers normalized on treatment.  We discussed options going forward and given he has unpleasant side effects from the antibiotics he would like to elect for close monitoring off antibiotics at this point.  Given that his initial operative cultures did not yield any growth, nor the arthrocentesis from an outpatient aspiration, I am hopeful that he had a lower virulent organism and his infection has been cured with a DAIR approach.  We discussed symptoms that would be concerning for relapsing infection and have a plan to reinitiate doxycycline twice a day should any of these concerns be noted.  Of great concern he does have a prosthetic heart valves and pacemaker that we need to protect and be vigilant about preventing infection and.  He will come to the office for an in person evaluation in 3 weeks for repeat assessment including lab work.  In the interim he will update me if there is any signs of local infection relapse.

## 2020-03-05 NOTE — Progress Notes (Signed)
Patient: Luis Herrera  DOB: 12/16/1940 MRN: [id number] PCP: [name], MD   VIRTUAL CARE ENCOUNTER  I connected with [name] on 03/05/20 at 11:30 AM EST by TELEPHONE and verified that I am speaking with the correct person using two identifiers.   I discussed the limitations, risks, security and privacy concerns of performing an evaluation and management service by telephone and the availability of in person appointments. I also discussed with the patient that there may be a patient responsible charge related to this service. The patient expressed understanding and agreed to proceed.  Patient Location: [place] residence   Other Participants: daughter   Provider Location: Provider Home, [place]    Subjective:   Chief Complaint  Patient presents with  . Follow-up    Knee infection follow up.       Jacion Dismore is a 80 y.o. male on treatment for left prosthetic knee infection.   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 Tessie Fass.  Original implant in 2013. Also of note he has a bioprosthetic aortic valve replacement as well. Early July - arthrocentesis with WBC 27,000 84% PMNs. No growth on plate.  Due to concern over infection he was referred to Dr. Tessie Fass for further management. No fevers, chills or malaise.  Operative report reviewed - underwent revision of the tibial component with wound VAC application. Intraoperative cultures did not yield any growth of an organism. Course was complicated by a hematoma involving suprapatella space.  He was started empirically on vancomycin and cefepime --> changed to Vancomycin and Ceftriaxone. Developed AKI on Vancomycin requiring hospitalization at [facility] >> switched to Ceftriaxone IV and PO doxy. Has continued on PO doxycycline for ongoing treatment to complete 6 months.     HPI Since Last OV 11/2019: He is back to 100% normal with regards to the knee. Mobility is completely  baseline, he has zero pain, and report that the swelling has improved very much. Every once in a while he does have some itching around the knee but not something he notices daily. The incision has completely healed and all scabs have fallen off.  He has not had any fevers or chills.  He is having side effects from the doxycycline and would like to consider stopping it if that would be safe to do. Has tried to stay home with all the COVID out in the community now.   His wife says the swelling is not back completely to normal from prior to surgery but agrees that he is moving around very well.    Review of Systems  Constitutional: Negative for chills and fever.  HENT: Negative for tinnitus.   Eyes: Negative for blurred vision and photophobia.  Respiratory: Negative for cough and sputum production.   Cardiovascular: Negative for chest pain.  Gastrointestinal: Negative for diarrhea, nausea and vomiting.  Genitourinary: Negative for dysuria.  Musculoskeletal: Negative for joint pain.  Skin: Negative for rash.  Neurological: Negative for headaches.    Past Medical History:  Diagnosis Date  . Aortic stenosis 2008  . Arthritis   . Blood transfusion   . Blood transfusion without reported diagnosis   . CAD (coronary artery disease) 2008  . Cataract   . Complication of anesthesia    hallucinated once  . H/O hiatal hernia   . 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 Hx of colonic polyps   . Hyperlipidemia   .  Hypertension   . Thyroid disease     Outpatient Medications Prior to Visit  Medication Sig Dispense Refill  . acetaminophen (TYLENOL) 500 MG tablet Take 1,000 mg by mouth as needed for moderate pain.    Marland Kitchen amiodarone (PACERONE) 200 MG tablet TAKE ONE-HALF TABLET BY  MOUTH DAILY 45 tablet 3  . amLODipine-olmesartan (AZOR) 5-20 MG tablet Take 1 tablet by mouth daily. 90 tablet 3  . aspirin EC 81 MG tablet Take 1 tablet (81 mg total) by mouth daily. Swallow whole. 90 tablet 3   . atorvastatin (LIPITOR) 80 MG tablet TAKE 1 TABLET BY MOUTH  DAILY AT 6 PM 90 tablet 3  . carvedilol (COREG) 6.25 MG tablet TAKE 1 TABLET BY MOUTH  TWICE DAILY 180 tablet 3  . ezetimibe (ZETIA) 10 MG tablet TAKE 1 TABLET BY MOUTH  DAILY 90 tablet 3  . furosemide (LASIX) 40 MG tablet TAKE 1 TABLET BY MOUTH  DAILY TAKE ADDITIONAL  TABLET AS NEEDED FOR  SWELLING AND OR WEIGHT 120 tablet 3  . levothyroxine (SYNTHROID) 50 MCG tablet TAKE 1 TABLET BY MOUTH  DAILY BEFORE BREAKFAST 90 tablet 3  . Multiple Vitamin (MULTIVITAMIN WITH MINERALS) TABS Take 1 tablet by mouth daily.    . potassium chloride SA (KLOR-CON) 20 MEQ tablet Take 3 tablets the first day, then take one tablet daily 90 tablet 3  . doxycycline (VIBRA-TABS) 100 MG tablet Take 1 tablet (100 mg total) by mouth 2 (two) times daily. 60 tablet 2  . cephALEXin (KEFLEX) 500 MG capsule Take 1 capsule (500 mg total) by mouth 2 (two) times daily. (Patient not taking: Reported on 03/05/2020) 60 capsule 2   No facility-administered medications prior to visit.     Allergies  Allergen Reactions  . Sulfamethoxazole-Trimethoprim Other (See Comments)    Carlyn Reichert Syndrome    Social History   Tobacco Use  . Smoking status: Former Smoker    Quit date: 05/25/1973    Years since quitting: 46.8  . Smokeless tobacco: Never Used  Vaping Use  . Vaping Use: Never used  Substance Use Topics  . Alcohol use: Not Currently    Comment: rare  . Drug use: No    Objective:   There were no vitals filed for this visit. There is no height or weight on file to calculate BMI.  Physical Exam Vitals reviewed.  Pulmonary:     Effort: Pulmonary effort is normal.     Comments: No shortness of breath detected in conversation.  Neurological:     Mental Status: He is oriented to person, place, and time.  Psychiatric:        Mood and Affect: Mood normal.        Behavior: Behavior normal.        Thought Content: Thought content normal.        Judgment:  Judgment normal.      Lab Results: Lab Results  Component Value Date   WBC 9.7 12/05/2019   HGB 12.2 (L) 12/05/2019   HCT 37.2 (L) 12/05/2019   MCV 97.1 12/05/2019   PLT 202 12/05/2019    Lab Results  Component Value Date   CREATININE 1.15 12/18/2019   BUN 23 12/18/2019   NA 142 12/18/2019   K 4.2 12/18/2019   CL 107 (H) 12/18/2019   CO2 25 12/18/2019    Lab Results  Component Value Date   ALT 12 12/18/2019   AST 18 12/18/2019   ALKPHOS 97 12/18/2019   BILITOT  0.5 12/18/2019     Assessment & Plan:   Patient Active Problem List   Diagnosis Date Noted  . Dizziness 12/05/2019  . Infection of prosthetic left knee joint ([place]) 10/17/2019  . Hypokalemia 10/17/2019  . Lower extremity edema 10/17/2019  . Intertriginous candidiasis 10/17/2019  . Pressure injury of skin 09/13/2019  . Symptomatic anemia 09/12/2019  . Anemia 09/12/2019  . Chest pain 04/13/2018  . Hypothyroidism 04/13/2018  . Aortic valve stenosis 09/27/2017  . Right bundle branch block 09/27/2017  . CAD (coronary artery disease) 09/27/2017  . 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 Acquired claw toe of right foot 06/04/2016  . Class 1 obesity with body mass index (BMI) of 31.0 to 31.9 in adult 12/02/2011  . 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 Back pain of thoracolumbar region 09/01/2010  . Hyperlipidemia with target LDL less than 130 04/10/2010  . Essential hypertension 04/10/2010  . Aortic valve replaced 04/10/2010    Problem List Items Addressed This Visit      Unprioritized   Infection of prosthetic left knee joint [facility])    Mr. 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  He has been continued on doxycycline alone since the last office visit in October due to side effects from the cephalexin and other gram-negative coverage.  He is done very well and reports no signs of local infection in the knee.  He does still have some intermittent  swelling in the left leg greater than right but he has no trouble with redness, pain, altered range of motion.  His inflammatory markers normalized on treatment.  We discussed options going forward and given he has unpleasant side effects from the antibiotics he would like to elect for close monitoring off antibiotics at this point.  Given that his initial operative cultures did not yield any growth, nor the arthrocentesis from an outpatient aspiration, I am hopeful that he had a lower virulent organism and his infection has been cured with a DAIR approach.  We discussed symptoms that would be concerning for relapsing infection and have a plan to reinitiate doxycycline twice a day should any of these concerns be noted.  Of great concern he does have a prosthetic heart valves and pacemaker that we need to protect and be vigilant about preventing infection and.  He will come to the office for an in person evaluation in 3 weeks for repeat assessment including lab work.  In the interim he will update me if there is any signs of local infection relapse.        Follow Up Instructions: As above    I discussed the assessment and treatment plan with the patient. The patient was provided an opportunity to ask questions and all were answered. The patient agreed with the plan and demonstrated an understanding of the instructions.   The patient was advised to call back or seek an in-person evaluation if the symptoms worsen or if the condition fails to improve as anticipated.  I provided 13 minutes of non-face-to-face time during this encounter.   Janene Madeira, MSN, NP-C [facility] for Infectious Disease [email or web address] Pager: [phone] Office: [place]: [phone]   03/05/20  12:26 PM

## 2020-03-05 NOTE — Patient Instructions (Signed)
Nice talking with you and your wife on the phone today, [name].  As we discussed our plan is going to be to stop your antibiotics today and follow closely over the next several weeks and months.  Things to notify me right away of if you notice:  1. Redness of the knee 2. Swelling of the knee 3. Decreased range of motion, stiffness, pain in the knee 4. Fevers that are otherwise unexplained  *If you notice any of the above please resume your doxycycline twice a day and give our office a call or send me a MyChart message   Your next appointment has been set for February 9.  We will plan on doing blood work at this visit as well to help monitor your knee.

## 2020-03-22 ENCOUNTER — Telehealth: Payer: Self-pay | Admitting: Family Medicine

## 2020-03-22 NOTE — Telephone Encounter (Signed)
Left message for patient to call back and schedule Medicare Annual Wellness Visit (AWV) either virtually or in office.   Last AWV no information  please schedule at anytime with [facility] Nurse Health Advisor 1 or 2   This should be a 45 minute visit. Patient also needs appointment with pcp last appointment 04/19/2018

## 2020-03-26 DIAGNOSIS — L539 Erythematous condition, unspecified: Secondary | ICD-10-CM | POA: Diagnosis not present

## 2020-03-26 DIAGNOSIS — S61219A Laceration without foreign body of unspecified finger without damage to nail, initial encounter: Secondary | ICD-10-CM | POA: Diagnosis not present

## 2020-03-26 DIAGNOSIS — Z23 Encounter for immunization: Secondary | ICD-10-CM | POA: Diagnosis not present

## 2020-03-27 ENCOUNTER — Encounter: Payer: Self-pay | Admitting: Infectious Diseases

## 2020-03-27 ENCOUNTER — Other Ambulatory Visit: Payer: Self-pay

## 2020-03-27 ENCOUNTER — Ambulatory Visit (INDEPENDENT_AMBULATORY_CARE_PROVIDER_SITE_OTHER): Payer: Medicare Other | Admitting: Infectious Diseases

## 2020-03-27 VITALS — BP 144/67 | HR 71 | Temp 97.8°F | Wt 215.0 lb

## 2020-03-27 DIAGNOSIS — T8454XD Infection and inflammatory reaction due to internal left knee prosthesis, subsequent encounter: Secondary | ICD-10-CM

## 2020-03-27 DIAGNOSIS — D649 Anemia, unspecified: Secondary | ICD-10-CM

## 2020-03-27 DIAGNOSIS — R600 Localized edema: Secondary | ICD-10-CM | POA: Diagnosis not present

## 2020-03-27 DIAGNOSIS — T8454XA Infection and inflammatory reaction due to internal left knee prosthesis, initial encounter: Secondary | ICD-10-CM

## 2020-03-27 DIAGNOSIS — Z5181 Encounter for therapeutic drug level monitoring: Secondary | ICD-10-CM | POA: Diagnosis not present

## 2020-03-27 NOTE — Progress Notes (Signed)
Patient: Luis Herrera  DOB: Sep 30, 1940 MRN: [id number] PCP: [name], MD     Subjective:   Chief Complaint  Patient presents with  . 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 male on treatment for left prosthetic knee infection.   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 Tessie Fass.  Original implant in 2013. Also of note he has a bioprosthetic aortic valve replacement as well. Early July - arthrocentesis with WBC 27,000 84% PMNs. No growth on plate.  Due to concern over infection he was referred to Dr. Tessie Fass for further management. No fevers, chills or malaise.  Operative report reviewed - underwent revision of the tibial component with wound VAC application. Intraoperative cultures did not yield any growth of an organism. Course was complicated by a hematoma involving suprapatella space.  He was started empirically on vancomycin and cefepime --> changed to Vancomycin and Ceftriaxone. Developed AKI on Vancomycin requiring hospitalization at [facility] >> switched to Ceftriaxone IV and PO doxy. Has continued on PO doxycycline for ongoing treatment to complete 6 months.     HPI Since Last OV 02-2019:   No negative change since stopping abx 3 weeks ago. Full ROM of knee, no erythema or warmth. Doing well aside from slicing his finger open recently and banging leg on a table - has a lump that is non-painful.  Denies any fevers, chills. Feels much better since stopping antibiotics.     Review of Systems  Constitutional: Negative for chills and fever.  HENT: Negative for tinnitus.   Eyes: Negative for blurred vision and photophobia.  Respiratory: Negative for cough and sputum production.   Cardiovascular: Negative for chest pain.  Gastrointestinal: Negative for diarrhea, nausea and vomiting.  Genitourinary: Negative for dysuria.   Musculoskeletal: Negative for joint pain.  Skin: Negative for rash.  Neurological: Negative for headaches.    Past Medical History:  Diagnosis Date  . Aortic stenosis 2008  . Arthritis   . Blood transfusion   . Blood transfusion without reported diagnosis   . CAD (coronary artery disease) 2008  . Cataract   . Complication of anesthesia    hallucinated once  . H/O hiatal hernia   . 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 Hx of colonic polyps   . Hyperlipidemia   . Hypertension   . Thyroid disease     Outpatient Medications Prior to Visit  Medication Sig Dispense Refill  . acetaminophen (TYLENOL) 500 MG tablet Take 1,000 mg by mouth as needed for moderate pain.    Marland Kitchen amiodarone (PACERONE) 200 MG tablet TAKE ONE-HALF TABLET BY  MOUTH DAILY 45 tablet 3  . amLODipine-olmesartan (AZOR) 5-20 MG tablet Take 1 tablet by mouth daily. 90 tablet 3  . aspirin EC 81 MG tablet Take 1 tablet (81 mg total) by mouth daily. Swallow whole. 90 tablet 3  . atorvastatin (LIPITOR) 80 MG tablet TAKE 1 TABLET BY MOUTH  DAILY AT 6 PM 90 tablet 3  . carvedilol (COREG) 6.25 MG tablet TAKE 1 TABLET BY MOUTH  TWICE DAILY 180 tablet 3  . ezetimibe (ZETIA) 10 MG tablet TAKE 1 TABLET BY MOUTH  DAILY 90 tablet 3  . furosemide (LASIX) 40 MG tablet TAKE 1 TABLET BY MOUTH  DAILY TAKE ADDITIONAL  TABLET AS NEEDED FOR  SWELLING  AND OR WEIGHT 120 tablet 3  . levothyroxine (SYNTHROID) 50 MCG tablet TAKE 1 TABLET BY MOUTH  DAILY BEFORE BREAKFAST 90 tablet 3  . Multiple Vitamin (MULTIVITAMIN WITH MINERALS) TABS Take 1 tablet by mouth daily.    . potassium chloride SA (KLOR-CON) 20 MEQ tablet Take 3 tablets the first day, then take one tablet daily 90 tablet 3   No facility-administered medications prior to visit.     Allergies  Allergen Reactions  . Sulfamethoxazole-Trimethoprim Other (See Comments)    Carlyn Reichert Syndrome    Social History   Tobacco Use  . Smoking status: Former Smoker    Quit  date: 05/25/1973    Years since quitting: 46.8  . Smokeless tobacco: Never Used  Vaping Use  . Vaping Use: Never used  Substance Use Topics  . Alcohol use: Not Currently    Comment: rare  . Drug use: No    Objective:   Vitals:   03/27/20 1536  BP: (!) 144/67  Pulse: 71  Temp: 97.8 F (36.6 C)  TempSrc: Oral  Weight: 215 lb (97.5 kg)   Body mass index is 29.99 kg/m.  Physical Exam Constitutional:      Appearance: Normal appearance. He is not ill-appearing.  HENT:     Head: Normocephalic.     Mouth/Throat:     Mouth: Mucous membranes are moist.     Pharynx: Oropharynx is clear.  Eyes:     General: No scleral icterus. Pulmonary:     Effort: Pulmonary effort is normal.  Musculoskeletal:        General: Normal range of motion.     Cervical back: Normal range of motion.     Comments: L knee with full active ROM. No restrictions or stiffness. Well healed incision overlying the knee. No warmth or erythema. Slightly increased swelling compared to right knee but baseline for him.   Skin:    General: Skin is warm and dry.     Capillary Refill: Capillary refill takes less than 2 seconds.     Coloration: Skin is not jaundiced or pale.     Comments: Dry skin on LLE  Neurological:     Mental Status: He is alert and oriented to person, place, and time.  Psychiatric:        Mood and Affect: Mood normal.        Judgment: Judgment normal.      Lab Results: Lab Results  Component Value Date   WBC 9.7 12/05/2019   HGB 12.2 (L) 12/05/2019   HCT 37.2 (L) 12/05/2019   MCV 97.1 12/05/2019   PLT 202 12/05/2019    Lab Results  Component Value Date   CREATININE 1.15 12/18/2019   BUN 23 12/18/2019   NA 142 12/18/2019   K 4.2 12/18/2019   CL 107 (H) 12/18/2019   CO2 25 12/18/2019    Lab Results  Component Value Date   ALT 12 12/18/2019   AST 18 12/18/2019   ALKPHOS 97 12/18/2019   BILITOT 0.5 12/18/2019     Assessment & Plan:   Patient Active Problem List    Diagnosis Date Noted  . Dizziness 12/05/2019  . Infection of prosthetic left knee joint ([place]) 10/17/2019  . Hypokalemia 10/17/2019  . Lower extremity edema 10/17/2019  . Pressure injury of skin 09/13/2019  . Symptomatic anemia 09/12/2019  . Anemia 09/12/2019  . Chest pain 04/13/2018  . Hypothyroidism 04/13/2018  . Aortic valve stenosis 09/27/2017  . Right bundle branch block 09/27/2017  .  CAD (coronary artery disease) 09/27/2017  . 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 Acquired claw toe of right foot 06/04/2016  . Class 1 obesity with body mass index (BMI) of 31.0 to 31.9 in adult 12/02/2011  . 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 Back pain of thoracolumbar region 09/01/2010  . Hyperlipidemia with target LDL less than 130 04/10/2010  . Essential hypertension 04/10/2010  . Aortic valve replaced 04/10/2010    Problem List Items Addressed This Visit      Unprioritized   Symptomatic anemia    Follow on repeat blood count for him.       Lower extremity edema    Encouraged liberal moisturizer for LLE to prevent cellulitis. Compression stockings to continue.       Infection of prosthetic left knee joint ([place])    So far good early check up off antibiotics. No changes clinically / locally to the knee. Will follow up with repeat inflammatory markers and CBC today. RTC in 59m. Precautions discussed to notify clinic that would warrant resuming antibiotics.        Other Visit Diagnoses    Medication monitoring encounter    -  Primary   Relevant Orders   C-reactive protein   Sedimentation rate   CBC      Janene Madeira, MSN, NP-C [facility] [email or web address] Pager: [phone] Office: [phone] [place]: [phone]   03/27/20  3:53 PM

## 2020-03-27 NOTE — Assessment & Plan Note (Signed)
So far good early check up off antibiotics. No changes clinically / locally to the knee. Will follow up with repeat inflammatory markers and CBC today. RTC in 44m. Precautions discussed to notify clinic that would warrant resuming antibiotics.

## 2020-03-27 NOTE — Patient Instructions (Signed)
So far so good!  Please stop by the lab on your way out.   Continue to stay off antibiotics.   Follow for changes and let me know right away.   Otherwise will see you back in 2 months in the office to check your knee again.

## 2020-03-27 NOTE — Assessment & Plan Note (Signed)
Encouraged liberal moisturizer for LLE to prevent cellulitis. Compression stockings to continue.

## 2020-03-27 NOTE — Assessment & Plan Note (Signed)
Follow on repeat blood count for him.

## 2020-03-28 LAB — CBC
HCT: 33.1 % — ABNORMAL LOW (ref 38.5–50.0)
Hemoglobin: 11 g/dL — ABNORMAL LOW (ref 13.2–17.1)
MCH: 32.1 pg (ref 27.0–33.0)
MCHC: 33.2 g/dL (ref 32.0–36.0)
MCV: 96.5 fL (ref 80.0–100.0)
MPV: 11.5 fL (ref 7.5–12.5)
Platelets: 185 10*3/uL (ref 140–400)
RBC: 3.43 10*6/uL — ABNORMAL LOW (ref 4.20–5.80)
RDW: 12.7 % (ref 11.0–15.0)
WBC: 8.3 10*3/uL (ref 3.8–10.8)

## 2020-03-28 LAB — SEDIMENTATION RATE: Sed Rate: 14 mm/h (ref 0–20)

## 2020-03-28 LAB — C-REACTIVE PROTEIN: CRP: 0.7 mg/L (ref <8.0)

## 2020-04-05 DIAGNOSIS — S61219S Laceration without foreign body of unspecified finger without damage to nail, sequela: Secondary | ICD-10-CM | POA: Diagnosis not present

## 2020-04-23 ENCOUNTER — Other Ambulatory Visit: Payer: Self-pay | Admitting: Internal Medicine

## 2020-04-25 ENCOUNTER — Other Ambulatory Visit: Payer: Self-pay | Admitting: Internal Medicine

## 2020-04-25 ENCOUNTER — Telehealth: Payer: Self-pay | Admitting: Internal Medicine

## 2020-04-25 NOTE — Telephone Encounter (Signed)
Patient notified that lasix was refilled this am to [facility]. No change in instructions.  He will drop off his renewal for the handicap placard for Dr. Harrington Challenger to sign.  He will pick the form up when its ready.

## 2020-04-25 NOTE — Telephone Encounter (Signed)
Patient wants to talk with Dr. Harrington Challenger or nurse regarding the medication furosemide (LASIX) 40 MG tablet. Patient says that the pharmacy said that it was cancelled and wants to know why Dr. Harrington Challenger cancelled water pills. Patient also said that handicap sticker has expired and needs a form filled out. Please call back

## 2020-05-13 DIAGNOSIS — D2371 Other benign neoplasm of skin of right lower limb, including hip: Secondary | ICD-10-CM | POA: Diagnosis not present

## 2020-05-13 DIAGNOSIS — D2372 Other benign neoplasm of skin of left lower limb, including hip: Secondary | ICD-10-CM | POA: Diagnosis not present

## 2020-05-13 DIAGNOSIS — M792 Neuralgia and neuritis, unspecified: Secondary | ICD-10-CM | POA: Diagnosis not present

## 2020-05-21 DIAGNOSIS — L110 Acquired keratosis follicularis: Secondary | ICD-10-CM | POA: Diagnosis not present

## 2020-05-21 DIAGNOSIS — L570 Actinic keratosis: Secondary | ICD-10-CM | POA: Diagnosis not present

## 2020-05-21 DIAGNOSIS — L820 Inflamed seborrheic keratosis: Secondary | ICD-10-CM | POA: Diagnosis not present

## 2020-05-21 DIAGNOSIS — D485 Neoplasm of uncertain behavior of skin: Secondary | ICD-10-CM | POA: Diagnosis not present

## 2020-05-27 ENCOUNTER — Encounter: Payer: Self-pay | Admitting: Infectious Diseases

## 2020-05-27 ENCOUNTER — Ambulatory Visit (INDEPENDENT_AMBULATORY_CARE_PROVIDER_SITE_OTHER): Payer: Medicare Other | Admitting: Infectious Diseases

## 2020-05-27 ENCOUNTER — Other Ambulatory Visit: Payer: Self-pay

## 2020-05-27 DIAGNOSIS — Z8739 Personal history of other diseases of the musculoskeletal system and connective tissue: Secondary | ICD-10-CM

## 2020-05-27 NOTE — Patient Instructions (Signed)
I am very hopeful we have achieved a cure of your infection.   Please return for follow up here if you every have any concern over your knee and notice increased pain, stiffness, unexplained swelling or redness.   Will think of you when I finally try a bagel! :)

## 2020-05-27 NOTE — Progress Notes (Signed)
Patient: Luis Herrera  DOB: January 30, 1941 MRN: [id number] PCP: [name], MD     Subjective:   Chief Complaint  Patient presents with  . Follow-up      Luis Herrera is a 80 y.o. male previously treated for left prosthetic knee infection.   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 Tessie Fass.  Original implant in 2013. Also of note he has a bioprosthetic aortic valve replacement as well. Early July - arthrocentesis with WBC 27,000 84% PMNs. No growth on plate.  Due to concern over infection he was referred to Dr. Tessie Fass for further management. No fevers, chills or malaise.  Operative report reviewed - underwent revision of the tibial component with wound VAC application. Intraoperative cultures did not yield any growth of an organism. Course was complicated by a hematoma involving suprapatella space.  He was started empirically on vancomycin and cefepime --> changed to Vancomycin and Ceftriaxone. Developed AKI on Vancomycin requiring hospitalization at [facility] >> switched to Ceftriaxone IV and PO doxy. He continued on PO doxycycline for ongoing treatment to complete 6 months and stopped all antibiotics in January 2022.     HPI Luis Herrera is been doing well since her last office visit in February.  He has been off antibiotics now for over 3 months and has experienced no negative changes to his left knee.  He still struggles with the same quality swelling in the lower legs bilaterally.  Hinged pain randomly in the morning that resolves without intervention is noted occasionally.  He has no restricted range of motion, swelling, stiffness, erythema or pain.  No fevers no chills.  Incision remains well-healed and intact.    Review of Systems  Constitutional: Negative for chills and fever.  HENT: Negative for tinnitus.   Eyes: Negative for blurred vision and photophobia.  Respiratory: Negative for cough and sputum production.    Cardiovascular: Negative for chest pain.  Gastrointestinal: Negative for diarrhea, nausea and vomiting.  Genitourinary: Negative for dysuria.  Musculoskeletal: Negative for joint pain.  Skin: Negative for rash.  Neurological: Negative for headaches.    Past Medical History:  Diagnosis Date  . Aortic stenosis 2008  . Arthritis   . Blood transfusion   . Blood transfusion without reported diagnosis   . CAD (coronary artery disease) 2008  . Cataract   . Complication of anesthesia    hallucinated once  . H/O hiatal hernia   . 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 Hx of colonic polyps   . Hyperlipidemia   . Hypertension   . Thyroid disease     Outpatient Medications Prior to Visit  Medication Sig Dispense Refill  . acetaminophen (TYLENOL) 500 MG tablet Take 1,000 mg by mouth as needed for moderate pain.    Marland Kitchen amiodarone (PACERONE) 200 MG tablet TAKE ONE-HALF TABLET BY  MOUTH DAILY 45 tablet 3  . amLODipine-olmesartan (AZOR) 5-20 MG tablet Take 1 tablet by mouth daily. 90 tablet 3  . aspirin EC 81 MG tablet Take 1 tablet (81 mg total) by mouth daily. Swallow whole. 90 tablet 3  . atorvastatin (LIPITOR) 80 MG tablet TAKE 1 TABLET BY MOUTH  DAILY AT 6 PM 90 tablet 3  . carvedilol (COREG) 6.25 MG tablet TAKE 1 TABLET BY MOUTH  TWICE DAILY 180 tablet 3  . ezetimibe (ZETIA) 10 MG tablet TAKE 1 TABLET BY MOUTH  DAILY 90 tablet 3  .  furosemide (LASIX) 40 MG tablet TAKE 1 TABLET BY MOUTH  DAILY TAKE ADDITIONAL  TABLET AS NEEDED FOR  SWELLING AND OR WEIGHT 135 tablet 2  . levothyroxine (SYNTHROID) 50 MCG tablet TAKE 1 TABLET BY MOUTH  DAILY BEFORE BREAKFAST 90 tablet 3  . Multiple Vitamin (MULTIVITAMIN WITH MINERALS) TABS Take 1 tablet by mouth daily.    . potassium chloride SA (KLOR-CON) 20 MEQ tablet Take 3 tablets the first day, then take one tablet daily 90 tablet 3   No facility-administered medications prior to visit.     Allergies  Allergen Reactions  .  Sulfamethoxazole-Trimethoprim Other (See Comments)    Luis Herrera Syndrome    Social History   Tobacco Use  . Smoking status: Former Smoker    Quit date: 05/25/1973    Years since quitting: 47.0  . Smokeless tobacco: Never Used  Vaping Use  . Vaping Use: Never used  Substance Use Topics  . Alcohol use: Not Currently    Comment: rare  . Drug use: No    Objective:   Vitals:   05/27/20 1101  BP: (!) 152/69  Pulse: 63  Temp: 97.6 F (36.4 C)  TempSrc: Oral  SpO2: 97%  Weight: 221 lb (100.2 kg)   Body mass index is 30.82 kg/m.  Physical Exam Constitutional:      Appearance: Normal appearance. He is not ill-appearing.  Pulmonary:     Effort: Pulmonary effort is normal.  Musculoskeletal:        General: Normal range of motion.     Right lower leg: Edema (trace) present.     Left lower leg: Edema (trace) present.     Comments: L knee with full active ROM. No restrictions or stiffness. Well healed incision overlying the knee. No warmth or erythema. Slightly increased swelling compared to right knee but baseline for him.   Skin:    General: Skin is warm and dry.     Capillary Refill: Capillary refill takes less than 2 seconds.     Coloration: Skin is not jaundiced or pale.     Comments: Dry skin on LLE  Neurological:     Mental Status: He is alert and oriented to person, place, and time.  Psychiatric:        Mood and Affect: Mood normal.        Judgment: Judgment normal.      Lab Results: Lab Results  Component Value Date   WBC 8.3 03/27/2020   HGB 11.0 (L) 03/27/2020   HCT 33.1 (L) 03/27/2020   MCV 96.5 03/27/2020   PLT 185 03/27/2020    Lab Results  Component Value Date   CREATININE 1.15 12/18/2019   BUN 23 12/18/2019   NA 142 12/18/2019   K 4.2 12/18/2019   CL 107 (H) 12/18/2019   CO2 25 12/18/2019    Lab Results  Component Value Date   ALT 12 12/18/2019   AST 18 12/18/2019   ALKPHOS 97 12/18/2019   BILITOT 0.5 12/18/2019     Assessment &  Plan:   Patient Active Problem List   Diagnosis Date Noted  . Dizziness 12/05/2019  . Infection of prosthetic left knee joint ([place]) 10/17/2019  . Hypokalemia 10/17/2019  . Lower extremity edema 10/17/2019  . Pressure injury of skin 09/13/2019  . Symptomatic anemia 09/12/2019  . Anemia 09/12/2019  . Chest pain 04/13/2018  . Hypothyroidism 04/13/2018  . Aortic valve stenosis 09/27/2017  . Right bundle branch block 09/27/2017  . CAD (coronary artery  disease) 09/27/2017  . 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 Acquired claw toe of right foot 06/04/2016  . Class 1 obesity with body mass index (BMI) of 31.0 to 31.9 in adult 12/02/2011  . 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 Back pain of thoracolumbar region 09/01/2010  . Hyperlipidemia with target LDL less than 130 04/10/2010  . Essential hypertension 04/10/2010  . Aortic valve replaced 04/10/2010    Problem List Items Addressed This Visit      Unprioritized   Infection of prosthetic left knee joint ([place])    Off all antibiotics for 3 months now following appropriate course of treatment for culture-negative prosthetic knee infection and device retention.  He did have I&D with polyethylene liner exchange in July 2021.  He was not interested in an option for continuing suppressive antibiotics given side effects and we followed with him closely for signs of real current infection.  All inflammatory markers and exams since stopping antibiotics have been benign and very reassuring that he has achieved a good cure this infection.  I will defer blood work today and encouraged him to follow himself for any clinical changes with prompt return should he notice any pain, swelling, erythema, stiffness that is worsening of the left knee.  Would probably have him resume doxycycline if this were to happen given he has a replaced aortic valve in place to protect.        RTC ID clinic PRN   Janene Madeira, MSN, NP-C [facility]  Infectious Disease [email or web address] Pager: [phone] Office: [place]: [phone]   05/27/20  11:49 AM

## 2020-05-27 NOTE — Assessment & Plan Note (Signed)
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  He did have I&D with polyethylene liner exchange in July 2021.  He was not interested in an option for continuing suppressive antibiotics given side effects and we followed with him closely for signs of real current infection.  All inflammatory markers and exams since stopping antibiotics have been benign and very reassuring that he has achieved a good cure this infection.  I will defer blood work today and encouraged him to follow himself for any clinical changes with prompt return should he notice any pain, swelling, erythema, stiffness that is worsening of the left knee.  Would probably have him resume doxycycline if this were to happen given he has a replaced aortic valve in place to protect.

## 2020-05-28 ENCOUNTER — Telehealth: Payer: Self-pay | Admitting: Family Medicine

## 2020-05-28 NOTE — Telephone Encounter (Signed)
Left message for patient to call back and schedule Medicare Annual Wellness Visit (AWV) either virtually or in office. No detailed message left   Last AWV awvs per [place] 08/17/11   please schedule at anytime with [facility] Nurse Health Advisor 1 or 2  Patient also needs appointment with pcp last appointment 04/19/2018   This should be a 45 minute visit.

## 2020-06-03 ENCOUNTER — Other Ambulatory Visit: Payer: Self-pay

## 2020-06-03 ENCOUNTER — Other Ambulatory Visit: Payer: Medicare Other

## 2020-06-03 ENCOUNTER — Telehealth: Payer: Self-pay

## 2020-06-03 DIAGNOSIS — Z8739 Personal history of other diseases of the musculoskeletal system and connective tissue: Secondary | ICD-10-CM

## 2020-06-03 NOTE — Telephone Encounter (Signed)
Patient called concerned about possible infection return in his knee. He reports pain that "comes and goes" but it's about the same as it's been. Doesn't report increased swelling or redness. No fevers or chills. Requesting labs to confirm that infection is gone.   Kyndra Condron Lorita Officer, RN

## 2020-06-03 NOTE — Telephone Encounter (Signed)
Patient scheduled for lab work today. Lab orders (CBC, ESR and CRP) placed.  Osvaldo Lamping Lorita Officer, RN

## 2020-06-04 ENCOUNTER — Telehealth: Payer: Self-pay | Admitting: Internal Medicine

## 2020-06-04 ENCOUNTER — Telehealth: Payer: Self-pay

## 2020-06-04 LAB — CBC WITH DIFFERENTIAL/PLATELET
Absolute Monocytes: 498 cells/uL (ref 200–950)
Basophils Absolute: 58 cells/uL (ref 0–200)
Basophils Relative: 0.7 %
Eosinophils Absolute: 540 cells/uL — ABNORMAL HIGH (ref 15–500)
Eosinophils Relative: 6.5 %
HCT: 37.9 % — ABNORMAL LOW (ref 38.5–50.0)
Hemoglobin: 12.2 g/dL — ABNORMAL LOW (ref 13.2–17.1)
Lymphs Abs: 1718 cells/uL (ref 850–3900)
MCH: 30.7 pg (ref 27.0–33.0)
MCHC: 32.2 g/dL (ref 32.0–36.0)
MCV: 95.2 fL (ref 80.0–100.0)
MPV: 11.3 fL (ref 7.5–12.5)
Monocytes Relative: 6 %
Neutro Abs: 5486 cells/uL (ref 1500–7800)
Neutrophils Relative %: 66.1 %
Platelets: 175 10*3/uL (ref 140–400)
RBC: 3.98 10*6/uL — ABNORMAL LOW (ref 4.20–5.80)
RDW: 12.1 % (ref 11.0–15.0)
Total Lymphocyte: 20.7 %
WBC: 8.3 10*3/uL (ref 3.8–10.8)

## 2020-06-04 LAB — SEDIMENTATION RATE: Sed Rate: 19 mm/h (ref 0–20)

## 2020-06-04 LAB — C-REACTIVE PROTEIN: CRP: 0.6 mg/L (ref <8.0)

## 2020-06-04 NOTE — Telephone Encounter (Signed)
-----   Message from  Callas, NP sent at 06/04/2020 10:22 AM EDT ----- Please call Luis Herrera to let him know that his blood work is perfectly normal and there's no findings indictating  infection. Please have him continue to monitor off antibiotics, in particular if he has pain that does not go away or a change in range of motion/stiffness. Happy to have him back for blood work again in 1 to 2 months if he wants more reassurance.

## 2020-06-04 NOTE — Telephone Encounter (Signed)
Patient returning call. RN relayed Stephanie's message that his blood work came back normal and that there is no indication of infection. RN advised patient to continue to monitor his symptoms off antibiotics and to let us know if he develops any pain that won't go away or any changes in range of motion/stiffness. Patient verbalized understanding and has no further questions.  RN offered lab appointment for reassurance in 1-2 months. Patient states he will call back around that time to schedule a lab appointment.   Beryle Flock, RN

## 2020-06-04 NOTE — Telephone Encounter (Signed)
I did not need this encounter. °

## 2020-06-04 NOTE — Telephone Encounter (Signed)
Left voicemail requesting call back to discuss recent lab results. Forwarding to triage pool for follow up.   Devynne Sturdivant Lorita Officer, RN

## 2020-06-11 ENCOUNTER — Telehealth: Payer: Self-pay | Admitting: Internal Medicine

## 2020-06-11 NOTE — Telephone Encounter (Signed)
Luis Herrera is calling requesting to speak with Luis Herrera in regards to the status of an envelope Luis Herrera gave her. Please advise.

## 2020-06-13 DIAGNOSIS — H353131 Nonexudative age-related macular degeneration, bilateral, early dry stage: Secondary | ICD-10-CM | POA: Diagnosis not present

## 2020-06-13 DIAGNOSIS — H40013 Open angle with borderline findings, low risk, bilateral: Secondary | ICD-10-CM | POA: Diagnosis not present

## 2020-06-17 NOTE — Telephone Encounter (Signed)
Pt advised his Disability Placard is available to pick up will leave at the front office and place copy in his chart.

## 2020-07-19 DIAGNOSIS — D2371 Other benign neoplasm of skin of right lower limb, including hip: Secondary | ICD-10-CM | POA: Diagnosis not present

## 2020-07-19 DIAGNOSIS — M79671 Pain in right foot: Secondary | ICD-10-CM | POA: Diagnosis not present

## 2020-07-19 DIAGNOSIS — D2372 Other benign neoplasm of skin of left lower limb, including hip: Secondary | ICD-10-CM | POA: Diagnosis not present

## 2020-08-08 ENCOUNTER — Telehealth: Payer: Self-pay | Admitting: Family Medicine

## 2020-08-08 NOTE — Telephone Encounter (Signed)
Left message for patient to call back and schedule Medicare Annual Wellness Visit (AWV) either virtually or in office.   Last AWV 09/01/10  please schedule at anytime with [facility] Nurse Health Advisor 1 or 2  Patient also needs appointment with pcp last appointment 04/19/2018  This should be a 45 minute visit.

## 2020-08-16 DIAGNOSIS — B070 Plantar wart: Secondary | ICD-10-CM | POA: Diagnosis not present

## 2020-08-16 DIAGNOSIS — M79671 Pain in right foot: Secondary | ICD-10-CM | POA: Diagnosis not present

## 2020-09-04 ENCOUNTER — Other Ambulatory Visit: Payer: Self-pay

## 2020-09-05 ENCOUNTER — Ambulatory Visit (INDEPENDENT_AMBULATORY_CARE_PROVIDER_SITE_OTHER): Payer: Medicare Other | Admitting: Family Medicine

## 2020-09-05 ENCOUNTER — Encounter: Payer: Self-pay | Admitting: Family Medicine

## 2020-09-05 VITALS — BP 122/58 | HR 62 | Temp 97.7°F | Wt 226.0 lb

## 2020-09-05 DIAGNOSIS — N3001 Acute cystitis with hematuria: Secondary | ICD-10-CM | POA: Diagnosis not present

## 2020-09-05 DIAGNOSIS — R8290 Unspecified abnormal findings in urine: Secondary | ICD-10-CM | POA: Diagnosis not present

## 2020-09-05 LAB — POCT URINALYSIS DIPSTICK
Bilirubin, UA: NEGATIVE
Glucose, UA: NEGATIVE
Ketones, UA: NEGATIVE
Nitrite, UA: NEGATIVE
Protein, UA: POSITIVE — AB
Spec Grav, UA: 1.025 (ref 1.010–1.025)
Urobilinogen, UA: NEGATIVE E.U./dL — AB
pH, UA: 6 (ref 5.0–8.0)

## 2020-09-05 MED ORDER — CIPROFLOXACIN HCL 500 MG PO TABS
500.0000 mg | ORAL_TABLET | Freq: Two times a day (BID) | ORAL | 0 refills | Status: AC
Start: 2020-09-05 — End: 2020-09-12

## 2020-09-05 NOTE — Addendum Note (Signed)
Addended by: Anderson Malta on: 09/05/2020 03:14 PM   Modules accepted: Orders

## 2020-09-05 NOTE — Progress Notes (Signed)
Subjective:    Patient ID: Luis Herrera, male    DOB: 07/28/1940, 80 y.o.   MRN: [id number]  Chief Complaint  Patient presents with   urine odor     HPI Patient was seen today for ongoing concern.  Patient endorses malodorous urine x 6 months+.  Notes first thing in the morning and at night when urinating.  Denies nausea, vomiting, suprapubic pain, back pain, constipation, fever, chills, dysuria, changes in foods, or supplements.  Patient does not drink water.  Drinks flavored lemonades.  Past Medical History:  Diagnosis Date   Aortic stenosis 2008   Arthritis    Blood transfusion    Blood transfusion without reported diagnosis    CAD (coronary artery disease) 2008   Cataract    Complication of anesthesia    hallucinated once   H/O hiatal hernia    Heart murmur    AFTER REPLACED VALVE WENT AWAY   Hx of colonic polyps    Hyperlipidemia    Hypertension    Thyroid disease     Allergies  Allergen Reactions   Sulfamethoxazole-Trimethoprim Other (See Comments)    Carlyn Reichert Syndrome    ROS General: Denies fever, chills, night sweats, changes in weight, changes in appetite HEENT: Denies headaches, ear pain, changes in vision, rhinorrhea, sore throat CV: Denies CP, palpitations, SOB, orthopnea Pulm: Denies SOB, cough, wheezing GI: Denies abdominal pain, nausea, vomiting, diarrhea, constipation GU: Denies dysuria, hematuria, frequency  + malodorous urine Msk: Denies muscle cramps, joint pains Neuro: Denies weakness, numbness, tingling Skin: Denies rashes, bruising Psych: Denies depression, anxiety, hallucinations     Objective:    Blood pressure (!) 122/58, pulse 62, temperature 97.7 F (36.5 C), temperature source Oral, weight 226 lb (102.5 kg), SpO2 99 %.  Gen. Pleasant, well-nourished, in no distress, normal affect   HEENT: [place]/AT, face symmetric, conjunctiva clear, no scleral icterus, PERRLA, EOMI, nares patent without drainage, Lungs: no accessory muscle use,  CTAB, no wheezes or rales Cardiovascular: RRR, no m/r/g, no peripheral edema Musculoskeletal: No deformities, no cyanosis or clubbing, normal tone Neuro:  A&Ox3, CN II-XII intact, normal gait Skin:  Warm, no lesions/ rash   Wt Readings from Last 3 Encounters:  09/05/20 226 lb (102.5 kg)  05/27/20 221 lb (100.2 kg)  03/27/20 215 lb (97.5 kg)    Lab Results  Component Value Date   WBC 8.3 06/03/2020   HGB 12.2 (L) 06/03/2020   HCT 37.9 (L) 06/03/2020   PLT 175 06/03/2020   GLUCOSE 96 12/18/2019   CHOL 150 12/18/2019   TRIG 96 12/18/2019   HDL 54 12/18/2019   LDLCALC 78 12/18/2019   ALT 12 12/18/2019   AST 18 12/18/2019   NA 142 12/18/2019   K 4.2 12/18/2019   CL 107 (H) 12/18/2019   CREATININE 1.15 12/18/2019   BUN 23 12/18/2019   CO2 25 12/18/2019   TSH 2.660 05/22/2019   PSA 0.98 09/01/2010   INR 1.2 09/12/2019    Assessment/Plan:  Malodorous urine  - Plan: POCT urinalysis dipstick  Acute cystitis with hematuria  -UA with 3+ leuks and blood. -We will obtain urine culture -Increase p.o. intake of water and fluids. - Plan: Culture, Urine, ciprofloxacin (CIPRO) 500 MG tablet  F/u prn  Grier Mitts, MD

## 2020-09-06 LAB — URINE CULTURE
MICRO NUMBER:: 12147411
SPECIMEN QUALITY:: ADEQUATE

## 2020-09-12 ENCOUNTER — Telehealth: Payer: Self-pay | Admitting: Family Medicine

## 2020-09-12 NOTE — Telephone Encounter (Signed)
Pt is calling with question about a statement that is on his POCT Urinalysis dipstick results it reads as followed:  Result: Mixed genital flora isolated. These superficial bacteria are not indicative of a urinary tract infection. No further organism identification is warranted on this specimen. If clinically indicated, recollect clean-catch, mid-stream urine and transfer  immediately to Urine Culture Transport Tube.   Pt would like to have a call back.

## 2020-09-16 ENCOUNTER — Telehealth: Payer: Self-pay | Admitting: Family Medicine

## 2020-09-16 NOTE — Telephone Encounter (Signed)
Spoke with pt about note in his results, pt understood.

## 2020-09-16 NOTE — Telephone Encounter (Signed)
Left message for patient to call back and schedule Medicare Annual Wellness Visit (AWV) either virtually or in office.  awvs [facility] 08/17/11  please schedule at anytime with [facility] Nurse Health Advisor 1 or 2   This should be a 45 minute visit.

## 2020-09-16 NOTE — Telephone Encounter (Signed)
Called pt, no answer, left vm to call office.

## 2020-10-16 ENCOUNTER — Telehealth (INDEPENDENT_AMBULATORY_CARE_PROVIDER_SITE_OTHER): Payer: Medicare Other | Admitting: Family Medicine

## 2020-10-16 ENCOUNTER — Encounter: Payer: Self-pay | Admitting: Family Medicine

## 2020-10-16 VITALS — Ht 71.0 in

## 2020-10-16 DIAGNOSIS — J069 Acute upper respiratory infection, unspecified: Secondary | ICD-10-CM | POA: Diagnosis not present

## 2020-10-16 NOTE — Progress Notes (Signed)
Virtual Visit via Video Note  I connected with Luis Herrera on 10/16/20 at  4:30 PM EDT by a video enabled telemedicine application 2/2 [id number] pandemic and verified that I am speaking with the correct person using two identifiers.  Location patient: home Location provider:work or home office Persons participating in the virtual visit: patient, provider  I discussed the limitations of evaluation and management by telemedicine and the availability of in person appointments. The patient expressed understanding and agreed to proceed.  HPI: Pt had a head cold last (stuffy nose) wk.  It went into his chest causing wheezing.  Pt unsure of what to take due to HTN.  Notes some improvement in symptoms, but still with cough and rhinorrhea in a.m.  Had loose stools yesterday.  Appetite is good.  Pt had a negative COVID test.   Denies HAs, fever, ear pain or pressure, n/v.   ROS: See pertinent positives and negatives per HPI.  Past Medical History:  Diagnosis Date   Aortic stenosis 2008   Arthritis    Blood transfusion    Blood transfusion without reported diagnosis    CAD (coronary artery disease) 2008   Cataract    Complication of anesthesia    hallucinated once   H/O hiatal hernia    Heart murmur    AFTER REPLACED VALVE WENT AWAY   Hx of colonic polyps    Hyperlipidemia    Hypertension    Thyroid disease     Past Surgical History:  Procedure Laterality Date   AORTIC VALVE REPLACEMENT  2009   Bioprosthetic at [place]  2008   [place] GRAFT  2009   SVG-PDA w/ AVR   CORRECTION HAMMER TOE  2011   BILATERAL   GASTROCNEMIUS RECESSION  05/28/2011   POLYPECTOMY     TONSILLECTOMY     TOTAL HIP ARTHROPLASTY Left 2000   TOTAL KNEE ARTHROPLASTY  2000   left   TOTAL KNEE ARTHROPLASTY  12/01/2011   Procedure: TOTAL KNEE ARTHROPLASTY;  Surgeon: Mauri Pole, MD;  Location:  [facility];  Service: Orthopedics;  Laterality: Right;   WEIL OSTEOTOMY  05/28/2011    Family History  Problem Relation Age of Onset   Colon cancer Mother 78   Heart attack Father    Hypertension Other    Hyperlipidemia Other    Heart attack Brother    Hypertension Sister    Hypertension Brother    Stroke Neg Hx    Esophageal cancer Neg Hx    Rectal cancer Neg Hx    Stomach cancer Neg Hx      Current Outpatient Medications:    acetaminophen (TYLENOL) 500 MG tablet, Take 1,000 mg by mouth as needed for moderate pain., Disp: , Rfl:    amiodarone (PACERONE) 200 MG tablet, TAKE ONE-HALF TABLET BY  MOUTH DAILY, Disp: 45 tablet, Rfl: 3   amLODipine-olmesartan (AZOR) 5-20 MG tablet, Take 1 tablet by mouth daily., Disp: 90 tablet, Rfl: 3   aspirin EC 81 MG tablet, Take 1 tablet (81 mg total) by mouth daily. Swallow whole., Disp: 90 tablet, Rfl: 3   atorvastatin (LIPITOR) 80 MG tablet, TAKE 1 TABLET BY MOUTH  DAILY AT 6 PM, Disp: 90 tablet, Rfl: 3   carvedilol (COREG) 6.25 MG tablet, TAKE 1 TABLET BY MOUTH  TWICE DAILY, Disp: 180 tablet, Rfl: 3   ezetimibe (ZETIA) 10 MG tablet,  TAKE 1 TABLET BY MOUTH  DAILY, Disp: 90 tablet, Rfl: 3   furosemide (LASIX) 40 MG tablet, TAKE 1 TABLET BY MOUTH  DAILY TAKE ADDITIONAL  TABLET AS NEEDED FOR  SWELLING AND OR WEIGHT, Disp: 135 tablet, Rfl: 2   levothyroxine (SYNTHROID) 50 MCG tablet, TAKE 1 TABLET BY MOUTH  DAILY BEFORE BREAKFAST, Disp: 90 tablet, Rfl: 3   Multiple Vitamin (MULTIVITAMIN WITH MINERALS) TABS, Take 1 tablet by mouth daily., Disp: , Rfl:    potassium chloride SA (KLOR-CON) 20 MEQ tablet, Take 3 tablets the first day, then take one tablet daily, Disp: 90 tablet, Rfl: 3  EXAM:  VITALS per patient if applicable: RR between 123456 bpm  GENERAL: alert, oriented, appears well and in no acute distress  HEENT: atraumatic, conjunctiva clear, no obvious abnormalities on inspection of external nose and ears  NECK: normal movements of the head and  neck  LUNGS: on inspection no signs of respiratory distress, breathing rate appears normal, no obvious gross SOB, gasping or wheezing  CV: no obvious cyanosis  MS: moves all visible extremities without noticeable abnormality  PSYCH/NEURO: pleasant and cooperative, no obvious depression or anxiety, speech and thought processing grossly intact  ASSESSMENT AND PLAN:  Discussed the following assessment and plan:  Viral URI with cough -Symptoms improving -Continue treatment of symptoms with OTC medications/home remedies -Advise Coricidin HBP okay to take.  In general avoid decongestant medications. -We will continue over-the-counter medications and notify clinic on Friday for any worsened symptoms. -Given strict precautions  Follow-up in the next few days if needed   I discussed the assessment and treatment plan with the patient. The patient was provided an opportunity to ask questions and all were answered. The patient agreed with the plan and demonstrated an understanding of the instructions.   The patient was advised to call back or seek an in-person evaluation if the symptoms worsen or if the condition fails to improve as anticipated.   [name], MD

## 2020-10-28 DIAGNOSIS — M19071 Primary osteoarthritis, right ankle and foot: Secondary | ICD-10-CM | POA: Diagnosis not present

## 2020-10-28 DIAGNOSIS — M792 Neuralgia and neuritis, unspecified: Secondary | ICD-10-CM | POA: Diagnosis not present

## 2020-10-28 DIAGNOSIS — M19072 Primary osteoarthritis, left ankle and foot: Secondary | ICD-10-CM | POA: Diagnosis not present

## 2020-10-28 DIAGNOSIS — I70203 Unspecified atherosclerosis of native arteries of extremities, bilateral legs: Secondary | ICD-10-CM | POA: Diagnosis not present

## 2020-10-28 DIAGNOSIS — M79671 Pain in right foot: Secondary | ICD-10-CM | POA: Diagnosis not present

## 2020-10-28 DIAGNOSIS — B351 Tinea unguium: Secondary | ICD-10-CM | POA: Diagnosis not present

## 2020-10-28 DIAGNOSIS — L84 Corns and callosities: Secondary | ICD-10-CM | POA: Diagnosis not present

## 2020-11-02 ENCOUNTER — Other Ambulatory Visit: Payer: Self-pay | Admitting: Internal Medicine

## 2020-11-15 DIAGNOSIS — Z85828 Personal history of other malignant neoplasm of skin: Secondary | ICD-10-CM | POA: Diagnosis not present

## 2020-11-15 DIAGNOSIS — C44619 Basal cell carcinoma of skin of left upper limb, including shoulder: Secondary | ICD-10-CM | POA: Diagnosis not present

## 2020-11-15 DIAGNOSIS — L821 Other seborrheic keratosis: Secondary | ICD-10-CM | POA: Diagnosis not present

## 2020-11-15 DIAGNOSIS — L570 Actinic keratosis: Secondary | ICD-10-CM | POA: Diagnosis not present

## 2020-11-15 DIAGNOSIS — D225 Melanocytic nevi of trunk: Secondary | ICD-10-CM | POA: Diagnosis not present

## 2020-11-15 DIAGNOSIS — D1801 Hemangioma of skin and subcutaneous tissue: Secondary | ICD-10-CM | POA: Diagnosis not present

## 2020-11-15 DIAGNOSIS — C44719 Basal cell carcinoma of skin of left lower limb, including hip: Secondary | ICD-10-CM | POA: Diagnosis not present

## 2020-11-15 DIAGNOSIS — L814 Other melanin hyperpigmentation: Secondary | ICD-10-CM | POA: Diagnosis not present

## 2020-11-15 DIAGNOSIS — C4442 Squamous cell carcinoma of skin of scalp and neck: Secondary | ICD-10-CM | POA: Diagnosis not present

## 2020-11-19 DIAGNOSIS — R3914 Feeling of incomplete bladder emptying: Secondary | ICD-10-CM | POA: Diagnosis not present

## 2020-12-16 ENCOUNTER — Other Ambulatory Visit: Payer: Self-pay | Admitting: Internal Medicine

## 2020-12-17 DIAGNOSIS — H40013 Open angle with borderline findings, low risk, bilateral: Secondary | ICD-10-CM | POA: Diagnosis not present

## 2020-12-17 DIAGNOSIS — H353131 Nonexudative age-related macular degeneration, bilateral, early dry stage: Secondary | ICD-10-CM | POA: Diagnosis not present

## 2020-12-23 DIAGNOSIS — D2372 Other benign neoplasm of skin of left lower limb, including hip: Secondary | ICD-10-CM | POA: Diagnosis not present

## 2020-12-27 ENCOUNTER — Ambulatory Visit (INDEPENDENT_AMBULATORY_CARE_PROVIDER_SITE_OTHER): Payer: Medicare Other

## 2020-12-27 DIAGNOSIS — Z23 Encounter for immunization: Secondary | ICD-10-CM

## 2020-12-29 ENCOUNTER — Other Ambulatory Visit: Payer: Self-pay | Admitting: Internal Medicine

## 2020-12-30 DIAGNOSIS — H1045 Other chronic allergic conjunctivitis: Secondary | ICD-10-CM | POA: Diagnosis not present

## 2020-12-30 DIAGNOSIS — H11221 Conjunctival granuloma, right eye: Secondary | ICD-10-CM | POA: Diagnosis not present

## 2020-12-31 ENCOUNTER — Other Ambulatory Visit: Payer: Self-pay

## 2020-12-31 ENCOUNTER — Encounter: Payer: Self-pay | Admitting: Internal Medicine

## 2020-12-31 ENCOUNTER — Ambulatory Visit: Payer: Medicare Other | Admitting: Internal Medicine

## 2020-12-31 VITALS — BP 120/60 | HR 62 | Ht 71.0 in | Wt 232.0 lb

## 2020-12-31 DIAGNOSIS — Z79899 Other long term (current) drug therapy: Secondary | ICD-10-CM | POA: Diagnosis not present

## 2020-12-31 DIAGNOSIS — I493 Ventricular premature depolarization: Secondary | ICD-10-CM | POA: Diagnosis not present

## 2020-12-31 DIAGNOSIS — I2510 Atherosclerotic heart disease of native coronary artery without angina pectoris: Secondary | ICD-10-CM

## 2020-12-31 LAB — CBC WITH DIFFERENTIAL/PLATELET
Basophils Absolute: 0.1 10*3/uL (ref 0.0–0.2)
Basos: 1 %
EOS (ABSOLUTE): 0.5 10*3/uL — ABNORMAL HIGH (ref 0.0–0.4)
Eos: 7 %
Hematocrit: 36.7 % — ABNORMAL LOW (ref 37.5–51.0)
Hemoglobin: 12.2 g/dL — ABNORMAL LOW (ref 13.0–17.7)
Immature Grans (Abs): 0 10*3/uL (ref 0.0–0.1)
Immature Granulocytes: 0 %
Lymphocytes Absolute: 1.6 10*3/uL (ref 0.7–3.1)
Lymphs: 20 %
MCH: 30.6 pg (ref 26.6–33.0)
MCHC: 33.2 g/dL (ref 31.5–35.7)
MCV: 92 fL (ref 79–97)
Monocytes Absolute: 0.7 10*3/uL (ref 0.1–0.9)
Monocytes: 8 %
Neutrophils Absolute: 5.2 10*3/uL (ref 1.4–7.0)
Neutrophils: 64 %
Platelets: 173 10*3/uL (ref 150–450)
RBC: 3.99 x10E6/uL — ABNORMAL LOW (ref 4.14–5.80)
RDW: 12.4 % (ref 11.6–15.4)
WBC: 8.1 10*3/uL (ref 3.4–10.8)

## 2020-12-31 LAB — HEPATIC FUNCTION PANEL
ALT: 13 IU/L (ref 0–44)
AST: 14 IU/L (ref 0–40)
Albumin: 4.1 g/dL (ref 3.7–4.7)
Alkaline Phosphatase: 127 IU/L — ABNORMAL HIGH (ref 44–121)
Bilirubin Total: 0.7 mg/dL (ref 0.0–1.2)
Bilirubin, Direct: 0.17 mg/dL (ref 0.00–0.40)
Total Protein: 6.9 g/dL (ref 6.0–8.5)

## 2020-12-31 LAB — TSH: TSH: 4.3 u[IU]/mL (ref 0.450–4.500)

## 2020-12-31 LAB — SEDIMENTATION RATE: Sed Rate: 22 mm/hr (ref 0–30)

## 2020-12-31 NOTE — Progress Notes (Signed)
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 He is a pleasant 80 yo man with a h/o CAD, s/p CABG, AS, s/p AVR, HTN who I initially saw for PVC's a couple of years ago. We placed him on low dose amiodarone and his dose has been gradually decreased. 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 The patient has been saddened by the loss of his daughter who died suddenly in the setting of ETOH abuse. His palpitations are well controlled. His cardiac monitor a year ago showed about 12%PVC's.  Allergies  Allergen Reactions   Sulfamethoxazole-Trimethoprim Other (See Comments)    Carlyn Reichert Syndrome     Current Outpatient Medications  Medication Sig Dispense Refill   acetaminophen (TYLENOL) 500 MG tablet Take 1,000 mg by mouth as needed for moderate pain.     amiodarone (PACERONE) 200 MG tablet Take 0.5 tablets (100 mg total) by mouth daily. 45 tablet 0   amLODipine-olmesartan (AZOR) 5-20 MG tablet Take 1 tablet by mouth daily. 90 tablet 3   aspirin EC 81 MG tablet Take 1 tablet (81 mg total) by mouth daily. Swallow whole. 90 tablet 3   atorvastatin (LIPITOR) 80 MG tablet Take 1 tablet (80 mg total) by mouth daily. 90 tablet 0   carvedilol (COREG) 6.25 MG tablet TAKE 1 TABLET BY MOUTH  TWICE DAILY 120 tablet 5   ezetimibe (ZETIA) 10 MG tablet TAKE 1 TABLET BY MOUTH  DAILY 30 tablet 2   furosemide (LASIX) 40 MG tablet TAKE 1 TABLET BY MOUTH  DAILY TAKE ADDITIONAL  TABLET AS NEEDED FOR  SWELLING AND OR WEIGHT 135 tablet 2   levothyroxine (SYNTHROID) 50 MCG tablet Take 1 tablet (50 mcg total) by mouth daily before breakfast. 90 tablet 0   Multiple Vitamin (MULTIVITAMIN WITH MINERALS) TABS Take 1 tablet by mouth daily.     potassium chloride SA (KLOR-CON) 20 MEQ tablet Take 3 tablets the first day, then take one tablet daily 90 tablet 3   No current facility-administered medications for this visit.     Past Medical History:  Diagnosis Date   Aortic stenosis  2008   Arthritis    Blood transfusion    Blood transfusion without reported diagnosis    CAD (coronary artery disease) 2008   Cataract    Complication of anesthesia    hallucinated once   H/O hiatal hernia    Heart murmur    AFTER REPLACED VALVE WENT AWAY   Hx of colonic polyps    Hyperlipidemia    Hypertension    Thyroid disease     ROS:   All systems reviewed and negative except as noted in the HPI.   Past Surgical History:  Procedure Laterality Date   AORTIC VALVE REPLACEMENT  2009   Bioprosthetic at [place]  2008   CHOLECYSTECTOMY  1982   COLONOSCOPY     CORONARY ARTERY BYPASS GRAFT  2009   SVG-PDA w/ AVR   CORRECTION HAMMER TOE  2011   BILATERAL   GASTROCNEMIUS RECESSION  05/28/2011   POLYPECTOMY     TONSILLECTOMY     TOTAL HIP ARTHROPLASTY Left 2000   TOTAL KNEE ARTHROPLASTY  2000   left   TOTAL KNEE ARTHROPLASTY  12/01/2011   Procedure: TOTAL KNEE ARTHROPLASTY;  Surgeon: Mauri Pole, MD;  Location: [facility];  Service: Orthopedics;  Laterality: Right;   WEIL OSTEOTOMY  05/28/2011  Family History  Problem Relation Age of Onset   Colon cancer Mother 32   Heart attack Father    Hypertension Other    Hyperlipidemia Other    Heart attack Brother    Hypertension Sister    Hypertension Brother    Stroke Neg Hx    Esophageal cancer Neg Hx    Rectal cancer Neg Hx    Stomach cancer Neg Hx      Social History   Socioeconomic History   Marital status: Married    Spouse name: Not on file   Number of children: Not on file   Years of education: Not on file   Highest education level: Not on file  Occupational History   Occupation: retired  Tobacco Use   Smoking status: Former    Types: Cigarettes    Quit date: 05/25/1973    Years since quitting: 47.6   Smokeless tobacco: Never  Vaping Use   Vaping Use: Never used  Substance and Sexual Activity   Alcohol use: Not Currently    Comment:  rare   Drug use: No   Sexual activity: Not Currently  Other Topics Concern   Not on file  Social History Narrative   Regular exercise-yes. Pt lives in [place] with his wife.   Social Determinants of Health   Financial Resource Strain: Not on file  Food Insecurity: Not on file  Transportation Needs: Not on file  Physical Activity: Not on file  Stress: Not on file  Social Connections: Not on file  Intimate Partner Violence: Not on file     BP 120/60   Pulse 62   Ht 5\' 11"  (1.803 m)   Wt 232 lb (105.2 kg)   SpO2 98%   BMI 32.36 kg/m   Physical Exam:  Well appearing NAD HEENT: Unremarkable Neck:  No JVD, no thyromegally Lymphatics:  No adenopathy Back:  No CVA tenderness Lungs:  Clear with no wheezes HEART:  Regular rate rhythm, no murmurs, no rubs, no clicks Abd:  soft, positive bowel sounds, no organomegally, no rebound, no guarding Ext:  2 plus pulses, no edema, no cyanosis, no clubbing Skin:  No rashes no nodules Neuro:  CN II through XII intact, motor grossly intact  EKG - nsr with RBBB  Assess/Plan:  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  CAD - he denies anginal symptoms.  Obesity - his weight is improved though he needs to lose more. Dyslipidemia - he will continue zetia and lipitor.  Carleene Overlie Kainon Varady,MD

## 2020-12-31 NOTE — Patient Instructions (Addendum)
Medication Instructions:  Your physician recommends that you continue on your current medications as directed. Please refer to the Current Medication list given to you today.  Labwork: You will get lab work today:  TSH, liver panel, CBC with diff, ESR  Testing/Procedures: None ordered.  Follow-Up: Your physician wants you to follow-up in: one year with Cristopher Peru, MD    Any Other Special Instructions Will Be Listed Below (If Applicable).  If you need a refill on your cardiac medications before your next appointment, please call your pharmacy.

## 2021-01-11 ENCOUNTER — Other Ambulatory Visit: Payer: Self-pay | Admitting: Internal Medicine

## 2021-01-14 MED ORDER — AMLODIPINE-OLMESARTAN 5-20 MG PO TABS: 1.0000 | ORAL_TABLET | Freq: Every day | ORAL | 0 refills | Status: DC

## 2021-01-26 ENCOUNTER — Other Ambulatory Visit: Payer: Self-pay | Admitting: Internal Medicine

## 2021-01-28 ENCOUNTER — Telehealth: Payer: Self-pay | Admitting: Family Medicine

## 2021-01-28 NOTE — Telephone Encounter (Signed)
Spoke with patient schedule Medicare Annual Wellness Visit (AWV) either virtually or in office. Left  my Luis Herrera number [phone]  Patient stated he would call back   awvs per [facility] 08/17/11 ; please schedule at anytime with [facility] Nurse Health Advisor 1 or 2   This should be a 45 minute visit.

## 2021-02-18 ENCOUNTER — Other Ambulatory Visit: Payer: Self-pay | Admitting: *Deleted

## 2021-02-18 MED ORDER — EZETIMIBE 10 MG PO TABS: 10.0000 mg | ORAL_TABLET | Freq: Every day | ORAL | 0 refills | Status: DC

## 2021-02-19 MED ORDER — EZETIMIBE 10 MG PO TABS: 10.0000 mg | ORAL_TABLET | Freq: Every day | ORAL | 0 refills | Status: DC

## 2021-02-19 NOTE — Addendum Note (Signed)
Addended by: Carter Kitten D on: 02/19/2021 02:02 PM   Modules accepted: Orders

## 2021-03-07 ENCOUNTER — Other Ambulatory Visit: Payer: Self-pay | Admitting: Internal Medicine

## 2021-03-10 ENCOUNTER — Other Ambulatory Visit: Payer: Self-pay | Admitting: Internal Medicine

## 2021-03-18 DIAGNOSIS — D2372 Other benign neoplasm of skin of left lower limb, including hip: Secondary | ICD-10-CM | POA: Diagnosis not present

## 2021-03-25 ENCOUNTER — Telehealth: Payer: Self-pay | Admitting: Family Medicine

## 2021-03-25 NOTE — Telephone Encounter (Signed)
Spoke with patient to schedule Medicare Annual Wellness Visit (AWV) either virtually or in office.   He stated he needed to check his calendar and will call back to schedule   awvs [facility] 08/17/11  please schedule at anytime with [facility] Nurse Health Advisor 1 or 2   This should be a 45 minute visit.

## 2021-04-02 ENCOUNTER — Ambulatory Visit: Payer: Medicare Other | Admitting: Podiatry

## 2021-04-02 ENCOUNTER — Other Ambulatory Visit: Payer: Self-pay

## 2021-04-02 DIAGNOSIS — L989 Disorder of the skin and subcutaneous tissue, unspecified: Secondary | ICD-10-CM | POA: Diagnosis not present

## 2021-04-02 DIAGNOSIS — M79675 Pain in left toe(s): Secondary | ICD-10-CM | POA: Diagnosis not present

## 2021-04-02 DIAGNOSIS — B351 Tinea unguium: Secondary | ICD-10-CM | POA: Diagnosis not present

## 2021-04-02 DIAGNOSIS — M79674 Pain in right toe(s): Secondary | ICD-10-CM

## 2021-04-02 NOTE — Progress Notes (Signed)
° °  SUBJECTIVE Patient presents to office today complaining of elongated, thickened nails that cause pain while ambulating in shoes.  Patient is unable to trim their own nails.  Patient also complains of an ingrowing toenail to the medial border of the left great toe which is very symptomatic and incurvated.  Patient is here for further evaluation and treatment.  Past Medical History:  Diagnosis Date   Aortic stenosis 2008   Arthritis    Blood transfusion    Blood transfusion without reported diagnosis    CAD (coronary artery disease) 2008   Cataract    Complication of anesthesia    hallucinated once   H/O hiatal hernia    Heart murmur    AFTER REPLACED VALVE WENT AWAY   Hx of colonic polyps    Hyperlipidemia    Hypertension    Thyroid disease     OBJECTIVE General Patient is awake, alert, and oriented x 3 and in no acute distress. Derm Skin is dry and supple bilateral. Negative open lesions or macerations. Remaining integument unremarkable. Nails are tender, long, thickened and dystrophic with subungual debris, consistent with onychomycosis, 1-5 bilateral. No signs of infection noted.  There is an incurvated ingrowing nail to the medial border of the left great toe.  This is very symptomatic to light touch.  Hyperkeratotic lesions also noted to the weightbearing surfaces of the feet with a central nucleated core consistent with a porokeratosis Vasc  DP and PT pedal pulses palpable bilaterally. Temperature gradient within normal limits.  Neuro Epicritic and protective threshold sensation grossly intact bilaterally.  Musculoskeletal Exam No symptomatic pedal deformities noted bilateral. Muscular strength within normal limits.  ASSESSMENT 1.  Pain due to onychomycosis of toenails both 2.  Ingrowing toenail medial border left great toe 3.  Benign skin lesions bilateral feet  PLAN OF CARE 1. Patient evaluated today.  2. Instructed to maintain good pedal hygiene and foot care.  3.  Mechanical debridement of nails 1-5 bilaterally performed using a nail nipper. Filed with dremel without incident.  4.  Excisional debridement of the hyperkeratotic callus tissue was performed using a tissue nipper without incident or bleeding  5.  The offending border of the ingrown nail plate was debrided away and the patient did feel relief.   6.  Return to clinic in 2 weeks.  If the ingrown portion of the nail is still symptomatic then we will proceed with a partial nail matricectomy of the offending border of the ingrowing nail   Edrick Kins, DPM [facility]  Dr. Edrick Kins, DPM    [address], [place] [address]                Office [phone]  Fax [phone]

## 2021-04-14 ENCOUNTER — Ambulatory Visit: Payer: Medicare Other | Admitting: Podiatry

## 2021-04-14 DIAGNOSIS — M25552 Pain in left hip: Secondary | ICD-10-CM | POA: Diagnosis not present

## 2021-04-14 DIAGNOSIS — M25561 Pain in right knee: Secondary | ICD-10-CM | POA: Diagnosis not present

## 2021-04-14 NOTE — Progress Notes (Signed)
? ?Cardiology Office Note ? ? ?Date:  04/18/2021  ? ?ID:  Luis Herrera, DOB 05-Apr-1940, MRN [id number] ? ?PCP:  [name], MD  ?Cardiologist:   [name], MD  ? ?F/U of CAD  ? ?  ?History of Present Illness: ?Luis Herrera is a 81 y.o. male with a history of CAD (s/p CABG with SVG to PDA), AV dz (s/p AVR) HTN and PVCs  Pt  is also followed by [name]   Amiodarone is being used to suppress PVC    ?   ?I saw the pt in Nov 2021   He was seen by [name] in Nov 2022 ? ?Since seen he denies skips/palpitations  Denies CP  He does says that he has had some wheezing    Some swelling in legs     He is going to see ortho about his other knee   ? ?Current Meds  ?Medication Sig  ? acetaminophen (TYLENOL) 500 MG tablet Take 1,000 mg by mouth as needed for moderate pain.  ? amiodarone (PACERONE) 200 MG tablet TAKE ONE-HALF TABLET BY  MOUTH DAILY  ? amLODipine-olmesartan (AZOR) 5-20 MG tablet Take 1 tablet by mouth daily.  ? aspirin EC 81 MG tablet Take 1 tablet (81 mg total) by mouth daily. Swallow whole.  ? atorvastatin (LIPITOR) 80 MG tablet TAKE 1 TABLET BY MOUTH  DAILY  ? carvedilol (COREG) 6.25 MG tablet TAKE 1 TABLET BY MOUTH  TWICE DAILY  ? ezetimibe (ZETIA) 10 MG tablet Take 1 tablet (10 mg total) by mouth daily.  ? furosemide (LASIX) 40 MG tablet TAKE 1 TABLET BY MOUTH  DAILY TAKE ADDITIONAL  TABLET AS NEEDED FOR  SWELLING AND OR WEIGHT  ? levothyroxine (SYNTHROID) 50 MCG tablet TAKE 1 TABLET BY MOUTH  DAILY BEFORE BREAKFAST  ? Multiple Vitamin (MULTIVITAMIN WITH MINERALS) TABS Take 1 tablet by mouth daily.  ? potassium chloride SA (KLOR-CON) 20 MEQ tablet Take 3 tablets the first day, then take one tablet daily  ? ? ? ?Allergies:   Sulfamethoxazole-trimethoprim, Sulfa antibiotics, and Sulfasalazine  ? ?Past Medical History:  ?Diagnosis Date  ? Aortic stenosis 2008  ? Arthritis   ? Blood transfusion   ? Blood transfusion without reported diagnosis   ? CAD (coronary artery disease) 2008  ? Cataract   ? Complication  of anesthesia   ? hallucinated once  ? H/O hiatal hernia   ? Heart murmur   ? AFTER REPLACED VALVE WENT AWAY  ? Hx of colonic polyps   ? Hyperlipidemia   ? Hypertension   ? Thyroid disease   ? ? ?Past Surgical History:  ?Procedure Laterality Date  ? AORTIC VALVE REPLACEMENT  2009  ? Bioprosthetic at [place]  2008  ? CHOLECYSTECTOMY  1982  ? COLONOSCOPY    ? CORONARY ARTERY BYPASS GRAFT  2009  ? SVG-PDA w/ AVR  ? CORRECTION HAMMER TOE  2011  ? BILATERAL  ? GASTROCNEMIUS RECESSION  05/28/2011  ? POLYPECTOMY    ? TONSILLECTOMY    ? TOTAL HIP ARTHROPLASTY Left 2000  ? TOTAL KNEE ARTHROPLASTY  2000  ? left  ? TOTAL KNEE ARTHROPLASTY  12/01/2011  ? Procedure: TOTAL KNEE ARTHROPLASTY;  Surgeon: Mauri Pole, MD;  Location: [facility];  Service: Orthopedics;  Laterality: Right;  ? WEIL OSTEOTOMY  05/28/2011  ? ? ? ?Social History:  The patient  reports that he quit smoking about 47 years ago.  His smoking use included cigarettes. He has never used smokeless tobacco. He reports that he does not currently use alcohol. He reports that he does not use drugs.  ? ?Family History:  The patient's family history includes Colon cancer (age of onset: 80) in his mother; Heart attack in his brother and father; Hyperlipidemia in an other family member; Hypertension in his brother, sister, and another family member.  ? ? ?ROS:  Please see the history of present illness. All other systems are reviewed and  Negative to the above problem except as noted.  ? ? ?PHYSICAL EXAM: ?VS:  BP 138/70   Pulse 66   Ht _0  (1.803 m)   Wt 224 lb 3.2 oz (101.7 kg)   SpO2 92%   BMI 31.27 kg/m?   ?GEN: Obese 81 year-old, in no acute distress  ?HEENT: normal  ?Neck: no JVD,  ?Cardiac: RRR; Gr II/VI systolic murmur LSB left leg with 1+ edema LE  ?Respiratory:  clear to auscultation bilaterally, normal work of breathing ?GI: soft, nontender, nondistended, + BS  No hepatomegaly  ?MS: no deformity  Moving all extremities   ?Skin: warm and dry, no rash ?Neuro:  Strength and sensation are intact ?Psych: euthymic mood, full affect ? ? ?EKG:  EKG is not ordered today. ? ?[place]    01/09/20 ? ?Study Highlights ? ?1. NSR with sinus brady and sinus tachycardia ?2. Frequent PVC's and PAC's ?3. NS AT ?4. Rare triplets ?5. Bigeminal and trigeminal PVC's ?  ?Echo   01/09/20 ? ?1. Left ventricular ejection fraction, by estimation, is 55 to 60%. The left ventricle has normal ?function. The left ventricle demonstrates regional wall motion abnormalities (see scoring ?diagram/findings for description). The left ventricular internal cavity size was mildly dilated. ?Left ventricular diastolic parameters are consistent with Grade II diastolic dysfunction ?(pseudonormalization). ?2. Right ventricular systolic function is mildly reduced. The right ventricular size is mildly ?enlarged. There is normal pulmonary artery systolic pressure. The estimated right ventricular ?systolic pressure is 31.5 mmHg. ?3. The mitral valve is degenerative. Mild mitral valve regurgitation. The mean mitral valve ?gradient is 5.0 mmHg with average heart rate of 61 bpm in the setting of mild-moderate MAC. ?4. Aortic valve regurgitation is mild. There is a unknown size bioprosthetic valve present in the ?aortic position, LVOT diameter is 2.6 cm. Procedure Date: 2009. Aortic valve mean gradient ?measures 18.0 mmHg. Grossly stable appearance of aortic valve prosthesis compared to ?04/13/2018 exam. See findings for calculations. ?5. Aortic dilatation noted. There is mild dilatation of the ascending aorta, measuring 42 mm. ?6. The inferior vena cava is dilated in size with >50% respiratory variability, suggesting right ?atrial pressure of 8 mmHg. ?Comparison(s): 09/13/19 EF 60-65%. AV 20mHg mean PG. ?04/13/2018 EF 60-65%. AV 127mg mean PG. ? ?Echo 7.28.21 ? ?1. Left ventricular ejection fraction, by estimation, is 60 to 65%. The left ventricle has  normal ?function. There is moderate left ventricular hypertrophy. ?2. Right ventricular systolic function is normal. The right ventricular size is normal. There is ?moderately elevated pulmonary artery systolic pressure. ?3. Mild mitral valve regurgitation. ?4. Bioprosthetic aortic valve leaflets not well-visualized but appear thickened. The mean ?gradient has increased from 14.8 mmHg 03/2018 to 32.7 mmHg now. The aortic valve has been ?repaired/replaced. Aortic valve regurgitation is mild. Moderate aortic valve stenosis. There is ?a bioprosthetic valve present in the aortic position. Aortic valve area, by VTI measures 1.21 ?cm?. Marland Kitchenortic valve mean gradient measures 32.7 mmHg. Aortic valve Vmax measures 3.96 m/s. ?5. Aortic dilatation  noted. There is mild dilatation of the ascending aorta. ?6. The inferior vena cava is dilated in size with >50% respiratory variability, suggesting right ?atrial pressure of 8 mmHg. ?Lipid Panel ?   ?Component Value Date/Time  ? CHOL 150 12/18/2019 1005  ? TRIG 96 12/18/2019 1005  ? HDL 54 12/18/2019 1005  ? CHOLHDL 2.8 12/18/2019 1005  ? CHOLHDL 3 11/14/2015 0954  ? VLDL 16.8 11/14/2015 0954  ? [place] 78 12/18/2019 1005  ? ?  ? ?Wt Readings from Last 3 Encounters:  ?04/18/21 224 lb 3.2 oz (101.7 kg)  ?12/31/20 232 lb (105.2 kg)  ?09/05/20 226 lb (102.5 kg)  ?  ? ? ?ASSESSMENT AND PLAN: ?1.  Hx CAD. Pt is without symptoms to sugg ischemia   Follow   Get labs    ?Volume appears to be increased some   WIll get labs   ? ?2  AV dz  S/p AVR.   Last echo in 2021  Mean gradient 18 mm HG   Follow  ?. ?3.  Renal.  We will check labs today.  He had been followed by Dr. Moshe Cipro in at [facility] nephrology. ? ?4 Hx PVCs  Keep on amiodarone .   ? ?5 CV dz  Mild plaqing of carotids.   ? ?6.  Lipids.  Keep on current regimen.   ? ? ?Current medicines are reviewed at length with the patient today.  The patient does not have concerns regarding medicines. ? ?Signed, ?[name], MD  ?04/18/2021 11:51  AM    ?[facility] ?[address], [place], [place]  [address] ?Phone: [phone]; Fax: [phone]  ? ? ?

## 2021-04-18 ENCOUNTER — Ambulatory Visit: Payer: Medicare Other | Admitting: Internal Medicine

## 2021-04-18 ENCOUNTER — Encounter: Payer: Self-pay | Admitting: Internal Medicine

## 2021-04-18 ENCOUNTER — Other Ambulatory Visit: Payer: Self-pay

## 2021-04-18 VITALS — BP 138/70 | HR 66 | Ht 71.0 in | Wt 224.2 lb

## 2021-04-18 DIAGNOSIS — I10 Essential (primary) hypertension: Secondary | ICD-10-CM

## 2021-04-18 DIAGNOSIS — Z79899 Other long term (current) drug therapy: Secondary | ICD-10-CM

## 2021-04-18 DIAGNOSIS — I2510 Atherosclerotic heart disease of native coronary artery without angina pectoris: Secondary | ICD-10-CM | POA: Diagnosis not present

## 2021-04-18 NOTE — Patient Instructions (Addendum)
Medication Instructions:  ?Your physician recommends that you continue on your current medications as directed. Please refer to the Current Medication list given to you today. ? ?*If you need a refill on your cardiac medications before your next appointment, please call your pharmacy* ? ? ?Lab Work: ?Health visitor and pro bnp ? ?If you have labs (blood work) drawn today and your tests are completely normal, you will receive your results only by: ?MyChart Message (if you have MyChart) OR ?A paper copy in the mail ?If you have any lab test that is abnormal or we need to change your treatment, we will call you to review the results. ? ? ?Testing/Procedures: ?none ? ? ?Follow-Up: ?At [facility], you and your health needs are our priority.  As part of our continuing mission to provide you with exceptional heart care, we have created designated Provider Care Teams.  These Care Teams include your primary Cardiologist (physician) and Advanced Practice Providers (APPs -  Physician Assistants and Nurse Practitioners) who all work together to provide you with the care you need, when you need it. ? ?We recommend signing up for the patient portal called "MyChart".  Sign up information is provided on this After Visit Summary.  MyChart is used to connect with patients for Virtual Visits (Telemedicine).  Patients are able to view lab/test results, encounter notes, upcoming appointments, etc.  Non-urgent messages can be sent to your provider as well.   ?To learn more about what you can do with MyChart, go to [email or web address].   ? ?Your next appointment:   ?12 months  ? ?The format for your next appointment:   ?In Person ? ?Provider:   ?[name], MD   ? ? ?Other Instructions ?  ?

## 2021-04-19 LAB — COMPREHENSIVE METABOLIC PANEL
ALT: 38 IU/L (ref 0–44)
AST: 26 IU/L (ref 0–40)
Albumin/Globulin Ratio: 1.7 (ref 1.2–2.2)
Albumin: 4 g/dL (ref 3.7–4.7)
Alkaline Phosphatase: 109 IU/L (ref 44–121)
BUN/Creatinine Ratio: 24 (ref 10–24)
BUN: 27 mg/dL (ref 8–27)
Bilirubin Total: 0.7 mg/dL (ref 0.0–1.2)
CO2: 25 mmol/L (ref 20–29)
Calcium: 8.7 mg/dL (ref 8.6–10.2)
Chloride: 105 mmol/L (ref 96–106)
Creatinine, Ser: 1.12 mg/dL (ref 0.76–1.27)
Globulin, Total: 2.4 g/dL (ref 1.5–4.5)
Glucose: 82 mg/dL (ref 70–99)
Potassium: 4.2 mmol/L (ref 3.5–5.2)
Sodium: 143 mmol/L (ref 134–144)
Total Protein: 6.4 g/dL (ref 6.0–8.5)
eGFR: 67 mL/min/{1.73_m2} (ref >59)

## 2021-04-19 LAB — PRO B NATRIURETIC PEPTIDE: NT-Pro BNP: 5716 pg/mL — ABNORMAL HIGH (ref 0–486)

## 2021-04-21 ENCOUNTER — Encounter: Payer: Self-pay | Admitting: Internal Medicine

## 2021-04-21 DIAGNOSIS — Z79899 Other long term (current) drug therapy: Secondary | ICD-10-CM

## 2021-04-21 DIAGNOSIS — I2510 Atherosclerotic heart disease of native coronary artery without angina pectoris: Secondary | ICD-10-CM

## 2021-04-21 MED ORDER — FUROSEMIDE 40 MG PO TABS: ORAL_TABLET | ORAL | 3 refills | Status: DC

## 2021-04-21 MED ORDER — FUROSEMIDE 20 MG PO TABS: ORAL_TABLET | ORAL | 3 refills | Status: DC

## 2021-04-21 NOTE — Telephone Encounter (Signed)
-----   Message from Fay Records, MD sent at 04/21/2021  1:53 PM EST ----- ?FLuid number is elevated     I would recomm increasing furosemid to 60 mg alternating with 40 mg      (Take 60/40/60/ 40....)    ?Follow up BMET and BNP in 2 wks   ?

## 2021-04-21 NOTE — Telephone Encounter (Signed)
Spoke with the pt and resent RX for lasix to [facility] per his request... he wants the 20 mg tabs only since he does not want to cut the 40 mg in half. Labs 05/06/21.  ?

## 2021-04-22 ENCOUNTER — Other Ambulatory Visit: Payer: Self-pay | Admitting: Internal Medicine

## 2021-04-23 DIAGNOSIS — M25561 Pain in right knee: Secondary | ICD-10-CM | POA: Diagnosis not present

## 2021-04-23 NOTE — Telephone Encounter (Signed)
Pt's pharmacy is requesting a refill on levothyroxine 50 mcg. Would Dr. Harrington Challenger like to refill this medication? Please address ?

## 2021-04-28 ENCOUNTER — Telehealth: Payer: Self-pay

## 2021-04-28 DIAGNOSIS — L814 Other melanin hyperpigmentation: Secondary | ICD-10-CM | POA: Diagnosis not present

## 2021-04-28 DIAGNOSIS — B029 Zoster without complications: Secondary | ICD-10-CM | POA: Diagnosis not present

## 2021-04-28 DIAGNOSIS — L821 Other seborrheic keratosis: Secondary | ICD-10-CM | POA: Diagnosis not present

## 2021-04-28 DIAGNOSIS — C4441 Basal cell carcinoma of skin of scalp and neck: Secondary | ICD-10-CM | POA: Diagnosis not present

## 2021-04-28 DIAGNOSIS — Z85828 Personal history of other malignant neoplasm of skin: Secondary | ICD-10-CM | POA: Diagnosis not present

## 2021-04-28 DIAGNOSIS — D1801 Hemangioma of skin and subcutaneous tissue: Secondary | ICD-10-CM | POA: Diagnosis not present

## 2021-04-28 DIAGNOSIS — L570 Actinic keratosis: Secondary | ICD-10-CM | POA: Diagnosis not present

## 2021-04-28 DIAGNOSIS — R0683 Snoring: Secondary | ICD-10-CM

## 2021-04-28 NOTE — Telephone Encounter (Signed)
-----   Message from Fay Records, MD sent at 04/25/2021  1:09 AM EST ----- ?Not sure if I mentioned reeval for sleep apnea ?

## 2021-04-28 NOTE — Telephone Encounter (Signed)
I spoke with the pt and he agreed... referral placed.  ?

## 2021-04-29 DIAGNOSIS — H00014 Hordeolum externum left upper eyelid: Secondary | ICD-10-CM | POA: Diagnosis not present

## 2021-04-30 ENCOUNTER — Encounter: Payer: Self-pay | Admitting: Internal Medicine

## 2021-05-02 NOTE — Addendum Note (Signed)
Addended by: Stephani Police on: 05/02/2021 08:05 AM ? ? Modules accepted: Orders ? ?

## 2021-05-06 ENCOUNTER — Other Ambulatory Visit: Payer: Self-pay

## 2021-05-06 ENCOUNTER — Other Ambulatory Visit: Payer: Medicare Other | Admitting: *Deleted

## 2021-05-06 DIAGNOSIS — I2510 Atherosclerotic heart disease of native coronary artery without angina pectoris: Secondary | ICD-10-CM | POA: Diagnosis not present

## 2021-05-06 DIAGNOSIS — Z79899 Other long term (current) drug therapy: Secondary | ICD-10-CM

## 2021-05-07 ENCOUNTER — Telehealth: Payer: Self-pay

## 2021-05-07 DIAGNOSIS — I10 Essential (primary) hypertension: Secondary | ICD-10-CM

## 2021-05-07 DIAGNOSIS — Z79899 Other long term (current) drug therapy: Secondary | ICD-10-CM

## 2021-05-07 LAB — BASIC METABOLIC PANEL
BUN/Creatinine Ratio: 26 — ABNORMAL HIGH (ref 10–24)
BUN: 28 mg/dL — ABNORMAL HIGH (ref 8–27)
CO2: 26 mmol/L (ref 20–29)
Calcium: 9 mg/dL (ref 8.6–10.2)
Chloride: 103 mmol/L (ref 96–106)
Creatinine, Ser: 1.06 mg/dL (ref 0.76–1.27)
Glucose: 65 mg/dL — ABNORMAL LOW (ref 70–99)
Potassium: 3.9 mmol/L (ref 3.5–5.2)
Sodium: 142 mmol/L (ref 134–144)
eGFR: 71 mL/min/{1.73_m2} (ref >59)

## 2021-05-07 LAB — PRO B NATRIURETIC PEPTIDE: NT-Pro BNP: 3682 pg/mL — ABNORMAL HIGH (ref 0–486)

## 2021-05-07 NOTE — Telephone Encounter (Signed)
Pt to have repeat labs 05/27/21.  ?

## 2021-05-07 NOTE — Telephone Encounter (Signed)
-----   Message from Fay Records, MD sent at 05/07/2021  2:02 PM EDT ----- ?Fluid number is up   But improving from previous    ?Kidney function and electrolytes are OK ?I would repeat in 3 wks to make sure still OK    and that fluid better  ?

## 2021-05-08 ENCOUNTER — Encounter: Payer: Self-pay | Admitting: Internal Medicine

## 2021-05-09 DIAGNOSIS — R3914 Feeling of incomplete bladder emptying: Secondary | ICD-10-CM | POA: Diagnosis not present

## 2021-05-09 DIAGNOSIS — R3915 Urgency of urination: Secondary | ICD-10-CM | POA: Diagnosis not present

## 2021-05-09 DIAGNOSIS — R350 Frequency of micturition: Secondary | ICD-10-CM | POA: Diagnosis not present

## 2021-05-12 ENCOUNTER — Telehealth: Payer: Self-pay | Admitting: *Deleted

## 2021-05-12 NOTE — Telephone Encounter (Signed)
Informed patient of upcoming home sleep study and patient understanding was verbalized.  ?Patient understands her/his HST is scheduled for 06/24/21 at 12. ?Pt is aware of testing date.  ?

## 2021-05-19 ENCOUNTER — Encounter: Payer: Self-pay | Admitting: Infectious Diseases

## 2021-05-19 ENCOUNTER — Other Ambulatory Visit: Payer: Self-pay

## 2021-05-19 ENCOUNTER — Ambulatory Visit (INDEPENDENT_AMBULATORY_CARE_PROVIDER_SITE_OTHER): Payer: Medicare Other | Admitting: Infectious Diseases

## 2021-05-19 VITALS — BP 99/68 | HR 94 | Resp 16 | Ht 71.0 in | Wt 224.2 lb

## 2021-05-19 DIAGNOSIS — H00024 Hordeolum internum left upper eyelid: Secondary | ICD-10-CM | POA: Diagnosis not present

## 2021-05-19 DIAGNOSIS — N390 Urinary tract infection, site not specified: Secondary | ICD-10-CM | POA: Diagnosis not present

## 2021-05-19 DIAGNOSIS — Z8739 Personal history of other diseases of the musculoskeletal system and connective tissue: Secondary | ICD-10-CM

## 2021-05-19 DIAGNOSIS — R600 Localized edema: Secondary | ICD-10-CM

## 2021-05-19 NOTE — Assessment & Plan Note (Addendum)
He has required treatment for 2 bladder infections over the last 7 months. One episode he clearly had dysuria symptoms and discomfort, the second it is hard to say if he was symptomatic for. He did have resolution of dysuria in October with antibiotics. Working with urology now and started on flomax. Discussed frequent and more efficient bladder emptying will be helpful to prevent infections. No great supportive data for cranberry or probiotics.  ?He is not diabetic fortunately. I understand their desire to ensure all infections are treated well to prevent superinfection of joint or TAVR valve.  ?No great role with preventative antibiotics with urinary organisms given propensity for emergence of resistance, and I don't think he has had a frequent enough problem to warrantconsidering that either.  ?FU with Urology arranged.  ?

## 2021-05-19 NOTE — Assessment & Plan Note (Addendum)
Continues to do very well now 15 months off antibiotics. No concerning signs of new infection or relapsed infection fortunately.  ?

## 2021-05-19 NOTE — Progress Notes (Signed)
? ?  ?Patient: Luis Herrera  ?DOB: 16-Jan-1941 ?MRN: [id number] ?PCP: [name], MD  ? ? ? ?Subjective:  ? ?Chief Complaint  ?Patient presents with  ? Follow-up  ?  ? ? ?Luis Herrera is a 81 y.o. male previously treated for culture left prosthetic knee infection. ? ?Brief Hx - ?Went to [facility] health after undergoing revision of left total knee arthroplasty 09/01/2019 with Dr. Tessie Fass.  Original implant in 2013. Also of note he has a bioprosthetic aortic valve replacement as well. Early July - arthrocentesis with WBC 27,000 84% PMNs. No growth on plate.  Due to concern over infection he was referred to Dr. Tessie Fass for further management. No fevers, chills or malaise.  Operative report reviewed - underwent revision of the tibial component with wound VAC application. Intraoperative cultures did not yield any growth of an organism. Course was complicated by a hematoma involving suprapatella space.  He was started empirically on vancomycin and cefepime --> changed to Vancomycin and Ceftriaxone. Developed AKI on Vancomycin requiring hospitalization at [facility] >> switched to Ceftriaxone IV and PO doxy. He continued on PO doxycycline for ongoing treatment to complete 6 months and stopped all antibiotics in January 2022.  ? ? ? ?HPI: ?LOV about a year ago - He says his knee continues to do well without any concerns for relapsing infection. It does 'catch' periodically but aside from that no complaints on the Lt knee. Right knee he had an episode of bursitis and actually a outbreak of shingles recently but he is improved from this standpoint as well.  ? ?He and his daughter are here today to discuss recurrent infections that he has noticed lately. Mainly recurrent sty's in the left eye and UTIs.  ? 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 Most currently the last was 2-3 weeks ago. He has had a round of eye gtts and oral antibiotic not long ago to help with this treatment. Does not think he is putting his  hands in his eyes much but could be possible with allergies contributing. Does have some dry eyes with increased tearing effect so finds he may be wiping them often.  ? ?He has also had a few UTIs. Previously had 2 prior to knee infection that he did not have any symptoms for. Last UTI he definitely had symptoms of burning and pain with urinating. He was treated with 7d course of augmentin in October from what I can tell.  Working with urology now. Started on flomax and hopeful this will help prevent stagnation in urine.  Main concern is making sure we can help him do everything that we can to prevent any recurrent infection in any prosthetic device (including TAVR valve). He does not recall any resistant bacteria to his knowledge. Had a urine test last week with urology and maybe had rare bacteria but no growth on culture to his knowledge. No symptoms to declare. He does notice a strong odor from urine in the morning occasionally.  ? ?Has been working with cardiology recently trying to figure lower his BNP and adjusting his diuretic doses.  ? ? ? ?Review of Systems  ?Constitutional:  Negative for chills and fever.  ?HENT:  Negative for tinnitus.   ?Eyes:  Negative for blurred vision and photophobia.  ?Respiratory:  Negative for cough and sputum production.   ?Cardiovascular:  Negative for chest pain.  ?Gastrointestinal:  Negative for diarrhea, nausea and vomiting.  ?Genitourinary:  Negative for dysuria.  ?Musculoskeletal:  Negative for joint pain.  ?Skin:  Negative for rash.  ?Neurological:  Negative for headaches.  ? ? ?Past Medical History:  ?Diagnosis Date  ? Aortic stenosis 2008  ? Arthritis   ? Blood transfusion   ? Blood transfusion without reported diagnosis   ? CAD (coronary artery disease) 2008  ? Cataract   ? Complication of anesthesia   ? hallucinated once  ? H/O hiatal hernia   ? Heart murmur   ? AFTER REPLACED VALVE WENT AWAY  ? Hx of colonic polyps   ? Hyperlipidemia   ? Hypertension   ? Thyroid  disease   ? ? ?Outpatient Medications Prior to Visit  ?Medication Sig Dispense Refill  ? acetaminophen (TYLENOL) 500 MG tablet Take 1,000 mg by mouth as needed for moderate pain.    ? amiodarone (PACERONE) 200 MG tablet TAKE ONE-HALF TABLET BY MOUTH  DAILY 45 tablet 3  ? amLODipine-olmesartan (AZOR) 5-20 MG tablet Take 1 tablet by mouth daily. 90 tablet 0  ? aspirin EC 81 MG tablet Take 1 tablet (81 mg total) by mouth daily. Swallow whole. 90 tablet 3  ? atorvastatin (LIPITOR) 80 MG tablet TAKE 1 TABLET BY MOUTH ONCE  DAILY 90 tablet 3  ? carvedilol (COREG) 6.25 MG tablet TAKE 1 TABLET BY MOUTH  TWICE DAILY 120 tablet 5  ? ezetimibe (ZETIA) 10 MG tablet TAKE 1 TABLET BY MOUTH DAILY 90 tablet 3  ? furosemide (LASIX) 20 MG tablet Take 2 tablets ('40mg'$ ) by mouth every other day, alternating with 3 tablets (60 mg) on the opposite days. 90 tablet 3  ? levothyroxine (SYNTHROID) 50 MCG tablet TAKE 1 TABLET BY MOUTH DAILY  BEFORE BREAKFAST 90 tablet 3  ? Multiple Vitamin (MULTIVITAMIN WITH MINERALS) TABS Take 1 tablet by mouth daily.    ? potassium chloride SA (KLOR-CON) 20 MEQ tablet Take 3 tablets the first day, then take one tablet daily 90 tablet 3  ? tamsulosin (FLOMAX) 0.4 MG CAPS capsule Take 0.4 mg by mouth.    ? doxycycline (VIBRA-TABS) 100 MG tablet Take 100 mg by mouth 2 (two) times daily. (Patient not taking: Reported on 05/19/2021)    ? ?No facility-administered medications prior to visit.  ?  ? ?Allergies  ?Allergen Reactions  ? Sulfamethoxazole-Trimethoprim Other (See Comments)  ?  Carlyn Reichert Syndrome  ? Sulfa Antibiotics Other (See Comments)  ? Sulfasalazine Other (See Comments)  ? ? ?Social History  ? ?Tobacco Use  ? Smoking status: Former  ?  Types: Cigarettes  ?  Quit date: 05/25/1973  ?  Years since quitting: 48.0  ? Smokeless tobacco: Never  ?Vaping Use  ? Vaping Use: Never used  ?Substance Use Topics  ? Alcohol use: Not Currently  ?  Comment: rare  ? Drug use: No  ? ? ?Objective:  ? ?Vitals:  ?  05/19/21 0942  ?BP: 99/68  ?Pulse: 94  ?Resp: 16  ?SpO2: 97%  ?Weight: 224 lb 3.2 oz (101.7 kg)  ?Height: '5\' 11"'$  (1.803 m)  ? ?Body mass index is 31.27 kg/m?. ? ?Physical Exam ?HENT:  ?   Mouth/Throat:  ?   Mouth: No oral lesions.  ?   Dentition: Normal dentition. No dental caries.  ?Eyes:  ?   General: No scleral icterus. ?Cardiovascular:  ?   Rate and Rhythm: Normal rate and regular rhythm.  ?   Heart sounds: Normal heart sounds.  ?Pulmonary:  ?   Effort: Pulmonary effort is normal.  ?   Breath sounds:  Normal breath sounds.  ?Abdominal:  ?   General: There is no distension.  ?   Palpations: Abdomen is soft.  ?   Tenderness: There is no abdominal tenderness.  ?Lymphadenopathy:  ?   Cervical: No cervical adenopathy.  ?Skin: ?   General: Skin is warm and dry.  ?   Findings: No rash.  ?Neurological:  ?   Mental Status: He is alert and oriented to person, place, and time.  ? ? ? ?Lab Results: ?Lab Results  ?Component Value Date  ? WBC 8.1 12/31/2020  ? HGB 12.2 (L) 12/31/2020  ? HCT 36.7 (L) 12/31/2020  ? MCV 92 12/31/2020  ? PLT 173 12/31/2020  ?  ?Lab Results  ?Component Value Date  ? CREATININE 1.06 05/06/2021  ? BUN 28 (H) 05/06/2021  ? NA 142 05/06/2021  ? K 3.9 05/06/2021  ? CL 103 05/06/2021  ? CO2 26 05/06/2021  ?  ?Lab Results  ?Component Value Date  ? ALT 38 04/18/2021  ? AST 26 04/18/2021  ? ALKPHOS 109 04/18/2021  ? BILITOT 0.7 04/18/2021  ?  ? ?Assessment & Plan:  ? ?Patient Active Problem List  ? Diagnosis Date Noted  ? Sty, internal 05/21/2021  ? Dizziness 12/05/2019  ? 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 Hypokalemia 10/17/2019  ? Lower extremity edema 10/17/2019  ? Symptomatic anemia 09/12/2019  ? Anemia 09/12/2019  ? Chest pain 04/13/2018  ? Frequent UTI 04/13/2018  ? Hypothyroidism 04/13/2018  ? Aortic valve stenosis 09/27/2017  ? Right bundle branch block 09/27/2017  ? CAD (coronary artery disease) 09/27/2017  ? 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 Acquired claw toe of  right foot 06/04/2016  ? Class 1 obesity with body mass index (BMI) of 31.0 to 31.9 in adult 12/02/2011  ? 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 Back pain of thoracolumbar region 09/01/2010  ? Hyperlip

## 2021-05-19 NOTE — Patient Instructions (Addendum)
For the recurrent bladder infections I am hopeful that the flomax will help prevent the urine from "staying put" too long in your bladder.  ? ?Make sure you empty fully before bed every night. Find the balance when your BNP looks better of keeping adequate fluid in you.  ?  ?

## 2021-05-20 DIAGNOSIS — H1045 Other chronic allergic conjunctivitis: Secondary | ICD-10-CM | POA: Diagnosis not present

## 2021-05-20 DIAGNOSIS — H01014 Ulcerative blepharitis left upper eyelid: Secondary | ICD-10-CM | POA: Diagnosis not present

## 2021-05-21 DIAGNOSIS — H00029 Hordeolum internum unspecified eye, unspecified eyelid: Secondary | ICD-10-CM | POA: Insufficient documentation

## 2021-05-21 NOTE — Assessment & Plan Note (Signed)
Suspect that with either allergy symptoms or dry eyes he is inadvertently putting his hands in his eyes. Suggested OTC allergy reducing medication to prevent irritation and try best to keep hands from contacting with eyes to avoid inoculation of bacteria here.  ?He is improving with current treatment and has FU with ophthalmology soon.  ?

## 2021-05-23 DIAGNOSIS — I739 Peripheral vascular disease, unspecified: Secondary | ICD-10-CM | POA: Diagnosis not present

## 2021-05-27 ENCOUNTER — Other Ambulatory Visit: Payer: Medicare Other | Admitting: *Deleted

## 2021-05-27 DIAGNOSIS — I10 Essential (primary) hypertension: Secondary | ICD-10-CM

## 2021-05-27 DIAGNOSIS — Z79899 Other long term (current) drug therapy: Secondary | ICD-10-CM | POA: Diagnosis not present

## 2021-05-28 ENCOUNTER — Encounter: Payer: Self-pay | Admitting: Internal Medicine

## 2021-05-28 LAB — BASIC METABOLIC PANEL
BUN/Creatinine Ratio: 27 — ABNORMAL HIGH (ref 10–24)
BUN: 28 mg/dL — ABNORMAL HIGH (ref 8–27)
CO2: 26 mmol/L (ref 20–29)
Calcium: 9 mg/dL (ref 8.6–10.2)
Chloride: 102 mmol/L (ref 96–106)
Creatinine, Ser: 1.05 mg/dL (ref 0.76–1.27)
Glucose: 148 mg/dL — ABNORMAL HIGH (ref 70–99)
Potassium: 4.2 mmol/L (ref 3.5–5.2)
Sodium: 140 mmol/L (ref 134–144)
eGFR: 72 mL/min/{1.73_m2} (ref >59)

## 2021-05-28 LAB — PRO B NATRIURETIC PEPTIDE: NT-Pro BNP: 2176 pg/mL — ABNORMAL HIGH (ref 0–486)

## 2021-05-29 ENCOUNTER — Telehealth: Payer: Self-pay | Admitting: Family Medicine

## 2021-05-29 ENCOUNTER — Encounter: Payer: Self-pay | Admitting: Internal Medicine

## 2021-05-29 NOTE — Telephone Encounter (Signed)
Spoke to patient to schedule Medicare Annual Wellness Visit (AWV) either virtually or in office. Left  my jabber number [phone] ? ?He stated he will call me back  ? ? ? awvs per [facility] 08/17/11  ?please schedule at anytime with [facility] Nurse Health Advisor 1 or 2 ? ? ?This should be a 45 minute visit.  ?

## 2021-06-02 ENCOUNTER — Encounter: Payer: Self-pay | Admitting: Internal Medicine

## 2021-06-02 DIAGNOSIS — J9859 Other diseases of mediastinum, not elsewhere classified: Secondary | ICD-10-CM

## 2021-06-02 DIAGNOSIS — Z79899 Other long term (current) drug therapy: Secondary | ICD-10-CM

## 2021-06-02 DIAGNOSIS — I10 Essential (primary) hypertension: Secondary | ICD-10-CM

## 2021-06-03 ENCOUNTER — Other Ambulatory Visit: Payer: Self-pay | Admitting: Internal Medicine

## 2021-06-17 ENCOUNTER — Other Ambulatory Visit: Payer: Self-pay | Admitting: *Deleted

## 2021-06-17 ENCOUNTER — Other Ambulatory Visit: Payer: Medicare Other | Admitting: *Deleted

## 2021-06-17 DIAGNOSIS — I10 Essential (primary) hypertension: Secondary | ICD-10-CM | POA: Diagnosis not present

## 2021-06-17 DIAGNOSIS — Z79899 Other long term (current) drug therapy: Secondary | ICD-10-CM

## 2021-06-17 LAB — BASIC METABOLIC PANEL
BUN/Creatinine Ratio: 23 (ref 10–24)
BUN: 28 mg/dL — ABNORMAL HIGH (ref 8–27)
CO2: 27 mmol/L (ref 20–29)
Calcium: 8.9 mg/dL (ref 8.6–10.2)
Chloride: 105 mmol/L (ref 96–106)
Creatinine, Ser: 1.22 mg/dL (ref 0.76–1.27)
Glucose: 96 mg/dL (ref 70–99)
Potassium: 4.3 mmol/L (ref 3.5–5.2)
Sodium: 143 mmol/L (ref 134–144)
eGFR: 60 mL/min/{1.73_m2} (ref >59)

## 2021-06-17 LAB — CBC
Hematocrit: 35.2 % — ABNORMAL LOW (ref 37.5–51.0)
Hemoglobin: 11.7 g/dL — ABNORMAL LOW (ref 13.0–17.7)
MCH: 31 pg (ref 26.6–33.0)
MCHC: 33.2 g/dL (ref 31.5–35.7)
MCV: 93 fL (ref 79–97)
Platelets: 160 10*3/uL (ref 150–450)
RBC: 3.77 x10E6/uL — ABNORMAL LOW (ref 4.14–5.80)
RDW: 14 % (ref 11.6–15.4)
WBC: 7.5 10*3/uL (ref 3.4–10.8)

## 2021-06-18 LAB — PRO B NATRIURETIC PEPTIDE: NT-Pro BNP: 1852 pg/mL — ABNORMAL HIGH (ref 0–486)

## 2021-06-22 ENCOUNTER — Other Ambulatory Visit: Payer: Self-pay | Admitting: Internal Medicine

## 2021-06-23 ENCOUNTER — Encounter: Payer: Self-pay | Admitting: Internal Medicine

## 2021-06-23 DIAGNOSIS — Z79899 Other long term (current) drug therapy: Secondary | ICD-10-CM

## 2021-06-23 DIAGNOSIS — I2510 Atherosclerotic heart disease of native coronary artery without angina pectoris: Secondary | ICD-10-CM

## 2021-06-23 DIAGNOSIS — I10 Essential (primary) hypertension: Secondary | ICD-10-CM

## 2021-06-23 MED ORDER — AMLODIPINE BESYLATE 5 MG PO TABS: 5.0000 mg | ORAL_TABLET | Freq: Every day | ORAL | 3 refills | Status: DC

## 2021-06-23 MED ORDER — ENTRESTO 49-51 MG PO TABS: 1.0000 | ORAL_TABLET | Freq: Two times a day (BID) | ORAL | 3 refills | Status: DC

## 2021-06-23 NOTE — Telephone Encounter (Signed)
Patient sent a Mychart message via patient schedule stating "Luis Herrera I forgot to ask another question can you call me back. Thanks" Please advise.  ?

## 2021-06-23 NOTE — Telephone Encounter (Signed)
-----   Message from Thayer Headings, MD sent at 06/19/2021  9:39 AM EDT ----- ?LV systolic function was normal by echo in 2021. ?Grade II DD ? ?Pro-BNP is improved but is still elevated ?Creatinine is up slightly  ?Pt may benefit from Entresto instead of Olmesartan ?DC Azor ?Start amlodipine 5 mg a day  ?Start entresto 49-51 BID ?BMP in 3 weeks ( results to go to Dr  Harrington Challenger) ?Follow up with Dr. Harrington Challenger  ? ?

## 2021-06-23 NOTE — Telephone Encounter (Signed)
I spoke with the pt and he verbalized understanding of his med changes... he will have repeat labs 07/15/21.  ?

## 2021-06-23 NOTE — Telephone Encounter (Addendum)
Pt asking of he needs to alter his Lasix in re: to his recent BNP and creat.. will call him after Dr Harrington Challenger reviews.  ?

## 2021-06-24 ENCOUNTER — Encounter (HOSPITAL_BASED_OUTPATIENT_CLINIC_OR_DEPARTMENT_OTHER): Payer: Medicare Other | Admitting: Cardiology

## 2021-06-24 ENCOUNTER — Encounter: Payer: Self-pay | Admitting: Internal Medicine

## 2021-06-24 NOTE — Telephone Encounter (Signed)
I would not change lasix given other changes  ?Add BNP to labs  ?

## 2021-07-01 DIAGNOSIS — H353131 Nonexudative age-related macular degeneration, bilateral, early dry stage: Secondary | ICD-10-CM | POA: Diagnosis not present

## 2021-07-01 DIAGNOSIS — H40013 Open angle with borderline findings, low risk, bilateral: Secondary | ICD-10-CM | POA: Diagnosis not present

## 2021-07-15 ENCOUNTER — Other Ambulatory Visit: Payer: Medicare Other | Admitting: *Deleted

## 2021-07-15 DIAGNOSIS — I2510 Atherosclerotic heart disease of native coronary artery without angina pectoris: Secondary | ICD-10-CM | POA: Diagnosis not present

## 2021-07-15 DIAGNOSIS — I10 Essential (primary) hypertension: Secondary | ICD-10-CM | POA: Diagnosis not present

## 2021-07-15 DIAGNOSIS — Z79899 Other long term (current) drug therapy: Secondary | ICD-10-CM | POA: Diagnosis not present

## 2021-07-18 ENCOUNTER — Encounter: Payer: Self-pay | Admitting: Internal Medicine

## 2021-07-18 NOTE — Telephone Encounter (Signed)
Informed patient of results and verbal understanding expressed. Aware that BNP still not resulted and awaiting that. Pt understands.

## 2021-07-21 ENCOUNTER — Encounter: Payer: Self-pay | Admitting: Internal Medicine

## 2021-07-21 ENCOUNTER — Telehealth: Payer: Self-pay | Admitting: Family Medicine

## 2021-07-21 DIAGNOSIS — Z79899 Other long term (current) drug therapy: Secondary | ICD-10-CM

## 2021-07-21 DIAGNOSIS — D649 Anemia, unspecified: Secondary | ICD-10-CM

## 2021-07-21 DIAGNOSIS — I10 Essential (primary) hypertension: Secondary | ICD-10-CM

## 2021-07-21 NOTE — Telephone Encounter (Signed)
I spoke with patient.  He is asking about results of BNP.  I checked with supervisor in the office and call was made this past Friday to check on delay and when results would be available.  Currently waiting to hear back on this. I made patient aware that this is being looked into and he will be called with results when they are available. Patient reports he is aware of BMP results.  He reports his ankles are fine and he is feeling good.

## 2021-07-21 NOTE — Telephone Encounter (Signed)
Spoke with patient to schedule Medicare Annual Wellness Visit (AWV) either virtually or in office. Left  my Herbie Drape number [phone]  Patient stated he has a busy June and will call back in July to schedule    awvs per [facility] 08/17/11   please schedule at anytime with [facility] Nurse Health Advisor 1 or 2

## 2021-07-28 ENCOUNTER — Encounter: Payer: Self-pay | Admitting: Internal Medicine

## 2021-07-28 MED ORDER — ENTRESTO 49-51 MG PO TABS: 1.0000 | ORAL_TABLET | Freq: Two times a day (BID) | ORAL | 3 refills | Status: DC

## 2021-07-29 ENCOUNTER — Telehealth: Payer: Self-pay | Admitting: Internal Medicine

## 2021-07-29 DIAGNOSIS — I70203 Unspecified atherosclerosis of native arteries of extremities, bilateral legs: Secondary | ICD-10-CM | POA: Diagnosis not present

## 2021-07-29 NOTE — Telephone Encounter (Signed)
Left Luis Herrera at [facility] a message to call the office back for further assistance.  Operators, please confirm the correct extension number for Luis Herrera, for extension previously endorsed is incorrect and not a working extension for her.

## 2021-07-29 NOTE — Telephone Encounter (Signed)
Leilani with [facility] calling to see if the a corrective lab report for a lab draw date of 5/30, was received. [phone]

## 2021-07-30 LAB — PRO B NATRIURETIC PEPTIDE

## 2021-07-30 LAB — BASIC METABOLIC PANEL
BUN/Creatinine Ratio: 25 — ABNORMAL HIGH (ref 10–24)
BUN: 29 mg/dL — ABNORMAL HIGH (ref 8–27)
CO2: 23 mmol/L (ref 20–29)
Calcium: 8.5 mg/dL — ABNORMAL LOW (ref 8.6–10.2)
Chloride: 109 mmol/L — ABNORMAL HIGH (ref 96–106)
Creatinine, Ser: 1.14 mg/dL (ref 0.76–1.27)
Glucose: 132 mg/dL — ABNORMAL HIGH (ref 70–99)
Potassium: 4 mmol/L (ref 3.5–5.2)
Sodium: 145 mmol/L — ABNORMAL HIGH (ref 134–144)
eGFR: 65 mL/min/{1.73_m2} (ref >59)

## 2021-07-30 NOTE — Telephone Encounter (Signed)
I spoke with Luis Herrera and and she advised that the pts last BNP result from 07/15/21 was ran and possibly resulted at some point since has been taken out of the system and cancelled to disregard any result from that date.   Pt already has planned to repeat in a few weeks.

## 2021-08-26 DIAGNOSIS — M19071 Primary osteoarthritis, right ankle and foot: Secondary | ICD-10-CM | POA: Diagnosis not present

## 2021-08-27 ENCOUNTER — Telehealth: Payer: Self-pay | Admitting: Family Medicine

## 2021-08-27 NOTE — Telephone Encounter (Signed)
Left message for patient to call back and schedule Medicare Annual Wellness Visit (AWV) either virtually or in office. Left  my Herbie Drape number [phone]   awvs per [place] 08/17/11  please schedule at anytime with [facility] Nurse Health Advisor 1 or 2

## 2021-08-28 ENCOUNTER — Encounter: Payer: Self-pay | Admitting: Internal Medicine

## 2021-08-28 NOTE — Telephone Encounter (Signed)
Pt is asking to have a HGB added on for his planned labs next week.... his last CBC was 06/17/21 with HGB 11.7.    I will forward to Dr Harrington Challenger to be sure okay to order.

## 2021-08-28 NOTE — Telephone Encounter (Signed)
Yes, check CBC  Looks like he has several labs, including a CBC that are set to be drawn  Do them all

## 2021-09-01 DIAGNOSIS — R350 Frequency of micturition: Secondary | ICD-10-CM | POA: Diagnosis not present

## 2021-09-01 DIAGNOSIS — R3915 Urgency of urination: Secondary | ICD-10-CM | POA: Diagnosis not present

## 2021-09-01 DIAGNOSIS — N3000 Acute cystitis without hematuria: Secondary | ICD-10-CM | POA: Diagnosis not present

## 2021-09-01 DIAGNOSIS — R3914 Feeling of incomplete bladder emptying: Secondary | ICD-10-CM | POA: Diagnosis not present

## 2021-09-01 DIAGNOSIS — R3121 Asymptomatic microscopic hematuria: Secondary | ICD-10-CM | POA: Diagnosis not present

## 2021-09-02 ENCOUNTER — Other Ambulatory Visit: Payer: Medicare Other

## 2021-09-02 DIAGNOSIS — Z79899 Other long term (current) drug therapy: Secondary | ICD-10-CM

## 2021-09-02 DIAGNOSIS — I10 Essential (primary) hypertension: Secondary | ICD-10-CM | POA: Diagnosis not present

## 2021-09-02 DIAGNOSIS — D649 Anemia, unspecified: Secondary | ICD-10-CM | POA: Diagnosis not present

## 2021-09-03 ENCOUNTER — Telehealth: Payer: Self-pay | Admitting: Internal Medicine

## 2021-09-03 ENCOUNTER — Encounter: Payer: Self-pay | Admitting: Internal Medicine

## 2021-09-03 LAB — NMR, LIPOPROFILE
Cholesterol, Total: 146 mg/dL (ref 100–199)
HDL Particle Number: 29.7 umol/L — ABNORMAL LOW (ref >=30.5)
HDL-C: 58 mg/dL (ref >39)
LDL Particle Number: 850 nmol/L (ref <1000)
LDL Size: 20.8 nm (ref >20.5)
LDL-C (NIH Calc): 72 mg/dL (ref 0–99)
LP-IR Score: 29 (ref <=45)
Small LDL Particle Number: 367 nmol/L (ref <=527)
Triglycerides: 87 mg/dL (ref 0–149)

## 2021-09-03 LAB — BASIC METABOLIC PANEL
BUN/Creatinine Ratio: 26 — ABNORMAL HIGH (ref 10–24)
BUN: 31 mg/dL — ABNORMAL HIGH (ref 8–27)
CO2: 23 mmol/L (ref 20–29)
Calcium: 8.5 mg/dL — ABNORMAL LOW (ref 8.6–10.2)
Chloride: 104 mmol/L (ref 96–106)
Creatinine, Ser: 1.17 mg/dL (ref 0.76–1.27)
Glucose: 94 mg/dL (ref 70–99)
Potassium: 4 mmol/L (ref 3.5–5.2)
Sodium: 141 mmol/L (ref 134–144)
eGFR: 63 mL/min/{1.73_m2} (ref >59)

## 2021-09-03 LAB — PRO B NATRIURETIC PEPTIDE: NT-Pro BNP: 3503 pg/mL — ABNORMAL HIGH (ref 0–486)

## 2021-09-03 LAB — CBC
Hematocrit: 35.9 % — ABNORMAL LOW (ref 37.5–51.0)
Hemoglobin: 11.6 g/dL — ABNORMAL LOW (ref 13.0–17.7)
MCH: 30.9 pg (ref 26.6–33.0)
MCHC: 32.3 g/dL (ref 31.5–35.7)
MCV: 96 fL (ref 79–97)
Platelets: 156 10*3/uL (ref 150–450)
RBC: 3.76 x10E6/uL — ABNORMAL LOW (ref 4.14–5.80)
RDW: 12.6 % (ref 11.6–15.4)
WBC: 15.1 10*3/uL — ABNORMAL HIGH (ref 3.4–10.8)

## 2021-09-03 NOTE — Telephone Encounter (Signed)
Reviewed echo findings with daughter    Severe MS  Severe annular calcification.   I have reviewed with interventional service Not a surgical candidate.   Could send study to [facility] in [place] for consideration of transcutaneous replacement (off label)  Daughter said they had a relative who went in for TAVR   Died on table   Pt does not want that       Currently she says her mother is doing OK   Takes activity as tolerated   No dizziness  No CP  Will call if symtpoms change  Plan follow up in the fall (End of October)

## 2021-09-04 ENCOUNTER — Encounter: Payer: Self-pay | Admitting: Internal Medicine

## 2021-09-04 MED ORDER — FUROSEMIDE 20 MG PO TABS: ORAL_TABLET | ORAL | 2 refills | Status: DC

## 2021-09-04 NOTE — Telephone Encounter (Addendum)
Spoke to pt. Aware Dr. Harrington Challenger and her nurse are not in the office remainder of week. Informed that Dr. Harrington Challenger spoke w/ his dtr yesterday and pt adamant she did not speak w/ his dtr. Pt concerned about BNP result and informed that MD has not reviewed yet.   Anxious that this is worsening on the new medication Entresto. Offered to take to DOD but pt adamant that he only  speak with Dr. Lovena Le or Harrington Challenger. Aware Dr. Harrington Challenger is only back 1/2 day Monday and her schedule is full.  Informed that she would be out of the office all week next week after Monday morning. Pt insistent that we call MD. Damaris Schooner to Dr. Alan Ripper nurse, who is off today as well.  Lelon Frohlich is reaching out to Dr. Harrington Challenger now to have her follow up w/ pt. Will forward message to MD & RN.

## 2021-09-04 NOTE — Telephone Encounter (Signed)
-----   Message from Fay Records, MD sent at 09/04/2021 12:21 PM EDT ----- Regarding: FW: Spoke to pt re labs.  He feels great.  No swelling in legs.   Concerned about labs.    Cont meds.   Takes 60 lasix mWF and 40 other days.   Set him up to see me Monday next week ----- Message ----- From: Interface, [facility] Lab Results In Sent: 09/03/2021   6:38 AM EDT To: Fay Records, MD

## 2021-09-07 NOTE — Progress Notes (Signed)
Cardiology Office Note   Date:  09/08/2021   ID:  Luis Herrera, DOB 1940/12/24, MRN [id number]  PCP:  [name], MD  Cardiologist:   [name], MD   F/U of CAD, elevated BNP      History of Present Illness: Luis Herrera is a 81 y.o. male with a history of CAD (s/p CABG with SVG to PDA, 2009), AV dz (s/p AVR in 0175), HTN, diastolic dysfunction and PVCs  Pt  is also followed by [name]   Amiodarone is being used to suppress PVCs    I saw the pt in March 2023     At that time he had some wheezing, some LE edema   At the time BNP was elevated over 5000.   I recomm 60 mg lasix alternating with 40 mg    BNP improved, still not normalized    REcomm lasix 60 x 2 day then alternating 60/40 mg   In May BNP was 1852   He had labs last week   BNP was 3503   Cr stable    The pt returns for follow up    Overall says he has been feeling great    Breathing is OK  No dizziness   No LE edema    Current Meds  Medication Sig   acetaminophen (TYLENOL) 500 MG tablet Take 1,000 mg by mouth as needed for moderate pain.   amiodarone (PACERONE) 200 MG tablet TAKE ONE-HALF TABLET BY MOUTH  DAILY   amLODipine (NORVASC) 5 MG tablet Take 1 tablet (5 mg total) by mouth daily.   aspirin EC 81 MG tablet Take 1 tablet (81 mg total) by mouth daily. Swallow whole.   atorvastatin (LIPITOR) 80 MG tablet TAKE 1 TABLET BY MOUTH ONCE  DAILY   carvedilol (COREG) 6.25 MG tablet TAKE 1 TABLET BY MOUTH  TWICE DAILY   ezetimibe (ZETIA) 10 MG tablet TAKE 1 TABLET BY MOUTH DAILY   furosemide (LASIX) 20 MG tablet Take 3 tablets (60 mg) Monday, Wednesday, and Friday, then 2 tablets (40 mg) all of the other days.   levothyroxine (SYNTHROID) 50 MCG tablet TAKE 1 TABLET BY MOUTH DAILY  BEFORE BREAKFAST   Multiple Vitamin (MULTIVITAMIN WITH MINERALS) TABS Take 1 tablet by mouth daily.   potassium chloride SA (KLOR-CON) 20 MEQ tablet Take 3 tablets the first day, then take one tablet daily   sacubitril-valsartan (ENTRESTO)  49-51 MG Take 1 tablet by mouth 2 (two) times daily.   tamsulosin (FLOMAX) 0.4 MG CAPS capsule Take 0.4 mg by mouth.     Allergies:   Sulfa antibiotics, Sulfamethoxazole-trimethoprim, and Sulfasalazine   Past Medical History:  Diagnosis Date   Aortic stenosis 2008   Arthritis    Blood transfusion    Blood transfusion without reported diagnosis    CAD (coronary artery disease) 2008   Cataract    Complication of anesthesia    hallucinated once   H/O hiatal hernia    Heart murmur    AFTER REPLACED VALVE WENT AWAY   Hx of colonic polyps    Hyperlipidemia    Hypertension    Thyroid disease     Past Surgical History:  Procedure Laterality Date   AORTIC VALVE REPLACEMENT  2009   Bioprosthetic at [place]  2008   [place] GRAFT  2009   SVG-PDA w/ AVR  CORRECTION HAMMER TOE  2011   BILATERAL   GASTROCNEMIUS RECESSION  05/28/2011   POLYPECTOMY     TONSILLECTOMY     TOTAL HIP ARTHROPLASTY Left 2000   TOTAL KNEE ARTHROPLASTY  2000   left   TOTAL KNEE ARTHROPLASTY  12/01/2011   Procedure: TOTAL KNEE ARTHROPLASTY;  Surgeon: Mauri Pole, MD;  Location: [facility];  Service: Orthopedics;  Laterality: Right;   WEIL OSTEOTOMY  05/28/2011     Social History:  The patient  reports that he quit smoking about 48 years ago. His smoking use included cigarettes. He has never used smokeless tobacco. He reports that he does not currently use alcohol. He reports that he does not use drugs.   Family History:  The patient's family history includes Colon cancer (age of onset: 70) in his mother; Heart attack in his brother and father; Hyperlipidemia in an other family member; Hypertension in his brother, sister, and another family member.    ROS:  Please see the history of present illness. All other systems are reviewed and  Negative to the above problem except as noted.    PHYSICAL  EXAM: VS:  BP 140/62   Pulse 62   Ht _0  (1.778 m)   Wt 214 lb (97.1 kg)   SpO2 97%   BMI 30.71 kg/m   GEN: Obese 81 year-old, in no acute distress  HEENT: normal  Neck: JVP is normal   Cardiac: RRR; Gr II/VI systolic murmur LSB   No LE edeMA  Respiratory:  clear to auscultation bilaterally GI: soft, nontender, nondistended, + BS  No hepatomegaly  MS: no deformity Moving all extremities   Skin: warm and dry, no rash Neuro:  Strength and sensation are intact Psych: euthymic mood, full affect   EKG:  EKG shows  SR  RBBB   Occasional PVC   LVH     [place]    01/09/20  Study Highlights  1. NSR with sinus brady and sinus tachycardia 2. Frequent PVC's and PAC's 3. NS AT 4. Rare triplets 5. Bigeminal and trigeminal PVC's   Echo   01/09/20  1. Left ventricular ejection fraction, by estimation, is 55 to 60%. The left ventricle has normal function. The left ventricle demonstrates regional wall motion abnormalities (see scoring diagram/findings for description). The left ventricular internal cavity size was mildly dilated. Left ventricular diastolic parameters are consistent with Grade II diastolic dysfunction (pseudonormalization). 2. Right ventricular systolic function is mildly reduced. The right ventricular size is mildly enlarged. There is normal pulmonary artery systolic pressure. The estimated right ventricular systolic pressure is 43.1 mmHg. 3. The mitral valve is degenerative. Mild mitral valve regurgitation. The mean mitral valve gradient is 5.0 mmHg with average heart rate of 61 bpm in the setting of mild-moderate MAC. 4. Aortic valve regurgitation is mild. There is a unknown size bioprosthetic valve present in the aortic position, LVOT diameter is 2.6 cm. Procedure Date: 2009. Aortic valve mean gradient measures 18.0 mmHg. Grossly stable appearance of aortic valve prosthesis compared to 04/13/2018 exam. See findings for calculations. 5. Aortic dilatation noted. There  is mild dilatation of the ascending aorta, measuring 42 mm. 6. The inferior vena cava is dilated in size with >50% respiratory variability, suggesting right atrial pressure of 8 mmHg. Comparison(s): 09/13/19 EF 60-65%. AV 9mHg mean PG. 04/13/2018 EF 60-65%. AV 134mg mean PG.  Echo 7.28.21  1. Left ventricular ejection fraction, by estimation, is 60 to 65%. The left ventricle has normal function. There is moderate  left ventricular hypertrophy. 2. Right ventricular systolic function is normal. The right ventricular size is normal. There is moderately elevated pulmonary artery systolic pressure. 3. Mild mitral valve regurgitation. 4. Bioprosthetic aortic valve leaflets not well-visualized but appear thickened. The mean gradient has increased from 14.8 mmHg 03/2018 to 32.7 mmHg now. The aortic valve has been repaired/replaced. Aortic valve regurgitation is mild. Moderate aortic valve stenosis. There is a bioprosthetic valve present in the aortic position. Aortic valve area, by VTI measures 1.21 cm. Aortic valve mean gradient measures 32.7 mmHg. Aortic valve Vmax measures 3.96 m/s. 5. Aortic dilatation noted. There is mild dilatation of the ascending aorta. 6. The inferior vena cava is dilated in size with >50% respiratory variability, suggesting right atrial pressure of 8 mmHg. Lipid Panel    Component Value Date/Time   CHOL 150 12/18/2019 1005   TRIG 96 12/18/2019 1005   HDL 54 12/18/2019 1005   CHOLHDL 2.8 12/18/2019 1005   CHOLHDL 3 11/14/2015 0954   VLDL 16.8 11/14/2015 0954   LDLCALC 78 12/18/2019 1005      Wt Readings from Last 3 Encounters:  09/08/21 214 lb (97.1 kg)  05/19/21 224 lb 3.2 oz (101.7 kg)  04/18/21 224 lb 3.2 oz (101.7 kg)      ASSESSMENT AND PLAN:  1. Elevated BNP   On exam volume appears good  Pt feels good Yet, BNP is increased    Will get echo     REeval LV functoin, valvular function.      Keep on same meds for now  2 Hx CAD. S/P CABG x 1  Pt is  without symptoms to sugg ischemia   Follow     3 AV dz  S/p AVR. (Bioprosthesis; 2009; [place])   Last echo in 2021  Mean gradient 18 mm HG   Will repeat   ? If increased . 4.  Renal. Followed by Shirl Harris     Cr stable    5  Hx PVCs    Keep on amiodarone .  Follows with [name]    6  CV dz  Mild plaqing of carotids.    6.  Lipids.  Keep on current regimen.  LDL 72  HDL 58   Trig 87      Current medicines are reviewed at length with the patient today.  The patient does not have concerns regarding medicines.  Signed, [name], MD  09/08/2021 12:12 PM    [address], [place], [place]  [address] Phone: [phone]; Fax: [phone]

## 2021-09-08 ENCOUNTER — Encounter: Payer: Self-pay | Admitting: Internal Medicine

## 2021-09-08 ENCOUNTER — Ambulatory Visit: Payer: Medicare Other | Admitting: Internal Medicine

## 2021-09-08 VITALS — BP 140/62 | HR 62 | Ht 70.0 in | Wt 214.0 lb

## 2021-09-08 DIAGNOSIS — R600 Localized edema: Secondary | ICD-10-CM | POA: Diagnosis not present

## 2021-09-08 NOTE — Patient Instructions (Signed)
Medication Instructions:   *If you need a refill on your cardiac medications before your next appointment, please call your pharmacy*   Lab Work:  If you have labs (blood work) drawn today and your tests are completely normal, you will receive your results only by: [facility] (if you have MyChart) OR A paper copy in the mail If you have any lab test that is abnormal or we need to change your treatment, we will call you to review the results.   Testing/Procedures: Your physician has requested that you have an echocardiogram. Echocardiography is a painless test that uses sound waves to create images of your heart. It provides your doctor with information about the size and shape of your heart and how well your heart's chambers and valves are working. This procedure takes approximately one hour. There are no restrictions for this procedure.    Follow-Up: At [facility], you and your health needs are our priority.  As part of our continuing mission to provide you with exceptional heart care, we have created designated Provider Care Teams.  These Care Teams include your primary Cardiologist (physician) and Advanced Practice Providers (APPs -  Physician Assistants and Nurse Practitioners) who all work together to provide you with the care you need, when you need it.  We recommend signing up for the patient portal called "MyChart".  Sign up information is provided on this After Visit Summary.  MyChart is used to connect with patients for Virtual Visits (Telemedicine).  Patients are able to view lab/test results, encounter notes, upcoming appointments, etc.  Non-urgent messages can be sent to your provider as well.   To learn more about what you can do with MyChart, go to [email or web address].    Your next appointment:   6 month(s)  The format for your next appointment:   In Person  Provider:   [name], MD     Other Instructions   Important Information About Sugar

## 2021-09-18 ENCOUNTER — Ambulatory Visit (HOSPITAL_BASED_OUTPATIENT_CLINIC_OR_DEPARTMENT_OTHER): Payer: Medicare Other

## 2021-09-18 ENCOUNTER — Other Ambulatory Visit: Payer: Self-pay

## 2021-09-18 ENCOUNTER — Observation Stay (HOSPITAL_COMMUNITY)
Admission: RE | Admit: 2021-09-18 | Discharge: 2021-09-19 | Disposition: A | Discharge status: Home / Self Care | Payer: Medicare Other | Source: Ambulatory Visit | Attending: Internal Medicine | Admitting: Internal Medicine

## 2021-09-18 ENCOUNTER — Encounter (HOSPITAL_COMMUNITY): Payer: Self-pay

## 2021-09-18 DIAGNOSIS — I350 Nonrheumatic aortic (valve) stenosis: Principal | ICD-10-CM

## 2021-09-18 DIAGNOSIS — Z7982 Long term (current) use of aspirin: Secondary | ICD-10-CM | POA: Insufficient documentation

## 2021-09-18 DIAGNOSIS — I4510 Unspecified right bundle-branch block: Secondary | ICD-10-CM | POA: Diagnosis not present

## 2021-09-18 DIAGNOSIS — N390 Urinary tract infection, site not specified: Secondary | ICD-10-CM | POA: Diagnosis present

## 2021-09-18 DIAGNOSIS — I5030 Unspecified diastolic (congestive) heart failure: Secondary | ICD-10-CM

## 2021-09-18 DIAGNOSIS — R600 Localized edema: Secondary | ICD-10-CM | POA: Insufficient documentation

## 2021-09-18 DIAGNOSIS — I2510 Atherosclerotic heart disease of native coronary artery without angina pectoris: Secondary | ICD-10-CM | POA: Insufficient documentation

## 2021-09-18 DIAGNOSIS — Z87891 Personal history of nicotine dependence: Secondary | ICD-10-CM | POA: Diagnosis not present

## 2021-09-18 DIAGNOSIS — I5033 Acute on chronic diastolic (congestive) heart failure: Secondary | ICD-10-CM | POA: Diagnosis not present

## 2021-09-18 DIAGNOSIS — Z96642 Presence of left artificial hip joint: Secondary | ICD-10-CM | POA: Diagnosis not present

## 2021-09-18 DIAGNOSIS — I493 Ventricular premature depolarization: Secondary | ICD-10-CM | POA: Diagnosis present

## 2021-09-18 DIAGNOSIS — R0602 Shortness of breath: Secondary | ICD-10-CM | POA: Diagnosis present

## 2021-09-18 DIAGNOSIS — I10 Essential (primary) hypertension: Secondary | ICD-10-CM | POA: Diagnosis present

## 2021-09-18 DIAGNOSIS — Z952 Presence of prosthetic heart valve: Secondary | ICD-10-CM

## 2021-09-18 DIAGNOSIS — Z951 Presence of aortocoronary bypass graft: Secondary | ICD-10-CM | POA: Insufficient documentation

## 2021-09-18 DIAGNOSIS — I5031 Acute diastolic (congestive) heart failure: Secondary | ICD-10-CM

## 2021-09-18 DIAGNOSIS — Z79899 Other long term (current) drug therapy: Secondary | ICD-10-CM | POA: Insufficient documentation

## 2021-09-18 DIAGNOSIS — Z96653 Presence of artificial knee joint, bilateral: Secondary | ICD-10-CM | POA: Diagnosis not present

## 2021-09-18 DIAGNOSIS — E785 Hyperlipidemia, unspecified: Secondary | ICD-10-CM | POA: Diagnosis present

## 2021-09-18 DIAGNOSIS — I110 Hypertensive heart disease with heart failure: Secondary | ICD-10-CM | POA: Insufficient documentation

## 2021-09-18 LAB — COMPREHENSIVE METABOLIC PANEL
ALT: 14 U/L (ref 0–44)
AST: 18 U/L (ref 15–41)
Albumin: 2.9 g/dL — ABNORMAL LOW (ref 3.5–5.0)
Alkaline Phosphatase: 74 U/L (ref 38–126)
Anion gap: 8 (ref 5–15)
BUN: 31 mg/dL — ABNORMAL HIGH (ref 8–23)
CO2: 26 mmol/L (ref 22–32)
Calcium: 8.3 mg/dL — ABNORMAL LOW (ref 8.9–10.3)
Chloride: 111 mmol/L (ref 98–111)
Creatinine, Ser: 1.1 mg/dL (ref 0.61–1.24)
GFR, Estimated: >60 mL/min (ref >60)
Glucose, Bld: 114 mg/dL — ABNORMAL HIGH (ref 70–99)
Potassium: 4.1 mmol/L (ref 3.5–5.1)
Sodium: 145 mmol/L (ref 135–145)
Total Bilirubin: 1.1 mg/dL (ref 0.3–1.2)
Total Protein: 5.9 g/dL — ABNORMAL LOW (ref 6.5–8.1)

## 2021-09-18 LAB — ECHOCARDIOGRAM COMPLETE
AV Mean grad: 36.2 mmHg
AV Peak grad: 69.4 mmHg
Ao pk vel: 4.17 m/s
Area-P 1/2: 4.64 cm2
P 1/2 time: 156 msec
S' Lateral: 4.1 cm

## 2021-09-18 LAB — CBC WITH DIFFERENTIAL/PLATELET
Abs Immature Granulocytes: 0.03 10*3/uL (ref 0.00–0.07)
Basophils Absolute: 0.1 10*3/uL (ref 0.0–0.1)
Basophils Relative: 1 %
Eosinophils Absolute: 0.3 10*3/uL (ref 0.0–0.5)
Eosinophils Relative: 2 %
HCT: 33.7 % — ABNORMAL LOW (ref 39.0–52.0)
Hemoglobin: 10.9 g/dL — ABNORMAL LOW (ref 13.0–17.0)
Immature Granulocytes: 0 %
Lymphocytes Relative: 11 %
Lymphs Abs: 1.4 10*3/uL (ref 0.7–4.0)
MCH: 31.8 pg (ref 26.0–34.0)
MCHC: 32.3 g/dL (ref 30.0–36.0)
MCV: 98.3 fL (ref 80.0–100.0)
Monocytes Absolute: 1.2 10*3/uL — ABNORMAL HIGH (ref 0.1–1.0)
Monocytes Relative: 10 %
Neutro Abs: 9.5 10*3/uL — ABNORMAL HIGH (ref 1.7–7.7)
Neutrophils Relative %: 76 %
Platelets: 160 10*3/uL (ref 150–400)
RBC: 3.43 MIL/uL — ABNORMAL LOW (ref 4.22–5.81)
RDW: 14.5 % (ref 11.5–15.5)
WBC: 12.4 10*3/uL — ABNORMAL HIGH (ref 4.0–10.5)
nRBC: 0 % (ref 0.0–0.2)

## 2021-09-18 LAB — BRAIN NATRIURETIC PEPTIDE: B Natriuretic Peptide: 2254.9 pg/mL — ABNORMAL HIGH (ref 0.0–100.0)

## 2021-09-18 LAB — TSH: TSH: 3.605 u[IU]/mL (ref 0.350–4.500)

## 2021-09-18 MED ORDER — ACETAMINOPHEN 325 MG PO TABS: 650.0000 mg | ORAL_TABLET | ORAL | Status: DC | PRN

## 2021-09-18 MED ORDER — ASPIRIN 81 MG PO TBEC: 81.0000 mg | DELAYED_RELEASE_TABLET | Freq: Every day | ORAL | Status: DC

## 2021-09-18 MED ORDER — AMIODARONE HCL 100 MG PO TABS
100.0000 mg | ORAL_TABLET | Freq: Every day | ORAL | Status: DC
  Administered 2021-09-19: 100 mg via ORAL
  Filled 2021-09-18: qty 1

## 2021-09-18 MED ORDER — ATORVASTATIN CALCIUM 80 MG PO TABS
80.0000 mg | ORAL_TABLET | Freq: Every day | ORAL | Status: DC
  Filled 2021-09-18: qty 1

## 2021-09-18 MED ORDER — ACETAMINOPHEN 500 MG PO TABS
1000.0000 mg | ORAL_TABLET | ORAL | Status: DC | PRN
Start: 2021-09-18 — End: 2021-09-19

## 2021-09-18 MED ORDER — LEVOTHYROXINE SODIUM 50 MCG PO TABS: 50.0000 ug | ORAL_TABLET | Freq: Every day | ORAL | Status: DC

## 2021-09-18 MED ORDER — POTASSIUM CHLORIDE CRYS ER 20 MEQ PO TBCR: 20.0000 meq | EXTENDED_RELEASE_TABLET | Freq: Every day | ORAL | Status: DC

## 2021-09-18 MED ORDER — TAMSULOSIN HCL 0.4 MG PO CAPS
0.4000 mg | ORAL_CAPSULE | Freq: Every day | ORAL | Status: DC
  Filled 2021-09-18: qty 1

## 2021-09-18 MED ORDER — ONDANSETRON HCL 4 MG/2ML IJ SOLN: 4.0000 mg | Freq: Four times a day (QID) | INTRAMUSCULAR | Status: DC | PRN

## 2021-09-18 MED ORDER — AMLODIPINE BESYLATE 5 MG PO TABS
5.0000 mg | ORAL_TABLET | Freq: Every day | ORAL | Status: DC
  Administered 2021-09-19: 5 mg via ORAL
  Filled 2021-09-18: qty 1

## 2021-09-18 MED ORDER — ASPIRIN 81 MG PO CHEW
324.0000 mg | CHEWABLE_TABLET | ORAL | Status: AC
  Administered 2021-09-18: 324 mg via ORAL
  Filled 2021-09-18: qty 4

## 2021-09-18 MED ORDER — EZETIMIBE 10 MG PO TABS
10.0000 mg | ORAL_TABLET | Freq: Every day | ORAL | Status: DC
  Administered 2021-09-19: 10 mg via ORAL
  Filled 2021-09-18: qty 1

## 2021-09-18 MED ORDER — FUROSEMIDE 10 MG/ML IJ SOLN
60.0000 mg | Freq: Once | INTRAMUSCULAR | Status: AC
  Administered 2021-09-18: 60 mg via INTRAVENOUS
  Filled 2021-09-18: qty 6

## 2021-09-18 MED ORDER — POTASSIUM CHLORIDE CRYS ER 20 MEQ PO TBCR
20.0000 meq | EXTENDED_RELEASE_TABLET | Freq: Once | ORAL | Status: AC
  Administered 2021-09-18: 20 meq via ORAL
  Filled 2021-09-18: qty 1

## 2021-09-18 MED ORDER — SACUBITRIL-VALSARTAN 49-51 MG PO TABS
1.0000 | ORAL_TABLET | Freq: Two times a day (BID) | ORAL | Status: DC
  Administered 2021-09-18 – 2021-09-19 (×2): 1 via ORAL
  Filled 2021-09-18 (×2): qty 1

## 2021-09-18 MED ORDER — ADULT MULTIVITAMIN W/MINERALS CH
1.0000 | ORAL_TABLET | Freq: Every day | ORAL | Status: DC
  Administered 2021-09-19: 1 via ORAL
  Filled 2021-09-18: qty 1

## 2021-09-18 MED ORDER — SODIUM CHLORIDE 0.9% FLUSH: 3.0000 mL | INTRAVENOUS | Status: DC | PRN

## 2021-09-18 MED ORDER — AMOXICILLIN-POT CLAVULANATE 875-125 MG PO TABS
1.0000 | ORAL_TABLET | Freq: Two times a day (BID) | ORAL | Status: DC
  Administered 2021-09-18 – 2021-09-19 (×2): 1 via ORAL
  Filled 2021-09-18 (×2): qty 1

## 2021-09-18 MED ORDER — ENOXAPARIN SODIUM 40 MG/0.4ML IJ SOSY
40.0000 mg | PREFILLED_SYRINGE | INTRAMUSCULAR | Status: DC
  Administered 2021-09-18: 40 mg via SUBCUTANEOUS
  Filled 2021-09-18: qty 0.4

## 2021-09-18 MED ORDER — ASPIRIN 81 MG PO TBEC
81.0000 mg | DELAYED_RELEASE_TABLET | Freq: Every day | ORAL | Status: DC
  Administered 2021-09-19: 81 mg via ORAL
  Filled 2021-09-18: qty 1

## 2021-09-18 MED ORDER — CARVEDILOL 6.25 MG PO TABS
6.2500 mg | ORAL_TABLET | Freq: Two times a day (BID) | ORAL | Status: DC
  Administered 2021-09-18 – 2021-09-19 (×2): 6.25 mg via ORAL
  Filled 2021-09-18 (×2): qty 1

## 2021-09-18 MED ORDER — ASPIRIN 300 MG RE SUPP
300.0000 mg | RECTAL | Status: AC
  Filled 2021-09-18: qty 1

## 2021-09-18 MED ORDER — SODIUM CHLORIDE 0.9 % IV SOLN: 250.0000 mL | INTRAVENOUS | Status: DC | PRN

## 2021-09-18 MED ORDER — SODIUM CHLORIDE 0.9% FLUSH: 3.0000 mL | Freq: Two times a day (BID) | INTRAVENOUS | Status: DC

## 2021-09-18 NOTE — H&P (Signed)
Cardiology Admission History and Physical:   Patient ID: Luis Herrera MRN: [id number]; DOB: 01-May-1940   Admission date: 09/18/2021  PCP:  [name], MD   [facility] Providers EP:  Cristopher Peru, MD Cardiologist:  [name], MD  EP   Chief Complaint:  Shortness of breath    Patient Profile:   Luis Herrera is a 81 y.o. male with AV dz, s/p replacement who is being seen 09/18/2021 for the evaluation of worsening aortic stenosis .  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   He had problems with wheezing, LE edema  BNP elevated to 5000.  Lasix increased and BNP improved, edema improved.    In July the pt had repeat labs showed a bump in the BNP    I saw the pt on July 2023   BNP was 3503   He said he was feeling great.  No SOB  No edema  No chest tightness  Today he had an echo done that showed a significant change in hemodynamics across the prosthesis   Mean gradient across the valve 36 mm Hg.     Talking to the pt today he notes wheezing over the past couple days,some chest tightness and mild lightheadedness.  Appetite is down    Denies fevers, was a little cold the past couple days.    Says he feels like he did when he was anemic in the past    Past Medical History:  Diagnosis Date   Aortic stenosis 2008   Arthritis    Blood transfusion    Blood transfusion without reported diagnosis    CAD (coronary artery disease) 2008   Cataract    Complication of anesthesia    hallucinated once   H/O hiatal hernia    Heart murmur    AFTER REPLACED VALVE WENT AWAY   Hx of colonic polyps    Hyperlipidemia    Hypertension    Thyroid disease     Past Surgical History:  Procedure Laterality Date   AORTIC  VALVE REPLACEMENT  2009   Bioprosthetic at [facility] GRAFT  2009   SVG-PDA w/ AVR   CORRECTION HAMMER TOE  2011   BILATERAL   GASTROCNEMIUS RECESSION  05/28/2011   POLYPECTOMY     TONSILLECTOMY     TOTAL HIP ARTHROPLASTY Left 2000   TOTAL KNEE ARTHROPLASTY  2000   left   TOTAL KNEE ARTHROPLASTY  12/01/2011   Procedure: TOTAL KNEE ARTHROPLASTY;  Surgeon: Mauri Pole, MD;  Location: [facility];  Service: Orthopedics;  Laterality: Right;   WEIL OSTEOTOMY  05/28/2011     Medications Prior to Admission: Prior to Admission medications   Medication Sig Start Date End Date Taking? Authorizing Provider  acetaminophen (TYLENOL) 500 MG tablet Take 1,000 mg by mouth as needed for moderate pain.    [provider]  amiodarone (PACERONE) 200 MG tablet TAKE ONE-HALF TABLET BY MOUTH  DAILY 04/23/21   Fay Records, MD  amLODipine (NORVASC) 5 MG tablet Take 1  tablet (5 mg total) by mouth daily. 06/23/21   Fay Records, MD  amoxicillin-clavulanate (AUGMENTIN) 875-125 MG tablet Take 1 tablet by mouth 2 (two) times daily. 09/01/21   [provider]  aspirin EC 81 MG tablet Take 1 tablet (81 mg total) by mouth daily. Swallow whole. 11/02/19   Fay Records, MD  atorvastatin (LIPITOR) 80 MG tablet TAKE 1 TABLET BY MOUTH ONCE  DAILY 04/23/21   Fay Records, MD  carvedilol (COREG) 6.25 MG tablet TAKE 1 TABLET BY MOUTH  TWICE DAILY 12/30/20   Fay Records, MD  ezetimibe (ZETIA) 10 MG tablet TAKE 1 TABLET BY MOUTH DAILY 04/23/21   Fay Records, MD  furosemide (LASIX) 20 MG tablet Take 3 tablets (60 mg) Monday, Wednesday, and Friday, then 2 tablets (40 mg) all of the other days. 09/04/21   Fay Records, MD  furosemide (LASIX) 40 MG tablet Take 1 tablet by mouth every other day. Take on opposite days with 13m dosage of Lasix 08/03/21   [provider]  levothyroxine  (SYNTHROID) 50 MCG tablet TAKE 1 TABLET BY MOUTH DAILY  BEFORE BREAKFAST 04/24/21   RFay Records MD  Multiple Vitamin (MULTIVITAMIN WITH MINERALS) TABS Take 1 tablet by mouth daily.    [provider]  potassium chloride SA (KLOR-CON) 20 MEQ tablet Take 3 tablets the first day, then take one tablet daily 10/16/19   RFay Records MD  sacubitril-valsartan (ENTRESTO) 49-51 MG Take 1 tablet by mouth 2 (two) times daily. 07/28/21   RFay Records MD  tamsulosin (FLOMAX) 0.4 MG CAPS capsule Take 0.4 mg by mouth.    [provider]     Allergies:    Allergies  Allergen Reactions   Sulfa Antibiotics Other (See Comments)   Sulfamethoxazole-Trimethoprim Other (See Comments)    SCarlyn ReichertSyndrome   Sulfasalazine Other (See Comments)    Social History:   Social History   Socioeconomic History   Marital status: Married    Spouse name: Not on file   Number of children: Not on file   Years of education: Not on file   Highest education level: Not on file  Occupational History   Occupation: retired  Tobacco Use   Smoking status: Former    Types: Cigarettes    Quit date: 05/25/1973    Years since quitting: 48.3   Smokeless tobacco: Never  Vaping Use   Vaping Use: Never used  Substance and Sexual Activity   Alcohol use: Not Currently    Comment: rare   Drug use: No   Sexual activity: Not Currently  Other Topics Concern   Not on file  Social History Narrative   Regular exercise-yes. [place]   Social Determinants of Health   Financial Resource Strain: Not on file  Food Insecurity: Not on file  Transportation Needs: Not on file  Physical Activity: Not on file  Stress: Not on file  Social Connections: Not on file  Intimate Partner Violence: Not on file    Family History:   The patient's family history includes Colon cancer (age of onset: 572 in his mother; Heart attack in his brother and father; Hyperlipidemia in an  other family member; Hypertension in his brother, sister, and another family member. There is no history of Stroke, Esophageal cancer, Rectal cancer, or Stomach cancer.    ROS:  Please see the history of present illness.  All other ROS reviewed  and negative.     Physical Exam/Data:  There were no vitals filed for this visit. No intake or output data in the 24 hours ending 09/18/21 2013    09/08/2021   12:02 PM 05/19/2021    9:42 AM 04/18/2021   11:24 AM  Last 3 Weights  Weight (lbs) 214 lb 224 lb 3.2 oz 224 lb 3.2 oz  Weight (kg) 97.07 kg 101.696 kg 101.696 kg     There is no height or weight on file to calculate BMI.  General:  Well nourished, well developed, in no acute distress HEENT: normal Neck:  JVP is increased    Vascular: Bilateral bruits   Distal pulses 2+ bilaterally   Cardiac:  normal S1, S2; RRR;  Gr III/VI later peaking systolic murmur at base   Lungs:  clear to auscultation bilaterally, no wheezing, rhonchi or rales  Abd: soft, nontender, no hepatomegaly  Ext: Triv LLE  edema Musculoskeletal:  No deformities, BUE and BLE strength normal and equal Skin: warm and dry  Neuro:  CNs 2-12 intact, no focal abnormalities noted Psych:  Normal affect    EKG:  The ECG  is not done yet.   Tele  SR with PVC  Relevant CV Studies: Echo  09/19/19 Normal LV function; s/p AVR with elevated mean gradient of 38 mmHg and peak velocity of 4.3 m/s suggestive of severe AS; AI appears mild but pressure half time of 156 suggests possibly more significant; gradient worse compared to 01/09/20; suggest TEE to better assess prosthetic aortic valve. 1. Left ventricular ejection fraction, by estimation, is 60 to 65%. The left ventricle has normal function. The left ventricle has no regional wall motion abnormalities. The left ventricular internal cavity size was mildly dilated. Left ventricular diastolic parameters are consistent with Grade II diastolic dysfunction  (pseudonormalization). 2. Right ventricular systolic function is normal. The right ventricular size is mildly enlarged. 3. 4. Left atrial size was severely dilated. The mitral valve is normal in structure. Mild mitral valve regurgitation. No evidence of mitral stenosis. 5. The aortic valve has been repaired/replaced. Aortic valve regurgitation is mild. Severe aortic valve stenosis. There is a unknown bioprosthetic valve present in the aortic position. Procedure Date: 2009. 6. The inferior vena cava is normal in size with greater than 50% respiratory variability, suggesting right atrial pressure of 3 mmHg.  Echo   01/09/20   1. Left ventricular ejection fraction, by estimation, is 55 to 60%. The left ventricle has normal function. The left ventricle demonstrates regional wall motion abnormalities (see scoring diagram/findings for description). The left ventricular internal cavity size was mildly dilated. Left ventricular diastolic parameters are consistent with Grade II diastolic dysfunction (pseudonormalization). 2. Right ventricular systolic function is mildly reduced. The right ventricular size is mildly enlarged. There is normal pulmonary artery systolic pressure. The estimated right ventricular systolic pressure is 75.6 mmHg. 3. The mitral valve is degenerative. Mild mitral valve regurgitation. The mean mitral valve gradient is 5.0 mmHg with average heart rate of 61 bpm in the setting of mild-moderate MAC. 4. Aortic valve regurgitation is mild. There is a unknown size bioprosthetic valve present in the aortic position, LVOT diameter is 2.6 cm. Procedure Date: 2009. Aortic valve mean gradient measures 18.0 mmHg. Grossly stable appearance of aortic valve prosthesis compared to 04/13/2018 exam. See findings for calculations. 5. Aortic dilatation noted. There is mild dilatation of the ascending aorta, measuring 42 mm. 6. The inferior vena cava is dilated in size with >50% respiratory  variability, suggesting  right atrial pressure of 8 mmHg. Comparison(s): 09/13/19 EF 60-65%. AV 72mHg mean PG. 04/13/2018 EF 60-65%. AV 187mg mean PG.  Monitor 01/09/20  Normal LV function; s/p AVR with elevated mean gradient of 38 mmHg and peak velocity of 4.3 m/s suggestive of severe AS; AI appears mild but pressure half time of 156 suggests possibly more significant; gradient worse compared to 01/09/20; suggest TEE to better assess prosthetic aortic valve. 1. Left ventricular ejection fraction, by estimation, is 60 to 65%. The left ventricle has normal function. The left ventricle has no regional wall motion abnormalities. The left ventricular internal cavity size was mildly dilated. Left ventricular diastolic parameters are consistent with Grade II diastolic dysfunction (pseudonormalization). 2. Right ventricular systolic function is normal. The right ventricular size is mildly enlarged. 3. 4. Left atrial size was severely dilated. The mitral valve is normal in structure. Mild mitral valve regurgitation. No evidence of mitral stenosis. 5. The aortic valve has been repaired/replaced. Aortic valve regurgitation is mild. Severe aortic valve stenosis. There is a unknown bioprosthetic valve present in the aortic position. Procedure Date: 2009. 6. The inferior vena cava is normal in size with greater than 50% respiratory variability, suggesting right atrial pressure of 3 mmHg.  Laboratory Data:  High Sensitivity Troponin:  No results for input(s): "TROPONINIHS" in the last 720 hours.    ChemistryNo results for input(s): "NA", "K", "CL", "CO2", "GLUCOSE", "BUN", "CREATININE", "CALCIUM", "MG", "GFRNONAA", "GFRAA", "ANIONGAP" in the last 168 hours.  No results for input(s): "PROT", "ALBUMIN", "AST", "ALT", "ALKPHOS", "BILITOT" in the last 168 hours. Lipids No results for input(s): "CHOL", "TRIG", "HDL", "LABVLDL", "LDLCALC", "CHOLHDL" in the last 168 hours. HematologyNo results for  input(s): "WBC", "RBC", "HGB", "HCT", "MCV", "MCH", "MCHC", "RDW", "PLT" in the last 168 hours. Thyroid No results for input(s): "TSH", "FREET4" in the last 168 hours. BNPNo results for input(s): "BNP", "PROBNP" in the last 168 hours.  DDimer No results for input(s): "DDIMER" in the last 168 hours.   Radiology/Studies:  ECHOCARDIOGRAM COMPLETE  Result Date: 09/18/2021    ECHOCARDIOGRAM REPORT   Patient Name:   Luis BERTHELOTDate of Exam: 09/18/2021 Medical Rec #:  [id number]   Height:       70.0 in Accession #:    [id number]  Weight:       214.0 lb Date of Birth:  [birth year]    BSA:          2.148 m Patient Age:    [age]      BP:           140/62 mmHg Patient Gender: M             HR:           85 bpm. [place] Echo, Cardiac Doppler and Color Doppler Indications:    R06.00 Dyspnea  History:        Patient has prior history of Echocardiogram examinations, most                 recent 01/09/2020. CAD, AVR-2009. and Prior CABG,                 Signs/Symptoms:Murmur; Risk Factors:Hypertension and                 Dyslipidemia.                 Aortic Valve: unknown bioprosthetic valve is present in the  aortic position. Procedure Date: 2009.  Sonographer:    Cresenciano Lick RDCS Referring Phys: [id number] Omer Puccinelli V Jagger Demonte IMPRESSIONS  1. Normal LV function; s/p AVR with elevated mean gradient of 38 mmHg and peak velocity of 4.3 m/s suggestive of severe AS; AI appears mild but pressure half time of 156 suggests possibly more significant; gradient worse compared to 01/09/20; suggest TEE to better assess prosthetic aortic valve.  2. Left ventricular ejection fraction, by estimation, is 60 to 65%. The left ventricle has normal function. The left ventricle has no regional wall motion abnormalities. The left ventricular internal cavity size was mildly dilated. Left ventricular diastolic parameters are consistent with Grade II diastolic dysfunction (pseudonormalization).   3. Right ventricular systolic function is normal. The right ventricular size is mildly enlarged.  4. Left atrial size was severely dilated.  5. The mitral valve is normal in structure. Mild mitral valve regurgitation. No evidence of mitral stenosis.  6. The aortic valve has been repaired/replaced. Aortic valve regurgitation is mild. Severe aortic valve stenosis. There is a unknown bioprosthetic valve present in the aortic position. Procedure Date: 2009.  7. The inferior vena cava is normal in size with greater than 50% respiratory variability, suggesting right atrial pressure of 3 mmHg. FINDINGS  Left Ventricle: Left ventricular ejection fraction, by estimation, is 60 to 65%. The left ventricle has normal function. The left ventricle has no regional wall motion abnormalities. The left ventricular internal cavity size was mildly dilated. There is  no left ventricular hypertrophy. Left ventricular diastolic parameters are consistent with Grade II diastolic dysfunction (pseudonormalization). Right Ventricle: The right ventricular size is mildly enlarged. Right ventricular systolic function is normal. Left Atrium: Left atrial size was severely dilated. Right Atrium: Right atrial size was normal in size. Pericardium: There is no evidence of pericardial effusion. Mitral Valve: The mitral valve is normal in structure. Mild mitral annular calcification. Mild mitral valve regurgitation. No evidence of mitral valve stenosis. Tricuspid Valve: The tricuspid valve is normal in structure. Tricuspid valve regurgitation is mild . No evidence of tricuspid stenosis. Aortic Valve: The aortic valve has been repaired/replaced. Aortic valve regurgitation is mild. Aortic regurgitation PHT measures 156 msec. Severe aortic stenosis is present. Aortic valve mean gradient measures 36.2 mmHg. Aortic valve peak gradient measures 69.4 mmHg. There is a unknown bioprosthetic valve present in the aortic position. Procedure Date: 2009. Pulmonic  Valve: The pulmonic valve was normal in structure. Pulmonic valve regurgitation is trivial. No evidence of pulmonic stenosis. Aorta: The aortic root is normal in size and structure. Venous: The inferior vena cava is normal in size with greater than 50% respiratory variability, suggesting right atrial pressure of 3 mmHg. IAS/Shunts: No atrial level shunt detected by color flow Doppler. Additional Comments: Normal LV function; s/p AVR with elevated mean gradient of 38 mmHg and peak velocity of 4.3 m/s suggestive of severe AS; AI appears mild but pressure half time of 156 suggests possibly more significant; gradient worse compared to 01/09/20; suggest TEE to better assess prosthetic aortic valve.  LEFT VENTRICLE PLAX 2D LVIDd:         5.90 cm Diastology LVIDs:         4.10 cm LV e' medial:    8.33 cm/s LV PW:         1.00 cm LV E/e' medial:  19.9 LV IVS:        1.00 cm LV e' lateral:   8.33 cm/s  LV E/e' lateral: 19.9  RIGHT VENTRICLE             IVC RV Basal diam:  5.10 cm     IVC diam: 1.65 cm RV S prime:     12.73 cm/s TAPSE (M-mode): 2.6 cm LEFT ATRIUM              Index        RIGHT ATRIUM           Index LA diam:        5.90 cm  2.75 cm/m   RA Area:     25.70 cm LA Vol (A2C):   87.3 ml  40.64 ml/m  RA Volume:   92.20 ml  42.92 ml/m LA Vol (A4C):   111.0 ml 51.67 ml/m LA Biplane Vol: 102.0 ml 47.48 ml/m  AORTIC VALVE AV Vmax:           416.50 cm/s AV Vmean:          283.400 cm/s AV VTI:            0.875 m AV Peak Grad:      69.4 mmHg AV Mean Grad:      36.2 mmHg LVOT Vmax:         86.97 cm/s LVOT Vmean:        61.167 cm/s LVOT VTI:          0.200 m LVOT/AV VTI ratio: 0.23 AI PHT:            156 msec  AORTA Ao Root diam: 3.50 cm MITRAL VALVE                TRICUSPID VALVE MV Area (PHT): 4.64 cm     TR Peak grad:   44.6 mmHg MV Decel Time: 164 msec     TR Vmax:        334.00 cm/s MV E velocity: 166.00 cm/s MV A velocity: 136.50 cm/s  SHUNTS MV E/A ratio:  1.22         Systemic VTI: 0.20  m Kirk Ruths MD Electronically signed by Kirk Ruths MD Signature Date/Time: 09/18/2021/3:23:26 PM    Final      Assessment and Plan:    AV dz   Pt with AV replacement in 2009.   Echo today shows signifciant change in gradients across the valve when compared to echo from Nov 2021    Mean gradient increased from 18 mm Hg to 36 mm Hg.  OVer the past few days he has become symptomatic with SOB, wheezing, mild dizziness    I have arranged for admission to expedite evaluation given symptoms.   Will plan on cardiac CT tomorrow (TAVR) protocol     Structural team has been notified.   Keep NPO in case TEE recommended    2  CAD   Pt with CABG in 2009  (SVG to RCA)   Myoview in October 2018 showed no ischemia    I am not convinced of active angina  The tightness is pleuritic, probably related to AS and diastllic CHF       3   Acute on chronic diastolic CHF    Pt had a increase in BNP but had been feeling good until just a couple days ago   Volume is increased on exam   I would give lasix x 1 and follow tonight     Strict I/Os  4  PVCs   Pt followed in EP  On amiodarone to suppress  Continue telemetry   5  HTN  Follow  6  CV dz   Mild plaquing on carotids  Keep on statin   WIll need to be followed   last carotid USN in 2021    7  HL   Last panel LDL 72 with LDLparticles of 850  HDL 58  Trig 87   Did not adjust meds      8  Renal   Cr 1.17   Follow as diurese and with testing Followed by Shirl Harris as outpt   1}       Severity of Illness: The appropriate patient status for this patient is OBSERVATION. Observation status is judged to be reasonable and necessary in order to provide the required intensity of service to ensure the patient's safety. The patient's presenting symptoms, physical exam findings, and initial radiographic and laboratory data in the context of their medical condition is felt to place them at decreased risk for further clinical deterioration. Furthermore, it is  anticipated that the patient will be medically stable for discharge from the hospital within 2 midnights of admission.    For questions or updates, please contact [place] Please consult [email or web address] for contact info under     Signed, [name], MD  09/18/2021 8:13 PM

## 2021-09-19 ENCOUNTER — Observation Stay (HOSPITAL_COMMUNITY): Payer: Medicare Other

## 2021-09-19 DIAGNOSIS — I110 Hypertensive heart disease with heart failure: Secondary | ICD-10-CM | POA: Diagnosis not present

## 2021-09-19 DIAGNOSIS — Z952 Presence of prosthetic heart valve: Secondary | ICD-10-CM | POA: Diagnosis not present

## 2021-09-19 DIAGNOSIS — I5030 Unspecified diastolic (congestive) heart failure: Secondary | ICD-10-CM

## 2021-09-19 DIAGNOSIS — Z96642 Presence of left artificial hip joint: Secondary | ICD-10-CM | POA: Diagnosis not present

## 2021-09-19 DIAGNOSIS — Z7982 Long term (current) use of aspirin: Secondary | ICD-10-CM | POA: Diagnosis not present

## 2021-09-19 DIAGNOSIS — Z96653 Presence of artificial knee joint, bilateral: Secondary | ICD-10-CM | POA: Diagnosis not present

## 2021-09-19 DIAGNOSIS — I5033 Acute on chronic diastolic (congestive) heart failure: Secondary | ICD-10-CM | POA: Diagnosis not present

## 2021-09-19 DIAGNOSIS — I4510 Unspecified right bundle-branch block: Secondary | ICD-10-CM | POA: Diagnosis not present

## 2021-09-19 DIAGNOSIS — I350 Nonrheumatic aortic (valve) stenosis: Secondary | ICD-10-CM | POA: Diagnosis not present

## 2021-09-19 DIAGNOSIS — Z951 Presence of aortocoronary bypass graft: Secondary | ICD-10-CM | POA: Diagnosis not present

## 2021-09-19 DIAGNOSIS — I2510 Atherosclerotic heart disease of native coronary artery without angina pectoris: Secondary | ICD-10-CM | POA: Diagnosis not present

## 2021-09-19 DIAGNOSIS — I5031 Acute diastolic (congestive) heart failure: Secondary | ICD-10-CM

## 2021-09-19 DIAGNOSIS — R600 Localized edema: Secondary | ICD-10-CM | POA: Diagnosis not present

## 2021-09-19 DIAGNOSIS — Z79899 Other long term (current) drug therapy: Secondary | ICD-10-CM | POA: Diagnosis not present

## 2021-09-19 DIAGNOSIS — Z87891 Personal history of nicotine dependence: Secondary | ICD-10-CM | POA: Diagnosis not present

## 2021-09-19 MED ORDER — IOHEXOL 350 MG/ML SOLN
100.0000 mL | Freq: Once | INTRAVENOUS | Status: AC | PRN
  Administered 2021-09-19: 100 mL via INTRAVENOUS

## 2021-09-19 MED ORDER — FUROSEMIDE 10 MG/ML IJ SOLN
40.0000 mg | Freq: Once | INTRAMUSCULAR | Status: DC
Start: 2021-09-19 — End: 2021-09-19

## 2021-09-19 MED ORDER — FUROSEMIDE 20 MG PO TABS: 60.0000 mg | ORAL_TABLET | Freq: Every day | ORAL | 11 refills | Status: DC

## 2021-09-19 NOTE — Consult Note (Addendum)
[place]  [facility]iology Consultation:   Patient ID: Luis Herrera MRN: [id number]; DOB: 1940/02/17  Admit date: 09/18/2021 Date of Consult: 09/19/2021  Primary Care Provider: [name], MD [facility] Cardiologist: [name], MD / Dr. Cristopher Peru, MD (EP)  Patient Profile:   Luis Herrera is a 81 y.o. 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 Harrington Challenger.    History of Present Illness:   Luis Herrera is a very pleasant 81yo M who lives in [place] with his wife. He is originally from [place] however moved to [facility] in 2012 to be close to family. He is retired from [employer] under[employer]. He and his wife are very active in their community and stay busy all of the time. He is able to perform ADL/IADLs without any issue. He does not require an assistive device for ambulation however more recently has felt that he may need his cane due to fatigue and SOB with activities that used to be easy for him. He has a partial upper bridge and has not followed with a dentist in a while but has no active dental issues.   Luis Herrera is followed by Dr. Harrington Challenger for his cardiology care. He underwent CABGx1 with AVR in 2009 while still living in [place]. His symptoms were similar at that time with SOB and fatigue prompting a cardiac catheterization and subsequent surgery. After moving to [place], he was followed at [facility] however transitioned to [facility] in 2015. His AVR has been followed with surveillance echocardiograms with stable gradients noted, mostly ranging in the 18-22 mmHg range. Echocardiogram from 12/2019 showed an LVEF at 55-60% with an unknown size bioprosthetic valve present in the aortic position with a mean gradient  measuring 18.0 mmHg.   More recently he has been having issues with worsening SOB and fluid volume overload. He was seen by Dr. Harrington Challenger 04/2021 at which time a BNP was obtained which was noted to be elevated at 5716. His Lasix was increased (28m BID alternating with 60/452m and BNP was repeated. This remained elevated however was somewhat improved to 1852. He he states that over the last week or so, his exertional SOB has worsened and he has noticed increased fatigue with simple activities. This prompted an appointment 09/18/21 with Dr. RoHarrington ChallengerHis BNP was noted to be elevated once again despite increased Lasix and HF compliance. An echocardiogram was obtained which showed normal LVEF and increased AV gradients with a mean at 38 mmHg and peak at 69.98m698m, and AVA by VTI at 0.87. Given this, Dr. RosHarrington Challengerlt he needed to be admitted for CHF with TAVR workup.   On my exam, he is resting in the bed. His wife and daughter are at bedside. He he denies SOB, LE edema, palpitations, orthopnea, chest pain, and no overt syncope. He does report some mild dizziness however this is in the setting of being outside (heat?).   Past Medical History:  Diagnosis Date   Aortic stenosis 2008   Arthritis    Blood transfusion    Blood transfusion without reported diagnosis    CAD (coronary artery disease) 2008   Cataract    Complication of anesthesia    hallucinated once   H/O  hiatal hernia    Heart murmur    AFTER REPLACED VALVE WENT AWAY   Hx of colonic polyps    Hyperlipidemia    Hypertension    Thyroid disease     Past Surgical History:  Procedure Laterality Date   AORTIC VALVE REPLACEMENT  2009   Bioprosthetic at [place]  2008   CHOLECYSTECTOMY  1982   COLONOSCOPY     CORONARY ARTERY BYPASS GRAFT  2009   SVG-PDA w/ AVR   CORRECTION HAMMER TOE  2011   BILATERAL   GASTROCNEMIUS RECESSION  05/28/2011   POLYPECTOMY     TONSILLECTOMY     TOTAL HIP  ARTHROPLASTY Left 2000   TOTAL KNEE ARTHROPLASTY  2000   left   TOTAL KNEE ARTHROPLASTY  12/01/2011   Procedure: TOTAL KNEE ARTHROPLASTY;  Surgeon: Mauri Pole, MD;  Location: [facility];  Service: Orthopedics;  Laterality: Right;   WEIL OSTEOTOMY  05/28/2011     Home Medications:  Prior to Admission medications   Medication Sig Start Date End Date Taking? Authorizing Provider  acetaminophen (TYLENOL) 500 MG tablet Take 1,000 mg by mouth daily as needed.   Yes [provider]  amiodarone (PACERONE) 200 MG tablet TAKE ONE-HALF TABLET BY MOUTH  DAILY Patient taking differently: Take 100 mg by mouth daily. 04/23/21  Yes Fay Records, MD  amLODipine (NORVASC) 5 MG tablet Take 1 tablet (5 mg total) by mouth daily. 06/23/21  Yes Fay Records, MD  aspirin EC 81 MG tablet Take 1 tablet (81 mg total) by mouth daily. Swallow whole. Patient taking differently: Take 81 mg by mouth daily. 11/02/19  Yes Fay Records, MD  atorvastatin (LIPITOR) 80 MG tablet TAKE 1 TABLET BY MOUTH ONCE  DAILY Patient taking differently: Take 80 mg by mouth daily. 04/23/21  Yes Fay Records, MD  carvedilol (COREG) 6.25 MG tablet TAKE 1 TABLET BY MOUTH  TWICE DAILY Patient taking differently: Take 6.25 mg by mouth 2 (two) times daily. 12/30/20  Yes Fay Records, MD  ezetimibe (ZETIA) 10 MG tablet TAKE 1 TABLET BY MOUTH DAILY Patient taking differently: Take 10 mg by mouth daily. 04/23/21  Yes Fay Records, MD  furosemide (LASIX) 20 MG tablet Take 3 tablets (60 mg) Monday, Wednesday, and Friday, then 2 tablets (40 mg) all of the other days. Patient taking differently: Take 60 mg by mouth every Monday, Wednesday, and Friday. 09/04/21  Yes Fay Records, MD  furosemide (LASIX) 40 MG tablet Take 40 mg by mouth See admin instructions. 40 mg Sunday, Tuesday, Thursday, Saturday 08/03/21  Yes [provider]  levothyroxine (SYNTHROID) 50 MCG tablet TAKE 1 TABLET BY MOUTH DAILY  BEFORE BREAKFAST Patient taking  differently: Take 50 mcg by mouth daily before breakfast. 04/24/21  Yes Fay Records, MD  sacubitril-valsartan (ENTRESTO) 49-51 MG Take 1 tablet by mouth 2 (two) times daily. 07/28/21  Yes Fay Records, MD  tamsulosin (FLOMAX) 0.4 MG CAPS capsule Take 0.4 mg by mouth daily.   Yes [provider]  potassium chloride SA (KLOR-CON) 20 MEQ tablet Take 3 tablets the first day, then take one tablet daily Patient not taking: Reported on 09/19/2021 10/16/19   Fay Records, MD    Inpatient Medications: Scheduled Meds:  amiodarone  100 mg Oral Daily   amLODipine  5 mg Oral Daily   amoxicillin-clavulanate  1 tablet Oral BID   aspirin EC  81 mg Oral Daily   atorvastatin  80 mg Oral Daily   carvedilol  6.25 mg Oral BID   enoxaparin (LOVENOX) injection  40 mg Subcutaneous Q24H   ezetimibe  10 mg Oral Daily   levothyroxine  50 mcg Oral QAC breakfast   multivitamin with minerals  1 tablet Oral Daily   sacubitril-valsartan  1 tablet Oral BID   sodium chloride flush  3 mL Intravenous Q12H   tamsulosin  0.4 mg Oral Daily   Continuous Infusions:  sodium chloride     PRN Meds: sodium chloride, acetaminophen, acetaminophen, ondansetron (ZOFRAN) IV, sodium chloride flush  Allergies:    Allergies  Allergen Reactions   Sulfa Antibiotics Other (See Comments)    Carlyn Reichert Syndrome   Bactrim [Sulfamethoxazole-Trimethoprim] Other (See Comments)    Carlyn Reichert Syndrome    Social History:   Social History   Socioeconomic History   Marital status: Married    Spouse name: Not on file   Number of children: Not on file   Years of education: Not on file   Highest education level: Not on file  Occupational History   Occupation: retired  Tobacco Use   Smoking status: Former    Types: Cigarettes    Quit date: 05/25/1973    Years since quitting: 48.3   Smokeless tobacco: Never  Vaping Use   Vaping Use: Never used  Substance and Sexual Activity   Alcohol use: Not Currently    Comment:  rare   Drug use: No   Sexual activity: Not Currently  Other Topics Concern   Not on file  Social History Narrative   Regular exercise-yes. Pt lives in [place] with his wife.   Social Determinants of Health   Financial Resource Strain: Not on file  Food Insecurity: Not on file  Transportation Needs: Not on file  Physical Activity: Not on file  Stress: Not on file  Social Connections: Not on file  Intimate Partner Violence: Not on file    Family History:    Family History  Problem Relation Age of Onset   Colon cancer Mother 44   Heart attack Father    Hypertension Other    Hyperlipidemia Other    Heart attack Brother    Hypertension Sister    Hypertension Brother    Stroke Neg Hx    Esophageal cancer Neg Hx    Rectal cancer Neg Hx    Stomach cancer Neg Hx     ROS:  Please see the history of present illness.   All other ROS reviewed and negative.     Physical Exam/Data:   Vitals:   09/18/21 2350 09/19/21 0211 09/19/21 0456 09/19/21 0808  BP: (!) 131/43  (!) 135/37 (!) 141/41  Pulse: 81     Resp: _0 Temp: 99.3 F (37.4 C)  98.3 F (36.8 C) 98 F (36.7 C)  TempSrc: Oral  Oral   SpO2: 90%  92% 93%  Weight:  97.6 kg      Intake/Output Summary (Last 24 hours) at 09/19/2021 1100 Last data filed at 09/19/2021 0457 Gross per 24 hour  Intake 240 ml  Output 1275 ml  Net -1035 ml      09/19/2021    2:11 AM 09/08/2021   12:02 PM 05/19/2021    9:42 AM  Last 3 Weights  Weight (lbs) 215 lb 1.6 oz 214 lb 224 lb 3.2 oz  Weight (kg) 97.569 kg 97.07 kg 101.696 kg  Body mass index is 30.86 kg/m.   General: Well developed, well nourished, NAD Neck: Negative for carotid bruits. No JVD Lungs:Clear to ausculation bilaterally although diminished bilaterally. No wheezes, rales, or rhonchi. Breathing is unlabored. Cardiovascular: RRR with S1 S2. Harsh systolic murmur.  Abdomen: Soft, non-tender, non-distended. No obvious abdominal  masses. Extremities: No edema.  Neuro: Alert and oriented. No focal deficits. No facial asymmetry. MAE spontaneously. Psych: Responds to questions appropriately with normal affect.     EKG:  The EKG was personally reviewed and demonstrates:  NSR with known RBBB, HR 81bpm.  Telemetry:  Telemetry was personally reviewed and demonstrates:  09/19/21 NSR with HR in the 70-80's.   Relevant CV Studies:  Echocardiogram 09/18/21:  Normal LV function; s/p AVR with elevated mean gradient of 38 mmHg and peak velocity of 4.3 m/s suggestive of severe AS; AI appears mild but pressure half time of 156 suggests possibly more significant; gradient worse compared to 01/09/20; suggest TEE to better assess prosthetic aortic valve. 1. Left ventricular ejection fraction, by estimation, is 60 to 65%. The left ventricle has normal function. The left ventricle has no regional wall motion abnormalities. The left ventricular internal cavity size was mildly dilated. Left ventricular diastolic parameters are consistent with Grade II diastolic dysfunction (pseudonormalization). 2. Right ventricular systolic function is normal. The right ventricular size is mildly enlarged. 3. 4. Left atrial size was severely dilated. The mitral valve is normal in structure. Mild mitral valve regurgitation. No evidence of mitral stenosis. 5. The aortic valve has been repaired/replaced. Aortic valve regurgitation is mild. Severe aortic valve stenosis. There is a unknown bioprosthetic valve present in the aortic position. Procedure Date: 2009. 6. The inferior vena cava is normal in size with greater than 50% respiratory variability, suggesting right atrial pressure of 3 mmHg.  Echocardiogram 01/09/2020:   1. Left ventricular ejection fraction, by estimation, is 55 to 60%. The left ventricle has normal function. The left ventricle demonstrates regional wall motion abnormalities (see scoring diagram/findings for description). The  left ventricular internal cavity size was mildly dilated. Left ventricular diastolic parameters are consistent with Grade II diastolic dysfunction (pseudonormalization). 2. Right ventricular systolic function is mildly reduced. The right ventricular size is mildly enlarged. There is normal pulmonary artery systolic pressure. The estimated right ventricular systolic pressure is 74.8 mmHg. 3. The mitral valve is degenerative. Mild mitral valve regurgitation. The mean mitral valve gradient is 5.0 mmHg with average heart rate of 61 bpm in the setting of mild-moderate MAC. 4. Aortic valve regurgitation is mild. There is a unknown size bioprosthetic valve present in the aortic position, LVOT diameter is 2.6 cm. Procedure Date: 2009. Aortic valve mean gradient measures 18.0 mmHg. Grossly stable appearance of aortic valve prosthesis compared to 04/13/2018 exam. See findings for calculations. 5. Aortic dilatation noted. There is mild dilatation of the ascending aorta, measuring 42 mm. 6. The inferior vena cava is dilated in size with >50% respiratory variability, suggesting right atrial pressure of 8 mmHg. Comparison(s): 09/13/19 EF 60-65%. AV 75mHg mean PG.  Laboratory Data:  High Sensitivity Troponin:  No results for input(s): "TROPONINIHS" in the last 720 hours.   Chemistry Recent Labs  Lab 09/18/21 2159  NA 145  K 4.1  CL 111  CO2 26  GLUCOSE 114*  BUN 31*  CREATININE 1.10  CALCIUM 8.3*  GFRNONAA >60  ANIONGAP 8    Recent Labs  Lab 09/18/21 2159  PROT 5.9*  ALBUMIN 2.9*  AST 18  ALT 14  ALKPHOS 74  BILITOT 1.1   Hematology Recent Labs  Lab 09/18/21 2159  WBC 12.4*  RBC 3.43*  HGB 10.9*  HCT 33.7*  MCV 98.3  MCH 31.8  MCHC 32.3  RDW 14.5  PLT 160   BNP Recent Labs  Lab 09/18/21 2159  BNP 2,254.9*    DDimer No results for input(s): "DDIMER" in the last 168 hours.   Radiology/Studies:  ECHOCARDIOGRAM COMPLETE  Result Date: 09/18/2021    ECHOCARDIOGRAM  REPORT   Patient Name:   Luis Herrera  Date of Exam: 09/18/2021 Medical Rec #:  [id number]     Height:       70.0 in Accession #:    [id number]    Weight:       214.0 lb Date of Birth:  1941-01-05      BSA:          2.148 m Patient Age:    42 years      BP:           140/62 mmHg Patient Gender: M             HR:           85 bpm. Exam Location:  [facility] Procedure: 2D Echo, Cardiac Doppler and Color Doppler Indications:    R06.00 Dyspnea  History:        Patient has prior history of Echocardiogram examinations, most                 recent 01/09/2020. CAD, AVR-2009. and Prior CABG,                 Signs/Symptoms:Murmur; Risk Factors:Hypertension and                 Dyslipidemia.                 Aortic Valve: unknown bioprosthetic valve is present in the                 aortic position. Procedure Date: 2009.  Sonographer:    Cresenciano Lick RDCS Referring Phys: [id number] PAULA V ROSS IMPRESSIONS  1. Normal LV function; s/p AVR with elevated mean gradient of 38 mmHg and peak velocity of 4.3 m/s suggestive of severe AS; AI appears mild but pressure half time of 156 suggests possibly more significant; gradient worse compared to 01/09/20; suggest TEE to better assess prosthetic aortic valve.  2. Left ventricular ejection fraction, by estimation, is 60 to 65%. The left ventricle has normal function. The left ventricle has no regional wall motion abnormalities. The left ventricular internal cavity size was mildly dilated. Left ventricular diastolic parameters are consistent with Grade II diastolic dysfunction (pseudonormalization).  3. Right ventricular systolic function is normal. The right ventricular size is mildly enlarged.  4. Left atrial size was severely dilated.  5. The mitral valve is normal in structure. Mild mitral valve regurgitation. No evidence of mitral stenosis.  6. The aortic valve has been repaired/replaced. Aortic valve regurgitation is mild. Severe aortic valve stenosis. There is a unknown  bioprosthetic valve present in the aortic position. Procedure Date: 2009.  7. The inferior vena cava is normal in size with greater than 50% respiratory variability, suggesting right atrial pressure of 3 mmHg. FINDINGS  Left Ventricle: Left ventricular ejection fraction, by estimation, is 60 to 65%. The left ventricle has normal function. The left ventricle has no regional wall motion abnormalities. The left ventricular internal cavity size was mildly dilated. There is  no left ventricular hypertrophy.  Left ventricular diastolic parameters are consistent with Grade II diastolic dysfunction (pseudonormalization). Right Ventricle: The right ventricular size is mildly enlarged. Right ventricular systolic function is normal. Left Atrium: Left atrial size was severely dilated. Right Atrium: Right atrial size was normal in size. Pericardium: There is no evidence of pericardial effusion. Mitral Valve: The mitral valve is normal in structure. Mild mitral annular calcification. Mild mitral valve regurgitation. No evidence of mitral valve stenosis. Tricuspid Valve: The tricuspid valve is normal in structure. Tricuspid valve regurgitation is mild . No evidence of tricuspid stenosis. Aortic Valve: The aortic valve has been repaired/replaced. Aortic valve regurgitation is mild. Aortic regurgitation PHT measures 156 msec. Severe aortic stenosis is present. Aortic valve mean gradient measures 36.2 mmHg. Aortic valve peak gradient measures 69.4 mmHg. There is a unknown bioprosthetic valve present in the aortic position. Procedure Date: 2009. Pulmonic Valve: The pulmonic valve was normal in structure. Pulmonic valve regurgitation is trivial. No evidence of pulmonic stenosis. Aorta: The aortic root is normal in size and structure. Venous: The inferior vena cava is normal in size with greater than 50% respiratory variability, suggesting right atrial pressure of 3 mmHg. IAS/Shunts: No atrial level shunt detected by color flow Doppler.  Additional Comments: Normal LV function; s/p AVR with elevated mean gradient of 38 mmHg and peak velocity of 4.3 m/s suggestive of severe AS; AI appears mild but pressure half time of 156 suggests possibly more significant; gradient worse compared to 01/09/20; suggest TEE to better assess prosthetic aortic valve.  LEFT VENTRICLE PLAX 2D LVIDd:         5.90 cm Diastology LVIDs:         4.10 cm LV e' medial:    8.33 cm/s LV PW:         1.00 cm LV E/e' medial:  19.9 LV IVS:        1.00 cm LV e' lateral:   8.33 cm/s                        LV E/e' lateral: 19.9  RIGHT VENTRICLE             IVC RV Basal diam:  5.10 cm     IVC diam: 1.65 cm RV S prime:     12.73 cm/s TAPSE (M-mode): 2.6 cm LEFT ATRIUM              Index        RIGHT ATRIUM           Index LA diam:        5.90 cm  2.75 cm/m   RA Area:     25.70 cm LA Vol (A2C):   87.3 ml  40.64 ml/m  RA Volume:   92.20 ml  42.92 ml/m LA Vol (A4C):   111.0 ml 51.67 ml/m LA Biplane Vol: 102.0 ml 47.48 ml/m  AORTIC VALVE AV Vmax:           416.50 cm/s AV Vmean:          283.400 cm/s AV VTI:            0.875 m AV Peak Grad:      69.4 mmHg AV Mean Grad:      36.2 mmHg LVOT Vmax:         86.97 cm/s LVOT Vmean:        61.167 cm/s LVOT VTI:          0.200 m LVOT/AV VTI ratio:  0.23 AI PHT:            156 msec  AORTA Ao Root diam: 3.50 cm MITRAL VALVE                TRICUSPID VALVE MV Area (PHT): 4.64 cm     TR Peak grad:   44.6 mmHg MV Decel Time: 164 msec     TR Vmax:        334.00 cm/s MV E velocity: 166.00 cm/s MV A velocity: 136.50 cm/s  SHUNTS MV E/A ratio:  1.22         Systemic VTI: 0.20 m Kirk Ruths MD Electronically signed by Kirk Ruths MD Signature Date/Time: 09/18/2021/3:23:26 PM    Final      STS Risk Calculator: Procedure Type:  Isolated AVR Perioperative Outcome Estimate % Operative Mortality 5.22% Morbidity & Mortality 19.5% Stroke 3.37% Renal Failure 5.51% Reoperation 6.32% Prolonged Ventilation 12.2% Deep Sternal Wound Infection  0.169% [facility] Stay (>14 days) 8.78% [facility] Stay (<6 days)* 26.1%   [facility] Cardiomyopathy Questionnaire     09/19/2021   12:36 PM  KCCQ-12  1 a. Ability to shower/bathe Not at all limited  1 b. Ability to walk 1 block Moderately limited  1 c. Ability to hurry/jog Moderately limited  2. Edema feet/ankles/legs 1-2 times a week  3. Limited by fatigue 1-2 times a week  4. Limited by dyspnea At least once a day  6. Limited enjoyment of life Moderately limited  7. Rest of life w/ symptoms Somewhat satisfied  8 a. Participation in hobbies Moderately limited  8 b. Participation in chores Moderately limited  8 c. Visiting family/friends Slightly limited    Assessment and Plan:   Luis Herrera is a 81 y.o. male with symptoms of severe, stage D aortic stenosis with NYHA Class III symptoms. I have reviewed the patient's recent echocardiogram which is notable for normal LV systolic function and severe aortic stenosis with peak gradient of 69.4 mmHg and mean transvalvular gradient of 38 mmHg. The patient's dimensionless index is 0.23 and calculated aortic valve area is 0.87 cm.    I have reviewed the natural history of aortic stenosis with the patient. We have discussed the limitations of medical therapy and the poor prognosis associated with symptomatic aortic stenosis. We have reviewed potential treatment options, including palliative medical therapy, conventional surgical aortic valve replacement, and transcatheter aortic valve replacement. We discussed treatment options in the context of this patient's specific comorbid medical conditions.    The patient's predicted risk of mortality with conventional aortic valve replacement is 5.22% primarily based on advanced age with prior CABG with AVR. Other significant comorbid conditions include HTN and obesity. Valve in valve TAVR seems like a reasonable treatment option for this patient pending formal cardiac surgical consultation. We  discussed typical evaluation which will require a gated cardiac CTA and a CTA of the chest/abdomen/pelvis to evaluate both his cardiac anatomy and peripheral vasculature which has been ordered for today however may be delayed due to CT availability. Due to the time since last coronary evaluation, he will also require repeat cardiac catheterization to assess bypass graft and native vessels. Once this has been complete and he is stable from a HF standpoint, he can be discharged and we will have him seen TCTS surgeon in the OP setting prior to scheduling TAVR.    For questions or updates, please contact [facility] Please consult [email or web address] for contact info under    Signed, Kathyrn Drown,  NP  09/19/2021 11:00 AM    ATTENDING ATTESTATION:  After conducting a review of all available clinical information with the care team, interviewing the patient, and performing a physical exam, I agree with the findings and plan described in this note.   GEN: No acute distress.   HEENT:  MMM, no JVD, no scleral icterus Cardiac: RRR, soft SEM, without rubs, or gallops.  Respiratory: Clear to auscultation bilaterally. GI: Soft, nontender, non-distended  MS: No edema; No deformity. Neuro:  Nonfocal  Vasc:  +2 radial pulses; +2 femoral pulses  The patient is a very pleasant 81 year old male with a history of aortic valve replacement with concomitant CABG consisting of the vein graft to PDA in 2009, hypertension, PVCs on amiodarone, and chronic diastolic dysfunction who is admitted due to acute on chronic diastolic heart failure.  He was diuresed and feels much improved.  He tells me that over the last several months he has had increasing shortness of breath with exertion.  The symptoms never occur at rest.  He is also had chest fullness particularly at rest which is improved after diuresis.  He denies any presyncope or syncope.  He denies any exertional chest pain.  He did have some peripheral edema which  resolved with IV Lasix.  He has had a decline in his functional capacity over this time.  He is very active member of his community and wishes he could do most of his activities without having to stop and rest frequently.  He endorses NYHA class II-III symptoms.  I had an opportunity to review his echocardiogram which demonstrates a significantly degenerated aortic valve bioprosthesis of unknown identity and size.  At this point in time we will refer the patient for a TAVR protocol CTA.  We will administer another dose of IV Lasix and increase his standing dose to 60 mg daily.  I think it is safe for him to be discharged home.  We will plan on outpatient cardiac catheterization and cardiothoracic surgery evaluation.  All questions were answered.  Lenna Sciara, MD Pager [phone]

## 2021-09-19 NOTE — TOC Progression Note (Signed)
Transition of Care [facility]) - Progression Note    Patient Details  Name: Luis Herrera MRN: [id number] Date of Birth: February 11, 1941  Transition of Care [facility]) CM/SW Contact  Zenon Mayo, RN Phone Number: 09/19/2021, 5:12 PM  Clinical Narrative:     Patient is for dc , no needs.       Expected Discharge Plan and Services           Expected Discharge Date: 09/19/21                                     Social Determinants of Health (SDOH) Interventions    Readmission Risk Interventions     No data to display

## 2021-09-19 NOTE — Discharge Summary (Addendum)
[facility] TEAM  Discharge Summary    Patient ID: Luis Herrera MRN: [id number]; DOB: December 23, 1940  Admit date: 09/18/2021 Discharge date: 09/19/2021  Primary Care Provider: [name], MD  Primary Cardiologist: [name], MD   Discharge Diagnoses    Principal Problem:   Aortic stenosis Active Problems:   Hyperlipidemia with target LDL less than 130   Essential hypertension   Aortic valve replaced   Frequent PVCs   Aortic valve stenosis   Right bundle branch block   Frequent UTI   Acute diastolic heart failure (HCC)  Allergies Allergies  Allergen Reactions   Sulfa Antibiotics Other (See Comments)    Carlyn Reichert Syndrome   Bactrim [Sulfamethoxazole-Trimethoprim] Other (See Comments)    Carlyn Reichert Syndrome   Diagnostic Studies/Procedures    Pre TAVR CT imaging: Performed 09/19/21 with pending results.  _____________   Echocardiogram 09/18/21:   Normal LV function; s/p AVR with elevated mean gradient of 38 mmHg and peak velocity of 4.3 m/s suggestive of severe AS; AI appears mild but pressure half time of 156 suggests possibly more significant; gradient worse compared to 01/09/20; suggest TEE to better assess prosthetic aortic valve. 1. Left ventricular ejection fraction, by estimation, is 60 to 65%. The left ventricle has normal function. The left ventricle has no regional wall motion abnormalities. The left ventricular internal cavity size was mildly dilated. Left ventricular diastolic parameters are consistent with Grade II diastolic dysfunction (pseudonormalization). 2. Right ventricular systolic function is normal. The right ventricular size is mildly enlarged. 3. 4. Left atrial size was severely dilated. The mitral valve is normal in structure. Mild mitral valve regurgitation. No evidence of mitral stenosis. 5. The aortic valve has been repaired/replaced. Aortic valve regurgitation is mild.  Severe aortic valve stenosis. There is a unknown bioprosthetic valve present in the aortic position. Procedure Date: 2009. 6. The inferior vena cava is normal in size with greater than 50% respiratory variability, suggesting right atrial pressure of 3 mmHg.   Echocardiogram 01/09/2020:    1. Left ventricular ejection fraction, by estimation, is 55 to 60%. The left ventricle has normal function. The left ventricle demonstrates regional wall motion abnormalities (see scoring diagram/findings for description). The left ventricular internal cavity size was mildly dilated. Left ventricular diastolic parameters are consistent with Grade II diastolic dysfunction (pseudonormalization). 2. Right ventricular systolic function is mildly reduced. The right ventricular size is mildly enlarged. There is normal pulmonary artery systolic pressure. The estimated right ventricular systolic pressure is 96.7 mmHg. 3. The mitral valve is degenerative. Mild mitral valve regurgitation. The mean mitral valve gradient is 5.0 mmHg with average heart rate of 61 bpm in the setting of mild-moderate MAC. 4. Aortic valve regurgitation is mild. There is a unknown size bioprosthetic valve present in the aortic position, LVOT diameter is 2.6 cm. Procedure Date: 2009. Aortic valve mean gradient measures 18.0 mmHg. Grossly stable appearance of aortic valve prosthesis compared to 04/13/2018 exam. See findings for calculations. 5. Aortic dilatation noted. There is mild dilatation of the ascending aorta, measuring 42 mm. 6. The inferior vena cava is dilated in size with >50% respiratory variability, suggesting right atrial pressure of 8 mmHg. Comparison(s): 09/13/19 EF 60-65%. AV 10mHg mean PG.  History of Present Illness     DDyrell Tuccillois a 81y.o. male with a [facility]G with SVG to PDA 2009), HTN, PVCs on  Amiodarone (followed by  EP) and chronic diastolic dysfunction who is being seen today for the evaluation of worsening aortic stenosis at the request of Dr. Harrington Challenger.  Hospital Course      Mr. Corning is followed by Dr. Harrington Challenger for his cardiology care. He underwent CABGx1 with AVR in 2009 while still living in [place]. His symptoms were similar at that time with SOB and fatigue prompting a cardiac catheterization and subsequent surgery. After moving to [place], he was followed at [facility] however transitioned to [facility] in 2015. His AVR has been followed with surveillance echocardiograms with stable gradients noted, mostly ranging in the 18-22 mmHg range. Echocardiogram from 12/2019 showed an LVEF at 55-60% with an unknown size bioprosthetic valve present in the aortic position with a mean gradient measuring 18.0 mmHg.    More recently he has been having issues with worsening SOB and fluid volume overload. He was seen by Dr. Harrington Challenger 04/2021 at which time a BNP was obtained which was noted to be elevated at 5716. His Lasix was increased (68m BID alternating with 60/470m and BNP was repeated. This remained elevated however was somewhat improved to 1852. He he states that over the last week or so, his exertional SOB has worsened and he has noticed increased fatigue with simple activities. This prompted an appointment 09/18/21 with Dr. RoHarrington ChallengerHis BNP was noted to be elevated once again despite increased Lasix and HF compliance. An echocardiogram was obtained which showed normal LVEF and increased AV gradients with a mean at 38 mmHg and peak at 69.47m38m, and AVA by VTI at 0.87. Given this, Dr. RosHarrington Challengerlt he needed to be admitted for CHF with TAVR workup.    On admission, he was treated with IV Lasix with resolution of his LE edema. He was seen by the Structural Heart team and underwent Pre TAVR CT imaging. Given his response to Lasix, plan will be follow up with Dr. ThuAli Lowe18/23 at 11am to further discuss pre TAVR cardiac catheterization and surgical  evaluation. We will increase his home Lasix dosing to 96m70mily. Plan to follow BMET at follow up with Dr. ThukAli LoweConsultants: None    The patient was seen and examined by Dr. ThukAli Lowe felt to be stable and ready for discharge today, 09/19/21 after CT scans performed. OP follow up has been scheduled. The patient will undergo further TAVR workup in the OP setting.  _____________  Discharge Vitals Blood pressure (!) 143/43, pulse 75, temperature 97.7 F (36.5 C), temperature source Oral, resp. rate 18, weight 97.6 kg, SpO2 90 %.  Filed Weights   09/19/21 0211  Weight: 97.6 kg   Labs & Radiologic Studies    CBC Recent Labs    09/18/21 2159  WBC 12.4*  NEUTROABS 9.5*  HGB 10.9*  HCT 33.7*  MCV 98.3  PLT 160 268asic Metabolic Panel Recent Labs    09/18/21 2159  NA 145  K 4.1  CL 111  CO2 26  GLUCOSE 114*  BUN 31*  CREATININE 1.10  CALCIUM 8.3*   Liver Function Tests Recent Labs    09/18/21 2159  AST 18  ALT 14  ALKPHOS 74  BILITOT 1.1  PROT 5.9*  ALBUMIN 2.9*   No results for input(s): "LIPASE", "AMYLASE" in the last 72 hours. Cardiac Enzymes No results for input(s): "CKTOTAL", "CKMB", "CKMBINDEX", "TROPONINI" in the last 72 hours. BNP Invalid input(s): "POCBNP" D-Dimer No results for input(s): "DDIMER" in the last 72 hours. Hemoglobin A1C No results  for input(s): "HGBA1C" in the last 72 hours. Fasting Lipid Panel No results for input(s): "CHOL", "HDL", "LDLCALC", "TRIG", "CHOLHDL", "LDLDIRECT" in the last 72 hours. Thyroid Function Tests Recent Labs    09/18/21 2159  TSH 3.605   _____________  ECHOCARDIOGRAM COMPLETE  Result Date: 09/18/2021    ECHOCARDIOGRAM REPORT   Patient Name:   GRESHAM CAETANO  Date of Exam: 09/18/2021 Medical Rec #:  [id number]     Height:       70.0 in Accession #:    [id number]    Weight:       214.0 lb Date of Birth:  1940/08/08      BSA:          2.148 m Patient Age:    81 years      BP:           140/62 mmHg Patient  Gender: M             HR:           85 bpm. Exam Location:  [place] Procedure: 2D Echo, Cardiac Doppler and Color Doppler Indications:    R06.00 Dyspnea  History:        Patient has prior history of Echocardiogram examinations, most                 recent 01/09/2020. CAD, AVR-2009. and Prior CABG,                 Signs/Symptoms:Murmur; Risk Factors:Hypertension and                 Dyslipidemia.                 Aortic Valve: unknown bioprosthetic valve is present in the                 aortic position. Procedure Date: 2009.  Sonographer:    Cresenciano Lick RDCS Referring Phys: [id number] PAULA V ROSS IMPRESSIONS  1. Normal LV function; s/p AVR with elevated mean gradient of 38 mmHg and peak velocity of 4.3 m/s suggestive of severe AS; AI appears mild but pressure half time of 156 suggests possibly more significant; gradient worse compared to 01/09/20; suggest TEE to better assess prosthetic aortic valve.  2. Left ventricular ejection fraction, by estimation, is 60 to 65%. The left ventricle has normal function. The left ventricle has no regional wall motion abnormalities. The left ventricular internal cavity size was mildly dilated. Left ventricular diastolic parameters are consistent with Grade II diastolic dysfunction (pseudonormalization).  3. Right ventricular systolic function is normal. The right ventricular size is mildly enlarged.  4. Left atrial size was severely dilated.  5. The mitral valve is normal in structure. Mild mitral valve regurgitation. No evidence of mitral stenosis.  6. The aortic valve has been repaired/replaced. Aortic valve regurgitation is mild. Severe aortic valve stenosis. There is a unknown bioprosthetic valve present in the aortic position. Procedure Date: 2009.  7. The inferior vena cava is normal in size with greater than 50% respiratory variability, suggesting right atrial pressure of 3 mmHg. FINDINGS  Left Ventricle: Left ventricular ejection fraction, by estimation, is 60 to  65%. The left ventricle has normal function. The left ventricle has no regional wall motion abnormalities. The left ventricular internal cavity size was mildly dilated. There is  no left ventricular hypertrophy. Left ventricular diastolic parameters are consistent with Grade II diastolic dysfunction (pseudonormalization). Right Ventricle: The right ventricular size is mildly enlarged. Right ventricular systolic function  is normal. Left Atrium: Left atrial size was severely dilated. Right Atrium: Right atrial size was normal in size. Pericardium: There is no evidence of pericardial effusion. Mitral Valve: The mitral valve is normal in structure. Mild mitral annular calcification. Mild mitral valve regurgitation. No evidence of mitral valve stenosis. Tricuspid Valve: The tricuspid valve is normal in structure. Tricuspid valve regurgitation is mild . No evidence of tricuspid stenosis. Aortic Valve: The aortic valve has been repaired/replaced. Aortic valve regurgitation is mild. Aortic regurgitation PHT measures 156 msec. Severe aortic stenosis is present. Aortic valve mean gradient measures 36.2 mmHg. Aortic valve peak gradient measures 69.4 mmHg. There is a unknown bioprosthetic valve present in the aortic position. Procedure Date: 2009. Pulmonic Valve: The pulmonic valve was normal in structure. Pulmonic valve regurgitation is trivial. No evidence of pulmonic stenosis. Aorta: The aortic root is normal in size and structure. Venous: The inferior vena cava is normal in size with greater than 50% respiratory variability, suggesting right atrial pressure of 3 mmHg. IAS/Shunts: No atrial level shunt detected by color flow Doppler. Additional Comments: Normal LV function; s/p AVR with elevated mean gradient of 38 mmHg and peak velocity of 4.3 m/s suggestive of severe AS; AI appears mild but pressure half time of 156 suggests possibly more significant; gradient worse compared to 01/09/20; suggest TEE to better assess  prosthetic aortic valve.  LEFT VENTRICLE PLAX 2D LVIDd:         5.90 cm Diastology LVIDs:         4.10 cm LV e' medial:    8.33 cm/s LV PW:         1.00 cm LV E/e' medial:  19.9 LV IVS:        1.00 cm LV e' lateral:   8.33 cm/s                        LV E/e' lateral: 19.9  RIGHT VENTRICLE             IVC RV Basal diam:  5.10 cm     IVC diam: 1.65 cm RV S prime:     12.73 cm/s TAPSE (M-mode): 2.6 cm LEFT ATRIUM              Index        RIGHT ATRIUM           Index LA diam:        5.90 cm  2.75 cm/m   RA Area:     25.70 cm LA Vol (A2C):   87.3 ml  40.64 ml/m  RA Volume:   92.20 ml  42.92 ml/m LA Vol (A4C):   111.0 ml 51.67 ml/m LA Biplane Vol: 102.0 ml 47.48 ml/m  AORTIC VALVE AV Vmax:           416.50 cm/s AV Vmean:          283.400 cm/s AV VTI:            0.875 m AV Peak Grad:      69.4 mmHg AV Mean Grad:      36.2 mmHg LVOT Vmax:         86.97 cm/s LVOT Vmean:        61.167 cm/s LVOT VTI:          0.200 m LVOT/AV VTI ratio: 0.23 AI PHT:            156 msec  AORTA Ao Root diam: 3.50 cm MITRAL VALVE  TRICUSPID VALVE MV Area (PHT): 4.64 cm     TR Peak grad:   44.6 mmHg MV Decel Time: 164 msec     TR Vmax:        334.00 cm/s MV E velocity: 166.00 cm/s MV A velocity: 136.50 cm/s  SHUNTS MV E/A ratio:  1.22         Systemic VTI: 0.20 m Kirk Ruths MD Electronically signed by Kirk Ruths MD Signature Date/Time: 09/18/2021/3:23:26 PM    Final     Disposition   Pt is being discharged home today in good condition.  Follow-up Plans & Appointments    Follow-up Information     Early Osmond, MD Follow up on 10/03/2021.   Specialty: Cardiology Why: at 11am. Please arrive at 10:45am. Contact information: [place] [address] [phone]                Discharge Instructions     Call MD for:  difficulty breathing, headache or visual disturbances   Complete by: As directed    Call MD for:  extreme fatigue   Complete by: As directed    Call MD for:   hives   Complete by: As directed    Call MD for:  persistant dizziness or light-headedness   Complete by: As directed    Call MD for:  persistant nausea and vomiting   Complete by: As directed    Call MD for:  redness, tenderness, or signs of infection (pain, swelling, redness, odor or green/yellow discharge around incision site)   Complete by: As directed    Call MD for:  severe uncontrolled pain   Complete by: As directed    Call MD for:  temperature >100.4   Complete by: As directed    Diet - low sodium heart healthy   Complete by: As directed    Discharge instructions   Complete by: As directed    Please follow up with Dr. Ali Lowe 8/18 for instructions for the rest of your TAVR workup. We have increased your daily Lasix dose to 26m daily. We will recheck labs at follow up in two weeks.   Increase activity slowly   Complete by: As directed       Discharge Medications   Allergies as of 09/19/2021       Reactions   Sulfa Antibiotics Other (See Comments)   SCarlyn ReichertSyndrome   Bactrim [sulfamethoxazole-trimethoprim] Other (See Comments)   SCarlyn ReichertSyndrome        Medication List     TAKE these medications    acetaminophen 500 MG tablet Commonly known as: TYLENOL Take 1,000 mg by mouth daily as needed.   amiodarone 200 MG tablet Commonly known as: PACERONE TAKE ONE-HALF TABLET BY MOUTH  DAILY   amLODipine 5 MG tablet Commonly known as: NORVASC Take 1 tablet (5 mg total) by mouth daily.   aspirin EC 81 MG tablet Take 1 tablet (81 mg total) by mouth daily. Swallow whole. What changed: additional instructions   atorvastatin 80 MG tablet Commonly known as: LIPITOR TAKE 1 TABLET BY MOUTH ONCE  DAILY   carvedilol 6.25 MG tablet Commonly known as: COREG TAKE 1 TABLET BY MOUTH  TWICE DAILY   Entresto 49-51 MG Generic drug: sacubitril-valsartan Take 1 tablet by mouth 2 (two) times daily.   ezetimibe 10 MG tablet Commonly known as: ZETIA TAKE 1  TABLET BY MOUTH DAILY   furosemide 20 MG tablet Commonly known as: Lasix  Take 3 tablets (60 mg total) by mouth daily. What changed:  how much to take how to take this when to take this additional instructions Another medication with the same name was removed. Continue taking this medication, and follow the directions you see here.   levothyroxine 50 MCG tablet Commonly known as: SYNTHROID TAKE 1 TABLET BY MOUTH DAILY  BEFORE BREAKFAST   potassium chloride SA 20 MEQ tablet Commonly known as: KLOR-CON M Take 3 tablets the first day, then take one tablet daily   tamsulosin 0.4 MG Caps capsule Commonly known as: FLOMAX Take 0.4 mg by mouth daily.        Outstanding Labs/Studies   BMET  Duration of Discharge Encounter   Greater than 30 minutes including physician time.  SignedKathyrn Drown, NP 09/19/2021, 3:57 PM [phone]  ATTENDING ATTESTATION:  After conducting a review of all available clinical information with the care team, interviewing the patient, and performing a physical exam, I agree with the findings and plan described in this note.   GEN: No acute distress.   HEENT:  MMM, no JVD, no scleral icterus Cardiac: RRR, 3/6 SEM without rubs, or gallops.  Respiratory: Clear to auscultation bilaterally. GI: Soft, nontender, non-distended  MS: No edema; No deformity. Neuro:  Nonfocal  Vasc:  +2 radial pulses  The patient is doing well after IV diuresis.  We will augment his standing dose of Lasix to 60 mg daily.  After his TAVR protocol CT scan I think he can be discharged home with plans for outpatient cardiac catheterization and CT surgical evaluation.  Lenna Sciara, MD Pager [phone]

## 2021-09-20 LAB — LIPOPROTEIN A (LPA): Lipoprotein (a): 339 nmol/L — ABNORMAL HIGH (ref <75.0)

## 2021-09-22 ENCOUNTER — Encounter: Payer: Self-pay | Admitting: Internal Medicine

## 2021-09-22 ENCOUNTER — Ambulatory Visit: Payer: Medicare Other | Admitting: Cardiology

## 2021-09-22 VITALS — BP 120/40 | HR 76 | Ht 70.0 in | Wt 221.0 lb

## 2021-09-22 DIAGNOSIS — Z952 Presence of prosthetic heart valve: Secondary | ICD-10-CM

## 2021-09-22 DIAGNOSIS — I5031 Acute diastolic (congestive) heart failure: Secondary | ICD-10-CM

## 2021-09-22 DIAGNOSIS — I5043 Acute on chronic combined systolic (congestive) and diastolic (congestive) heart failure: Secondary | ICD-10-CM | POA: Diagnosis not present

## 2021-09-22 DIAGNOSIS — R600 Localized edema: Secondary | ICD-10-CM | POA: Diagnosis not present

## 2021-09-22 DIAGNOSIS — I2510 Atherosclerotic heart disease of native coronary artery without angina pectoris: Secondary | ICD-10-CM

## 2021-09-22 DIAGNOSIS — I493 Ventricular premature depolarization: Secondary | ICD-10-CM | POA: Diagnosis not present

## 2021-09-22 DIAGNOSIS — I350 Nonrheumatic aortic (valve) stenosis: Secondary | ICD-10-CM | POA: Diagnosis not present

## 2021-09-22 DIAGNOSIS — I10 Essential (primary) hypertension: Secondary | ICD-10-CM

## 2021-09-22 LAB — BASIC METABOLIC PANEL
BUN/Creatinine Ratio: 26 — ABNORMAL HIGH (ref 10–24)
BUN: 30 mg/dL — ABNORMAL HIGH (ref 8–27)
CO2: 24 mmol/L (ref 20–29)
Calcium: 8.1 mg/dL — ABNORMAL LOW (ref 8.6–10.2)
Chloride: 109 mmol/L — ABNORMAL HIGH (ref 96–106)
Creatinine, Ser: 1.15 mg/dL (ref 0.76–1.27)
Glucose: 93 mg/dL (ref 70–99)
Potassium: 3.3 mmol/L — ABNORMAL LOW (ref 3.5–5.2)
Sodium: 145 mmol/L — ABNORMAL HIGH (ref 134–144)
eGFR: 64 mL/min/{1.73_m2} (ref >59)

## 2021-09-22 LAB — CBC
Hematocrit: 33 % — ABNORMAL LOW (ref 37.5–51.0)
Hemoglobin: 11 g/dL — ABNORMAL LOW (ref 13.0–17.7)
MCH: 31 pg (ref 26.6–33.0)
MCHC: 33.3 g/dL (ref 31.5–35.7)
MCV: 93 fL (ref 79–97)
Platelets: 209 10*3/uL (ref 150–450)
RBC: 3.55 x10E6/uL — ABNORMAL LOW (ref 4.14–5.80)
RDW: 14.1 % (ref 11.6–15.4)
WBC: 8.5 10*3/uL (ref 3.4–10.8)

## 2021-09-22 LAB — PRO B NATRIURETIC PEPTIDE: NT-Pro BNP: 12282 pg/mL — ABNORMAL HIGH (ref 0–486)

## 2021-09-22 MED ORDER — FUROSEMIDE 40 MG PO TABS: 60.0000 mg | ORAL_TABLET | Freq: Two times a day (BID) | ORAL | 3 refills | Status: DC

## 2021-09-22 MED ORDER — POTASSIUM CHLORIDE CRYS ER 20 MEQ PO TBCR: 20.0000 meq | EXTENDED_RELEASE_TABLET | Freq: Every day | ORAL | 3 refills | Status: DC

## 2021-09-22 NOTE — Patient Instructions (Addendum)
Medication Instructions:  Your physician has recommended you make the following change in your medication:  INCREASE LASIX TO 60 MG TWICE DAILY.  *If you need a refill on your cardiac medications before your next appointment, please call your pharmacy*   Lab Work: TODAY: STAT BMET, CBC, STAT BNP If you have labs (blood work) drawn today and your tests are completely normal, you will receive your results only by: [place] (if you have MyChart) OR A paper copy in the mail If you have any lab test that is abnormal or we need to change your treatment, we will call you to review the results.   Testing/Procedures:  [place] OFFICE [place], [place]  [place] [address] Dept: [phone] Loc: [place]  09/22/2021  You are scheduled for a Cardiac Catheterization on Wednesday, August 9 with Dr. Lenna Sciara.  1. Please arrive at the [facility] En[facility] [place],  [address] at 12:30 PM (This time is two hours before your procedure to ensure your preparation). Free valet parking service is available.   Special note: Every effort is made to have your procedure done on time. Please understand that emergencies sometimes delay scheduled procedures.  2. Diet: Do not eat solid foods after midnight.  You may have clear liquids until 5 AM upon the day of the procedure.  3. Labs: You will need to have blood drawn on TODAY 4. Medication instructions in preparation for your procedure:   Contrast Allergy: No  Stop taking, Lasix (Furosemide)  Wednesday, August 9,  On the morning of your procedure, take Aspirin and any morning medicines NOT listed above.  You may use sips of water.  5. Plan to go home the same day, you will only stay overnight if medically necessary. 6. You MUST have a responsible adult to drive you home. 7. An adult MUST be  with you the first 24 hours after you arrive home. 8. Bring a current list of your medications, and the last time and date medication taken. 9. Bring ID and current insurance cards. 10.Please wear clothes that are easy to get on and off and wear slip-on shoes.  Thank you for allowing [place] to care for you!   -- [facility]    Follow-Up: At [facility], you and your health needs are our priority.  As part of our continuing mission to provide you with exceptional heart care, we have created designated Provider Care Teams.  These Care Teams include your primary Cardiologist (physician) and Advanced Practice Providers (APPs -  Physician Assistants and Nurse Practitioners) who all work together to provide you with the care you need, when you need it.  We recommend signing up for the patient portal called "MyChart".  Sign up information is provided on this After Visit Summary.  MyChart is used to connect with patients for Virtual Visits (Telemedicine).  Patients are able to view lab/test results, encounter notes, upcoming appointments, etc.  Non-urgent messages can be sent to your provider as well.   To learn more about what you can do with MyChart, go to [email or web address].    Your next appointment:   KEEP SCHEDULED FOLLOW-UP  Important Information About Sugar

## 2021-09-22 NOTE — H&P (View-Only) (Signed)
[facility]   MULTIDISCIPLINARY HEART VALVE CLINIC                                     Cardiology Office Note:    Date:  09/22/2021   ID:  Luis Herrera, DOB 01/14/1941, MRN [id number]  PCP:  Banks, Shannon R, MD  [facility] Cardiologist:  Paula Ross, MD  [facility] Electrophysiologist:  None   Referring MD: Banks, Shannon R, MD   Chief Complaint  Patient presents with   Follow-up    CHF    History of Present Illness:    Luis Herrera is a 81 y.o. 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   [name] is followed by Dr. Ross for his cardiology care. He underwent CABGx1 with AVR in 2009 while still living in [place]. His symptoms were similar at that time with SOB and fatigue prompting a cardiac catheterization and subsequent surgery. After moving to [place], he was followed at [facility] however [facility] 2015. His AVR has been followed with surveillance echocardiograms with stable gradients noted, mostly ranging in the 18-22 mmHg range. Echocardiogram from 12/2019 showed an LVEF at 55-60% with an unknown size bioprosthetic valve present in the aortic position with a mean gradient measuring 18.0 mmHg.    More recently he has been having issues with worsening SOB and fluid volume overload. He was seen by Dr. Ross 04/2021 at which time a BNP was obtained which was noted to be elevated at 5716. His Lasix was increased (60mg BID alternating with 60/40mg) and BNP was repeated. This remained elevated however was somewhat improved to 1852. He he states that over the last week or so, his exertional SOB has worsened and he has noticed increased fatigue with simple activities. This prompted an appointment 09/18/21 with Dr. Ross. His BNP was noted to be elevated once again  despite increased Lasix and HF compliance. An echocardiogram was obtained which showed normal LVEF and increased AV gradients with a mean at 38 mmHg and peak at 69.4mmHg, and AVA by VTI at 0.87. Given this, Dr. Ross felt he needed to be admitted for CHF with TAVR workup.    On admission, he was treated with IV Lasix with resolution of his LE edema. He was seen by the Structural Heart team and underwent Pre TAVR CT imaging. Given his response to Lasix, plan will be follow up with Dr. Thukkani 10/03/21 at 11am to further discuss pre TAVR cardiac catheterization and surgical evaluation. His home Lasix was increased to 60mg daily.   Unfortunately he called our office this morning with complaints of weight gain, orthopnea, and exertional SOB over the weekend. He watches his weight closely and reports he was amitted with a weight at 217lb and decreased to 215lb. By this morning he was up to 219lb and 221lb in our office. His LE are very edematous on exam with 3+ pitting edema however overall appears stable. He states that since hospital discharge, he has been unable to lie flat and has been coughing more than usual. He has been complaint with his Lasix and follows a low sodium diet. He has no chest pain, palpitations, dizziness, bleeding in stool   or urine, no syncopal or pre-syncopal symptoms.   Past Medical History:  Diagnosis Date   Aortic stenosis 2008   Arthritis    Blood transfusion    Blood transfusion without reported diagnosis    CAD (coronary artery disease) 2008   Cataract    Complication of anesthesia    hallucinated once   H/O hiatal hernia    Heart murmur    AFTER REPLACED VALVE WENT AWAY   Hx of colonic polyps    Hyperlipidemia    Hypertension    Thyroid disease     Past Surgical History:  Procedure Laterality Date   AORTIC VALVE REPLACEMENT  2009   Bioprosthetic at [place]  2008   [place] GRAFT  2009   SVG-PDA w/ AVR   CORRECTION HAMMER TOE  2011   BILATERAL   GASTROCNEMIUS RECESSION  05/28/2011   POLYPECTOMY     TONSILLECTOMY     TOTAL HIP ARTHROPLASTY Left 2000   TOTAL KNEE ARTHROPLASTY  2000   left   TOTAL KNEE ARTHROPLASTY  12/01/2011   Procedure: TOTAL KNEE ARTHROPLASTY;  Surgeon: Mauri Pole, MD;  Location: [facility];  Service: Orthopedics;  Laterality: Right;   WEIL OSTEOTOMY  05/28/2011    Current Medications: Current Meds  Medication Sig   acetaminophen (TYLENOL) 500 MG tablet Take 1,000 mg by mouth daily as needed.   amiodarone (PACERONE) 200 MG tablet TAKE ONE-HALF TABLET BY MOUTH  DAILY (Patient taking differently: Take 100 mg by mouth daily.)   amLODipine (NORVASC) 5 MG tablet Take 1 tablet (5 mg total) by mouth daily.   aspirin EC 81 MG tablet Take 1 tablet (81 mg total) by mouth daily. Swallow whole.   atorvastatin (LIPITOR) 80 MG tablet TAKE 1 TABLET BY MOUTH ONCE  DAILY   carvedilol (COREG) 6.25 MG tablet TAKE 1 TABLET BY MOUTH  TWICE DAILY   ezetimibe (ZETIA) 10 MG tablet TAKE 1 TABLET BY MOUTH DAILY   furosemide (LASIX) 40 MG tablet Take 1.5 tablets (60 mg total) by mouth 2 (two) times daily.   levothyroxine (SYNTHROID) 50 MCG tablet TAKE 1 TABLET BY MOUTH DAILY  BEFORE BREAKFAST   sacubitril-valsartan (ENTRESTO) 49-51 MG Take 1 tablet by mouth 2 (two) times daily.   tamsulosin (FLOMAX) 0.4 MG CAPS capsule Take 0.4 mg by mouth daily.   [DISCONTINUED] furosemide (LASIX) 20 MG tablet Take 3 tablets (60 mg total) by mouth daily.   [DISCONTINUED] potassium chloride SA (KLOR-CON) 20 MEQ tablet Take 3 tablets the first day, then take one tablet daily     Allergies:   Sulfa antibiotics and Bactrim [sulfamethoxazole-trimethoprim]   Social History   Socioeconomic History   Marital status: Married    Spouse name: Not on file   Number of children: Not on file   Years of education: Not on file   Highest education level: Not on  file  Occupational History   Occupation: retired  Tobacco Use   Smoking status: Former    Types: Cigarettes    Quit date: 05/25/1973    Years since quitting: 48.3   Smokeless tobacco: Never  Vaping Use   Vaping Use: Never used  Substance and Sexual Activity   Alcohol use: Not Currently    Comment: rare   Drug use: No   Sexual activity: Not Currently  Other Topics Concern   Not on file  Social History Narrative   Regular exercise-yes. Pt lives in [place] with his wife.   Social Determinants of Health   Financial Resource Strain: Not on file  Food Insecurity: Not on file  Transportation Needs: Not on file  Physical Activity: Not on file  Stress: Not on file  Social Connections: Not on file     Family History: The patient's family history includes Colon cancer (age of onset: 28) in his mother; Heart attack in his brother and father; Hyperlipidemia in an other family member; Hypertension in his brother, sister, and another family member. There is no history of Stroke, Esophageal cancer, Rectal cancer, or Stomach cancer.  ROS:   Please see the history of present illness.    All other systems reviewed and are negative.  EKGs/Labs/Other Studies Reviewed:    The following studies were reviewed today:  Pre TAVR CT imaging: Performed 09/19/21 with pending results.  _____________   Echocardiogram 09/18/21:   Normal LV function; s/p AVR with elevated mean gradient of 38 mmHg and peak velocity of 4.3 m/s suggestive of severe AS; AI appears mild but pressure half time of 156 suggests possibly more significant; gradient worse compared to 01/09/20; suggest TEE to better assess prosthetic aortic valve. 1. Left ventricular ejection fraction, by estimation, is 60 to 65%. The left ventricle has normal function. The left ventricle has no regional wall motion abnormalities. The left ventricular internal cavity size was mildly dilated. Left ventricular diastolic  parameters are consistent with Grade II diastolic dysfunction (pseudonormalization). 2. Right ventricular systolic function is normal. The right ventricular size is mildly enlarged. 3. 4. Left atrial size was severely dilated. The mitral valve is normal in structure. Mild mitral valve regurgitation. No evidence of mitral stenosis. 5. The aortic valve has been repaired/replaced. Aortic valve regurgitation is mild. Severe aortic valve stenosis. There is a unknown bioprosthetic valve present in the aortic position. Procedure Date: 2009. 6. The inferior vena cava is normal in size with greater than 50% respiratory variability, suggesting right atrial pressure of 3 mmHg.   Echocardiogram 01/09/2020:    1. Left ventricular ejection fraction, by estimation, is 55 to 60%. The left ventricle has normal function. The left ventricle demonstrates regional wall motion abnormalities (see scoring diagram/findings for description). The left ventricular internal cavity size was mildly dilated. Left ventricular diastolic parameters are consistent with Grade II diastolic dysfunction (pseudonormalization). 2. Right ventricular systolic function is mildly reduced. The right ventricular size is mildly enlarged. There is normal pulmonary artery systolic pressure. The estimated right ventricular systolic pressure is 33.2 mmHg. 3. The mitral valve is degenerative. Mild mitral valve regurgitation. The mean mitral valve gradient is 5.0 mmHg with average heart rate of 61 bpm in the setting of mild-moderate MAC. 4. Aortic valve regurgitation is mild. There is a unknown size bioprosthetic valve present in the aortic position, LVOT diameter is 2.6 cm. Procedure Date: 2009. Aortic valve mean gradient measures 18.0 mmHg. Grossly stable appearance of aortic valve prosthesis compared to 04/13/2018 exam. See findings for calculations. 5. Aortic dilatation noted. There is mild dilatation of the ascending aorta, measuring  42 mm. 6. The inferior vena cava is dilated in size with >50% respiratory variability, suggesting right atrial pressure of 8 mmHg. Comparison(s): 09/13/19 EF 60-65%. AV 42mHg mean PG.  EKG:  EKG is not ordered today.    Recent Labs: 09/02/2021: NT-Pro BNP 3,503 09/18/2021: ALT 14; B Natriuretic Peptide 2,254.9; BUN 31; Creatinine, Ser 1.10; Hemoglobin 10.9; Platelets 160; Potassium 4.1;  Sodium 145; TSH 3.605   Recent Lipid Panel    Component Value Date/Time   CHOL 150 12/18/2019 1005   TRIG 96 12/18/2019 1005   HDL 54 12/18/2019 1005   CHOLHDL 2.8 12/18/2019 1005   CHOLHDL 3 11/14/2015 0954   VLDL 16.8 11/14/2015 0954   LDLCALC 78 12/18/2019 1005   Physical Exam:    VS:  BP (!) 120/40 (BP Location: Left Arm, Patient Position: Sitting, Cuff Size: Normal)   Pulse 76   Ht _0  (1.778 m)   Wt 221 lb (100.2 kg)   SpO2 96%   BMI 31.71 kg/m     Wt Readings from Last 3 Encounters:  09/22/21 221 lb (100.2 kg)  09/19/21 215 lb 1.6 oz (97.6 kg)  09/08/21 214 lb (97.1 kg)    General: Well developed, well nourished, NAD Neck: Negative for carotid bruits. No JVD Lungs:Clear to ausculation bilaterally, diminished in bilateral LL. No wheezes. Breathing is unlabored. Cardiovascular: RRR with S1 S2. Harsh systolic murmurs Abdomen: Soft, non-tender. No obvious abdominal masses. Extremities: 3+ BLE edema.  Neuro: Alert and oriented. No focal deficits. No facial asymmetry. MAE spontaneously. Psych: Responds to questions appropriately with normal affect.    ASSESSMENT/PLAN:    Severe AS with recurrent acute diastolic CHF: Patient has been followed by Dr. Harrington Challenger for CHF symptoms with diuretic adjustments. Echocardiogram performed 09/18/21 showed normal LV function s/p AVR with elevated mean gradient of 38 mmHg and peak velocity of 4.3 m/s suggestive of severe AS. He was admitted to the hospital and treated with IV Lasix x2 dosing with improvement. Pre TAVR CT imaging performed and is under review  at this time. Plan was for OP follow up with Dr. Ali Lowe for cath and surgical OP appointment. Called the office with weight gain and SOB. Weight is up to 221lb today>>215lb on Friday. Lasix was increased to 25m daily at discharge (from 491mdaily). Will increase Lasix to 6015mID for now and obtain BMET, BNP, CBC today. May need further adjustment to 65m18mD or transition to torsemide based on response and lab work. Plan for cardiac catheterization with Dr. ThukAli Lowenesday 09/24/21. May need IV Lasix at that time.   The patient understands that risks include but are not limited to stroke (1 in 1000), death (1 in 100026idney failure [usually temporary] (1 in 500), bleeding (1 in 200), allergic reaction [possibly serious] (1 in 200), and agrees to proceed.    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 Continue current regimen  HTN: Stable with no changes needed today  PVCs: Controlled on Amiodarone, follows with Dr. TaylLovena LeMedication Adjustments/Labs and Tests Ordered: Current medicines are reviewed at length with the patient today.  Concerns regarding medicines are outlined above.   Orders Placed This Encounter  Procedures   CBC   Pro b natriuretic peptide (BNP)   Basic metabolic panel   Meds ordered this encounter  Medications   potassium chloride SA (KLOR-CON M) 20 MEQ tablet    Sig: Take 1 tablet (20 mEq total) by mouth daily.    Dispense:  90 tablet    Refill:  3   furosemide (LASIX) 40 MG tablet    Sig: Take 1.5 tablets (60 mg total) by mouth 2 (two) times daily.    Dispense:  135 tablet    Refill:  3    Patient Instructions  Medication Instructions:  Your physician has recommended you make the following change in your medication:  INCREASE LASIX TO 60 MG TWICE DAILY.  *If you need a refill on your cardiac medications before your next appointment, please call your pharmacy*   Lab Work: TODAY: STAT BMET, CBC, STAT BNP If you have labs (blood work)  drawn today and your tests are completely normal, you will receive your results only by: [place] (if you have MyChart) OR A paper copy in the mail If you have any lab test that is abnormal or we need to change your treatment, we will call you to review the results.   Testing/Procedures:  [facility] OFFICE [place], [place] [address] Dept: [phone] Loc: [place]  09/22/2021  You are scheduled for a Cardiac Catheterization on Wednesday, August 9 with Dr. Lenna Sciara.  1. Please arrive at the Main Entrance A at [facility]: [place], [place] [address] at 12:30 PM (This time is two hours before your procedure to ensure your preparation). Free valet parking service is available.   Special note: Every effort is made to have your procedure done on time. Please understand that emergencies sometimes delay scheduled procedures.  2. Diet: Do not eat solid foods after midnight.  You may have clear liquids until 5 AM upon the day of the procedure.  3. Labs: You will need to have blood drawn on TODAY 4. Medication instructions in preparation for your procedure:   Contrast Allergy: No  Stop taking, Lasix (Furosemide)  Wednesday, August 9,  On the morning of your procedure, take Aspirin and any morning medicines NOT listed above.  You may use sips of water.  5. Plan to go home the same day, you will only stay overnight if medically necessary. 6. You MUST have a responsible adult to drive you home. 7. An adult MUST be with you the first 24 hours after you arrive home. 8. Bring a current list of your medications, and the last time and date medication taken. 9. Bring ID and current insurance cards. 10.Please wear clothes that are easy to get on and off and wear slip-on shoes.  Thank you for allowing [place] to care for you!   -- [facility]    Follow-Up: At [facility], you and your health needs are our priority.  As part of our continuing mission to provide you with exceptional heart care, we have created designated Provider Care Teams.  These Care Teams include your primary Cardiologist (physician) and Advanced Practice Providers (APPs -  Physician Assistants and Nurse Practitioners) who all work together to provide you with the care you need, when you need it.  We recommend signing up for the patient portal called "MyChart".  Sign up information is provided on this After Visit Summary.  MyChart is used to connect with patients for Virtual Visits (Telemedicine).  Patients are able to view lab/test results, encounter notes, upcoming appointments, etc.  Non-urgent messages can be sent to your provider as well.   To learn more about what you can do with MyChart, go to [email or web address].    Your next appointment:   KEEP SCHEDULED FOLLOW-UP  Important Information About Sugar         Signed, Kathyrn Drown, NP  09/22/2021 1:39 PM    [facility]

## 2021-09-22 NOTE — Progress Notes (Signed)
Pre Surgical Assessment: 5 M Walk Test  55M=16.9f  5 Meter Walk Test- trial 1: 9.20 seconds 5 Meter Walk Test- trial 2: 8.59 seconds 5 Meter Walk Test- trial 3: 8.89 seconds 5 Meter Walk Test Average: 8.89 seconds

## 2021-09-22 NOTE — Progress Notes (Signed)
HEART AND [place]                                     Cardiology Office Note:    Date:  09/22/2021   ID:  Luis Herrera, DOB 12-Oct-1940, MRN [id number]  PCP:  [name], MD  [facility] Cardiologist:  [name], MD  [facility] Electrophysiologist:  None   Referring MD: [name], MD   Chief Complaint  Patient presents with   Follow-up    CHF    History of Present Illness:    Luis Herrera is a 81 y.o. 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   [name] is followed by Dr. Harrington Challenger for his cardiology care. He underwent CABGx1 with AVR in 2009 while still living in [place]. His symptoms were similar at that time with SOB and fatigue prompting a cardiac catheterization and subsequent surgery. After moving to [place], he was followed at [facility] however transitioned to [facility] in 2015. His AVR has been followed with surveillance echocardiograms with stable gradients noted, mostly ranging in the 18-22 mmHg range. Echocardiogram from 12/2019 showed an LVEF at 55-60% with an unknown size bioprosthetic valve present in the aortic position with a mean gradient measuring 18.0 mmHg.    More recently he has been having issues with worsening SOB and fluid volume overload. He was seen by Dr. Harrington Challenger 04/2021 at which time a BNP was obtained which was noted to be elevated at 5716. His Lasix was increased (68m BID alternating with 60/4326m and BNP was repeated. This remained elevated however was somewhat improved to 1852. He he states that over the last week or so, his exertional SOB has worsened and he has noticed increased fatigue with simple activities. This prompted an appointment 09/18/21 with Dr. RoHarrington ChallengerHis BNP was noted to be elevated once again  despite increased Lasix and HF compliance. An echocardiogram was obtained which showed normal LVEF and increased AV gradients with a mean at 38 mmHg and peak at 69.26m68m, and AVA by VTI at 0.87. Given this, Dr. RosHarrington Challengerlt he needed to be admitted for CHF with TAVR workup.    On admission, he was treated with IV Lasix with resolution of his LE edema. He was seen by the Structural Heart team and underwent Pre TAVR CT imaging. Given his response to Lasix, plan will be follow up with Dr. ThuAli Lowe18/23 at 11am to further discuss pre TAVR cardiac catheterization and surgical evaluation. His home Lasix was increased to 62m23mily.   Unfortunately he called our office this morning with complaints of weight gain, orthopnea, and exertional SOB over the weekend. He watches his weight closely and reports he was amitted with a weight at 217lb and decreased to 215lb. By this morning he was up to 219lb and 221lb in our office. His LE are very edematous on exam with 3+ pitting edema however overall appears stable. He states that since hospital discharge, he has been unable to lie flat and has been coughing more than usual. He has been complaint with his Lasix and follows a low sodium diet. He has no chest pain, palpitations, dizziness, bleeding in stool  or urine, no syncopal or pre-syncopal symptoms.   Past Medical History:  Diagnosis Date   Aortic stenosis 2008   Arthritis    Blood transfusion    Blood transfusion without reported diagnosis    CAD (coronary artery disease) 2008   Cataract    Complication of anesthesia    hallucinated once   H/O hiatal hernia    Heart murmur    AFTER REPLACED VALVE WENT AWAY   Hx of colonic polyps    Hyperlipidemia    Hypertension    Thyroid disease     Past Surgical History:  Procedure Laterality Date   AORTIC VALVE REPLACEMENT  2009   Bioprosthetic at [place]  2008   [place] GRAFT  2009   SVG-PDA w/ AVR   CORRECTION HAMMER TOE  2011   BILATERAL   GASTROCNEMIUS RECESSION  05/28/2011   POLYPECTOMY     TONSILLECTOMY     TOTAL HIP ARTHROPLASTY Left 2000   TOTAL KNEE ARTHROPLASTY  2000   left   TOTAL KNEE ARTHROPLASTY  12/01/2011   Procedure: TOTAL KNEE ARTHROPLASTY;  Surgeon: Mauri Pole, MD;  Location: [facility];  Service: Orthopedics;  Laterality: Right;   WEIL OSTEOTOMY  05/28/2011    Current Medications: Current Meds  Medication Sig   acetaminophen (TYLENOL) 500 MG tablet Take 1,000 mg by mouth daily as needed.   amiodarone (PACERONE) 200 MG tablet TAKE ONE-HALF TABLET BY MOUTH  DAILY (Patient taking differently: Take 100 mg by mouth daily.)   amLODipine (NORVASC) 5 MG tablet Take 1 tablet (5 mg total) by mouth daily.   aspirin EC 81 MG tablet Take 1 tablet (81 mg total) by mouth daily. Swallow whole.   atorvastatin (LIPITOR) 80 MG tablet TAKE 1 TABLET BY MOUTH ONCE  DAILY   carvedilol (COREG) 6.25 MG tablet TAKE 1 TABLET BY MOUTH  TWICE DAILY   ezetimibe (ZETIA) 10 MG tablet TAKE 1 TABLET BY MOUTH DAILY   furosemide (LASIX) 40 MG tablet Take 1.5 tablets (60 mg total) by mouth 2 (two) times daily.   levothyroxine (SYNTHROID) 50 MCG tablet TAKE 1 TABLET BY MOUTH DAILY  BEFORE BREAKFAST   sacubitril-valsartan (ENTRESTO) 49-51 MG Take 1 tablet by mouth 2 (two) times daily.   tamsulosin (FLOMAX) 0.4 MG CAPS capsule Take 0.4 mg by mouth daily.   [DISCONTINUED] furosemide (LASIX) 20 MG tablet Take 3 tablets (60 mg total) by mouth daily.   [DISCONTINUED] potassium chloride SA (KLOR-CON) 20 MEQ tablet Take 3 tablets the first day, then take one tablet daily     Allergies:   Sulfa antibiotics and Bactrim [sulfamethoxazole-trimethoprim]   Social History   Socioeconomic History   Marital status: Married    Spouse name: Not on file   Number of children: Not on file   Years of education: Not on file   Highest education level: Not on  file  Occupational History   Occupation: retired  Tobacco Use   Smoking status: Former    Types: Cigarettes    Quit date: 05/25/1973    Years since quitting: 48.3   Smokeless tobacco: Never  Vaping Use   Vaping Use: Never used  Substance and Sexual Activity   Alcohol use: Not Currently    Comment: rare   Drug use: No   Sexual activity: Not Currently  Other Topics Concern   Not on file  Social History Narrative   Regular exercise-yes. Pt lives in [place] with his wife.   Social Determinants of Health   Financial Resource Strain: Not on file  Food Insecurity: Not on file  Transportation Needs: Not on file  Physical Activity: Not on file  Stress: Not on file  Social Connections: Not on file     Family History: The patient's family history includes Colon cancer (age of onset: 28) in his mother; Heart attack in his brother and father; Hyperlipidemia in an other family member; Hypertension in his brother, sister, and another family member. There is no history of Stroke, Esophageal cancer, Rectal cancer, or Stomach cancer.  ROS:   Please see the history of present illness.    All other systems reviewed and are negative.  EKGs/Labs/Other Studies Reviewed:    The following studies were reviewed today:  Pre TAVR CT imaging: Performed 09/19/21 with pending results.  _____________   Echocardiogram 09/18/21:   Normal LV function; s/p AVR with elevated mean gradient of 38 mmHg and peak velocity of 4.3 m/s suggestive of severe AS; AI appears mild but pressure half time of 156 suggests possibly more significant; gradient worse compared to 01/09/20; suggest TEE to better assess prosthetic aortic valve. 1. Left ventricular ejection fraction, by estimation, is 60 to 65%. The left ventricle has normal function. The left ventricle has no regional wall motion abnormalities. The left ventricular internal cavity size was mildly dilated. Left ventricular diastolic  parameters are consistent with Grade II diastolic dysfunction (pseudonormalization). 2. Right ventricular systolic function is normal. The right ventricular size is mildly enlarged. 3. 4. Left atrial size was severely dilated. The mitral valve is normal in structure. Mild mitral valve regurgitation. No evidence of mitral stenosis. 5. The aortic valve has been repaired/replaced. Aortic valve regurgitation is mild. Severe aortic valve stenosis. There is a unknown bioprosthetic valve present in the aortic position. Procedure Date: 2009. 6. The inferior vena cava is normal in size with greater than 50% respiratory variability, suggesting right atrial pressure of 3 mmHg.   Echocardiogram 01/09/2020:    1. Left ventricular ejection fraction, by estimation, is 55 to 60%. The left ventricle has normal function. The left ventricle demonstrates regional wall motion abnormalities (see scoring diagram/findings for description). The left ventricular internal cavity size was mildly dilated. Left ventricular diastolic parameters are consistent with Grade II diastolic dysfunction (pseudonormalization). 2. Right ventricular systolic function is mildly reduced. The right ventricular size is mildly enlarged. There is normal pulmonary artery systolic pressure. The estimated right ventricular systolic pressure is 33.2 mmHg. 3. The mitral valve is degenerative. Mild mitral valve regurgitation. The mean mitral valve gradient is 5.0 mmHg with average heart rate of 61 bpm in the setting of mild-moderate MAC. 4. Aortic valve regurgitation is mild. There is a unknown size bioprosthetic valve present in the aortic position, LVOT diameter is 2.6 cm. Procedure Date: 2009. Aortic valve mean gradient measures 18.0 mmHg. Grossly stable appearance of aortic valve prosthesis compared to 04/13/2018 exam. See findings for calculations. 5. Aortic dilatation noted. There is mild dilatation of the ascending aorta, measuring  42 mm. 6. The inferior vena cava is dilated in size with >50% respiratory variability, suggesting right atrial pressure of 8 mmHg. Comparison(s): 09/13/19 EF 60-65%. AV 42mHg mean PG.  EKG:  EKG is not ordered today.    Recent Labs: 09/02/2021: NT-Pro BNP 3,503 09/18/2021: ALT 14; B Natriuretic Peptide 2,254.9; BUN 31; Creatinine, Ser 1.10; Hemoglobin 10.9; Platelets 160; Potassium 4.1;  Sodium 145; TSH 3.605   Recent Lipid Panel    Component Value Date/Time   CHOL 150 12/18/2019 1005   TRIG 96 12/18/2019 1005   HDL 54 12/18/2019 1005   CHOLHDL 2.8 12/18/2019 1005   CHOLHDL 3 11/14/2015 0954   VLDL 16.8 11/14/2015 0954   LDLCALC 78 12/18/2019 1005   Physical Exam:    VS:  BP (!) 120/40 (BP Location: Left Arm, Patient Position: Sitting, Cuff Size: Normal)   Pulse 76   Ht _0  (1.778 m)   Wt 221 lb (100.2 kg)   SpO2 96%   BMI 31.71 kg/m     Wt Readings from Last 3 Encounters:  09/22/21 221 lb (100.2 kg)  09/19/21 215 lb 1.6 oz (97.6 kg)  09/08/21 214 lb (97.1 kg)    General: Well developed, well nourished, NAD Neck: Negative for carotid bruits. No JVD Lungs:Clear to ausculation bilaterally, diminished in bilateral LL. No wheezes. Breathing is unlabored. Cardiovascular: RRR with S1 S2. Harsh systolic murmurs Abdomen: Soft, non-tender. No obvious abdominal masses. Extremities: 3+ BLE edema.  Neuro: Alert and oriented. No focal deficits. No facial asymmetry. MAE spontaneously. Psych: Responds to questions appropriately with normal affect.    ASSESSMENT/PLAN:    Severe AS with recurrent acute diastolic CHF: Patient has been followed by Dr. Harrington Challenger for CHF symptoms with diuretic adjustments. Echocardiogram performed 09/18/21 showed normal LV function s/p AVR with elevated mean gradient of 38 mmHg and peak velocity of 4.3 m/s suggestive of severe AS. He was admitted to the hospital and treated with IV Lasix x2 dosing with improvement. Pre TAVR CT imaging performed and is under review  at this time. Plan was for OP follow up with Dr. Ali Lowe for cath and surgical OP appointment. Called the office with weight gain and SOB. Weight is up to 221lb today>>215lb on Friday. Lasix was increased to 25m daily at discharge (from 491mdaily). Will increase Lasix to 6015mID for now and obtain BMET, BNP, CBC today. May need further adjustment to 65m18mD or transition to torsemide based on response and lab work. Plan for cardiac catheterization with Dr. ThukAli Lowenesday 09/24/21. May need IV Lasix at that time.   The patient understands that risks include but are not limited to stroke (1 in 1000), death (1 in 100026idney failure [usually temporary] (1 in 500), bleeding (1 in 200), allergic reaction [possibly serious] (1 in 200), and agrees to proceed.    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 Continue current regimen  HTN: Stable with no changes needed today  PVCs: Controlled on Amiodarone, follows with Dr. TaylLovena LeMedication Adjustments/Labs and Tests Ordered: Current medicines are reviewed at length with the patient today.  Concerns regarding medicines are outlined above.   Orders Placed This Encounter  Procedures   CBC   Pro b natriuretic peptide (BNP)   Basic metabolic panel   Meds ordered this encounter  Medications   potassium chloride SA (KLOR-CON M) 20 MEQ tablet    Sig: Take 1 tablet (20 mEq total) by mouth daily.    Dispense:  90 tablet    Refill:  3   furosemide (LASIX) 40 MG tablet    Sig: Take 1.5 tablets (60 mg total) by mouth 2 (two) times daily.    Dispense:  135 tablet    Refill:  3    Patient Instructions  Medication Instructions:  Your physician has recommended you make the following change in your medication:  INCREASE LASIX TO 60 MG TWICE DAILY.  *If you need a refill on your cardiac medications before your next appointment, please call your pharmacy*   Lab Work: TODAY: STAT BMET, CBC, STAT BNP If you have labs (blood work)  drawn today and your tests are completely normal, you will receive your results only by: [place] (if you have MyChart) OR A paper copy in the mail If you have any lab test that is abnormal or we need to change your treatment, we will call you to review the results.   Testing/Procedures:  [facility] OFFICE [place], [place]  [place] [address] Dept: [phone] Loc: [place]  09/22/2021  You are scheduled for a Cardiac Catheterization on Wednesday, August 9 with Dr. Lenna Sciara.  1. Please arrive at the Main Entrance A at [facility]: [place], [place] [address] at 12:30 PM (This time is two hours before your procedure to ensure your preparation). Free valet parking service is available.   Special note: Every effort is made to have your procedure done on time. Please understand that emergencies sometimes delay scheduled procedures.  2. Diet: Do not eat solid foods after midnight.  You may have clear liquids until 5 AM upon the day of the procedure.  3. Labs: You will need to have blood drawn on TODAY 4. Medication instructions in preparation for your procedure:   Contrast Allergy: No  Stop taking, Lasix (Furosemide)  Wednesday, August 9,  On the morning of your procedure, take Aspirin and any morning medicines NOT listed above.  You may use sips of water.  5. Plan to go home the same day, you will only stay overnight if medically necessary. 6. You MUST have a responsible adult to drive you home. 7. An adult MUST be with you the first 24 hours after you arrive home. 8. Bring a current list of your medications, and the last time and date medication taken. 9. Bring ID and current insurance cards. 10.Please wear clothes that are easy to get on and off and wear slip-on shoes.  Thank you for allowing [place] to care for you!   -- [facility]    Follow-Up: At [facility], you and your health needs are our priority.  As part of our continuing mission to provide you with exceptional heart care, we have created designated Provider Care Teams.  These Care Teams include your primary Cardiologist (physician) and Advanced Practice Providers (APPs -  Physician Assistants and Nurse Practitioners) who all work together to provide you with the care you need, when you need it.  We recommend signing up for the patient portal called "MyChart".  Sign up information is provided on this After Visit Summary.  MyChart is used to connect with patients for Virtual Visits (Telemedicine).  Patients are able to view lab/test results, encounter notes, upcoming appointments, etc.  Non-urgent messages can be sent to your provider as well.   To learn more about what you can do with MyChart, go to [email or web address].    Your next appointment:   KEEP SCHEDULED FOLLOW-UP  Important Information About Sugar         Signed, Kathyrn Drown, NP  09/22/2021 1:39 PM    [facility]

## 2021-09-23 ENCOUNTER — Telehealth: Payer: Self-pay | Admitting: Cardiology

## 2021-09-23 NOTE — Telephone Encounter (Signed)
  HEART AND VASCULAR CENTER   MULTIDISCIPLINARY HEART VALVE TEAM   Called to speak with the patient about the potential for hospital readmission after cardiac catheterization with Dr. Ali Lowe tomorrow, 09/24/21 for CHF optimization with IV diuretics pre to TAVR 09/30/21. He understands this may be the safest plan prior to his procedure. All questions answered.   Kathyrn Drown NP-C Structural Heart Team  Pager: [phone] Phone: [phone]

## 2021-09-24 ENCOUNTER — Encounter (HOSPITAL_COMMUNITY)
Admission: RE | Disposition: A | Discharge status: Home / Self Care | Payer: Self-pay | Source: Home / Self Care | Attending: Internal Medicine

## 2021-09-24 ENCOUNTER — Inpatient Hospital Stay (HOSPITAL_COMMUNITY)
Admission: RE | Admit: 2021-09-24 | Discharge: 2021-10-01 | DRG: 266 | Disposition: A | Discharge status: Home / Self Care | Payer: Medicare Other | Attending: Internal Medicine | Admitting: Internal Medicine

## 2021-09-24 ENCOUNTER — Other Ambulatory Visit: Payer: Self-pay

## 2021-09-24 DIAGNOSIS — I2510 Atherosclerotic heart disease of native coronary artery without angina pectoris: Secondary | ICD-10-CM | POA: Diagnosis present

## 2021-09-24 DIAGNOSIS — Z9049 Acquired absence of other specified parts of digestive tract: Secondary | ICD-10-CM

## 2021-09-24 DIAGNOSIS — I350 Nonrheumatic aortic (valve) stenosis: Secondary | ICD-10-CM | POA: Diagnosis not present

## 2021-09-24 DIAGNOSIS — Z87891 Personal history of nicotine dependence: Secondary | ICD-10-CM | POA: Diagnosis not present

## 2021-09-24 DIAGNOSIS — Z79890 Hormone replacement therapy (postmenopausal): Secondary | ICD-10-CM

## 2021-09-24 DIAGNOSIS — Z6831 Body mass index (BMI) 31.0-31.9, adult: Secondary | ICD-10-CM

## 2021-09-24 DIAGNOSIS — I708 Atherosclerosis of other arteries: Secondary | ICD-10-CM | POA: Diagnosis not present

## 2021-09-24 DIAGNOSIS — Z952 Presence of prosthetic heart valve: Secondary | ICD-10-CM | POA: Diagnosis not present

## 2021-09-24 DIAGNOSIS — T82857A Stenosis of cardiac prosthetic devices, implants and grafts, initial encounter: Principal | ICD-10-CM | POA: Diagnosis present

## 2021-09-24 DIAGNOSIS — I110 Hypertensive heart disease with heart failure: Secondary | ICD-10-CM | POA: Diagnosis present

## 2021-09-24 DIAGNOSIS — Z96642 Presence of left artificial hip joint: Secondary | ICD-10-CM | POA: Diagnosis present

## 2021-09-24 DIAGNOSIS — M1990 Unspecified osteoarthritis, unspecified site: Secondary | ICD-10-CM | POA: Diagnosis present

## 2021-09-24 DIAGNOSIS — E669 Obesity, unspecified: Secondary | ICD-10-CM | POA: Diagnosis present

## 2021-09-24 DIAGNOSIS — Z881 Allergy status to other antibiotic agents status: Secondary | ICD-10-CM

## 2021-09-24 DIAGNOSIS — Y831 Surgical operation with implant of artificial internal device as the cause of abnormal reaction of the patient, or of later complication, without mention of misadventure at the time of the procedure: Secondary | ICD-10-CM | POA: Diagnosis present

## 2021-09-24 DIAGNOSIS — I4510 Unspecified right bundle-branch block: Secondary | ICD-10-CM | POA: Diagnosis not present

## 2021-09-24 DIAGNOSIS — Z006 Encounter for examination for normal comparison and control in clinical research program: Secondary | ICD-10-CM

## 2021-09-24 DIAGNOSIS — N179 Acute kidney failure, unspecified: Secondary | ICD-10-CM | POA: Diagnosis not present

## 2021-09-24 DIAGNOSIS — I5031 Acute diastolic (congestive) heart failure: Secondary | ICD-10-CM | POA: Diagnosis not present

## 2021-09-24 DIAGNOSIS — Z951 Presence of aortocoronary bypass graft: Secondary | ICD-10-CM | POA: Diagnosis not present

## 2021-09-24 DIAGNOSIS — E785 Hyperlipidemia, unspecified: Secondary | ICD-10-CM | POA: Diagnosis not present

## 2021-09-24 DIAGNOSIS — I5033 Acute on chronic diastolic (congestive) heart failure: Secondary | ICD-10-CM | POA: Diagnosis not present

## 2021-09-24 DIAGNOSIS — I493 Ventricular premature depolarization: Secondary | ICD-10-CM | POA: Diagnosis present

## 2021-09-24 DIAGNOSIS — I10 Essential (primary) hypertension: Secondary | ICD-10-CM | POA: Diagnosis present

## 2021-09-24 DIAGNOSIS — Z20822 Contact with and (suspected) exposure to covid-19: Secondary | ICD-10-CM | POA: Diagnosis present

## 2021-09-24 DIAGNOSIS — Z8249 Family history of ischemic heart disease and other diseases of the circulatory system: Secondary | ICD-10-CM

## 2021-09-24 DIAGNOSIS — D638 Anemia in other chronic diseases classified elsewhere: Secondary | ICD-10-CM | POA: Diagnosis not present

## 2021-09-24 DIAGNOSIS — Z96653 Presence of artificial knee joint, bilateral: Secondary | ICD-10-CM | POA: Diagnosis present

## 2021-09-24 DIAGNOSIS — R0600 Dyspnea, unspecified: Secondary | ICD-10-CM | POA: Diagnosis present

## 2021-09-24 DIAGNOSIS — Z01818 Encounter for other preprocedural examination: Secondary | ICD-10-CM | POA: Diagnosis not present

## 2021-09-24 DIAGNOSIS — Z79899 Other long term (current) drug therapy: Secondary | ICD-10-CM | POA: Diagnosis not present

## 2021-09-24 DIAGNOSIS — Z7982 Long term (current) use of aspirin: Secondary | ICD-10-CM

## 2021-09-24 DIAGNOSIS — J90 Pleural effusion, not elsewhere classified: Secondary | ICD-10-CM | POA: Diagnosis not present

## 2021-09-24 DIAGNOSIS — I5030 Unspecified diastolic (congestive) heart failure: Secondary | ICD-10-CM | POA: Diagnosis present

## 2021-09-24 DIAGNOSIS — Z6829 Body mass index (BMI) 29.0-29.9, adult: Secondary | ICD-10-CM

## 2021-09-24 DIAGNOSIS — E039 Hypothyroidism, unspecified: Secondary | ICD-10-CM | POA: Diagnosis not present

## 2021-09-24 DIAGNOSIS — Z882 Allergy status to sulfonamides status: Secondary | ICD-10-CM

## 2021-09-24 DIAGNOSIS — J9811 Atelectasis: Secondary | ICD-10-CM | POA: Diagnosis not present

## 2021-09-24 HISTORY — PX: RIGHT/LEFT HEART CATH AND CORONARY/GRAFT ANGIOGRAPHY: CATH118267

## 2021-09-24 LAB — POCT I-STAT EG7
Acid-Base Excess: 0 mmol/L (ref 0.0–2.0)
Acid-Base Excess: 1 mmol/L (ref 0.0–2.0)
Bicarbonate: 25 mmol/L (ref 20.0–28.0)
Bicarbonate: 26 mmol/L (ref 20.0–28.0)
Calcium, Ion: 1.09 mmol/L — ABNORMAL LOW (ref 1.15–1.40)
Calcium, Ion: 1.15 mmol/L (ref 1.15–1.40)
HCT: 31 % — ABNORMAL LOW (ref 39.0–52.0)
HCT: 31 % — ABNORMAL LOW (ref 39.0–52.0)
Hemoglobin: 10.5 g/dL — ABNORMAL LOW (ref 13.0–17.0)
Hemoglobin: 10.5 g/dL — ABNORMAL LOW (ref 13.0–17.0)
O2 Saturation: 68 %
O2 Saturation: 70 %
Potassium: 3.3 mmol/L — ABNORMAL LOW (ref 3.5–5.1)
Potassium: 3.5 mmol/L (ref 3.5–5.1)
Sodium: 146 mmol/L — ABNORMAL HIGH (ref 135–145)
Sodium: 146 mmol/L — ABNORMAL HIGH (ref 135–145)
TCO2: 26 mmol/L (ref 22–32)
TCO2: 27 mmol/L (ref 22–32)
pCO2, Ven: 40.1 mmHg — ABNORMAL LOW (ref 44–60)
pCO2, Ven: 41.7 mmHg — ABNORMAL LOW (ref 44–60)
pH, Ven: 7.403 (ref 7.25–7.43)
pH, Ven: 7.404 (ref 7.25–7.43)
pO2, Ven: 35 mmHg (ref 32–45)
pO2, Ven: 36 mmHg (ref 32–45)

## 2021-09-24 LAB — CBC
HCT: 33.3 % — ABNORMAL LOW (ref 39.0–52.0)
Hemoglobin: 10.9 g/dL — ABNORMAL LOW (ref 13.0–17.0)
MCH: 31.8 pg (ref 26.0–34.0)
MCHC: 32.7 g/dL (ref 30.0–36.0)
MCV: 97.1 fL (ref 80.0–100.0)
Platelets: 207 10*3/uL (ref 150–400)
RBC: 3.43 MIL/uL — ABNORMAL LOW (ref 4.22–5.81)
RDW: 14.6 % (ref 11.5–15.5)
WBC: 7.4 10*3/uL (ref 4.0–10.5)
nRBC: 0 % (ref 0.0–0.2)

## 2021-09-24 LAB — BASIC METABOLIC PANEL
Anion gap: 7 (ref 5–15)
BUN: 28 mg/dL — ABNORMAL HIGH (ref 8–23)
CO2: 26 mmol/L (ref 22–32)
Calcium: 8.2 mg/dL — ABNORMAL LOW (ref 8.9–10.3)
Chloride: 110 mmol/L (ref 98–111)
Creatinine, Ser: 1.29 mg/dL — ABNORMAL HIGH (ref 0.61–1.24)
GFR, Estimated: 56 mL/min — ABNORMAL LOW (ref >60)
Glucose, Bld: 109 mg/dL — ABNORMAL HIGH (ref 70–99)
Potassium: 4 mmol/L (ref 3.5–5.1)
Sodium: 143 mmol/L (ref 135–145)

## 2021-09-24 LAB — POCT I-STAT 7, (LYTES, BLD GAS, ICA,H+H)
Acid-Base Excess: 0 mmol/L (ref 0.0–2.0)
Bicarbonate: 24 mmol/L (ref 20.0–28.0)
Calcium, Ion: 1.16 mmol/L (ref 1.15–1.40)
HCT: 32 % — ABNORMAL LOW (ref 39.0–52.0)
Hemoglobin: 10.9 g/dL — ABNORMAL LOW (ref 13.0–17.0)
O2 Saturation: 92 %
Potassium: 3.5 mmol/L (ref 3.5–5.1)
Sodium: 144 mmol/L (ref 135–145)
TCO2: 25 mmol/L (ref 22–32)
pCO2 arterial: 36.3 mmHg (ref 32–48)
pH, Arterial: 7.428 (ref 7.35–7.45)
pO2, Arterial: 63 mmHg — ABNORMAL LOW (ref 83–108)

## 2021-09-24 LAB — CREATININE, SERUM
Creatinine, Ser: 1.25 mg/dL — ABNORMAL HIGH (ref 0.61–1.24)
GFR, Estimated: 58 mL/min — ABNORMAL LOW (ref >60)

## 2021-09-24 SURGERY — RIGHT/LEFT HEART CATH AND CORONARY/GRAFT ANGIOGRAPHY
Anesthesia: LOCAL

## 2021-09-24 MED ORDER — SODIUM CHLORIDE 0.9% FLUSH: 3.0000 mL | INTRAVENOUS | Status: DC | PRN

## 2021-09-24 MED ORDER — SACUBITRIL-VALSARTAN 49-51 MG PO TABS
1.0000 | ORAL_TABLET | Freq: Two times a day (BID) | ORAL | Status: DC
  Administered 2021-09-24 – 2021-09-29 (×11): 1 via ORAL
  Filled 2021-09-24 (×12): qty 1

## 2021-09-24 MED ORDER — SODIUM CHLORIDE 0.9 % IV SOLN: INTRAVENOUS | Status: DC

## 2021-09-24 MED ORDER — ONDANSETRON HCL 4 MG/2ML IJ SOLN: 4.0000 mg | Freq: Four times a day (QID) | INTRAMUSCULAR | Status: DC | PRN

## 2021-09-24 MED ORDER — FENTANYL CITRATE (PF) 100 MCG/2ML IJ SOLN
INTRAMUSCULAR | Status: DC | PRN
  Administered 2021-09-24: 25 ug via INTRAVENOUS

## 2021-09-24 MED ORDER — LIDOCAINE HCL (PF) 1 % IJ SOLN
INTRAMUSCULAR | Status: DC | PRN
  Administered 2021-09-24 (×2): 2 mL

## 2021-09-24 MED ORDER — CARVEDILOL 6.25 MG PO TABS
6.2500 mg | ORAL_TABLET | Freq: Two times a day (BID) | ORAL | Status: DC
  Administered 2021-09-24 – 2021-09-29 (×11): 6.25 mg via ORAL
  Filled 2021-09-24 (×11): qty 1

## 2021-09-24 MED ORDER — HEPARIN SODIUM (PORCINE) 5000 UNIT/ML IJ SOLN
5000.0000 [IU] | Freq: Three times a day (TID) | INTRAMUSCULAR | Status: DC
  Administered 2021-09-24 – 2021-10-01 (×19): 5000 [IU] via SUBCUTANEOUS
  Filled 2021-09-24 (×19): qty 1

## 2021-09-24 MED ORDER — SODIUM CHLORIDE 0.9 % WEIGHT BASED INFUSION: 1.0000 mL/kg/h | INTRAVENOUS | Status: DC

## 2021-09-24 MED ORDER — MIDAZOLAM HCL 2 MG/2ML IJ SOLN
INTRAMUSCULAR | Status: DC | PRN
  Administered 2021-09-24: 1 mg via INTRAVENOUS

## 2021-09-24 MED ORDER — POTASSIUM CHLORIDE CRYS ER 20 MEQ PO TBCR
20.0000 meq | EXTENDED_RELEASE_TABLET | Freq: Every day | ORAL | Status: DC
  Administered 2021-09-25 – 2021-09-29 (×5): 20 meq via ORAL
  Filled 2021-09-24 (×6): qty 1

## 2021-09-24 MED ORDER — AMLODIPINE BESYLATE 5 MG PO TABS
5.0000 mg | ORAL_TABLET | Freq: Every day | ORAL | Status: DC
  Administered 2021-09-25 – 2021-09-29 (×5): 5 mg via ORAL
  Filled 2021-09-24 (×5): qty 1

## 2021-09-24 MED ORDER — HEPARIN (PORCINE) IN NACL 1000-0.9 UT/500ML-% IV SOLN
INTRAVENOUS | Status: DC | PRN
  Administered 2021-09-24 (×2): 500 mL

## 2021-09-24 MED ORDER — SODIUM CHLORIDE 0.9 % IV SOLN: 250.0000 mL | INTRAVENOUS | Status: DC | PRN

## 2021-09-24 MED ORDER — SODIUM CHLORIDE 0.9 % WEIGHT BASED INFUSION: 3.0000 mL/kg/h | INTRAVENOUS | Status: DC

## 2021-09-24 MED ORDER — VERAPAMIL HCL 2.5 MG/ML IV SOLN
INTRAVENOUS | Status: AC
Start: 2021-09-24 — End: ?
  Filled 2021-09-24: qty 2

## 2021-09-24 MED ORDER — ONDANSETRON HCL 4 MG/2ML IJ SOLN
4.0000 mg | Freq: Four times a day (QID) | INTRAMUSCULAR | Status: DC | PRN
  Administered 2021-09-30: 4 mg via INTRAVENOUS

## 2021-09-24 MED ORDER — HEPARIN (PORCINE) IN NACL 1000-0.9 UT/500ML-% IV SOLN
INTRAVENOUS | Status: AC
  Filled 2021-09-24: qty 1000

## 2021-09-24 MED ORDER — VERAPAMIL HCL 2.5 MG/ML IV SOLN
INTRAVENOUS | Status: DC | PRN
  Administered 2021-09-24: 10 mL via INTRA_ARTERIAL

## 2021-09-24 MED ORDER — HEPARIN SODIUM (PORCINE) 1000 UNIT/ML IJ SOLN
INTRAMUSCULAR | Status: AC
  Filled 2021-09-24: qty 10

## 2021-09-24 MED ORDER — EZETIMIBE 10 MG PO TABS
10.0000 mg | ORAL_TABLET | Freq: Every day | ORAL | Status: DC
  Administered 2021-09-25 – 2021-09-29 (×5): 10 mg via ORAL
  Filled 2021-09-24 (×6): qty 1

## 2021-09-24 MED ORDER — IOHEXOL 350 MG/ML SOLN
INTRAVENOUS | Status: DC | PRN
  Administered 2021-09-24: 45 mL

## 2021-09-24 MED ORDER — ACETAMINOPHEN 325 MG PO TABS: 650.0000 mg | ORAL_TABLET | ORAL | Status: DC | PRN

## 2021-09-24 MED ORDER — MIDAZOLAM HCL 2 MG/2ML IJ SOLN
INTRAMUSCULAR | Status: AC
  Filled 2021-09-24: qty 2

## 2021-09-24 MED ORDER — ASPIRIN 81 MG PO TBEC
81.0000 mg | DELAYED_RELEASE_TABLET | Freq: Every day | ORAL | Status: DC
  Administered 2021-09-25 – 2021-09-29 (×5): 81 mg via ORAL
  Filled 2021-09-24 (×5): qty 1

## 2021-09-24 MED ORDER — HYDRALAZINE HCL 20 MG/ML IJ SOLN: 10.0000 mg | INTRAMUSCULAR | Status: AC | PRN

## 2021-09-24 MED ORDER — ATORVASTATIN CALCIUM 80 MG PO TABS
80.0000 mg | ORAL_TABLET | Freq: Every day | ORAL | Status: DC
  Administered 2021-09-24 – 2021-10-01 (×7): 80 mg via ORAL
  Filled 2021-09-24 (×7): qty 1

## 2021-09-24 MED ORDER — SODIUM CHLORIDE 0.9% FLUSH: 3.0000 mL | Freq: Two times a day (BID) | INTRAVENOUS | Status: DC

## 2021-09-24 MED ORDER — LEVOTHYROXINE SODIUM 50 MCG PO TABS
50.0000 ug | ORAL_TABLET | Freq: Every day | ORAL | Status: DC
  Administered 2021-09-25 – 2021-09-30 (×6): 50 ug via ORAL
  Filled 2021-09-24 (×6): qty 1

## 2021-09-24 MED ORDER — ASPIRIN 81 MG PO CHEW: 81.0000 mg | CHEWABLE_TABLET | ORAL | Status: DC

## 2021-09-24 MED ORDER — LABETALOL HCL 5 MG/ML IV SOLN: 10.0000 mg | INTRAVENOUS | Status: AC | PRN

## 2021-09-24 MED ORDER — TAMSULOSIN HCL 0.4 MG PO CAPS
0.4000 mg | ORAL_CAPSULE | Freq: Every day | ORAL | Status: DC
  Administered 2021-09-25 – 2021-10-01 (×6): 0.4 mg via ORAL
  Filled 2021-09-24 (×7): qty 1

## 2021-09-24 MED ORDER — LIDOCAINE HCL (PF) 1 % IJ SOLN
INTRAMUSCULAR | Status: AC
  Filled 2021-09-24: qty 30

## 2021-09-24 MED ORDER — FENTANYL CITRATE (PF) 100 MCG/2ML IJ SOLN
INTRAMUSCULAR | Status: AC
  Filled 2021-09-24: qty 2

## 2021-09-24 MED ORDER — AMIODARONE HCL 200 MG PO TABS
100.0000 mg | ORAL_TABLET | Freq: Every day | ORAL | Status: DC
  Administered 2021-09-25 – 2021-10-01 (×6): 100 mg via ORAL
  Filled 2021-09-24 (×6): qty 1

## 2021-09-24 MED ORDER — SODIUM CHLORIDE 0.9% FLUSH
3.0000 mL | Freq: Two times a day (BID) | INTRAVENOUS | Status: DC
  Administered 2021-09-24 – 2021-09-29 (×11): 3 mL via INTRAVENOUS

## 2021-09-24 MED ORDER — HEPARIN SODIUM (PORCINE) 1000 UNIT/ML IJ SOLN
INTRAMUSCULAR | Status: DC | PRN
  Administered 2021-09-24: 5000 [IU] via INTRAVENOUS

## 2021-09-24 SURGICAL SUPPLY — 21 items
BAND ZEPHYR COMPRESS 30 LONG (HEMOSTASIS) ×1 IMPLANT
CATH BALLN WEDGE 5F 110CM (CATHETERS) ×1 IMPLANT
CATH EXPO 5F MPA-1 (CATHETERS) ×1 IMPLANT
CATH INFINITI 5 FR JR5 (CATHETERS) ×1 IMPLANT
CATH INFINITI 5FR AL1 (CATHETERS) ×1 IMPLANT
CATH INFINITI 6F ANG MULTIPACK (CATHETERS) ×1 IMPLANT
ELECT DEFIB PAD ADLT CADENCE (PAD) ×1 IMPLANT
GLIDESHEATH SLEND SS 6F .021 (SHEATH) ×1 IMPLANT
GUIDEWIRE .025 260CM (WIRE) ×1 IMPLANT
GUIDEWIRE INQWIRE 1.5J.035X260 (WIRE) IMPLANT
GUIDEWIRE SAF TJ AMPL .035X180 (WIRE) ×1 IMPLANT
GUIDEWIRE SAFE TJ AMPLATZ EXST (WIRE) ×1 IMPLANT
INQWIRE 1.5J .035X260CM (WIRE) ×2
KIT HEART LEFT (KITS) ×2 IMPLANT
PACK CARDIAC CATHETERIZATION (CUSTOM PROCEDURE TRAY) ×2 IMPLANT
SHEATH 6FR 85 DEST SLENDER (SHEATH) ×1 IMPLANT
SHEATH GLIDE SLENDER 4/5FR (SHEATH) ×1 IMPLANT
SHEATH PINNACLE 6F 10CM (SHEATH) ×1 IMPLANT
TRANSDUCER W/STOPCOCK (MISCELLANEOUS) ×2 IMPLANT
TUBING CIL FLEX 10 FLL-RA (TUBING) ×2 IMPLANT
WIRE EMERALD ST .035X150CM (WIRE) ×1 IMPLANT

## 2021-09-24 NOTE — Consult Note (Signed)
[facility]   [facility]ISCIPLIN[facility] VALVE CLINIC         [address]       [place] [address]             [phone]          CARDIOTHORACIC SURGERY CONSULTATION REPORT  PCP is [name], MD Referring Provider is Lenna Sciara, MD Primary Cardiologist is [name], MD  Reason for consultation: Severe prosthetic aortic valve stenosis  HPI:  The patient is an 81 year old gentleman who underwent aortic valve replacement using a bioprosthetic valve and coronary bypass grafting with a saphenous vein graft to the PDA in 2009 in [place].  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  He has known prosthetic valve dysfunction by prior echocardiograms that has been followed by Dr. Harrington Challenger.  His gradients have been in the range of 18 to 22 mmHg.  He says that he was feeling fine until recently when he began having worsening shortness of breath and lower extremity edema.  He was seen by Dr. Harrington Challenger in March 2023 at which time a BNP was 5716.  His Lasix was increased and his BNP decreased some to 1852.  He [place] the [place] about 1 month ago and was walking to his car and felt suddenly very fatigued and short of breath.  Over the past week or so he began having exertional shortness of breath and fatigue with any activity.  He was seen by Dr. Harrington Challenger on 09/18/2021 and his BNP was again elevated.  A 2D echocardiogram showed an increase in the prosthetic aortic valve gradient with a mean of 38 and a peak of 69.  Aortic valve area by VTI was 0.87 cm.  Left ventricular ejection fraction was normal.  He was admitted for diuresis and TAVR workup.  He improved and was discharged home but then called the cardiology office with worsening shortness of breath and increased weight gain.  Cardiac catheterization was scheduled and he was admitted afterwards for a tune up prior to TAVR.  His wife and  daughter are with him at this time.  They report that he has not been able to do anything over the past week without getting very short of breath.  He has been sleeping in a recliner due to wheezing.  Past Medical History:  Diagnosis Date   Aortic stenosis 2008   Arthritis    Blood transfusion    Blood transfusion without reported diagnosis    CAD (coronary artery disease) 2008   Cataract    Complication of anesthesia    hallucinated once   H/O hiatal hernia    Heart murmur    AFTER REPLACED VALVE WENT AWAY   Hx of colonic polyps    Hyperlipidemia    Hypertension    Thyroid disease     Past Surgical History:  Procedure Laterality Date   AORTIC VALVE REPLACEMENT  2009   Bioprosthetic at [facility] GRAFT  2009   SVG-PDA w/ AVR   CORRECTION HAMMER TOE  2011   BILATERAL   GASTROCNEMIUS RECESSION  05/28/2011   POLYPECTOMY     TONSILLECTOMY     TOTAL HIP ARTHROPLASTY Left 2000   TOTAL KNEE ARTHROPLASTY  2000   left  TOTAL KNEE ARTHROPLASTY  12/01/2011   Procedure: TOTAL KNEE ARTHROPLASTY;  Surgeon: Mauri Pole, MD;  Location: [facility];  Service: Orthopedics;  Laterality: Right;   WEIL OSTEOTOMY  05/28/2011    Family History  Problem Relation Age of Onset   Colon cancer Mother 101   Heart attack Father    Hypertension Other    Hyperlipidemia Other    Heart attack Brother    Hypertension Sister    Hypertension Brother    Stroke Neg Hx    Esophageal cancer Neg Hx    Rectal cancer Neg Hx    Stomach cancer Neg Hx     Social History   Socioeconomic History   Marital status: Married    Spouse name: Not on file   Number of children: Not on file   Years of education: Not on file   Highest education level: Not on file  Occupational History   Occupation: retired  Tobacco Use   Smoking status: Former    Types: Cigarettes    Quit date:  05/25/1973    Years since quitting: 48.3   Smokeless tobacco: Never  Vaping Use   Vaping Use: Never used  Substance and Sexual Activity   Alcohol use: Not Currently    Comment: rare   Drug use: No   Sexual activity: Not Currently  Other Topics Concern   Not on file  Social History Narrative   Regular exercise-yes. Pt lives in [place] with his wife.   Social Determinants of Health   Financial Resource Strain: Not on file  Food Insecurity: Not on file  Transportation Needs: Not on file  Physical Activity: Not on file  Stress: Not on file  Social Connections: Not on file  Intimate Partner Violence: Not on file    Prior to Admission medications   Medication Sig Start Date End Date Taking? Authorizing Provider  amiodarone (PACERONE) 200 MG tablet TAKE ONE-HALF TABLET BY MOUTH  DAILY Patient taking differently: Take 100 mg by mouth daily. 04/23/21  Yes Fay Records, MD  amLODipine (NORVASC) 5 MG tablet Take 1 tablet (5 mg total) by mouth daily. 06/23/21  Yes Fay Records, MD  aspirin EC 81 MG tablet Take 1 tablet (81 mg total) by mouth daily. Swallow whole. 11/02/19  Yes Fay Records, MD  atorvastatin (LIPITOR) 80 MG tablet TAKE 1 TABLET BY MOUTH ONCE  DAILY 04/23/21  Yes Fay Records, MD  carvedilol (COREG) 6.25 MG tablet TAKE 1 TABLET BY MOUTH  TWICE DAILY 12/30/20  Yes Fay Records, MD  ezetimibe (ZETIA) 10 MG tablet TAKE 1 TABLET BY MOUTH DAILY 04/23/21  Yes Fay Records, MD  furosemide (LASIX) 40 MG tablet Take 1.5 tablets (60 mg total) by mouth 2 (two) times daily. 09/22/21  Yes Kathyrn Drown D, NP  levothyroxine (SYNTHROID) 50 MCG tablet TAKE 1 TABLET BY MOUTH DAILY  BEFORE BREAKFAST 04/24/21  Yes Fay Records, MD  potassium chloride SA (KLOR-CON M) 20 MEQ tablet Take 1 tablet (20 mEq total) by mouth daily. 09/22/21  Yes Kathyrn Drown D, NP  sacubitril-valsartan (ENTRESTO) 49-51 MG Take 1 tablet by mouth 2 (two) times daily. 07/28/21  Yes Fay Records, MD  tamsulosin  (FLOMAX) 0.4 MG CAPS capsule Take 0.4 mg by mouth daily.   Yes [provider]  acetaminophen (TYLENOL) 500 MG tablet Take 1,000 mg by mouth daily as needed.    [provider]    Current Facility-Administered Medications  Medication Dose Route Frequency Provider Last Rate Last Admin   0.9 %  sodium chloride infusion  250 mL Intravenous PRN Early Osmond, MD       acetaminophen (TYLENOL) tablet 650 mg  650 mg Oral Q4H PRN Early Osmond, MD       [START ON 09/25/2021] amiodarone (PACERONE) tablet 100 mg  100 mg Oral Daily Tommie Raymond, NP       Derrill Memo ON 09/25/2021] amLODipine (NORVASC) tablet 5 mg  5 mg Oral Daily Tommie Raymond, NP       [START ON 09/25/2021] aspirin EC tablet 81 mg  81 mg Oral Daily Tommie Raymond, NP       Derrill Memo ON 09/25/2021] atorvastatin (LIPITOR) tablet 80 mg  80 mg Oral Daily Kathyrn Drown D, NP       carvedilol (COREG) tablet 6.25 mg  6.25 mg Oral BID WC Kathyrn Drown D, NP   6.25 mg at 09/24/21 1638   [START ON 09/25/2021] ezetimibe (ZETIA) tablet 10 mg  10 mg Oral Daily Kathyrn Drown D, NP       heparin injection 5,000 Units  5,000 Units Subcutaneous Q8H Kathyrn Drown D, NP       hydrALAZINE (APRESOLINE) injection 10 mg  10 mg Intravenous Q20 Min PRN Early Osmond, MD       labetalol (NORMODYNE) injection 10 mg  10 mg Intravenous Q10 min PRN Early Osmond, MD       [START ON 09/25/2021] levothyroxine (SYNTHROID) tablet 50 mcg  50 mcg Oral QAC breakfast Kathyrn Drown D, NP       ondansetron [facility]) injection 4 mg  4 mg Intravenous Q6H PRN Early Osmond, MD       [START ON 09/25/2021] potassium chloride SA (KLOR-CON M) CR tablet 20 mEq  20 mEq Oral Daily Kathyrn Drown D, NP       sacubitril-valsartan (ENTRESTO) 49-51 mg per tablet  1 tablet Oral BID Kathyrn Drown D, NP       sodium chloride flush (NS) 0.9 % injection 3 mL  3 mL Intravenous Q12H Early Osmond, MD       sodium chloride flush (NS) 0.9 % injection 3 mL  3 mL  Intravenous PRN Early Osmond, MD       [START ON 09/25/2021] tamsulosin (FLOMAX) capsule 0.4 mg  0.4 mg Oral Daily Kathyrn Drown D, NP        Allergies  Allergen Reactions   Sulfa Antibiotics Other (See Comments)    Carlyn Reichert Syndrome   Bactrim [Sulfamethoxazole-Trimethoprim] Other (See Comments)    Carlyn Reichert Syndrome      Review of Systems:   General:  normal appetite, + decreased energy, + weight gain, no weight loss, no fever  Cardiac:  no chest pain with exertion, no chest pain at rest, +SOB with mile exertion, no resting SOB, no PND, + orthopnea, no palpitations, + arrhythmia, no atrial fibrillation, + LE edema, no dizzy spells, no syncope  Respiratory:  + exertional shortness of breath, no home oxygen, + productive cough, + dry cough, no bronchitis, + wheezing, no hemoptysis, no asthma, no pain with inspiration or cough, no sleep apnea, no CPAP at night  GI:   no difficulty swallowing, no reflux, no frequent heartburn, no hiatal hernia, no abdominal pain, no constipation, no diarrhea, no hematochezia, no hematemesis, no melena  GU:   no dysuria,  no frequency, no urinary tract infection, no hematuria, no enlarged prostate, no  kidney stones, no kidney disease  Vascular:  no pain suggestive of claudication, no pain in feet, no leg cramps, no varicose veins, no DVT, no non-healing foot ulcer  Neuro:   no stroke, no TIA's, no seizures, no headaches, no temporary blindness one eye,  no slurred speech, no peripheral neuropathy, no chronic pain, no instability of gait, no memory/cognitive dysfunction  Musculoskeletal: no arthritis, no joint swelling, no myalgias, no difficulty walking, + normal mobility   Skin:   no rash, no itching, no skin infections, no pressure sores or ulcerations  Psych:   no anxiety, no depression, no nervousness, no unusual recent stress  Eyes:   no blurry vision, no floaters, no recent vision changes, no glasses or contacts  ENT:   no hearing loss,  no loose or painful teeth, partial upper bridge, last saw dentist few years ago.  Hematologic:  no easy bruising, no abnormal bleeding, no clotting disorder, no frequent epistaxis  Endocrine:  no diabetes, does not check CBG's at home     Physical Exam:   BP (!) 131/45 (BP Location: Right Arm)   Pulse 68   Temp 97.8 F (36.6 C) (Oral)   Resp 18   Ht '5\' 11"'$  (1.803 m)   Wt 97.5 kg   SpO2 96%   BMI 29.99 kg/m   General:  well-appearing  HEENT:  Unremarkable, NCAT, PERLA, EOMI  Neck:   no JVD, no bruits, no adenopathy   Chest:   clear to auscultation, symmetrical breath sounds, no wheezes, no rhonchi   CV:   RRR, 3/6 systolic murmur RSB, no diastolic murmur  Abdomen:  soft, non-tender, no masses   Extremities:  warm, well-perfused, pulses palpable at ankles, mild lower extremity edema  Rectal/GU  Deferred  Neuro:   Grossly non-focal and symmetrical throughout  Skin:   Clean and dry, no rashes, no breakdown  Diagnostic Tests:  Lab Results: Recent Labs    09/22/21 1256 09/24/21 1452 09/24/21 1456 09/24/21 1642  WBC 8.5  --   --  7.4  HGB 11.0*   < > 10.5* 10.9*  HCT 33.0*   < > 31.0* 33.3*  PLT 209  --   --  207   < > = values in this interval not displayed.   BMET:  Recent Labs    09/22/21 1255 09/24/21 1325 09/24/21 1452 09/24/21 1454 09/24/21 1456 09/24/21 1642  NA 145* 143   < > 146* 146*  --   K 3.3* 4.0   < > 3.5 3.3*  --   CL 109* 110  --   --   --   --   CO2 24 26  --   --   --   --   GLUCOSE 93 109*  --   --   --   --   BUN 30* 28*  --   --   --   --   CREATININE 1.15 1.29*  --   --   --  1.25*  CALCIUM 8.1* 8.2*  --   --   --   --    < > = values in this interval not displayed.      ECHOCARDIOGRAM REPORT         Patient Name:   Luis Herrera  Date of Exam: 09/18/2021  Medical Rec #:  [id number]     Height:       70.0 in  Accession #:    [id number]    Weight:  214.0 lb  Date of Birth:  August 05, 1940      BSA:          2.148 m  Patient Age:    59  years      BP:           140/62 mmHg  Patient Gender: M             HR:           85 bpm.  Exam Location:  [facility]   Procedure: 2D Echo, Cardiac Doppler and Color Doppler   Indications:    R06.00 Dyspnea     History:        Patient has prior history of Echocardiogram examinations,  most                  recent 01/09/2020. CAD, AVR-2009. and Prior CABG,                  Signs/Symptoms:Murmur; Risk Factors:Hypertension and                  Dyslipidemia.                  Aortic Valve: unknown bioprosthetic valve is present in  the                  aortic position. Procedure Date: 2009.     Sonographer:    Cresenciano Lick RDCS  Referring Phys: [id number] PAULA V ROSS   IMPRESSIONS     1. Normal LV function; s/p AVR with elevated mean gradient of 38 mmHg and  peak velocity of 4.3 m/s suggestive of severe AS; AI appears mild but  pressure half time of 156 suggests possibly more significant; gradient  worse compared to 01/09/20; suggest  TEE to better assess prosthetic aortic valve.   2. Left ventricular ejection fraction, by estimation, is 60 to 65%. The  left ventricle has normal function. The left ventricle has no regional  wall motion abnormalities. The left ventricular internal cavity size was  mildly dilated. Left ventricular  diastolic parameters are consistent with Grade II diastolic dysfunction  (pseudonormalization).   3. Right ventricular systolic function is normal. The right ventricular  size is mildly enlarged.   4. Left atrial size was severely dilated.   5. The mitral valve is normal in structure. Mild mitral valve  regurgitation. No evidence of mitral stenosis.   6. The aortic valve has been repaired/replaced. Aortic valve  regurgitation is mild. Severe aortic valve stenosis. There is a unknown  bioprosthetic valve present in the aortic position. Procedure Date: 2009.   7. The inferior vena cava is normal in size with greater than 50%  respiratory  variability, suggesting right atrial pressure of 3 mmHg.   FINDINGS   Left Ventricle: Left ventricular ejection fraction, by estimation, is 60  to 65%. The left ventricle has normal function. The left ventricle has no  regional wall motion abnormalities. The left ventricular internal cavity  size was mildly dilated. There is   no left ventricular hypertrophy. Left ventricular diastolic parameters  are consistent with Grade II diastolic dysfunction (pseudonormalization).   Right Ventricle: The right ventricular size is mildly enlarged. Right  ventricular systolic function is normal.   Left Atrium: Left atrial size was severely dilated.   Right Atrium: Right atrial size was normal in size.   Pericardium: There is no evidence of pericardial effusion.   Mitral Valve: The mitral valve is normal in  structure. Mild mitral annular  calcification. Mild mitral valve regurgitation. No evidence of mitral  valve stenosis.   Tricuspid Valve: The tricuspid valve is normal in structure. Tricuspid  valve regurgitation is mild . No evidence of tricuspid stenosis.   Aortic Valve: The aortic valve has been repaired/replaced. Aortic valve  regurgitation is mild. Aortic regurgitation PHT measures 156 msec. Severe  aortic stenosis is present. Aortic valve mean gradient measures 36.2 mmHg.  Aortic valve peak gradient  measures 69.4 mmHg. There is a unknown bioprosthetic valve present in the  aortic position. Procedure Date: 2009.   Pulmonic Valve: The pulmonic valve was normal in structure. Pulmonic valve  regurgitation is trivial. No evidence of pulmonic stenosis.   Aorta: The aortic root is normal in size and structure.   Venous: The inferior vena cava is normal in size with greater than 50%  respiratory variability, suggesting right atrial pressure of 3 mmHg.   IAS/Shunts: No atrial level shunt detected by color flow Doppler.   Additional Comments: Normal LV function; s/p AVR with elevated mean   gradient of 38 mmHg and peak velocity of 4.3 m/s suggestive of severe AS;  AI appears mild but pressure half time of 156 suggests possibly more  significant; gradient worse compared to  01/09/20; suggest TEE to better assess prosthetic aortic valve.      LEFT VENTRICLE  PLAX 2D  LVIDd:         5.90 cm Diastology  LVIDs:         4.10 cm LV e' medial:    8.33 cm/s  LV PW:         1.00 cm LV E/e' medial:  19.9  LV IVS:        1.00 cm LV e' lateral:   8.33 cm/s                         LV E/e' lateral: 19.9      RIGHT VENTRICLE             IVC  RV Basal diam:  5.10 cm     IVC diam: 1.65 cm  RV S prime:     12.73 cm/s  TAPSE (M-mode): 2.6 cm   LEFT ATRIUM              Index        RIGHT ATRIUM           Index  LA diam:        5.90 cm  2.75 cm/m   RA Area:     25.70 cm  LA Vol (A2C):   87.3 ml  40.64 ml/m  RA Volume:   92.20 ml  42.92 ml/m  LA Vol (A4C):   111.0 ml 51.67 ml/m  LA Biplane Vol: 102.0 ml 47.48 ml/m   AORTIC VALVE  AV Vmax:           416.50 cm/s  AV Vmean:          283.400 cm/s  AV VTI:            0.875 m  AV Peak Grad:      69.4 mmHg  AV Mean Grad:      36.2 mmHg  LVOT Vmax:         86.97 cm/s  LVOT Vmean:        61.167 cm/s  LVOT VTI:          0.200 m  LVOT/AV VTI  ratio: 0.23  AI PHT:            156 msec     AORTA  Ao Root diam: 3.50 cm   MITRAL VALVE                TRICUSPID VALVE  MV Area (PHT): 4.64 cm     TR Peak grad:   44.6 mmHg  MV Decel Time: 164 msec     TR Vmax:        334.00 cm/s  MV E velocity: 166.00 cm/s  MV A velocity: 136.50 cm/s  SHUNTS  MV E/A ratio:  1.22         Systemic VTI: 0.20 m   Kirk Ruths MD  Electronically signed by Kirk Ruths MD  Signature Date/Time: 09/18/2021/3:23:26 PM         Final     Physicians  Panel Physicians Referring Physician Case Authorizing Physician  Early Osmond, MD (Primary)     Procedures  RIGHT/LEFT HEART CATH AND CORONARY/GRAFT ANGIOGRAPHY   Conclusion  1.  Patent vein graft  to PDA and minimal obstructive disease of native left circulation.  The native right coronary artery was not imaged. 2.  Cardiac output of 8.8 L/min and cardiac index of 4.0 L/min/m with mean RA pressure of 5 mmHg and mean wedge pressure of 17 mmHg. 3.  Pulse pressure of near 100 mmHg with LVEDP of 24 to 28 mmHg across the respiratory cycle with significant bioprosthetic aortic valve regurgitation.   Recommendation: Will admit for medical stabilization.  Given elevation in creatinine and relatively normal wedge and RA pressures additional diuresis will be held for now and the patient will be monitored.   Indications  Acute on chronic heart failure with preserved ejection fraction (HCC) [I50.33 (ICD-10-CM)]   Clinical Presentation  CHF/Shock Congestive heart failure present. NYHA Class III. No shock present.   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  He was found to have bioprosthetic aortic valve degeneration with mixed stenosis and regurgitation.  He was discharged however he developed increasing peripheral edema.  His BNP was found to be significantly elevated.  He was referred for a right heart catheterization and coronary angiography to expedite his evaluation for aortic valve intervention and due to the indication of acute on chronic diastolic heart failure.  After obtaining consent the patient was brought to the cardiac catheterization laboratory and prepped and draped in a sterile fashion.  Xylocaine was used to anesthetize the right wrist and a 6 French Terumo glide sheath was placed there.  5000 units heparin and 5 mg of verapamil were administered through the sheath.  A previously placed antecubital IV was exchanged for a 5 [place] Terumo glide  sheath.  Standard catheters were used for bypass and coronary angiography however a 6 French 84 cm Destination sheath was required due to upper extremity tortuosity.  A TR band was placed and manual pressure applied to the antecubital site and hemostasis obtained. Estimated blood loss <50 mL.   During this procedure medications were administered to achieve and maintain moderate conscious sedation while the patient's heart rate, blood pressure, and oxygen saturation were continuously monitored and I was present face-to-face 100% of this time.   Medications (Filter: Administrations occurring from 1409 to 1557 on 09/24/21)  important  Continuous medications are totaled by the amount administered until 09/24/21 1557.   fentaNYL (SUBLIMAZE) injection (mcg) Total dose:  25 mcg  Date/Time Rate/Dose/Volume Action   09/24/21 1436 25 mcg Given    midazolam (VERSED) injection (mg) Total dose:  1 mg  Date/Time Rate/Dose/Volume Action   09/24/21 1436 1 mg Given    lidocaine (PF) (XYLOCAINE) 1 % injection (mL) Total volume:  4 mL  Date/Time Rate/Dose/Volume Action   09/24/21 1442 2 mL Given   1442 2 mL Given    Radial Cocktail/Verapamil only (mL) Total volume:  10 mL  Date/Time Rate/Dose/Volume Action   09/24/21 1446 10 mL Given    heparin sodium (porcine) injection (Units) Total dose:  5,000 Units  Date/Time Rate/Dose/Volume Action   09/24/21 1446 5,000 Units Given    Heparin (Porcine) in NaCl 1000-0.9 UT/500ML-% SOLN (mL) Total volume:  1,000 mL  Date/Time Rate/Dose/Volume Action   09/24/21 1538 500 mL Given   1538 500 mL Given    iohexol (OMNIPAQUE) 350 MG/ML injection (mL) Total volume:  45 mL  Date/Time Rate/Dose/Volume Action   09/24/21 1538 45 mL Given    Sedation Time  Sedation Time Physician-1: 1 hour 7 minutes 7 seconds Contrast  Medication Name Total Dose  iohexol (OMNIPAQUE) 350 MG/ML injection 45 mL   Radiation/Fluoro  Fluoro time: 19.7 (min) DAP:  50093 (mGycm2) Cumulative Air Kerma: 8182 (mGy) Complications  Complications documented before study signed (09/24/2021  9:93 PM)   No complications were associated with this study.  Documented by Chana Bode, RN - 09/24/2021  3:45 PM     Coronary Findings  Diagnostic Dominance: Co-dominant Left Anterior Descending  There is mild diffuse disease throughout the vessel.    Graft To RPDA    Intervention   No interventions have been documented.   Coronary Diagrams  Diagnostic Dominance: Co-dominant  Intervention  Implants     No implant documentation for this case.   Syngo Images   Show images for CARDIAC CATHETERIZATION Images on Long Term Storage   Show images for [place], [place] to Procedure Log  Procedure Log    Hemo Data  Flowsheet Row Most Recent Value  Fick Cardiac Output 8.81 L/min  Fick Cardiac Output Index 4.04 (L/min)/BSA  RA A Wave 7 mmHg  RA V Wave 5 mmHg  RA Mean 5 mmHg  RV Systolic Pressure 33 mmHg  RV Diastolic Pressure -1 mmHg  RV EDP 3 mmHg  PA Systolic Pressure 33 mmHg  PA Diastolic Pressure 14 mmHg  PA Mean 22 mmHg  PW A Wave 22 mmHg  PW V Wave 21 mmHg  PW Mean 17 mmHg  AO Systolic Pressure 716 mmHg  AO Diastolic Pressure 29 mmHg  AO Mean 65 mmHg  LV Systolic Pressure 967 mmHg  LV Diastolic Pressure 3 mmHg  LV EDP 28 mmHg  AOp Systolic Pressure 893 mmHg  AOp Diastolic Pressure 32 mmHg  AOp Mean Pressure 72 mmHg  LVp Systolic Pressure 810 mmHg  LVp Diastolic Pressure 3 mmHg  LVp EDP Pressure 24 mmHg  QP/QS 0.92  TPVR Index 5.94 HRUI  TSVR Index 13.86 HRUI  PVR SVR Ratio 0.11  TPVR/TSVR Ratio 0.43    ADDENDUM REPORT: 09/22/2021 19:06   CLINICAL DATA:  S/p aortic valve replacement and CABG.   EXAM: Cardiac TAVR CT   TECHNIQUE: A non-contrast, gated CT scan was obtained with axial slices of 3 mm through the heart for aortic valve calcium scoring. A 90 kV retrospective,  gated, contrast cardiac scan was obtained.  Gantry rotation speed was 250 msecs and collimation was 0.6 mm. Nitroglycerin was not given. The 3D data set was reconstructed in 5% intervals of the 0-95% of the R-R cycle. Systolic and diastolic phases were analyzed on a dedicated workstation using MPR, MIP, and VRT modes. The patient received 100 cc of contrast.   FINDINGS: Image quality: Excellent.   Noise artifact is: Limited.   Aortic valve prosthesis: Morphology consistent with Carpentier Edwards Perimount bioprosthesis. Internal diameter suggests 23 mm valve. The leaflets are freely mobile without calcification. There is severe prolapse of the leaflet in the [place]. There is no evidence of paravalvular leak.   Valve in valve analysis: A valve in valve analysis was performed using a 23 mm S3 based on recommendations from the valve in valve application. The virtual valve to coronary (VTC) distance for the left main is 7.8 mm. The ostium of the RCA takes off above the virtual valve. The distance between the tip of the virtual valve is still 7.5 mm from the RCA ostium. RCA appears to be non-dominant. Overall, low risk for coronary obstruction.   Optimal coplanar projection: LAO 4 CRA 16   Sinus of Valsalva Measurements:   Non-coronary: 37 mm   Right-coronary: 36 mm   Left-coronary: 37 mm   Sinotubular Junction: 28 mm   Ascending Thoracic Aorta: 34 mm   Coronary Arteries: Normal coronary origin. Left dominance. The study was performed without use of NTG and is insufficient for plaque evaluation. Please refer to recent cardiac catheterization for coronary assessment. There is severe 3-vessel coronary calcifications. 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   Cardiac Morphology:   Right Atrium: Right atrial size is within normal limits. Contrast reflux into the IVC consistent with elevated RA pressure.   Right Ventricle: The right ventricular cavity is within normal limits.   Left Atrium: Left atrial size is normal in  size with no left atrial appendage filling defect.   Left Ventricle: The ventricular cavity size is dilated.   Pulmonary arteries: Dilated pulmonary artery suggestive of pulmonary hypertension.   Pulmonary veins: Normal pulmonary venous drainage.   Pericardium: Normal thickness with no significant effusion or calcium present.   Mitral Valve: The mitral valve is normal structure with mild annular calcium.   Extra-cardiac findings: See attached radiology report for non-cardiac structures.   IMPRESSION: 1. 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   2. Valve in valve analysis supports a 23 mm S3. Low-risk for coronary obstruction.   3. Optimal Fluoroscopic Angle for Delivery: LAO 4 CRA 16   4. Dilated pulmonary artery suggestive of pulmonary hypertension. Contrast reflux into the IVC consistent with elevated RA pressure.   5. Normal coronary origin. Left dominance. 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   [place] Bells T. Audie Box, MD     Electronically Signed   By: Eleonore Chiquito M.D.   On: 09/22/2021 19:06    Addended by Geralynn Rile, MD on 09/22/2021  7:09 PM   Study Result  Narrative & Impression  EXAM: OVER-READ INTERPRETATION  CT CHEST   The following report is a limited chest CT over-read performed by radiologist Dr. Yetta Glassman of [facility], [place] on 09/19/2021. This over-read does not include interpretation of cardiac or coronary anatomy or pathology. The cardiac TAVR interpretation by the cardiologist is attached.   COMPARISON:  None Available.  FINDINGS: Extracardiac findings will be described separately under dictation for contemporaneously obtained CTA chest, abdomen and pelvis.   IMPRESSION: Please see separate dictation for contemporaneously obtained CTA chest, abdomen and pelvis dated 09/19/2021 for full description of relevant extracardiac  findings.   Electronically Signed: By: Yetta Glassman M.D. On: 09/19/2021 15:55      Narrative & Impression  CLINICAL DATA:  Preop evaluation for aortic valve replacement   EXAM: CT ANGIOGRAPHY CHEST, ABDOMEN AND PELVIS   TECHNIQUE: Multidetector CT imaging through the chest, abdomen and pelvis was performed using the standard protocol during bolus administration of intravenous contrast. Multiplanar reconstructed images and MIPs were obtained and reviewed to evaluate the vascular anatomy.   RADIATION DOSE REDUCTION: This exam was performed according to the departmental dose-optimization program which includes automated exposure control, adjustment of the mA and/or kV according to patient size and/or use of iterative reconstruction technique.   CONTRAST:  138m OMNIPAQUE IOHEXOL 350 MG/ML SOLN   COMPARISON:  None Available.   FINDINGS: CTA CHEST FINDINGS   Cardiovascular: Cardiomegaly. No pericardial effusion. Left main and three-vessel coronary artery calcifications. Status post aortic valve replacement. Normal caliber thoracic aorta with severe atherosclerotic disease. Standard three-vessel aortic arch with no significant stenosis.   Mediastinum/Nodes: Thyroid is unremarkable. Esophagus is unremarkable. No pathologically enlarged lymph nodes seen in the chest.   Lungs/Pleura: Central airways are patent. Bronchial wall thickening and bilateral intra and interlobular septal thickening, compatible with pulmonary edema. Small bilateral pleural effusions. Mild bibasilar atelectasis.   Musculoskeletal: Prior median sternotomy with intact sternal wires. No acute or significant osseous findings.   CTA ABDOMEN AND PELVIS FINDINGS   Hepatobiliary: No focal liver abnormality is seen. No gallstones, gallbladder wall thickening, or biliary dilatation.   Pancreas: Unremarkable. No pancreatic ductal dilatation or surrounding inflammatory changes.   Spleen: Normal in size  without focal abnormality.   Adrenals/Urinary Tract: Bilateral adrenal glands are unremarkable. No hydronephrosis or nephrolithiasis. Simple appearing exophytic cyst of the right kidney, no further follow-up imaging is recommended. Bladder is unremarkable.   Stomach/Bowel: Stomach is within normal limits. Diverticulosis. No evidence of bowel wall thickening, distention, or inflammatory changes.   Vascular/lymphatic: No pathologically enlarged lymph nodes seen in the abdomen or pelvis. Moderate narrowing at the origin of the celiac artery due to calcified plaque. Moderate narrowing at the origin of the SMA due to calcified and noncalcified plaque. Moderate narrowing at the origins of the bilateral renal arteries to calcified plaque. 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   Reproductive: Mild prostatomegaly with calcifications.   Other: Small fat containing umbilical hernia. Calcified soft tissue structure of the left lower quadrant, likely due to prior fat necrosis. No abdominopelvic ascites.   Musculoskeletal: Prior total left hip replacement. No aggressive appearing osseous lesions.   VASCULAR MEASUREMENTS PERTINENT TO TAVR:   AORTA:   Minimal Aortic Diameter-13.7 mm   Severity of Aortic Calcification-severe   RIGHT PELVIS:   Right Common Iliac Artery -   Minimal Diameter-6.9 mm   Tortuosity-mild   Calcification-severe   Right External Iliac Artery -   Minimal Diameter-9.2 mm   Tortuosity-moderate   Calcification-mild   Right Common Femoral Artery -   Minimal Diameter-6.6 mm   Tortuosity-none   Calcification-mild   LEFT PELVIS:   Left Common Iliac Artery -   Minimal Diameter-6.9 mm   Tortuosity-mild   Calcification-severe   Left External Iliac Artery -   Minimal Diameter-9.2  mm   Tortuosity-mild   Calcification-mild   Left Common Femoral  Artery -   Minimal Diameter-6.6 mm   Tortuosity-mild   Calcification-none   Review of the MIP images confirms the above findings.   IMPRESSION: 1. Vascular findings and measurements pertinent to potential TAVR procedure, as detailed above. 2. Prior aortic valve replacement. 3. Severe aortoiliac atherosclerosis. 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 4. Left main and 3 vessel coronary artery disease. 5. Pulmonary edema and small bilateral pleural effusions.     Electronically Signed   By: Yetta Glassman M.D.   On: 09/19/2021 19:09       Impression:  This 81 year old gentleman has stage D, severe, symptomatic, prosthetic aortic valve stenosis with New York Heart Association class III-IV symptoms of exertional fatigue and shortness of breath with wheezing and cough as well as lower extremity edema and an elevated BNP of 2254 consistent with acute on chronic diastolic congestive heart failure.  I have personally reviewed his 2D echocardiogram, cardiac catheterization, and CTA studies.  His echocardiogram shows a bioprosthetic aortic valve with a mean gradient of 36.2 mmHg and a peak gradient of 69 mmHg.  There is mild aortic insufficiency.  Left ventricular ejection fraction is 60 to 65%.  Cardiac catheterization showed a patent vein graft to the PDA with minimal obstructive disease in the left coronary circulation.  LVEDP was 24-28 with a mean wedge pressure of 17 mmHg.  I agree that aortic valve replacement is indicated in this patient with significant prosthetic aortic valve dysfunction.  Given his advanced age I think valve in valve transcatheter aortic valve replacement would be the best treatment for him.  His gated cardiac CTA shows anatomy suitable for this using a 23 mm SAPIEN 3 valve.  His abdominal and pelvic CTA shows adequate pelvic vascular anatomy to allow transfemoral  insertion.  The patient and his family were counseled at length regarding treatment alternatives for management of severe symptomatic aortic stenosis. The risks and benefits of surgical intervention has been discussed in detail. Long-term prognosis with medical therapy was discussed. Alternative approaches such as conventional surgical aortic valve replacement, transcatheter aortic valve replacement, and palliative medical therapy were compared and contrasted at length. This discussion was placed in the context of the patient's own specific clinical presentation and past medical history. All of their questions have been addressed.   Following the decision to proceed with transcatheter aortic valve replacement, a discussion was held regarding what types of management strategies would be attempted intraoperatively in the event of life-threatening complications, including whether or not the patient would be considered a candidate for the use of cardiopulmonary bypass and/or conversion to open sternotomy for attempted surgical intervention.  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  The patient is aware of the fact that transient use of cardiopulmonary bypass may be necessary. The patient has been advised of a variety of complications that might develop including but not limited to risks of death, stroke, paravalvular leak, aortic dissection or other major vascular complications, aortic annulus rupture, device embolization, cardiac rupture or perforation, mitral regurgitation, acute myocardial infarction, arrhythmia, heart block or bradycardia requiring permanent pacemaker placement, congestive heart failure, respiratory failure, renal failure, pneumonia, infection, other late complications related to structural valve  deterioration or migration, or other complications that might ultimately cause a temporary or permanent loss of functional independence or other long term morbidity. The patient provides full informed consent for the procedure as described and all questions were answered.      Plan:  He will be scheduled for valve in valve transcatheter aortic valve replacement using a SAPIEN 3 valve on Tuesday, 09/30/2021.   Gaye Pollack, MD 09/24/2021 7:27 PM

## 2021-09-24 NOTE — H&P (View-Only) (Signed)
[facility]   [facility]ID[facility] VALVE CLINIC         [address]       [place] [address]             [phone]          CARDIOTHORACIC SURGERY CONSULTATION REPORT  PCP is [name], MD Referring Provider is Lenna Sciara, MD Primary Cardiologist is [name], MD  Reason for consultation: Severe prosthetic aortic valve stenosis  HPI:  The patient is an 81 year old gentleman who underwent aortic valve replacement using a bioprosthetic valve and coronary bypass grafting with a saphenous vein graft to the PDA in 2009 in [place].  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  He has known prosthetic valve dysfunction by prior echocardiograms that has been followed by Dr. Harrington Challenger.  His gradients have been in the range of 18 to 22 mmHg.  He says that he was feeling fine until recently when he began having worsening shortness of breath and lower extremity edema.  He was seen by Dr. Harrington Challenger in March 2023 at which time a BNP was 5716.  His Lasix was increased and his BNP decreased some to 1852.  He [place] the mountains about 1 month ago and was walking to his car and felt suddenly very fatigued and short of breath.  Over the past week or so he began having exertional shortness of breath and fatigue with any activity.  He was seen by Dr. Harrington Challenger on 09/18/2021 and his BNP was again elevated.  A 2D echocardiogram showed an increase in the prosthetic aortic valve gradient with a mean of 38 and a peak of 69.  Aortic valve area by VTI was 0.87 cm.  Left ventricular ejection fraction was normal.  He was admitted for diuresis and TAVR workup.  He improved and was discharged home but then called the cardiology office with worsening shortness of breath and increased weight gain.  Cardiac catheterization was scheduled and he was admitted afterwards for a tune up prior to TAVR.  His wife and  daughter are with him at this time.  They report that he has not been able to do anything over the past week without getting very short of breath.  He has been sleeping in a recliner due to wheezing.  Past Medical History:  Diagnosis Date   Aortic stenosis 2008   Arthritis    Blood transfusion    Blood transfusion without reported diagnosis    CAD (coronary artery disease) 2008   Cataract    Complication of anesthesia    hallucinated once   H/O hiatal hernia    Heart murmur    AFTER REPLACED VALVE WENT AWAY   Hx of colonic polyps    Hyperlipidemia    Hypertension    Thyroid disease     Past Surgical History:  Procedure Laterality Date   AORTIC VALVE REPLACEMENT  2009   Bioprosthetic at [place] GRAFT  2009   SVG-PDA w/ AVR   CORRECTION HAMMER TOE  2011   BILATERAL   GASTROCNEMIUS RECESSION  05/28/2011   POLYPECTOMY     TONSILLECTOMY     TOTAL HIP ARTHROPLASTY Left 2000   TOTAL KNEE ARTHROPLASTY  2000   left  TOTAL KNEE ARTHROPLASTY  12/01/2011   Procedure: TOTAL KNEE ARTHROPLASTY;  Surgeon: Mauri Pole, MD;  Location: [facility];  Service: Orthopedics;  Laterality: Right;   WEIL OSTEOTOMY  05/28/2011    Family History  Problem Relation Age of Onset   Colon cancer Mother 27   Heart attack Father    Hypertension Other    Hyperlipidemia Other    Heart attack Brother    Hypertension Sister    Hypertension Brother    Stroke Neg Hx    Esophageal cancer Neg Hx    Rectal cancer Neg Hx    Stomach cancer Neg Hx     Social History   Socioeconomic History   Marital status: Married    Spouse name: Not on file   Number of children: Not on file   Years of education: Not on file   Highest education level: Not on file  Occupational History   Occupation: retired  Tobacco Use   Smoking status: Former    Types: Cigarettes    Quit date:  05/25/1973    Years since quitting: 48.3   Smokeless tobacco: Never  Vaping Use   Vaping Use: Never used  Substance and Sexual Activity   Alcohol use: Not Currently    Comment: rare   Drug use: No   Sexual activity: Not Currently  Other Topics Concern   Not on file  Social History Narrative   Regular exercise-yes. Pt lives in [place] with his wife.   Social Determinants of Health   Financial Resource Strain: Not on file  Food Insecurity: Not on file  Transportation Needs: Not on file  Physical Activity: Not on file  Stress: Not on file  Social Connections: Not on file  Intimate Partner Violence: Not on file    Prior to Admission medications   Medication Sig Start Date End Date Taking? Authorizing Provider  amiodarone (PACERONE) 200 MG tablet TAKE ONE-HALF TABLET BY MOUTH  DAILY Patient taking differently: Take 100 mg by mouth daily. 04/23/21  Yes Fay Records, MD  amLODipine (NORVASC) 5 MG tablet Take 1 tablet (5 mg total) by mouth daily. 06/23/21  Yes Fay Records, MD  aspirin EC 81 MG tablet Take 1 tablet (81 mg total) by mouth daily. Swallow whole. 11/02/19  Yes Fay Records, MD  atorvastatin (LIPITOR) 80 MG tablet TAKE 1 TABLET BY MOUTH ONCE  DAILY 04/23/21  Yes Fay Records, MD  carvedilol (COREG) 6.25 MG tablet TAKE 1 TABLET BY MOUTH  TWICE DAILY 12/30/20  Yes Fay Records, MD  ezetimibe (ZETIA) 10 MG tablet TAKE 1 TABLET BY MOUTH DAILY 04/23/21  Yes Fay Records, MD  furosemide (LASIX) 40 MG tablet Take 1.5 tablets (60 mg total) by mouth 2 (two) times daily. 09/22/21  Yes Kathyrn Drown D, NP  levothyroxine (SYNTHROID) 50 MCG tablet TAKE 1 TABLET BY MOUTH DAILY  BEFORE BREAKFAST 04/24/21  Yes Fay Records, MD  potassium chloride SA (KLOR-CON M) 20 MEQ tablet Take 1 tablet (20 mEq total) by mouth daily. 09/22/21  Yes Kathyrn Drown D, NP  sacubitril-valsartan (ENTRESTO) 49-51 MG Take 1 tablet by mouth 2 (two) times daily. 07/28/21  Yes Fay Records, MD  tamsulosin  (FLOMAX) 0.4 MG CAPS capsule Take 0.4 mg by mouth daily.   Yes [provider]  acetaminophen (TYLENOL) 500 MG tablet Take 1,000 mg by mouth daily as needed.    [provider]    Current Facility-Administered Medications  Medication Dose Route Frequency Provider Last Rate Last Admin   0.9 %  sodium chloride infusion  250 mL Intravenous PRN Early Osmond, MD       acetaminophen (TYLENOL) tablet 650 mg  650 mg Oral Q4H PRN Early Osmond, MD       [START ON 09/25/2021] amiodarone (PACERONE) tablet 100 mg  100 mg Oral Daily Tommie Raymond, NP       Derrill Memo ON 09/25/2021] amLODipine (NORVASC) tablet 5 mg  5 mg Oral Daily Tommie Raymond, NP       [START ON 09/25/2021] aspirin EC tablet 81 mg  81 mg Oral Daily Tommie Raymond, NP       Derrill Memo ON 09/25/2021] atorvastatin (LIPITOR) tablet 80 mg  80 mg Oral Daily Kathyrn Drown D, NP       carvedilol (COREG) tablet 6.25 mg  6.25 mg Oral BID WC Kathyrn Drown D, NP   6.25 mg at 09/24/21 1638   [START ON 09/25/2021] ezetimibe (ZETIA) tablet 10 mg  10 mg Oral Daily Kathyrn Drown D, NP       heparin injection 5,000 Units  5,000 Units Subcutaneous Q8H Kathyrn Drown D, NP       hydrALAZINE (APRESOLINE) injection 10 mg  10 mg Intravenous Q20 Min PRN Early Osmond, MD       labetalol (NORMODYNE) injection 10 mg  10 mg Intravenous Q10 min PRN Early Osmond, MD       [START ON 09/25/2021] levothyroxine (SYNTHROID) tablet 50 mcg  50 mcg Oral QAC breakfast Kathyrn Drown D, NP       ondansetron [facility]) injection 4 mg  4 mg Intravenous Q6H PRN Early Osmond, MD       [START ON 09/25/2021] potassium chloride SA (KLOR-CON M) CR tablet 20 mEq  20 mEq Oral Daily Kathyrn Drown D, NP       sacubitril-valsartan (ENTRESTO) 49-51 mg per tablet  1 tablet Oral BID Kathyrn Drown D, NP       sodium chloride flush (NS) 0.9 % injection 3 mL  3 mL Intravenous Q12H Early Osmond, MD       sodium chloride flush (NS) 0.9 % injection 3 mL  3 mL  Intravenous PRN Early Osmond, MD       [START ON 09/25/2021] tamsulosin (FLOMAX) capsule 0.4 mg  0.4 mg Oral Daily Kathyrn Drown D, NP        Allergies  Allergen Reactions   Sulfa Antibiotics Other (See Comments)    Carlyn Reichert Syndrome   Bactrim [Sulfamethoxazole-Trimethoprim] Other (See Comments)    Carlyn Reichert Syndrome      Review of Systems:   General:  normal appetite, + decreased energy, + weight gain, no weight loss, no fever  Cardiac:  no chest pain with exertion, no chest pain at rest, +SOB with mile exertion, no resting SOB, no PND, + orthopnea, no palpitations, + arrhythmia, no atrial fibrillation, + LE edema, no dizzy spells, no syncope  Respiratory:  + exertional shortness of breath, no home oxygen, + productive cough, + dry cough, no bronchitis, + wheezing, no hemoptysis, no asthma, no pain with inspiration or cough, no sleep apnea, no CPAP at night  GI:   no difficulty swallowing, no reflux, no frequent heartburn, no hiatal hernia, no abdominal pain, no constipation, no diarrhea, no hematochezia, no hematemesis, no melena  GU:   no dysuria,  no frequency, no urinary tract infection, no hematuria, no enlarged prostate, no  kidney stones, no kidney disease  Vascular:  no pain suggestive of claudication, no pain in feet, no leg cramps, no varicose veins, no DVT, no non-healing foot ulcer  Neuro:   no stroke, no TIA's, no seizures, no headaches, no temporary blindness one eye,  no slurred speech, no peripheral neuropathy, no chronic pain, no instability of gait, no memory/cognitive dysfunction  Musculoskeletal: no arthritis, no joint swelling, no myalgias, no difficulty walking, + normal mobility   Skin:   no rash, no itching, no skin infections, no pressure sores or ulcerations  Psych:   no anxiety, no depression, no nervousness, no unusual recent stress  Eyes:   no blurry vision, no floaters, no recent vision changes, no glasses or contacts  ENT:   no hearing loss,  no loose or painful teeth, partial upper bridge, last saw dentist few years ago.  Hematologic:  no easy bruising, no abnormal bleeding, no clotting disorder, no frequent epistaxis  Endocrine:  no diabetes, does not check CBG's at home     Physical Exam:   BP (!) 131/45 (BP Location: Right Arm)   Pulse 68   Temp 97.8 F (36.6 C) (Oral)   Resp 18   Ht '5\' 11"'$  (1.803 m)   Wt 97.5 kg   SpO2 96%   BMI 29.99 kg/m   General:  well-appearing  HEENT:  Unremarkable, NCAT, PERLA, EOMI  Neck:   no JVD, no bruits, no adenopathy   Chest:   clear to auscultation, symmetrical breath sounds, no wheezes, no rhonchi   CV:   RRR, 3/6 systolic murmur RSB, no diastolic murmur  Abdomen:  soft, non-tender, no masses   Extremities:  warm, well-perfused, pulses palpable at ankles, mild lower extremity edema  Rectal/GU  Deferred  Neuro:   Grossly non-focal and symmetrical throughout  Skin:   Clean and dry, no rashes, no breakdown  Diagnostic Tests:  Lab Results: Recent Labs    09/22/21 1256 09/24/21 1452 09/24/21 1456 09/24/21 1642  WBC 8.5  --   --  7.4  HGB 11.0*   < > 10.5* 10.9*  HCT 33.0*   < > 31.0* 33.3*  PLT 209  --   --  207   < > = values in this interval not displayed.   BMET:  Recent Labs    09/22/21 1255 09/24/21 1325 09/24/21 1452 09/24/21 1454 09/24/21 1456 09/24/21 1642  NA 145* 143   < > 146* 146*  --   K 3.3* 4.0   < > 3.5 3.3*  --   CL 109* 110  --   --   --   --   CO2 24 26  --   --   --   --   GLUCOSE 93 109*  --   --   --   --   BUN 30* 28*  --   --   --   --   CREATININE 1.15 1.29*  --   --   --  1.25*  CALCIUM 8.1* 8.2*  --   --   --   --    < > = values in this interval not displayed.      ECHOCARDIOGRAM REPORT         Patient Name:   Luis Herrera  Date of Exam: 09/18/2021  Medical Rec #:  [id number]     Height:       70.0 in  Accession #:    [id number]    Weight:  214.0 lb  Date of Birth:  10-19-1940      BSA:          2.148 m  Patient Age:    67  years      BP:           140/62 mmHg  Patient Gender: M             HR:           85 bpm.  Exam Location:  [place]   Procedure: 2D Echo, Cardiac Doppler and Color Doppler   Indications:    R06.00 Dyspnea     History:        Patient has prior history of Echocardiogram examinations,  most                  recent 01/09/2020. CAD, AVR-2009. and Prior CABG,                  Signs/Symptoms:Murmur; Risk Factors:Hypertension and                  Dyslipidemia.                  Aortic Valve: unknown bioprosthetic valve is present in  the                  aortic position. Procedure Date: 2009.     Sonographer:    Cresenciano Lick RDCS  Referring Phys: [id number] PAULA V ROSS   IMPRESSIONS     1. Normal LV function; s/p AVR with elevated mean gradient of 38 mmHg and  peak velocity of 4.3 m/s suggestive of severe AS; AI appears mild but  pressure half time of 156 suggests possibly more significant; gradient  worse compared to 01/09/20; suggest  TEE to better assess prosthetic aortic valve.   2. Left ventricular ejection fraction, by estimation, is 60 to 65%. The  left ventricle has normal function. The left ventricle has no regional  wall motion abnormalities. The left ventricular internal cavity size was  mildly dilated. Left ventricular  diastolic parameters are consistent with Grade II diastolic dysfunction  (pseudonormalization).   3. Right ventricular systolic function is normal. The right ventricular  size is mildly enlarged.   4. Left atrial size was severely dilated.   5. The mitral valve is normal in structure. Mild mitral valve  regurgitation. No evidence of mitral stenosis.   6. The aortic valve has been repaired/replaced. Aortic valve  regurgitation is mild. Severe aortic valve stenosis. There is a unknown  bioprosthetic valve present in the aortic position. Procedure Date: 2009.   7. The inferior vena cava is normal in size with greater than 50%  respiratory  variability, suggesting right atrial pressure of 3 mmHg.   FINDINGS   Left Ventricle: Left ventricular ejection fraction, by estimation, is 60  to 65%. The left ventricle has normal function. The left ventricle has no  regional wall motion abnormalities. The left ventricular internal cavity  size was mildly dilated. There is   no left ventricular hypertrophy. Left ventricular diastolic parameters  are consistent with Grade II diastolic dysfunction (pseudonormalization).   Right Ventricle: The right ventricular size is mildly enlarged. Right  ventricular systolic function is normal.   Left Atrium: Left atrial size was severely dilated.   Right Atrium: Right atrial size was normal in size.   Pericardium: There is no evidence of pericardial effusion.   Mitral Valve: The mitral valve is normal in  structure. Mild mitral annular  calcification. Mild mitral valve regurgitation. No evidence of mitral  valve stenosis.   Tricuspid Valve: The tricuspid valve is normal in structure. Tricuspid  valve regurgitation is mild . No evidence of tricuspid stenosis.   Aortic Valve: The aortic valve has been repaired/replaced. Aortic valve  regurgitation is mild. Aortic regurgitation PHT measures 156 msec. Severe  aortic stenosis is present. Aortic valve mean gradient measures 36.2 mmHg.  Aortic valve peak gradient  measures 69.4 mmHg. There is a unknown bioprosthetic valve present in the  aortic position. Procedure Date: 2009.   Pulmonic Valve: The pulmonic valve was normal in structure. Pulmonic valve  regurgitation is trivial. No evidence of pulmonic stenosis.   Aorta: The aortic root is normal in size and structure.   Venous: The inferior vena cava is normal in size with greater than 50%  respiratory variability, suggesting right atrial pressure of 3 mmHg.   IAS/Shunts: No atrial level shunt detected by color flow Doppler.   Additional Comments: Normal LV function; s/p AVR with elevated mean   gradient of 38 mmHg and peak velocity of 4.3 m/s suggestive of severe AS;  AI appears mild but pressure half time of 156 suggests possibly more  significant; gradient worse compared to  01/09/20; suggest TEE to better assess prosthetic aortic valve.      LEFT VENTRICLE  PLAX 2D  LVIDd:         5.90 cm Diastology  LVIDs:         4.10 cm LV e' medial:    8.33 cm/s  LV PW:         1.00 cm LV E/e' medial:  19.9  LV IVS:        1.00 cm LV e' lateral:   8.33 cm/s                         LV E/e' lateral: 19.9      RIGHT VENTRICLE             IVC  RV Basal diam:  5.10 cm     IVC diam: 1.65 cm  RV S prime:     12.73 cm/s  TAPSE (M-mode): 2.6 cm   LEFT ATRIUM              Index        RIGHT ATRIUM           Index  LA diam:        5.90 cm  2.75 cm/m   RA Area:     25.70 cm  LA Vol (A2C):   87.3 ml  40.64 ml/m  RA Volume:   92.20 ml  42.92 ml/m  LA Vol (A4C):   111.0 ml 51.67 ml/m  LA Biplane Vol: 102.0 ml 47.48 ml/m   AORTIC VALVE  AV Vmax:           416.50 cm/s  AV Vmean:          283.400 cm/s  AV VTI:            0.875 m  AV Peak Grad:      69.4 mmHg  AV Mean Grad:      36.2 mmHg  LVOT Vmax:         86.97 cm/s  LVOT Vmean:        61.167 cm/s  LVOT VTI:          0.200 m  LVOT/AV VTI  ratio: 0.23  AI PHT:            156 msec     AORTA  Ao Root diam: 3.50 cm   MITRAL VALVE                TRICUSPID VALVE  MV Area (PHT): 4.64 cm     TR Peak grad:   44.6 mmHg  MV Decel Time: 164 msec     TR Vmax:        334.00 cm/s  MV E velocity: 166.00 cm/s  MV A velocity: 136.50 cm/s  SHUNTS  MV E/A ratio:  1.22         Systemic VTI: 0.20 m   Kirk Ruths MD  Electronically signed by Kirk Ruths MD  Signature Date/Time: 09/18/2021/3:23:26 PM         Final     Physicians  Panel Physicians Referring Physician Case Authorizing Physician  Early Osmond, MD (Primary)     Procedures  RIGHT/LEFT HEART CATH AND CORONARY/GRAFT ANGIOGRAPHY   Conclusion  1.  Patent vein graft  to PDA and minimal obstructive disease of native left circulation.  The native right coronary artery was not imaged. 2.  Cardiac output of 8.8 L/min and cardiac index of 4.0 L/min/m with mean RA pressure of 5 mmHg and mean wedge pressure of 17 mmHg. 3.  Pulse pressure of near 100 mmHg with LVEDP of 24 to 28 mmHg across the respiratory cycle with significant bioprosthetic aortic valve regurgitation.   Recommendation: Will admit for medical stabilization.  Given elevation in creatinine and relatively normal wedge and RA pressures additional diuresis will be held for now and the patient will be monitored.   Indications  Acute on chronic heart failure with preserved ejection fraction (HCC) [I50.33 (ICD-10-CM)]   Clinical Presentation  CHF/Shock Congestive heart failure present. NYHA Class III. No shock present.   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  He was found to have bioprosthetic aortic valve degeneration with mixed stenosis and regurgitation.  He was discharged however he developed increasing peripheral edema.  His BNP was found to be significantly elevated.  He was referred for a right heart catheterization and coronary angiography to expedite his evaluation for aortic valve intervention and due to the indication of acute on chronic diastolic heart failure.  After obtaining consent the patient was brought to the cardiac catheterization laboratory and prepped and draped in a sterile fashion.  Xylocaine was used to anesthetize the right wrist and a 6 French Terumo glide sheath was placed there.  5000 units heparin and 5 mg of verapamil were administered through the sheath.  A previously placed antecubital IV was exchanged for a 5 [place] Terumo glide  sheath.  Standard catheters were used for bypass and coronary angiography however a 6 French 84 cm Destination sheath was required due to upper extremity tortuosity.  A TR band was placed and manual pressure applied to the antecubital site and hemostasis obtained. Estimated blood loss <50 mL.   During this procedure medications were administered to achieve and maintain moderate conscious sedation while the patient's heart rate, blood pressure, and oxygen saturation were continuously monitored and I was present face-to-face 100% of this time.   Medications (Filter: Administrations occurring from 1409 to 1557 on 09/24/21)  important  Continuous medications are totaled by the amount administered until 09/24/21 1557.   fentaNYL (SUBLIMAZE) injection (mcg) Total dose:  25 mcg  Date/Time Rate/Dose/Volume Action   09/24/21 1436 25 mcg Given    midazolam (VERSED) injection (mg) Total dose:  1 mg  Date/Time Rate/Dose/Volume Action   09/24/21 1436 1 mg Given    lidocaine (PF) (XYLOCAINE) 1 % injection (mL) Total volume:  4 mL  Date/Time Rate/Dose/Volume Action   09/24/21 1442 2 mL Given   1442 2 mL Given    Radial Cocktail/Verapamil only (mL) Total volume:  10 mL  Date/Time Rate/Dose/Volume Action   09/24/21 1446 10 mL Given    heparin sodium (porcine) injection (Units) Total dose:  5,000 Units  Date/Time Rate/Dose/Volume Action   09/24/21 1446 5,000 Units Given    Heparin (Porcine) in NaCl 1000-0.9 UT/500ML-% SOLN (mL) Total volume:  1,000 mL  Date/Time Rate/Dose/Volume Action   09/24/21 1538 500 mL Given   1538 500 mL Given    iohexol (OMNIPAQUE) 350 MG/ML injection (mL) Total volume:  45 mL  Date/Time Rate/Dose/Volume Action   09/24/21 1538 45 mL Given    Sedation Time  Sedation Time Physician-1: 1 hour 7 minutes 7 seconds Contrast  Medication Name Total Dose  iohexol (OMNIPAQUE) 350 MG/ML injection 45 mL   Radiation/Fluoro  Fluoro time: 19.7 (min) DAP:  09326 (mGycm2) Cumulative Air Kerma: 7124 (mGy) Complications  Complications documented before study signed (09/24/2021  5:80 PM)   No complications were associated with this study.  Documented by Chana Bode, RN - 09/24/2021  3:45 PM     Coronary Findings  Diagnostic Dominance: Co-dominant Left Anterior Descending  There is mild diffuse disease throughout the vessel.    Graft To RPDA    Intervention   No interventions have been documented.   Coronary Diagrams  Diagnostic Dominance: Co-dominant  Intervention  Implants     No implant documentation for this case.   Syngo Images   Show images for CARDIAC CATHETERIZATION Images on Long Term Storage   Show images for [place], [place] to Procedure Log  Procedure Log    Hemo Data  Flowsheet Row Most Recent Value  Fick Cardiac Output 8.81 L/min  Fick Cardiac Output Index 4.04 (L/min)/BSA  RA A Wave 7 mmHg  RA V Wave 5 mmHg  RA Mean 5 mmHg  RV Systolic Pressure 33 mmHg  RV Diastolic Pressure -1 mmHg  RV EDP 3 mmHg  PA Systolic Pressure 33 mmHg  PA Diastolic Pressure 14 mmHg  PA Mean 22 mmHg  PW A Wave 22 mmHg  PW V Wave 21 mmHg  PW Mean 17 mmHg  AO Systolic Pressure 998 mmHg  AO Diastolic Pressure 29 mmHg  AO Mean 65 mmHg  LV Systolic Pressure 338 mmHg  LV Diastolic Pressure 3 mmHg  LV EDP 28 mmHg  AOp Systolic Pressure 250 mmHg  AOp Diastolic Pressure 32 mmHg  AOp Mean Pressure 72 mmHg  LVp Systolic Pressure 539 mmHg  LVp Diastolic Pressure 3 mmHg  LVp EDP Pressure 24 mmHg  QP/QS 0.92  TPVR Index 5.94 HRUI  TSVR Index 13.86 HRUI  PVR SVR Ratio 0.11  TPVR/TSVR Ratio 0.43    ADDENDUM REPORT: 09/22/2021 19:06   CLINICAL DATA:  S/p aortic valve replacement and CABG.   EXAM: Cardiac TAVR CT   TECHNIQUE: A non-contrast, gated CT scan was obtained with axial slices of 3 mm through the heart for aortic valve calcium scoring. A 90 kV retrospective,  gated, contrast cardiac scan was obtained.  Gantry rotation speed was 250 msecs and collimation was 0.6 mm. Nitroglycerin was not given. The 3D data set was reconstructed in 5% intervals of the 0-95% of the R-R cycle. Systolic and diastolic phases were analyzed on a dedicated workstation using MPR, MIP, and VRT modes. The patient received 100 cc of contrast.   FINDINGS: Image quality: Excellent.   Noise artifact is: Limited.   Aortic valve prosthesis: Morphology consistent with Carpentier Edwards Perimount bioprosthesis. Internal diameter suggests 23 mm valve. The leaflets are freely mobile without calcification. There is severe prolapse of the leaflet in the [place]. There is no evidence of paravalvular leak.   Valve in valve analysis: A valve in valve analysis was performed using a 23 mm S3 based on recommendations from the valve in valve application. The virtual valve to coronary (VTC) distance for the left main is 7.8 mm. The ostium of the RCA takes off above the virtual valve. The distance between the tip of the virtual valve is still 7.5 mm from the RCA ostium. RCA appears to be non-dominant. Overall, low risk for coronary obstruction.   Optimal coplanar projection: LAO 4 CRA 16   Sinus of Valsalva Measurements:   Non-coronary: 37 mm   Right-coronary: 36 mm   Left-coronary: 37 mm   Sinotubular Junction: 28 mm   Ascending Thoracic Aorta: 34 mm   Coronary Arteries: Normal coronary origin. Left dominance. The study was performed without use of NTG and is insufficient for plaque evaluation. Please refer to recent cardiac catheterization for coronary assessment. There is severe 3-vessel coronary calcifications. 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   Cardiac Morphology:   Right Atrium: Right atrial size is within normal limits. Contrast reflux into the IVC consistent with elevated RA pressure.   Right Ventricle: The right ventricular cavity is within normal limits.   Left Atrium: Left atrial size is normal in  size with no left atrial appendage filling defect.   Left Ventricle: The ventricular cavity size is dilated.   Pulmonary arteries: Dilated pulmonary artery suggestive of pulmonary hypertension.   Pulmonary veins: Normal pulmonary venous drainage.   Pericardium: Normal thickness with no significant effusion or calcium present.   Mitral Valve: The mitral valve is normal structure with mild annular calcium.   Extra-cardiac findings: See attached radiology report for non-cardiac structures.   IMPRESSION: 1. 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   2. Valve in valve analysis supports a 23 mm S3. Low-risk for coronary obstruction.   3. Optimal Fluoroscopic Angle for Delivery: LAO 4 CRA 16   4. Dilated pulmonary artery suggestive of pulmonary hypertension. Contrast reflux into the IVC consistent with elevated RA pressure.   5. Normal coronary origin. Left dominance. 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   Lake Bells T. Audie Box, MD     Electronically Signed   By: Eleonore Chiquito M.D.   On: 09/22/2021 19:06    Addended by Geralynn Rile, MD on 09/22/2021  7:09 PM   Study Result  Narrative & Impression  EXAM: OVER-READ INTERPRETATION  CT CHEST   The following report is a limited chest CT over-read performed by radiologist Dr. Yetta Glassman of [facility], [place] on 09/19/2021. This over-read does not include interpretation of cardiac or coronary anatomy or pathology. The cardiac TAVR interpretation by the cardiologist is attached.   COMPARISON:  None Available.  FINDINGS: Extracardiac findings will be described separately under dictation for contemporaneously obtained CTA chest, abdomen and pelvis.   IMPRESSION: Please see separate dictation for contemporaneously obtained CTA chest, abdomen and pelvis dated 09/19/2021 for full description of relevant extracardiac  findings.   Electronically Signed: By: Yetta Glassman M.D. On: 09/19/2021 15:55      Narrative & Impression  CLINICAL DATA:  Preop evaluation for aortic valve replacement   EXAM: CT ANGIOGRAPHY CHEST, ABDOMEN AND PELVIS   TECHNIQUE: Multidetector CT imaging through the chest, abdomen and pelvis was performed using the standard protocol during bolus administration of intravenous contrast. Multiplanar reconstructed images and MIPs were obtained and reviewed to evaluate the vascular anatomy.   RADIATION DOSE REDUCTION: This exam was performed according to the departmental dose-optimization program which includes automated exposure control, adjustment of the mA and/or kV according to patient size and/or use of iterative reconstruction technique.   CONTRAST:  176m OMNIPAQUE IOHEXOL 350 MG/ML SOLN   COMPARISON:  None Available.   FINDINGS: CTA CHEST FINDINGS   Cardiovascular: Cardiomegaly. No pericardial effusion. Left main and three-vessel coronary artery calcifications. Status post aortic valve replacement. Normal caliber thoracic aorta with severe atherosclerotic disease. Standard three-vessel aortic arch with no significant stenosis.   Mediastinum/Nodes: Thyroid is unremarkable. Esophagus is unremarkable. No pathologically enlarged lymph nodes seen in the chest.   Lungs/Pleura: Central airways are patent. Bronchial wall thickening and bilateral intra and interlobular septal thickening, compatible with pulmonary edema. Small bilateral pleural effusions. Mild bibasilar atelectasis.   Musculoskeletal: Prior median sternotomy with intact sternal wires. No acute or significant osseous findings.   CTA ABDOMEN AND PELVIS FINDINGS   Hepatobiliary: No focal liver abnormality is seen. No gallstones, gallbladder wall thickening, or biliary dilatation.   Pancreas: Unremarkable. No pancreatic ductal dilatation or surrounding inflammatory changes.   Spleen: Normal in size  without focal abnormality.   Adrenals/Urinary Tract: Bilateral adrenal glands are unremarkable. No hydronephrosis or nephrolithiasis. Simple appearing exophytic cyst of the right kidney, no further follow-up imaging is recommended. Bladder is unremarkable.   Stomach/Bowel: Stomach is within normal limits. Diverticulosis. No evidence of bowel wall thickening, distention, or inflammatory changes.   Vascular/lymphatic: No pathologically enlarged lymph nodes seen in the abdomen or pelvis. Moderate narrowing at the origin of the celiac artery due to calcified plaque. Moderate narrowing at the origin of the SMA due to calcified and noncalcified plaque. Moderate narrowing at the origins of the bilateral renal arteries to calcified plaque. 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   Reproductive: Mild prostatomegaly with calcifications.   Other: Small fat containing umbilical hernia. Calcified soft tissue structure of the left lower quadrant, likely due to prior fat necrosis. No abdominopelvic ascites.   Musculoskeletal: Prior total left hip replacement. No aggressive appearing osseous lesions.   VASCULAR MEASUREMENTS PERTINENT TO TAVR:   AORTA:   Minimal Aortic Diameter-13.7 mm   Severity of Aortic Calcification-severe   RIGHT PELVIS:   Right Common Iliac Artery -   Minimal Diameter-6.9 mm   Tortuosity-mild   Calcification-severe   Right External Iliac Artery -   Minimal Diameter-9.2 mm   Tortuosity-moderate   Calcification-mild   Right Common Femoral Artery -   Minimal Diameter-6.6 mm   Tortuosity-none   Calcification-mild   LEFT PELVIS:   Left Common Iliac Artery -   Minimal Diameter-6.9 mm   Tortuosity-mild   Calcification-severe   Left External Iliac Artery -   Minimal Diameter-9.2  mm   Tortuosity-mild   Calcification-mild   Left Common Femoral  Artery -   Minimal Diameter-6.6 mm   Tortuosity-mild   Calcification-none   Review of the MIP images confirms the above findings.   IMPRESSION: 1. Vascular findings and measurements pertinent to potential TAVR procedure, as detailed above. 2. Prior aortic valve replacement. 3. Severe aortoiliac atherosclerosis. 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 4. Left main and 3 vessel coronary artery disease. 5. Pulmonary edema and small bilateral pleural effusions.     Electronically Signed   By: Yetta Glassman M.D.   On: 09/19/2021 19:09       Impression:  This 81 year old gentleman has stage D, severe, symptomatic, prosthetic aortic valve stenosis with New York Heart Association class III-IV symptoms of exertional fatigue and shortness of breath with wheezing and cough as well as lower extremity edema and an elevated BNP of 2254 consistent with acute on chronic diastolic congestive heart failure.  I have personally reviewed his 2D echocardiogram, cardiac catheterization, and CTA studies.  His echocardiogram shows a bioprosthetic aortic valve with a mean gradient of 36.2 mmHg and a peak gradient of 69 mmHg.  There is mild aortic insufficiency.  Left ventricular ejection fraction is 60 to 65%.  Cardiac catheterization showed a patent vein graft to the PDA with minimal obstructive disease in the left coronary circulation.  LVEDP was 24-28 with a mean wedge pressure of 17 mmHg.  I agree that aortic valve replacement is indicated in this patient with significant prosthetic aortic valve dysfunction.  Given his advanced age I think valve in valve transcatheter aortic valve replacement would be the best treatment for him.  His gated cardiac CTA shows anatomy suitable for this using a 23 mm SAPIEN 3 valve.  His abdominal and pelvic CTA shows adequate pelvic vascular anatomy to allow transfemoral  insertion.  The patient and his family were counseled at length regarding treatment alternatives for management of severe symptomatic aortic stenosis. The risks and benefits of surgical intervention has been discussed in detail. Long-term prognosis with medical therapy was discussed. Alternative approaches such as conventional surgical aortic valve replacement, transcatheter aortic valve replacement, and palliative medical therapy were compared and contrasted at length. This discussion was placed in the context of the patient's own specific clinical presentation and past medical history. All of their questions have been addressed.   Following the decision to proceed with transcatheter aortic valve replacement, a discussion was held regarding what types of management strategies would be attempted intraoperatively in the event of life-threatening complications, including whether or not the patient would be considered a candidate for the use of cardiopulmonary bypass and/or conversion to open sternotomy for attempted surgical intervention.  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  The patient is aware of the fact that transient use of cardiopulmonary bypass may be necessary. The patient has been advised of a variety of complications that might develop including but not limited to risks of death, stroke, paravalvular leak, aortic dissection or other major vascular complications, aortic annulus rupture, device embolization, cardiac rupture or perforation, mitral regurgitation, acute myocardial infarction, arrhythmia, heart block or bradycardia requiring permanent pacemaker placement, congestive heart failure, respiratory failure, renal failure, pneumonia, infection, other late complications related to structural valve  deterioration or migration, or other complications that might ultimately cause a temporary or permanent loss of functional independence or other long term morbidity. The patient provides full informed consent for the procedure as described and all questions were answered.      Plan:  He will be scheduled for valve in valve transcatheter aortic valve replacement using a SAPIEN 3 valve on Tuesday, 09/30/2021.   Gaye Pollack, MD 09/24/2021 7:27 PM

## 2021-09-24 NOTE — Interval H&P Note (Signed)
History and Physical Interval Note:  09/24/2021 1:55 PM  Luis Herrera  has presented today for surgery, with the diagnosis of Aortic stenosis/CHF/TAVR.  The various methods of treatment have been discussed with the patient and family. After consideration of risks, benefits and other options for treatment, the patient has consented to  Procedure(s): RIGHT/LEFT HEART CATH AND CORONARY ANGIOGRAPHY (N/A) as a surgical intervention.  The patient's history has been reviewed, patient examined, no change in status, stable for surgery.  I have reviewed the patient's chart and labs.  Questions were answered to the patient's satisfaction.     Early Osmond

## 2021-09-24 NOTE — H&P (Addendum)
[facility]   MULTIDISCIPLINARY HEART VALVE TEAM   History & Physical    Patient ID: Luis Herrera MRN: [id number], DOB/AGE: 04-10-1940   Admit date: 09/24/2021  Primary Physician: [name], MD Primary Cardiologist: [name], MD   Patient Profile    Luis Herrera is a 81 y.o. 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       Past Medical History   Past Medical History:  Diagnosis Date   Aortic stenosis 2008   Arthritis    Blood transfusion    Blood transfusion without reported diagnosis    CAD (coronary artery disease) 2008   Cataract    Complication of anesthesia    hallucinated once   H/O hiatal hernia    Heart murmur    AFTER REPLACED VALVE WENT AWAY   Hx of colonic polyps    Hyperlipidemia    Hypertension    Thyroid disease     Past Surgical History:  Procedure Laterality Date   AORTIC VALVE REPLACEMENT  2009   Bioprosthetic at [place]  2008   [place] GRAFT  2009   SVG-PDA w/ AVR   CORRECTION HAMMER TOE  2011   BILATERAL   GASTROCNEMIUS RECESSION  05/28/2011   POLYPECTOMY     TONSILLECTOMY     TOTAL HIP ARTHROPLASTY Left 2000   TOTAL KNEE ARTHROPLASTY  2000   left   TOTAL KNEE ARTHROPLASTY  12/01/2011   Procedure: TOTAL KNEE ARTHROPLASTY;  Surgeon: Mauri Pole, MD;  Location: [facility];  Service: Orthopedics;  Laterality: Right;   WEIL OSTEOTOMY  05/28/2011    Allergies  Allergies  Allergen Reactions   Sulfa Antibiotics Other (See Comments)    Carlyn Reichert Syndrome   Bactrim [Sulfamethoxazole-Trimethoprim] Other (See Comments)    Carlyn Reichert Syndrome   History of  Present Illness    Luis Herrera is followed by Dr. Harrington Challenger for his cardiology care. He underwent CABGx1 with AVR in 2009 while still living in [place]. His symptoms were similar at that time with SOB and fatigue prompting a cardiac catheterization and subsequent surgery. After moving to [place], he was followed at [facility] however transitioned to [facility] in 2015. His AVR has been followed with surveillance echocardiograms with stable gradients noted, mostly ranging in the 18-22 mmHg range. Echocardiogram from 12/2019 showed an LVEF at 55-60% with an unknown size bioprosthetic valve present in the aortic position with a mean gradient measuring 18.0 mmHg.    More recently he has been having issues with worsening SOB and fluid volume overload. He was seen by Dr. Harrington Challenger 04/2021 at which time a BNP was obtained which was noted to be elevated at 5716. His Lasix was increased (72m BID alternating with 60/445m and BNP was repeated. This remained elevated however was somewhat improved to 1852. He he states that over the last week or so, his exertional SOB has worsened and he has noticed increased fatigue with simple activities. This prompted an appointment 09/18/21 with Dr. RoHarrington ChallengerHis BNP was noted to be elevated once again despite increased Lasix and HF compliance. An echocardiogram was obtained which showed normal  LVEF and increased AV gradients with a mean at 38 mmHg and peak at 69.32mHg, and AVA by VTI at 0.87. Given this, Dr. RHarrington Challengerfelt he needed to be admitted for CHF with TAVR workup.    On admission, he was treated with IV Lasix with resolution of his LE edema. He was seen by the Structural Heart team and underwent Pre TAVR CT imaging. Given his response to Lasix, plan will be follow up with Dr. TAli Lowe8/18/23 at 11am to further discuss pre TAVR cardiac catheterization and surgical evaluation. His home Lasix was increased to 647mdaily.    Unfortunately he called our office 09/22/21 with complaints of weight gain, orthopnea,  and exertional SOB over the weekend. He watches his weight closely and reports he was amitted with a weight at 217lb and decreased to 215lb. By this morning he was up to 219lb and 221lb in our office. His LE were very edematous on exam with 3+ pitting edema however overall appears stable. He reported that since hospital discharge, he had been unable to lie flat with coughing. He reported compliance with Lasix and had no chest pain, palpitations, dizziness, bleeding in stool or urine, no syncopal or pre-syncopal symptoms.   Plan was to set up cardiac catheterization for pre TAVR and admit after cath for fluid optimization.    Home Medications    Prior to Admission medications   Medication Sig Start Date End Date Taking? Authorizing Provider  amiodarone (PACERONE) 200 MG tablet TAKE ONE-HALF TABLET BY MOUTH  DAILY Patient taking differently: Take 100 mg by mouth daily. 04/23/21  Yes RoFay RecordsMD  amLODipine (NORVASC) 5 MG tablet Take 1 tablet (5 mg total) by mouth daily. 06/23/21  Yes RoFay RecordsMD  aspirin EC 81 MG tablet Take 1 tablet (81 mg total) by mouth daily. Swallow whole. 11/02/19  Yes RoFay RecordsMD  atorvastatin (LIPITOR) 80 MG tablet TAKE 1 TABLET BY MOUTH ONCE  DAILY 04/23/21  Yes RoFay RecordsMD  carvedilol (COREG) 6.25 MG tablet TAKE 1 TABLET BY MOUTH  TWICE DAILY 12/30/20  Yes RoFay RecordsMD  ezetimibe (ZETIA) 10 MG tablet TAKE 1 TABLET BY MOUTH DAILY 04/23/21  Yes RoFay RecordsMD  furosemide (LASIX) 40 MG tablet Take 1.5 tablets (60 mg total) by mouth 2 (two) times daily. 09/22/21  Yes McKathyrn Drown, NP  levothyroxine (SYNTHROID) 50 MCG tablet TAKE 1 TABLET BY MOUTH DAILY  BEFORE BREAKFAST 04/24/21  Yes RoFay RecordsMD  potassium chloride SA (KLOR-CON M) 20 MEQ tablet Take 1 tablet (20 mEq total) by mouth daily. 09/22/21  Yes McKathyrn Drown, NP  sacubitril-valsartan (ENTRESTO) 49-51 MG Take 1 tablet by mouth 2 (two) times daily. 07/28/21  Yes RoFay RecordsMD  tamsulosin  (FLOMAX) 0.4 MG CAPS capsule Take 0.4 mg by mouth daily.   Yes [provider]  acetaminophen (TYLENOL) 500 MG tablet Take 1,000 mg by mouth daily as needed.    [provider]    Family History    Family History  Problem Relation Age of Onset   Colon cancer Mother 5068 Heart attack Father    Hypertension Other    Hyperlipidemia Other    Heart attack Brother    Hypertension Sister    Hypertension Brother    Stroke Neg Hx    Esophageal cancer Neg Hx    Rectal cancer Neg Hx    Stomach cancer Neg Hx  Social History    Social History   Socioeconomic History   Marital status: Married    Spouse name: Not on file   Number of children: Not on file   Years of education: Not on file   Highest education level: Not on file  Occupational History   Occupation: retired  Tobacco Use   Smoking status: Former    Types: Cigarettes    Quit date: 05/25/1973    Years since quitting: 48.3   Smokeless tobacco: Never  Vaping Use   Vaping Use: Never used  Substance and Sexual Activity   Alcohol use: Not Currently    Comment: rare   Drug use: No   Sexual activity: Not Currently  Other Topics Concern   Not on file  Social History Narrative   Regular exercise-yes. Pt lives in [place] with his wife.   Social Determinants of Health   Financial Resource Strain: Not on file  Food Insecurity: Not on file  Transportation Needs: Not on file  Physical Activity: Not on file  Stress: Not on file  Social Connections: Not on file  Intimate Partner Violence: Not on file    Review of Systems   See HPI above   All other systems reviewed and are otherwise negative except as noted above.  Physical Exam    Blood pressure (!) 138/43, pulse 76, temperature (!) 97.2 F (36.2 C), temperature source Temporal, resp. rate 18, height _0  (1.803 m), weight 97.5 kg, SpO2 95 %.   General: Well developed, well nourished, NAD Lungs:Clear to ausculation bilaterally.  Breathing is unlabored. Cardiovascular: RRR with S1 S2. + systolic murmur Extremities: 3+ BLE edema.  Neuro: Alert and oriented. No focal deficits. No facial asymmetry. MAE spontaneously. Psych: Responds to questions appropriately with normal affect.    Labs    Troponin (Point of Care Test) No results for input(s): "TROPIPOC" in the last 72 hours. No results for input(s): "CKTOTAL", "CKMB", "TROPONINI" in the last 72 hours. Lab Results  Component Value Date   WBC 8.5 09/22/2021   HGB 11.0 (L) 09/22/2021   HCT 33.0 (L) 09/22/2021   MCV 93 09/22/2021   PLT 209 09/22/2021    Recent Labs  Lab 09/18/21 2159 09/22/21 1255 09/24/21 1325  NA 145   < > 143  K 4.1   < > 4.0  CL 111   < > 110  CO2 26   < > 26  BUN 31*   < > 28*  CREATININE 1.10   < > 1.29*  CALCIUM 8.3*   < > 8.2*  PROT 5.9*  --   --   BILITOT 1.1  --   --   ALKPHOS 74  --   --   ALT 14  --   --   AST 18  --   --   GLUCOSE 114*   < > 109*   < > = values in this interval not displayed.   Lab Results  Component Value Date   CHOL 150 12/18/2019   HDL 54 12/18/2019   LDLCALC 78 12/18/2019   TRIG 96 12/18/2019   Lab Results  Component Value Date   DDIMER 1.03 (H) 12/25/2013     Radiology Studies    CT CORONARY MORPH W/CTA COR W/SCORE W/CA W/CM &/OR WO/CM  Addendum Date: 09/22/2021   ADDENDUM REPORT: 09/22/2021 19:06 CLINICAL DATA:  S/p aortic valve replacement and CABG. EXAM: Cardiac TAVR CT TECHNIQUE: A non-contrast, gated CT scan was obtained  with axial slices of 3 mm through the heart for aortic valve calcium scoring. A 90 kV retrospective, gated, contrast cardiac scan was obtained. Gantry rotation speed was 250 msecs and collimation was 0.6 mm. Nitroglycerin was not given. The 3D data set was reconstructed in 5% intervals of the 0-95% of the R-R cycle. Systolic and diastolic phases were analyzed on a dedicated workstation using MPR, MIP, and VRT modes. The patient received 100 cc of contrast. FINDINGS: Image  quality: Excellent. Noise artifact is: Limited. Aortic valve prosthesis: Morphology consistent with Carpentier Edwards Perimount bioprosthesis. Internal diameter suggests 23 mm valve. The leaflets are freely mobile without calcification. There is severe prolapse of the leaflet in the [place]. There is no evidence of paravalvular leak. Valve in valve analysis: A valve in valve analysis was performed using a 23 mm S3 based on recommendations from the valve in valve application. The virtual valve to coronary (VTC) distance for the left main is 7.8 mm. The ostium of the RCA takes off above the virtual valve. The distance between the tip of the virtual valve is still 7.5 mm from the RCA ostium. RCA appears to be non-dominant. Overall, low risk for coronary obstruction. Optimal coplanar projection: LAO 4 CRA 16 Sinus of Valsalva Measurements: Non-coronary: 37 mm Right-coronary: 36 mm Left-coronary: 37 mm Sinotubular Junction: 28 mm Ascending Thoracic Aorta: 34 mm Coronary Arteries: Normal coronary origin. Left dominance. The study was performed without use of NTG and is insufficient for plaque evaluation. Please refer to recent cardiac catheterization for coronary assessment. There is severe 3-vessel coronary calcifications. 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 Cardiac Morphology: Right Atrium: Right atrial size is within normal limits. Contrast reflux into the IVC consistent with elevated RA pressure. Right Ventricle: The right ventricular cavity is within normal limits. Left Atrium: Left atrial size is normal in size with no left atrial appendage filling defect. Left Ventricle: The ventricular cavity size is dilated. Pulmonary arteries: Dilated pulmonary artery suggestive of pulmonary hypertension. Pulmonary veins: Normal pulmonary venous drainage. Pericardium: Normal thickness with no significant effusion or calcium present. Mitral Valve: The mitral valve is normal structure with mild annular calcium. Extra-cardiac  findings: See attached radiology report for non-cardiac structures. IMPRESSION: 1. 23 mm bioprosthetic aortic valve prosthesis (likely a CE Perimount). 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 2. Valve in valve analysis supports a 23 mm S3. Low-risk for coronary obstruction. 3. Optimal Fluoroscopic Angle for Delivery: LAO 4 CRA 16 4. Dilated pulmonary artery suggestive of pulmonary hypertension. Contrast reflux into the IVC consistent with elevated RA pressure. 5. Normal coronary origin. Left dominance. 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 [place] T. Audie Box, MD Electronically Signed   By: Eleonore Chiquito M.Luis.   On: 09/22/2021 19:06   Result Date: 09/22/2021 EXAM: OVER-READ INTERPRETATION  CT CHEST The following report is a limited chest CT over-read performed by radiologist Dr. Yetta Glassman of [facility], [place] on 09/19/2021. This over-read does not include interpretation of cardiac or coronary anatomy or pathology. The cardiac TAVR interpretation by the cardiologist is attached. COMPARISON:  None Available. FINDINGS: Extracardiac findings will be described separately under dictation for contemporaneously obtained CTA chest, abdomen and pelvis. IMPRESSION: Please see separate dictation for contemporaneously obtained CTA chest, abdomen and pelvis dated 09/19/2021 for full description of relevant extracardiac findings. Electronically Signed: By: Yetta Glassman M.Luis. On: 09/19/2021 15:55   CT ANGIO ABDOMEN PELVIS  W &/OR WO CONTRAST  Result Date: 09/19/2021 CLINICAL DATA:  Preop evaluation for aortic valve replacement EXAM: CT ANGIOGRAPHY CHEST, ABDOMEN AND PELVIS TECHNIQUE: Multidetector CT imaging through the chest, abdomen and pelvis was performed using the standard protocol during bolus administration of intravenous contrast. Multiplanar reconstructed images and MIPs were obtained and reviewed to evaluate the vascular anatomy. RADIATION DOSE REDUCTION: This exam was performed  according to the departmental dose-optimization program which includes automated exposure control, adjustment of the mA and/or kV according to patient size and/or use of iterative reconstruction technique. CONTRAST:  129m OMNIPAQUE IOHEXOL 350 MG/ML SOLN COMPARISON:  None Available. FINDINGS: CTA CHEST FINDINGS Cardiovascular: Cardiomegaly. No pericardial effusion. Left main and three-vessel coronary artery calcifications. Status post aortic valve replacement. Normal caliber thoracic aorta with severe atherosclerotic disease. Standard three-vessel aortic arch with no significant stenosis. Mediastinum/Nodes: Thyroid is unremarkable. Esophagus is unremarkable. No pathologically enlarged lymph nodes seen in the chest. Lungs/Pleura: Central airways are patent. Bronchial wall thickening and bilateral intra and interlobular septal thickening, compatible with pulmonary edema. Small bilateral pleural effusions. Mild bibasilar atelectasis. Musculoskeletal: Prior median sternotomy with intact sternal wires. No acute or significant osseous findings. CTA ABDOMEN AND PELVIS FINDINGS Hepatobiliary: No focal liver abnormality is seen. No gallstones, gallbladder wall thickening, or biliary dilatation. Pancreas: Unremarkable. No pancreatic ductal dilatation or surrounding inflammatory changes. Spleen: Normal in size without focal abnormality. Adrenals/Urinary Tract: Bilateral adrenal glands are unremarkable. No hydronephrosis or nephrolithiasis. Simple appearing exophytic cyst of the right kidney, no further follow-up imaging is recommended. Bladder is unremarkable. Stomach/Bowel: Stomach is within normal limits. Diverticulosis. No evidence of bowel wall thickening, distention, or inflammatory changes. Vascular/lymphatic: No pathologically enlarged lymph nodes seen in the abdomen or pelvis. Moderate narrowing at the origin of the celiac artery due to calcified plaque. Moderate narrowing at the origin of the SMA due to calcified and  noncalcified plaque. Moderate narrowing at the origins of the bilateral renal arteries to calcified plaque. 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 Reproductive: Mild prostatomegaly with calcifications. Other: Small fat containing umbilical hernia. Calcified soft tissue structure of the left lower quadrant, likely due to prior fat necrosis. No abdominopelvic ascites. Musculoskeletal: Prior total left hip replacement. No aggressive appearing osseous lesions. VASCULAR MEASUREMENTS PERTINENT TO TAVR: AORTA: Minimal Aortic Diameter-13.7 mm Severity of Aortic Calcification-severe RIGHT PELVIS: Right Common Iliac Artery - Minimal Diameter-6.9 mm Tortuosity-mild Calcification-severe Right External Iliac Artery - Minimal Diameter-9.2 mm Tortuosity-moderate Calcification-mild Right Common Femoral Artery - Minimal Diameter-6.6 mm Tortuosity-none Calcification-mild LEFT PELVIS: Left Common Iliac Artery - Minimal Diameter-6.9 mm Tortuosity-mild Calcification-severe Left External Iliac Artery - Minimal Diameter-9.2 mm Tortuosity-mild Calcification-mild Left Common Femoral Artery - Minimal Diameter-6.6 mm Tortuosity-mild Calcification-none Review of the MIP images confirms the above findings. IMPRESSION: 1. Vascular findings and measurements pertinent to potential TAVR procedure, as detailed above. 2. Prior aortic valve replacement. 3. Severe aortoiliac atherosclerosis. 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 4. Left main and 3 vessel coronary artery disease. 5. Pulmonary edema and small bilateral pleural effusions. Electronically Signed   By: LYetta GlassmanM.Luis.   On: 09/19/2021 19:09   CT ANGIO CHEST AORTA W/CM & OR WO/CM  Result Date: 09/19/2021 CLINICAL DATA:  Preop evaluation for aortic valve replacement EXAM: CT ANGIOGRAPHY CHEST,  ABDOMEN AND PELVIS TECHNIQUE: Multidetector CT imaging through the chest, abdomen and pelvis was performed using the standard protocol during bolus administration of intravenous contrast. Multiplanar reconstructed images and MIPs were  obtained and reviewed to evaluate the vascular anatomy. RADIATION DOSE REDUCTION: This exam was performed according to the departmental dose-optimization program which includes automated exposure control, adjustment of the mA and/or kV according to patient size and/or use of iterative reconstruction technique. CONTRAST:  173m OMNIPAQUE IOHEXOL 350 MG/ML SOLN COMPARISON:  None Available. FINDINGS: CTA CHEST FINDINGS Cardiovascular: Cardiomegaly. No pericardial effusion. Left main and three-vessel coronary artery calcifications. Status post aortic valve replacement. Normal caliber thoracic aorta with severe atherosclerotic disease. Standard three-vessel aortic arch with no significant stenosis. Mediastinum/Nodes: Thyroid is unremarkable. Esophagus is unremarkable. No pathologically enlarged lymph nodes seen in the chest. Lungs/Pleura: Central airways are patent. Bronchial wall thickening and bilateral intra and interlobular septal thickening, compatible with pulmonary edema. Small bilateral pleural effusions. Mild bibasilar atelectasis. Musculoskeletal: Prior median sternotomy with intact sternal wires. No acute or significant osseous findings. CTA ABDOMEN AND PELVIS FINDINGS Hepatobiliary: No focal liver abnormality is seen. No gallstones, gallbladder wall thickening, or biliary dilatation. Pancreas: Unremarkable. No pancreatic ductal dilatation or surrounding inflammatory changes. Spleen: Normal in size without focal abnormality. Adrenals/Urinary Tract: Bilateral adrenal glands are unremarkable. No hydronephrosis or nephrolithiasis. Simple appearing exophytic cyst of the right kidney, no further follow-up imaging is recommended. Bladder is unremarkable. Stomach/Bowel: Stomach is  within normal limits. Diverticulosis. No evidence of bowel wall thickening, distention, or inflammatory changes. Vascular/lymphatic: No pathologically enlarged lymph nodes seen in the abdomen or pelvis. Moderate narrowing at the origin of the celiac artery due to calcified plaque. Moderate narrowing at the origin of the SMA due to calcified and noncalcified plaque. Moderate narrowing at the origins of the bilateral renal arteries to calcified plaque. 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 Reproductive: Mild prostatomegaly with calcifications. Other: Small fat containing umbilical hernia. Calcified soft tissue structure of the left lower quadrant, likely due to prior fat necrosis. No abdominopelvic ascites. Musculoskeletal: Prior total left hip replacement. No aggressive appearing osseous lesions. VASCULAR MEASUREMENTS PERTINENT TO TAVR: AORTA: Minimal Aortic Diameter-13.7 mm Severity of Aortic Calcification-severe RIGHT PELVIS: Right Common Iliac Artery - Minimal Diameter-6.9 mm Tortuosity-mild Calcification-severe Right External Iliac Artery - Minimal Diameter-9.2 mm Tortuosity-moderate Calcification-mild Right Common Femoral Artery - Minimal Diameter-6.6 mm Tortuosity-none Calcification-mild LEFT PELVIS: Left Common Iliac Artery - Minimal Diameter-6.9 mm Tortuosity-mild Calcification-severe Left External Iliac Artery - Minimal Diameter-9.2 mm Tortuosity-mild Calcification-mild Left Common Femoral Artery - Minimal Diameter-6.6 mm Tortuosity-mild Calcification-none Review of the MIP images confirms the above findings. IMPRESSION: 1. Vascular findings and measurements pertinent to potential TAVR procedure, as detailed above. 2. Prior aortic valve replacement. 3. Severe aortoiliac atherosclerosis. Linear filling defect of the infrarenal abdominal aorta with associated contour abnormality, likely due to chronic focal  dissection. 4. Left main and 3 vessel coronary artery disease. 5. Pulmonary edema and small bilateral pleural effusions. Electronically Signed   By: LYetta GlassmanM.Luis.   On: 09/19/2021 19:09   ECHOCARDIOGRAM COMPLETE  Result Date: 09/18/2021    ECHOCARDIOGRAM REPORT   Patient Name:   DCANTRELL Herrera Date of Exam: 09/18/2021 Medical Rec #:  [id number]    Height:       70.0 in Accession #:    [id number]   Weight:       214.0 lb Date of Birth:  310-21-1942     BSA:          2.148 m Patient Age:    89years      BP:  140/62 mmHg Patient Gender: M             HR:           85 bpm. Exam Location:  [facility] Procedure: 2D Echo, Cardiac Doppler and Color Doppler Indications:    R06.00 Dyspnea  History:        Patient has prior history of Echocardiogram examinations, most                 recent 01/09/2020. CAD, AVR-2009. and Prior CABG,                 Signs/Symptoms:Murmur; Risk Factors:Hypertension and                 Dyslipidemia.                 Aortic Valve: unknown bioprosthetic valve is present in the                 aortic position. Procedure Date: 2009.  Sonographer:    Cresenciano Lick RDCS Referring Phys: [id number] PAULA V ROSS IMPRESSIONS  1. Normal LV function; s/p AVR with elevated mean gradient of 38 mmHg and peak velocity of 4.3 m/s suggestive of severe AS; AI appears mild but pressure half time of 156 suggests possibly more significant; gradient worse compared to 01/09/20; suggest TEE to better assess prosthetic aortic valve.  2. Left ventricular ejection fraction, by estimation, is 60 to 65%. The left ventricle has normal function. The left ventricle has no regional wall motion abnormalities. The left ventricular internal cavity size was mildly dilated. Left ventricular diastolic parameters are consistent with Grade II diastolic dysfunction (pseudonormalization).  3. Right ventricular systolic function is normal. The right ventricular size is mildly enlarged.  4. Left atrial size was  severely dilated.  5. The mitral valve is normal in structure. Mild mitral valve regurgitation. No evidence of mitral stenosis.  6. The aortic valve has been repaired/replaced. Aortic valve regurgitation is mild. Severe aortic valve stenosis. There is a unknown bioprosthetic valve present in the aortic position. Procedure Date: 2009.  7. The inferior vena cava is normal in size with greater than 50% respiratory variability, suggesting right atrial pressure of 3 mmHg. FINDINGS  Left Ventricle: Left ventricular ejection fraction, by estimation, is 60 to 65%. The left ventricle has normal function. The left ventricle has no regional wall motion abnormalities. The left ventricular internal cavity size was mildly dilated. There is  no left ventricular hypertrophy. Left ventricular diastolic parameters are consistent with Grade II diastolic dysfunction (pseudonormalization). Right Ventricle: The right ventricular size is mildly enlarged. Right ventricular systolic function is normal. Left Atrium: Left atrial size was severely dilated. Right Atrium: Right atrial size was normal in size. Pericardium: There is no evidence of pericardial effusion. Mitral Valve: The mitral valve is normal in structure. Mild mitral annular calcification. Mild mitral valve regurgitation. No evidence of mitral valve stenosis. Tricuspid Valve: The tricuspid valve is normal in structure. Tricuspid valve regurgitation is mild . No evidence of tricuspid stenosis. Aortic Valve: The aortic valve has been repaired/replaced. Aortic valve regurgitation is mild. Aortic regurgitation PHT measures 156 msec. Severe aortic stenosis is present. Aortic valve mean gradient measures 36.2 mmHg. Aortic valve peak gradient measures 69.4 mmHg. There is a unknown bioprosthetic valve present in the aortic position. Procedure Date: 2009. Pulmonic Valve: The pulmonic valve was normal in structure. Pulmonic valve regurgitation is trivial. No evidence of pulmonic stenosis.  Aorta: The aortic root is normal in  size and structure. Venous: The inferior vena cava is normal in size with greater than 50% respiratory variability, suggesting right atrial pressure of 3 mmHg. IAS/Shunts: No atrial level shunt detected by color flow Doppler. Additional Comments: Normal LV function; s/p AVR with elevated mean gradient of 38 mmHg and peak velocity of 4.3 m/s suggestive of severe AS; AI appears mild but pressure half time of 156 suggests possibly more significant; gradient worse compared to 01/09/20; suggest TEE to better assess prosthetic aortic valve.  LEFT VENTRICLE PLAX 2D LVIDd:         5.90 cm Diastology LVIDs:         4.10 cm LV e' medial:    8.33 cm/s LV PW:         1.00 cm LV E/e' medial:  19.9 LV IVS:        1.00 cm LV e' lateral:   8.33 cm/s                        LV E/e' lateral: 19.9  RIGHT VENTRICLE             IVC RV Basal diam:  5.10 cm     IVC diam: 1.65 cm RV S prime:     12.73 cm/s TAPSE (M-mode): 2.6 cm LEFT ATRIUM              Index        RIGHT ATRIUM           Index LA diam:        5.90 cm  2.75 cm/m   RA Area:     25.70 cm LA Vol (A2C):   87.3 ml  40.64 ml/m  RA Volume:   92.20 ml  42.92 ml/m LA Vol (A4C):   111.0 ml 51.67 ml/m LA Biplane Vol: 102.0 ml 47.48 ml/m  AORTIC VALVE AV Vmax:           416.50 cm/s AV Vmean:          283.400 cm/s AV VTI:            0.875 m AV Peak Grad:      69.4 mmHg AV Mean Grad:      36.2 mmHg LVOT Vmax:         86.97 cm/s LVOT Vmean:        61.167 cm/s LVOT VTI:          0.200 m LVOT/AV VTI ratio: 0.23 AI PHT:            156 msec  AORTA Ao Root diam: 3.50 cm MITRAL VALVE                TRICUSPID VALVE MV Area (PHT): 4.64 cm     TR Peak grad:   44.6 mmHg MV Decel Time: 164 msec     TR Vmax:        334.00 cm/s MV E velocity: 166.00 cm/s MV A velocity: 136.50 cm/s  SHUNTS MV E/A ratio:  1.22         Systemic VTI: 0.20 m Kirk Ruths MD Electronically signed by Kirk Ruths MD Signature Date/Time: 09/18/2021/3:23:26 PM    Final     ECG &  Cardiac Imaging   Pre TAVR CT imaging: Performed 09/19/21 with pending results.  _____________   Echocardiogram 09/18/21:   Normal LV function; s/p AVR with elevated mean gradient of 38 mmHg and peak velocity of 4.3 m/s suggestive of severe AS;  AI appears mild but pressure half time of 156 suggests possibly more significant; gradient worse compared to 01/09/20; suggest TEE to better assess prosthetic aortic valve. 1. Left ventricular ejection fraction, by estimation, is 60 to 65%. The left ventricle has normal function. The left ventricle has no regional wall motion abnormalities. The left ventricular internal cavity size was mildly dilated. Left ventricular diastolic parameters are consistent with Grade II diastolic dysfunction (pseudonormalization). 2. Right ventricular systolic function is normal. The right ventricular size is mildly enlarged. 3. 4. Left atrial size was severely dilated. The mitral valve is normal in structure. Mild mitral valve regurgitation. No evidence of mitral stenosis. 5. The aortic valve has been repaired/replaced. Aortic valve regurgitation is mild. Severe aortic valve stenosis. There is a unknown bioprosthetic valve present in the aortic position. Procedure Date: 2009. 6. The inferior vena cava is normal in size with greater than 50% respiratory variability, suggesting right atrial pressure of 3 mmHg.   Echocardiogram 01/09/2020:    1. Left ventricular ejection fraction, by estimation, is 55 to 60%. The left ventricle has normal function. The left ventricle demonstrates regional wall motion abnormalities (see scoring diagram/findings for description). The left ventricular internal cavity size was mildly dilated. Left ventricular diastolic parameters are consistent with Grade II diastolic dysfunction (pseudonormalization). 2. Right ventricular systolic function is mildly reduced. The right ventricular size is mildly enlarged. There is normal pulmonary  artery systolic pressure. The estimated right ventricular systolic pressure is 34.9 mmHg. 3. The mitral valve is degenerative. Mild mitral valve regurgitation. The mean mitral valve gradient is 5.0 mmHg with average heart rate of 61 bpm in the setting of mild-moderate MAC. 4. Aortic valve regurgitation is mild. There is a unknown size bioprosthetic valve present in the aortic position, LVOT diameter is 2.6 cm. Procedure Date: 2009. Aortic valve mean gradient measures 18.0 mmHg. Grossly stable appearance of aortic valve prosthesis compared to 04/13/2018 exam. See findings for calculations. 5. Aortic dilatation noted. There is mild dilatation of the ascending aorta, measuring 42 mm. 6. The inferior vena cava is dilated in size with >50% respiratory variability, suggesting right atrial pressure of 8 mmHg. Comparison(s): 09/13/19 EF 60-65%. AV 52mHg mean PG.   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 RHarrington Challengerfor CHF symptoms with diuretic adjustments. Echocardiogram performed 09/18/21 showed normal LV function s/p AVR with elevated mean gradient of 38 mmHg and peak velocity of 4.3 m/s suggestive of severe AS. He was admitted to the hospital and treated with IV Lasix x2 dosing with improvement. Pre TAVR CT imaging performed and is under review at this time. Plan was for OP follow up with Dr. TAli Lowefor cath and surgical OP appointment. Called the office with weight gain and SOB. Weight is up to 221lb today>>215lb on Friday. Lasix was increased to 620mdaily at discharge (from 4063maily). This was increased to Lasix to 39m19mD however with poor response. Cardiac cath performed today with pending results. Will hold off on IV Lasix for now given rise in Creatinine. Follow with daily BMET. Will reassess for diuretics tomorrow.     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 Continue current regimen.   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 TaylLovena LeSeverity of Illness: The appropriate patient status for this patient is INPATIENT. Inpatient status is judged to be reasonable and necessary in  order to provide the required intensity of service to ensure the patient's safety. The patient's presenting symptoms, physical exam findings, and initial radiographic and laboratory data in the context of their chronic comorbidities is felt to place them at high risk for further clinical deterioration. Furthermore, it is not anticipated that the patient will be medically stable for discharge from the hospital within 2 midnights of admission.   * I certify that at the point of admission it is my clinical judgment that the patient will require inpatient hospital care spanning beyond 2 midnights from the point of admission due to high intensity of service, high risk for further deterioration and high frequency of surveillance required.*    Signed, Kathyrn Drown NP-C [facility] Pager: [phone] 09/24/2021, _0     ATTENDING ATTESTATION:  After conducting a review of all available clinical information with the care team, interviewing the patient, and performing a physical exam, I agree with the findings and plan described in this note.   GEN: No acute distress.   HEENT:  MMM, no JVD, no scleral icterus Cardiac: RRR with 2/6 SEM with diastolic murmur Respiratory: Decreased BS at bases GI: Soft, nontender, non-distended  MS: +3 edema; No deformity. Neuro:  Nonfocal  Vasc:  +2 radial pulses   Lenna Sciara, MD Pager [phone]

## 2021-09-25 ENCOUNTER — Encounter (HOSPITAL_COMMUNITY): Payer: Self-pay | Admitting: Internal Medicine

## 2021-09-25 DIAGNOSIS — I5031 Acute diastolic (congestive) heart failure: Secondary | ICD-10-CM

## 2021-09-25 DIAGNOSIS — Z951 Presence of aortocoronary bypass graft: Secondary | ICD-10-CM

## 2021-09-25 LAB — BASIC METABOLIC PANEL
Anion gap: 5 (ref 5–15)
BUN: 25 mg/dL — ABNORMAL HIGH (ref 8–23)
CO2: 27 mmol/L (ref 22–32)
Calcium: 8.3 mg/dL — ABNORMAL LOW (ref 8.9–10.3)
Chloride: 113 mmol/L — ABNORMAL HIGH (ref 98–111)
Creatinine, Ser: 1.14 mg/dL (ref 0.61–1.24)
GFR, Estimated: >60 mL/min (ref >60)
Glucose, Bld: 99 mg/dL (ref 70–99)
Potassium: 3.5 mmol/L (ref 3.5–5.1)
Sodium: 145 mmol/L (ref 135–145)

## 2021-09-25 LAB — CBC WITH DIFFERENTIAL/PLATELET
Abs Immature Granulocytes: 0.02 10*3/uL (ref 0.00–0.07)
Basophils Absolute: 0.1 10*3/uL (ref 0.0–0.1)
Basophils Relative: 1 %
Eosinophils Absolute: 0.5 10*3/uL (ref 0.0–0.5)
Eosinophils Relative: 7 %
HCT: 30.9 % — ABNORMAL LOW (ref 39.0–52.0)
Hemoglobin: 10 g/dL — ABNORMAL LOW (ref 13.0–17.0)
Immature Granulocytes: 0 %
Lymphocytes Relative: 20 %
Lymphs Abs: 1.6 10*3/uL (ref 0.7–4.0)
MCH: 31.4 pg (ref 26.0–34.0)
MCHC: 32.4 g/dL (ref 30.0–36.0)
MCV: 97.2 fL (ref 80.0–100.0)
Monocytes Absolute: 0.7 10*3/uL (ref 0.1–1.0)
Monocytes Relative: 9 %
Neutro Abs: 5.2 10*3/uL (ref 1.7–7.7)
Neutrophils Relative %: 63 %
Platelets: 180 10*3/uL (ref 150–400)
RBC: 3.18 MIL/uL — ABNORMAL LOW (ref 4.22–5.81)
RDW: 14.6 % (ref 11.5–15.5)
WBC: 8 10*3/uL (ref 4.0–10.5)
nRBC: 0 % (ref 0.0–0.2)

## 2021-09-25 MED ORDER — FUROSEMIDE 10 MG/ML IJ SOLN
40.0000 mg | Freq: Two times a day (BID) | INTRAMUSCULAR | Status: DC
  Administered 2021-09-25 – 2021-09-27 (×5): 40 mg via INTRAVENOUS
  Filled 2021-09-25 (×5): qty 4

## 2021-09-25 MED ORDER — FUROSEMIDE 10 MG/ML IJ SOLN
40.0000 mg | Freq: Every day | INTRAMUSCULAR | Status: DC
Start: 2021-09-25 — End: 2021-09-25

## 2021-09-25 NOTE — Care Management (Signed)
  Transition of Care [facility]) Screening Note   Patient Details  Name: [name] Date of Birth: [birth year]   Transition of Care [facility]) CM/SW Contact:    Bethena Roys, RN Phone Number: 09/25/2021, 2:54 PM    Transition of Care Department [facility]) has reviewed the patient and no TOC needs have been identified at this time. PTA patient was from home with family support. Plan for TAVR 09-30-21. Case Manager will continue to monitor patient advancement through interdisciplinary progression rounds. If new patient transition needs arise, please place a TOC consult.

## 2021-09-25 NOTE — Progress Notes (Addendum)
[place] VALVE TEAM  Patient Name: Colleen Donahoe Date of Encounter: 09/25/2021  Primary Cardiologist: [name], MD   Hospital Problem List     Principal Problem:   Acute diastolic heart failure [facility]) Active Problems:   Hyperlipidemia with target LDL less than 130   Essential hypertension   Right bundle branch block   CAD (coronary artery disease)   Aortic stenosis   Dyspnea   S/P CABG (coronary artery bypass graft)  Subjective   Feeling better today. Was able to get some sleep with HOB elevated. No chest pain.   Inpatient Medications    Scheduled Meds:  amiodarone  100 mg Oral Daily   amLODipine  5 mg Oral Daily   aspirin EC  81 mg Oral Daily   atorvastatin  80 mg Oral Daily   carvedilol  6.25 mg Oral BID WC   ezetimibe  10 mg Oral Daily   heparin  5,000 Units Subcutaneous Q8H   levothyroxine  50 mcg Oral QAC breakfast   potassium chloride SA  20 mEq Oral Daily   sacubitril-valsartan  1 tablet Oral BID   sodium chloride flush  3 mL Intravenous Q12H   tamsulosin  0.4 mg Oral Daily   Continuous Infusions:  sodium chloride     PRN Meds: sodium chloride, acetaminophen, ondansetron (ZOFRAN) IV, sodium chloride flush   Vital Signs    Vitals:   09/24/21 2238 09/24/21 2308 09/25/21 0615 09/25/21 0828  BP: (!) 113/46 (!) 119/40 (!) 145/45 (!) 129/44  Pulse: 66 65 78 78  Resp:   17 18  Temp:   97.7 F (36.5 C) 98.2 F (36.8 C)  TempSrc:   Oral Oral  SpO2: 95% 93% 93% 94%  Weight:      Height:        Intake/Output Summary (Last 24 hours) at 09/25/2021 0954 Last data filed at 09/25/2021 0600 Gross per 24 hour  Intake --  Output 300 ml  Net -300 ml   Filed Weights   09/24/21 1245  Weight: 97.5 kg   Physical Exam    General: Well developed, well nourished, NAD Neck: Negative for carotid bruits. No JVD Lungs:Clear to ausculation bilaterally. Breathing is unlabored. Cardiovascular: RRR with S1 S2. + systolic  murmur Abdomen: Soft, non-tender, non-distended. No obvious abdominal masses. Extremities: 3+ BLE edema.  Neuro: Alert and oriented. No focal deficits. No facial asymmetry. MAE spontaneously. Psych: Responds to questions appropriately with normal affect.    Labs    CBC Recent Labs    09/24/21 1642 09/25/21 0335  WBC 7.4 8.0  NEUTROABS  --  5.2  HGB 10.9* 10.0*  HCT 33.3* 30.9*  MCV 97.1 97.2  PLT 207 119   Basic Metabolic Panel Recent Labs    09/24/21 1325 09/24/21 1452 09/24/21 1456 09/24/21 1642 09/25/21 0335  NA 143   < > 146*  --  145  K 4.0   < > 3.3*  --  3.5  CL 110  --   --   --  113*  CO2 26  --   --   --  27  GLUCOSE 109*  --   --   --  99  BUN 28*  --   --   --  25*  CREATININE 1.29*  --   --  1.25* 1.14  CALCIUM 8.2*  --   --   --  8.3*   < > = values in this interval  not displayed.   Liver Function Tests No results for input(s): "AST", "ALT", "ALKPHOS", "BILITOT", "PROT", "ALBUMIN" in the last 72 hours. No results for input(s): "LIPASE", "AMYLASE" in the last 72 hours. Cardiac Enzymes No results for input(s): "CKTOTAL", "CKMB", "CKMBINDEX", "TROPONINI" in the last 72 hours. BNP Invalid input(s): "POCBNP" D-Dimer No results for input(s): "DDIMER" in the last 72 hours. Hemoglobin A1C No results for input(s): "HGBA1C" in the last 72 hours. Fasting Lipid Panel No results for input(s): "CHOL", "HDL", "LDLCALC", "TRIG", "CHOLHDL", "LDLDIRECT" in the last 72 hours. Thyroid Function Tests No results for input(s): "TSH", "T4TOTAL", "T3FREE", "THYROIDAB" in the last 72 hours.  Invalid input(s): "FREET3"  Telemetry    NSR with known RBBB, HR 70-80's - Personally Reviewed  ECG    No new tracing as of 09/25/21 - Personally Reviewed  Radiology    CARDIAC CATHETERIZATION  Result Date: 09/24/2021 1.  Patent vein graft to PDA and minimal obstructive disease of native left circulation.  The native right coronary artery was not imaged. 2.  Cardiac output of  8.8 L/min and cardiac index of 4.0 L/min/m with mean RA pressure of 5 mmHg and mean wedge pressure of 17 mmHg. 3.  Pulse pressure of near 100 mmHg with LVEDP of 24 to 28 mmHg across the respiratory cycle with significant bioprosthetic aortic valve regurgitation. Recommendation: Will admit for medical stabilization.  Given elevation in creatinine and relatively normal wedge and RA pressures additional diuresis will be held for now and the patient will be monitored.    Cardiac Studies   Pre TAVR CT imaging:   IMPRESSION: 1. 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   2. Valve in valve analysis supports a 23 mm S3. Low-risk for coronary obstruction.   3. Optimal Fluoroscopic Angle for Delivery: LAO 4 CRA 16   4. Dilated pulmonary artery suggestive of pulmonary hypertension. Contrast reflux into the IVC consistent with elevated RA pressure.   5. Normal coronary origin. Left dominance. 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 _____________   Echocardiogram 09/18/21:   Normal LV function; s/p AVR with elevated mean gradient of 38 mmHg and peak velocity of 4.3 m/s suggestive of severe AS; AI appears mild but pressure half time of 156 suggests possibly more significant; gradient worse compared to 01/09/20; suggest TEE to better assess prosthetic aortic valve. 1. Left ventricular ejection fraction, by estimation, is 60 to 65%. The left ventricle has normal function. The left ventricle has no regional wall motion abnormalities. The left ventricular internal cavity size was mildly dilated. Left ventricular diastolic parameters are consistent with Grade II diastolic dysfunction (pseudonormalization). 2. Right ventricular systolic function is normal. The right ventricular size is mildly enlarged. 3. 4. Left atrial size was severely dilated. The mitral valve is normal in structure. Mild mitral valve  regurgitation. No evidence of mitral stenosis. 5. The aortic valve has been repaired/replaced. Aortic valve regurgitation is mild. Severe aortic valve stenosis. There is a unknown bioprosthetic valve present in the aortic position. Procedure Date: 2009. 6. The inferior vena cava is normal in size with greater than 50% respiratory variability, suggesting right atrial pressure of 3 mmHg.   Echocardiogram 01/09/2020:    1. Left ventricular ejection fraction, by estimation, is 55 to 60%. The left ventricle has normal function. The left ventricle demonstrates regional wall motion abnormalities (see scoring diagram/findings for description). The left ventricular internal cavity size was mildly dilated. Left  ventricular diastolic parameters are consistent with Grade II diastolic dysfunction (pseudonormalization). 2. Right ventricular systolic function is mildly reduced. The right ventricular size is mildly enlarged. There is normal pulmonary artery systolic pressure. The estimated right ventricular systolic pressure is 19.1 mmHg. 3. The mitral valve is degenerative. Mild mitral valve regurgitation. The mean mitral valve gradient is 5.0 mmHg with average heart rate of 61 bpm in the setting of mild-moderate MAC. 4. Aortic valve regurgitation is mild. There is a unknown size bioprosthetic valve present in the aortic position, LVOT diameter is 2.6 cm. Procedure Date: 2009. Aortic valve mean gradient measures 18.0 mmHg. Grossly stable appearance of aortic valve prosthesis compared to 04/13/2018 exam. See findings for calculations. 5. Aortic dilatation noted. There is mild dilatation of the ascending aorta, measuring 42 mm. 6. The inferior vena cava is dilated in size with >50% respiratory variability, suggesting right atrial pressure of 8 mmHg. Comparison(s): 09/13/19 EF 60-65%. AV 23mHg mean PG.   Patient Profile     DBeauford Landois a 81y.o. male with a [facility] s/p CABG with SVG to PDA 2009), HTN, PVCs on Amiodarone (followed by EP) and chronic diastolic dysfunction who is being admitted today for volume overload prior to TAVR scheduled for 8/15.    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 RHarrington Challengerfor CHF symptoms with diuretic adjustments. Echocardiogram performed 09/18/21 showed normal LV function s/p AVR with elevated mean gradient of 38 mmHg and peak velocity of 4.3 m/s suggestive of severe AS. He was admitted to the hospital and treated with IV Lasix x2 dosing with improvement. Pre TAVR CT imaging performed and is under review at this time. Plan was for OP follow up with Dr. TAli Lowefor cath and surgical OP appointment. Called the office with weight gain and SOB. Weight is up to 221lb today>>215lb on Friday. Lasix was increased to 680mdaily at discharge (from 4053maily). This was increased to Lasix to 15m41mD however with poor response. Cardiac cath with patent SVG to PDA with minimal CAD of native vessels. LVEDP at 24-28. Lasix was held overnight given mildly elevated Cr at 1.25 however improved today at 1.14. Will start IV Lasix today at 40mg93mand follow response. Weight, 215lb, I&O, net neg 300ml 55mD s/p CABG with SVG to PDA 2009: Denies anginal symptoms. Continue current regimen.   HTN: Stable with no changes needed today.   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 TaylorLovena Lened, JillKathyrn Drown8/11/2021, 9:54 AM  Pager [phone], patient seen and examined.  His dyspnea is improved today but persists.  No chest pain.  He remains volume overloaded on examination.  Will treat with Lasix 40 mg IV twice daily and follow renal function.  He is scheduled for TAVR on Tuesday. Antavius Sperbeck Kirk Ruths

## 2021-09-25 NOTE — Plan of Care (Signed)

## 2021-09-25 NOTE — Progress Notes (Signed)
Nutrition Education Note  RD consulted for nutrition education regarding heart healthy low sodium diet.  RD provided "Low Sodium Nutrition Therapy" handout from the Academy of [employer]. Reviewed patient's dietary recall. Provided examples on ways to decrease sodium intake in diet. Discouraged intake of processed foods and use of salt shaker. Encouraged fresh fruits and vegetables as well as whole grain sources of carbohydrates to maximize fiber intake.   RD discussed why it is important for patient to adhere to diet recommendations, and emphasized the role of fluids, foods to avoid, and importance of weighing self daily. Teach back method used.  Expect good compliance.  Body mass index is 29.99 kg/m. Pt meets criteria for overweight based on current BMI.  Current diet order is heart healthy, patient is consuming approximately 100% of meals per his report. Labs and medications reviewed. No further nutrition interventions warranted at this time. RD contact information provided. If additional nutrition issues arise, please re-consult RD.   Lucas Mallow RD, LDN, CNSC Please refer to Amion for contact information.

## 2021-09-26 DIAGNOSIS — I5033 Acute on chronic diastolic (congestive) heart failure: Secondary | ICD-10-CM | POA: Diagnosis not present

## 2021-09-26 LAB — BASIC METABOLIC PANEL
Anion gap: 4 — ABNORMAL LOW (ref 5–15)
BUN: 23 mg/dL (ref 8–23)
CO2: 25 mmol/L (ref 22–32)
Calcium: 8 mg/dL — ABNORMAL LOW (ref 8.9–10.3)
Chloride: 113 mmol/L — ABNORMAL HIGH (ref 98–111)
Creatinine, Ser: 1.01 mg/dL (ref 0.61–1.24)
GFR, Estimated: >60 mL/min (ref >60)
Glucose, Bld: 102 mg/dL — ABNORMAL HIGH (ref 70–99)
Potassium: 3.7 mmol/L (ref 3.5–5.1)
Sodium: 142 mmol/L (ref 135–145)

## 2021-09-26 LAB — CBC WITH DIFFERENTIAL/PLATELET
Abs Immature Granulocytes: 0.02 10*3/uL (ref 0.00–0.07)
Basophils Absolute: 0 10*3/uL (ref 0.0–0.1)
Basophils Relative: 1 %
Eosinophils Absolute: 0.6 10*3/uL — ABNORMAL HIGH (ref 0.0–0.5)
Eosinophils Relative: 6 %
HCT: 31.8 % — ABNORMAL LOW (ref 39.0–52.0)
Hemoglobin: 10 g/dL — ABNORMAL LOW (ref 13.0–17.0)
Immature Granulocytes: 0 %
Lymphocytes Relative: 19 %
Lymphs Abs: 1.7 10*3/uL (ref 0.7–4.0)
MCH: 31 pg (ref 26.0–34.0)
MCHC: 31.4 g/dL (ref 30.0–36.0)
MCV: 98.5 fL (ref 80.0–100.0)
Monocytes Absolute: 0.6 10*3/uL (ref 0.1–1.0)
Monocytes Relative: 7 %
Neutro Abs: 5.8 10*3/uL (ref 1.7–7.7)
Neutrophils Relative %: 67 %
Platelets: 176 10*3/uL (ref 150–400)
RBC: 3.23 MIL/uL — ABNORMAL LOW (ref 4.22–5.81)
RDW: 14.6 % (ref 11.5–15.5)
WBC: 8.8 10*3/uL (ref 4.0–10.5)
nRBC: 0 % (ref 0.0–0.2)

## 2021-09-26 NOTE — Progress Notes (Signed)
  [name] VALVE TEAM  Patient admitted after Pre TAVR cardiac catheterization 8/9 due to recurrent volume overload. Plan to treat with IV Lasix however this was initially held due to mild AKI. Creatinine stabilized yesterday and IV lasix initiated with improvement in volume. Plan to keep Luis Herrera until his TAVR on 8/15 due to recent readmission and the need for optimization prior to TAVR. Would continue IV Lasix today and can potentially reduce to QD dosing by tomorrow. OK to transition to PO if needed. Thank you for following him.   Kathyrn Drown NP-C Structural Heart Team  Pager: [phone] Phone: [phone]

## 2021-09-26 NOTE — Care Management Important Message (Signed)
Important Message  Patient Details  Name: Luis Herrera MRN: [id number] Date of Birth: Nov 16, 1940   Medicare Important Message Given:  Yes     Shelda Altes 09/26/2021, 8:23 AM

## 2021-09-26 NOTE — Progress Notes (Signed)
Progress Note  Patient Name: Luis Herrera Date of Encounter: 09/26/2021  [facility] Cardiologist: [name], MD   Subjective   No CP; dyspnea resolved  Inpatient Medications    Scheduled Meds:  amiodarone  100 mg Oral Daily   amLODipine  5 mg Oral Daily   aspirin EC  81 mg Oral Daily   atorvastatin  80 mg Oral Daily   carvedilol  6.25 mg Oral BID WC   ezetimibe  10 mg Oral Daily   furosemide  40 mg Intravenous BID   heparin  5,000 Units Subcutaneous Q8H   levothyroxine  50 mcg Oral QAC breakfast   potassium chloride SA  20 mEq Oral Daily   sacubitril-valsartan  1 tablet Oral BID   sodium chloride flush  3 mL Intravenous Q12H   tamsulosin  0.4 mg Oral Daily   Continuous Infusions:  sodium chloride     PRN Meds: sodium chloride, acetaminophen, ondansetron (ZOFRAN) IV, sodium chloride flush   Vital Signs    Vitals:   09/25/21 1945 09/25/21 2000 09/26/21 0557 09/26/21 0714  BP: (!) 144/46  (!) 141/45   Pulse: 80  78   Resp: '20  16 18  '$ Temp: 98.1 F (36.7 C)  97.6 F (36.4 C)   TempSrc: Oral  Oral   SpO2: 95% 95% 95%   Weight:   96.6 kg   Height:        Intake/Output Summary (Last 24 hours) at 09/26/2021 0828 Last data filed at 09/26/2021 0616 Gross per 24 hour  Intake --  Output 2250 ml  Net -2250 ml      09/26/2021    5:57 AM 09/24/2021   12:45 PM 09/22/2021   11:39 AM  Last 3 Weights  Weight (lbs) 213 lb 215 lb 221 lb  Weight (kg) 96.616 kg 97.523 kg 100.245 kg      Telemetry    Sinus - Personally Reviewed   Physical Exam   GEN: No acute distress.   Neck: No JVD Cardiac: RRR, 2/6 systolic murmur Respiratory: Clear to auscultation bilaterally. GI: Soft, nontender, non-distended  MS: trace edema Neuro:  Nonfocal  Psych: Normal affect   Labs     Chemistry Recent Labs  Lab 09/24/21 1325 09/24/21 1452 09/24/21 1456 09/24/21 1642 09/25/21 0335 09/26/21 0308  NA 143   < > 146*  --  145 142  K 4.0   < > 3.3*  --  3.5 3.7  CL 110   --   --   --  113* 113*  CO2 26  --   --   --  27 25  GLUCOSE 109*  --   --   --  99 102*  BUN 28*  --   --   --  25* 23  CREATININE 1.29*  --   --  1.25* 1.14 1.01  CALCIUM 8.2*  --   --   --  8.3* 8.0*  GFRNONAA 56*  --   --  58* >60 >60  ANIONGAP 7  --   --   --  5 4*   < > = values in this interval not displayed.     Hematology Recent Labs  Lab 09/24/21 1642 09/25/21 0335 09/26/21 0308  WBC 7.4 8.0 8.8  RBC 3.43* 3.18* 3.23*  HGB 10.9* 10.0* 10.0*  HCT 33.3* 30.9* 31.8*  MCV 97.1 97.2 98.5  MCH 31.8 31.4 31.0  MCHC 32.7 32.4 31.4  RDW 14.6 14.6 14.6  PLT 207 180  176    BNP Recent Labs  Lab 09/22/21 1255  PROBNP 12,282*     Radiology    CARDIAC CATHETERIZATION  Result Date: 09/24/2021 1.  Patent vein graft to PDA and minimal obstructive disease of native left circulation.  The native right coronary artery was not imaged. 2.  Cardiac output of 8.8 L/min and cardiac index of 4.0 L/min/m with mean RA pressure of 5 mmHg and mean wedge pressure of 17 mmHg. 3.  Pulse pressure of near 100 mmHg with LVEDP of 24 to 28 mmHg across the respiratory cycle with significant bioprosthetic aortic valve regurgitation. Recommendation: Will admit for medical stabilization.  Given elevation in creatinine and relatively normal wedge and RA pressures additional diuresis will be held for now and the patient will be monitored.     Patient Profile     81 y.o. 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  Assessment & Plan    1 acute on chronic diastolic congestive heart failure-I/O-2550; wt 96.6 kg.  Volume status is improving.  Continue Lasix 40 mg IV twice daily and follow renal function.  2 severe prosthetic valve aortic stenosis-plan is for TAVR on Tuesday.  3 history of PVCs-continue amiodarone.  4 coronary artery disease status post coronary bypass and graft-continue aspirin and statin.  5 hypertension-continue present blood pressure medications and follow.  For questions or updates, please contact [place] Please consult [email or web address] for contact info under        Signed, Kirk Ruths, MD  09/26/2021, 8:28 AM

## 2021-09-26 NOTE — Plan of Care (Signed)
  Problem: Education: Goal: Understanding of CV disease, CV risk reduction, and recovery process will improve Outcome: Completed/Met   Problem: Activity: Goal: Ability to return to baseline activity level will improve Outcome: Completed/Met   Problem: Cardiovascular: Goal: Vascular access site(s) Level 0-1 will be maintained Outcome: Completed/Met

## 2021-09-27 ENCOUNTER — Encounter (HOSPITAL_COMMUNITY): Payer: Self-pay | Admitting: Internal Medicine

## 2021-09-27 LAB — BASIC METABOLIC PANEL
Anion gap: 9 (ref 5–15)
BUN: 23 mg/dL (ref 8–23)
CO2: 27 mmol/L (ref 22–32)
Calcium: 8.2 mg/dL — ABNORMAL LOW (ref 8.9–10.3)
Chloride: 108 mmol/L (ref 98–111)
Creatinine, Ser: 1.16 mg/dL (ref 0.61–1.24)
GFR, Estimated: >60 mL/min (ref >60)
Glucose, Bld: 97 mg/dL (ref 70–99)
Potassium: 3.7 mmol/L (ref 3.5–5.1)
Sodium: 144 mmol/L (ref 135–145)

## 2021-09-27 LAB — CBC WITH DIFFERENTIAL/PLATELET
Abs Immature Granulocytes: 0.03 10*3/uL (ref 0.00–0.07)
Basophils Absolute: 0.1 10*3/uL (ref 0.0–0.1)
Basophils Relative: 1 %
Eosinophils Absolute: 0.6 10*3/uL — ABNORMAL HIGH (ref 0.0–0.5)
Eosinophils Relative: 7 %
HCT: 31.7 % — ABNORMAL LOW (ref 39.0–52.0)
Hemoglobin: 10.2 g/dL — ABNORMAL LOW (ref 13.0–17.0)
Immature Granulocytes: 0 %
Lymphocytes Relative: 19 %
Lymphs Abs: 1.5 10*3/uL (ref 0.7–4.0)
MCH: 31.2 pg (ref 26.0–34.0)
MCHC: 32.2 g/dL (ref 30.0–36.0)
MCV: 96.9 fL (ref 80.0–100.0)
Monocytes Absolute: 0.6 10*3/uL (ref 0.1–1.0)
Monocytes Relative: 8 %
Neutro Abs: 5.4 10*3/uL (ref 1.7–7.7)
Neutrophils Relative %: 65 %
Platelets: 173 10*3/uL (ref 150–400)
RBC: 3.27 MIL/uL — ABNORMAL LOW (ref 4.22–5.81)
RDW: 14.6 % (ref 11.5–15.5)
WBC: 8.2 10*3/uL (ref 4.0–10.5)
nRBC: 0 % (ref 0.0–0.2)

## 2021-09-27 NOTE — Progress Notes (Signed)
Mobility Specialist Progress Note    09/27/21 1535  Mobility  Activity Ambulated independently in hallway  Level of Assistance Independent  Assistive Device None  Distance Ambulated (ft) 480 ft  Activity Response Tolerated well  $Mobility charge 1 Mobility   Pre-Mobility: 88 HR During Mobility: 93 HR Post-Mobility: 83 HR  Pt received in bed and agreeable. No complaints on walk. Returned to bed with call bell in reach.    Hildred Alamin Mobility Specialist

## 2021-09-27 NOTE — Progress Notes (Signed)
Progress Note  Patient Name: Luis Herrera Date of Encounter: 09/27/2021  Primary Cardiologist: [name], MD   Subjective   Dyspnea and leg swelling much improved with IV diuresis  Inpatient Medications    Scheduled Meds:  amiodarone  100 mg Oral Daily   amLODipine  5 mg Oral Daily   aspirin EC  81 mg Oral Daily   atorvastatin  80 mg Oral Daily   carvedilol  6.25 mg Oral BID WC   ezetimibe  10 mg Oral Daily   furosemide  40 mg Intravenous BID   heparin  5,000 Units Subcutaneous Q8H   levothyroxine  50 mcg Oral QAC breakfast   potassium chloride SA  20 mEq Oral Daily   sacubitril-valsartan  1 tablet Oral BID   sodium chloride flush  3 mL Intravenous Q12H   tamsulosin  0.4 mg Oral Daily   Continuous Infusions:  sodium chloride     PRN Meds: sodium chloride, acetaminophen, ondansetron (ZOFRAN) IV, sodium chloride flush   Vital Signs    Vitals:   09/26/21 2010 09/26/21 2125 09/27/21 0551 09/27/21 0825  BP: (!) 126/46 (!) 132/50 (!) 141/50 (!) 151/42  Pulse: 79  75 67  Resp: '16  16 18  '$ Temp: 97.6 F (36.4 C)  97.8 F (36.6 C) 98 F (36.7 C)  TempSrc: Oral  Oral Oral  SpO2: 94%  95% 96%  Weight:   95.6 kg   Height:        Intake/Output Summary (Last 24 hours) at 09/27/2021 0931 Last data filed at 09/27/2021 0825 Gross per 24 hour  Intake 600 ml  Output 2400 ml  Net -1800 ml   Filed Weights   09/24/21 1245 09/26/21 0557 09/27/21 0551  Weight: 97.5 kg 96.6 kg 95.6 kg    Telemetry    Nsr with PVC's - Personally Reviewed  ECG    none - Personally Reviewed  Physical Exam   GEN: No acute distress.   Neck: No JVD Cardiac: RRR, no murmurs, rubs, or gallops.  Respiratory: Clear to auscultation bilaterally. GI: Soft, nontender, non-distended  MS: No edema; No deformity. Neuro:  Nonfocal  Psych: Normal affect   Labs    Chemistry Recent Labs  Lab 09/25/21 0335 09/26/21 0308 09/27/21 0234  NA 145 142 144  K 3.5 3.7 3.7  CL 113* 113* 108  CO2  '27 25 27  '$ GLUCOSE 99 102* 97  BUN 25* 23 23  CREATININE 1.14 1.01 1.16  CALCIUM 8.3* 8.0* 8.2*  GFRNONAA >60 >60 >60  ANIONGAP 5 4* 9     Hematology Recent Labs  Lab 09/25/21 0335 09/26/21 0308 09/27/21 0234  WBC 8.0 8.8 8.2  RBC 3.18* 3.23* 3.27*  HGB 10.0* 10.0* 10.2*  HCT 30.9* 31.8* 31.7*  MCV 97.2 98.5 96.9  MCH 31.4 31.0 31.2  MCHC 32.4 31.4 32.2  RDW 14.6 14.6 14.6  PLT 180 176 173    Cardiac EnzymesNo results for input(s): "TROPONINI" in the last 168 hours. No results for input(s): "TROPIPOC" in the last 168 hours.   BNP Recent Labs  Lab 09/22/21 1255  PROBNP 12,282*     DDimer No results for input(s): "DDIMER" in the last 168 hours.   Radiology    No results found.  Cardiac Studies   Noted.   Patient Profile     81 y.o. male admitted with worsening heart failure in the setting of worsening aortic valve dysfunction with both AS and AI.  Assessment & Plan  Acute on chronic heart failure - he is much improved.  Worsening AS - he is pending TAVR in [facility] on Tuesday.  PVC's - he has been fairly well controlled in the past with low dose amiodarone.     For questions or updates, please contact [place] Please consult [email or web address] for contact info under Cardiology/STEMI.      Signed, Cristopher Peru, MD  09/27/2021, 9:31 AM

## 2021-09-28 LAB — CBC WITH DIFFERENTIAL/PLATELET
Abs Immature Granulocytes: 0.03 10*3/uL (ref 0.00–0.07)
Basophils Absolute: 0 10*3/uL (ref 0.0–0.1)
Basophils Relative: 0 %
Eosinophils Absolute: 0.6 10*3/uL — ABNORMAL HIGH (ref 0.0–0.5)
Eosinophils Relative: 7 %
HCT: 30.6 % — ABNORMAL LOW (ref 39.0–52.0)
Hemoglobin: 10.2 g/dL — ABNORMAL LOW (ref 13.0–17.0)
Immature Granulocytes: 0 %
Lymphocytes Relative: 21 %
Lymphs Abs: 1.7 10*3/uL (ref 0.7–4.0)
MCH: 31.8 pg (ref 26.0–34.0)
MCHC: 33.3 g/dL (ref 30.0–36.0)
MCV: 95.3 fL (ref 80.0–100.0)
Monocytes Absolute: 0.7 10*3/uL (ref 0.1–1.0)
Monocytes Relative: 8 %
Neutro Abs: 5.2 10*3/uL (ref 1.7–7.7)
Neutrophils Relative %: 64 %
Platelets: 171 10*3/uL (ref 150–400)
RBC: 3.21 MIL/uL — ABNORMAL LOW (ref 4.22–5.81)
RDW: 14.5 % (ref 11.5–15.5)
WBC: 8.3 10*3/uL (ref 4.0–10.5)
nRBC: 0 % (ref 0.0–0.2)

## 2021-09-28 LAB — BASIC METABOLIC PANEL
Anion gap: 6 (ref 5–15)
BUN: 24 mg/dL — ABNORMAL HIGH (ref 8–23)
CO2: 30 mmol/L (ref 22–32)
Calcium: 8.1 mg/dL — ABNORMAL LOW (ref 8.9–10.3)
Chloride: 107 mmol/L (ref 98–111)
Creatinine, Ser: 1.34 mg/dL — ABNORMAL HIGH (ref 0.61–1.24)
GFR, Estimated: 54 mL/min — ABNORMAL LOW (ref >60)
Glucose, Bld: 96 mg/dL (ref 70–99)
Potassium: 4.3 mmol/L (ref 3.5–5.1)
Sodium: 143 mmol/L (ref 135–145)

## 2021-09-28 LAB — SURGICAL PCR SCREEN
MRSA, PCR: NEGATIVE
Staphylococcus aureus: NEGATIVE

## 2021-09-28 NOTE — Progress Notes (Signed)
Progress Note  Patient Name: Luis Herrera Date of Encounter: 09/28/2021  Primary Cardiologist: [name], MD   Subjective   Denies chest pain or sob.   Inpatient Medications    Scheduled Meds:  amiodarone  100 mg Oral Daily   amLODipine  5 mg Oral Daily   aspirin EC  81 mg Oral Daily   atorvastatin  80 mg Oral Daily   carvedilol  6.25 mg Oral BID WC   ezetimibe  10 mg Oral Daily   furosemide  40 mg Intravenous BID   heparin  5,000 Units Subcutaneous Q8H   levothyroxine  50 mcg Oral QAC breakfast   potassium chloride SA  20 mEq Oral Daily   sacubitril-valsartan  1 tablet Oral BID   sodium chloride flush  3 mL Intravenous Q12H   tamsulosin  0.4 mg Oral Daily   Continuous Infusions:  sodium chloride     PRN Meds: sodium chloride, acetaminophen, ondansetron (ZOFRAN) IV, sodium chloride flush   Vital Signs    Vitals:   09/27/21 1623 09/27/21 2034 09/28/21 0229 09/28/21 0559  BP: (!) 119/40 (!) 140/45  (!) 128/46  Pulse:  75  78  Resp:  19  18  Temp:  97.7 F (36.5 C)  97.8 F (36.6 C)  TempSrc:  Oral  Oral  SpO2:  95%  95%  Weight:   95.7 kg   Height:        Intake/Output Summary (Last 24 hours) at 09/28/2021 1002 Last data filed at 09/28/2021 0400 Gross per 24 hour  Intake 480 ml  Output 1250 ml  Net -770 ml   Filed Weights   09/26/21 0557 09/27/21 0551 09/28/21 0229  Weight: 96.6 kg 95.6 kg 95.7 kg    Telemetry    Nsr with PVC's - Personally Reviewed  ECG    none - Personally Reviewed  Physical Exam   GEN: No acute distress.   Neck: No JVD Cardiac: IRRR, no murmurs, rubs, or gallops.  Respiratory: Clear to auscultation bilaterally. GI: Soft, nontender, non-distended  MS: No edema; No deformity. Neuro:  Nonfocal  Psych: Normal affect   Labs    Chemistry Recent Labs  Lab 09/26/21 0308 09/27/21 0234 09/28/21 0205  NA 142 144 143  K 3.7 3.7 4.3  CL 113* 108 107  CO2 '25 27 30  '$ GLUCOSE 102* 97 96  BUN 23 23 24*  CREATININE 1.01  1.16 1.34*  CALCIUM 8.0* 8.2* 8.1*  GFRNONAA >60 >60 54*  ANIONGAP 4* 9 6     Hematology Recent Labs  Lab 09/26/21 0308 09/27/21 0234 09/28/21 0205  WBC 8.8 8.2 8.3  RBC 3.23* 3.27* 3.21*  HGB 10.0* 10.2* 10.2*  HCT 31.8* 31.7* 30.6*  MCV 98.5 96.9 95.3  MCH 31.0 31.2 31.8  MCHC 31.4 32.2 33.3  RDW 14.6 14.6 14.5  PLT 176 173 171    Cardiac EnzymesNo results for input(s): "TROPONINI" in the last 168 hours. No results for input(s): "TROPIPOC" in the last 168 hours.   BNP Recent Labs  Lab 09/22/21 1255  PROBNP 12,282*     DDimer No results for input(s): "DDIMER" in the last 168 hours.   Radiology    No results found.  Cardiac Studies   See above  Patient Profile     81 y.o. male admitted with acute on chronic diastolic heart failure due to AS  Assessment & Plan    Acute on chronic diastolic heart failure - his creatinine bumped. We will hold  lasix today.  Worsening AS - he is pending TAVR on Tuesday. PVC's- these are fairly well controlled on low dose amiodarone.     For questions or updates, please contact [place] Please consult [email or web address] for contact info under Cardiology/STEMI.      Signed, Cristopher Peru, MD  09/28/2021, 10:02 AM

## 2021-09-29 ENCOUNTER — Inpatient Hospital Stay (HOSPITAL_COMMUNITY): Payer: Medicare Other

## 2021-09-29 DIAGNOSIS — N179 Acute kidney failure, unspecified: Secondary | ICD-10-CM

## 2021-09-29 DIAGNOSIS — I2510 Atherosclerotic heart disease of native coronary artery without angina pectoris: Secondary | ICD-10-CM

## 2021-09-29 DIAGNOSIS — I350 Nonrheumatic aortic (valve) stenosis: Secondary | ICD-10-CM

## 2021-09-29 LAB — CBC WITH DIFFERENTIAL/PLATELET
Abs Immature Granulocytes: 0.04 10*3/uL (ref 0.00–0.07)
Basophils Absolute: 0 10*3/uL (ref 0.0–0.1)
Basophils Relative: 1 %
Eosinophils Absolute: 0.6 10*3/uL — ABNORMAL HIGH (ref 0.0–0.5)
Eosinophils Relative: 8 %
HCT: 33 % — ABNORMAL LOW (ref 39.0–52.0)
Hemoglobin: 10.4 g/dL — ABNORMAL LOW (ref 13.0–17.0)
Immature Granulocytes: 1 %
Lymphocytes Relative: 19 %
Lymphs Abs: 1.5 10*3/uL (ref 0.7–4.0)
MCH: 30.8 pg (ref 26.0–34.0)
MCHC: 31.5 g/dL (ref 30.0–36.0)
MCV: 97.6 fL (ref 80.0–100.0)
Monocytes Absolute: 0.7 10*3/uL (ref 0.1–1.0)
Monocytes Relative: 9 %
Neutro Abs: 4.9 10*3/uL (ref 1.7–7.7)
Neutrophils Relative %: 62 %
Platelets: 160 10*3/uL (ref 150–400)
RBC: 3.38 MIL/uL — ABNORMAL LOW (ref 4.22–5.81)
RDW: 14.7 % (ref 11.5–15.5)
WBC: 7.7 10*3/uL (ref 4.0–10.5)
nRBC: 0 % (ref 0.0–0.2)

## 2021-09-29 LAB — BLOOD GAS, ARTERIAL
Acid-Base Excess: 7.8 mmol/L — ABNORMAL HIGH (ref 0.0–2.0)
Bicarbonate: 32 mmol/L — ABNORMAL HIGH (ref 20.0–28.0)
Drawn by: 31996
O2 Saturation: 98.3 %
Patient temperature: 36.5
pCO2 arterial: 41 mmHg (ref 32–48)
pH, Arterial: 7.5 — ABNORMAL HIGH (ref 7.35–7.45)
pO2, Arterial: 79 mmHg — ABNORMAL LOW (ref 83–108)

## 2021-09-29 LAB — BASIC METABOLIC PANEL
Anion gap: 15 (ref 5–15)
BUN: 24 mg/dL — ABNORMAL HIGH (ref 8–23)
CO2: 25 mmol/L (ref 22–32)
Calcium: 8.3 mg/dL — ABNORMAL LOW (ref 8.9–10.3)
Chloride: 107 mmol/L (ref 98–111)
Creatinine, Ser: 1.17 mg/dL (ref 0.61–1.24)
GFR, Estimated: >60 mL/min (ref >60)
Glucose, Bld: 97 mg/dL (ref 70–99)
Potassium: 3.9 mmol/L (ref 3.5–5.1)
Sodium: 147 mmol/L — ABNORMAL HIGH (ref 135–145)

## 2021-09-29 LAB — URINALYSIS, ROUTINE W REFLEX MICROSCOPIC
Bilirubin Urine: NEGATIVE
Glucose, UA: NEGATIVE mg/dL
Ketones, ur: NEGATIVE mg/dL
Nitrite: NEGATIVE
Protein, ur: NEGATIVE mg/dL
Specific Gravity, Urine: 1.008 (ref 1.005–1.030)
WBC, UA: >50 WBC/hpf — ABNORMAL HIGH (ref 0–5)
pH: 6 (ref 5.0–8.0)

## 2021-09-29 LAB — APTT: aPTT: 29 seconds (ref 24–36)

## 2021-09-29 LAB — PROTIME-INR
INR: 1.1 (ref 0.8–1.2)
Prothrombin Time: 14.4 seconds (ref 11.4–15.2)

## 2021-09-29 LAB — COMPREHENSIVE METABOLIC PANEL
ALT: 19 U/L (ref 0–44)
AST: 22 U/L (ref 15–41)
Albumin: 2.9 g/dL — ABNORMAL LOW (ref 3.5–5.0)
Alkaline Phosphatase: 88 U/L (ref 38–126)
Anion gap: 6 (ref 5–15)
BUN: 23 mg/dL (ref 8–23)
CO2: 28 mmol/L (ref 22–32)
Calcium: 8.6 mg/dL — ABNORMAL LOW (ref 8.9–10.3)
Chloride: 112 mmol/L — ABNORMAL HIGH (ref 98–111)
Creatinine, Ser: 1.23 mg/dL (ref 0.61–1.24)
GFR, Estimated: 59 mL/min — ABNORMAL LOW (ref >60)
Glucose, Bld: 105 mg/dL — ABNORMAL HIGH (ref 70–99)
Potassium: 3.8 mmol/L (ref 3.5–5.1)
Sodium: 146 mmol/L — ABNORMAL HIGH (ref 135–145)
Total Bilirubin: 0.7 mg/dL (ref 0.3–1.2)
Total Protein: 6.3 g/dL — ABNORMAL LOW (ref 6.5–8.1)

## 2021-09-29 LAB — SARS CORONAVIRUS 2 BY RT PCR: SARS Coronavirus 2 by RT PCR: NEGATIVE

## 2021-09-29 LAB — TYPE AND SCREEN
ABO/RH(D): A NEG
Antibody Screen: NEGATIVE

## 2021-09-29 LAB — HEMOGLOBIN A1C
Hgb A1c MFr Bld: 5.4 % (ref 4.8–5.6)
Mean Plasma Glucose: 108.28 mg/dL

## 2021-09-29 MED ORDER — TEMAZEPAM 15 MG PO CAPS: 15.0000 mg | ORAL_CAPSULE | Freq: Once | ORAL | Status: DC | PRN

## 2021-09-29 MED ORDER — FUROSEMIDE 40 MG PO TABS
60.0000 mg | ORAL_TABLET | Freq: Once | ORAL | Status: AC
  Administered 2021-09-29: 60 mg via ORAL
  Filled 2021-09-29: qty 1

## 2021-09-29 MED ORDER — CEFAZOLIN SODIUM-DEXTROSE 2-4 GM/100ML-% IV SOLN
2.0000 g | INTRAVENOUS | Status: AC
  Administered 2021-09-30: 2 g via INTRAVENOUS
  Filled 2021-09-29: qty 100

## 2021-09-29 MED ORDER — BISACODYL 5 MG PO TBEC
5.0000 mg | DELAYED_RELEASE_TABLET | Freq: Once | ORAL | Status: AC
  Administered 2021-09-29: 5 mg via ORAL
  Filled 2021-09-29: qty 1

## 2021-09-29 MED ORDER — POTASSIUM CHLORIDE 2 MEQ/ML IV SOLN
80.0000 meq | INTRAVENOUS | Status: DC
  Filled 2021-09-29: qty 40

## 2021-09-29 MED ORDER — NOREPINEPHRINE 4 MG/250ML-% IV SOLN
0.0000 ug/min | INTRAVENOUS | Status: AC
  Administered 2021-09-30: 2 ug/min via INTRAVENOUS
  Filled 2021-09-29: qty 250

## 2021-09-29 MED ORDER — HEPARIN 30,000 UNITS/1000 ML (OHS) CELLSAVER SOLUTION
Status: DC
  Filled 2021-09-29: qty 1000

## 2021-09-29 MED ORDER — DEXMEDETOMIDINE HCL IN NACL 400 MCG/100ML IV SOLN
0.1000 ug/kg/h | INTRAVENOUS | Status: AC
  Administered 2021-09-30: 95.72 ug via INTRAVENOUS
  Administered 2021-09-30: 1 ug/kg/h via INTRAVENOUS
  Filled 2021-09-29: qty 100

## 2021-09-29 MED ORDER — CHLORHEXIDINE GLUCONATE 0.12 % MT SOLN
15.0000 mL | Freq: Once | OROMUCOSAL | Status: AC
  Administered 2021-09-30: 15 mL via OROMUCOSAL
  Filled 2021-09-29: qty 15

## 2021-09-29 MED ORDER — CHLORHEXIDINE GLUCONATE 4 % EX LIQD
1.0000 | Freq: Once | CUTANEOUS | Status: AC
  Administered 2021-09-30: 1 via TOPICAL
  Filled 2021-09-29: qty 15

## 2021-09-29 MED ORDER — MAGNESIUM SULFATE 50 % IJ SOLN
40.0000 meq | INTRAMUSCULAR | Status: DC
  Filled 2021-09-29: qty 9.85

## 2021-09-29 NOTE — Progress Notes (Signed)
Progress Note  Patient Name: Luis Herrera Date of Encounter: 09/29/2021  [facility] Cardiologist: [name], MD   Subjective   Feels well. No orthopnea. Walked to nurses station yesterday, without dyspnea. Has bilateral calf edema, furosemide held yesterday due to worsening renal parameters. Labs pending today. Net diuresis 6 liters since admission. Weight down 4 lb since admission, stable for last 3 days at 211 lb.  Inpatient Medications    Scheduled Meds:  amiodarone  100 mg Oral Daily   amLODipine  5 mg Oral Daily   aspirin EC  81 mg Oral Daily   atorvastatin  80 mg Oral Daily   carvedilol  6.25 mg Oral BID WC   ezetimibe  10 mg Oral Daily   heparin  5,000 Units Subcutaneous Q8H   levothyroxine  50 mcg Oral QAC breakfast   potassium chloride SA  20 mEq Oral Daily   sacubitril-valsartan  1 tablet Oral BID   sodium chloride flush  3 mL Intravenous Q12H   tamsulosin  0.4 mg Oral Daily   Continuous Infusions:  sodium chloride     PRN Meds: sodium chloride, acetaminophen, ondansetron (ZOFRAN) IV, sodium chloride flush   Vital Signs    Vitals:   09/28/21 1351 09/28/21 2025 09/29/21 0444 09/29/21 0827  BP: (!) 113/47 (!) 130/49 (!) 119/56 (!) 131/40  Pulse:  (!) 57 67 79  Resp: '16  18 18  '$ Temp: 97.8 F (36.6 C) 98.7 F (37.1 C) 97.9 F (36.6 C) 97.8 F (36.6 C)  TempSrc: Oral Oral Oral Oral  SpO2:  100% 93% 94%  Weight:   95.7 kg   Height:        Intake/Output Summary (Last 24 hours) at 09/29/2021 0902 Last data filed at 09/29/2021 0800 Gross per 24 hour  Intake 120 ml  Output 900 ml  Net -780 ml      09/29/2021    4:44 AM 09/28/2021    2:29 AM 09/27/2021    5:51 AM  Last 3 Weights  Weight (lbs) 211 lb 211 lb 210 lb 12.8 oz  Weight (kg) 95.709 kg 95.709 kg 95.618 kg      Telemetry    NSR, PVCs, V couplets - Personally Reviewed  ECG    NSR, RBBB - Personally Reviewed  Physical Exam  Comfortable lying horizontally GEN: No acute distress.    Neck: 6 cm JVD, prominent V waves Cardiac: RRR, absent A2, deceptively faint AO ejection murmur, 1+ diastolic murmur, rubs, or gallops.  Respiratory: Clear to auscultation bilaterally. GI: Soft, nontender, non-distended  MS:1-2+ calf bilateral edema; No deformity. Neuro:  Nonfocal  Psych: Normal affect   Labs    High Sensitivity Troponin:  No results for input(s): "TROPONINIHS" in the last 720 hours.   Chemistry Recent Labs  Lab 09/26/21 0308 09/27/21 0234 09/28/21 0205  NA 142 144 143  K 3.7 3.7 4.3  CL 113* 108 107  CO2 '25 27 30  '$ GLUCOSE 102* 97 96  BUN 23 23 24*  CREATININE 1.01 1.16 1.34*  CALCIUM 8.0* 8.2* 8.1*  GFRNONAA >60 >60 54*  ANIONGAP 4* 9 6    Lipids No results for input(s): "CHOL", "TRIG", "HDL", "LABVLDL", "LDLCALC", "CHOLHDL" in the last 168 hours.  Hematology Recent Labs  Lab 09/27/21 0234 09/28/21 0205 09/29/21 0703  WBC 8.2 8.3 7.7  RBC 3.27* 3.21* 3.38*  HGB 10.2* 10.2* 10.4*  HCT 31.7* 30.6* 33.0*  MCV 96.9 95.3 97.6  MCH 31.2 31.8 30.8  MCHC 32.2 33.3  31.5  RDW 14.6 14.5 14.7  PLT 173 171 160   Thyroid No results for input(s): "TSH", "FREET4" in the last 168 hours.  BNP Recent Labs  Lab 09/22/21 1255  PROBNP 12,282*    DDimer No results for input(s): "DDIMER" in the last 168 hours.   Radiology    No results found.  Cardiac Studies   Cath 09/24/2021  1.  Patent vein graft to PDA and minimal obstructive disease of native left circulation.  The native right coronary artery was not imaged. 2.  Cardiac output of 8.8 L/min and cardiac index of 4.0 L/min/m with mean RA pressure of 5 mmHg and mean wedge pressure of 17 mmHg. 3.  Pulse pressure of near 100 mmHg with LVEDP of 24 to 28 mmHg across the respiratory cycle with significant bioprosthetic aortic valve regurgitation.   Recommendation: Will admit for medical stabilization.  Given elevation in creatinine and relatively normal wedge and RA pressures additional diuresis will be  held for now and the patient will be monitored.  Coronary CTA 09/19/2021  IMPRESSION: 1. 23 mm bioprosthetic aortic valve prosthesis (likely a CE Perimount). Severe prolapse of the leaflet of the valve in the [place] position.   2. Valve in valve analysis supports a 23 mm S3. Low-risk for coronary obstruction.   3. Optimal Fluoroscopic Angle for Delivery: LAO 4 CRA 16   4. Dilated pulmonary artery suggestive of pulmonary hypertension. Contrast reflux into the IVC consistent with elevated RA pressure.   5. Normal coronary origin. Left dominance. 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  ECHO 09/18/2021   1. Normal LV function; s/p AVR with elevated mean gradient of 38 mmHg and  peak velocity of 4.3 m/s suggestive of severe AS; AI appears mild but  pressure half time of 156 suggests possibly more significant; gradient  worse compared to 01/09/20; suggest  TEE to better assess prosthetic aortic valve.   2. Left ventricular ejection fraction, by estimation, is 60 to 65%. The  left ventricle has normal function. The left ventricle has no regional  wall motion abnormalities. The left ventricular internal cavity size was  mildly dilated. Left ventricular  diastolic parameters are consistent with Grade II diastolic dysfunction  (pseudonormalization).   3. Right ventricular systolic function is normal. The right ventricular  size is mildly enlarged.   4. Left atrial size was severely dilated.   5. The mitral valve is normal in structure. Mild mitral valve  regurgitation. No evidence of mitral stenosis.   6. The aortic valve has been repaired/replaced. Aortic valve  regurgitation is mild. Severe aortic valve stenosis. There is a unknown  bioprosthetic valve present in the aortic position. Procedure Date: 2009.   7. The inferior vena cava is normal in size with greater than 50%  respiratory variability, suggesting right atrial pressure of 3 mmHg.   Patient Profile     81 y.o. 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 Has evidence of hypervolemia (elevated R heart pressures), but is not dyspneic. TAVR in AM. This procedure has been fully reviewed with the patient and written informed consent has been obtained.      For questions or updates, please contact [place] Please consult [email or web address] for contact info under        Signed, Sanda Klein, MD  09/29/2021, 9:02 AM

## 2021-09-29 NOTE — Progress Notes (Signed)
Mobility Specialist Progress Note    09/29/21 1100  Mobility  Activity Ambulated independently in hallway  Level of Assistance Standby assist, set-up cues, supervision of patient - no hands on  Assistive Device None  Distance Ambulated (ft) 480 ft  Activity Response Tolerated well  $Mobility charge 1 Mobility   Pre-Mobility:72  HR Post-Mobility: 79 HR  Pt received in bed and agreeable. No complaints of SOB during. Returned to bed with call bell in reach.    Hildred Alamin Mobility Specialist

## 2021-09-29 NOTE — Plan of Care (Signed)
  Problem: Education: Goal: Understanding of cardiac disease, CV risk reduction, and recovery process will improve Outcome: Progressing   Problem: Clinical Measurements: Goal: Ability to maintain clinical measurements within normal limits will improve Outcome: Progressing   Problem: Education: Goal: Knowledge of General Education information will improve Description: Including pain rating scale, medication(s)/side effects and non-pharmacologic comfort measures Outcome: Completed/Met   Problem: Nutrition: Goal: Adequate nutrition will be maintained Outcome: Completed/Met   Problem: Coping: Goal: Level of anxiety will decrease Outcome: Completed/Met   Problem: Elimination: Goal: Will not experience complications related to bowel motility Outcome: Completed/Met Goal: Will not experience complications related to urinary retention Outcome: Completed/Met   Problem: Pain Managment: Goal: General experience of comfort will improve Outcome: Completed/Met   Problem: Skin Integrity: Goal: Risk for impaired skin integrity will decrease Outcome: Completed/Met

## 2021-09-30 ENCOUNTER — Inpatient Hospital Stay (HOSPITAL_COMMUNITY): Payer: Medicare Other | Admitting: Anesthesiology

## 2021-09-30 ENCOUNTER — Encounter (HOSPITAL_COMMUNITY): Payer: Self-pay | Admitting: Internal Medicine

## 2021-09-30 ENCOUNTER — Encounter (HOSPITAL_COMMUNITY)
Admission: RE | Disposition: A | Discharge status: Home / Self Care | Payer: Medicare Other | Source: Home / Self Care | Attending: Internal Medicine

## 2021-09-30 ENCOUNTER — Inpatient Hospital Stay (HOSPITAL_COMMUNITY): Payer: Medicare Other

## 2021-09-30 ENCOUNTER — Other Ambulatory Visit: Payer: Self-pay | Admitting: Physician Assistant

## 2021-09-30 DIAGNOSIS — I2510 Atherosclerotic heart disease of native coronary artery without angina pectoris: Secondary | ICD-10-CM

## 2021-09-30 DIAGNOSIS — D638 Anemia in other chronic diseases classified elsewhere: Secondary | ICD-10-CM

## 2021-09-30 DIAGNOSIS — T82857A Stenosis of cardiac prosthetic devices, implants and grafts, initial encounter: Secondary | ICD-10-CM | POA: Diagnosis not present

## 2021-09-30 DIAGNOSIS — Z87891 Personal history of nicotine dependence: Secondary | ICD-10-CM

## 2021-09-30 DIAGNOSIS — Z952 Presence of prosthetic heart valve: Secondary | ICD-10-CM

## 2021-09-30 DIAGNOSIS — Z006 Encounter for examination for normal comparison and control in clinical research program: Secondary | ICD-10-CM | POA: Diagnosis not present

## 2021-09-30 DIAGNOSIS — E039 Hypothyroidism, unspecified: Secondary | ICD-10-CM

## 2021-09-30 DIAGNOSIS — I10 Essential (primary) hypertension: Secondary | ICD-10-CM

## 2021-09-30 DIAGNOSIS — Z20822 Contact with and (suspected) exposure to covid-19: Secondary | ICD-10-CM | POA: Diagnosis not present

## 2021-09-30 DIAGNOSIS — I110 Hypertensive heart disease with heart failure: Secondary | ICD-10-CM | POA: Diagnosis not present

## 2021-09-30 DIAGNOSIS — I350 Nonrheumatic aortic (valve) stenosis: Secondary | ICD-10-CM

## 2021-09-30 HISTORY — PX: TRANSCATHETER AORTIC VALVE REPLACEMENT, TRANSFEMORAL: SHX6400

## 2021-09-30 HISTORY — DX: Presence of prosthetic heart valve: Z95.2

## 2021-09-30 HISTORY — PX: INTRAOPERATIVE TRANSTHORACIC ECHOCARDIOGRAM: SHX6523

## 2021-09-30 LAB — POCT I-STAT, CHEM 8
BUN: 19 mg/dL (ref 8–23)
BUN: 21 mg/dL (ref 8–23)
Calcium, Ion: 1.03 mmol/L — ABNORMAL LOW (ref 1.15–1.40)
Calcium, Ion: 1.21 mmol/L (ref 1.15–1.40)
Chloride: 109 mmol/L (ref 98–111)
Chloride: 106 mmol/L (ref 98–111)
Creatinine, Ser: 0.9 mg/dL (ref 0.61–1.24)
Creatinine, Ser: 1.1 mg/dL (ref 0.61–1.24)
Glucose, Bld: 118 mg/dL — ABNORMAL HIGH (ref 70–99)
Glucose, Bld: 128 mg/dL — ABNORMAL HIGH (ref 70–99)
HCT: 30 % — ABNORMAL LOW (ref 39.0–52.0)
HCT: 30 % — ABNORMAL LOW (ref 39.0–52.0)
Hemoglobin: 10.2 g/dL — ABNORMAL LOW (ref 13.0–17.0)
Hemoglobin: 10.2 g/dL — ABNORMAL LOW (ref 13.0–17.0)
Potassium: 3.4 mmol/L — ABNORMAL LOW (ref 3.5–5.1)
Potassium: 3.7 mmol/L (ref 3.5–5.1)
Sodium: 146 mmol/L — ABNORMAL HIGH (ref 135–145)
Sodium: 144 mmol/L (ref 135–145)
TCO2: 22 mmol/L (ref 22–32)
TCO2: 26 mmol/L (ref 22–32)

## 2021-09-30 LAB — BASIC METABOLIC PANEL WITH GFR
Anion gap: 7 (ref 5–15)
BUN: 24 mg/dL — ABNORMAL HIGH (ref 8–23)
CO2: 25 mmol/L (ref 22–32)
Calcium: 8.2 mg/dL — ABNORMAL LOW (ref 8.9–10.3)
Chloride: 111 mmol/L (ref 98–111)
Creatinine, Ser: 1.2 mg/dL (ref 0.61–1.24)
GFR, Estimated: >60 mL/min
Glucose, Bld: 95 mg/dL (ref 70–99)
Potassium: 4.1 mmol/L (ref 3.5–5.1)
Sodium: 143 mmol/L (ref 135–145)

## 2021-09-30 LAB — CBC WITH DIFFERENTIAL/PLATELET
Abs Immature Granulocytes: 0.02 K/uL (ref 0.00–0.07)
Basophils Absolute: 0.1 K/uL (ref 0.0–0.1)
Basophils Relative: 1 %
Eosinophils Absolute: 0.6 K/uL — ABNORMAL HIGH (ref 0.0–0.5)
Eosinophils Relative: 6 %
HCT: 32.4 % — ABNORMAL LOW (ref 39.0–52.0)
Hemoglobin: 10.3 g/dL — ABNORMAL LOW (ref 13.0–17.0)
Immature Granulocytes: 0 %
Lymphocytes Relative: 18 %
Lymphs Abs: 1.8 K/uL (ref 0.7–4.0)
MCH: 31.4 pg (ref 26.0–34.0)
MCHC: 31.8 g/dL (ref 30.0–36.0)
MCV: 98.8 fL (ref 80.0–100.0)
Monocytes Absolute: 0.9 K/uL (ref 0.1–1.0)
Monocytes Relative: 9 %
Neutro Abs: 6.6 K/uL (ref 1.7–7.7)
Neutrophils Relative %: 66 %
Platelets: 167 K/uL (ref 150–400)
RBC: 3.28 MIL/uL — ABNORMAL LOW (ref 4.22–5.81)
RDW: 14.7 % (ref 11.5–15.5)
WBC: 10 K/uL (ref 4.0–10.5)
nRBC: 0 % (ref 0.0–0.2)

## 2021-09-30 LAB — ECHOCARDIOGRAM LIMITED
AR max vel: 1.67 cm2
AV Area VTI: 1.55 cm2
AV Area mean vel: 1.77 cm2
AV Mean grad: 12 mmHg
AV Peak grad: 20.8 mmHg
Ao pk vel: 2.28 m/s
Height: 71 in
Weight: 3344 oz

## 2021-09-30 SURGERY — IMPLANTATION, AORTIC VALVE, TRANSCATHETER, FEMORAL APPROACH
Anesthesia: Monitor Anesthesia Care | Laterality: Right

## 2021-09-30 MED ORDER — HEPARIN (PORCINE) IN NACL 1000-0.9 UT/500ML-% IV SOLN
INTRAVENOUS | Status: DC | PRN
  Administered 2021-09-30 (×2): 500 mL

## 2021-09-30 MED ORDER — PROPOFOL 10 MG/ML IV BOLUS
INTRAVENOUS | Status: DC | PRN
  Administered 2021-09-30 (×2): 10 mg via INTRAVENOUS

## 2021-09-30 MED ORDER — SODIUM CHLORIDE 0.9% FLUSH: 3.0000 mL | INTRAVENOUS | Status: DC | PRN

## 2021-09-30 MED ORDER — HEPARIN SODIUM (PORCINE) 1000 UNIT/ML IJ SOLN
INTRAMUSCULAR | Status: DC | PRN
  Administered 2021-09-30: 15000 [IU] via INTRAVENOUS

## 2021-09-30 MED ORDER — SODIUM CHLORIDE 0.9 % IV SOLN: INTRAVENOUS | Status: AC

## 2021-09-30 MED ORDER — ASPIRIN 81 MG PO CHEW
81.0000 mg | CHEWABLE_TABLET | Freq: Every day | ORAL | Status: DC
  Administered 2021-10-01: 81 mg via ORAL
  Filled 2021-09-30: qty 1

## 2021-09-30 MED ORDER — LIDOCAINE HCL (PF) 1 % IJ SOLN
INTRAMUSCULAR | Status: DC | PRN
  Administered 2021-09-30: 10 mL

## 2021-09-30 MED ORDER — PROTAMINE SULFATE 10 MG/ML IV SOLN
INTRAVENOUS | Status: DC | PRN
  Administered 2021-09-30: 150 mg via INTRAVENOUS

## 2021-09-30 MED ORDER — MORPHINE SULFATE (PF) 2 MG/ML IV SOLN: 1.0000 mg | INTRAVENOUS | Status: DC | PRN

## 2021-09-30 MED ORDER — SODIUM CHLORIDE 0.9% FLUSH
3.0000 mL | Freq: Two times a day (BID) | INTRAVENOUS | Status: DC
  Administered 2021-09-30 – 2021-10-01 (×2): 3 mL via INTRAVENOUS

## 2021-09-30 MED ORDER — TRAMADOL HCL 50 MG PO TABS: 50.0000 mg | ORAL_TABLET | ORAL | Status: DC | PRN

## 2021-09-30 MED ORDER — ACETAMINOPHEN 325 MG PO TABS: 650.0000 mg | ORAL_TABLET | Freq: Four times a day (QID) | ORAL | Status: DC | PRN

## 2021-09-30 MED ORDER — PROPOFOL 1000 MG/100ML IV EMUL
INTRAVENOUS | Status: AC
  Filled 2021-09-30: qty 200

## 2021-09-30 MED ORDER — ACETAMINOPHEN 650 MG RE SUPP: 650.0000 mg | Freq: Four times a day (QID) | RECTAL | Status: DC | PRN

## 2021-09-30 MED ORDER — PHENYLEPHRINE 80 MCG/ML (10ML) SYRINGE FOR IV PUSH (FOR BLOOD PRESSURE SUPPORT)
PREFILLED_SYRINGE | INTRAVENOUS | Status: DC | PRN
  Administered 2021-09-30: 160 ug via INTRAVENOUS

## 2021-09-30 MED ORDER — ONDANSETRON HCL 4 MG/2ML IJ SOLN
4.0000 mg | Freq: Four times a day (QID) | INTRAMUSCULAR | Status: DC | PRN
  Filled 2021-09-30: qty 2

## 2021-09-30 MED ORDER — CEFAZOLIN SODIUM-DEXTROSE 2-4 GM/100ML-% IV SOLN
2.0000 g | Freq: Three times a day (TID) | INTRAVENOUS | Status: AC
  Administered 2021-09-30 – 2021-10-01 (×2): 2 g via INTRAVENOUS
  Filled 2021-09-30 (×2): qty 100

## 2021-09-30 MED ORDER — LACTATED RINGERS IV SOLN: INTRAVENOUS | Status: DC | PRN

## 2021-09-30 MED ORDER — LIDOCAINE HCL (PF) 1 % IJ SOLN
INTRAMUSCULAR | Status: AC
  Filled 2021-09-30: qty 30

## 2021-09-30 MED ORDER — PROPOFOL 500 MG/50ML IV EMUL
INTRAVENOUS | Status: DC | PRN
  Administered 2021-09-30: 20 ug/kg/min via INTRAVENOUS

## 2021-09-30 MED ORDER — IOHEXOL 350 MG/ML SOLN
INTRAVENOUS | Status: DC | PRN
  Administered 2021-09-30: 8 mL

## 2021-09-30 MED ORDER — SODIUM CHLORIDE 0.9 % IV SOLN: 250.0000 mL | INTRAVENOUS | Status: DC | PRN

## 2021-09-30 MED ORDER — NITROGLYCERIN IN D5W 200-5 MCG/ML-% IV SOLN
INTRAVENOUS | Status: AC
  Filled 2021-09-30: qty 250

## 2021-09-30 MED ORDER — PHENYLEPHRINE HCL-NACL 20-0.9 MG/250ML-% IV SOLN
INTRAVENOUS | Status: AC
  Filled 2021-09-30: qty 250

## 2021-09-30 MED ORDER — NITROGLYCERIN IN D5W 200-5 MCG/ML-% IV SOLN
0.0000 ug/min | INTRAVENOUS | Status: DC
  Administered 2021-09-30: 5 ug/min via INTRAVENOUS
  Filled 2021-09-30: qty 250

## 2021-09-30 MED ORDER — ONDANSETRON HCL 4 MG/2ML IJ SOLN
INTRAMUSCULAR | Status: DC | PRN
  Administered 2021-09-30: 4 mg via INTRAVENOUS

## 2021-09-30 MED ORDER — SODIUM CHLORIDE 0.9 % IV SOLN: 250.0000 mL | INTRAVENOUS | Status: DC

## 2021-09-30 MED ORDER — LIDOCAINE HCL 1 % IJ SOLN
INTRAMUSCULAR | Status: DC | PRN
  Administered 2021-09-30: 5 mL

## 2021-09-30 MED ORDER — OXYCODONE HCL 5 MG PO TABS: 5.0000 mg | ORAL_TABLET | ORAL | Status: DC | PRN

## 2021-09-30 SURGICAL SUPPLY — 27 items
BAG SNAP BAND KOVER 36X36 (MISCELLANEOUS) ×8 IMPLANT
CABLE ADAPT PACING TEMP 12FT (ADAPTER) ×1 IMPLANT
CATH 23 ULTRA DELIVERY (CATHETERS) ×1 IMPLANT
CATH DIAG 6FR PIGTAIL ANGLED (CATHETERS) ×2 IMPLANT
CATH INFINITI 6F AL1 (CATHETERS) ×1 IMPLANT
CLOSURE PERCLOSE PROSTYLE (VASCULAR PRODUCTS) ×4 IMPLANT
CRIMPER (MISCELLANEOUS) ×1 IMPLANT
DEVICE INFLATION ATRION QL2530 (MISCELLANEOUS) ×1 IMPLANT
KIT HEART LEFT (KITS) ×3 IMPLANT
KIT SAPIAN 3 ULTRA RESILIA 23 (Valve) ×1 IMPLANT
PACK CARDIAC CATHETERIZATION (CUSTOM PROCEDURE TRAY) ×3 IMPLANT
PAD ELECT DEFIB RADIOL ZOLL (MISCELLANEOUS) ×3 IMPLANT
SHEATH INTRODUCER SET 20-26 (SHEATH) ×1 IMPLANT
SHEATH PINNACLE 6F 10CM (SHEATH) ×2 IMPLANT
SHEATH PINNACLE 8F 10CM (SHEATH) ×1 IMPLANT
STOPCOCK MORSE 400PSI 3WAY (MISCELLANEOUS) ×6 IMPLANT
SYR MEDRAD MARK V 150ML (SYRINGE) ×1 IMPLANT
TRANSDUCER W/STOPCOCK (MISCELLANEOUS) ×6 IMPLANT
TUBING CONTRAST HIGH PRESS 48 (TUBING) ×1 IMPLANT
WIRE AMPLATZ SS-J .035X180CM (WIRE) ×1 IMPLANT
WIRE EMERALD 3MM-J .035X150CM (WIRE) ×1 IMPLANT
WIRE EMERALD 3MM-J .035X260CM (WIRE) ×1 IMPLANT
WIRE EMERALD ST .035X260CM (WIRE) ×1 IMPLANT
WIRE MICRO SET 5FR 12 (WIRE) ×2 IMPLANT
WIRE MICRO SET SILHO 5FR 7 (SHEATH) ×1 IMPLANT
WIRE PACING TEMP ST TIP 5 (CATHETERS) ×1 IMPLANT
WIRE SAFARI SM CURVE 275 (WIRE) ×1 IMPLANT

## 2021-09-30 NOTE — Discharge Instructions (Signed)

## 2021-09-30 NOTE — Op Note (Signed)
[facility]   MULTIDISCIPLINARY HEART VALVE TEAM   TAVR OPERATIVE NOTE   Date of Procedure:  09/30/2021  Preoperative Diagnosis: Severe Aortic Stenosis   Postoperative Diagnosis: Same   Procedure:   Transcatheter Aortic Valve in Valve Replacement - Percutaneous Right Transfemoral Approach  Edwards Sapien 3 Ultra Resilia THV (size 23 mm, model # 9755RSL, serial # [id number])   Co-Surgeons:  Gaye Pollack, MD and Lenna Sciara, MD   Anesthesiologist:  G. Kalman Shan, MD  Echocardiographer:  Viona Gilmore. O'Neal, MD  Pre-operative Echo Findings: Severe aortic stenosis Normal left ventricular systolic function  Post-operative Echo Findings: Mild  paravalvular leak Normal left ventricular systolic function   BRIEF CLINICAL NOTE AND INDICATIONS FOR SURGERY  This 81 year old gentleman has stage D, severe, symptomatic, prosthetic aortic valve stenosis with New York Heart Association class III-IV symptoms of exertional fatigue and shortness of breath with wheezing and cough as well as lower extremity edema and an elevated BNP of 2254 consistent with acute on chronic diastolic congestive heart failure.  I have personally reviewed his 2D echocardiogram, cardiac catheterization, and CTA studies.  His echocardiogram shows a bioprosthetic aortic valve with a mean gradient of 36.2 mmHg and a peak gradient of 69 mmHg.  There is mild aortic insufficiency.  Left ventricular ejection fraction is 60 to 65%.  Cardiac catheterization showed a patent vein graft to the PDA with minimal obstructive disease in the left coronary circulation.  LVEDP was 24-28 with a mean wedge pressure of 17 mmHg.  I agree that aortic valve replacement is indicated in this patient with significant prosthetic aortic valve dysfunction.  Given his advanced age I think valve in valve transcatheter aortic valve replacement would be the best treatment for him.  His gated cardiac CTA shows anatomy suitable for this using a 23 mm  SAPIEN 3 valve.  His abdominal and pelvic CTA shows adequate pelvic vascular anatomy to allow transfemoral insertion.   The patient and his family were counseled at length regarding treatment alternatives for management of severe symptomatic aortic stenosis. The risks and benefits of surgical intervention has been discussed in detail. Long-term prognosis with medical therapy was discussed. Alternative approaches such as conventional surgical aortic valve replacement, transcatheter aortic valve replacement, and palliative medical therapy were compared and contrasted at length. This discussion was placed in the context of the patient's own specific clinical presentation and past medical history. All of their questions have been addressed.    Following the decision to proceed with transcatheter aortic valve replacement, a discussion was held regarding what types of management strategies would be attempted intraoperatively in the event of life-threatening complications, including whether or not the patient would be considered a candidate for the use of cardiopulmonary bypass and/or conversion to open sternotomy for attempted surgical intervention.  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  The patient is aware of the fact that transient use of cardiopulmonary bypass may be necessary. The patient has been advised of a variety of complications that might develop including but not limited to risks of death, stroke, paravalvular leak, aortic dissection or other major vascular complications, aortic annulus rupture, device embolization, cardiac rupture or perforation, mitral regurgitation, acute myocardial infarction, arrhythmia, heart block or bradycardia requiring permanent pacemaker placement, congestive heart failure,  respiratory failure, renal failure, pneumonia, infection, other late complications  related to structural valve deterioration or migration, or other complications that might ultimately cause a temporary or permanent loss of functional independence or other long term morbidity. The patient provides full informed consent for the procedure as described and all questions were answered.     DETAILS OF THE OPERATIVE PROCEDURE  PREPARATION:    The patient was brought to the operating room on the above mentioned date and appropriate monitoring was established by the anesthesia team. The patient was placed in the supine position on the operating table.  Intravenous antibiotics were administered. The patient was monitored closely throughout the procedure under conscious sedation.  Baseline transthoracic echocardiogram was performed. The patient's abdomen and both groins were prepped and draped in a sterile manner. A time out procedure was performed.   PERIPHERAL ACCESS:    Using the modified Seldinger technique, right IJ venous access was obtained with placement of a 7 Fr sheath.  A temporary transvenous pacemaker catheter was passed through the venous sheath under fluoroscopic guidance into the right ventricle.  The pacemaker was tested to ensure stable lead placement and pacemaker capture.  TRANSFEMORAL ACCESS:   Percutaneous transfemoral access and sheath placement was performed using ultrasound guidance.  The right common femoral artery was cannulated using a micropuncture needle and appropriate location was verified using hand injection angiogram.  A pair of Abbott Perclose percutaneous closure devices were placed and a 6 French sheath replaced into the femoral artery.  The patient was heparinized systemically and ACT verified > 250 seconds.    A 14 Fr transfemoral E-sheath was introduced into the right common femoral artery after progressively dilating over an Amplatz superstiff wire. An AL-1  catheter was used to direct a straight-tip exchange length wire across the native aortic valve into the left ventricle. This was exchanged out for a pigtail catheter and position was confirmed in the LV apex. Simultaneous LV and Ao pressures were recorded.  The pigtail catheter was exchanged for a Safari wire in the LV apex.   BALLOON AORTIC VALVULOPLASTY:   Not performed   TRANSCATHETER HEART VALVE DEPLOYMENT:   An Edwards Sapien 3 Ultra transcatheter heart valve (size 23 mm) was prepared and crimped per manufacturer's guidelines, and the proper orientation of the valve is confirmed on the [employer] delivery system. The valve was advanced through the introducer sheath using normal technique until in an appropriate position in the abdominal aorta beyond the sheath tip. The balloon was then retracted and using the fine-tuning wheel was centered on the valve. The valve was then advanced across the aortic arch using appropriate flexion of the catheter. The valve was carefully positioned across the old prosthetic valve. The Commander catheter was retracted using normal technique. Once final position of the valve has been confirmed the valve is deployed during rapid ventricular pacing to maintain systolic blood pressure < 50 mmHg and pulse pressure < 10 mmHg. The balloon inflation is held for >3 seconds after reaching full deployment volume. Once the balloon has fully deflated the balloon is retracted into the ascending aorta and valve function is assessed using echocardiography. There is felt to be mild paravalvular leak and no central aortic insufficiency. It was suspected that this leak was around the old prosthetic valve and annulus. The patient's hemodynamic recovery following valve deployment is good.  The deployment balloon and guidewire are both removed.    PROCEDURE COMPLETION:   The sheath was removed and femoral artery closure performed.  Protamine was administered once femoral arterial  repair  was complete. The temporary pacemaker and IJ sheath were removed with manual pressure used for venous hemostasis.   The patient tolerated the procedure well and is transported to the cath lab recovery area in stable condition. There were no immediate intraoperative complications. All sponge instrument and needle counts are verified correct at completion of the operation.   No blood products were administered during the operation.  The patient received a total of 8 mL of intravenous contrast during the procedure.   Gaye Pollack, MD 09/30/2021

## 2021-09-30 NOTE — Interval H&P Note (Signed)
History and Physical Interval Note:  09/30/2021 6:34 AM  Luis Herrera  has presented today for surgery, with the diagnosis of Severe Bioprosthetic Aortic Stenosis and Aortic Insufficiency.  The various methods of treatment have been discussed with the patient and family. After consideration of risks, benefits and other options for treatment, the patient has consented to  Procedure(s): Transcatheter Aortic Valve Replacement, Transfemoral (Right) INTRAOPERATIVE TRANSTHORACIC ECHOCARDIOGRAM (N/A) as a surgical intervention.  The patient's history has been reviewed, patient examined, no change in status, stable for surgery.  I have reviewed the patient's chart and labs.  Questions were answered to the patient's satisfaction.     Gaye Pollack

## 2021-09-30 NOTE — Op Note (Signed)
[facility]  TAVR OPERATIVE NOTE   Date of Procedure:  09/30/2021  Preoperative Diagnosis: Severe Aortic Stenosis   Postoperative Diagnosis: Same   Procedure:   Transcatheter Aortic Valve Replacement (ViV)- Transfemoral Approach  Edwards Sapien 3 Resilia THV (size 23 mm, model # 9755RLS, serial # [id number])   Co-Surgeons:   Gaye Pollack, MD and Lenna Sciara, MD Anesthesiologist:  Myrtie Soman, MD  Echocardiographer:  Eleonore Chiquito, MD  Pre-operative Echo Findings: Severe bioprosthetic aortic valve degeneration Normal left ventricular systolic function  Post-operative Echo Findings: Mild paravalvular leak Normal left ventricular systolic function  Left Heart Catheterization Findings: Left ventricular end-diastolic pressure of 25 mmHg   BRIEF CLINICAL NOTE AND INDICATIONS FOR SURGERY  The patient is an 81 year old male with history of severe aortic stenosis status post aortic valve replacement with a 23 mm Paramount valve with concomitant vein graft to PDA in 2009, hypertension, hyperlipidemia, chronic diastolic dysfunction, and 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  He was evaluated in multidisciplinary fashion and thought to be best served by valve in valve transcatheter aortic valve replacement.  During the course of the patient's preoperative work up they have been evaluated comprehensively by a multidisciplinary team of specialists coordinated through the [facility] in the [facility].  They have been demonstrated to suffer from symptomatic severe aortic stenosis as noted above. The patient has been counseled extensively as to the relative risks and benefits of all options for the treatment of severe aortic stenosis including long term medical therapy, conventional surgery for aortic valve replacement, and transcatheter aortic valve replacement.  The patient  has been independently evaluated by Dr. Cyndia Bent with CT surgery and they are felt to be at high risk for conventional surgical aortic valve replacement. The surgeon indicated the patient would be a poor candidate for conventional surgery. Based upon review of all of the patient's preoperative diagnostic tests they are felt to be candidate for transcatheter aortic valve replacement using the transfemoral approach as an alternative to high risk conventional surgery.    Following the decision to proceed with transcatheter aortic valve replacement, a discussion has been held regarding what types of management strategies would be attempted intraoperatively in the event of life-threatening complications, including whether or not the patient would be considered a candidate for the use of cardiopulmonary bypass and/or conversion to open sternotomy for attempted surgical intervention.  The patient has been advised of a variety of complications that might develop peculiar to this approach including but not limited to risks of death, stroke, paravalvular leak, aortic dissection or other major vascular complications, aortic annulus rupture, device embolization, cardiac rupture or perforation, acute myocardial infarction, arrhythmia, heart block or bradycardia requiring permanent pacemaker placement, congestive heart failure, respiratory failure, renal failure, pneumonia, infection, other late complications related to structural valve deterioration or migration, or other complications that might ultimately cause a temporary or permanent loss of functional independence or other long term morbidity.  The patient provides full informed consent for the procedure as described and all questions were answered preoperatively.    DETAILS OF THE OPERATIVE PROCEDURE  PREPARATION:   The patient is brought to the operating room on the above mentioned date and central monitoring was established by the anesthesia team including  placement of a radial arterial line. The patient is placed in the supine position on the operating table.  Intravenous antibiotics are administered. Conscious sedation is used.  Baseline transthoracic echocardiogram was performed. The patient's chest, abdomen, both groins, and both lower extremities are prepared and draped in a sterile manner. A time out procedure is performed.   PERIPHERAL ACCESS:   Initially given the patient's history of a right bundle branch block ultrasound axis was obtained in the right internal jugular vein and a temporary pacemaker was placed.  Its thresholds were tested and found to be adequate.    TRANSFEMORAL ACCESS:  A micropuncture kit was used to gain access to the right femoral artery using u/s guidance. Position confirmed with angiography. Pre-closure with double ProGlide closure devices. The patient was heparinized systemically and ACT verified > 250 seconds.    A 14 Fr transfemoral E-sheath was introduced into the right femoral artery after progressively dilating over an Amplatz superstiff wire. An AL-1 catheter was used to direct a straight-tip exchange length wire across the native aortic valve into the left ventricle. This was exchanged out for a pigtail catheter and position was confirmed in the LV apex. Left ventricular end-diastolic pressures was recorded.  The pigtail catheter was then exchanged for an Safari wire in the LV apex.    TRANSCATHETER HEART VALVE DEPLOYMENT:  An Edwards Sapien 3 THV (size 23 mm) was prepared and crimped per manufacturer's guidelines, and the proper orientation of the valve is confirmed on the [employer] delivery system. The valve was advanced through the introducer sheath using normal technique until in an appropriate position in the abdominal aorta beyond the sheath tip. The balloon was then retracted and using the fine-tuning wheel was centered on the valve. The valve was then advanced across the aortic arch using  appropriate flexion of the catheter. The valve was carefully positioned across the aortic valve annulus. The Commander catheter was retracted using normal technique.  The temporary pacemaker would not capture and therefore direct LV pacing was pursued.  The balloon inflation is held for >3 seconds after reaching full deployment volume. Once the balloon has fully deflated the balloon is retracted into the ascending aorta and valve function is assessed using TTE. There is felt to be mild paravalvular leak and no central aortic insufficiency.  The patient's hemodynamic recovery following valve deployment is good.  The deployment balloon and guidewire are both removed. Echo demostrated acceptable post-procedural gradients, stable mitral valve function, and no central AI.   PROCEDURE COMPLETION:  The sheath was then removed and closure devices were completed. Protamine was administered once femoral arterial repair was complete. The temporary pacemaker was removed and the RIJ sheath will be removed in the holding area.  The patient tolerated the procedure well and is transported to the surgical intensive care in stable condition. There were no immediate intraoperative complications. All sponge instrument and needle counts are verified correct at completion of the operation.   No blood products were administered during the operation.  The patient received a total of 8 mL of intravenous contrast during the procedure.  Early Osmond MD 09/30/2021 9:57 AM

## 2021-09-30 NOTE — Anesthesia Procedure Notes (Signed)
Procedure Name: MAC Date/Time: 09/30/2021 7:35 AM  Performed by: Lorie Phenix, CRNAPre-anesthesia Checklist: Patient identified, Emergency Drugs available, Suction available and Patient being monitored Patient Re-evaluated:Patient Re-evaluated prior to induction Oxygen Delivery Method: Simple face mask Placement Confirmation: positive ETCO2

## 2021-09-30 NOTE — Progress Notes (Signed)
Called cardiology for ST in 100's, Fever 101.1, 161/70 and nausea. Pt given zofran for nausea and Incentive. Did not give any PO meds due to nausea. Started on IV nitro for BP. Started and titrated to 10 mcg. PM RN will continue to monitor. Payton Emerald, RN

## 2021-09-30 NOTE — Discharge Summary (Incomplete)
[facility] TEAM  Discharge Summary    Patient ID: Jep Dyas MRN: [id number]; DOB: [birth year]  Admit date: 09/24/2021 Discharge date: 10/01/2021  Primary Care Provider: [name], MD  Primary Cardiologist: [name], MD / Dr. Ali Lowe and Dr. Cyndia Bent, MD  Discharge Diagnoses    Principal Problem:   Acute diastolic heart failure [facility]) Active Problems:   Hyperlipidemia with target LDL less than 130   Essential hypertension   Aortic valve replaced   Class 1 obesity with body mass index (BMI) of 31.0 to 31.9 in adult   Frequent PVCs   Right bundle branch block   CAD (coronary artery disease)   Hypothyroidism   Severe aortic stenosis   S/P CABG (coronary artery bypass graft)   S/P TAVR (transcatheter aortic valve replacement)  Allergies Allergies  Allergen Reactions   Sulfa Antibiotics Other (See Comments)    Carlyn Reichert Syndrome   Bactrim [Sulfamethoxazole-Trimethoprim] Other (See Comments)    Carlyn Reichert Syndrome   Diagnostic Studies/Procedures    TAVR OPERATIVE NOTE     Date of Procedure:                09/30/2021   Preoperative Diagnosis:      Severe Aortic Stenosis    Postoperative Diagnosis:    Same    Procedure:        Transcatheter Aortic Valve Replacement (ViV)- Transfemoral Approach             Edwards Sapien 3 Resilia THV (size 23 mm, model # 9755RLS, serial # [id number])              Co-Surgeons:                         Gaye Pollack, MD and Lenna Sciara, MD Anesthesiologist:                  Myrtie Soman, MD   Echocardiographer:              Eleonore Chiquito, MD   Pre-operative Echo Findings: Severe bioprosthetic aortic valve degeneration Normal left ventricular systolic function   Post-operative Echo Findings: Mild paravalvular leak Normal left ventricular systolic function   Left Heart Catheterization Findings: Left ventricular end-diastolic pressure of 25 mmHg  _____________    Echo 10/01/21: completed but pending formal read at the time of discharge   History of Present Illness     Dmonte Maher is a 81 y.o. male with a history of  aortic valve disease s/p AVR in 2009 ([facility]), CAD s/p CABG with SVG to PDA 2009), HTN, PVCs on Amiodarone (followed by EP) and chronic diastolic dysfunction who was admitted 09/22/21 for volume overload prior to TAVR scheduled for 8/15.    Hospital Course    Mr. Jantz is followed by Dr. Harrington Challenger for his cardiology care. He underwent CABGx1 with AVR in 2009 while still living in [place]. His symptoms were similar at that time with SOB and fatigue prompting a cardiac catheterization and subsequent surgery. After moving to [place], he was followed at [facility] however transitioned to [facility] in 2015. His AVR has been followed with surveillance echocardiograms with stable gradients noted, mostly ranging in the 18-22 mmHg range. Echocardiogram from 12/2019 showed an LVEF at 55-60% with an unknown size bioprosthetic valve present in the aortic position with a mean gradient measuring 18.0 mmHg.    More recently he has been  having issues with worsening SOB and fluid volume overload. He has been followed by Dr. Harrington Challenger on several occasions for Lasix adjustments. A repeat echocardiogram was obtained which showed normal LVEF and increased AV gradients with a mean at 38 mmHg and peak at 69.60mHg, and AVA by VTI at 0.87. Given this, Dr. RHarrington Challengerfelt he needed to be admitted for CHF with TAVR workup. On admission, he was treated with IV Lasix with resolution of his LE edema. He was seen by the Structural Heart team and underwent Pre TAVR CT imaging. Given his response to Lasix, plan was to follow with Dr. TAli LoweOP 10/03/21 to further discuss pre TAVR cardiac catheterization and surgical evaluation. His home Lasix was increased to '60mg'$  daily.    Unfortunately he called our office with ongoing complaints of weight gain, orthopnea, and exertional SOB  after discharge and was seen as and add-on to my schedule. His Lasix was again adjusted and he was set up for OP cardiac cath with Dr. TAli Loweand hospital admission thereafter for diuresis. He has since been treated with IV Lasix and transitioned to PO doing.   He underwent successful TAVR with a 23 mm Edwards Sapien 3 UR via the TF  approach on 09/30/21. Post operative echo pending. Groin site has remained stable. ECG with NSR and no high grade heart block. His ASA was restarted. Will plan to discharge with PO Lasix '40mg'$  daily and reassess fluid volume at follow up. Creatinine today at 1.21.   Severe AS: s/p successful TAVR with a 23 mm Edwards Sapien 3 UR via the TF  approach on 09/30/21. Post operative echo pending. Groin site is stable. ECG with NSR and no high grade heart block. Continue ASA. [facility] care reviewed with patient with understanding. Ambulated with CRI with no issues.   Acute diastolic CHF: Patient has been followed by Dr. RHarrington Challengerfor CHF symptoms with diuretic adjustments. Echocardiogram performed 09/18/21 showed normal LV function s/p AVR with elevated mean gradient of 38 mmHg and peak velocity of 4.3 m/s suggestive of severe AS. He was admitted to the hospital and treated with IV Lasix x2 dosing with improvement. Pre TAVR CT imaging performed and is under review at this time. Plan was for OP follow up with Dr. TAli Lowefor cath and surgical OP appointment however was scheduled for OP cath and hospital admission thereafter given recurrent CHF exacerbation. He has diuresed well with OV Lasix . Plan to discharge with PO Lasix '40mg'$  QD and reassess response at follow up next week. Cr a little above baseline so would recheck this as well. Weight today 212lb, I&O, net negative 5.1L. Hold Entresto at discharge and hope to restart at follow up if Cr and BP can tolerate.    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 Continue ASA '81mg'$  QD,  Lipitor '80mg'$  QD, carvedilol 6.'25mg'$ .   HTN: Stable with no changes needed today.    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 TLovena Le   Consultants: None    The patient was seen and examined by Dr. TAli Lowewho feels that he is stable and ready for discharge today, 09/30/21.  _____________  Discharge Vitals Blood pressure (!) 146/60, pulse 78, temperature 98.1 F (36.7 C), temperature source Oral, resp. rate 20, height '5\' 11"'$  (1.803 m), weight 96.2 kg, SpO2 94 %.  Filed Weights   09/30/21 0439 09/30/21 0600 10/01/21 0606  Weight: 96.8 kg 94.8 kg  96.2 kg   General: Well developed, well nourished, NAD Neck: Negative for carotid bruits. No JVD Lungs:Clear to ausculation bilaterally. Breathing is unlabored. Cardiovascular: RRR with S1 S2. No murmurs Extremities: Trace BLE edema.  Neuro: Alert and oriented. No focal deficits. No facial asymmetry. MAE spontaneously. Psych: Responds to questions appropriately with normal affect.    Labs & Radiologic Studies    CBC Recent Labs    09/29/21 0703 09/30/21 0318 09/30/21 0756 09/30/21 1018 10/01/21 0333  WBC 7.7 10.0  --   --  10.1  NEUTROABS 4.9 6.6  --   --   --   HGB 10.4* 10.3*   < > 10.2* 10.2*  HCT 33.0* 32.4*   < > 30.0* 32.8*  MCV 97.6 98.8  --   --  100.6*  PLT 160 167  --   --  134*   < > = values in this interval not displayed.   Basic Metabolic Panel Recent Labs    09/30/21 0318 09/30/21 0756 09/30/21 1018 10/01/21 0333  NA 143   < > 144 141  K 4.1   < > 3.7 4.4  CL 111   < > 106 111  CO2 25  --   --  25  GLUCOSE 95   < > 128* 106*  BUN 24*   < > 21 17  CREATININE 1.20   < > 1.10 1.21  CALCIUM 8.2*  --   --  7.9*  MG  --   --   --  2.1   < > = values in this interval not displayed.   Liver Function Tests Recent Labs    09/29/21 1246  AST 22  ALT 19  ALKPHOS 88  BILITOT 0.7  PROT 6.3*  ALBUMIN 2.9*   No results for input(s): "LIPASE", "AMYLASE" in the last 72 hours. Cardiac Enzymes No  results for input(s): "CKTOTAL", "CKMB", "CKMBINDEX", "TROPONINI" in the last 72 hours. BNP Invalid input(s): "POCBNP" D-Dimer No results for input(s): "DDIMER" in the last 72 hours. Hemoglobin A1C Recent Labs    09/29/21 1246  HGBA1C 5.4   Fasting Lipid Panel No results for input(s): "CHOL", "HDL", "LDLCALC", "TRIG", "CHOLHDL", "LDLDIRECT" in the last 72 hours. Thyroid Function Tests No results for input(s): "TSH", "T4TOTAL", "T3FREE", "THYROIDAB" in the last 72 hours.  Invalid input(s): "FREET3" _____________  ECHOCARDIOGRAM LIMITED  Result Date: 09/30/2021    ECHOCARDIOGRAM LIMITED REPORT   Patient Name:   CHANSE KAGEL Date of Exam: 09/30/2021 Medical Rec #:  [id number]    Height:       71.0 in Accession #:    [id number]   Weight:       209.0 lb Date of Birth:  12-21-1940     BSA:          2.148 m Patient Age:    34 years     BP:           141/47 mmHg Patient Gender: M            HR:           68 bpm. Exam Location:  Inpatient Procedure: Limited Echo, Cardiac Doppler and Color Doppler Indications:    Aortic stenosis I35.0  History:        Patient has prior history of Echocardiogram examinations, most                 recent 09/18/2021. CHF, CAD, Prior CABG, PAD, Aortic Valve  Disease; Risk Factors:Hypertension and Dyslipidemia.                 Aortic Valve: 63m Edwards Sapien Valve Procedure Date:                 09/30/2021                 #23 pericardial valve Placed on 09/22/2007 valve is present in                 the aortic position. .  Sonographer:    TDarlina SicilianRDCS Referring Phys: 2[place] 1. Echo guided TAVR. 23 mm S3 ViV TAVR. Mild paravalvular leak post procedure. Vmax 2.3 m/s, MG 12 mmHG, EOA 1.55 cm2, DI 0.38. The aortic valve has been repaired/replaced. Aortic valve regurgitation is mild. There is a 29mEdwards Sapien Valve #23 pericardial valve 09/22/2007 valve present in the aortic position. Procedure Date: 09/30/2021.  2. Left  ventricular ejection fraction, by estimation, is 50 to 55%. The left ventricle has low normal function. Left ventricular endocardial border not optimally defined to evaluate regional wall motion.  3. Right ventricular systolic function is normal. The right ventricular size is normal.  4. The mitral valve is degenerative. Mild mitral valve regurgitation. FINDINGS  Left Ventricle: Left ventricular ejection fraction, by estimation, is 50 to 55%. The left ventricle has low normal function. Left ventricular endocardial border not optimally defined to evaluate regional wall motion. Right Ventricle: The right ventricular size is normal. No increase in right ventricular wall thickness. Right ventricular systolic function is normal. Pericardium: There is no evidence of pericardial effusion. Mitral Valve: The mitral valve is degenerative in appearance. Mild to moderate mitral annular calcification. Mild mitral valve regurgitation. Aortic Valve: Echo guided TAVR. 23 mm S3 ViV TAVR. Mild paravalvular leak post procedure. Vmax 2.3 m/s, MG 12 mmHG, EOA 1.55 cm2, DI 0.38. The aortic valve has been repaired/replaced. Aortic valve regurgitation is mild. Aortic valve mean gradient measures 12.0 mmHg. Aortic valve peak gradient measures 20.8 mmHg. Aortic valve area, by VTI measures 1.55 cm. There is a 2312mdwards Sapien Valve #23 pericardial valve 09/22/2007 valve present in the aortic position. Procedure Date: 09/30/2021. LEFT VENTRICLE PLAX 2D LVOT diam:     2.27 cm LV SV:         83 LV SV Index:   38 LVOT Area:     4.05 cm  AORTIC VALVE AV Area (Vmax):    1.67 cm AV Area (Vmean):   1.77 cm AV Area (VTI):     1.55 cm AV Vmax:           228.00 cm/s AV Vmean:          155.000 cm/s AV VTI:            0.532 m AV Peak Grad:      20.8 mmHg AV Mean Grad:      12.0 mmHg LVOT Vmax:         94.10 cm/s LVOT Vmean:        67.900 cm/s LVOT VTI:          0.204 m LVOT/AV VTI ratio: 0.38  SHUNTS Systemic VTI:  0.20 m Systemic Diam: 2.27 cm  WesEleonore Chiquito Electronically signed by WesEleonore Chiquito Signature Date/Time: 09/30/2021/11:16:02 AM    Final    Structural Heart Procedure  Result Date: 09/30/2021 See surgical note for result.  DG Chest 2 View  Result Date: 09/29/2021  CLINICAL DATA:  Preoperative assessment for valve replacement surgery EXAM: CHEST - 2 VIEW COMPARISON:  09/19/2019, 09/19/2021 FINDINGS: Frontal and lateral views of the chest demonstrate a stable cardiac silhouette. Postsurgical changes from median sternotomy and aortic valve replacement. Trace bilateral pleural effusions. No airspace disease or pneumothorax. Minimal left basilar atelectasis. No acute bony abnormalities. IMPRESSION: 1. Trace bilateral pleural effusions. 2. Stable left basilar atelectasis. Electronically Signed   By: Randa Ngo M.D.   On: 09/29/2021 11:58   CARDIAC CATHETERIZATION  Result Date: 09/24/2021 1.  Patent vein graft to PDA and minimal obstructive disease of native left circulation.  The native right coronary artery was not imaged. 2.  Cardiac output of 8.8 L/min and cardiac index of 4.0 L/min/m with mean RA pressure of 5 mmHg and mean wedge pressure of 17 mmHg. 3.  Pulse pressure of near 100 mmHg with LVEDP of 24 to 28 mmHg across the respiratory cycle with significant bioprosthetic aortic valve regurgitation. Recommendation: Will admit for medical stabilization.  Given elevation in creatinine and relatively normal wedge and RA pressures additional diuresis will be held for now and the patient will be monitored.   CT CORONARY MORPH W/CTA COR W/SCORE W/CA W/CM &/OR WO/CM  Addendum Date: 09/22/2021   ADDENDUM REPORT: 09/22/2021 19:06 CLINICAL DATA:  S/p aortic valve replacement and CABG. EXAM: Cardiac TAVR CT TECHNIQUE: A non-contrast, gated CT scan was obtained with axial slices of 3 mm through the heart for aortic valve calcium scoring. A 90 kV retrospective, gated, contrast cardiac scan was obtained. Gantry rotation speed was 250 msecs  and collimation was 0.6 mm. Nitroglycerin was not given. The 3D data set was reconstructed in 5% intervals of the 0-95% of the R-R cycle. Systolic and diastolic phases were analyzed on a dedicated workstation using MPR, MIP, and VRT modes. The patient received 100 cc of contrast. FINDINGS: Image quality: Excellent. Noise artifact is: Limited. Aortic valve prosthesis: Morphology consistent with [place] Edwards Perimount bioprosthesis. Internal diameter suggests 23 mm valve. The leaflets are freely mobile without calcification. There is severe prolapse of the leaflet in the [place]. There is no evidence of paravalvular leak. Valve in valve analysis: A valve in valve analysis was performed using a 23 mm S3 based on recommendations from the valve in valve application. The virtual valve to coronary (VTC) distance for the left main is 7.8 mm. The ostium of the RCA takes off above the virtual valve. The distance between the tip of the virtual valve is still 7.5 mm from the RCA ostium. RCA appears to be non-dominant. Overall, low risk for coronary obstruction. Optimal coplanar projection: LAO 4 CRA 16 Sinus of Valsalva Measurements: Non-coronary: 37 mm Right-coronary: 36 mm Left-coronary: 37 mm Sinotubular Junction: 28 mm Ascending Thoracic Aorta: 34 mm Coronary Arteries: Normal coronary origin. Left dominance. The study was performed without use of NTG and is insufficient for plaque evaluation. Please refer to recent cardiac catheterization for coronary assessment. There is severe 3-vessel coronary calcifications. 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 Cardiac Morphology: Right Atrium: Right atrial size is within normal limits. Contrast reflux into the IVC consistent with elevated RA pressure. Right Ventricle: The right ventricular cavity is within normal limits. Left Atrium: Left atrial size is normal in size with no left atrial appendage filling defect. Left Ventricle: The ventricular cavity size is dilated. Pulmonary  arteries: Dilated pulmonary artery suggestive of pulmonary hypertension. Pulmonary veins: Normal pulmonary venous drainage. Pericardium: Normal thickness with no significant effusion or calcium present. Mitral Valve:  The mitral valve is normal structure with mild annular calcium. Extra-cardiac findings: See attached radiology report for non-cardiac structures. IMPRESSION: 1. 23 mm bioprosthetic aortic valve prosthesis (likely a CE Perimount). 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 2. Valve in valve analysis supports a 23 mm S3. Low-risk for coronary obstruction. 3. Optimal Fluoroscopic Angle for Delivery: LAO 4 CRA 16 4. Dilated pulmonary artery suggestive of pulmonary hypertension. Contrast reflux into the IVC consistent with elevated RA pressure. 5. Normal coronary origin. Left dominance. 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 [place] T. Audie Box, MD Electronically Signed   By: Eleonore Chiquito M.D.   On: 09/22/2021 19:06   Result Date: 09/22/2021 EXAM: OVER-READ INTERPRETATION  CT CHEST The following report is a limited chest CT over-read performed by radiologist Dr. Yetta Glassman of [facility], [place] on 09/19/2021. This over-read does not include interpretation of cardiac or coronary anatomy or pathology. The cardiac TAVR interpretation by the cardiologist is attached. COMPARISON:  None Available. FINDINGS: Extracardiac findings will be described separately under dictation for contemporaneously obtained CTA chest, abdomen and pelvis. IMPRESSION: Please see separate dictation for contemporaneously obtained CTA chest, abdomen and pelvis dated 09/19/2021 for full description of relevant extracardiac findings. Electronically Signed: By: Yetta Glassman M.D. On: 09/19/2021 15:55   CT ANGIO ABDOMEN PELVIS  W &/OR WO CONTRAST  Result Date: 09/19/2021 CLINICAL DATA:  Preop evaluation for aortic valve replacement EXAM: CT ANGIOGRAPHY CHEST, ABDOMEN AND PELVIS TECHNIQUE: Multidetector CT  imaging through the chest, abdomen and pelvis was performed using the standard protocol during bolus administration of intravenous contrast. Multiplanar reconstructed images and MIPs were obtained and reviewed to evaluate the vascular anatomy. RADIATION DOSE REDUCTION: This exam was performed according to the departmental dose-optimization program which includes automated exposure control, adjustment of the mA and/or kV according to patient size and/or use of iterative reconstruction technique. CONTRAST:  147m OMNIPAQUE IOHEXOL 350 MG/ML SOLN COMPARISON:  None Available. FINDINGS: CTA CHEST FINDINGS Cardiovascular: Cardiomegaly. No pericardial effusion. Left main and three-vessel coronary artery calcifications. Status post aortic valve replacement. Normal caliber thoracic aorta with severe atherosclerotic disease. Standard three-vessel aortic arch with no significant stenosis. Mediastinum/Nodes: Thyroid is unremarkable. Esophagus is unremarkable. No pathologically enlarged lymph nodes seen in the chest. Lungs/Pleura: Central airways are patent. Bronchial wall thickening and bilateral intra and interlobular septal thickening, compatible with pulmonary edema. Small bilateral pleural effusions. Mild bibasilar atelectasis. Musculoskeletal: Prior median sternotomy with intact sternal wires. No acute or significant osseous findings. CTA ABDOMEN AND PELVIS FINDINGS Hepatobiliary: No focal liver abnormality is seen. No gallstones, gallbladder wall thickening, or biliary dilatation. Pancreas: Unremarkable. No pancreatic ductal dilatation or surrounding inflammatory changes. Spleen: Normal in size without focal abnormality. Adrenals/Urinary Tract: Bilateral adrenal glands are unremarkable. No hydronephrosis or nephrolithiasis. Simple appearing exophytic cyst of the right kidney, no further follow-up imaging is recommended. Bladder is unremarkable. Stomach/Bowel: Stomach is within normal limits. Diverticulosis. No evidence of  bowel wall thickening, distention, or inflammatory changes. Vascular/lymphatic: No pathologically enlarged lymph nodes seen in the abdomen or pelvis. Moderate narrowing at the origin of the celiac artery due to calcified plaque. Moderate narrowing at the origin of the SMA due to calcified and noncalcified plaque. Moderate narrowing at the origins of the bilateral renal arteries to calcified plaque. 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 Reproductive: Mild prostatomegaly with calcifications. Other: Small fat containing umbilical hernia. Calcified soft tissue structure of the  left lower quadrant, likely due to prior fat necrosis. No abdominopelvic ascites. Musculoskeletal: Prior total left hip replacement. No aggressive appearing osseous lesions. VASCULAR MEASUREMENTS PERTINENT TO TAVR: AORTA: Minimal Aortic Diameter-13.7 mm Severity of Aortic Calcification-severe RIGHT PELVIS: Right Common Iliac Artery - Minimal Diameter-6.9 mm Tortuosity-mild Calcification-severe Right External Iliac Artery - Minimal Diameter-9.2 mm Tortuosity-moderate Calcification-mild Right Common Femoral Artery - Minimal Diameter-6.6 mm Tortuosity-none Calcification-mild LEFT PELVIS: Left Common Iliac Artery - Minimal Diameter-6.9 mm Tortuosity-mild Calcification-severe Left External Iliac Artery - Minimal Diameter-9.2 mm Tortuosity-mild Calcification-mild Left Common Femoral Artery - Minimal Diameter-6.6 mm Tortuosity-mild Calcification-none Review of the MIP images confirms the above findings. IMPRESSION: 1. Vascular findings and measurements pertinent to potential TAVR procedure, as detailed above. 2. Prior aortic valve replacement. 3. Severe aortoiliac atherosclerosis. 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 4. Left main and 3 vessel coronary artery disease.  5. Pulmonary edema and small bilateral pleural effusions. Electronically Signed   By: Yetta Glassman M.D.   On: 09/19/2021 19:09   CT ANGIO CHEST AORTA W/CM & OR WO/CM  Result Date: 09/19/2021 CLINICAL DATA:  Preop evaluation for aortic valve replacement EXAM: CT ANGIOGRAPHY CHEST, ABDOMEN AND PELVIS TECHNIQUE: Multidetector CT imaging through the chest, abdomen and pelvis was performed using the standard protocol during bolus administration of intravenous contrast. Multiplanar reconstructed images and MIPs were obtained and reviewed to evaluate the vascular anatomy. RADIATION DOSE REDUCTION: This exam was performed according to the departmental dose-optimization program which includes automated exposure control, adjustment of the mA and/or kV according to patient size and/or use of iterative reconstruction technique. CONTRAST:  11m OMNIPAQUE IOHEXOL 350 MG/ML SOLN COMPARISON:  None Available. FINDINGS: CTA CHEST FINDINGS Cardiovascular: Cardiomegaly. No pericardial effusion. Left main and three-vessel coronary artery calcifications. Status post aortic valve replacement. Normal caliber thoracic aorta with severe atherosclerotic disease. Standard three-vessel aortic arch with no significant stenosis. Mediastinum/Nodes: Thyroid is unremarkable. Esophagus is unremarkable. No pathologically enlarged lymph nodes seen in the chest. Lungs/Pleura: Central airways are patent. Bronchial wall thickening and bilateral intra and interlobular septal thickening, compatible with pulmonary edema. Small bilateral pleural effusions. Mild bibasilar atelectasis. Musculoskeletal: Prior median sternotomy with intact sternal wires. No acute or significant osseous findings. CTA ABDOMEN AND PELVIS FINDINGS Hepatobiliary: No focal liver abnormality is seen. No gallstones, gallbladder wall thickening, or biliary dilatation. Pancreas: Unremarkable. No pancreatic ductal dilatation or surrounding inflammatory changes. Spleen: Normal in  size without focal abnormality. Adrenals/Urinary Tract: Bilateral adrenal glands are unremarkable. No hydronephrosis or nephrolithiasis. Simple appearing exophytic cyst of the right kidney, no further follow-up imaging is recommended. Bladder is unremarkable. Stomach/Bowel: Stomach is within normal limits. Diverticulosis. No evidence of bowel wall thickening, distention, or inflammatory changes. Vascular/lymphatic: No pathologically enlarged lymph nodes seen in the abdomen or pelvis. Moderate narrowing at the origin of the celiac artery due to calcified plaque. Moderate narrowing at the origin of the SMA due to calcified and noncalcified plaque. Moderate narrowing at the origins of the bilateral renal arteries to calcified plaque. 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 Reproductive: Mild prostatomegaly with calcifications. Other: Small fat containing umbilical hernia. Calcified soft tissue structure of the left lower quadrant, likely due to prior fat necrosis. No abdominopelvic ascites. Musculoskeletal: Prior total left hip replacement. No aggressive appearing osseous lesions. VASCULAR MEASUREMENTS PERTINENT TO TAVR: AORTA: Minimal Aortic Diameter-13.7 mm Severity of Aortic Calcification-severe RIGHT PELVIS: Right Common Iliac  Artery - Minimal Diameter-6.9 mm Tortuosity-mild Calcification-severe Right External Iliac Artery - Minimal Diameter-9.2 mm Tortuosity-moderate Calcification-mild Right Common Femoral Artery - Minimal Diameter-6.6 mm Tortuosity-none Calcification-mild LEFT PELVIS: Left Common Iliac Artery - Minimal Diameter-6.9 mm Tortuosity-mild Calcification-severe Left External Iliac Artery - Minimal Diameter-9.2 mm Tortuosity-mild Calcification-mild Left Common Femoral Artery - Minimal Diameter-6.6 mm Tortuosity-mild Calcification-none Review of the MIP images confirms the above findings. IMPRESSION: 1. Vascular  findings and measurements pertinent to potential TAVR procedure, as detailed above. 2. Prior aortic valve replacement. 3. Severe aortoiliac atherosclerosis. 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 4. Left main and 3 vessel coronary artery disease. 5. Pulmonary edema and small bilateral pleural effusions. Electronically Signed   By: Yetta Glassman M.D.   On: 09/19/2021 19:09   ECHOCARDIOGRAM COMPLETE  Result Date: 09/18/2021    ECHOCARDIOGRAM REPORT   Patient Name:   ARLANDER GILLEN  Date of Exam: 09/18/2021 Medical Rec #:  [id number]     Height:       70.0 in Accession #:    [id number]    Weight:       214.0 lb Date of Birth:  1940/08/03      BSA:          2.148 m Patient Age:    62 years      BP:           140/62 mmHg Patient Gender: M             HR:           85 bpm. Exam Location:  [facility] Procedure: 2D Echo, Cardiac Doppler and Color Doppler Indications:    R06.00 Dyspnea  History:        Patient has prior history of Echocardiogram examinations, most                 recent 01/09/2020. CAD, AVR-2009. and Prior CABG,                 Signs/Symptoms:Murmur; Risk Factors:Hypertension and                 Dyslipidemia.                 Aortic Valve: unknown bioprosthetic valve is present in the                 aortic position. Procedure Date: 2009.  Sonographer:    Cresenciano Lick RDCS Referring Phys: [id number] PAULA V ROSS IMPRESSIONS  1. Normal LV function; s/p AVR with elevated mean gradient of 38 mmHg and peak velocity of 4.3 m/s suggestive of severe AS; AI appears mild but pressure half time of 156 suggests possibly more significant; gradient worse compared to 01/09/20; suggest TEE to better assess prosthetic aortic valve.  2. Left ventricular ejection fraction, by estimation, is 60 to 65%. The left ventricle has normal function. The left ventricle has no regional wall motion abnormalities. The left ventricular  internal cavity size was mildly dilated. Left ventricular diastolic parameters are consistent with Grade II diastolic dysfunction (pseudonormalization).  3. Right ventricular systolic function is normal. The right ventricular size is mildly enlarged.  4. Left atrial size was severely dilated.  5. The mitral valve is normal in structure. Mild mitral valve regurgitation. No evidence of mitral stenosis.  6. The aortic valve has been repaired/replaced. Aortic valve regurgitation is mild. Severe aortic valve stenosis. There is a unknown bioprosthetic valve present in  the aortic position. Procedure Date: 2009.  7. The inferior vena cava is normal in size with greater than 50% respiratory variability, suggesting right atrial pressure of 3 mmHg. FINDINGS  Left Ventricle: Left ventricular ejection fraction, by estimation, is 60 to 65%. The left ventricle has normal function. The left ventricle has no regional wall motion abnormalities. The left ventricular internal cavity size was mildly dilated. There is  no left ventricular hypertrophy. Left ventricular diastolic parameters are consistent with Grade II diastolic dysfunction (pseudonormalization). Right Ventricle: The right ventricular size is mildly enlarged. Right ventricular systolic function is normal. Left Atrium: Left atrial size was severely dilated. Right Atrium: Right atrial size was normal in size. Pericardium: There is no evidence of pericardial effusion. Mitral Valve: The mitral valve is normal in structure. Mild mitral annular calcification. Mild mitral valve regurgitation. No evidence of mitral valve stenosis. Tricuspid Valve: The tricuspid valve is normal in structure. Tricuspid valve regurgitation is mild . No evidence of tricuspid stenosis. Aortic Valve: The aortic valve has been repaired/replaced. Aortic valve regurgitation is mild. Aortic regurgitation PHT measures 156 msec. Severe aortic stenosis is present. Aortic valve mean gradient measures 36.2 mmHg.  Aortic valve peak gradient measures 69.4 mmHg. There is a unknown bioprosthetic valve present in the aortic position. Procedure Date: 2009. Pulmonic Valve: The pulmonic valve was normal in structure. Pulmonic valve regurgitation is trivial. No evidence of pulmonic stenosis. Aorta: The aortic root is normal in size and structure. Venous: The inferior vena cava is normal in size with greater than 50% respiratory variability, suggesting right atrial pressure of 3 mmHg. IAS/Shunts: No atrial level shunt detected by color flow Doppler. Additional Comments: Normal LV function; s/p AVR with elevated mean gradient of 38 mmHg and peak velocity of 4.3 m/s suggestive of severe AS; AI appears mild but pressure half time of 156 suggests possibly more significant; gradient worse compared to 01/09/20; suggest TEE to better assess prosthetic aortic valve.  LEFT VENTRICLE PLAX 2D LVIDd:         5.90 cm Diastology LVIDs:         4.10 cm LV e' medial:    8.33 cm/s LV PW:         1.00 cm LV E/e' medial:  19.9 LV IVS:        1.00 cm LV e' lateral:   8.33 cm/s                        LV E/e' lateral: 19.9  RIGHT VENTRICLE             IVC RV Basal diam:  5.10 cm     IVC diam: 1.65 cm RV S prime:     12.73 cm/s TAPSE (M-mode): 2.6 cm LEFT ATRIUM              Index        RIGHT ATRIUM           Index LA diam:        5.90 cm  2.75 cm/m   RA Area:     25.70 cm LA Vol (A2C):   87.3 ml  40.64 ml/m  RA Volume:   92.20 ml  42.92 ml/m LA Vol (A4C):   111.0 ml 51.67 ml/m LA Biplane Vol: 102.0 ml 47.48 ml/m  AORTIC VALVE AV Vmax:           416.50 cm/s AV Vmean:          283.400 cm/s  AV VTI:            0.875 m AV Peak Grad:      69.4 mmHg AV Mean Grad:      36.2 mmHg LVOT Vmax:         86.97 cm/s LVOT Vmean:        61.167 cm/s LVOT VTI:          0.200 m LVOT/AV VTI ratio: 0.23 AI PHT:            156 msec  AORTA Ao Root diam: 3.50 cm MITRAL VALVE                TRICUSPID VALVE MV Area (PHT): 4.64 cm     TR Peak grad:   44.6 mmHg MV Decel Time:  164 msec     TR Vmax:        334.00 cm/s MV E velocity: 166.00 cm/s MV A velocity: 136.50 cm/s  SHUNTS MV E/A ratio:  1.22         Systemic VTI: 0.20 m Kirk Ruths MD Electronically signed by Kirk Ruths MD Signature Date/Time: 09/18/2021/3:23:26 PM    Final     Disposition   Pt is being discharged home today in good condition.  Follow-up Plans & Appointments    Follow-up Information     Eileen Stanford, PA-C. Go on 10/08/2021.   Specialties: Cardiology, Radiology Why: @ 3:30pm, please arrive at least 10 minutes early. Contact information: [place] [phone]                Discharge Instructions     Amb Referral to Cardiac Rehabilitation   Complete by: As directed    Diagnosis: Valve Repair   Valve: Aortic   After initial evaluation and assessments completed: Virtual Based Care may be provided alone or in conjunction with Phase 2 Cardiac Rehab based on patient barriers.: Yes   Call MD for:  difficulty breathing, headache or visual disturbances   Complete by: As directed    Call MD for:  extreme fatigue   Complete by: As directed    Call MD for:  hives   Complete by: As directed    Call MD for:  persistant dizziness or light-headedness   Complete by: As directed    Call MD for:  persistant nausea and vomiting   Complete by: As directed    Call MD for:  redness, tenderness, or signs of infection (pain, swelling, redness, odor or green/yellow discharge around incision site)   Complete by: As directed    Call MD for:  severe uncontrolled pain   Complete by: As directed    Call MD for:  temperature >100.4   Complete by: As directed    Diet - low sodium heart healthy   Complete by: As directed    Discharge instructions   Complete by: As directed    ACTIVITY AND EXERCISE  Daily activity and exercise are an important part of your recovery. People recover at different rates depending on their general health and type of  valve procedure.  Most people recovering from TAVR feel better relatively quickly   No lifting, pushing, pulling more than 10 pounds (examples to avoid: groceries, vacuuming, gardening, golfing):             - For one week with a procedure through the groin.             - For six weeks for  procedures through the chest wall or neck. NOTE: You will typically see one of our providers 7-14 days after your procedure to discuss Leroy the above activities.      DRIVING  Do not drive until you are seen for follow up and cleared by a provider. Generally, we ask patient to not drive for 1 week after their procedure.  If you have been told by your doctor in the past that you may not drive, you must talk with him/her before you begin driving again.   DRESSING  Groin site: you may leave the clear dressing over the site for up to one week or until it falls off.   HYGIENE  If you had a femoral (leg) procedure, you may take a shower when you return home. After the shower, pat the site dry. Do NOT use powder, oils or lotions in your groin area until the site has completely healed.  If you had a chest procedure, you may shower when you return home unless specifically instructed not to by your discharging practitioner.             - DO NOT scrub incision; pat dry with a towel.             - DO NOT apply any lotions, oils, powders to the incision.             - No tub baths / swimming for at least 2 weeks.  If you notice any fevers, chills, increased pain, swelling, bleeding or pus, please contact your doctor.   ADDITIONAL INFORMATION  If you are going to have an upcoming dental procedure, please contact our office as you will require antibiotics ahead of time to prevent infection on your heart valve.    If you have any questions or concerns you can call the structural heart phone during normal business hours 8am-4pm. If you have an urgent need after hours or weekends please call  [phone] to talk to the on call provider for general cardiology. If you have an emergency that requires immediate attention, please call 911.    After TAVR Checklist  Check  Test Description  Follow up appointment in 1-2 weeks  You will see our structural heart advanced practice provider. Your incision sites will be checked and you will be cleared to drive and resume all normal activities if you are doing well.    1 month echo and follow up  You will have an echo to check on your new heart valve and be seen back in the office by a structural heart advanced practice provider.  Follow up with your primary cardiologist You will need to be seen by your primary cardiologist in the following 3-6 months after your 1 month appointment in the valve clinic.   1 year echo and follow up You will have another echo to check on your heart valve after 1 year and be seen back in the office by a structural heart advanced practice provider. This your last structural heart visit.  Bacterial endocarditis prophylaxis  You will have to take antibiotics for the rest of your life before all dental procedures (even teeth cleanings) to protect your heart valve. Antibiotics are also required before some surgeries. Please check with your cardiologist before scheduling any surgeries. Also, please make sure to tell us if you have a penicillin allergy as you will require an alternative antibiotic.   Increase activity slowly   Complete by: As directed  Discharge Medications   Allergies as of 10/01/2021       Reactions   Sulfa Antibiotics Other (See Comments)   Carlyn Reichert Syndrome   Bactrim [sulfamethoxazole-trimethoprim] Other (See Comments)   Carlyn Reichert Syndrome        Medication List     STOP taking these medications    Entresto 49-51 MG Generic drug: sacubitril-valsartan       TAKE these medications    acetaminophen 500 MG tablet Commonly known as: TYLENOL Take 1,000 mg by mouth daily  as needed.   amiodarone 200 MG tablet Commonly known as: PACERONE TAKE ONE-HALF TABLET BY MOUTH  DAILY   amLODipine 5 MG tablet Commonly known as: NORVASC Take 1 tablet (5 mg total) by mouth daily.   aspirin EC 81 MG tablet Take 1 tablet (81 mg total) by mouth daily. Swallow whole.   atorvastatin 80 MG tablet Commonly known as: LIPITOR TAKE 1 TABLET BY MOUTH ONCE  DAILY   carvedilol 6.25 MG tablet Commonly known as: COREG TAKE 1 TABLET BY MOUTH  TWICE DAILY   ezetimibe 10 MG tablet Commonly known as: ZETIA TAKE 1 TABLET BY MOUTH DAILY   furosemide 40 MG tablet Commonly known as: Lasix Take 1 tablet (40 mg total) by mouth daily. What changed:  how much to take when to take this   levothyroxine 50 MCG tablet Commonly known as: SYNTHROID TAKE 1 TABLET BY MOUTH DAILY  BEFORE BREAKFAST   potassium chloride SA 20 MEQ tablet Commonly known as: KLOR-CON M Take 1 tablet (20 mEq total) by mouth daily.   tamsulosin 0.4 MG Caps capsule Commonly known as: FLOMAX Take 0.4 mg by mouth daily.        Outstanding Labs/Studies   None   Duration of Discharge Encounter   Greater than 30 minutes including physician time.  SignedKathyrn Drown, NP 10/01/2021, 12:36 PM [phone]   ATTENDING ATTESTATION:  After conducting a review of all available clinical information with the care team, interviewing the patient, and performing a physical exam, I agree with the findings and plan described in this note.   GEN: No acute distress.   HEENT:  MMM, no JVD, no scleral icterus Cardiac: RRR, no murmurs, rubs, or gallops.  Respiratory: Clear to auscultation bilaterally. GI: Soft, nontender, non-distended  MS: No edema; No deformity. Neuro:  Nonfocal  Vasc:  +2 radial pulses  Patient doing well after uncomplicated valve in valve TAVR with 23 mm SAPIEN 3 ultra valve from right transfemoral approach.  The patient's echocardiogram was reassuring with only mild paravalvular leak.   Will discharge today and adjust p.o. diuretics.  His peripheral edema is much improved.  Lenna Sciara, MD Pager [phone]

## 2021-09-30 NOTE — Progress Notes (Signed)
Mobility Specialist Progress Note:   09/30/21 1400  Mobility  Activity Ambulated independently in hallway  Level of Assistance Modified independent, requires aide device or extra time (IV Pole)  Assistive Device None  Distance Ambulated (ft) 470 ft  Activity Response Tolerated well  $Mobility charge 1 Mobility   Pre-mobility: 135/80 (93) BP Post-mobility: 143/68 (91) BP  Pt in bed and agreeable. Lateral unsteadiness notes throughout ambulation. Minimal bleeding noted and right groin site, RN notified. Pt in bed with all needs met and call bell in reach.   Luis Herrera Mobility Specialist-Acute Rehab Secure Chat only

## 2021-09-30 NOTE — Transfer of Care (Signed)
Immediate Anesthesia Transfer of Care Note  Patient: Luis Herrera  Procedure(s) Performed: Transcatheter Aortic Valve Replacement, Transfemoral (Right) INTRAOPERATIVE TRANSTHORACIC ECHOCARDIOGRAM  Patient Location: Cath Lab  Anesthesia Type:MAC  Level of Consciousness: awake and alert   Airway & Oxygen Therapy: Patient Spontanous Breathing and Patient connected to nasal cannula oxygen  Post-op Assessment: Report given to RN and Post -op Vital signs reviewed and stable  Post vital signs: Reviewed and stable  Last Vitals:  Vitals Value Taken Time  BP 118/45 09/30/21 0949  Temp    Pulse 62 09/30/21 0951  Resp 21 09/30/21 0951  SpO2 98 % 09/30/21 0951  Vitals shown include unvalidated device data.  Last Pain:  Vitals:   09/30/21 0439  TempSrc: Oral  PainSc:          Complications: No notable events documented.

## 2021-09-30 NOTE — Progress Notes (Signed)
  Echocardiogram 2D Echocardiogram limited has been performed.  Luis Herrera M 09/30/2021, 9:19 AM

## 2021-09-30 NOTE — Anesthesia Procedure Notes (Signed)
Arterial Line Insertion Start/End8/15/2023 6:55 AM, 09/30/2021 7:00 AM Performed by: CRNA  Patient location: Pre-op. Preanesthetic checklist: patient identified, IV checked, site marked, risks and benefits discussed, surgical consent, monitors and equipment checked, pre-op evaluation, timeout performed and anesthesia consent Lidocaine 1% used for infiltration Right, radial was placed Catheter size: 20 G Hand hygiene performed  and maximum sterile barriers used  Allen's test indicative of satisfactory collateral circulation Attempts: 1 Procedure performed without using ultrasound guided technique. Following insertion, dressing applied and Biopatch. Post procedure assessment: normal  Additional procedure comments: Inserted by [name], SRNA under direct supervision by CRNA. Marland Kitchen

## 2021-09-30 NOTE — Anesthesia Preprocedure Evaluation (Signed)
Anesthesia Evaluation  Patient identified by MRN, date of birth, ID band Patient awake    Reviewed: Allergy & Precautions, NPO status , Patient's Chart, lab work & pertinent test results  Airway Mallampati: II  TM Distance: >3 FB Neck ROM: Full    Dental no notable dental hx.    Pulmonary neg pulmonary ROS, former smoker,    Pulmonary exam normal breath sounds clear to auscultation       Cardiovascular hypertension, + CAD and + CABG  + Valvular Problems/Murmurs AS and AI  Rhythm:Regular Rate:Normal + Systolic murmurs ECHO 50/93/2671  1. Normal LV function; s/p AVR with elevated mean gradient of 38 mmHg and  peak velocity of 4.3 m/s suggestive of severe AS; AI appears mild but  pressure half time of 156 suggests possibly more significant; gradient  worse compared to 01/09/20; suggest  TEE to better assess prosthetic aortic valve.  2. Left ventricular ejection fraction, by estimation, is 60 to 65%. The  left ventricle has normal function. The left ventricle has no regional  wall motion abnormalities. The left ventricular internal cavity size was  mildly dilated. Left ventricular  diastolic parameters are consistent with Grade II diastolic dysfunction  (pseudonormalization).  3. Right ventricular systolic function is normal. The right ventricular  size is mildly enlarged.  4. Left atrial size was severely dilated.  5. The mitral valve is normal in structure. Mild mitral valve  regurgitation. No evidence of mitral stenosis.  6. The aortic valve has been repaired/replaced. Aortic valve  regurgitation is mild. Severe aortic valve stenosis. There is a unknown  bioprosthetic valve present in the aortic position. Procedure Date: 2009.  7. The inferior vena cava is normal in size with greater than 50%  respiratory variability, suggesting right atrial pressure of 3 mmHg   Neuro/Psych negative neurological ROS  negative psych  ROS   GI/Hepatic negative GI ROS, Neg liver ROS,   Endo/Other  Hypothyroidism   Renal/GU negative Renal ROS  negative genitourinary   Musculoskeletal negative musculoskeletal ROS (+)   Abdominal   Peds negative pediatric ROS (+)  Hematology  (+) Blood dyscrasia, anemia ,   Anesthesia Other Findings   Reproductive/Obstetrics negative OB ROS                             Anesthesia Physical Anesthesia Plan  ASA: 4  Anesthesia Plan: MAC   Post-op Pain Management: Minimal or no pain anticipated   Induction: Intravenous  PONV Risk Score and Plan: 1 and Propofol infusion and Treatment may vary due to age or medical condition  Airway Management Planned: Simple Face Mask  Additional Equipment:   Intra-op Plan:   Post-operative Plan:   Informed Consent: I have reviewed the patients History and Physical, chart, labs and discussed the procedure including the risks, benefits and alternatives for the proposed anesthesia with the patient or authorized representative who has indicated his/her understanding and acceptance.     Dental advisory given  Plan Discussed with: CRNA and Surgeon  Anesthesia Plan Comments:         Anesthesia Quick Evaluation

## 2021-09-30 NOTE — Progress Notes (Signed)
Pt arrived from cath lab. CHG wipes, VSS, and CCMD called and verified. Right groin dressing saturated and cath lab RN changed. This RN continued to monitor throughout shift. Slow ooze from site. MD assessed and OK. Area soft without evidence of hematoma. Payton Emerald, RN

## 2021-09-30 NOTE — Anesthesia Postprocedure Evaluation (Signed)
Anesthesia Post Note  Patient: Luis Herrera  Procedure(s) Performed: Transcatheter Aortic Valve Replacement, Transfemoral (Right) INTRAOPERATIVE TRANSTHORACIC ECHOCARDIOGRAM     Patient location during evaluation: PACU Anesthesia Type: MAC Level of consciousness: awake and alert Pain management: pain level controlled Vital Signs Assessment: post-procedure vital signs reviewed and stable Respiratory status: spontaneous breathing, nonlabored ventilation, respiratory function stable and patient connected to nasal cannula oxygen Cardiovascular status: stable and blood pressure returned to baseline Postop Assessment: no apparent nausea or vomiting Anesthetic complications: no   No notable events documented.  Last Vitals:  Vitals:   09/30/21 0810 09/30/21 0950  BP:  (!) 118/45  Pulse: 65 61  Resp:  17  Temp:  (!) 27.3 C  SpO2:      Last Pain:  Vitals:   09/30/21 0950  TempSrc: Temporal  PainSc: 0-No pain                 Caira Poche S

## 2021-09-30 NOTE — Progress Notes (Signed)
  [facility] VALVE TEAM  Patient doing well s/p TAVR. He is hemodynamically stable. Groin site/R IJ sites are  stable. ECG with no high grade block. Plan to DC arterial line and transfer to 4E. Plan for early ambulation after bedrest completed and hopeful discharge over the next 24-48 hours.   Kathyrn Drown NP-C Structural Heart Team  Pager: [phone] Phone: [phone]

## 2021-10-01 ENCOUNTER — Inpatient Hospital Stay (HOSPITAL_COMMUNITY): Payer: Medicare Other

## 2021-10-01 DIAGNOSIS — Z20822 Contact with and (suspected) exposure to covid-19: Secondary | ICD-10-CM | POA: Diagnosis not present

## 2021-10-01 DIAGNOSIS — T82857A Stenosis of cardiac prosthetic devices, implants and grafts, initial encounter: Secondary | ICD-10-CM | POA: Diagnosis not present

## 2021-10-01 DIAGNOSIS — Z952 Presence of prosthetic heart valve: Secondary | ICD-10-CM

## 2021-10-01 DIAGNOSIS — I110 Hypertensive heart disease with heart failure: Secondary | ICD-10-CM | POA: Diagnosis not present

## 2021-10-01 DIAGNOSIS — Z006 Encounter for examination for normal comparison and control in clinical research program: Secondary | ICD-10-CM | POA: Diagnosis not present

## 2021-10-01 LAB — CBC
HCT: 32.8 % — ABNORMAL LOW (ref 39.0–52.0)
Hemoglobin: 10.2 g/dL — ABNORMAL LOW (ref 13.0–17.0)
MCH: 31.3 pg (ref 26.0–34.0)
MCHC: 31.1 g/dL (ref 30.0–36.0)
MCV: 100.6 fL — ABNORMAL HIGH (ref 80.0–100.0)
Platelets: 134 10*3/uL — ABNORMAL LOW (ref 150–400)
RBC: 3.26 MIL/uL — ABNORMAL LOW (ref 4.22–5.81)
RDW: 14.9 % (ref 11.5–15.5)
WBC: 10.1 10*3/uL (ref 4.0–10.5)
nRBC: 0 % (ref 0.0–0.2)

## 2021-10-01 LAB — BASIC METABOLIC PANEL
Anion gap: 5 (ref 5–15)
BUN: 17 mg/dL (ref 8–23)
CO2: 25 mmol/L (ref 22–32)
Calcium: 7.9 mg/dL — ABNORMAL LOW (ref 8.9–10.3)
Chloride: 111 mmol/L (ref 98–111)
Creatinine, Ser: 1.21 mg/dL (ref 0.61–1.24)
GFR, Estimated: >60 mL/min (ref >60)
Glucose, Bld: 106 mg/dL — ABNORMAL HIGH (ref 70–99)
Potassium: 4.4 mmol/L (ref 3.5–5.1)
Sodium: 141 mmol/L (ref 135–145)

## 2021-10-01 LAB — MAGNESIUM: Magnesium: 2.1 mg/dL (ref 1.7–2.4)

## 2021-10-01 MED ORDER — FUROSEMIDE 40 MG PO TABS: 40.0000 mg | ORAL_TABLET | Freq: Every day | ORAL | 11 refills | Status: DC

## 2021-10-01 NOTE — Progress Notes (Signed)
  Echocardiogram 2D Echocardiogram has been performed.  Luis Herrera 10/01/2021, 10:26 AM

## 2021-10-01 NOTE — Progress Notes (Signed)
Patient discharging home with support from wife. IV removed without complications. Tele removed and CCMD ordered. Discharge instructions given and medication administration discussed. All questions answered.  Luis Herrera

## 2021-10-01 NOTE — Progress Notes (Signed)
CARDIAC REHAB PHASE I   PRE:  Rate/Rhythm: 81 SR  BP:  Supine:   Sitting: 128/60  Standing:    SaO2: 95% RA  MODE:  Ambulation: 460 ft   POST:  Rate/Rhythm: 97 SR  BP:  Supine:   Sitting: 139/64  Standing:    SaO2: 93% RA Tolerated ambulation extremely well with minimal assistance and reports much less SOB since TAVR procedure yesterday.  Education completed re: home exercise progression, does want to attend outpatient cardiac rehab, referral made to [facility].  Completed activity restrictions, bathing, when to call the doctor, and low sodium diet, given handout on low sodium food choices. [phone]  Liliane Channel RN, BSN 10/01/2021 9:35 AM

## 2021-10-02 ENCOUNTER — Telehealth: Payer: Self-pay | Admitting: Cardiology

## 2021-10-02 ENCOUNTER — Other Ambulatory Visit: Payer: Self-pay | Admitting: *Deleted

## 2021-10-02 LAB — ECHOCARDIOGRAM COMPLETE
AR max vel: 1.98 cm2
AV Area VTI: 1.79 cm2
AV Area mean vel: 2.01 cm2
AV Mean grad: 19 mmHg
AV Peak grad: 34.6 mmHg
Ao pk vel: 2.94 m/s
Area-P 1/2: 4.21 cm2
Calc EF: 44.9 %
Height: 71 in
S' Lateral: 4.2 cm
Single Plane A2C EF: 45 %
Single Plane A4C EF: 49.8 %
Weight: 3393.6 oz

## 2021-10-02 NOTE — Patient Outreach (Signed)
  Care Coordination [facility] Note Transition Care Management Unsuccessful Follow-up Telephone Call  Date of discharge and from where:  [facility] [phone]  Attempts:  1st Attempt  Reason for unsuccessful TCM follow-up call:  Left voice message  [facility] [phone]

## 2021-10-02 NOTE — Patient Outreach (Signed)
  Care Coordination [facility] Note Transition Care Management Follow-up Telephone Call Date of discharge and from where: [facility] [phone] How have you been since you were released from the hospital? Doing good no pain Any questions or concerns? No  Items Reviewed: Did the pt receive and understand the discharge instructions provided? Yes  Medications obtained and verified? Yes  Other? No  Any new allergies since your discharge? No  Dietary orders reviewed? Yes Do you have support at home? Yes   Home Care and Equipment/Supplies: Were home health services ordered? no If so, what is the name of the agency? N/a  Has the agency set up a time to come to the patient's home? not applicable Were any new equipment or medical supplies ordered?  No What is the name of the medical supply agency? N/a Were you able to get the supplies/equipment? not applicable Do you have any questions related to the use of the equipment or supplies? No  Functional Questionnaire: (I = Independent and D = Dependent) ADLs: I  Bathing/Dressing- I  Meal Prep- I  Eating- I  Maintaining continence- I  Transferring/Ambulation- I  Managing Meds- I  Follow up appointments reviewed:  PCP Hospital f/u appt confirmed? No  Specialist Hospital f/u appt confirmed? Yes  Follow-Ups: Go to Crista Luria (Cardiology) on 10/08/2021; @ 3:30pm, Echocardiogram [phone] 11:15 am  [facility] [phone] 12:30 Are transportation arrangements needed? No  If their condition worsens, is the pt aware to call PCP or go to the Emergency Dept.? Yes Was the patient provided with contact information for the PCP's office or ED? Yes Was to pt encouraged to call back with questions or concerns? Yes  SDOH assessments and interventions completed:   Yes  Care Coordination Interventions Activated:  Yes   Care Coordination Interventions:  Referred for Care Coordination Services:  RN Care Coordinator . Patient wants additional education and  management on CHF   Encounter Outcome:  Pt. Visit Completed  Scheduled with [facility] [phone] [place] Management [phone]

## 2021-10-02 NOTE — Telephone Encounter (Signed)
  [place] VALVE TEAM   Patient contacted regarding discharge from [facility] on 10/01/21   Patient understands to follow up with provider Nell Range, PA-C at the [place] office Patient understands discharge instructions? Yes  Patient understands medications and regimen? Yes  Patient understands to bring all medications to this visit? Yes   Kathyrn Drown NP-C Structural Heart Team  Pager: [phone]

## 2021-10-03 ENCOUNTER — Ambulatory Visit: Payer: Medicare Other | Admitting: Internal Medicine

## 2021-10-07 ENCOUNTER — Telehealth: Payer: Self-pay | Admitting: Family Medicine

## 2021-10-07 NOTE — Telephone Encounter (Signed)
Spoke with patient to schedule awv. He stated he just got home from having heart surgery.  He wanted call back later

## 2021-10-08 ENCOUNTER — Ambulatory Visit: Payer: Medicare Other | Admitting: Physician Assistant

## 2021-10-08 VITALS — BP 124/70 | HR 81 | Ht 71.0 in | Wt 197.0 lb

## 2021-10-08 DIAGNOSIS — Z952 Presence of prosthetic heart valve: Secondary | ICD-10-CM | POA: Diagnosis not present

## 2021-10-08 DIAGNOSIS — I493 Ventricular premature depolarization: Secondary | ICD-10-CM

## 2021-10-08 DIAGNOSIS — I2510 Atherosclerotic heart disease of native coronary artery without angina pectoris: Secondary | ICD-10-CM

## 2021-10-08 DIAGNOSIS — I5042 Chronic combined systolic (congestive) and diastolic (congestive) heart failure: Secondary | ICD-10-CM

## 2021-10-08 DIAGNOSIS — I10 Essential (primary) hypertension: Secondary | ICD-10-CM | POA: Diagnosis not present

## 2021-10-08 NOTE — Progress Notes (Unsigned)
[facility] [name]                                     Cardiology Office Note:    Date:  10/09/2021   ID:  Luis Herrera, DOB 14-Oct-1940, MRN [id number]  PCP:  [name], MD  [facility] HeartCare Cardiologist:  [name], MD  / Dr. Ali Lowe and Dr. Cyndia Bent, MD [facility] HeartCare Electrophysiologist:  None   Referring MD: [name], MD   [facility] s/p TAVR  History of Present Illness:    Luis Herrera is a 81 y.o. 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   Luis Herrera is followed by Dr. Harrington Challenger for his cardiology care. He underwent CABGx1 with AVR in 2009 while still living in [place]. His symptoms were similar at that time with SOB and fatigue prompting a cardiac catheterization and subsequent surgery. After moving to [place], he was followed at [facility] however transitioned to [facility] in 2015. His AVR has been followed with surveillance echocardiograms with stable gradients noted, mostly ranging in the 18-22 mmHg range. Echocardiogram from 12/2019 showed an LVEF at 55-60% with an unknown size bioprosthetic valve present in the aortic position with a mean gradient measuring 18.0 mmHg.    More recently he has been having issues with worsening SOB and fluid volume overload. He has been followed by Dr. Harrington Challenger on several occasions for Lasix adjustments. A repeat echocardiogram was obtained which showed normal LVEF and increased AV gradients with a mean at 38 mmHg and peak at 69.85mHg, and AVA by VTI at 0.87. Given this, Dr. RHarrington Challengerfelt he needed to be admitted for CHF with TAVR workup. On admission, he was treated with IV Lasix with resolution of his LE edema. He was seen by the Structural Heart team and underwent Pre  TAVR CT imaging. Given his response to Lasix, plan was to follow with Dr. TAli LoweOP 10/03/21 to further discuss pre TAVR cardiac catheterization and surgical evaluation. His home Lasix was increased to 657mdaily.    Unfortunately he required readmission for diuresis. He underwent successful TAVR with a 23 mm Edwards Sapien 3 UR via the TF  approach on 09/30/21. Post operative echo showed EF 45-50%, mild RV dysfunction, normally functioning VIV TAVR with a mean gradient of 19 mmHg and mild PVL as well as moderate MAC with mild MR/MS.  His ASA was restarted. He was discharged on Lasix 4077maily. Entresto held with slight bump in creat to 1.2.   Today the patient presents to clinic for follow up. He is losing weight rapidly. Daughter said he isn't eating well. He has a poor appetite. Also thinks he is feeling a little depressed and bored. He has some fatigue. He is breathing better with resolution of orthopnea. LE edema much improved.    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 TAli Loweand Dr. BCyndia Bent  Thyroid disease     Past Surgical History:  Procedure Laterality Date   AORTIC VALVE REPLACEMENT  2009   [place]  [place]   SVG-PDA w/ AVR   CORRECTION HAMMER TOE  2011   BILATERAL   GASTROCNEMIUS RECESSION  05/28/2011   INTRAOPERATIVE TRANSTHORACIC ECHOCARDIOGRAM N/A 09/30/2021   Procedure: INTRAOPERATIVE  TRANSTHORACIC ECHOCARDIOGRAM;  Surgeon: TEarly Osmond MD;  [place];  Service: Open Heart Surgery;  Laterality: N/A;   POLYPECTOMY     RIGHT/LEFT HEART CATH AND CORONARY/GRAFT ANGIOGRAPHY N/A 09/24/2021   Procedure: RIGHT/LEFT HEART CATH AND CORONARY/GRAFT ANGIOGRAPHY;  Surgeon: TEarly Osmond MD;  [place] [facility];  Service: Cardiovascular;  Laterality: N/A;   TONSILLECTOMY     TOTAL HIP ARTHROPLASTY Left 2000   TOTAL KNEE ARTHROPLASTY  2000   left   TOTAL KNEE ARTHROPLASTY  12/01/2011   Procedure: TOTAL KNEE ARTHROPLASTY;  Surgeon: MMauri Pole MD;  Location: [facility];  Service: Orthopedics;  Laterality: Right;   TRANSCATHETER AORTIC VALVE REPLACEMENT, TRANSFEMORAL Right 09/30/2021   Procedure: Transcatheter Aortic Valve Replacement, Transfemoral;  Surgeon: TEarly Osmond MD;  [place];  Service: Open Heart Surgery;  Laterality: Right;   WEIL OSTEOTOMY  05/28/2011    Current Medications: Current Meds  Medication Sig   acetaminophen (TYLENOL) 500 MG tablet Take 1,000 mg by mouth daily as needed.   amiodarone (PACERONE) 200 MG tablet TAKE ONE-HALF TABLET BY MOUTH  DAILY (Patient taking differently: Take 100 mg by mouth daily.)   amLODipine (NORVASC) 5 MG tablet Take 1 tablet (5 mg total) by mouth daily.   atorvastatin (LIPITOR) 80 MG tablet TAKE 1 TABLET BY MOUTH ONCE  DAILY   carvedilol (COREG) 6.25 MG tablet TAKE 1 TABLET BY MOUTH  TWICE DAILY   ezetimibe (ZETIA) 10 MG tablet TAKE 1 TABLET BY MOUTH DAILY   levothyroxine (SYNTHROID) 50 MCG tablet TAKE 1 TABLET BY MOUTH DAILY  BEFORE BREAKFAST   tamsulosin (FLOMAX) 0.4 MG CAPS capsule Take 0.4 mg by mouth daily.   [DISCONTINUED] furosemide (LASIX) 40 MG tablet Take 1 tablet (40 mg total) by mouth daily.   [DISCONTINUED] potassium chloride SA (KLOR-CON M) 20 MEQ tablet Take 1 tablet (20 mEq total) by mouth daily.     Allergies:   Sulfa antibiotics and Bactrim  [sulfamethoxazole-trimethoprim]   Social History   Socioeconomic History   Marital status: Married    Spouse name: Not on file   Number of children: Not on file   Years of education: Not on file   Highest education level: Not on file  Occupational History   Occupation: retired  Tobacco Use   Smoking status: Former    Types: Cigarettes    Quit date: 05/25/1973    Years since quitting: 48.4   Smokeless tobacco: Never  Vaping Use   Vaping Use: Never used  Substance and Sexual Activity   Alcohol use: Not Currently    Comment: rare   Drug use: No   Sexual activity: Not  Currently  Other Topics Concern   Not on file  Social History Narrative   Regular exercise-yes. Pt lives in [place] with his wife.   Social Determinants of Health   Financial Resource Strain: Not on file  Food Insecurity: Not on file  Transportation Needs: Not on file  Physical Activity: Not on file  Stress: Not on file  Social Connections: Not on file     Family History: The patient's family history includes Colon cancer (age of onset: 78) in his mother; Heart attack in his brother and father; Hyperlipidemia in an other family member; Hypertension in his brother, sister, and another family member. There is no history of Stroke, Esophageal cancer, Rectal cancer, or Stomach cancer.  ROS:   Please see the history of present illness.    All other systems reviewed and are negative.  EKGs/Labs/Other Studies Reviewed:    The following studies were reviewed today:  TAVR OPERATIVE NOTE     Date of Procedure:                09/30/2021   Preoperative Diagnosis:      Severe Aortic Stenosis    Postoperative Diagnosis:    Same    Procedure:        Transcatheter Aortic Valve Replacement (ViV)- Transfemoral Approach             Edwards Sapien 3 Resilia THV (size 23 mm, model # 9755RLS, serial # [id number])              Co-Surgeons:                         Gaye Pollack, MD and Lenna Sciara,  MD Anesthesiologist:                  Myrtie Soman, MD   Echocardiographer:              Eleonore Chiquito, MD   Pre-operative Echo Findings: Severe bioprosthetic aortic valve degeneration Normal left ventricular systolic function   Post-operative Echo Findings: Mild paravalvular leak Normal left ventricular systolic function   Left Heart Catheterization Findings: Left ventricular end-diastolic pressure of 25 mmHg   _____________   Echo 10/01/21:  IMPRESSIONS   1. 23 mm S3 ViV TAVR (prior 23 mm failed bioprosthesis). Vmax 2.9 m/s, MG 19 mmHG, EOA 1.79, DI 0.46. Mild paravalvular leak in the 2-3 o'clock position. The aortic valve has been repaired/replaced. Aortic valve regurgitation is mild. There is a 23 mm Sapien prosthetic (TAVR) valve present in the aortic position. Procedure Date: 09/30/2021.   2. Left ventricular ejection fraction, by estimation, is 45 to 50%. The  left ventricle has mildly decreased function. The left ventricle  demonstrates global hypokinesis. There is mild concentric left ventricular  hypertrophy. Indeterminate diastolic filling due to E-A fusion.   3. Right ventricular systolic function is mildly reduced. The right  ventricular size is normal. Tricuspid regurgitation signal is inadequate  for assessing PA pressure.   4. Left atrial size was moderately dilated.   5. The mitral valve is degenerative. Mild mitral valve regurgitation.  Mild mitral stenosis. The mean mitral valve gradient is 6.0 mmHg with  average heart rate of 78 bpm. Moderate mitral annular calcification.   6. The inferior vena cava is dilated in size with >50% respiratory  variability, suggesting right atrial pressure of 8 mmHg.   EKG:  EKG is ordered today.  The ekg ordered today demonstrates  sinus with old RBBB.   Recent Labs: 09/18/2021: B Natriuretic Peptide 2,254.9; TSH 3.605 09/22/2021: NT-Pro BNP 12,282 09/29/2021: ALT 19 10/01/2021: Hemoglobin 10.2; Magnesium 2.1; Platelets 134 10/08/2021:  BUN 42; Creatinine, Ser 1.49; Potassium 4.9; Sodium 138  Recent Lipid Panel    Component Value Date/Time   CHOL 150 12/18/2019 1005   TRIG 96 12/18/2019 1005   HDL 54 12/18/2019 1005   CHOLHDL 2.8 12/18/2019 1005   CHOLHDL 3 11/14/2015 0954   VLDL 16.8 11/14/2015 0954   LDLCALC 78 12/18/2019 1005     Risk Assessment/Calculations:       Physical Exam:    VS:  BP 124/70   Pulse 81   Ht _0  (1.803 m)   Wt 197 lb (89.4 kg)   BMI 27.48 kg/m     Wt Readings from Last 3 Encounters:  10/08/21 197 lb (89.4 kg)  10/01/21 212 lb 1.6 oz (96.2 kg)  09/22/21 221 lb (100.2 kg)     GEN: Well nourished, well developed in no acute distress HEENT: Normal NECK: No JVD LYMPHATICS: No lymphadenopathy CARDIAC: RRR, no murmurs, rubs, gallops RESPIRATORY:  Clear to auscultation without rales, wheezing or rhonchi  ABDOMEN: Soft, non-tender, non-distended MUSCULOSKELETAL:  No edema; No deformity  SKIN: Warm and dry. 1+ bilateral pre tibial edema. Right groin with ecchymosis but no hematoma NEUROLOGIC:  Alert and oriented x 3 PSYCHIATRIC:  Normal affect   ASSESSMENT:    1. 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 Chronic combined systolic and diastolic heart failure ([place])   3. Coronary artery disease involving native coronary artery of native heart without angina pectoris   4. Essential hypertension   5. Frequent PVCs    PLAN:    In order of problems listed above:  Severe AS s/p VIV TAVR: doing well 1 week out from TAVR. A little depressed and appetite poor. Groin sites healing well. ECG with no HAVB. Continue on aspirin. Patient understands he must continue lifelong SBE prophylaxis. I will see him back for 1 month follow up and echo.   Chronic combined S/D CHF: he has had brisk weight loss: 212--> 197 lbs today. I think this is from a combination of diuresis and poor appetite. Encouraged good nutrition. Will check a BMET today and adjust lasix  accordingly. Continue to hold [facility] for now. Continue Coreg 6.67m BID and Lasix 43mdaily.    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 Continue ASA 8141mD, Lipitor 76m68m, carvedilol 6.25mg43mHTN: Stable with no changes needed today.    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 TayloLovena Le  Cardiac Rehabilitation Eligibility Assessment  The patient is ready to start cardiac rehabilitation from a cardiac standpoint.    Medication Adjustments/Labs and Tests Ordered: Current medicines are reviewed at length with the patient today.  Concerns regarding medicines are outlined above.  Orders Placed This Encounter  Procedures   Basic metabolic panel   EKG 12-Le03-TDHR orders of the defined types were placed in this encounter.   Patient Instructions  Medication Instructions:  Your physician recommends that you continue on your current medications as directed. Please refer to the Current Medication list given to you today.    *If you need a refill on your cardiac medications before your next appointment, please call your pharmacy*   Lab Work: Bmp- today  If you have labs (blood work) drawn today and  your tests are completely normal, you will receive your results only by: MyChart Message (if you have MyChart) OR A paper copy in the mail If you have any lab test that is abnormal or we need to change your treatment, we will call you to review the results.   Testing/Procedures: None ordered    Follow-Up: Follow up as scheduled    Other Instructions   Important Information About Sugar         Signed, Angelena Form, PA-C  10/09/2021 11:47 AM    [place]

## 2021-10-08 NOTE — Patient Instructions (Addendum)
Medication Instructions:  Your physician recommends that you continue on your current medications as directed. Please refer to the Current Medication list given to you today.    *If you need a refill on your cardiac medications before your next appointment, please call your pharmacy*   Lab Work: Bmp- today  If you have labs (blood work) drawn today and your tests are completely normal, you will receive your results only by: [place] (if you have MyChart) OR A paper copy in the mail If you have any lab test that is abnormal or we need to change your treatment, we will call you to review the results.   Testing/Procedures: None ordered    Follow-Up: Follow up as scheduled    Other Instructions   Important Information About Sugar

## 2021-10-09 ENCOUNTER — Telehealth: Payer: Self-pay

## 2021-10-09 DIAGNOSIS — I5031 Acute diastolic (congestive) heart failure: Secondary | ICD-10-CM

## 2021-10-09 DIAGNOSIS — Z79899 Other long term (current) drug therapy: Secondary | ICD-10-CM

## 2021-10-09 DIAGNOSIS — I10 Essential (primary) hypertension: Secondary | ICD-10-CM

## 2021-10-09 LAB — BASIC METABOLIC PANEL
BUN/Creatinine Ratio: 28 — ABNORMAL HIGH (ref 10–24)
BUN: 42 mg/dL — ABNORMAL HIGH (ref 8–27)
CO2: 21 mmol/L (ref 20–29)
Calcium: 9.4 mg/dL (ref 8.6–10.2)
Chloride: 98 mmol/L (ref 96–106)
Creatinine, Ser: 1.49 mg/dL — ABNORMAL HIGH (ref 0.76–1.27)
Glucose: 115 mg/dL — ABNORMAL HIGH (ref 70–99)
Potassium: 4.9 mmol/L (ref 3.5–5.2)
Sodium: 138 mmol/L (ref 134–144)
eGFR: 47 mL/min/{1.73_m2} — ABNORMAL LOW (ref >59)

## 2021-10-09 MED ORDER — FUROSEMIDE 20 MG PO TABS: 20.0000 mg | ORAL_TABLET | Freq: Every day | ORAL | Status: DC

## 2021-10-09 NOTE — Telephone Encounter (Signed)
Spoke with patient and discussed lab results.  Per Angelena Form, PA-C: Kidney function a little more elevated than previous. Lets have him go down to 20 mg lasix daily and stop potassium supplementation. I would like to recheck a BMET again in 1 week.    Patient verbalized understanding of the above. Lab appt made for 9/5 per patient request.  Medication list updated.   Patient asked if he could have his BNP rechecked. Will forward to Angelena Form, PA-C to review and advise.

## 2021-10-09 NOTE — Telephone Encounter (Signed)
BNP added per Angelena Form, PA-C.

## 2021-10-09 NOTE — Addendum Note (Signed)
Addended by: Molli Barrows on: 10/09/2021 09:50 AM   Modules accepted: Orders

## 2021-10-13 DIAGNOSIS — R310 Gross hematuria: Secondary | ICD-10-CM | POA: Diagnosis not present

## 2021-10-13 DIAGNOSIS — R3914 Feeling of incomplete bladder emptying: Secondary | ICD-10-CM | POA: Diagnosis not present

## 2021-10-15 ENCOUNTER — Telehealth (HOSPITAL_COMMUNITY): Payer: Self-pay | Admitting: Family Medicine

## 2021-10-21 ENCOUNTER — Ambulatory Visit: Payer: Medicare Other | Attending: Physician Assistant

## 2021-10-21 DIAGNOSIS — I5031 Acute diastolic (congestive) heart failure: Secondary | ICD-10-CM

## 2021-10-21 DIAGNOSIS — Z79899 Other long term (current) drug therapy: Secondary | ICD-10-CM

## 2021-10-21 DIAGNOSIS — I10 Essential (primary) hypertension: Secondary | ICD-10-CM

## 2021-10-22 ENCOUNTER — Telehealth (HOSPITAL_COMMUNITY): Payer: Self-pay | Admitting: *Deleted

## 2021-10-22 ENCOUNTER — Encounter: Payer: Self-pay | Admitting: Internal Medicine

## 2021-10-22 LAB — BASIC METABOLIC PANEL
BUN/Creatinine Ratio: 22 (ref 10–24)
BUN: 29 mg/dL — ABNORMAL HIGH (ref 8–27)
CO2: 22 mmol/L (ref 20–29)
Calcium: 9.4 mg/dL (ref 8.6–10.2)
Chloride: 101 mmol/L (ref 96–106)
Creatinine, Ser: 1.3 mg/dL — ABNORMAL HIGH (ref 0.76–1.27)
Glucose: 96 mg/dL (ref 70–99)
Potassium: 4.1 mmol/L (ref 3.5–5.2)
Sodium: 141 mmol/L (ref 134–144)
eGFR: 56 mL/min/{1.73_m2} — ABNORMAL LOW (ref >59)

## 2021-10-22 LAB — PRO B NATRIURETIC PEPTIDE: NT-Pro BNP: 2114 pg/mL — ABNORMAL HIGH (ref 0–486)

## 2021-10-22 NOTE — Telephone Encounter (Signed)
Discussed with pt his health history, attire for rehab, and parking. Voiced understanding.  Yves Dill BS, ACSM-CEP 10/22/2021 3:13 PM

## 2021-10-23 ENCOUNTER — Encounter (HOSPITAL_COMMUNITY): Payer: Self-pay

## 2021-10-23 ENCOUNTER — Encounter (HOSPITAL_COMMUNITY)
Admission: RE | Admit: 2021-10-23 | Discharge: 2021-10-23 | Disposition: A | Discharge status: Home / Self Care | Payer: Medicare Other | Source: Ambulatory Visit | Attending: Internal Medicine | Admitting: Internal Medicine

## 2021-10-23 VITALS — BP 118/50 | HR 61 | Ht 69.0 in | Wt 196.4 lb

## 2021-10-23 DIAGNOSIS — I10 Essential (primary) hypertension: Secondary | ICD-10-CM | POA: Diagnosis not present

## 2021-10-23 DIAGNOSIS — Z48812 Encounter for surgical aftercare following surgery on the circulatory system: Secondary | ICD-10-CM | POA: Insufficient documentation

## 2021-10-23 DIAGNOSIS — Z951 Presence of aortocoronary bypass graft: Secondary | ICD-10-CM | POA: Insufficient documentation

## 2021-10-23 DIAGNOSIS — E785 Hyperlipidemia, unspecified: Secondary | ICD-10-CM | POA: Insufficient documentation

## 2021-10-23 DIAGNOSIS — Z952 Presence of prosthetic heart valve: Secondary | ICD-10-CM | POA: Diagnosis not present

## 2021-10-23 DIAGNOSIS — I350 Nonrheumatic aortic (valve) stenosis: Secondary | ICD-10-CM | POA: Diagnosis not present

## 2021-10-23 NOTE — Progress Notes (Signed)
Cardiac Rehab Medication Review by a Nurse  Does the patient  feel that his/her medications are working for him/her?  yes  Has the patient been experiencing any side effects to the medications prescribed?  no  Does the patient measure his/her own blood pressure or blood glucose at home?  no   Does the patient have any problems obtaining medications due to transportation or finances?   no  Understanding of regimen: good Understanding of indications: good Potential of compliance: good    Nurse comments: Jishnu is taking his medications as prescribed and has a good understanding of what his medications are for. Nickalous has a BP cuff/ monitor he does not check it every day.    Christa See [facility] RN 10/23/2021 10:19 AM

## 2021-10-23 NOTE — Progress Notes (Signed)
Cardiac Individual Treatment Plan  Patient Details  Name: Luis Herrera MRN: [id number] Date of Birth: [birth year] Referring Provider:   Flowsheet Row INTENSIVE CARDIAC REHAB ORIENT from 10/23/2021 in [facility]  Referring Provider Dr. Lenna Sciara, MD       Initial Encounter Date:  [facility] from 10/23/2021 in [facility]  Date 10/23/21       Visit Diagnosis: 09/30/21 S/P TAVR   Patient's Home Medications on Admission:  Current Outpatient Medications:    acetaminophen (TYLENOL) 500 MG tablet, Take 1,500 mg by mouth every 8 (eight) hours as needed for moderate pain., Disp: , Rfl:    amiodarone (PACERONE) 200 MG tablet, TAKE ONE-HALF TABLET BY MOUTH  DAILY, Disp: 45 tablet, Rfl: 3   amLODipine (NORVASC) 5 MG tablet, Take 1 tablet (5 mg total) by mouth daily., Disp: 90 tablet, Rfl: 3   aspirin EC 81 MG tablet, Take 1 tablet (81 mg total) by mouth daily. Swallow whole., Disp: 90 tablet, Rfl: 3   atorvastatin (LIPITOR) 80 MG tablet, TAKE 1 TABLET BY MOUTH ONCE  DAILY, Disp: 90 tablet, Rfl: 3   carvedilol (COREG) 6.25 MG tablet, TAKE 1 TABLET BY MOUTH  TWICE DAILY, Disp: 120 tablet, Rfl: 5   ezetimibe (ZETIA) 10 MG tablet, TAKE 1 TABLET BY MOUTH DAILY, Disp: 90 tablet, Rfl: 3   furosemide (LASIX) 20 MG tablet, Take 1 tablet (20 mg total) by mouth daily., Disp: , Rfl:    levothyroxine (SYNTHROID) 50 MCG tablet, TAKE 1 TABLET BY MOUTH DAILY  BEFORE BREAKFAST, Disp: 90 tablet, Rfl: 3   Multiple Vitamins-Minerals (PRESERVISION AREDS 2) CAPS, Take 1 capsule by mouth 2 (two) times daily., Disp: , Rfl:    tamsulosin (FLOMAX) 0.4 MG CAPS capsule, Take 0.4 mg by mouth daily., Disp: , Rfl:   Past Medical History: Past Medical History:  Diagnosis Date   Aortic stenosis 2008   Arthritis    Blood transfusion    Blood transfusion without reported diagnosis    CAD (coronary artery disease) 2008    Cataract    Complication of anesthesia    hallucinated once   H/O hiatal hernia    Heart murmur    AFTER REPLACED VALVE WENT AWAY   Hx of colonic polyps    Hyperlipidemia    Hypertension    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 TAli Loweand Dr. BCyndia Bent  Thyroid disease     Tobacco Use: Social History   Tobacco Use  Smoking Status Former   Types: Cigarettes   Quit date: 05/25/1973   Years since quitting: 48.4  Smokeless Tobacco Never    Labs: Review Flowsheet  More data exists      Latest Ref Rng & Units 12/13/2018 12/18/2019 09/24/2021 09/29/2021 09/30/2021  Labs for ITP Cardiac and Pulmonary Rehab  Cholestrol 100 - 199 mg/dL 144  150  - - -  LDL (calc) 0 - 99 mg/dL 73  78  - - -  HDL-C >39 mg/dL 58  54  - - -  Trlycerides 0 - 149 mg/dL 64  96  - - -  Hemoglobin A1c 4.8 - 5.6 % - - - 5.4  -  PH, Arterial 7.35 - 7.45 - - 7.428  7.5  -  PCO2 arterial 32 - 48 mmHg - - 36.3  41  -  Bicarbonate 20.0 - 28.0 mmol/L - -  25.0  26.0  24.0  32.0  -  TCO2 22 - 32 mmol/L - - '26  27  25  '$ - 26  22   O2 Saturation % - - 68  70  92  98.3  -    Capillary Blood Glucose: No results found for: "GLUCAP"   Exercise Target Goals: Exercise Program Goal: Individual exercise prescription set using results from initial 6 min walk test and THRR while considering  patient's activity barriers and safety.   Exercise Prescription Goal: Initial exercise prescription builds to 30-45 minutes a day of aerobic activity, 2-3 days per week.  Home exercise guidelines will be given to patient during program as part of exercise prescription that the participant will acknowledge.  Activity Barriers & Risk Stratification:  Activity Barriers & Cardiac Risk Stratification - 10/23/21 1111       Activity Barriers & Cardiac Risk Stratification   Activity Barriers Left Knee Replacement;Right Knee Replacement;Left Hip Replacement;Deconditioning;Joint  Problems;Balance Concerns;Decreased Ventricular Function    Cardiac Risk Stratification High             6 Minute Walk:  6 Minute Walk     Row Name 10/23/21 1106         6 Minute Walk   Phase Initial     Distance 1098 feet     Walk Time 6 minutes     # of Rest Breaks 0     MPH 2.08     METS 1.73     RPE 10     Perceived Dyspnea  0     VO2 Peak 6.06     Symptoms No     Resting HR 61 bpm     Resting BP 118/50     Resting Oxygen Saturation  98 %     Exercise Oxygen Saturation  during 6 min walk 97 %     Max Ex. HR 80 bpm     Max Ex. BP 128/52     2 Minute Post BP 148/62              Oxygen Initial Assessment:   Oxygen Re-Evaluation:   Oxygen Discharge (Final Oxygen Re-Evaluation):   Initial Exercise Prescription:  Initial Exercise Prescription - 10/23/21 1100       Date of Initial Exercise RX and Referring Provider   Date 10/23/21    Referring Provider Dr. Lenna Sciara, MD    Expected Discharge Date 12/19/21      NuStep   Level 1    SPM 75    Minutes 25    METs 1.5      Prescription Details   Frequency (times per week) 3    Duration Progress to 30 minutes of continuous aerobic without signs/symptoms of physical distress      Intensity   THRR 40-80% of Max Heartrate 56-134    Ratings of Perceived Exertion 11-13    Perceived Dyspnea 0-4      Progression   Progression Continue progressive overload as per policy without signs/symptoms or physical distress.      Resistance Training   Training Prescription Yes    Weight 3    Reps 10-15             Perform Capillary Blood Glucose checks as needed.  Exercise Prescription Changes:   Exercise Comments:   Exercise Goals and Review:   Exercise Goals     Row Name 10/23/21 1119  Exercise Goals   Increase Physical Activity Yes       Intervention Provide advice, education, support and counseling about physical activity/exercise needs.;Develop an individualized  exercise prescription for aerobic and resistive training based on initial evaluation findings, risk stratification, comorbidities and participant's personal goals.       Expected Outcomes Short Term: Attend rehab on a regular basis to increase amount of physical activity.;Long Term: Add in home exercise to make exercise part of routine and to increase amount of physical activity.;Long Term: Exercising regularly at least 3-5 days a week.       Increase Strength and Stamina Yes       Intervention Provide advice, education, support and counseling about physical activity/exercise needs.;Develop an individualized exercise prescription for aerobic and resistive training based on initial evaluation findings, risk stratification, comorbidities and participant's personal goals.       Expected Outcomes Short Term: Increase workloads from initial exercise prescription for resistance, speed, and METs.;Short Term: Perform resistance training exercises routinely during rehab and add in resistance training at home;Long Term: Improve cardiorespiratory fitness, muscular endurance and strength as measured by increased METs and functional capacity (6MWT)       Able to understand and use rate of perceived exertion (RPE) scale Yes       Intervention Provide education and explanation on how to use RPE scale       Expected Outcomes Short Term: Able to use RPE daily in rehab to express subjective intensity level;Long Term:  Able to use RPE to guide intensity level when exercising independently       Knowledge and understanding of Target Heart Rate Range (THRR) Yes       Intervention Provide education and explanation of THRR including how the numbers were predicted and where they are located for reference       Expected Outcomes Short Term: Able to state/look up THRR;Long Term: Able to use THRR to govern intensity when exercising independently;Short Term: Able to use daily as guideline for intensity in rehab       Understanding  of Exercise Prescription Yes       Intervention Provide education, explanation, and written materials on patient's individual exercise prescription       Expected Outcomes Short Term: Able to explain program exercise prescription;Long Term: Able to explain home exercise prescription to exercise independently                Exercise Goals Re-Evaluation :   Discharge Exercise Prescription (Final Exercise Prescription Changes):   Nutrition:  Target Goals: Understanding of nutrition guidelines, daily intake of sodium '1500mg'$ , cholesterol '200mg'$ , calories 30% from fat and 7% or less from saturated fats, daily to have 5 or more servings of fruits and vegetables.  Biometrics:  Pre Biometrics - 10/23/21 1120       Pre Biometrics   Waist Circumference 39.5 inches    Hip Circumference 40 inches    Waist to Hip Ratio 0.99 %    Triceps Skinfold 16 mm    % Body Fat 28.4 %    Grip Strength 32 kg    Flexibility --   pt unable to reach   Single Leg Stand 4.2 seconds              Nutrition Therapy Plan and Nutrition Goals:   Nutrition Assessments:  MEDIFICTS Score Key: ?70 Need to make dietary changes  40-70 Heart Healthy Diet ? 40 Therapeutic Level Cholesterol Diet    Picture Your Plate Scores: <23  Unhealthy dietary pattern with much room for improvement. 41-50 Dietary pattern unlikely to meet recommendations for good health and room for improvement. 51-60 More healthful dietary pattern, with some room for improvement.  >60 Healthy dietary pattern, although there may be some specific behaviors that could be improved.    Nutrition Goals Re-Evaluation:   Nutrition Goals Re-Evaluation:   Nutrition Goals Discharge (Final Nutrition Goals Re-Evaluation):   Psychosocial: Target Goals: Acknowledge presence or absence of significant depression and/or stress, maximize coping skills, provide positive support system. Participant is able to verbalize types and ability to use  techniques and skills needed for reducing stress and depression.  Initial Review & Psychosocial Screening:  Initial Psych Review & Screening - 10/23/21 1029       Initial Review   Current issues with None Identified      Family Dynamics   Good Support System? Yes   Eran has his wife, two children and grandchildren who live in the area for support     Barriers   Psychosocial barriers to participate in program There are no identifiable barriers or psychosocial needs.      Screening Interventions   Interventions Encouraged to exercise             Quality of Life Scores:  Quality of Life - 10/23/21 1125       Quality of Life   Select Quality of Life      Quality of Life Scores   Health/Function Pre 28 %    Socioeconomic Pre 27.6 %    Psych/Spiritual Pre 30 %    Family Pre 30 %    GLOBAL Pre 28.76 %            Scores of 19 and below usually indicate a poorer quality of life in these areas.  A difference of  2-3 points is a clinically meaningful difference.  A difference of 2-3 points in the total score of the Quality of Life Index has been associated with significant improvement in overall quality of life, self-image, physical symptoms, and general health in studies assessing change in quality of life.  PHQ-9: Review Flowsheet  More data may exist      10/23/2021 05/19/2021 05/27/2020 10/17/2019 01/12/2017  Depression screen PHQ 2/9  Decreased Interest 0 0 0 0 0  Down, Depressed, Hopeless 0 0 0 0 0  PHQ - 2 Score 0 0 0 0 0   Interpretation of Total Score  Total Score Depression Severity:  1-4 = Minimal depression, 5-9 = Mild depression, 10-14 = Moderate depression, 15-19 = Moderately severe depression, 20-27 = Severe depression   Psychosocial Evaluation and Intervention:   Psychosocial Re-Evaluation:   Psychosocial Discharge (Final Psychosocial Re-Evaluation):   Vocational Rehabilitation: Provide vocational rehab assistance to qualifying candidates.    Vocational Rehab Evaluation & Intervention:  Vocational Rehab - 10/23/21 1033       Initial Vocational Rehab Evaluation & Intervention   Assessment shows need for Vocational Rehabilitation No   Thierry ir retired and does not need vocational rehab at this time            Education: Education Goals: Education classes will be provided on a weekly basis, covering required topics. Participant will state understanding/return demonstration of topics presented.     Core Videos: Exercise    Move It!  Clinical staff conducted group or individual video education with verbal and written material and guidebook.  Patient learns the recommended Pritikin exercise program. Exercise with the goal of living  a long, healthy life. Some of the health benefits of exercise include controlled diabetes, healthier blood pressure levels, improved cholesterol levels, improved heart and lung capacity, improved sleep, and better body composition. Everyone should speak with their doctor before starting or changing an exercise routine.  Biomechanical Limitations Clinical staff conducted group or individual video education with verbal and written material and guidebook.  Patient learns how biomechanical limitations can impact exercise and how we can mitigate and possibly overcome limitations to have an impactful and balanced exercise routine.  Body Composition Clinical staff conducted group or individual video education with verbal and written material and guidebook.  Patient learns that body composition (ratio of muscle mass to fat mass) is a key component to assessing overall fitness, rather than body weight alone. Increased fat mass, especially visceral belly fat, can put [place] at increased risk for metabolic syndrome, type 2 diabetes, heart disease, and even death. It is recommended to combine diet and exercise (cardiovascular and resistance training) to improve your body composition. Seek guidance from your physician  and exercise physiologist before implementing an exercise routine.  Exercise Action Plan Clinical staff conducted group or individual video education with verbal and written material and guidebook.  Patient learns the recommended strategies to achieve and enjoy long-term exercise adherence, including variety, self-motivation, self-efficacy, and positive decision making. Benefits of exercise include fitness, good health, weight management, more energy, better sleep, less stress, and overall well-being.  Medical   Heart Disease Risk Reduction Clinical staff conducted group or individual video education with verbal and written material and guidebook.  Patient learns our heart is our most vital organ as it circulates oxygen, nutrients, white blood cells, and hormones throughout the entire body, and carries waste away. Data supports a plant-based eating plan like the Pritikin Program for its effectiveness in slowing progression of and reversing heart disease. The video provides a number of recommendations to address heart disease.   Metabolic Syndrome and Belly Fat  Clinical staff conducted group or individual video education with verbal and written material and guidebook.  Patient learns what metabolic syndrome is, how it leads to heart disease, and how one can reverse it and keep it from coming back. You have metabolic syndrome if you have 3 of the following 5 criteria: abdominal obesity, high blood pressure, high triglycerides, low HDL cholesterol, and high blood sugar.  Hypertension and Heart Disease Clinical staff conducted group or individual video education with verbal and written material and guidebook.  Patient learns that high blood pressure, or hypertension, is very common in the [place]. Hypertension is largely due to excessive salt intake, but other important risk factors include being overweight, physical inactivity, drinking too much alcohol, smoking, and not eating enough potassium  from fruits and vegetables. High blood pressure is a leading risk factor for heart attack, stroke, congestive heart failure, dementia, kidney failure, and premature death. Long-term effects of excessive salt intake include stiffening of the arteries and thickening of heart muscle and organ damage. Recommendations include ways to reduce hypertension and the risk of heart disease.  Diseases of Our Time - Focusing on Diabetes Clinical staff conducted group or individual video education with verbal and written material and guidebook.  Patient learns why the best way to stop diseases of our time is prevention, through food and other lifestyle changes. Medicine (such as prescription pills and surgeries) is often only a Band-Aid on the problem, not a long-term solution. Most common diseases of our time include obesity, type 2 diabetes,  hypertension, heart disease, and cancer. The Pritikin Program is recommended and has been proven to help reduce, reverse, and/or prevent the damaging effects of metabolic syndrome.  Nutrition   Overview of the Pritikin Eating Plan  Clinical staff conducted group or individual video education with verbal and written material and guidebook.  Patient learns about the [place] for disease risk reduction. The [place] emphasizes a wide variety of unrefined, minimally-processed carbohydrates, like fruits, vegetables, whole grains, and legumes. Go, Caution, and Stop food choices are explained. Plant-based and lean animal proteins are emphasized. Rationale provided for low sodium intake for blood pressure control, low added sugars for blood sugar stabilization, and low added fats and oils for coronary artery disease risk reduction and weight management.  Calorie Density  Clinical staff conducted group or individual video education with verbal and written material and guidebook.  Patient learns about calorie density and how it impacts the Pritikin Eating Plan.  Knowing the characteristics of the food you choose will help you decide whether those foods will lead to weight gain or weight loss, and whether you want to consume more or less of them. Weight loss is usually a side effect of the Pritikin Eating Plan because of its focus on low calorie-dense foods.  Label Reading  Clinical staff conducted group or individual video education with verbal and written material and guidebook.  Patient learns about the Pritikin recommended label reading guidelines and corresponding recommendations regarding calorie density, added sugars, sodium content, and whole grains.  Dining Out - Part 1  Clinical staff conducted group or individual video education with verbal and written material and guidebook.  Patient learns that restaurant meals can be sabotaging because they can be so high in calories, fat, sodium, and/or sugar. Patient learns recommended strategies on how to positively address this and avoid unhealthy pitfalls.  Facts on Fats  Clinical staff conducted group or individual video education with verbal and written material and guidebook.  Patient learns that lifestyle modifications can be just as effective, if not more so, as many medications for lowering your risk of heart disease. A Pritikin lifestyle can help to reduce your risk of inflammation and atherosclerosis (cholesterol build-up, or plaque, in the artery walls). Lifestyle interventions such as dietary choices and physical activity address the cause of atherosclerosis. A review of the types of fats and their impact on blood cholesterol levels, along with dietary recommendations to reduce fat intake is also included.  Nutrition Action Plan  Clinical staff conducted group or individual video education with verbal and written material and guidebook.  Patient learns how to incorporate Pritikin recommendations into their lifestyle. Recommendations include planning and keeping personal health goals in mind as an  important part of their success.  Healthy Mind-Set    Healthy Minds, Bodies, Hearts  Clinical staff conducted group or individual video education with verbal and written material and guidebook.  Patient learns how to identify when they are stressed. Video will discuss the impact of that stress, as well as the many benefits of stress management. Patient will also be introduced to stress management techniques. The way we think, act, and feel has an impact on our hearts.  How Our Thoughts Can Heal Our Hearts  Clinical staff conducted group or individual video education with verbal and written material and guidebook.  Patient learns that negative thoughts can cause depression and anxiety. This can result in negative lifestyle behavior and serious health problems. Cognitive behavioral therapy is an effective method to  help control our thoughts in order to change and improve our emotional outlook.  Additional Videos:  Exercise    Improving Performance  Clinical staff conducted group or individual video education with verbal and written material and guidebook.  Patient learns to use a non-linear approach by alternating intensity levels and lengths of time spent exercising to help burn more calories and lose more body fat. Cardiovascular exercise helps improve heart health, metabolism, hormonal balance, blood sugar control, and recovery from fatigue. Resistance training improves strength, endurance, balance, coordination, reaction time, metabolism, and muscle mass. Flexibility exercise improves circulation, posture, and balance. Seek guidance from your physician and exercise physiologist before implementing an exercise routine and learn your capabilities and proper form for all exercise.  Introduction to Yoga  Clinical staff conducted group or individual video education with verbal and written material and guidebook.  Patient learns about yoga, a discipline of the coming together of mind, breath, and  body. The benefits of yoga include improved flexibility, improved range of motion, better posture and core strength, increased lung function, weight loss, and positive self-image. Yoga's heart health benefits include lowered blood pressure, healthier heart rate, decreased cholesterol and triglyceride levels, improved immune function, and reduced stress. Seek guidance from your physician and exercise physiologist before implementing an exercise routine and learn your capabilities and proper form for all exercise.  Medical   Aging: Enhancing Your Quality of Life  Clinical staff conducted group or individual video education with verbal and written material and guidebook.  Patient learns key strategies and recommendations to stay in good physical health and enhance quality of life, such as prevention strategies, having an advocate, securing a [place], and keeping a list of medications and system for tracking them. It also discusses how to avoid risk for bone loss.  Biology of Weight Control  Clinical staff conducted group or individual video education with verbal and written material and guidebook.  Patient learns that weight gain occurs because we consume more calories than we burn (eating more, moving less). Even if your body weight is normal, you may have higher ratios of fat compared to muscle mass. Too much body fat puts you at increased risk for cardiovascular disease, heart attack, stroke, type 2 diabetes, and obesity-related cancers. In addition to exercise, following the [place] can help reduce your risk.  Decoding Lab Results  Clinical staff conducted group or individual video education with verbal and written material and guidebook.  Patient learns that lab test reflects one measurement whose values change over time and are influenced by many factors, including medication, stress, sleep, exercise, food, hydration, pre-existing medical conditions, and  more. It is recommended to use the knowledge from this video to become more involved with your lab results and evaluate your numbers to speak with your doctor.   Diseases of Our Time - Overview  Clinical staff conducted group or individual video education with verbal and written material and guidebook.  Patient learns that according to the CDC, 50% to 70% of chronic diseases (such as obesity, type 2 diabetes, elevated lipids, hypertension, and heart disease) are avoidable through lifestyle improvements including healthier food choices, listening to satiety cues, and increased physical activity.  Sleep Disorders Clinical staff conducted group or individual video education with verbal and written material and guidebook.  Patient learns how good quality and duration of sleep are important to overall health and well-being. Patient also learns about sleep disorders and how they impact health along  with recommendations to address them, including discussing with a physician.  Nutrition  Dining Out - Part 2 Clinical staff conducted group or individual video education with verbal and written material and guidebook.  Patient learns how to plan ahead and communicate in order to maximize their dining experience in a healthy and nutritious manner. Included are recommended food choices based on the type of restaurant the patient is visiting.   Fueling a Best boy conducted group or individual video education with verbal and written material and guidebook.  There is a strong connection between our food choices and our health. Diseases like obesity and type 2 diabetes are very prevalent and are in large-part due to lifestyle choices. The Pritikin Eating Plan provides plenty of food and hunger-curbing satisfaction. It is easy to follow, affordable, and helps reduce health risks.  Menu Workshop  Clinical staff conducted group or individual video education with verbal and written material and  guidebook.  Patient learns that restaurant meals can sabotage health goals because they are often packed with calories, fat, sodium, and sugar. Recommendations include strategies to plan ahead and to communicate with the manager, chef, or server to help order a healthier meal.  Planning Your Eating Strategy  Clinical staff conducted group or individual video education with verbal and written material and guidebook.  Patient learns about the [place] and its benefit of reducing the risk of disease. The [place] does not focus on calories. Instead, it emphasizes high-quality, nutrient-rich foods. By knowing the characteristics of the foods, we choose, we can determine their calorie density and make informed decisions.  Targeting Your Nutrition Priorities  Clinical staff conducted group or individual video education with verbal and written material and guidebook.  Patient learns that lifestyle habits have a tremendous impact on disease risk and progression. This video provides eating and physical activity recommendations based on your personal health goals, such as reducing LDL cholesterol, losing weight, preventing or controlling type 2 diabetes, and reducing high blood pressure.  Vitamins and Minerals  Clinical staff conducted group or individual video education with verbal and written material and guidebook.  Patient learns different ways to obtain key vitamins and minerals, including through a recommended healthy diet. It is important to discuss all supplements you take with your doctor.   Healthy Mind-Set    Smoking Cessation  Clinical staff conducted group or individual video education with verbal and written material and guidebook.  Patient learns that cigarette smoking and tobacco addiction pose a serious health risk which affects millions of people. Stopping smoking will significantly reduce the risk of heart disease, lung disease, and many forms of cancer.  Recommended strategies for quitting are covered, including working with your doctor to develop a successful plan.  Culinary   Becoming a Financial trader conducted group or individual video education with verbal and written material and guidebook.  Patient learns that cooking at home can be healthy, cost-effective, quick, and puts them in control. Keys to cooking healthy recipes will include looking at your recipe, assessing your equipment needs, planning ahead, making it simple, choosing cost-effective seasonal ingredients, and limiting the use of added fats, salts, and sugars.  Cooking - Breakfast and Snacks  Clinical staff conducted group or individual video education with verbal and written material and guidebook.  Patient learns how important breakfast is to satiety and nutrition through the entire day. Recommendations include key foods to eat during breakfast to help stabilize blood sugar levels  and to prevent overeating at meals later in the day. Planning ahead is also a key component.  Cooking - Human resources officer conducted group or individual video education with verbal and written material and guidebook.  Patient learns eating strategies to improve overall health, including an approach to cook more at home. Recommendations include thinking of animal protein as a side on your plate rather than center stage and focusing instead on lower calorie dense options like vegetables, fruits, whole grains, and plant-based proteins, such as beans. Making sauces in large quantities to freeze for later and leaving the skin on your vegetables are also recommended to maximize your experience.  Cooking - Healthy Salads and Dressing Clinical staff conducted group or individual video education with verbal and written material and guidebook.  Patient learns that vegetables, fruits, whole grains, and legumes are the foundations of the [place]. Recommendations include how  to incorporate each of these in flavorful and healthy salads, and how to create homemade salad dressings. Proper handling of ingredients is also covered. Cooking - Soups and [employer] - Soups and Desserts Clinical staff conducted group or individual video education with verbal and written material and guidebook.  Patient learns that Pritikin soups and desserts make for easy, nutritious, and delicious snacks and meal components that are low in sodium, fat, sugar, and calorie density, while high in vitamins, minerals, and filling fiber. Recommendations include simple and healthy ideas for soups and desserts.   Overview     The Pritikin Solution Program Overview Clinical staff conducted group or individual video education with verbal and written material and guidebook.  Patient learns that the results of the [place] Program have been documented in more than 100 articles published in peer-reviewed journals, and the benefits include reducing risk factors for (and, in some cases, even reversing) high cholesterol, high blood pressure, type 2 diabetes, obesity, and more! An overview of the three key pillars of the Pritikin Program will be covered: eating well, doing regular exercise, and having a healthy mind-set.  WORKSHOPS  Exercise: Exercise Basics: Building Your Action Plan Clinical staff led group instruction and group discussion with PowerPoint presentation and patient guidebook. To enhance the learning environment the use of posters, models and videos may be added. At the conclusion of this workshop, patients will comprehend the difference between physical activity and exercise, as well as the benefits of incorporating both, into their routine. Patients will understand the FITT (Frequency, Intensity, Time, and Type) principle and how to use it to build an exercise action plan. In addition, safety concerns and other considerations for exercise and cardiac rehab will be addressed by the  presenter. The purpose of this lesson is to promote a comprehensive and effective weekly exercise routine in order to improve patients' overall level of fitness.   Managing Heart Disease: Your Path to a Healthier Heart Clinical staff led group instruction and group discussion with PowerPoint presentation and patient guidebook. To enhance the learning environment the use of posters, models and videos may be added.At the conclusion of this workshop, patients will understand the anatomy and physiology of the heart. Additionally, they will understand how Pritikin's three pillars impact the risk factors, the progression, and the management of heart disease.  The purpose of this lesson is to provide a high-level overview of the heart, heart disease, and how the Pritikin lifestyle positively impacts risk factors.  Exercise Biomechanics Clinical staff led group instruction and group discussion with PowerPoint presentation and patient  guidebook. To enhance the learning environment the use of posters, models and videos may be added. Patients will learn how the structural parts of their bodies function and how these functions impact their daily activities, movement, and exercise. Patients will learn how to promote a neutral spine, learn how to manage pain, and identify ways to improve their physical movement in order to promote healthy living. The purpose of this lesson is to expose patients to common physical limitations that impact physical activity. Participants will learn practical ways to adapt and manage aches and pains, and to minimize their effect on regular exercise. Patients will learn how to maintain good posture while sitting, walking, and lifting.  Balance Training and Fall Prevention  Clinical staff led group instruction and group discussion with PowerPoint presentation and patient guidebook. To enhance the learning environment the use of posters, models and videos may be added. At the  conclusion of this workshop, patients will understand the importance of their sensorimotor skills (vision, proprioception, and the vestibular system) in maintaining their ability to balance as they age. Patients will apply a variety of balancing exercises that are appropriate for their current level of function. Patients will understand the common causes for poor balance, possible solutions to these problems, and ways to modify their physical environment in order to minimize their fall risk. The purpose of this lesson is to teach patients about the importance of maintaining balance as they age and ways to minimize their risk of falling.  WORKSHOPS   Nutrition:  Fueling a Scientist, research (physical sciences) led group instruction and group discussion with PowerPoint presentation and patient guidebook. To enhance the learning environment the use of posters, models and videos may be added. Patients will review the foundational principles of the [place] and understand what constitutes a serving size in each of the food groups. Patients will also learn Pritikin-friendly foods that are better choices when away from home and review make-ahead meal and snack options. Calorie density will be reviewed and applied to three nutrition priorities: weight maintenance, weight loss, and weight gain. The purpose of this lesson is to reinforce (in a group setting) the key concepts around what patients are recommended to eat and how to apply these guidelines when away from home by planning and selecting Pritikin-friendly options. Patients will understand how calorie density may be adjusted for different weight management goals.  Mindful Eating  Clinical staff led group instruction and group discussion with PowerPoint presentation and patient guidebook. To enhance the learning environment the use of posters, models and videos may be added. Patients will briefly review the concepts of the [place] and the  importance of low-calorie dense foods. The concept of mindful eating will be introduced as well as the importance of paying attention to internal hunger signals. Triggers for non-hunger eating and techniques for dealing with triggers will be explored. The purpose of this lesson is to provide patients with the opportunity to review the basic principles of the [place], discuss the value of eating mindfully and how to measure internal cues of hunger and fullness using the Hunger Scale. Patients will also discuss reasons for non-hunger eating and learn strategies to use for controlling emotional eating.  Targeting Your Nutrition Priorities Clinical staff led group instruction and group discussion with PowerPoint presentation and patient guidebook. To enhance the learning environment the use of posters, models and videos may be added. Patients will learn how to determine their genetic susceptibility to disease by reviewing their  family history. Patients will gain insight into the importance of diet as part of an overall healthy lifestyle in mitigating the impact of genetics and other environmental insults. The purpose of this lesson is to provide patients with the opportunity to assess their personal nutrition priorities by looking at their family history, their own health history and current risk factors. Patients will also be able to discuss ways of prioritizing and modifying the [place] for their highest risk areas  Menu  Clinical staff led group instruction and group discussion with PowerPoint presentation and patient guidebook. To enhance the learning environment the use of posters, models and videos may be added. Using menus brought in from [employer], or printed from [employer], patients will apply the [place] dining out guidelines that were presented in the [employer] video. Patients will also be able to practice these guidelines in a variety of  provided scenarios. The purpose of this lesson is to provide patients with the opportunity to practice hands-on learning of the [place] with actual menus and practice scenarios.  Label Reading Clinical staff led group instruction and group discussion with PowerPoint presentation and patient guidebook. To enhance the learning environment the use of posters, models and videos may be added. Patients will review and discuss the Pritikin label reading guidelines presented in Pritikin's Label Reading Educational series video. Using fool labels brought in from local grocery stores and markets, patients will apply the label reading guidelines and determine if the packaged food meet the Pritikin guidelines. The purpose of this lesson is to provide patients with the opportunity to review, discuss, and practice hands-on learning of the Pritikin Label Reading guidelines with actual packaged food labels. [place] Workshops are designed to teach patients ways to prepare quick, simple, and affordable recipes at home. The importance of nutrition's role in chronic disease risk reduction is reflected in its emphasis in the overall Pritikin program. By learning how to prepare essential core Pritikin Eating Plan recipes, patients will increase control over what they eat; be able to customize the flavor of foods without the use of added salt, sugar, or fat; and improve the quality of the food they consume. By learning a set of core recipes which are easily assembled, quickly prepared, and affordable, patients are more likely to prepare more healthy foods at home. These workshops focus on convenient breakfasts, simple entres, side dishes, and desserts which can be prepared with minimal effort and are consistent with nutrition recommendations for cardiovascular risk reduction. Cooking [employer] are taught by a Engineer, materials (RD) who has been trained by the  [employer]. The chef or RD has a clear understanding of the importance of minimizing - if not completely eliminating - added fat, sugar, and sodium in recipes. Throughout the series of [place] Workshop sessions, patients will learn about healthy ingredients and efficient methods of cooking to build confidence in their capability to prepare    Cooking School weekly topics:  Adding Flavor- Sodium-Free  Fast and Healthy Breakfasts  Powerhouse Plant-Based Proteins  Satisfying Salads and Dressings  Simple Sides and Sauces  International Cuisine-Spotlight on the [place] Zones  Delicious Desserts  Savory Soups  Efficiency Cooking - Meals in a Agricultural consultant Appetizers and Snacks  Comforting Weekend Breakfasts  One-Pot Wonders   Fast Evening Meals  Contractor Your Pritikin Plate  WORKSHOPS   Healthy Mindset (Psychosocial): New Thoughts, New Behaviors  Clinical staff led group instruction and group discussion with PowerPoint presentation and patient guidebook. To enhance the learning environment the use of posters, models and videos may be added. Patients will learn and practice techniques for developing effective health and lifestyle goals. Patients will be able to effectively apply the goal setting process learned to develop at least one new personal goal.  The purpose of this lesson is to expose patients to a new skill set of behavior modification techniques such as techniques setting SMART goals, overcoming barriers, and achieving new thoughts and new behaviors.  Managing Moods and Relationships Clinical staff led group instruction and group discussion with PowerPoint presentation and patient guidebook. To enhance the learning environment the use of posters, models and videos may be added. Patients will learn how emotional and chronic stress factors can impact their health and relationships. They will learn healthy ways to manage their moods and utilize  positive coping mechanisms. In addition, ICR patients will learn ways to improve communication skills. The purpose of this lesson is to expose patients to ways of understanding how one's mood and health are intimately connected. Developing a healthy outlook can help build positive relationships and connections with others. Patients will understand the importance of utilizing effective communication skills that include actively listening and being heard. They will learn and understand the importance of the "4 Cs" and especially Connections in fostering of a Healthy Mind-Set.  Healthy Sleep for a Healthy Heart Clinical staff led group instruction and group discussion with PowerPoint presentation and patient guidebook. To enhance the learning environment the use of posters, models and videos may be added. At the conclusion of this workshop, patients will be able to demonstrate knowledge of the importance of sleep to overall health, well-being, and quality of life. They will understand the symptoms of, and treatments for, common sleep disorders. Patients will also be able to identify daytime and nighttime behaviors which impact sleep, and they will be able to apply these tools to help manage sleep-related challenges. The purpose of this lesson is to provide patients with a general overview of sleep and outline the importance of quality sleep. Patients will learn about a few of the most common sleep disorders. Patients will also be introduced to the concept of "sleep hygiene," and discover ways to self-manage certain sleeping problems through simple daily behavior changes. Finally, the workshop will motivate patients by clarifying the links between quality sleep and their goals of heart-healthy living.   Recognizing and Reducing Stress Clinical staff led group instruction and group discussion with PowerPoint presentation and patient guidebook. To enhance the learning environment the use of posters, models and  videos may be added. At the conclusion of this workshop, patients will be able to understand the types of stress reactions, differentiate between acute and chronic stress, and recognize the impact that chronic stress has on their health. They will also be able to apply different coping mechanisms, such as reframing negative self-talk. Patients will have the opportunity to practice a variety of stress management techniques, such as deep abdominal breathing, progressive muscle relaxation, and/or guided imagery.  The purpose of this lesson is to educate patients on the role of stress in their lives and to provide healthy techniques for coping with it.  Learning Barriers/Preferences:  Learning Barriers/Preferences - 10/23/21 1126       Learning Barriers/Preferences   Learning Barriers Exercise Concerns   Balance concerns   Learning Preferences Written Material;Pictoral  Education Topics:  Knowledge Questionnaire Score:   Core Components/Risk Factors/Patient Goals at Admission:  Personal Goals and Risk Factors at Admission - 10/23/21 1129       Core Components/Risk Factors/Patient Goals on Admission    Weight Management Yes;Weight Maintenance    Intervention Weight Management: Develop a combined nutrition and exercise program designed to reach desired caloric intake, while maintaining appropriate intake of nutrient and fiber, sodium and fats, and appropriate energy expenditure required for the weight goal.;Weight Management: Provide education and appropriate resources to help participant work on and attain dietary goals.;Weight Management/Obesity: Establish reasonable short term and long term weight goals.    Admit Weight 196 lb 6.9 oz (89.1 kg)    Expected Outcomes Short Term: Continue to assess and modify interventions until short term weight is achieved;Long Term: Adherence to nutrition and physical activity/exercise program aimed toward attainment of established weight  goal;Weight Maintenance: Understanding of the daily nutrition guidelines, which includes 25-35% calories from fat, 7% or less cal from saturated fats, less than '200mg'$  cholesterol, less than 1.5gm of sodium, & 5 or more servings of fruits and vegetables daily;Understanding recommendations for meals to include 15-35% energy as protein, 25-35% energy from fat, 35-60% energy from carbohydrates, less than '200mg'$  of dietary cholesterol, 20-35 gm of total fiber daily;Understanding of distribution of calorie intake throughout the day with the consumption of 4-5 meals/snacks    Heart Failure Yes    Intervention Provide a combined exercise and nutrition program that is supplemented with education, support and counseling about heart failure. Directed toward relieving symptoms such as shortness of breath, decreased exercise tolerance, and extremity edema.    Expected Outcomes Improve functional capacity of life;Short term: Attendance in program 2-3 days a week with increased exercise capacity. Reported lower sodium intake. Reported increased fruit and vegetable intake. Reports medication compliance.;Short term: Daily weights obtained and reported for increase. Utilizing diuretic protocols set by physician.;Long term: Adoption of self-care skills and reduction of barriers for early signs and symptoms recognition and intervention leading to self-care maintenance.    Hypertension Yes    Intervention Provide education on lifestyle modifcations including regular physical activity/exercise, weight management, moderate sodium restriction and increased consumption of fresh fruit, vegetables, and low fat dairy, alcohol moderation, and smoking cessation.;Monitor prescription use compliance.    Expected Outcomes Short Term: Continued assessment and intervention until BP is < 140/49m HG in hypertensive participants. < 130/893mHG in hypertensive participants with diabetes, heart failure or chronic kidney disease.;Long Term:  Maintenance of blood pressure at goal levels.    Lipids Yes    Intervention Provide education and support for participant on nutrition & aerobic/resistive exercise along with prescribed medications to achieve LDL '70mg'$ , HDL >'40mg'$ .    Expected Outcomes Short Term: Participant states understanding of desired cholesterol values and is compliant with medications prescribed. Participant is following exercise prescription and nutrition guidelines.;Long Term: Cholesterol controlled with medications as prescribed, with individualized exercise RX and with personalized nutrition plan. Value goals: LDL < '70mg'$ , HDL > 40 mg.    Stress Yes    Intervention Offer individual and/or small group education and counseling on adjustment to heart disease, stress management and health-related lifestyle change. Teach and support self-help strategies.;Refer participants experiencing significant psychosocial distress to appropriate mental health specialists for further evaluation and treatment. When possible, include family members and significant others in education/counseling sessions.    Expected Outcomes Short Term: Participant demonstrates changes in health-related behavior, relaxation and other stress management skills, ability to obtain effective social  support, and compliance with psychotropic medications if prescribed.;Long Term: Emotional wellbeing is indicated by absence of clinically significant psychosocial distress or social isolation.    Personal Goal Other Yes    Personal Goal Short term: Flexibility and consult with RD Long term: maintain wt loss and stamina    Intervention Will continue to monitor pt and progress workloads as tolerated without sign or symptom    Expected Outcomes Pt will achieve his goals and gain knowledge and stamina             Core Components/Risk Factors/Patient Goals Review:    Core Components/Risk Factors/Patient Goals at Discharge (Final Review):    ITP Comments:  ITP  Comments     Row Name 10/23/21 1018           ITP Comments Dr Fransico Him MD, Medical Director, Introduction to Pritikin Education Program/ Intensive Cardiac Rehab. Initial Orientation Packet Reviewed with the Patient                Comments: Participant attended orientation for the cardiac rehabilitation program on  10/23/2021  to perform initial intake and exercise walk test. Patient introduced to the [place] education and orientation packet was reviewed. Completed 6-minute walk test, measurements, initial ITP, and exercise prescription. Vital signs stable. Telemetry-normal sinus rhythm,bundle branch block this has been previously documented asymptomatic. Harrell Gave RN BSN   Service time was from [phone] to [phone].

## 2021-10-27 ENCOUNTER — Encounter (HOSPITAL_COMMUNITY)
Admission: RE | Admit: 2021-10-27 | Discharge: 2021-10-27 | Disposition: A | Discharge status: Home / Self Care | Payer: Medicare Other | Source: Ambulatory Visit | Attending: Internal Medicine | Admitting: Internal Medicine

## 2021-10-27 DIAGNOSIS — I350 Nonrheumatic aortic (valve) stenosis: Secondary | ICD-10-CM | POA: Diagnosis not present

## 2021-10-27 DIAGNOSIS — Z951 Presence of aortocoronary bypass graft: Secondary | ICD-10-CM | POA: Diagnosis not present

## 2021-10-27 DIAGNOSIS — I10 Essential (primary) hypertension: Secondary | ICD-10-CM | POA: Diagnosis not present

## 2021-10-27 DIAGNOSIS — Z48812 Encounter for surgical aftercare following surgery on the circulatory system: Secondary | ICD-10-CM | POA: Diagnosis not present

## 2021-10-27 DIAGNOSIS — Z952 Presence of prosthetic heart valve: Secondary | ICD-10-CM | POA: Diagnosis not present

## 2021-10-27 DIAGNOSIS — E785 Hyperlipidemia, unspecified: Secondary | ICD-10-CM | POA: Diagnosis not present

## 2021-10-27 NOTE — Progress Notes (Addendum)
Daily Session Note  Patient Details  Name: Luis Herrera MRN: [id number] Date of Birth: 05/18/1940 Referring Provider:   Flowsheet Row INTENSIVE CARDIAC REHAB ORIENT from 10/23/2021 in [place]  Referring Provider Dr. Lenna Sciara, MD       Encounter Date: 10/27/2021  Check In:  Session Check In - 10/27/21 1048       Check-In   Supervising physician immediately available to respond to emergencies [facility] immediately available    Physician(s) Dr. Harriet Masson    Location [facility]    Staff Present Barnet Pall, RN, Rico Ala, MS, Exercise Physiologist;Johnny Starleen Blue, MS, Exercise Physiologist;Kaylee Rosana Hoes, MS, ACSM-CEP, Exercise Physiologist;David Makemson, MS, ACSM-CEP, CCRP, Exercise Physiologist    Virtual Visit No    Medication changes reported     No    Fall or balance concerns reported    No    Tobacco Cessation No Change    Current number of cigarettes/nicotine per day     0    Warm-up and Cool-down Not performed (comment)   Cardiac Rehab Orientation   Resistance Training Performed Yes    VAD Patient? No    PAD/SET Patient? No      Pain Assessment   Currently in Pain? No/denies    Pain Score 0-No pain    Multiple Pain Sites No             Capillary Blood Glucose: No results found for this or any previous visit (from the past 24 hour(s)).   Exercise Prescription Changes - 10/27/21 1100       Response to Exercise   Blood Pressure (Admit) 128/50    Blood Pressure (Exercise) 124/60    Blood Pressure (Exit) 126/70    Heart Rate (Admit) 65 bpm    Heart Rate (Exercise) 82 bpm    Heart Rate (Exit) 65 bpm    Rating of Perceived Exertion (Exercise) 11    Symptoms None    Comments Pt's first day in the [facility] program    Duration Progress to 30 minutes of  aerobic without signs/symptoms of physical distress    Intensity THRR unchanged      Progression   Progression Continue to progress workloads to  maintain intensity without signs/symptoms of physical distress.    Average METs 1.8      Resistance Training   Training Prescription Yes    Weight 3    Reps 10-15    Time 10 Minutes      Interval Training   Interval Training No      NuStep   Level 1    SPM 69    Minutes 25    METs 1.8             Social History   Tobacco Use  Smoking Status Former   Types: Cigarettes   Quit date: 05/25/1973   Years since quitting: 48.4  Smokeless Tobacco Never    Goals Met:  Exercise tolerated well No report of concerns or symptoms today Strength training completed today  Goals Unmet:  Not Applicable  Comments: [name] started cardiac rehab today.  Pt tolerated light exercise without difficulty. VSS, telemetry-Sinus Rhythm,Bundle Branch Block asymptomatic.  Medication list reconciled. Pt denies barriers to medicaiton compliance.  PSYCHOSOCIAL ASSESSMENT:  PHQ-0. Pt exhibits positive coping skills, hopeful outlook with supportive family. No psychosocial needs identified at this time, no psychosocial interventions necessary.    Pt enjoys spending time with his family and going to church.  Pt oriented to exercise equipment and routine.    Understanding verbalized.Harrell Gave RN BSN    Dr. Fransico Him is Medical Director for Cardiac Rehab at [facility].

## 2021-10-28 DIAGNOSIS — I739 Peripheral vascular disease, unspecified: Secondary | ICD-10-CM | POA: Diagnosis not present

## 2021-10-29 ENCOUNTER — Encounter (HOSPITAL_COMMUNITY)
Admission: RE | Admit: 2021-10-29 | Discharge: 2021-10-29 | Disposition: A | Discharge status: Home / Self Care | Payer: Medicare Other | Source: Ambulatory Visit | Attending: Internal Medicine | Admitting: Internal Medicine

## 2021-10-29 DIAGNOSIS — I10 Essential (primary) hypertension: Secondary | ICD-10-CM | POA: Diagnosis not present

## 2021-10-29 DIAGNOSIS — Z951 Presence of aortocoronary bypass graft: Secondary | ICD-10-CM | POA: Diagnosis not present

## 2021-10-29 DIAGNOSIS — Z48812 Encounter for surgical aftercare following surgery on the circulatory system: Secondary | ICD-10-CM | POA: Diagnosis not present

## 2021-10-29 DIAGNOSIS — E785 Hyperlipidemia, unspecified: Secondary | ICD-10-CM | POA: Diagnosis not present

## 2021-10-29 DIAGNOSIS — I350 Nonrheumatic aortic (valve) stenosis: Secondary | ICD-10-CM | POA: Diagnosis not present

## 2021-10-29 DIAGNOSIS — Z952 Presence of prosthetic heart valve: Secondary | ICD-10-CM | POA: Diagnosis not present

## 2021-10-30 DIAGNOSIS — Z85828 Personal history of other malignant neoplasm of skin: Secondary | ICD-10-CM | POA: Diagnosis not present

## 2021-10-30 DIAGNOSIS — L565 Disseminated superficial actinic porokeratosis (DSAP): Secondary | ICD-10-CM | POA: Diagnosis not present

## 2021-10-30 DIAGNOSIS — L814 Other melanin hyperpigmentation: Secondary | ICD-10-CM | POA: Diagnosis not present

## 2021-10-30 DIAGNOSIS — D1801 Hemangioma of skin and subcutaneous tissue: Secondary | ICD-10-CM | POA: Diagnosis not present

## 2021-10-30 DIAGNOSIS — L570 Actinic keratosis: Secondary | ICD-10-CM | POA: Diagnosis not present

## 2021-10-30 DIAGNOSIS — L821 Other seborrheic keratosis: Secondary | ICD-10-CM | POA: Diagnosis not present

## 2021-10-30 NOTE — Progress Notes (Unsigned)
HEART AND [place]                                     Cardiology Office Note:    Date:  11/04/2021   ID:  Luis Herrera, DOB 1940/03/12, MRN [id number]  PCP:  Luis Ruddy, MD  [facility] Cardiologist:  [name], MD  / Dr. Ali Lowe and Dr. Cyndia Bent, MD [facility] Electrophysiologist:  None   Referring MD: Luis Ruddy, MD   1 month s/p TAVR  History of Present Illness:    Luis Herrera is a 81 y.o. 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   Luis Herrera is followed by Dr. Harrington Challenger for his cardiology care. He underwent CABGx1 with AVR in 2009 while still living in [place]. His symptoms were similar at that time with SOB and fatigue prompting a cardiac catheterization and subsequent surgery. After moving to [place], he was followed at [facility] however transitioned to [facility] in 2015. His AVR has been followed with surveillance echocardiograms with stable gradients noted, mostly ranging in the 18-22 mmHg range. Echocardiogram from 12/2019 showed an LVEF at 55-60% with an unknown size bioprosthetic valve present in the aortic position with a mean gradient measuring 18.0 mmHg.    More recently he has been having issues with worsening SOB and fluid volume overload. He has been followed by Dr. Harrington Challenger on several occasions for Lasix adjustments. A repeat echocardiogram was obtained which showed normal LVEF and increased AV gradients with a mean at 38 mmHg and peak at 69.42mHg, and AVA by VTI at 0.87. Given this, Dr. RHarrington Challengerfelt he needed to be admitted for CHF with TAVR workup. On admission, he was treated with IV Lasix with resolution of his LE edema. He was seen by the Structural Heart team and underwent  Pre TAVR CT imaging. Given his response to Lasix, plan was to follow with Dr. TAli LoweOP 10/03/21 to further discuss pre TAVR cardiac catheterization and surgical evaluation. His home Lasix was increased to 652mdaily.    Unfortunately he required readmission for diuresis. He underwent successful TAVR with a 23 mm Edwards Sapien 3 UR via the TF  approach on 09/30/21. Post operative echo showed EF 45-50%, mild RV dysfunction, normally functioning VIV TAVR with a mean gradient of 19 mmHg and mild PVL as well as moderate MAC with mild MR/MS. His ASA was restarted. He was discharged on Lasix 408maily. Entresto held with slight bump in creat to 1.2.   At follow up he had lost another 12 lbs. Follow up labs showed creat 1.49. Lasix changed to 63m40mily and potassium stopped. Follow up BMET showed creat 1.3 and K 4.1.  Today the patient presents to clinic for follow up. Here alone. Daughter conferenced in over phone. Doing well. No CP or SOB. Has chronic mild LE edema but no orthopnea or PND. No dizziness or syncope. No blood in stool or urine. No palpitations.   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 TAli Loweand Dr. BCyndia Bent  Thyroid disease     Past Surgical History:  Procedure Laterality Date   AORTIC VALVE REPLACEMENT  2009   [place]   [place]  2009   SVG-PDA w/ AVR   CORRECTION HAMMER TOE  2011   BILATERAL    GASTROCNEMIUS RECESSION  05/28/2011   INTRAOPERATIVE TRANSTHORACIC ECHOCARDIOGRAM N/A 09/30/2021   Procedure: INTRAOPERATIVE TRANSTHORACIC ECHOCARDIOGRAM;  Surgeon: TEarly Osmond MD;  [facility];  Service: Open Heart Surgery;  Laterality: N/A;   POLYPECTOMY     RIGHT/LEFT HEART CATH AND CORONARY/GRAFT ANGIOGRAPHY N/A 09/24/2021   Procedure: RIGHT/LEFT HEART CATH AND CORONARY/GRAFT ANGIOGRAPHY;  Surgeon: TEarly Osmond MD;  [facility];  Service: Cardiovascular;  Laterality: N/A;   TONSILLECTOMY     TOTAL HIP ARTHROPLASTY Left 2000   TOTAL KNEE ARTHROPLASTY  2000   left   TOTAL KNEE ARTHROPLASTY  12/01/2011   Procedure: TOTAL KNEE ARTHROPLASTY;  Surgeon: MMauri Pole MD;  Location: [facility];  Service: Orthopedics;  Laterality: Right;   TRANSCATHETER AORTIC VALVE REPLACEMENT, TRANSFEMORAL Right 09/30/2021   Procedure: Transcatheter Aortic Valve Replacement, Transfemoral;  Surgeon: TEarly Osmond MD;  [facility];  Service: Open Heart Surgery;  Laterality: Right;   WEIL OSTEOTOMY  05/28/2011    Current Medications: Current Meds  Medication Sig   acetaminophen (TYLENOL) 500 MG tablet Take 1,500 mg by mouth every 8 (eight) hours as needed for moderate pain.   amiodarone (PACERONE) 200 MG tablet TAKE ONE-HALF TABLET BY MOUTH  DAILY   amLODipine (NORVASC) 5 MG tablet Take 1 tablet (5 mg total) by mouth daily.   aspirin EC 81 MG tablet Take 1 tablet (81 mg total) by mouth daily. Swallow whole.   atorvastatin (LIPITOR) 80 MG tablet TAKE 1 TABLET BY MOUTH ONCE  DAILY   carvedilol (COREG) 6.25 MG tablet TAKE 1 TABLET BY MOUTH  TWICE DAILY   ezetimibe (ZETIA) 10 MG tablet TAKE 1 TABLET BY MOUTH DAILY   furosemide (LASIX) 20 MG tablet Take 1 tablet (20 mg total) by mouth daily. (Patient taking differently: Take 20 mg by mouth as needed.)   levothyroxine (SYNTHROID) 50 MCG tablet TAKE 1 TABLET BY MOUTH DAILY  BEFORE BREAKFAST   Multiple  Vitamins-Minerals (PRESERVISION AREDS 2) CAPS Take 1 capsule by mouth 2 (two) times daily.   sacubitril-valsartan (ENTRESTO) 24-26 MG Take 1 tablet by mouth 2 (two) times daily.   tamsulosin (FLOMAX) 0.4 MG CAPS capsule Take 0.4 mg by mouth daily.     Allergies:   Sulfa antibiotics and Bactrim [sulfamethoxazole-trimethoprim]   Social History   Socioeconomic History   Marital status: Married    Spouse name: Not on file   Number of children: 2   Years of education: 12   Highest education level: High school graduate  Occupational History   Occupation: retired  Tobacco Use   Smoking status:  Former    Types: Cigarettes    Quit date: 05/25/1973    Years since quitting: 48.4   Smokeless tobacco: Never  Vaping Use   Vaping Use: Never used  Substance and Sexual Activity   Alcohol use: Not Currently    Comment: rare   Drug use: No   Sexual activity: Not Currently  Other Topics Concern   Not on file  Social History Narrative   Regular exercise-yes. Pt lives in [place] with his wife.   Social Determinants of Health   Financial Resource Strain: Not on file  Food Insecurity: Not on file  Transportation Needs: Not on file  Physical Activity: Not on file  Stress: Not on file  Social Connections: Not on file     Family History: The patient's family history includes Colon cancer (age of onset: 15) in his mother; Heart attack in his brother and father; Hyperlipidemia in an other family member; Hypertension in his brother, sister, and another family member. There is no history of Stroke, Esophageal cancer, Rectal cancer, or Stomach cancer.  ROS:   Please see the history of present illness.    All other systems reviewed and are negative.  EKGs/Labs/Other Studies Reviewed:    The following studies were reviewed today:  TAVR OPERATIVE NOTE     Date of Procedure:                09/30/2021   Preoperative Diagnosis:      Severe Aortic Stenosis    Postoperative  Diagnosis:    Same    Procedure:        Transcatheter Aortic Valve Replacement (ViV)- Transfemoral Approach             Edwards Sapien 3 Resilia THV (size 23 mm, model # 9755RLS, serial # [id number])              Co-Surgeons:                         Gaye Pollack, MD and Lenna Sciara, MD Anesthesiologist:                  Myrtie Soman, MD   Echocardiographer:              Eleonore Chiquito, MD   Pre-operative Echo Findings: Severe bioprosthetic aortic valve degeneration Normal left ventricular systolic function   Post-operative Echo Findings: Mild paravalvular leak Normal left ventricular systolic function   Left Heart Catheterization Findings: Left ventricular end-diastolic pressure of 25 mmHg   _____________   Echo 10/01/21:  IMPRESSIONS   1. 23 mm S3 ViV TAVR (prior 23 mm failed bioprosthesis). Vmax 2.9 m/s, MG 19 mmHG, EOA 1.79, DI 0.46. Mild paravalvular leak in the 2-3 o'clock position. The aortic valve has been repaired/replaced. Aortic valve regurgitation is mild. There is a 23 mm Sapien prosthetic (TAVR) valve present in the aortic position. Procedure Date: 09/30/2021.   2. Left ventricular ejection fraction, by estimation, is 45 to 50%. The  left ventricle has mildly decreased function. The left ventricle  demonstrates global hypokinesis. There is mild concentric left ventricular  hypertrophy. Indeterminate diastolic filling due to E-A fusion.   3. Right ventricular systolic function is mildly reduced. The right  ventricular size is normal. Tricuspid regurgitation signal is inadequate  for assessing PA pressure.   4. Left atrial size was moderately dilated.   5. The mitral valve is degenerative. Mild mitral valve regurgitation.  Mild  mitral stenosis. The mean mitral valve gradient is 6.0 mmHg with  average heart rate of 78 bpm. Moderate mitral annular calcification.   6. The inferior vena cava is dilated in size with >50% respiratory  variability, suggesting right atrial  pressure of 8 mmHg.   ________________________  Echo 10/31/21 IMPRESSIONS  1. Left ventricular ejection fraction, by estimation, is 50 to 55%. The  left ventricle has low normal function. The left ventricle demonstrates  global hypokinesis. There is moderate left ventricular hypertrophy. Left  ventricular diastolic parameters are   consistent with Grade II diastolic dysfunction (pseudonormalization).  Elevated left atrial pressure.   2. Right ventricular systolic function is normal. The right ventricular  size is mildly enlarged.   3. Left atrial size was moderately dilated.   4. The mitral valve is degenerative. Mild to moderate mitral valve  regurgitation.   5. Tricuspid valve regurgitation is mild to moderate.   6. The major aortic regurgitant jet appears to be perivalvular at 2-3  o'clock, but there may be an intravalvular component. Aortic valve  gradients are higher, probably in part due to AI-related increased stroke  volume. However, the dimensionless index  has decreased, which may signify a true reduction in the effective valve  area. The aortic valve has been repaired/replaced. Aortic valve  regurgitation is moderate. There is a Edwards valve in valve present in  the aortic position. Procedure Date:  09/30/2021. Aortic regurgitation PHT measures 354 msec. Aortic valve mean  gradient measures 28.0 mmHg. Aortic valve Vmax measures 3.64 m/s. Aortic  valve acceleration time measures 100 msec.   Comparison(s): Aortic insufficiency has worsened and aortic valve  gradients are higher. The aortic ejection jet remains relatively early  peaking (acceleration time 100 ms). Suspect the pulsed Doppler sample  volume is placed too deep in the ventricle,  which leads to underestimation of the dimensionless index and the aortic  valve area.   EKG:  EKG is NOT ordered today.    Recent Labs: 09/18/2021: B Natriuretic Peptide 2,254.9; TSH 3.605 09/29/2021: ALT 19 10/01/2021: Hemoglobin  10.2; Magnesium 2.1; Platelets 134 10/21/2021: BUN 29; Creatinine, Ser 1.30; NT-Pro BNP 2,114; Potassium 4.1; Sodium 141  Recent Lipid Panel    Component Value Date/Time   CHOL 150 12/18/2019 1005   TRIG 96 12/18/2019 1005   HDL 54 12/18/2019 1005   CHOLHDL 2.8 12/18/2019 1005   CHOLHDL 3 11/14/2015 0954   VLDL 16.8 11/14/2015 0954   LDLCALC 78 12/18/2019 1005     Risk Assessment/Calculations:       Physical Exam:    VS:  BP (!) 144/40   Pulse (!) 54   Ht _0  (1.803 m)   Wt 198 lb 12.8 oz (90.2 kg)   SpO2 97%   BMI 27.73 kg/m     Wt Readings from Last 3 Encounters:  10/31/21 198 lb 12.8 oz (90.2 kg)  10/23/21 196 lb 6.9 oz (89.1 kg)  10/08/21 197 lb (89.4 kg)     GEN: Well nourished, well developed in no acute distress HEENT: Normal NECK: No JVD LYMPHATICS: No lymphadenopathy CARDIAC: RRR, no murmurs, rubs, gallops RESPIRATORY:  Clear to auscultation without rales, wheezing or rhonchi  ABDOMEN: Soft, non-tender, non-distended MUSCULOSKELETAL:  Mild bilateral LE edema; No deformity  SKIN: Warm and dry.  NEUROLOGIC:  Alert and oriented x 3 PSYCHIATRIC:  Normal affect   ASSESSMENT:    1. 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 Chronic combined systolic and  diastolic heart failure ([place])   3. Coronary artery disease involving native coronary artery of native heart without angina pectoris   4. Essential hypertension   5. Frequent PVCs     PLAN:    In order of problems listed above:  Severe AS s/p VIV TAVR: echo today shows EF 50-55%, s/p TAVR with a mean gradient of 28 mm hg (up from ~42m hg and worsening PVL. Discussed case with the team and plan to do cardiac CT to r/o thrombus. We feel the increase in gradients are most likely 2/2 increased flow from AI. Fortunately, he has had a big clinical improvement with NYHA class I symptoms. Will continue clinical surveillance if CT does not show thrombus. Continue on aspirin. Patient  understands he must continue lifelong SBE prophylaxis.  Chronic combined S/D CHF: EF 50% today. Currently on Coreg 6.255mBID and Lasix 2075maily. Entresto had been on hold. Plan to make Lasix PRN and restart Entresto at lowest dose. BMET in 2 weeks.    CAD s/p CABG with SVG to PDA 2009: denies anginal symptoms. Continue ASA 22m30m, Lipitor 80mg25m carvedilol 6.25mg.36mTN: stable with no changes needed today.    PVCs: controlled on Amiodarone, follows with Dr. TaylorLovena Ledication Adjustments/Labs and Tests Ordered: Current medicines are reviewed at length with the patient today.  Concerns regarding medicines are outlined above.  Orders Placed This Encounter  Procedures   Basic metabolic panel   ECHOCARDIOGRAM COMPLETE   Meds ordered this encounter  Medications   sacubitril-valsartan (ENTRESTO) 24-26 MG    Sig: Take 1 tablet by mouth 2 (two) times daily.    Dispense:  60 tablet    Refill:  11    Patient Instructions  Medication Instructions:  Start Entresto 24-26 MG twice a day  Decrease Lasix to as needed   *If you need a refill on your cardiac medications before your next appointment, please call your pharmacy*   Lab Work: Your physician recommends that you return for lab work on Friday, September 29. Lab is open from 7:30 am to 4:30 pm   If you have labs (blood work) drawn today and your tests are completely normal, you will receive your results only by: [facility] have MyChart) OR A paper copy in the mail If you have any lab test that is abnormal or we need to change your treatment, we will call you to review the results.   Testing/Procedures: None ordered    Follow-Up: Follow up as scheduled   Other Instructions   Important Information About Sugar         Signed, KathryAngelena Form  11/04/2021 8:54 AM    [facility]

## 2021-10-31 ENCOUNTER — Ambulatory Visit (HOSPITAL_COMMUNITY): Payer: Medicare Other | Attending: Physician Assistant | Admitting: Physician Assistant

## 2021-10-31 ENCOUNTER — Ambulatory Visit (HOSPITAL_BASED_OUTPATIENT_CLINIC_OR_DEPARTMENT_OTHER): Payer: Medicare Other

## 2021-10-31 VITALS — BP 144/40 | HR 54 | Ht 71.0 in | Wt 198.8 lb

## 2021-10-31 DIAGNOSIS — Z952 Presence of prosthetic heart valve: Secondary | ICD-10-CM

## 2021-10-31 DIAGNOSIS — I5042 Chronic combined systolic (congestive) and diastolic (congestive) heart failure: Secondary | ICD-10-CM

## 2021-10-31 DIAGNOSIS — I10 Essential (primary) hypertension: Secondary | ICD-10-CM

## 2021-10-31 DIAGNOSIS — I2510 Atherosclerotic heart disease of native coronary artery without angina pectoris: Secondary | ICD-10-CM | POA: Diagnosis not present

## 2021-10-31 DIAGNOSIS — I493 Ventricular premature depolarization: Secondary | ICD-10-CM

## 2021-10-31 LAB — ECHOCARDIOGRAM COMPLETE
AR max vel: 0.91 cm2
AV Area VTI: 1.05 cm2
AV Area mean vel: 0.98 cm2
AV Mean grad: 28 mmHg
AV Peak grad: 52.9 mmHg
Ao pk vel: 3.64 m/s
Area-P 1/2: 2.36 cm2
P 1/2 time: 354 msec
S' Lateral: 3.3 cm

## 2021-10-31 MED ORDER — ENTRESTO 24-26 MG PO TABS: 1.0000 | ORAL_TABLET | Freq: Two times a day (BID) | ORAL | 11 refills | Status: DC

## 2021-10-31 NOTE — Patient Instructions (Addendum)
Medication Instructions:  Start Entresto 24-26 MG twice a day  Decrease Lasix to as needed   *If you need a refill on your cardiac medications before your next appointment, please call your pharmacy*   Lab Work: Your physician recommends that you return for lab work on Friday, September 29. Lab is open from 7:30 am to 4:30 pm   If you have labs (blood work) drawn today and your tests are completely normal, you will receive your results only by: [place] (if you have MyChart) OR A paper copy in the mail If you have any lab test that is abnormal or we need to change your treatment, we will call you to review the results.   Testing/Procedures: None ordered    Follow-Up: Follow up as scheduled   Other Instructions   Important Information About Sugar

## 2021-11-03 ENCOUNTER — Encounter (HOSPITAL_COMMUNITY)
Admission: RE | Admit: 2021-11-03 | Discharge: 2021-11-03 | Disposition: A | Discharge status: Home / Self Care | Payer: Medicare Other | Source: Ambulatory Visit | Attending: Internal Medicine | Admitting: Internal Medicine

## 2021-11-03 DIAGNOSIS — I10 Essential (primary) hypertension: Secondary | ICD-10-CM | POA: Diagnosis not present

## 2021-11-03 DIAGNOSIS — Z952 Presence of prosthetic heart valve: Secondary | ICD-10-CM

## 2021-11-03 DIAGNOSIS — E785 Hyperlipidemia, unspecified: Secondary | ICD-10-CM | POA: Diagnosis not present

## 2021-11-03 DIAGNOSIS — Z951 Presence of aortocoronary bypass graft: Secondary | ICD-10-CM | POA: Diagnosis not present

## 2021-11-03 DIAGNOSIS — I350 Nonrheumatic aortic (valve) stenosis: Secondary | ICD-10-CM | POA: Diagnosis not present

## 2021-11-03 DIAGNOSIS — Z48812 Encounter for surgical aftercare following surgery on the circulatory system: Secondary | ICD-10-CM | POA: Diagnosis not present

## 2021-11-03 NOTE — Progress Notes (Signed)
Medication changes noted. Will continue to monitor the patient throughout  the program.Rmani Kellogg Walden Delainie Chavana RN BSN  

## 2021-11-04 ENCOUNTER — Other Ambulatory Visit: Payer: Self-pay | Admitting: Physician Assistant

## 2021-11-04 ENCOUNTER — Ambulatory Visit: Payer: Self-pay | Admitting: *Deleted

## 2021-11-04 ENCOUNTER — Encounter: Payer: Self-pay | Admitting: *Deleted

## 2021-11-04 DIAGNOSIS — Z952 Presence of prosthetic heart valve: Secondary | ICD-10-CM

## 2021-11-04 NOTE — Patient Outreach (Signed)
  Care Coordination   Initial Visit Note   11/04/2021 Name: Luis Herrera MRN: [id number] DOB: 1940-09-26  Luis Herrera is a 81 y.o. year old male who sees Luis Ruddy, MD for primary care. I spoke with  Luis Herrera by phone today.  What matters to the patients health and wellness today?  No needs    Goals Addressed               This Visit's Progress     COMPLETED: No needs at this time (pt-stated)        Care Coordination Interventions: Basic overview and discussion of pathophysiology of Heart Failure reviewed Discussed importance of daily weight and advised patient to weigh and record daily Reviewed role of diuretics in prevention of fluid overload and management of heart failure; Discussed the importance of keeping all appointments with provider Provided patient with education about the role of exercise in the management of heart failure Screening for signs and symptoms of depression related to chronic disease state  Assessed social determinant of health barriers  Pt declined case management services at this time due to cardiac rehab involvement and ongoing education on his visits.           SDOH assessments and interventions completed:  Yes  SDOH Interventions Today    Flowsheet Row Most Recent Value  SDOH Interventions   Food Insecurity Interventions Intervention Not Indicated  Housing Interventions Intervention Not Indicated  Transportation Interventions Intervention Not Indicated  Utilities Interventions Intervention Not Indicated        Care Coordination Interventions Activated:  Yes  Care Coordination Interventions:  Yes, provided   Follow up plan: No further intervention required.   Encounter Outcome:  Pt. Visit Completed   Raina Mina, RN Care Management Coordinator [place] Office [phone]

## 2021-11-04 NOTE — Progress Notes (Signed)
Cardiac Individual Treatment Plan  Patient Details  Name: Luis Herrera MRN: [id number] Date of Birth: [birth year] Referring Provider:   Flowsheet Row INTENSIVE CARDIAC REHAB ORIENT from 10/23/2021 in [place]  Referring Provider Dr. Lenna Sciara, MD       Initial Encounter Date:  [facility] from 10/23/2021 in [place]  Date 10/23/21       Visit Diagnosis: 09/30/21 S/P TAVR   Patient's Home Medications on Admission:  Current Outpatient Medications:    acetaminophen (TYLENOL) 500 MG tablet, Take 1,500 mg by mouth every 8 (eight) hours as needed for moderate pain., Disp: , Rfl:    amiodarone (PACERONE) 200 MG tablet, TAKE ONE-HALF TABLET BY MOUTH  DAILY, Disp: 45 tablet, Rfl: 3   amLODipine (NORVASC) 5 MG tablet, Take 1 tablet (5 mg total) by mouth daily., Disp: 90 tablet, Rfl: 3   aspirin EC 81 MG tablet, Take 1 tablet (81 mg total) by mouth daily. Swallow whole., Disp: 90 tablet, Rfl: 3   atorvastatin (LIPITOR) 80 MG tablet, TAKE 1 TABLET BY MOUTH ONCE  DAILY, Disp: 90 tablet, Rfl: 3   carvedilol (COREG) 6.25 MG tablet, TAKE 1 TABLET BY MOUTH  TWICE DAILY, Disp: 120 tablet, Rfl: 5   ezetimibe (ZETIA) 10 MG tablet, TAKE 1 TABLET BY MOUTH DAILY, Disp: 90 tablet, Rfl: 3   furosemide (LASIX) 20 MG tablet, Take 1 tablet (20 mg total) by mouth daily. (Patient taking differently: Take 20 mg by mouth as needed.), Disp: , Rfl:    levothyroxine (SYNTHROID) 50 MCG tablet, TAKE 1 TABLET BY MOUTH DAILY  BEFORE BREAKFAST, Disp: 90 tablet, Rfl: 3   Multiple Vitamins-Minerals (PRESERVISION AREDS 2) CAPS, Take 1 capsule by mouth 2 (two) times daily., Disp: , Rfl:    sacubitril-valsartan (ENTRESTO) 24-26 MG, Take 1 tablet by mouth 2 (two) times daily., Disp: 60 tablet, Rfl: 11   tamsulosin (FLOMAX) 0.4 MG CAPS capsule, Take 0.4 mg by mouth daily., Disp: , Rfl:   Past Medical History: Past Medical  History:  Diagnosis Date   Aortic stenosis 2008   Arthritis    Blood transfusion    Blood transfusion without reported diagnosis    CAD (coronary artery disease) 2008   Cataract    Complication of anesthesia    hallucinated once   H/O hiatal hernia    Heart murmur    AFTER REPLACED VALVE WENT AWAY   Hx of colonic polyps    Hyperlipidemia    Hypertension    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 TAli Loweand Dr. BCyndia Bent  Thyroid disease     Tobacco Use: Social History   Tobacco Use  Smoking Status Former   Types: Cigarettes   Quit date: 05/25/1973   Years since quitting: 48.4  Smokeless Tobacco Never    Labs: Review Flowsheet  More data exists      Latest Ref Rng & Units 12/13/2018 12/18/2019 09/24/2021 09/29/2021 09/30/2021  Labs for ITP Cardiac and Pulmonary Rehab  Cholestrol 100 - 199 mg/dL 144  150  - - -  LDL (calc) 0 - 99 mg/dL 73  78  - - -  HDL-C >39 mg/dL 58  54  - - -  Trlycerides 0 - 149 mg/dL 64  96  - - -  Hemoglobin A1c 4.8 - 5.6 % - - - 5.4  -  PH, Arterial 7.35 -  7.45 - - 7.428  7.5  -  PCO2 arterial 32 - 48 mmHg - - 36.3  41  -  Bicarbonate 20.0 - 28.0 mmol/L - - 25.0  26.0  24.0  32.0  -  TCO2 22 - 32 mmol/L - - '26  27  25  '$ - 26  22   O2 Saturation % - - 68  70  92  98.3  -    Capillary Blood Glucose: No results found for: "GLUCAP"   Exercise Target Goals: Exercise Program Goal: Individual exercise prescription set using results from initial 6 min walk test and THRR while considering  patient's activity barriers and safety.   Exercise Prescription Goal: Initial exercise prescription builds to 30-45 minutes a day of aerobic activity, 2-3 days per week.  Home exercise guidelines will be given to patient during program as part of exercise prescription that the participant will acknowledge.  Activity Barriers & Risk Stratification:  Activity Barriers & Cardiac Risk Stratification - 10/23/21  1111       Activity Barriers & Cardiac Risk Stratification   Activity Barriers Left Knee Replacement;Right Knee Replacement;Left Hip Replacement;Deconditioning;Joint Problems;Balance Concerns;Decreased Ventricular Function    Cardiac Risk Stratification High             6 Minute Walk:  6 Minute Walk     Row Name 10/23/21 1106         6 Minute Walk   Phase Initial     Distance 1098 feet     Walk Time 6 minutes     # of Rest Breaks 0     MPH 2.08     METS 1.73     RPE 10     Perceived Dyspnea  0     VO2 Peak 6.06     Symptoms No     Resting HR 61 bpm     Resting BP 118/50     Resting Oxygen Saturation  98 %     Exercise Oxygen Saturation  during 6 min walk 97 %     Max Ex. HR 80 bpm     Max Ex. BP 128/52     2 Minute Post BP 148/62              Oxygen Initial Assessment:   Oxygen Re-Evaluation:   Oxygen Discharge (Final Oxygen Re-Evaluation):   Initial Exercise Prescription:  Initial Exercise Prescription - 10/23/21 1100       Date of Initial Exercise RX and Referring Provider   Date 10/23/21    Referring Provider Dr. Lenna Sciara, MD    Expected Discharge Date 12/19/21      NuStep   Level 1    SPM 75    Minutes 25    METs 1.5      Prescription Details   Frequency (times per week) 3    Duration Progress to 30 minutes of continuous aerobic without signs/symptoms of physical distress      Intensity   THRR 40-80% of Max Heartrate 56-134    Ratings of Perceived Exertion 11-13    Perceived Dyspnea 0-4      Progression   Progression Continue progressive overload as per policy without signs/symptoms or physical distress.      Resistance Training   Training Prescription Yes    Weight 3    Reps 10-15             Perform Capillary Blood Glucose checks as needed.  Exercise Prescription Changes:  Exercise Prescription Changes     Row Name 10/27/21 1100 11/03/21 1024           Response to Exercise   Blood Pressure (Admit)  128/50 98/72      Blood Pressure (Exercise) 124/60 118/60      Blood Pressure (Exit) 126/70 101/74      Heart Rate (Admit) 65 bpm 67 bpm      Heart Rate (Exercise) 82 bpm 87 bpm      Heart Rate (Exit) 65 bpm 70 bpm      Rating of Perceived Exertion (Exercise) 11 12      Symptoms None None      Comments Pt's first day in the CRP2 program --      Duration Progress to 30 minutes of  aerobic without signs/symptoms of physical distress Progress to 30 minutes of  aerobic without signs/symptoms of physical distress      Intensity THRR unchanged THRR unchanged        Progression   Progression Continue to progress workloads to maintain intensity without signs/symptoms of physical distress. Continue to progress workloads to maintain intensity without signs/symptoms of physical distress.      Average METs 1.8 2        Resistance Training   Training Prescription Yes Yes      Weight 3 3      Reps 10-15 10-15      Time 10 Minutes 10 Minutes        Interval Training   Interval Training No No        NuStep   Level 1 1      SPM 69 80      Minutes 25 25      METs 1.8 2               Exercise Comments:   Exercise Comments     Row Name 10/27/21 1154 11/03/21 1057         Exercise Comments Pt's first day in the CRP2 program. No complaints with todays session. Reviewed METs with patient.               Exercise Goals and Review:   Exercise Goals     Row Name 10/23/21 1119             Exercise Goals   Increase Physical Activity Yes       Intervention Provide advice, education, support and counseling about physical activity/exercise needs.;Develop an individualized exercise prescription for aerobic and resistive training based on initial evaluation findings, risk stratification, comorbidities and participant's personal goals.       Expected Outcomes Short Term: Attend rehab on a regular basis to increase amount of physical activity.;Long Term: Add in home exercise to make  exercise part of routine and to increase amount of physical activity.;Long Term: Exercising regularly at least 3-5 days a week.       Increase Strength and Stamina Yes       Intervention Provide advice, education, support and counseling about physical activity/exercise needs.;Develop an individualized exercise prescription for aerobic and resistive training based on initial evaluation findings, risk stratification, comorbidities and participant's personal goals.       Expected Outcomes Short Term: Increase workloads from initial exercise prescription for resistance, speed, and METs.;Short Term: Perform resistance training exercises routinely during rehab and add in resistance training at home;Long Term: Improve cardiorespiratory fitness, muscular endurance and strength as measured by increased METs and functional capacity (6MWT)  Able to understand and use rate of perceived exertion (RPE) scale Yes       Intervention Provide education and explanation on how to use RPE scale       Expected Outcomes Short Term: Able to use RPE daily in rehab to express subjective intensity level;Long Term:  Able to use RPE to guide intensity level when exercising independently       Knowledge and understanding of Target Heart Rate Range (THRR) Yes       Intervention Provide education and explanation of THRR including how the numbers were predicted and where they are located for reference       Expected Outcomes Short Term: Able to state/look up THRR;Long Term: Able to use THRR to govern intensity when exercising independently;Short Term: Able to use daily as guideline for intensity in rehab       Understanding of Exercise Prescription Yes       Intervention Provide education, explanation, and written materials on patient's individual exercise prescription       Expected Outcomes Short Term: Able to explain program exercise prescription;Long Term: Able to explain home exercise prescription to exercise independently                 Exercise Goals Re-Evaluation :  Exercise Goals Re-Evaluation     Row Name 10/27/21 1153             Exercise Goal Re-Evaluation   Exercise Goals Review Increase Physical Activity;Increase Strength and Stamina;Able to understand and use rate of perceived exertion (RPE) scale;Knowledge and understanding of Target Heart Rate Range (THRR);Understanding of Exercise Prescription       Comments Pt's first day in the CRP2 program. Pt understands the exercise Rx, THRR and RPE scale.       Expected Outcomes Will continue to montior patient and progress exercise workloasa as tolerated.                Discharge Exercise Prescription (Final Exercise Prescription Changes):  Exercise Prescription Changes - 11/03/21 1024       Response to Exercise   Blood Pressure (Admit) 98/72    Blood Pressure (Exercise) 118/60    Blood Pressure (Exit) 101/74    Heart Rate (Admit) 67 bpm    Heart Rate (Exercise) 87 bpm    Heart Rate (Exit) 70 bpm    Rating of Perceived Exertion (Exercise) 12    Symptoms None    Duration Progress to 30 minutes of  aerobic without signs/symptoms of physical distress    Intensity THRR unchanged      Progression   Progression Continue to progress workloads to maintain intensity without signs/symptoms of physical distress.    Average METs 2      Resistance Training   Training Prescription Yes    Weight 3    Reps 10-15    Time 10 Minutes      Interval Training   Interval Training No      NuStep   Level 1    SPM 80    Minutes 25    METs 2             Nutrition:  Target Goals: Understanding of nutrition guidelines, daily intake of sodium '1500mg'$ , cholesterol '200mg'$ , calories 30% from fat and 7% or less from saturated fats, daily to have 5 or more servings of fruits and vegetables.  Biometrics:  Pre Biometrics - 10/23/21 1120       Pre Biometrics   Waist Circumference 39.5 inches  Hip Circumference 40 inches    Waist to Hip Ratio  0.99 %    Triceps Skinfold 16 mm    % Body Fat 28.4 %    Grip Strength 32 kg    Flexibility --   pt unable to reach   Single Leg Stand 4.2 seconds              Nutrition Therapy Plan and Nutrition Goals:  Nutrition Therapy & Goals - 10/27/21 1149       Nutrition Therapy   Diet Heart Healthy Diet    Drug/Food Interactions Statins/Certain Fruits      Personal Nutrition Goals   Nutrition Goal Patient to understand strategies for reducing cardiovascular risk by attending the Pritikin education and nutrition classes    Personal Goal #2 Patient to build a healthy plate daily with variety of fruits, vegetables, whole grains, lean protein/plant protein and nonfat/lowfat dairy.    Personal Goal #3 Patient to limit to '1500mg'$  of sodium daily.    Comments Patient reports making many dietary changes including reduced sodium intake and increased high fiber foods. Patient is down ~24# since prior to his surgery (09/24/2021); his goal weight is 185#. He has good support from his wife and adult children.      Intervention Plan   Intervention Prescribe, educate and counsel regarding individualized specific dietary modifications aiming towards targeted core components such as weight, hypertension, lipid management, diabetes, heart failure and other comorbidities.;Nutrition handout(s) given to patient.    Expected Outcomes Short Term Goal: Understand basic principles of dietary content, such as calories, fat, sodium, cholesterol and nutrients.;Long Term Goal: Adherence to prescribed nutrition plan.             Nutrition Assessments:  Nutrition Assessments - 10/27/21 1419       Rate Your Plate Scores   Pre Score 64            MEDIFICTS Score Key: ?70 Need to make dietary changes  40-70 Heart Healthy Diet ? 40 Therapeutic Level Cholesterol Diet   Flowsheet Row INTENSIVE CARDIAC REHAB from 10/27/2021 in [place]  Picture Your Plate Total Score on  Admission 64      Picture Your Plate Scores: <38 Unhealthy dietary pattern with much room for improvement. 41-50 Dietary pattern unlikely to meet recommendations for good health and room for improvement. 51-60 More healthful dietary pattern, with some room for improvement.  >60 Healthy dietary pattern, although there may be some specific behaviors that could be improved.    Nutrition Goals Re-Evaluation:  Nutrition Goals Re-Evaluation     [place] Name 10/27/21 1149             Goals   Current Weight 197 lb 12 oz (89.7 kg)       Comment GFR 56, Cr 1.30, RBC 3.26, hemoglobin 10.2, HCT 32.8, A1c WNL, Lipoprotein A 229, lipid panel WNL.       Expected Outcome Goals in progress. Patient reports making many dietary changes including reduced sodium intake and increased high fiber foods. Patient is down ~24# since prior to his surgery (09/24/2021); his goal weight is 185#. He has good support from his wife and adult children.                Nutrition Goals Re-Evaluation:  Nutrition Goals Re-Evaluation     [place] Name 10/27/21 1149             Goals   Current Weight 197 lb 12  oz (89.7 kg)       Comment GFR 56, Cr 1.30, RBC 3.26, hemoglobin 10.2, HCT 32.8, A1c WNL, Lipoprotein A 229, lipid panel WNL.       Expected Outcome Goals in progress. Patient reports making many dietary changes including reduced sodium intake and increased high fiber foods. Patient is down ~24# since prior to his surgery (09/24/2021); his goal weight is 185#. He has good support from his wife and adult children.                Nutrition Goals Discharge (Final Nutrition Goals Re-Evaluation):  Nutrition Goals Re-Evaluation - 10/27/21 1149       Goals   Current Weight 197 lb 12 oz (89.7 kg)    Comment GFR 56, Cr 1.30, RBC 3.26, hemoglobin 10.2, HCT 32.8, A1c WNL, Lipoprotein A 229, lipid panel WNL.    Expected Outcome Goals in progress. Patient reports making many dietary changes including reduced sodium  intake and increased high fiber foods. Patient is down ~24# since prior to his surgery (09/24/2021); his goal weight is 185#. He has good support from his wife and adult children.             Psychosocial: Target Goals: Acknowledge presence or absence of significant depression and/or stress, maximize coping skills, provide positive support system. Participant is able to verbalize types and ability to use techniques and skills needed for reducing stress and depression.  Initial Review & Psychosocial Screening:  Initial Psych Review & Screening - 10/23/21 1029       Initial Review   Current issues with None Identified      Family Dynamics   Good Support System? Yes   Aul has his wife, two children and grandchildren who live in the area for support     Barriers   Psychosocial barriers to participate in program There are no identifiable barriers or psychosocial needs.      Screening Interventions   Interventions Encouraged to exercise             Quality of Life Scores:  Quality of Life - 10/23/21 1125       Quality of Life   Select Quality of Life      Quality of Life Scores   Health/Function Pre 28 %    Socioeconomic Pre 27.6 %    Psych/Spiritual Pre 30 %    Family Pre 30 %    GLOBAL Pre 28.76 %            Scores of 19 and below usually indicate a poorer quality of life in these areas.  A difference of  2-3 points is a clinically meaningful difference.  A difference of 2-3 points in the total score of the Quality of Life Index has been associated with significant improvement in overall quality of life, self-image, physical symptoms, and general health in studies assessing change in quality of life.  PHQ-9: Review Flowsheet  More data may exist      10/23/2021 05/19/2021 05/27/2020 10/17/2019 01/12/2017  Depression screen PHQ 2/9  Decreased Interest 0 0 0 0 0  Down, Depressed, Hopeless 0 0 0 0 0  PHQ - 2 Score 0 0 0 0 0   Interpretation of Total Score  Total  Score Depression Severity:  1-4 = Minimal depression, 5-9 = Mild depression, 10-14 = Moderate depression, 15-19 = Moderately severe depression, 20-27 = Severe depression   Psychosocial Evaluation and Intervention:   Psychosocial Re-Evaluation:  Psychosocial Re-Evaluation  [place] Name 10/28/21 6734 11/04/21 1102           Psychosocial Re-Evaluation   Current issues with None Identified None Identified      Interventions Encouraged to attend Cardiac Rehabilitation for the exercise Encouraged to attend Cardiac Rehabilitation for the exercise      Continue Psychosocial Services  No Follow up required No Follow up required               Psychosocial Discharge (Final Psychosocial Re-Evaluation):  Psychosocial Re-Evaluation - 11/04/21 1102       Psychosocial Re-Evaluation   Current issues with None Identified    Interventions Encouraged to attend Cardiac Rehabilitation for the exercise    Continue Psychosocial Services  No Follow up required             Vocational Rehabilitation: Provide vocational rehab assistance to qualifying candidates.   Vocational Rehab Evaluation & Intervention:  Vocational Rehab - 10/23/21 1033       Initial Vocational Rehab Evaluation & Intervention   Assessment shows need for Vocational Rehabilitation No   Culley ir retired and does not need vocational rehab at this time            Education: Education Goals: Education classes will be provided on a weekly basis, covering required topics. Participant will state understanding/return demonstration of topics presented.    Education     Row Name 10/27/21 1400     Education   Cardiac Education Topics Pritikin   Lexicographer Nutrition   Nutrition Calorie Density   Instruction Review Code 1- Verbalizes Understanding   Class Start Time 1140   Class Stop Time 1230   Class Time Calculation (min) 50 min    [place] Name 10/29/21 1200      Education   Cardiac Education Topics Pritikin   Financial trader   Weekly Topic [employer] Desserts   Instruction Review Code 1- Verbalizes Understanding   Class Start Time 1130   Class Stop Time 1205   Class Time Calculation (min) 35 min    [place] Name 11/03/21 1300     Education   Cardiac Education Topics [place]   Environmental consultant Exercise   Exercise Workshop Exercise Basics: Press photographer   Instruction Review Code 1- Verbalizes Understanding   Class Start Time 1140   Class Stop Time 1224   Class Time Calculation (min) 44 min            Core Videos: Exercise    Move It!  Clinical staff conducted group or individual video education with verbal and written material and guidebook.  Patient learns the recommended Pritikin exercise program. Exercise with the goal of living a long, healthy life. Some of the health benefits of exercise include controlled diabetes, healthier blood pressure levels, improved cholesterol levels, improved heart and lung capacity, improved sleep, and better body composition. Everyone should speak with their doctor before starting or changing an exercise routine.  Biomechanical Limitations Clinical staff conducted group or individual video education with verbal and written material and guidebook.  Patient learns how biomechanical limitations can impact exercise and how we can mitigate and possibly overcome limitations to have an impactful and balanced exercise routine.  Body Composition Clinical staff conducted group or individual video education with verbal  and written material and guidebook.  Patient learns that body composition (ratio of muscle mass to fat mass) is a key component to assessing overall fitness, rather than body weight alone. Increased fat mass, especially visceral belly fat, can put [place] at increased risk for metabolic  syndrome, type 2 diabetes, heart disease, and even death. It is recommended to combine diet and exercise (cardiovascular and resistance training) to improve your body composition. Seek guidance from your physician and exercise physiologist before implementing an exercise routine.  Exercise Action Plan Clinical staff conducted group or individual video education with verbal and written material and guidebook.  Patient learns the recommended strategies to achieve and enjoy long-term exercise adherence, including variety, self-motivation, self-efficacy, and positive decision making. Benefits of exercise include fitness, good health, weight management, more energy, better sleep, less stress, and overall well-being.  Medical   Heart Disease Risk Reduction Clinical staff conducted group or individual video education with verbal and written material and guidebook.  Patient learns our heart is our most vital organ as it circulates oxygen, nutrients, white blood cells, and hormones throughout the entire body, and carries waste away. Data supports a plant-based eating plan like the Pritikin Program for its effectiveness in slowing progression of and reversing heart disease. The video provides a number of recommendations to address heart disease.   Metabolic Syndrome and Belly Fat  Clinical staff conducted group or individual video education with verbal and written material and guidebook.  Patient learns what metabolic syndrome is, how it leads to heart disease, and how one can reverse it and keep it from coming back. You have metabolic syndrome if you have 3 of the following 5 criteria: abdominal obesity, high blood pressure, high triglycerides, low HDL cholesterol, and high blood sugar.  Hypertension and Heart Disease Clinical staff conducted group or individual video education with verbal and written material and guidebook.  Patient learns that high blood pressure, or hypertension, is very common in the  [place]. Hypertension is largely due to excessive salt intake, but other important risk factors include being overweight, physical inactivity, drinking too much alcohol, smoking, and not eating enough potassium from fruits and vegetables. High blood pressure is a leading risk factor for heart attack, stroke, congestive heart failure, dementia, kidney failure, and premature death. Long-term effects of excessive salt intake include stiffening of the arteries and thickening of heart muscle and organ damage. Recommendations include ways to reduce hypertension and the risk of heart disease.  Diseases of Our Time - Focusing on Diabetes Clinical staff conducted group or individual video education with verbal and written material and guidebook.  Patient learns why the best way to stop diseases of our time is prevention, through food and other lifestyle changes. Medicine (such as prescription pills and surgeries) is often only a Band-Aid on the problem, not a long-term solution. Most common diseases of our time include obesity, type 2 diabetes, hypertension, heart disease, and cancer. The Pritikin Program is recommended and has been proven to help reduce, reverse, and/or prevent the damaging effects of metabolic syndrome.  Nutrition   Overview of the Pritikin Eating Plan  Clinical staff conducted group or individual video education with verbal and written material and guidebook.  Patient learns about the [place] for disease risk reduction. The [place] emphasizes a wide variety of unrefined, minimally-processed carbohydrates, like fruits, vegetables, whole grains, and legumes. Go, Caution, and Stop food choices are explained. Plant-based and lean animal proteins are emphasized. Rationale provided for low  sodium intake for blood pressure control, low added sugars for blood sugar stabilization, and low added fats and oils for coronary artery disease risk reduction and weight  management.  Calorie Density  Clinical staff conducted group or individual video education with verbal and written material and guidebook.  Patient learns about calorie density and how it impacts the Pritikin Eating Plan. Knowing the characteristics of the food you choose will help you decide whether those foods will lead to weight gain or weight loss, and whether you want to consume more or less of them. Weight loss is usually a side effect of the Pritikin Eating Plan because of its focus on low calorie-dense foods.  Label Reading  Clinical staff conducted group or individual video education with verbal and written material and guidebook.  Patient learns about the Pritikin recommended label reading guidelines and corresponding recommendations regarding calorie density, added sugars, sodium content, and whole grains.  Dining Out - Part 1  Clinical staff conducted group or individual video education with verbal and written material and guidebook.  Patient learns that restaurant meals can be sabotaging because they can be so high in calories, fat, sodium, and/or sugar. Patient learns recommended strategies on how to positively address this and avoid unhealthy pitfalls.  Facts on Fats  Clinical staff conducted group or individual video education with verbal and written material and guidebook.  Patient learns that lifestyle modifications can be just as effective, if not more so, as many medications for lowering your risk of heart disease. A Pritikin lifestyle can help to reduce your risk of inflammation and atherosclerosis (cholesterol build-up, or plaque, in the artery walls). Lifestyle interventions such as dietary choices and physical activity address the cause of atherosclerosis. A review of the types of fats and their impact on blood cholesterol levels, along with dietary recommendations to reduce fat intake is also included.  Nutrition Action Plan  Clinical staff conducted group or individual  video education with verbal and written material and guidebook.  Patient learns how to incorporate Pritikin recommendations into their lifestyle. Recommendations include planning and keeping personal health goals in mind as an important part of their success.  Healthy Mind-Set    Healthy Minds, Bodies, Hearts  Clinical staff conducted group or individual video education with verbal and written material and guidebook.  Patient learns how to identify when they are stressed. Video will discuss the impact of that stress, as well as the many benefits of stress management. Patient will also be introduced to stress management techniques. The way we think, act, and feel has an impact on our hearts.  How Our Thoughts Can Heal Our Hearts  Clinical staff conducted group or individual video education with verbal and written material and guidebook.  Patient learns that negative thoughts can cause depression and anxiety. This can result in negative lifestyle behavior and serious health problems. Cognitive behavioral therapy is an effective method to help control our thoughts in order to change and improve our emotional outlook.  Additional Videos:  Exercise    Improving Performance  Clinical staff conducted group or individual video education with verbal and written material and guidebook.  Patient learns to use a non-linear approach by alternating intensity levels and lengths of time spent exercising to help burn more calories and lose more body fat. Cardiovascular exercise helps improve heart health, metabolism, hormonal balance, blood sugar control, and recovery from fatigue. Resistance training improves strength, endurance, balance, coordination, reaction time, metabolism, and muscle mass. Flexibility exercise improves circulation, posture, and  balance. Seek guidance from your physician and exercise physiologist before implementing an exercise routine and learn your capabilities and proper form for all  exercise.  Introduction to Yoga  Clinical staff conducted group or individual video education with verbal and written material and guidebook.  Patient learns about yoga, a discipline of the coming together of mind, breath, and body. The benefits of yoga include improved flexibility, improved range of motion, better posture and core strength, increased lung function, weight loss, and positive self-image. Yoga's heart health benefits include lowered blood pressure, healthier heart rate, decreased cholesterol and triglyceride levels, improved immune function, and reduced stress. Seek guidance from your physician and exercise physiologist before implementing an exercise routine and learn your capabilities and proper form for all exercise.  Medical   Aging: Enhancing Your Quality of Life  Clinical staff conducted group or individual video education with verbal and written material and guidebook.  Patient learns key strategies and recommendations to stay in good physical health and enhance quality of life, such as prevention strategies, having an advocate, securing a [place], and keeping a list of medications and system for tracking them. It also discusses how to avoid risk for bone loss.  Biology of Weight Control  Clinical staff conducted group or individual video education with verbal and written material and guidebook.  Patient learns that weight gain occurs because we consume more calories than we burn (eating more, moving less). Even if your body weight is normal, you may have higher ratios of fat compared to muscle mass. Too much body fat puts you at increased risk for cardiovascular disease, heart attack, stroke, type 2 diabetes, and obesity-related cancers. In addition to exercise, following the [place] can help reduce your risk.  Decoding Lab Results  Clinical staff conducted group or individual video education with verbal and written material and  guidebook.  Patient learns that lab test reflects one measurement whose values change over time and are influenced by many factors, including medication, stress, sleep, exercise, food, hydration, pre-existing medical conditions, and more. It is recommended to use the knowledge from this video to become more involved with your lab results and evaluate your numbers to speak with your doctor.   Diseases of Our Time - Overview  Clinical staff conducted group or individual video education with verbal and written material and guidebook.  Patient learns that according to the CDC, 50% to 70% of chronic diseases (such as obesity, type 2 diabetes, elevated lipids, hypertension, and heart disease) are avoidable through lifestyle improvements including healthier food choices, listening to satiety cues, and increased physical activity.  Sleep Disorders Clinical staff conducted group or individual video education with verbal and written material and guidebook.  Patient learns how good quality and duration of sleep are important to overall health and well-being. Patient also learns about sleep disorders and how they impact health along with recommendations to address them, including discussing with a physician.  Nutrition  Dining Out - Part 2 Clinical staff conducted group or individual video education with verbal and written material and guidebook.  Patient learns how to plan ahead and communicate in order to maximize their dining experience in a healthy and nutritious manner. Included are recommended food choices based on the type of restaurant the patient is visiting.   Fueling a Best boy conducted group or individual video education with verbal and written material and guidebook.  There is a strong connection between our food choices  and our health. Diseases like obesity and type 2 diabetes are very prevalent and are in large-part due to lifestyle choices. The Pritikin Eating Plan  provides plenty of food and hunger-curbing satisfaction. It is easy to follow, affordable, and helps reduce health risks.  Menu Workshop  Clinical staff conducted group or individual video education with verbal and written material and guidebook.  Patient learns that restaurant meals can sabotage health goals because they are often packed with calories, fat, sodium, and sugar. Recommendations include strategies to plan ahead and to communicate with the manager, chef, or server to help order a healthier meal.  Planning Your Eating Strategy  Clinical staff conducted group or individual video education with verbal and written material and guidebook.  Patient learns about the [place] and its benefit of reducing the risk of disease. The [place] does not focus on calories. Instead, it emphasizes high-quality, nutrient-rich foods. By knowing the characteristics of the foods, we choose, we can determine their calorie density and make informed decisions.  Targeting Your Nutrition Priorities  Clinical staff conducted group or individual video education with verbal and written material and guidebook.  Patient learns that lifestyle habits have a tremendous impact on disease risk and progression. This video provides eating and physical activity recommendations based on your personal health goals, such as reducing LDL cholesterol, losing weight, preventing or controlling type 2 diabetes, and reducing high blood pressure.  Vitamins and Minerals  Clinical staff conducted group or individual video education with verbal and written material and guidebook.  Patient learns different ways to obtain key vitamins and minerals, including through a recommended healthy diet. It is important to discuss all supplements you take with your doctor.   Healthy Mind-Set    Smoking Cessation  Clinical staff conducted group or individual video education with verbal and written material and guidebook.   Patient learns that cigarette smoking and tobacco addiction pose a serious health risk which affects millions of people. Stopping smoking will significantly reduce the risk of heart disease, lung disease, and many forms of cancer. Recommended strategies for quitting are covered, including working with your doctor to develop a successful plan.  Culinary   Becoming a Financial trader conducted group or individual video education with verbal and written material and guidebook.  Patient learns that cooking at home can be healthy, cost-effective, quick, and puts them in control. Keys to cooking healthy recipes will include looking at your recipe, assessing your equipment needs, planning ahead, making it simple, choosing cost-effective seasonal ingredients, and limiting the use of added fats, salts, and sugars.  Cooking - Breakfast and Snacks  Clinical staff conducted group or individual video education with verbal and written material and guidebook.  Patient learns how important breakfast is to satiety and nutrition through the entire day. Recommendations include key foods to eat during breakfast to help stabilize blood sugar levels and to prevent overeating at meals later in the day. Planning ahead is also a key component.  Cooking - Human resources officer conducted group or individual video education with verbal and written material and guidebook.  Patient learns eating strategies to improve overall health, including an approach to cook more at home. Recommendations include thinking of animal protein as a side on your plate rather than center stage and focusing instead on lower calorie dense options like vegetables, fruits, whole grains, and plant-based proteins, such as beans. Making sauces in large quantities to freeze for later and leaving  the skin on your vegetables are also recommended to maximize your experience.  Cooking - Healthy Salads and Dressing Clinical staff  conducted group or individual video education with verbal and written material and guidebook.  Patient learns that vegetables, fruits, whole grains, and legumes are the foundations of the [place]. Recommendations include how to incorporate each of these in flavorful and healthy salads, and how to create homemade salad dressings. Proper handling of ingredients is also covered. Cooking - Soups and [employer] - Soups and Desserts Clinical staff conducted group or individual video education with verbal and written material and guidebook.  Patient learns that Pritikin soups and desserts make for easy, nutritious, and delicious snacks and meal components that are low in sodium, fat, sugar, and calorie density, while high in vitamins, minerals, and filling fiber. Recommendations include simple and healthy ideas for soups and desserts.   Overview     The Pritikin Solution Program Overview Clinical staff conducted group or individual video education with verbal and written material and guidebook.  Patient learns that the results of the [place] Program have been documented in more than 100 articles published in peer-reviewed journals, and the benefits include reducing risk factors for (and, in some cases, even reversing) high cholesterol, high blood pressure, type 2 diabetes, obesity, and more! An overview of the three key pillars of the Pritikin Program will be covered: eating well, doing regular exercise, and having a healthy mind-set.  WORKSHOPS  Exercise: Exercise Basics: Building Your Action Plan Clinical staff led group instruction and group discussion with PowerPoint presentation and patient guidebook. To enhance the learning environment the use of posters, models and videos may be added. At the conclusion of this workshop, patients will comprehend the difference between physical activity and exercise, as well as the benefits of incorporating both, into their routine. Patients will  understand the FITT (Frequency, Intensity, Time, and Type) principle and how to use it to build an exercise action plan. In addition, safety concerns and other considerations for exercise and cardiac rehab will be addressed by the presenter. The purpose of this lesson is to promote a comprehensive and effective weekly exercise routine in order to improve patients' overall level of fitness.   Managing Heart Disease: Your Path to a Healthier Heart Clinical staff led group instruction and group discussion with PowerPoint presentation and patient guidebook. To enhance the learning environment the use of posters, models and videos may be added.At the conclusion of this workshop, patients will understand the anatomy and physiology of the heart. Additionally, they will understand how Pritikin's three pillars impact the risk factors, the progression, and the management of heart disease.  The purpose of this lesson is to provide a high-level overview of the heart, heart disease, and how the Pritikin lifestyle positively impacts risk factors.  Exercise Biomechanics Clinical staff led group instruction and group discussion with PowerPoint presentation and patient guidebook. To enhance the learning environment the use of posters, models and videos may be added. Patients will learn how the structural parts of their bodies function and how these functions impact their daily activities, movement, and exercise. Patients will learn how to promote a neutral spine, learn how to manage pain, and identify ways to improve their physical movement in order to promote healthy living. The purpose of this lesson is to expose patients to common physical limitations that impact physical activity. Participants will learn practical ways to adapt and manage aches and pains, and to minimize their effect on regular  exercise. Patients will learn how to maintain good posture while sitting, walking, and lifting.  Balance Training  and Fall Prevention  Clinical staff led group instruction and group discussion with PowerPoint presentation and patient guidebook. To enhance the learning environment the use of posters, models and videos may be added. At the conclusion of this workshop, patients will understand the importance of their sensorimotor skills (vision, proprioception, and the vestibular system) in maintaining their ability to balance as they age. Patients will apply a variety of balancing exercises that are appropriate for their current level of function. Patients will understand the common causes for poor balance, possible solutions to these problems, and ways to modify their physical environment in order to minimize their fall risk. The purpose of this lesson is to teach patients about the importance of maintaining balance as they age and ways to minimize their risk of falling.  WORKSHOPS   Nutrition:  Fueling a Scientist, research (physical sciences) led group instruction and group discussion with PowerPoint presentation and patient guidebook. To enhance the learning environment the use of posters, models and videos may be added. Patients will review the foundational principles of the [place] and understand what constitutes a serving size in each of the food groups. Patients will also learn Pritikin-friendly foods that are better choices when away from home and review make-ahead meal and snack options. Calorie density will be reviewed and applied to three nutrition priorities: weight maintenance, weight loss, and weight gain. The purpose of this lesson is to reinforce (in a group setting) the key concepts around what patients are recommended to eat and how to apply these guidelines when away from home by planning and selecting Pritikin-friendly options. Patients will understand how calorie density may be adjusted for different weight management goals.  Mindful Eating  Clinical staff led group instruction and group  discussion with PowerPoint presentation and patient guidebook. To enhance the learning environment the use of posters, models and videos may be added. Patients will briefly review the concepts of the [place] and the importance of low-calorie dense foods. The concept of mindful eating will be introduced as well as the importance of paying attention to internal hunger signals. Triggers for non-hunger eating and techniques for dealing with triggers will be explored. The purpose of this lesson is to provide patients with the opportunity to review the basic principles of the [place], discuss the value of eating mindfully and how to measure internal cues of hunger and fullness using the Hunger Scale. Patients will also discuss reasons for non-hunger eating and learn strategies to use for controlling emotional eating.  Targeting Your Nutrition Priorities Clinical staff led group instruction and group discussion with PowerPoint presentation and patient guidebook. To enhance the learning environment the use of posters, models and videos may be added. Patients will learn how to determine their genetic susceptibility to disease by reviewing their family history. Patients will gain insight into the importance of diet as part of an overall healthy lifestyle in mitigating the impact of genetics and other environmental insults. The purpose of this lesson is to provide patients with the opportunity to assess their personal nutrition priorities by looking at their family history, their own health history and current risk factors. Patients will also be able to discuss ways of prioritizing and modifying the [place] for their highest risk areas  Menu  Clinical staff led group instruction and group discussion with PowerPoint presentation and patient guidebook. To enhance the learning  environment the use of posters, models and videos may be added. Using menus brought in from [employer],  or printed from [employer], patients will apply the [place] dining out guidelines that were presented in the [employer] video. Patients will also be able to practice these guidelines in a variety of provided scenarios. The purpose of this lesson is to provide patients with the opportunity to practice hands-on learning of the [place] with actual menus and practice scenarios.  Label Reading Clinical staff led group instruction and group discussion with PowerPoint presentation and patient guidebook. To enhance the learning environment the use of posters, models and videos may be added. Patients will review and discuss the Pritikin label reading guidelines presented in Pritikin's Label Reading Educational series video. Using fool labels brought in from local grocery stores and markets, patients will apply the label reading guidelines and determine if the packaged food meet the Pritikin guidelines. The purpose of this lesson is to provide patients with the opportunity to review, discuss, and practice hands-on learning of the Pritikin Label Reading guidelines with actual packaged food labels. [place] Workshops are designed to teach patients ways to prepare quick, simple, and affordable recipes at home. The importance of nutrition's role in chronic disease risk reduction is reflected in its emphasis in the overall Pritikin program. By learning how to prepare essential core Pritikin Eating Plan recipes, patients will increase control over what they eat; be able to customize the flavor of foods without the use of added salt, sugar, or fat; and improve the quality of the food they consume. By learning a set of core recipes which are easily assembled, quickly prepared, and affordable, patients are more likely to prepare more healthy foods at home. These workshops focus on convenient breakfasts, simple entres, side dishes, and desserts which  can be prepared with minimal effort and are consistent with nutrition recommendations for cardiovascular risk reduction. Cooking [employer] are taught by a Engineer, materials (RD) who has been trained by the [employer]. The chef or RD has a clear understanding of the importance of minimizing - if not completely eliminating - added fat, sugar, and sodium in recipes. Throughout the series of [place] Workshop sessions, patients will learn about healthy ingredients and efficient methods of cooking to build confidence in their capability to prepare    Cooking School weekly topics:  Adding Flavor- Sodium-Free  Fast and Healthy Breakfasts  Powerhouse Plant-Based Proteins  Satisfying Salads and Dressings  Simple Sides and Sauces  International Cuisine-Spotlight on the [place] Zones  Delicious Desserts  Savory Soups  Efficiency Cooking - Meals in a Snap  Tasty Appetizers and Snacks  Comforting Weekend Breakfasts  One-Pot Wonders   Fast Evening Meals  Easy [place] (Psychosocial): New Thoughts, New Behaviors Clinical staff led group instruction and group discussion with PowerPoint presentation and patient guidebook. To enhance the learning environment the use of posters, models and videos may be added. Patients will learn and practice techniques for developing effective health and lifestyle goals. Patients will be able to effectively apply the goal setting process learned to develop at least one new personal goal.  The purpose of this lesson is to expose patients to a new skill set of behavior modification techniques such as techniques setting SMART goals, overcoming barriers, and achieving new thoughts and new behaviors.  Managing Moods and Relationships Clinical  staff led group instruction and group discussion with PowerPoint presentation and patient guidebook. To enhance the learning  environment the use of posters, models and videos may be added. Patients will learn how emotional and chronic stress factors can impact their health and relationships. They will learn healthy ways to manage their moods and utilize positive coping mechanisms. In addition, ICR patients will learn ways to improve communication skills. The purpose of this lesson is to expose patients to ways of understanding how one's mood and health are intimately connected. Developing a healthy outlook can help build positive relationships and connections with others. Patients will understand the importance of utilizing effective communication skills that include actively listening and being heard. They will learn and understand the importance of the "4 Cs" and especially Connections in fostering of a Healthy Mind-Set.  Healthy Sleep for a Healthy Heart Clinical staff led group instruction and group discussion with PowerPoint presentation and patient guidebook. To enhance the learning environment the use of posters, models and videos may be added. At the conclusion of this workshop, patients will be able to demonstrate knowledge of the importance of sleep to overall health, well-being, and quality of life. They will understand the symptoms of, and treatments for, common sleep disorders. Patients will also be able to identify daytime and nighttime behaviors which impact sleep, and they will be able to apply these tools to help manage sleep-related challenges. The purpose of this lesson is to provide patients with a general overview of sleep and outline the importance of quality sleep. Patients will learn about a few of the most common sleep disorders. Patients will also be introduced to the concept of "sleep hygiene," and discover ways to self-manage certain sleeping problems through simple daily behavior changes. Finally, the workshop will motivate patients by clarifying the links between quality sleep and their goals of  heart-healthy living.   Recognizing and Reducing Stress Clinical staff led group instruction and group discussion with PowerPoint presentation and patient guidebook. To enhance the learning environment the use of posters, models and videos may be added. At the conclusion of this workshop, patients will be able to understand the types of stress reactions, differentiate between acute and chronic stress, and recognize the impact that chronic stress has on their health. They will also be able to apply different coping mechanisms, such as reframing negative self-talk. Patients will have the opportunity to practice a variety of stress management techniques, such as deep abdominal breathing, progressive muscle relaxation, and/or guided imagery.  The purpose of this lesson is to educate patients on the role of stress in their lives and to provide healthy techniques for coping with it.  Learning Barriers/Preferences:  Learning Barriers/Preferences - 10/23/21 1126       Learning Barriers/Preferences   Learning Barriers Exercise Concerns   Balance concerns   Learning Preferences Written Material;Pictoral             Education Topics:  Knowledge Questionnaire Score:  Knowledge Questionnaire Score - 10/27/21 1416       Knowledge Questionnaire Score   Pre Score 19/24             Core Components/Risk Factors/Patient Goals at Admission:  Personal Goals and Risk Factors at Admission - 10/23/21 1129       Core Components/Risk Factors/Patient Goals on Admission    Weight Management Yes;Weight Maintenance    Intervention Weight Management: Develop a combined nutrition and exercise program designed to reach desired caloric intake, while maintaining appropriate intake of nutrient  and fiber, sodium and fats, and appropriate energy expenditure required for the weight goal.;Weight Management: Provide education and appropriate resources to help participant work on and attain dietary goals.;Weight  Management/Obesity: Establish reasonable short term and long term weight goals.    Admit Weight 196 lb 6.9 oz (89.1 kg)    Expected Outcomes Short Term: Continue to assess and modify interventions until short term weight is achieved;Long Term: Adherence to nutrition and physical activity/exercise program aimed toward attainment of established weight goal;Weight Maintenance: Understanding of the daily nutrition guidelines, which includes 25-35% calories from fat, 7% or less cal from saturated fats, less than '200mg'$  cholesterol, less than 1.5gm of sodium, & 5 or more servings of fruits and vegetables daily;Understanding recommendations for meals to include 15-35% energy as protein, 25-35% energy from fat, 35-60% energy from carbohydrates, less than '200mg'$  of dietary cholesterol, 20-35 gm of total fiber daily;Understanding of distribution of calorie intake throughout the day with the consumption of 4-5 meals/snacks    Heart Failure Yes    Intervention Provide a combined exercise and nutrition program that is supplemented with education, support and counseling about heart failure. Directed toward relieving symptoms such as shortness of breath, decreased exercise tolerance, and extremity edema.    Expected Outcomes Improve functional capacity of life;Short term: Attendance in program 2-3 days a week with increased exercise capacity. Reported lower sodium intake. Reported increased fruit and vegetable intake. Reports medication compliance.;Short term: Daily weights obtained and reported for increase. Utilizing diuretic protocols set by physician.;Long term: Adoption of self-care skills and reduction of barriers for early signs and symptoms recognition and intervention leading to self-care maintenance.    Hypertension Yes    Intervention Provide education on lifestyle modifcations including regular physical activity/exercise, weight management, moderate sodium restriction and increased consumption of fresh fruit,  vegetables, and low fat dairy, alcohol moderation, and smoking cessation.;Monitor prescription use compliance.    Expected Outcomes Short Term: Continued assessment and intervention until BP is < 140/53m HG in hypertensive participants. < 130/833mHG in hypertensive participants with diabetes, heart failure or chronic kidney disease.;Long Term: Maintenance of blood pressure at goal levels.    Lipids Yes    Intervention Provide education and support for participant on nutrition & aerobic/resistive exercise along with prescribed medications to achieve LDL '70mg'$ , HDL >'40mg'$ .    Expected Outcomes Short Term: Participant states understanding of desired cholesterol values and is compliant with medications prescribed. Participant is following exercise prescription and nutrition guidelines.;Long Term: Cholesterol controlled with medications as prescribed, with individualized exercise RX and with personalized nutrition plan. Value goals: LDL < '70mg'$ , HDL > 40 mg.    Stress Yes    Intervention Offer individual and/or small group education and counseling on adjustment to heart disease, stress management and health-related lifestyle change. Teach and support self-help strategies.;Refer participants experiencing significant psychosocial distress to appropriate mental health specialists for further evaluation and treatment. When possible, include family members and significant others in education/counseling sessions.    Expected Outcomes Short Term: Participant demonstrates changes in health-related behavior, relaxation and other stress management skills, ability to obtain effective social support, and compliance with psychotropic medications if prescribed.;Long Term: Emotional wellbeing is indicated by absence of clinically significant psychosocial distress or social isolation.    Personal Goal Other Yes    Personal Goal Short term: Flexibility and consult with RD Long term: maintain wt loss and stamina    Intervention  Will continue to monitor pt and progress workloads as tolerated without sign or symptom  Expected Outcomes Pt will achieve his goals and gain knowledge and stamina             Core Components/Risk Factors/Patient Goals Review:   Goals and Risk Factor Review     Row Name 10/28/21 0824 11/04/21 1102           Core Components/Risk Factors/Patient Goals Review   Personal Goals Review Weight Management/Obesity;Heart Failure;Stress;Hypertension;Lipids Weight Management/Obesity;Heart Failure;Stress;Hypertension;Lipids      Review Leodis started intensive caridac rehab on 10/27/21 and did well with exercise. Vital signs were stable. Jerad started intensive cardiac rehab on 10/27/21. Noted that patient was recently started on Entresto will continue to monitor BP. Kalev is off to a good start to exercise.      Expected Outcomes Elester will contibue to participate in intensive cardiac rehab for exercise, nutrition and lifestyle modifications Brynden will contibue to participate in intensive cardiac rehab for exercise, nutrition and lifestyle modifications               Core Components/Risk Factors/Patient Goals at Discharge (Final Review):   Goals and Risk Factor Review - 11/04/21 1102       Core Components/Risk Factors/Patient Goals Review   Personal Goals Review Weight Management/Obesity;Heart Failure;Stress;Hypertension;Lipids    Review Othal started intensive cardiac rehab on 10/27/21. Noted that patient was recently started on Entresto will continue to monitor BP. Domenico is off to a good start to exercise.    Expected Outcomes Jarmar will contibue to participate in intensive cardiac rehab for exercise, nutrition and lifestyle modifications             ITP Comments:  ITP Comments     Row Name 10/23/21 1018 10/28/21 0821 11/04/21 1101       ITP Comments Dr Fransico Him MD, Medical Director, Introduction to [facility] Education Program/ Intensive Cardiac Rehab. Initial Orientation  Packet Reviewed with the Patient 30 Day ITP Review. Ricki started intensive cardiac rehab on 10/28/21 and did well with exercise 30 Day ITP Review. Adarsh is off to a good start to exercise at  intensive cardiac rehab              Comments: See ITP Comments

## 2021-11-04 NOTE — Patient Instructions (Signed)
Visit Information  Thank you for taking time to visit with me today. Please don't hesitate to contact me if I can be of assistance to you.   Following are the goals we discussed today:   Goals Addressed               This Visit's Progress     COMPLETED: No needs at this time (pt-stated)        Care Coordination Interventions: Basic overview and discussion of pathophysiology of Heart Failure reviewed Discussed importance of daily weight and advised patient to weigh and record daily Reviewed role of diuretics in prevention of fluid overload and management of heart failure; Discussed the importance of keeping all appointments with provider Provided patient with education about the role of exercise in the management of heart failure Screening for signs and symptoms of depression related to chronic disease state  Assessed social determinant of health barriers  Pt declined case management services at this time due to cardiac rehab involvement and ongoing education on his visits.           Please call the care guide team at [phone] if you need to cancel or reschedule your appointment.   If you are experiencing a Mental Health or [place] or need someone to talk to, please call the Suicide and Crisis Lifeline: 988  Patient verbalizes understanding of instructions and care plan provided today and agrees to view in [place]. Active MyChart status and patient understanding of how to access instructions and care plan via MyChart confirmed with patient.     No further follow up required: No needs  Raina Mina, RN Care Management Coordinator [place] [facility] [phone]

## 2021-11-05 ENCOUNTER — Encounter (HOSPITAL_COMMUNITY)
Admission: RE | Admit: 2021-11-05 | Discharge: 2021-11-05 | Disposition: A | Discharge status: Home / Self Care | Payer: Medicare Other | Source: Ambulatory Visit | Attending: Internal Medicine | Admitting: Internal Medicine

## 2021-11-05 ENCOUNTER — Encounter: Payer: Self-pay | Admitting: *Deleted

## 2021-11-05 DIAGNOSIS — Z952 Presence of prosthetic heart valve: Secondary | ICD-10-CM

## 2021-11-05 DIAGNOSIS — I10 Essential (primary) hypertension: Secondary | ICD-10-CM | POA: Diagnosis not present

## 2021-11-05 DIAGNOSIS — Z951 Presence of aortocoronary bypass graft: Secondary | ICD-10-CM | POA: Diagnosis not present

## 2021-11-05 DIAGNOSIS — I350 Nonrheumatic aortic (valve) stenosis: Secondary | ICD-10-CM | POA: Diagnosis not present

## 2021-11-05 DIAGNOSIS — Z48812 Encounter for surgical aftercare following surgery on the circulatory system: Secondary | ICD-10-CM | POA: Diagnosis not present

## 2021-11-05 DIAGNOSIS — E785 Hyperlipidemia, unspecified: Secondary | ICD-10-CM | POA: Diagnosis not present

## 2021-11-05 NOTE — Telephone Encounter (Signed)
This encounter was created in error - please disregard.

## 2021-11-05 NOTE — Progress Notes (Signed)
Blood pressure 88/43 post exercise today. Patient asymptomatic. Given water recheck BP 97/50. Will notify Angelena [facility] as Mr Hackbart was recently started on Entresto.Will continue to monitor the patient throughout  the program. Harrell Gave RN BSN

## 2021-11-06 DIAGNOSIS — D414 Neoplasm of uncertain behavior of bladder: Secondary | ICD-10-CM | POA: Diagnosis not present

## 2021-11-06 DIAGNOSIS — R310 Gross hematuria: Secondary | ICD-10-CM | POA: Diagnosis not present

## 2021-11-06 DIAGNOSIS — R82998 Other abnormal findings in urine: Secondary | ICD-10-CM | POA: Diagnosis not present

## 2021-11-07 ENCOUNTER — Encounter (HOSPITAL_COMMUNITY)
Admission: RE | Admit: 2021-11-07 | Discharge: 2021-11-07 | Disposition: A | Discharge status: Home / Self Care | Payer: Medicare Other | Source: Ambulatory Visit | Attending: Internal Medicine | Admitting: Internal Medicine

## 2021-11-07 DIAGNOSIS — Z951 Presence of aortocoronary bypass graft: Secondary | ICD-10-CM | POA: Diagnosis not present

## 2021-11-07 DIAGNOSIS — Z48812 Encounter for surgical aftercare following surgery on the circulatory system: Secondary | ICD-10-CM | POA: Diagnosis not present

## 2021-11-07 DIAGNOSIS — I350 Nonrheumatic aortic (valve) stenosis: Secondary | ICD-10-CM | POA: Diagnosis not present

## 2021-11-07 DIAGNOSIS — Z952 Presence of prosthetic heart valve: Secondary | ICD-10-CM

## 2021-11-07 DIAGNOSIS — I10 Essential (primary) hypertension: Secondary | ICD-10-CM | POA: Diagnosis not present

## 2021-11-07 DIAGNOSIS — E785 Hyperlipidemia, unspecified: Secondary | ICD-10-CM | POA: Diagnosis not present

## 2021-11-07 NOTE — Progress Notes (Signed)
Reviewed home exercise guidelines with patient including endpoints, temperature precautions, target heart rate and rate of perceived exertion. [facility] and using the equipment as his mode of home exercise. Patient currently averages 3000 steps/day. Patient is able to manually check his pulse. Patient voices understanding of instructions given.  Sol Passer, MS, ACSM CEP

## 2021-11-10 ENCOUNTER — Encounter (HOSPITAL_COMMUNITY)
Admission: RE | Admit: 2021-11-10 | Discharge: 2021-11-10 | Disposition: A | Discharge status: Home / Self Care | Payer: Medicare Other | Source: Ambulatory Visit | Attending: Internal Medicine | Admitting: Internal Medicine

## 2021-11-10 DIAGNOSIS — I10 Essential (primary) hypertension: Secondary | ICD-10-CM | POA: Diagnosis not present

## 2021-11-10 DIAGNOSIS — Z952 Presence of prosthetic heart valve: Secondary | ICD-10-CM | POA: Diagnosis not present

## 2021-11-10 DIAGNOSIS — Z48812 Encounter for surgical aftercare following surgery on the circulatory system: Secondary | ICD-10-CM | POA: Diagnosis not present

## 2021-11-10 DIAGNOSIS — E785 Hyperlipidemia, unspecified: Secondary | ICD-10-CM | POA: Diagnosis not present

## 2021-11-10 DIAGNOSIS — Z951 Presence of aortocoronary bypass graft: Secondary | ICD-10-CM | POA: Diagnosis not present

## 2021-11-10 DIAGNOSIS — I350 Nonrheumatic aortic (valve) stenosis: Secondary | ICD-10-CM | POA: Diagnosis not present

## 2021-11-10 NOTE — Progress Notes (Signed)
Luis Herrera in today for cardiac rehab.  Weight today elevated 93.1 kg and last weight 89.9 kg.  Weight verified.  Luis Herrera admits to eating two slices of cheese pizza from [employer] last evening along with a large salad. Up until this point his eating habits aligned with heart healthy choices.  O2 saturation 97%, lungs mostly clear bilaterally with faint soft rales that cleared with coughing.  [name] has 1+ pedal pitting edema.  Per [name] has a Lasix 20 mg prn order to take when he has unusual shortness of breath or weight gain. Advised to take and eat a banana when he gets home.  Will follow up on his next cardiac rehab session - Wednesday 9/27.   Verbalized understanding. [name], BSN Cardiac and Training and development officer

## 2021-11-11 ENCOUNTER — Telehealth: Payer: Self-pay | Admitting: Internal Medicine

## 2021-11-11 ENCOUNTER — Ambulatory Visit (HOSPITAL_COMMUNITY)
Admission: RE | Admit: 2021-11-11 | Discharge: 2021-11-11 | Disposition: A | Discharge status: Home / Self Care | Payer: Medicare Other | Source: Ambulatory Visit | Attending: Physician Assistant | Admitting: Physician Assistant

## 2021-11-11 ENCOUNTER — Other Ambulatory Visit: Payer: Self-pay | Admitting: Urology

## 2021-11-11 DIAGNOSIS — I700 Atherosclerosis of aorta: Secondary | ICD-10-CM | POA: Insufficient documentation

## 2021-11-11 DIAGNOSIS — I517 Cardiomegaly: Secondary | ICD-10-CM | POA: Insufficient documentation

## 2021-11-11 DIAGNOSIS — Z952 Presence of prosthetic heart valve: Secondary | ICD-10-CM | POA: Insufficient documentation

## 2021-11-11 MED ORDER — IOHEXOL 350 MG/ML SOLN
100.0000 mL | Freq: Once | INTRAVENOUS | Status: AC | PRN
Start: 2021-11-11 — End: 2021-11-11
  Administered 2021-11-11: 100 mL via INTRAVENOUS

## 2021-11-11 NOTE — Telephone Encounter (Signed)
   Pre-operative Risk Assessment    Patient Name: Luis Herrera  DOB: [birth year] MRN: [id number]      Request for Surgical Clearance    Procedure:   Bladder tumor removal  Date of Surgery:  Clearance 11/24/21                                 Surgeon:  Dr. Link Snuffer Surgeon's Group or Practice Name:  [facility]  Phone number:  [phone] Fax number:  [phone]   Type of Clearance Requested:   - Medical  - Pharmacy:  Hold Aspirin     Type of Anesthesia:  General    Additional requests/questions:    Signed, April Henson   11/11/2021, 3:47 PM

## 2021-11-12 ENCOUNTER — Encounter (HOSPITAL_COMMUNITY)
Admission: RE | Admit: 2021-11-12 | Discharge: 2021-11-12 | Disposition: A | Discharge status: Home / Self Care | Payer: Medicare Other | Source: Ambulatory Visit | Attending: Internal Medicine | Admitting: Internal Medicine

## 2021-11-12 DIAGNOSIS — I350 Nonrheumatic aortic (valve) stenosis: Secondary | ICD-10-CM | POA: Diagnosis not present

## 2021-11-12 DIAGNOSIS — Z951 Presence of aortocoronary bypass graft: Secondary | ICD-10-CM | POA: Diagnosis not present

## 2021-11-12 DIAGNOSIS — Z952 Presence of prosthetic heart valve: Secondary | ICD-10-CM

## 2021-11-12 DIAGNOSIS — I10 Essential (primary) hypertension: Secondary | ICD-10-CM | POA: Diagnosis not present

## 2021-11-12 DIAGNOSIS — Z48812 Encounter for surgical aftercare following surgery on the circulatory system: Secondary | ICD-10-CM | POA: Diagnosis not present

## 2021-11-12 DIAGNOSIS — E785 Hyperlipidemia, unspecified: Secondary | ICD-10-CM | POA: Diagnosis not present

## 2021-11-12 NOTE — Telephone Encounter (Signed)
   Patient Name: Luis Herrera  DOB: October 31, 1940 MRN: [id number]  Primary Cardiologist: [name], MD  Chart reviewed as part of pre-operative protocol coverage. Given past medical history and time since last visit, based on ACC/AHA guidelines, Jahlil Ziller would be at acceptable risk for the planned procedure without further cardiovascular testing. He is able to complete > 4 METS without difficulty.   The patient was advised that if he develops new symptoms prior to surgery to contact our office to arrange for a follow-up visit, and he verbalized understanding.  Per office protocol, he may hold Aspirin for 5-7 days prior to procedure. Please resume Aspirin as soon as possible postprocedure, at the discretion of the surgeon.  I will route this recommendation to the requesting party via Epic fax function and remove from pre-op pool.  Please call with questions.  Lenna Sciara, NP 11/12/2021, 11:39 AM

## 2021-11-14 ENCOUNTER — Encounter (HOSPITAL_COMMUNITY)
Admission: RE | Admit: 2021-11-14 | Discharge: 2021-11-14 | Disposition: A | Discharge status: Home / Self Care | Payer: Medicare Other | Source: Ambulatory Visit | Attending: Internal Medicine | Admitting: Internal Medicine

## 2021-11-14 DIAGNOSIS — I10 Essential (primary) hypertension: Secondary | ICD-10-CM | POA: Diagnosis not present

## 2021-11-14 DIAGNOSIS — Z952 Presence of prosthetic heart valve: Secondary | ICD-10-CM

## 2021-11-14 DIAGNOSIS — Z951 Presence of aortocoronary bypass graft: Secondary | ICD-10-CM | POA: Diagnosis not present

## 2021-11-14 DIAGNOSIS — E785 Hyperlipidemia, unspecified: Secondary | ICD-10-CM | POA: Diagnosis not present

## 2021-11-14 DIAGNOSIS — Z48812 Encounter for surgical aftercare following surgery on the circulatory system: Secondary | ICD-10-CM | POA: Diagnosis not present

## 2021-11-14 DIAGNOSIS — I350 Nonrheumatic aortic (valve) stenosis: Secondary | ICD-10-CM | POA: Diagnosis not present

## 2021-11-17 ENCOUNTER — Encounter (HOSPITAL_COMMUNITY): Payer: Medicare Other

## 2021-11-17 ENCOUNTER — Telehealth (HOSPITAL_COMMUNITY): Payer: Self-pay | Admitting: Family Medicine

## 2021-11-18 NOTE — Progress Notes (Signed)
COVID Vaccine Completed:  Date of COVID positive in last 90 days:  PCP - Grier Mitts, MD Cardiologist - [name], MD  Cardiac clearance by Diona Browner 11/11/21 Epic  Chest x-ray - 09/29/21 Epic EKG - 10/08/21 Epic Stress Test -  ECHO - 10/31/21 Epic Cardiac Cath - 09/24/21 Epic Pacemaker/ICD device last checked: Spinal Cord Stimulator:  Bowel Prep -   Sleep Study -  CPAP -   Fasting Blood Sugar -  Checks Blood Sugar _____ times a day  Blood Thinner Instructions: Aspirin Instructions: ASA 81, hold 5-7 days Last Dose:  Activity level:  Can go up a flight of stairs and perform activities of daily living without stopping and without symptoms of chest pain or shortness of breath.  Able to exercise without symptoms  Unable to go up a flight of stairs without symptoms of     Anesthesia review: CAD, aortic stenosis, HTN, RBBB, HF, CABG, bioprosthetic calve dysfunction   Patient denies shortness of breath, fever, cough and chest pain at PAT appointment  Patient verbalized understanding of instructions that were given to them at the PAT appointment. Patient was also instructed that they will need to review over the PAT instructions again at home before surgery.

## 2021-11-18 NOTE — Patient Instructions (Signed)
SURGICAL WAITING ROOM VISITATION Patients having surgery or a procedure may have no more than 2 support people in the waiting area - these visitors may rotate.   Children under the age of 59 must have an adult with them who is not the patient. If the patient needs to stay at the hospital during part of their recovery, the visitor guidelines for inpatient rooms apply. Pre-op nurse will coordinate an appropriate time for 1 support person to accompany patient in pre-op.  This support person may not rotate.    Please refer to the [facility] website for the visitor guidelines for Inpatients (after your surgery is over and you are in a regular room).    Your procedure is scheduled on: 11/24/21   Report to [facility] Main Entrance    Report to admitting at 9:45 AM   Call this number if you have problems the morning of surgery [phone]   Do not eat food or drink fluids :After Midnight.         If you have questions, please contact your surgeon's office.   FOLLOW BOWEL PREP AND ANY ADDITIONAL PRE OP INSTRUCTIONS YOU RECEIVED FROM YOUR SURGEON'S OFFICE!!!     Oral Hygiene is also important to reduce your risk of infection.                                    Remember - BRUSH YOUR TEETH THE MORNING OF SURGERY WITH YOUR REGULAR TOOTHPASTE   Do NOT smoke after Midnight   Take these medicines the morning of surgery with A SIP OF WATER: Amiodarone, Amlodipine, Atorvastatin, Carvedilol, Doxycycline, Zetia, Levothyroxine, Flomax  DO NOT TAKE ANY ORAL DIABETIC MEDICATIONS DAY OF YOUR SURGERY  Bring CPAP mask and tubing day of surgery.                              You may not have any metal on your body including jewelry, and body piercing             Do not wear lotions, powders, cologne, or deodorant              Men may shave face and neck.   Do not bring valuables to the hospital. [place].   Contacts, dentures or bridgework  may not be worn into surgery.  DO NOT [place]. PHARMACY WILL DISPENSE MEDICATIONS LISTED ON YOUR MEDICATION LIST TO YOU DURING YOUR ADMISSION [place]!    Patients discharged on the day of surgery will not be allowed to drive home.  Someone NEEDS to stay with you for the first 24 hours after anesthesia.   Special Instructions: Bring a copy of your healthcare power of attorney and living will documents the day of surgery if you haven't scanned them before.              Please read over the following fact sheets you were given: IF [place] [phone] Schneiders   If you received a COVID test during your pre-op visit  it is requested that you wear a mask when out in public, stay away from anyone that may not be feeling well and notify your surgeon  if you develop symptoms. If you test positive for Covid or have been in contact with anyone that has tested positive in the last 10 days please notify you surgeon.     [place] - Preparing for Surgery Before surgery, you can play an important role.  Because skin is not sterile, your skin needs to be as free of germs as possible.  You can reduce the number of germs on your skin by washing with CHG (chlorahexidine gluconate) soap before surgery.  CHG is an antiseptic cleaner which kills germs and bonds with the skin to continue killing germs even after washing. Please DO NOT use if you have an allergy to CHG or antibacterial soaps.  If your skin becomes reddened/irritated stop using the CHG and inform your nurse when you arrive at Short Stay. Do not shave (including legs and underarms) for at least 48 hours prior to the first CHG shower.  You may shave your face/neck.  Please follow these instructions carefully:  1.  Shower with CHG Soap the night before surgery and the  morning of surgery.  2.  If you choose to wash your hair, wash your hair first as usual  with your normal  shampoo.  3.  After you shampoo, rinse your hair and body thoroughly to remove the shampoo.                             4.  Use CHG as you would any other liquid soap.  You can apply chg directly to the skin and wash.  Gently with a scrungie or clean washcloth.  5.  Apply the CHG Soap to your body ONLY FROM THE NECK DOWN.   Do   not use on face/ open                           Wound or open sores. Avoid contact with eyes, ears mouth and   genitals (private parts).                       Wash face,  Genitals (private parts) with your normal soap.             6.  Wash thoroughly, paying special attention to the area where your    surgery  will be performed.  7.  Thoroughly rinse your body with warm water from the neck down.  8.  DO NOT shower/wash with your normal soap after using and rinsing off the CHG Soap.                9.  Pat yourself dry with a clean towel.            10.  Wear clean pajamas.            11.  Place clean sheets on your bed the night of your first shower and do not  sleep with pets. Day of Surgery : Do not apply any lotions/deodorants the morning of surgery.  Please wear clean clothes to the hospital/surgery center.  FAILURE TO FOLLOW THESE INSTRUCTIONS MAY RESULT IN THE CANCELLATION OF YOUR SURGERY  PATIENT SIGNATURE_________________________________  NURSE SIGNATURE__________________________________  ________________________________________________________________________

## 2021-11-19 ENCOUNTER — Encounter (HOSPITAL_COMMUNITY)
Admission: RE | Admit: 2021-11-19 | Discharge: 2021-11-19 | Disposition: A | Discharge status: Home / Self Care | Payer: Medicare Other | Source: Ambulatory Visit | Attending: Internal Medicine | Admitting: Internal Medicine

## 2021-11-19 DIAGNOSIS — Z952 Presence of prosthetic heart valve: Secondary | ICD-10-CM | POA: Diagnosis not present

## 2021-11-19 DIAGNOSIS — Z951 Presence of aortocoronary bypass graft: Secondary | ICD-10-CM | POA: Insufficient documentation

## 2021-11-19 DIAGNOSIS — Z87891 Personal history of nicotine dependence: Secondary | ICD-10-CM | POA: Insufficient documentation

## 2021-11-19 DIAGNOSIS — D494 Neoplasm of unspecified behavior of bladder: Secondary | ICD-10-CM | POA: Insufficient documentation

## 2021-11-19 DIAGNOSIS — I10 Essential (primary) hypertension: Secondary | ICD-10-CM | POA: Insufficient documentation

## 2021-11-19 DIAGNOSIS — I2510 Atherosclerotic heart disease of native coronary artery without angina pectoris: Secondary | ICD-10-CM | POA: Insufficient documentation

## 2021-11-19 DIAGNOSIS — I4510 Unspecified right bundle-branch block: Secondary | ICD-10-CM | POA: Diagnosis not present

## 2021-11-19 NOTE — Patient Instructions (Addendum)
SURGICAL WAITING ROOM VISITATION Patients having surgery or a procedure may have no more than 2 support people in the waiting area - these visitors may rotate.   Children under the age of 74 must have an adult with them who is not the patient. If the patient needs to stay at the hospital during part of their recovery, the visitor guidelines for inpatient rooms apply. Pre-op nurse will coordinate an appropriate time for 1 support person to accompany patient in pre-op.  This support person may not rotate.    Please refer to the [facility] website for the visitor guidelines for Inpatients (after your surgery is over and you are in a regular room).                Your procedure is scheduled on: 11/24/21               Report to [facility] Main Entrance               Report to admitting at 9:45 AM               Call this number if you have problems the morning of surgery [phone]               Do not eat food or drink fluids :After Midnight.          If you have questions, please contact your surgeon's office.     FOLLOW BOWEL PREP AND ANY ADDITIONAL PRE OP INSTRUCTIONS YOU RECEIVED FROM YOUR SURGEON'S OFFICE!!!     Oral Hygiene is also important to reduce your risk of infection.                                    Remember - BRUSH YOUR TEETH THE MORNING OF SURGERY WITH YOUR REGULAR TOOTHPASTE               Do NOT smoke after Midnight               Take these medicines the morning of surgery with A SIP OF WATER: Amiodarone, Amlodipine, Atorvastatin, Carvedilol, Doxycycline, Levothyroxine, Flomax   DO NOT TAKE ANY ORAL DIABETIC MEDICATIONS DAY OF YOUR SURGERY   Bring CPAP mask and tubing day of surgery.                              You may not have any metal on your body including jewelry, and body piercing              Do not wear lotions, powders, cologne, or deodorant               Men may shave face and neck.               Do not bring valuables to the hospital. [place].               Contacts, dentures or bridgework may not be worn into surgery.   DO NOT [place]. PHARMACY WILL DISPENSE MEDICATIONS LISTED ON YOUR MEDICATION LIST TO YOU DURING YOUR ADMISSION [place]!  Patients discharged on the day of surgery will not be allowed to drive home.  Someone NEEDS to stay with you for the first 24 hours after anesthesia.               Special Instructions: Bring a copy of your healthcare power of attorney and living will documents the day of surgery if you haven't scanned them before.               Please read over the following fact sheets you were given: IF [place] [phone] Schneiders     If you received a COVID test during your pre-op visit  it is requested that you wear a mask when out in public, stay away from anyone that may not be feeling well and notify your surgeon if you develop symptoms. If you test positive for Covid or have been in contact with anyone that has tested positive in the last 10 days please notify you surgeon.      [place] - Preparing for Surgery Before surgery, you can play an important role.  Because skin is not sterile, your skin needs to be as free of germs as possible.  You can reduce the number of germs on your skin by washing with CHG (chlorahexidine gluconate) soap before surgery.  CHG is an antiseptic cleaner which kills germs and bonds with the skin to continue killing germs even after washing. Please DO NOT use if you have an allergy to CHG or antibacterial soaps.  If your skin becomes reddened/irritated stop using the CHG and inform your nurse when you arrive at Short Stay. Do not shave (including legs and underarms) for at least 48 hours prior to the first CHG shower.  You may shave your face/neck.   Please follow these instructions carefully:              1.  Shower with CHG Soap the night before surgery and the  morning of surgery.             2.  If you choose to wash your hair, wash your hair first as usual with your normal  shampoo.             3.  After you shampoo, rinse your hair and body thoroughly to remove the shampoo.                                           4.  Use CHG as you would any other liquid soap.  You can apply chg directly to the skin and wash.  Gently with a scrungie or clean washcloth.             5.  Apply the CHG Soap to your body ONLY FROM THE NECK DOWN.   Do                not use on face/ open                           Wound or open sores. Avoid contact with eyes, ears mouth and                        genitals (private parts).  Wash face,  Genitals (private parts) with your normal soap.             6.  Wash thoroughly, paying special attention to the area where your                                     surgery  will be performed.             7.  Thoroughly rinse your body with warm water from the neck down.             8.  DO NOT shower/wash with your normal soap after using and rinsing off the CHG Soap.                9.  Pat yourself dry with a clean towel.            10.  Wear clean pajamas.            11.  Place clean sheets on your bed the night of your first shower and do not  sleep with pets. Day of Surgery : Do not apply any lotions/deodorants the morning of surgery.  Please wear clean clothes to the hospital/surgery center.   FAILURE TO FOLLOW THESE INSTRUCTIONS MAY RESULT IN THE CANCELLATION OF YOUR SURGERY   PATIENT SIGNATURE_________________________________   NURSE SIGNATURE__________________________________

## 2021-11-19 NOTE — Progress Notes (Addendum)
COVID Vaccine received:  '[x]'$  No '[]'$  Yes Date of any COVID positive Test in last 90 days: None  PCP - Grier Mitts, MD Cardiologist - [name], MD Cardiac clearance- Diona Browner, [place] 11-11-21 note Epic  Chest x-ray - 09-29-21  Epic EKG -  10-08-21 Epic Stress Test - n/a ECHO - 10-31-21  Epic Cardiac Cath - 09-24-2021  Epic CT Coronary - 11-14-21  Epic  Pacemaker/ICD device     '[x]'$  N/A Spinal Cord Stimulator:'[x]'$  No '[]'$  Yes      (Remind patient to bring remote DOS) Other Implants:   History of Sleep Apnea? '[x]'$  No '[]'$  Yes   Sleep Study Date:   CPAP used?- '[x]'$  No '[]'$  Yes  (Instruct to bring their mask & Tubing)  Does the patient monitor blood sugar? '[]'$  No '[]'$  Yes  '[x]'$  N/A  Blood Thinner Instructions: none Aspirin Instructions: ASA 81 mg,  hold 5-7 days Last Dose: Tuesday  11-18-21  ERAS Protocol Ordered: '[x]'$  No  '[]'$  Yes  Comments: Activity level: Patient can climb a flight of stairs without difficulty; '[x]'$  No CP  '[x]'$  No SOB  He performs all ADLs without difficulty.   Anesthesia review:  TAVR 09-30-21, Cath 09-24-21, CABG, CHF, RBBB, HTN, Hx of Hallucinations after anesthesia, once.   Patient denies shortness of breath, fever, cough and chest pain at PAT appointment.  Patient verbalized understanding and agreement to the Pre-Surgical Instructions that were given to them at this PAT appointment. Patient was also educated of the need to review these PAT instructions again prior to his/her surgery.I reviewed the appropriate phone numbers to call if they have any and questions or concerns.

## 2021-11-20 ENCOUNTER — Inpatient Hospital Stay (HOSPITAL_COMMUNITY)
Admission: RE | Admit: 2021-11-20 | Discharge: 2021-11-20 | Disposition: A | Discharge status: Home / Self Care | Payer: Medicare Other | Source: Ambulatory Visit

## 2021-11-20 DIAGNOSIS — I10 Essential (primary) hypertension: Secondary | ICD-10-CM

## 2021-11-20 DIAGNOSIS — I739 Peripheral vascular disease, unspecified: Secondary | ICD-10-CM | POA: Diagnosis not present

## 2021-11-21 ENCOUNTER — Encounter (HOSPITAL_COMMUNITY)
Admission: RE | Admit: 2021-11-21 | Discharge: 2021-11-21 | Disposition: A | Discharge status: Home / Self Care | Payer: Medicare Other | Source: Ambulatory Visit | Attending: Internal Medicine | Admitting: Internal Medicine

## 2021-11-21 ENCOUNTER — Encounter (HOSPITAL_COMMUNITY): Payer: Self-pay

## 2021-11-21 ENCOUNTER — Other Ambulatory Visit: Payer: Self-pay

## 2021-11-21 ENCOUNTER — Encounter (HOSPITAL_COMMUNITY)
Admission: RE | Admit: 2021-11-21 | Discharge: 2021-11-21 | Disposition: A | Discharge status: Home / Self Care | Payer: Medicare Other | Source: Ambulatory Visit | Attending: Urology | Admitting: Urology

## 2021-11-21 DIAGNOSIS — Z952 Presence of prosthetic heart valve: Secondary | ICD-10-CM | POA: Insufficient documentation

## 2021-11-21 DIAGNOSIS — Z951 Presence of aortocoronary bypass graft: Secondary | ICD-10-CM | POA: Insufficient documentation

## 2021-11-21 DIAGNOSIS — Z87891 Personal history of nicotine dependence: Secondary | ICD-10-CM | POA: Insufficient documentation

## 2021-11-21 DIAGNOSIS — Z01812 Encounter for preprocedural laboratory examination: Secondary | ICD-10-CM | POA: Insufficient documentation

## 2021-11-21 DIAGNOSIS — I2510 Atherosclerotic heart disease of native coronary artery without angina pectoris: Secondary | ICD-10-CM | POA: Insufficient documentation

## 2021-11-21 DIAGNOSIS — I10 Essential (primary) hypertension: Secondary | ICD-10-CM | POA: Insufficient documentation

## 2021-11-21 DIAGNOSIS — D494 Neoplasm of unspecified behavior of bladder: Secondary | ICD-10-CM | POA: Insufficient documentation

## 2021-11-21 DIAGNOSIS — I4510 Unspecified right bundle-branch block: Secondary | ICD-10-CM | POA: Insufficient documentation

## 2021-11-21 LAB — BASIC METABOLIC PANEL
Anion gap: 8 (ref 5–15)
BUN: 34 mg/dL — ABNORMAL HIGH (ref 8–23)
CO2: 23 mmol/L (ref 22–32)
Calcium: 8.8 mg/dL — ABNORMAL LOW (ref 8.9–10.3)
Chloride: 106 mmol/L (ref 98–111)
Creatinine, Ser: 1.13 mg/dL (ref 0.61–1.24)
GFR, Estimated: >60 mL/min (ref >60)
Glucose, Bld: 103 mg/dL — ABNORMAL HIGH (ref 70–99)
Potassium: 4.1 mmol/L (ref 3.5–5.1)
Sodium: 137 mmol/L (ref 135–145)

## 2021-11-21 LAB — CBC
HCT: 35.6 % — ABNORMAL LOW (ref 39.0–52.0)
Hemoglobin: 10.9 g/dL — ABNORMAL LOW (ref 13.0–17.0)
MCH: 31.2 pg (ref 26.0–34.0)
MCHC: 30.6 g/dL (ref 30.0–36.0)
MCV: 102 fL — ABNORMAL HIGH (ref 80.0–100.0)
Platelets: 199 10*3/uL (ref 150–400)
RBC: 3.49 MIL/uL — ABNORMAL LOW (ref 4.22–5.81)
RDW: 15.4 % (ref 11.5–15.5)
WBC: 8.9 10*3/uL (ref 4.0–10.5)
nRBC: 0 % (ref 0.0–0.2)

## 2021-11-21 NOTE — Progress Notes (Signed)
Anesthesia Chart Review   Case: [id number] Date/Time: 11/24/21 1145   Procedure: TRANSURETHRAL RESECTION OF BLADDER TUMOR (TURBT) BILATERAL RETROGRADE PYELOGRAM (Bilateral) - 9 MINS FOR CASE   Anesthesia type: General   Pre-op diagnosis: BLADDER TUMOR   Location: [facility] PROC[facility]   Surgeons: Lucas Mallow, MD       DISCUSSION:81 y.o. 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 Link Snuffer.   Pt last seen by cardio 10/31/2021. "Severe AS s/p VIV TAVR: echo today shows EF 50-55%, s/p TAVR with a mean gradient of 28 mm hg (up from ~79m hg and worsening PVL. Discussed case with the team and plan to do cardiac CT to r/o thrombus. We feel the increase in gradients are most likely 2/2 increased flow from AI. Fortunately, he has had a big clinical improvement with NYHA class I symptoms. Will continue clinical surveillance if CT does not show thrombus. Continue on aspirin. Patient understands he must continue lifelong SBE prophylaxis."  CT 11/11/2021 1. No evidence of HALT or HAM on TAVR prosthesis to explain increase in gradients. Normal leaflet thickness. No thrombus or pannus.   2. No evidence of valve dehiscence. TAVR prosthesis is well seated and well apposed to the prior surgical aortic valve prosthesis. No CT findings to suggest source of paravalvular regurgitation.  Per cardiology will continue to monitor based on these results.   Pr cardiology preoperative evaluation 11/12/2021, "Chart reviewed as part of pre-operative protocol coverage. Given past medical history and time since last visit, based on ACC/AHA guidelines, DOhm Dentlerwould be at acceptable risk for the planned procedure without further cardiovascular testing. He is able to complete > 4 METS without difficulty.    The patient was advised that if he develops new symptoms prior to surgery to contact our office to arrange for a  follow-up visit, and he verbalized understanding.   Per office protocol, he may hold Aspirin for 5-7 days prior to procedure. Please resume Aspirin as soon as possible postprocedure, at the discretion of the surgeon."  Anticipate pt can proceed with planned procedure barring acute status change.   VS: There were no vitals taken for this visit.  PROVIDERS: BBillie Ruddy MD is PCP   Cardiologist - PDorris Carnes MD LABS: Labs reviewed: Acceptable for surgery. (all labs ordered are listed, but only abnormal results are displayed)  Labs Reviewed - No data to display   IMAGES: CT Coronary 11/11/2021 IMPRESSION: 1. No evidence of HALT or HAM on TAVR prosthesis to explain increase in gradients. Normal leaflet thickness. No thrombus or pannus.   2. No evidence of valve dehiscence. TAVR prosthesis is well seated and well apposed to the prior surgical aortic valve prosthesis. No CT findings to suggest source of paravalvular regurgitation.  EKG:   CV: Echo 10/31/2021 1. Left ventricular ejection fraction, by estimation, is 50 to 55%. The  left ventricle has low normal function. The left ventricle demonstrates  global hypokinesis. There is moderate left ventricular hypertrophy. Left  ventricular diastolic parameters are   consistent with Grade II diastolic dysfunction (pseudonormalization).  Elevated left atrial pressure.   2. Right ventricular systolic function is normal. The right ventricular  size is mildly enlarged.   3. Left atrial size was moderately dilated.   4. The mitral valve is degenerative. Mild to moderate mitral valve  regurgitation.   5. Tricuspid valve regurgitation is mild to moderate.  6. The major aortic regurgitant jet appears to be perivalvular at 2-3  o'clock, but there may be an intravalvular component. Aortic valve  gradients are higher, probably in part due to AI-related increased stroke  volume. However, the dimensionless index  has decreased, which may  signify a true reduction in the effective valve  area. The aortic valve has been repaired/replaced. Aortic valve  regurgitation is moderate. There is a Edwards valve in valve present in  the aortic position. Procedure Date:  09/30/2021. Aortic regurgitation PHT measures 354 msec. Aortic valve mean  gradient measures 28.0 mmHg. Aortic valve Vmax measures 3.64 m/s. Aortic  valve acceleration time measures 100 msec.  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 TAli Loweand Dr. BCyndia Bent  Thyroid disease     Past Surgical History:  Procedure Laterality Date   AORTIC VALVE REPLACEMENT  2009   [place]   [facility]   SVG-PDA w/ AVR   CORRECTION HAMMER TOE  2011   BILATERAL   GASTROCNEMIUS RECESSION  05/28/2011   INTRAOPERATIVE TRANSTHORACIC ECHOCARDIOGRAM N/A 09/30/2021   Procedure: INTRAOPERATIVE TRANSTHORACIC ECHOCARDIOGRAM;  Surgeon: TEarly Osmond MD;  [facility];  Service: Open Heart Surgery;  Laterality: N/A;   POLYPECTOMY     RIGHT/LEFT HEART CATH AND CORONARY/GRAFT ANGIOGRAPHY N/A 09/24/2021   Procedure: RIGHT/LEFT HEART CATH AND CORONARY/GRAFT ANGIOGRAPHY;  Surgeon: TEarly Osmond MD;  [facility];  Service: Cardiovascular;  Laterality: N/A;   TONSILLECTOMY     TOTAL HIP ARTHROPLASTY Left 2000   TOTAL KNEE ARTHROPLASTY  2000   left   TOTAL KNEE ARTHROPLASTY  12/01/2011    Procedure: TOTAL KNEE ARTHROPLASTY;  Surgeon: MMauri Pole MD;  Location: [facility];  Service: Orthopedics;  Laterality: Right;   TRANSCATHETER AORTIC VALVE REPLACEMENT, TRANSFEMORAL Right 09/30/2021   Procedure: Transcatheter Aortic Valve Replacement, Transfemoral;  Surgeon: TEarly Osmond MD;  [facility];  Service: Open Heart Surgery;  Laterality: Right;   WEIL OSTEOTOMY  05/28/2011    MEDICATIONS:  amiodarone (PACERONE) 200 MG tablet   amLODipine (NORVASC) 5 MG tablet   aspirin EC 81 MG tablet   atorvastatin (LIPITOR) 80 MG tablet   carvedilol (COREG) 6.25 MG tablet   doxycycline (VIBRA-TABS) 100 MG tablet   ezetimibe (ZETIA) 10 MG tablet   furosemide (LASIX) 20 MG tablet   levothyroxine (SYNTHROID) 50 MCG tablet   Multiple Vitamins-Minerals (PRESERVISION AREDS 2) CAPS   sacubitril-valsartan (ENTRESTO) 24-26 MG   tamsulosin (FLOMAX) 0.4 MG CAPS capsule   No current facility-administered medications for this encounter.    JKonrad FelixWard, PA-C [facility]-Surgical [facility] [phone]

## 2021-11-21 NOTE — Anesthesia Preprocedure Evaluation (Addendum)
Anesthesia Evaluation  Patient identified by MRN, date of birth, ID band Patient awake    Reviewed: Allergy & Precautions, NPO status , Patient's Chart, lab work & pertinent test results, reviewed documented beta blocker date and time   History of Anesthesia Complications Negative for: history of anesthetic complications  Airway Mallampati: II  TM Distance: >3 FB Neck ROM: Full    Dental  (+) Missing,    Pulmonary former smoker,    Pulmonary exam normal        Cardiovascular hypertension, Pt. on medications and Pt. 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 Aortic valve gradients are higher, probably in part due to AI-related increased stroke volume. However, the dimensionless index has decreased, which may signify a true reduction in the effective valve area. The aortic valve has been repaired/replaced. Aortic valve regurgitation is moderate. There is a Edwards valve in valve present in the aortic position. Procedure Date: 09/30/2021. Aortic regurgitation PHT measures 354 msec. Aortic valve mean gradient measures 28.0 mmHg. Aortic valve Vmax measures 3.64 m/s. Aortic valve acceleration time measures 100 msec.   Neuro/Psych    GI/Hepatic hiatal hernia,   Endo/Other  Hypothyroidism   Renal/GU      Musculoskeletal  (+) Arthritis ,   Abdominal   Peds  Hematology  (+) Blood dyscrasia (Hgb 10.9), anemia ,   Anesthesia Other Findings   Reproductive/Obstetrics                          Anesthesia Physical Anesthesia Plan  ASA: 3  Anesthesia Plan: General   Post-op Pain Management: Tylenol  PO (pre-op)*   Induction: Intravenous  PONV Risk Score and Plan: 2 and Treatment may vary due to age or medical condition, Dexamethasone and Ondansetron  Airway Management Planned: LMA  Additional Equipment: None  Intra-op Plan:   Post-operative Plan: Extubation in OR  Informed Consent: I have reviewed the patients History and Physical, chart, labs and discussed the procedure including the risks, benefits and alternatives for the proposed anesthesia with the patient or authorized representative who has indicated his/her understanding and acceptance.     Dental advisory given  Plan Discussed with: CRNA  Anesthesia Plan Comments: (See PAT note 11/21/2021)      Anesthesia Quick Evaluation

## 2021-11-24 ENCOUNTER — Ambulatory Visit (HOSPITAL_COMMUNITY)
Admission: RE | Admit: 2021-11-24 | Discharge: 2021-11-24 | Disposition: A | Discharge status: Home / Self Care | Payer: Medicare Other | Attending: Urology | Admitting: Urology

## 2021-11-24 ENCOUNTER — Other Ambulatory Visit: Payer: Self-pay

## 2021-11-24 ENCOUNTER — Encounter (HOSPITAL_COMMUNITY): Payer: Self-pay | Admitting: Urology

## 2021-11-24 ENCOUNTER — Ambulatory Visit (HOSPITAL_COMMUNITY): Payer: Medicare Other | Admitting: Physician Assistant

## 2021-11-24 ENCOUNTER — Ambulatory Visit (HOSPITAL_COMMUNITY): Payer: Medicare Other

## 2021-11-24 ENCOUNTER — Encounter (HOSPITAL_COMMUNITY)
Admission: RE | Disposition: A | Discharge status: Home / Self Care | Payer: Self-pay | Source: Home / Self Care | Attending: Urology

## 2021-11-24 ENCOUNTER — Ambulatory Visit (HOSPITAL_BASED_OUTPATIENT_CLINIC_OR_DEPARTMENT_OTHER): Payer: Medicare Other | Admitting: Anesthesiology

## 2021-11-24 ENCOUNTER — Encounter (HOSPITAL_COMMUNITY): Payer: Medicare Other

## 2021-11-24 ENCOUNTER — Encounter: Payer: Self-pay | Admitting: *Deleted

## 2021-11-24 ENCOUNTER — Telehealth: Payer: Self-pay | Admitting: *Deleted

## 2021-11-24 DIAGNOSIS — E039 Hypothyroidism, unspecified: Secondary | ICD-10-CM | POA: Insufficient documentation

## 2021-11-24 DIAGNOSIS — K449 Diaphragmatic hernia without obstruction or gangrene: Secondary | ICD-10-CM | POA: Diagnosis not present

## 2021-11-24 DIAGNOSIS — M1990 Unspecified osteoarthritis, unspecified site: Secondary | ICD-10-CM | POA: Diagnosis not present

## 2021-11-24 DIAGNOSIS — I351 Nonrheumatic aortic (valve) insufficiency: Secondary | ICD-10-CM | POA: Diagnosis not present

## 2021-11-24 DIAGNOSIS — D494 Neoplasm of unspecified behavior of bladder: Secondary | ICD-10-CM | POA: Diagnosis not present

## 2021-11-24 DIAGNOSIS — I10 Essential (primary) hypertension: Secondary | ICD-10-CM | POA: Insufficient documentation

## 2021-11-24 DIAGNOSIS — D638 Anemia in other chronic diseases classified elsewhere: Secondary | ICD-10-CM | POA: Diagnosis not present

## 2021-11-24 DIAGNOSIS — I2510 Atherosclerotic heart disease of native coronary artery without angina pectoris: Secondary | ICD-10-CM | POA: Diagnosis not present

## 2021-11-24 DIAGNOSIS — Z87891 Personal history of nicotine dependence: Secondary | ICD-10-CM | POA: Insufficient documentation

## 2021-11-24 DIAGNOSIS — Z952 Presence of prosthetic heart valve: Secondary | ICD-10-CM | POA: Diagnosis not present

## 2021-11-24 DIAGNOSIS — R310 Gross hematuria: Secondary | ICD-10-CM | POA: Insufficient documentation

## 2021-11-24 DIAGNOSIS — Z87440 Personal history of urinary (tract) infections: Secondary | ICD-10-CM | POA: Insufficient documentation

## 2021-11-24 DIAGNOSIS — Z951 Presence of aortocoronary bypass graft: Secondary | ICD-10-CM | POA: Diagnosis not present

## 2021-11-24 DIAGNOSIS — N401 Enlarged prostate with lower urinary tract symptoms: Secondary | ICD-10-CM | POA: Diagnosis not present

## 2021-11-24 DIAGNOSIS — D630 Anemia in neoplastic disease: Secondary | ICD-10-CM | POA: Diagnosis not present

## 2021-11-24 DIAGNOSIS — R338 Other retention of urine: Secondary | ICD-10-CM | POA: Insufficient documentation

## 2021-11-24 DIAGNOSIS — N3020 Other chronic cystitis without hematuria: Secondary | ICD-10-CM | POA: Diagnosis not present

## 2021-11-24 DIAGNOSIS — D649 Anemia, unspecified: Secondary | ICD-10-CM | POA: Insufficient documentation

## 2021-11-24 DIAGNOSIS — D303 Benign neoplasm of bladder: Secondary | ICD-10-CM | POA: Diagnosis not present

## 2021-11-24 HISTORY — PX: TRANSURETHRAL RESECTION OF BLADDER TUMOR: SHX2575

## 2021-11-24 SURGERY — TURBT (TRANSURETHRAL RESECTION OF BLADDER TUMOR)
Anesthesia: General | Site: Bladder | Laterality: Bilateral

## 2021-11-24 MED ORDER — 0.9 % SODIUM CHLORIDE (POUR BTL) OPTIME
TOPICAL | Status: DC | PRN
  Administered 2021-11-24: 1000 mL

## 2021-11-24 MED ORDER — SUGAMMADEX SODIUM 500 MG/5ML IV SOLN
INTRAVENOUS | Status: AC
  Filled 2021-11-24: qty 35

## 2021-11-24 MED ORDER — ORAL CARE MOUTH RINSE: 15.0000 mL | Freq: Once | OROMUCOSAL | Status: AC

## 2021-11-24 MED ORDER — DEXAMETHASONE SODIUM PHOSPHATE 10 MG/ML IJ SOLN
INTRAMUSCULAR | Status: AC
  Filled 2021-11-24: qty 1

## 2021-11-24 MED ORDER — PROPOFOL 10 MG/ML IV BOLUS
INTRAVENOUS | Status: AC
  Filled 2021-11-24: qty 20

## 2021-11-24 MED ORDER — IOHEXOL 300 MG/ML  SOLN
INTRAMUSCULAR | Status: DC | PRN
  Administered 2021-11-24: 20 mL via URETHRAL

## 2021-11-24 MED ORDER — FENTANYL CITRATE (PF) 100 MCG/2ML IJ SOLN
INTRAMUSCULAR | Status: AC
  Filled 2021-11-24: qty 2

## 2021-11-24 MED ORDER — LIDOCAINE HCL (CARDIAC) PF 100 MG/5ML IV SOSY
PREFILLED_SYRINGE | INTRAVENOUS | Status: DC | PRN
  Administered 2021-11-24: 100 mg via INTRAVENOUS

## 2021-11-24 MED ORDER — SUCCINYLCHOLINE CHLORIDE 200 MG/10ML IV SOSY
PREFILLED_SYRINGE | INTRAVENOUS | Status: AC
  Filled 2021-11-24: qty 10

## 2021-11-24 MED ORDER — EPHEDRINE SULFATE (PRESSORS) 50 MG/ML IJ SOLN
INTRAMUSCULAR | Status: DC | PRN
  Administered 2021-11-24: 10 mg via INTRAVENOUS
  Administered 2021-11-24: 5 mg via INTRAVENOUS

## 2021-11-24 MED ORDER — FENTANYL CITRATE PF 50 MCG/ML IJ SOSY: 25.0000 ug | PREFILLED_SYRINGE | INTRAMUSCULAR | Status: DC | PRN

## 2021-11-24 MED ORDER — OXYCODONE HCL 5 MG PO TABS: 5.0000 mg | ORAL_TABLET | Freq: Once | ORAL | Status: DC | PRN

## 2021-11-24 MED ORDER — GLYCOPYRROLATE 0.2 MG/ML IJ SOLN
INTRAMUSCULAR | Status: AC
  Filled 2021-11-24: qty 2

## 2021-11-24 MED ORDER — SODIUM CHLORIDE 0.9 % IR SOLN
Status: DC | PRN
  Administered 2021-11-24: 2000 mL via INTRAVESICAL

## 2021-11-24 MED ORDER — EPHEDRINE 5 MG/ML INJ
INTRAVENOUS | Status: AC
  Filled 2021-11-24: qty 5

## 2021-11-24 MED ORDER — FENTANYL CITRATE (PF) 100 MCG/2ML IJ SOLN
INTRAMUSCULAR | Status: DC | PRN
  Administered 2021-11-24: 50 ug via INTRAVENOUS

## 2021-11-24 MED ORDER — PROPOFOL 10 MG/ML IV BOLUS
INTRAVENOUS | Status: DC | PRN
  Administered 2021-11-24: 150 mg via INTRAVENOUS

## 2021-11-24 MED ORDER — SUCCINYLCHOLINE CHLORIDE 200 MG/10ML IV SOSY
PREFILLED_SYRINGE | INTRAVENOUS | Status: DC | PRN
  Administered 2021-11-24: 30 mg via INTRAVENOUS

## 2021-11-24 MED ORDER — CHLORHEXIDINE GLUCONATE 0.12 % MT SOLN
15.0000 mL | Freq: Once | OROMUCOSAL | Status: AC
Start: 2021-11-24 — End: 2021-11-24
  Administered 2021-11-24: 15 mL via OROMUCOSAL

## 2021-11-24 MED ORDER — ACETAMINOPHEN 500 MG PO TABS
1000.0000 mg | ORAL_TABLET | Freq: Once | ORAL | Status: AC
  Administered 2021-11-24: 1000 mg via ORAL
  Filled 2021-11-24: qty 2

## 2021-11-24 MED ORDER — DEXAMETHASONE SODIUM PHOSPHATE 10 MG/ML IJ SOLN
INTRAMUSCULAR | Status: DC | PRN
  Administered 2021-11-24: 4 mg via INTRAVENOUS

## 2021-11-24 MED ORDER — PHENYLEPHRINE 80 MCG/ML (10ML) SYRINGE FOR IV PUSH (FOR BLOOD PRESSURE SUPPORT)
PREFILLED_SYRINGE | INTRAVENOUS | Status: AC
  Filled 2021-11-24: qty 10

## 2021-11-24 MED ORDER — ONDANSETRON HCL 4 MG/2ML IJ SOLN
INTRAMUSCULAR | Status: DC | PRN
  Administered 2021-11-24: 4 mg via INTRAVENOUS

## 2021-11-24 MED ORDER — LIDOCAINE HCL (PF) 2 % IJ SOLN
INTRAMUSCULAR | Status: AC
  Filled 2021-11-24: qty 5

## 2021-11-24 MED ORDER — PHENYLEPHRINE HCL-NACL 20-0.9 MG/250ML-% IV SOLN
INTRAVENOUS | Status: DC | PRN
  Administered 2021-11-24: 40 ug/min via INTRAVENOUS

## 2021-11-24 MED ORDER — TRAMADOL HCL 50 MG PO TABS: 50.0000 mg | ORAL_TABLET | Freq: Four times a day (QID) | ORAL | 0 refills | Status: DC | PRN

## 2021-11-24 MED ORDER — LACTATED RINGERS IV SOLN: INTRAVENOUS | Status: DC

## 2021-11-24 MED ORDER — CEFAZOLIN SODIUM-DEXTROSE 2-4 GM/100ML-% IV SOLN
2.0000 g | INTRAVENOUS | Status: AC
  Administered 2021-11-24: 2 g via INTRAVENOUS
  Filled 2021-11-24: qty 100

## 2021-11-24 MED ORDER — GLYCOPYRROLATE 0.2 MG/ML IJ SOLN
INTRAMUSCULAR | Status: DC | PRN
  Administered 2021-11-24 (×2): .2 mg via INTRAVENOUS

## 2021-11-24 MED ORDER — OXYCODONE HCL 5 MG/5ML PO SOLN: 5.0000 mg | Freq: Once | ORAL | Status: DC | PRN

## 2021-11-24 SURGICAL SUPPLY — 20 items
BAG URINE DRAIN 2000ML AR STRL (UROLOGICAL SUPPLIES) IMPLANT
BAG URO CATCHER STRL LF (MISCELLANEOUS) ×1 IMPLANT
CATH FOLEY 2WAY SLVR  5CC 18FR (CATHETERS)
CATH FOLEY 2WAY SLVR 5CC 18FR (CATHETERS) IMPLANT
CATH URETL OPEN 5X70 (CATHETERS) IMPLANT
DRAPE FOOT SWITCH (DRAPES) ×1 IMPLANT
ELECT REM PT RETURN 15FT ADLT (MISCELLANEOUS) ×1 IMPLANT
GLOVE BIO SURGEON STRL SZ7.5 (GLOVE) ×1 IMPLANT
GOWN STRL REUS W/ TWL XL LVL3 (GOWN DISPOSABLE) ×1 IMPLANT
GOWN STRL REUS W/TWL XL LVL3 (GOWN DISPOSABLE) ×1
GUIDEWIRE STR DUAL SENSOR (WIRE) IMPLANT
KIT TURNOVER KIT A (KITS) IMPLANT
LOOP CUT BIPOLAR 24F LRG (ELECTROSURGICAL) IMPLANT
MANIFOLD NEPTUNE II (INSTRUMENTS) ×1 IMPLANT
PACK CYSTO (CUSTOM PROCEDURE TRAY) ×1 IMPLANT
PLUG CATH AND CAP STER (CATHETERS) IMPLANT
SYR TOOMEY IRRIG 70ML (MISCELLANEOUS)
SYRINGE TOOMEY IRRIG 70ML (MISCELLANEOUS) IMPLANT
TUBING CONNECTING 10 (TUBING) ×1 IMPLANT
TUBING UROLOGY SET (TUBING) ×1 IMPLANT

## 2021-11-24 NOTE — H&P (Signed)
CC/HPI: CC: Malodorous urine, questionable recurrent UTI  HPI:  11/19/2020  81 year old male with intermittent malodorous urine in the mornings. He was prescribed 5 days of antibiotics and it improved his urine odor. Urine culture was performed at that time that showed mixed urogenital flora. He denies any other symptoms including frequency, urgency, dysuria. Urine not consistent with infection today. He is asymptomatic today but thinks that the odor may be coming back. He does have some incomplete bladder emptying with a PVR of 155. He has urgency and frequency but takes Lasix. He has nocturia x2. Denies obstructive voiding complaints. His daughter is with him and very concerned about possible recurrent UTI. He denies any hematuria. There was 2+ blood on his urinalysis with his primary care physician but no microscopy was performed. No microscopic hematuria today.   05/09/2021  PVR remains elevated at 279 cc. Patient was necessarily on tamsulosin but then stopped it. Unclear reasons why. Was not having side effects. He started Lasix and attributes his frequency and urgency to this. No known urinary tract infection since last seeing him. He is asymptomatic. Does have some rare bacteria.   09/01/2021  Patient returns today with 1 day history of worsening urinary symptoms including frequency every 30 minutes, urgency. Urinalysis consistent with possible UTI. Has microscopic hematuria. Has had urinary tract infections in the past. Prior to this, he had actually been doing a little bit better. Still nocturia x2 but this is decreased from about 4 times per night. Felt like his voiding symptoms were better. PVR is less than it had been at prior visits prior to starting tamsulosin and is 29.   10/13/2021: Back today for follow-up exam. At time of last office visit, the patient was treated empirically for suspected UTI with Augmentin. Urine culture ultimately positive and sensitive to the antibiotic prescribed. He  was also scheduled for a CT hematuria protocol evaluation and follow-up exam with cystoscopy. Since then the patient has been hospitalized due to exacerbation of underlying cardiovascular disease. He underwent CT imaging of the chest abdomen and pelvis during hospitalization earlier this month. 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 CT imaging did not show any obvious abnormalities of the kidneys, ureters or bladder. He had a simple appearing exophytic cyst on the right side but there were no other concerning mass/lesion, obstructive signs or evidence of nephrolithiasis.   Today he remains on tamsulosin. Voiding symptoms are grossly stable at present time. Denies bothersome increase in frequency/urgency, new or worsening ability to start or stop his stream, increased nocturia from baseline. He is not having any dysuria or painful/burning urination. Reason for today's appointment beginning approximately 5 days ago he began noticing gross hematuria in his urine stream. Not associated with any clot or tissue material passage. He denies any lower back or flank pain/discomfort suggestive of obstructive uropathy. Denies any correlating fevers or chills, nausea/vomiting. He takes a baby aspirin but not on any other type of anticoagulation therapy. He states he has recovered well from his TAVR not having any chest pain, shortness of breath or increased swelling in the lower extremities.   11/06/2021  Patient has not had any further gross hematuria. He presents for cystoscopy.     ALLERGIES: Sulfa    MEDICATIONS: Tamsulosin Hcl 0.4 mg capsule 1 capsule PO Daily  Amiodarone Hcl 200 mg tablet  Amlodipine-Olmesartan  Atorvastatin Calcium 80 mg tablet  Carvedilol  Entresto  Ezetimibe  Furosemide  Levothyroxine  GU PSH: None     PSH Notes: heart surgery     NON-GU PSH: Foot surgery (unspecified) Hip Replacement, Left Knee replacement,  Bilateral         GU PMH: BPH w/LUTS - 10/13/2021, - 09/01/2021, - 05/09/2021, - 11/19/2020 Gross hematuria - 10/13/2021 Acute Cystitis/UTI - 09/01/2021 Incomplete bladder emptying - 09/01/2021, - 05/09/2021, - 11/19/2020 Microscopic hematuria - 09/01/2021 Nocturia - 09/01/2021, - 05/09/2021, - 11/19/2020 Urinary Frequency - 09/01/2021, - 05/09/2021 Urinary Urgency - 09/01/2021, - 05/09/2021    NON-GU PMH: Heart disease, unspecified Hypercholesterolemia Hypertension    FAMILY HISTORY: 2 daughters - Other 1 son - Other   SOCIAL HISTORY: Marital Status: Married Preferred Language: English; Race: White Current Smoking Status: Patient does not smoke anymore. Has not smoked since 11/16/1980.  <DIV'  Tobacco Use Assessment Completed:  Used Tobacco in last 30 days?   Drinks 1 caffeinated drink per day.    REVIEW OF SYSTEMS:    GU Review Male:  Patient denies frequent urination, hard to postpone urination, burning/ pain with urination, get up at night to urinate, leakage of urine, stream starts and stops, trouble starting your stream, have to strain to urinate , erection problems, and penile pain.   Gastrointestinal (Upper):  Patient denies nausea, vomiting, and indigestion/ heartburn.   Gastrointestinal (Lower):  Patient denies constipation and diarrhea.   Constitutional:  Patient denies fever, night sweats, weight loss, and fatigue.   Skin:  Patient denies skin rash/ lesion and itching.   Eyes:  Patient denies blurred vision and double vision.   Ears/ Nose/ Throat:  Patient denies sore throat and sinus problems.   Hematologic/Lymphatic:  Patient denies swollen glands and easy bruising.   Cardiovascular:  Patient denies leg swelling and chest pains.   Respiratory:  Patient denies cough and shortness of breath.   Endocrine:  Patient denies excessive thirst.   Musculoskeletal:  Patient denies back pain and joint pain.   Neurological:  Patient denies headaches and dizziness.   Psychologic:  Patient  denies depression and anxiety.   VITAL SIGNS: None   GU PHYSICAL EXAMINATION:    Penis: Circumcised, no warts, no cracks. No dorsal Peyronie's plaques, no left corporal Peyronie's plaques, no right corporal Peyronie's plaques, no scarring, no warts. No balanitis, no meatal stenosis.   MULTI-SYSTEM PHYSICAL EXAMINATION:    Gastrointestinal: No mass, no tenderness, no rigidity, non obese abdomen.        Complexity of Data:   Source Of History:  Patient  Records Review:  Previous Doctor Records, Previous Patient Records  Urine Test Review:  Urinalysis  X-Ray Review: C.T. Abdomen/Pelvis: Reviewed Films. Reviewed Report. Discussed With Patient.     PROCEDURES:    Flexible Cystoscopy - 52000  Risks, benefits, and some of the potential complications of the procedure were discussed at length with the patient including infection, bleeding, voiding discomfort, urinary retention, fever, chills, sepsis, and others. All questions were answered. Informed consent was obtained. Antibiotic prophylaxis was given. Sterile technique and intraurethral analgesia were used.  Meatus:  Normal size. Normal location. Normal condition.  Urethra:  No strictures.  External Sphincter:  Normal.  Verumontanum:  Normal.  Prostate:  Borderline obstructing. Moderate hyperplasia.  Bladder Neck:  Non-obstructing.  Ureteral Orifices:  Normal location. Normal size. Normal shape.   Bladder:  No trabeculation. No tumors. Along the right posterior bladder wall just lateral to the ureteral orifice he had raised slightly papillary appearing cobblestone erythema mucosa. Concerning for bladder cancer.  The lower urinary tract was carefully examined. The procedure was well-tolerated and without complications. Antibiotic instructions were given. Instructions were given to call the office immediately for bloody urine, difficulty urinating, urinary retention, painful or frequent urination, fever, chills, nausea, vomiting or other  illness. The patient stated that he understood these instructions and would comply with them.    Urinalysis w/Scope  Dipstick Dipstick Cont'd Micro  Color: Yellow Bilirubin: Neg mg/dL WBC/hpf: 20 - 40/hpf  Appearance: Slightly Cloudy Ketones: Neg mg/dL RBC/hpf: NS (Not Seen)  Specific Gravity: 1.025 Blood: Neg ery/uL Bacteria: Many (>50/hpf)  pH: <=5.0 Protein: Neg mg/dL Cystals: NS (Not Seen)  Glucose: Neg mg/dL Urobilinogen: 0.2 mg/dL Casts: NS (Not Seen)   Nitrites: Neg Trichomonas: Not Present   Leukocyte Esterase: 3+ leu/uL Mucous: Present    Epithelial Cells: 0 - 5/hpf    Yeast: NS (Not Seen)    Sperm: Not Present    ASSESSMENT:     ICD-10 Details  1 GU:  Bladder tumor/neoplasm - D41.4 Undiagnosed New Problem  2  Gross hematuria - R31.0 Chronic, Stable, Improving   PLAN:   Orders  Labs Urine Culture, Urine Cytology  Document  Letter(s):  Created for Patient: Clinical Summary   Notes:  Plan for cystoscopy with bilateral retrograde pyelogram with transurethral resection of bladder tumor. Does not definitively look like malignancy so do not plan for intravesical chemotherapy. Risk benefits discussed including but not limited to bleeding, infection, injury to surrounding structures, bladder perforation, need for prolonged catheter, need for additional procedures.   He would need cardiology clearance.   CC: Dr. Volanda Napoleon  Dr. Harrington Challenger   Signed by Link Snuffer, III, M.D. on 11/06/21 at 2:45 PM (EDT

## 2021-11-24 NOTE — Anesthesia Procedure Notes (Signed)
Procedure Name: LMA Insertion Date/Time: 11/24/2021 11:28 AM  Performed by: Garrel Ridgel, CRNAPre-anesthesia Checklist: Patient identified, Emergency Drugs available, Suction available and Patient being monitored Patient Re-evaluated:Patient Re-evaluated prior to induction Oxygen Delivery Method: Circle system utilized Preoxygenation: Pre-oxygenation with 100% oxygen Induction Type: IV induction Ventilation: Mask ventilation without difficulty LMA: LMA inserted LMA Size: 4.0 Tube type: Oral Number of attempts: 1 Placement Confirmation: positive ETCO2 and breath sounds checked- equal and bilateral Tube secured with: Tape Dental Injury: Teeth and Oropharynx as per pre-operative assessment

## 2021-11-24 NOTE — Anesthesia Postprocedure Evaluation (Signed)
Anesthesia Post Note  Patient: Luis Herrera  Procedure(s) Performed: TRANSURETHRAL RESECTION OF BLADDER TUMOR (TURBT) BILATERAL RETROGRADE PYELOGRAM (Bilateral: Bladder)     Patient location during evaluation: PACU Anesthesia Type: General Level of consciousness: patient cooperative and awake Pain management: pain level controlled Vital Signs Assessment: post-procedure vital signs reviewed and stable Respiratory status: spontaneous breathing, nonlabored ventilation and respiratory function stable Cardiovascular status: blood pressure returned to baseline and stable Postop Assessment: no apparent nausea or vomiting Anesthetic complications: no   No notable events documented.  Last Vitals:  Vitals:   11/24/21 1215 11/24/21 1230  BP: (!) 119/57 (!) 129/58  Pulse: 71 71  Resp: 14 18  Temp:  36.5 C  SpO2: 98% 99%    Last Pain:  Vitals:   11/24/21 1230  TempSrc:   PainSc: 0-No pain                 Jassmine Vandruff

## 2021-11-24 NOTE — Discharge Instructions (Addendum)

## 2021-11-24 NOTE — Op Note (Signed)
Operative Note  Preoperative diagnosis:  1.  Bladder tumor  Postoperative diagnosis: 1.  Bladder tumor--small  Procedure(s): 1.  Cystoscopy with bilateral retrograde pyelogram, transurethral resection of bladder tumor-a small  Surgeon: Link Snuffer, MD  Assistants: None  Anesthesia: General  Complications: None immediate  EBL: Minimal  Specimens: 1.  Bladder tumor  Drains/Catheters: 1.  None  Intraoperative findings: 1.  Normal anterior urethra 2.  Borderline obstructing prostate 3.  Right retrograde pyelogram revealed no filling defect and no hydronephrosis. 4.  Left retrograde pyelogram with no hydronephrosis.  No obvious filling defects but did have some air bubbles within the ureter from the retrograde. 5.  Approximately 1 cm area of raised slightly papillary appearing mucosa on the right posterior lateral wall.  Completely resected.  Indication: 81 year old male underwent evaluation for gross hematuria and was found to have a bladder lesion.  He presents for the previously mentioned operation.  Description of procedure:  The patient was identified and consent was obtained.  The patient was taken to the operating room and placed in the supine position.  The patient was placed under general anesthesia.  Perioperative antibiotics were administered.  The patient was placed in dorsal lithotomy.  Patient was prepped and draped in a standard sterile fashion and a timeout was performed.  A 23 French rigid cystoscope was advanced into the urethra and into the bladder.  Complete cystoscopy was performed with findings noted above.  The right ureter was cannulated with an open-ended ureteral catheter and a retrograde pyelogram was performed with no abnormal findings.  I was unable to cannulate the left ureteral orifice and therefore I advanced a wire up the left ureter and then was able to advance the open-ended ureteral catheter over this into the distal ureter.  Wire was withdrawn.   Retrograde pyelogram was performed with findings noted above.  There was some air bubbles with the retrograde pyelogram somewhat limiting complete evaluation but within limitation I did not identify an obvious abnormal filling defect.  I drained the bladder and withdrew the scope.  A 26 French resectoscope with visual obturator in place was advanced into the urethra and into the bladder.  I exchanged for the bipolar working element.  I resected the abnormal mucosa and collected this for specimen.  I fulgurated the biopsy bed.  Total area of resection was about 1 cm.  There was no evidence of any bladder perforation.  No active bleeding at the conclusion of the case.  I drained the bladder and withdrew the scope.  Patient tolerated the procedure well was stable postoperative.  Plan: Follow-up in 1 week for pathology review.

## 2021-11-24 NOTE — Transfer of Care (Signed)
Immediate Anesthesia Transfer of Care Note  Patient: Luis Herrera  Procedure(s) Performed: TRANSURETHRAL RESECTION OF BLADDER TUMOR (TURBT) BILATERAL RETROGRADE PYELOGRAM (Bilateral: Bladder)  Patient Location: PACU  Anesthesia Type:General  Level of Consciousness: awake, alert , oriented and patient cooperative  Airway & Oxygen Therapy: Patient Spontanous Breathing and Patient connected to face mask oxygen  Post-op Assessment: Report given to RN and Post -op Vital signs reviewed and stable  Post vital signs: Reviewed and stable  Last Vitals:  Vitals Value Taken Time  BP 106/60 11/24/21 1209  Temp    Pulse 74 11/24/21 1212  Resp 15 11/24/21 1212  SpO2 99 % 11/24/21 1212  Vitals shown include unvalidated device data.  Last Pain:  Vitals:   11/24/21 1000  TempSrc: Oral         Complications: No notable events documented.

## 2021-11-24 NOTE — Telephone Encounter (Signed)
Letter has been sent to patient informing them that their home sleep study has expired. Patient will need to call and schedule an office visit to re-evaluate the need for a sleep study.   

## 2021-11-25 ENCOUNTER — Telehealth (HOSPITAL_COMMUNITY): Payer: Self-pay | Admitting: *Deleted

## 2021-11-25 LAB — SURGICAL PATHOLOGY

## 2021-11-25 NOTE — Progress Notes (Signed)
Cardiac Individual Treatment Plan  Patient Details  Name: Lopez Dentinger MRN: [id number] Date of Birth: 02/28/1940 Referring Provider:   Flowsheet Row INTENSIVE CARDIAC REHAB ORIENT from 10/23/2021 in [place]  Referring Provider Dr. Lenna Sciara, MD       Initial Encounter Date:  [place] from 10/23/2021 in [place]  Date 10/23/21       Visit Diagnosis: 09/30/21 S/P TAVR   Patient's Home Medications on Admission:  Current Outpatient Medications:    amiodarone (PACERONE) 200 MG tablet, TAKE ONE-HALF TABLET BY MOUTH  DAILY, Disp: 45 tablet, Rfl: 3   amLODipine (NORVASC) 5 MG tablet, Take 1 tablet (5 mg total) by mouth daily., Disp: 90 tablet, Rfl: 3   aspirin EC 81 MG tablet, Take 1 tablet (81 mg total) by mouth daily. Swallow whole., Disp: 90 tablet, Rfl: 3   atorvastatin (LIPITOR) 80 MG tablet, TAKE 1 TABLET BY MOUTH ONCE  DAILY, Disp: 90 tablet, Rfl: 3   carvedilol (COREG) 6.25 MG tablet, TAKE 1 TABLET BY MOUTH  TWICE DAILY, Disp: 120 tablet, Rfl: 5   doxycycline (VIBRA-TABS) 100 MG tablet, Take 100 mg by mouth 2 (two) times daily., Disp: , Rfl:    ezetimibe (ZETIA) 10 MG tablet, TAKE 1 TABLET BY MOUTH DAILY, Disp: 90 tablet, Rfl: 3   furosemide (LASIX) 20 MG tablet, Take 1 tablet (20 mg total) by mouth daily. (Patient taking differently: Take 20 mg by mouth as needed for fluid or edema.), Disp: , Rfl:    levothyroxine (SYNTHROID) 50 MCG tablet, TAKE 1 TABLET BY MOUTH DAILY  BEFORE BREAKFAST, Disp: 90 tablet, Rfl: 3   Multiple Vitamins-Minerals (PRESERVISION AREDS 2) CAPS, Take 1 capsule by mouth 2 (two) times daily., Disp: , Rfl:    sacubitril-valsartan (ENTRESTO) 24-26 MG, Take 1 tablet by mouth 2 (two) times daily., Disp: 60 tablet, Rfl: 11   tamsulosin (FLOMAX) 0.4 MG CAPS capsule, Take 0.4 mg by mouth daily., Disp: , Rfl:    traMADol (ULTRAM) 50 MG tablet, Take 1 tablet (50 mg  total) by mouth every 6 (six) hours as needed., Disp: 8 tablet, Rfl: 0  Past Medical History: Past Medical History:  Diagnosis Date   Aortic stenosis 2008   Arthritis    Blood transfusion    Blood transfusion without reported diagnosis    CAD (coronary artery disease) 2008   Cataract    Complication of anesthesia    hallucinated once   H/O hiatal hernia    Heart murmur    AFTER REPLACED VALVE WENT AWAY   Hx of colonic polyps    Hyperlipidemia    Hypertension    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 TAli Loweand Dr. BCyndia Bent  Thyroid disease     Tobacco Use: Social History   Tobacco Use  Smoking Status Former   Types: Cigarettes   Quit date: 05/25/1973   Years since quitting: 48.5  Smokeless Tobacco Never    Labs: Review Flowsheet  More data exists      Latest Ref Rng & Units 12/13/2018 12/18/2019 09/24/2021 09/29/2021 09/30/2021  Labs for ITP Cardiac and Pulmonary Rehab  Cholestrol 100 - 199 mg/dL 144  150  - - -  LDL (calc) 0 - 99 mg/dL 73  78  - - -  HDL-C >39 mg/dL 58  54  - - -  Trlycerides 0 - 149 mg/dL  64  96  - - -  Hemoglobin A1c 4.8 - 5.6 % - - - 5.4  -  PH, Arterial 7.35 - 7.45 - - 7.428  7.5  -  PCO2 arterial 32 - 48 mmHg - - 36.3  41  -  Bicarbonate 20.0 - 28.0 mmol/L - - 25.0  26.0  24.0  32.0  -  TCO2 22 - 32 mmol/L - - '26  27  25  '$ - 26  22   O2 Saturation % - - 68  70  92  98.3  -    Capillary Blood Glucose: No results found for: "GLUCAP"   Exercise Target Goals: Exercise Program Goal: Individual exercise prescription set using results from initial 6 min walk test and THRR while considering  patient's activity barriers and safety.   Exercise Prescription Goal: Initial exercise prescription builds to 30-45 minutes a day of aerobic activity, 2-3 days per week.  Home exercise guidelines will be given to patient during program as part of exercise prescription that the participant will  acknowledge.  Activity Barriers & Risk Stratification:  Activity Barriers & Cardiac Risk Stratification - 10/23/21 1111       Activity Barriers & Cardiac Risk Stratification   Activity Barriers Left Knee Replacement;Right Knee Replacement;Left Hip Replacement;Deconditioning;Joint Problems;Balance Concerns;Decreased Ventricular Function    Cardiac Risk Stratification High             6 Minute Walk:  6 Minute Walk     Row Name 10/23/21 1106         6 Minute Walk   Phase Initial     Distance 1098 feet     Walk Time 6 minutes     # of Rest Breaks 0     MPH 2.08     METS 1.73     RPE 10     Perceived Dyspnea  0     VO2 Peak 6.06     Symptoms No     Resting HR 61 bpm     Resting BP 118/50     Resting Oxygen Saturation  98 %     Exercise Oxygen Saturation  during 6 min walk 97 %     Max Ex. HR 80 bpm     Max Ex. BP 128/52     2 Minute Post BP 148/62              Oxygen Initial Assessment:   Oxygen Re-Evaluation:   Oxygen Discharge (Final Oxygen Re-Evaluation):   Initial Exercise Prescription:  Initial Exercise Prescription - 10/23/21 1100       Date of Initial Exercise RX and Referring Provider   Date 10/23/21    Referring Provider Dr. Lenna Sciara, MD    Expected Discharge Date 12/19/21      NuStep   Level 1    SPM 75    Minutes 25    METs 1.5      Prescription Details   Frequency (times per week) 3    Duration Progress to 30 minutes of continuous aerobic without signs/symptoms of physical distress      Intensity   THRR 40-80% of Max Heartrate 56-134    Ratings of Perceived Exertion 11-13    Perceived Dyspnea 0-4      Progression   Progression Continue progressive overload as per policy without signs/symptoms or physical distress.      Resistance Training   Training Prescription Yes    Weight 3    Reps 10-15  Perform Capillary Blood Glucose checks as needed.  Exercise Prescription Changes:   Exercise Prescription  Changes     Row Name 10/27/21 1100 11/03/21 1024 11/14/21 1011         Response to Exercise   Blood Pressure (Admit) 128/50 98/72 100/58     Blood Pressure (Exercise) 124/60 118/60 112/52     Blood Pressure (Exit) 126/70 101/74 108/56     Heart Rate (Admit) 65 bpm 67 bpm 65 bpm     Heart Rate (Exercise) 82 bpm 87 bpm 93 bpm     Heart Rate (Exit) 65 bpm 70 bpm 62 bpm     Rating of Perceived Exertion (Exercise) '11 12 11     '$ Symptoms None None None     Comments Pt's first day in the CRP2 program -- --     Duration Progress to 30 minutes of  aerobic without signs/symptoms of physical distress Progress to 30 minutes of  aerobic without signs/symptoms of physical distress Progress to 30 minutes of  aerobic without signs/symptoms of physical distress     Intensity THRR unchanged THRR unchanged THRR unchanged       Progression   Progression Continue to progress workloads to maintain intensity without signs/symptoms of physical distress. Continue to progress workloads to maintain intensity without signs/symptoms of physical distress. Continue to progress workloads to maintain intensity without signs/symptoms of physical distress.     Average METs 1.8 2 2.6       Resistance Training   Training Prescription Yes Yes Yes     Weight '3 3 4 '$ lbs     Reps 10-15 10-15 10-15     Time 10 Minutes 10 Minutes 10 Minutes       Interval Training   Interval Training No No No       NuStep   Level '1 1 2     '$ SPM 69 80 85     Minutes '25 25 25     '$ METs 1.8 2 2.6       Home Exercise Plan   Plans to continue exercise at -- -- [facility] (comment)  [facility]     Frequency -- -- Add 2 additional days to program exercise sessions.     Initial Home Exercises Provided -- -- 11/07/21              Exercise Comments:   Exercise Comments     Row Name 10/27/21 1154 11/03/21 1057 11/07/21 1111       Exercise Comments Pt's first day in the CRP2 program. No complaints with  todays session. Reviewed METs with patient. Reviewed home exercise guidelines and goals with patient.              Exercise Goals and Review:   Exercise Goals     Row Name 10/23/21 1119             Exercise Goals   Increase Physical Activity Yes       Intervention Provide advice, education, support and counseling about physical activity/exercise needs.;Develop an individualized exercise prescription for aerobic and resistive training based on initial evaluation findings, risk stratification, comorbidities and participant's personal goals.       Expected Outcomes Short Term: Attend rehab on a regular basis to increase amount of physical activity.;Long Term: Add in home exercise to make exercise part of routine and to increase amount of physical activity.;Long Term: Exercising regularly at least 3-5 days a week.  Increase Strength and Stamina Yes       Intervention Provide advice, education, support and counseling about physical activity/exercise needs.;Develop an individualized exercise prescription for aerobic and resistive training based on initial evaluation findings, risk stratification, comorbidities and participant's personal goals.       Expected Outcomes Short Term: Increase workloads from initial exercise prescription for resistance, speed, and METs.;Short Term: Perform resistance training exercises routinely during rehab and add in resistance training at home;Long Term: Improve cardiorespiratory fitness, muscular endurance and strength as measured by increased METs and functional capacity (6MWT)       Able to understand and use rate of perceived exertion (RPE) scale Yes       Intervention Provide education and explanation on how to use RPE scale       Expected Outcomes Short Term: Able to use RPE daily in rehab to express subjective intensity level;Long Term:  Able to use RPE to guide intensity level when exercising independently       Knowledge and understanding of Target  Heart Rate Range (THRR) Yes       Intervention Provide education and explanation of THRR including how the numbers were predicted and where they are located for reference       Expected Outcomes Short Term: Able to state/look up THRR;Long Term: Able to use THRR to govern intensity when exercising independently;Short Term: Able to use daily as guideline for intensity in rehab       Understanding of Exercise Prescription Yes       Intervention Provide education, explanation, and written materials on patient's individual exercise prescription       Expected Outcomes Short Term: Able to explain program exercise prescription;Long Term: Able to explain home exercise prescription to exercise independently                Exercise Goals Re-Evaluation :  Exercise Goals Re-Evaluation     [place] Name 10/27/21 1153 11/07/21 1102           Exercise Goal Re-Evaluation   Exercise Goals Review Increase Physical Activity;Increase Strength and Stamina;Able to understand and use rate of perceived exertion (RPE) scale;Knowledge and understanding of Target Heart Rate Range (THRR);Understanding of Exercise Prescription Increase Physical Activity;Increase Strength and Stamina;Able to understand and use rate of perceived exertion (RPE) scale;Knowledge and understanding of Target Heart Rate Range (THRR);Understanding of Exercise Prescription;Able to check pulse independently      Comments Pt's first day in the CRP2 program. Pt understands the exercise Rx, THRR and RPE scale. [facility] and using the equipment as his mode of home exercise. Patient currently averages 3000 steps/day. Patient is able to manually check his pulse.      Expected Outcomes Will continue to montior patient and progress exercise workloasa as tolerated. [facility] at least 2 days/week in addition to exercise at cardiac rehab to achieve 150 minutes of aerobic  exercise/week.               Discharge Exercise Prescription (Final Exercise Prescription Changes):  Exercise Prescription Changes - 11/14/21 1011       Response to Exercise   Blood Pressure (Admit) 100/58    Blood Pressure (Exercise) 112/52    Blood Pressure (Exit) 108/56    Heart Rate (Admit) 65 bpm    Heart Rate (Exercise) 93 bpm    Heart Rate (Exit) 62 bpm    Rating of Perceived Exertion (Exercise)  11    Symptoms None    Duration Progress to 30 minutes of  aerobic without signs/symptoms of physical distress    Intensity THRR unchanged      Progression   Progression Continue to progress workloads to maintain intensity without signs/symptoms of physical distress.    Average METs 2.6      Resistance Training   Training Prescription Yes    Weight 4 lbs    Reps 10-15    Time 10 Minutes      Interval Training   Interval Training No      NuStep   Level 2    SPM 85    Minutes 25    METs 2.6      Home Exercise Plan   Plans to continue exercise at [facility] (comment)   [facility]   Frequency Add 2 additional days to program exercise sessions.    Initial Home Exercises Provided 11/07/21             Nutrition:  Target Goals: Understanding of nutrition guidelines, daily intake of sodium '1500mg'$ , cholesterol '200mg'$ , calories 30% from fat and 7% or less from saturated fats, daily to have 5 or more servings of fruits and vegetables.  Biometrics:  Pre Biometrics - 10/23/21 1120       Pre Biometrics   Waist Circumference 39.5 inches    Hip Circumference 40 inches    Waist to Hip Ratio 0.99 %    Triceps Skinfold 16 mm    % Body Fat 28.4 %    Grip Strength 32 kg    Flexibility --   pt unable to reach   Single Leg Stand 4.2 seconds              Nutrition Therapy Plan and Nutrition Goals:  Nutrition Therapy & Goals - 11/21/21 1120       Nutrition Therapy   Diet Heart Healthy Diet    Drug/Food Interactions  Statins/Certain Fruits      Personal Nutrition Goals   Nutrition Goal Patient to understand strategies for reducing cardiovascular risk by attending the [facility] education and nutrition classes    Personal Goal #2 Patient to build a healthy plate daily with variety of fruits, vegetables, whole grains, lean protein/plant protein and nonfat/lowfat dairy.    Personal Goal #3 Patient to limit to '1500mg'$  of sodium daily.    Comments Goals in progress. Vern continues to attend the [employer] and nutrition series. He continues to focus on weight loss with goal of ~185#. He has eliminated/reduced many carbohydrates as a strategy for weight loss. He does have room for improvement with increasing fruit, vegetable, and whole grain intake to aid with reducing cardiovascular risk. Encouraged using the plate method as a guide for meal planning to make 1/2 the plate vegetables at meals or 5 vegetables servings daily. He continues to read food labels for sugar and sodium.      Intervention Plan   Intervention Prescribe, educate and counsel regarding individualized specific dietary modifications aiming towards targeted core components such as weight, hypertension, lipid management, diabetes, heart failure and other comorbidities.;Nutrition handout(s) given to patient.    Expected Outcomes Short Term Goal: Understand basic principles of dietary content, such as calories, fat, sodium, cholesterol and nutrients.;Long Term Goal: Adherence to prescribed nutrition plan.             Nutrition Assessments:  Nutrition Assessments - 10/27/21 1419  Rate Your Plate Scores   Pre Score 64            MEDIFICTS Score Key: ?70 Need to make dietary changes  40-70 Heart Healthy Diet ? 40 Therapeutic Level Cholesterol Diet   Flowsheet Row INTENSIVE CARDIAC REHAB from 10/27/2021 in [place]  Picture Your Plate Total Score on Admission 64      Picture Your Plate  Scores: <83 Unhealthy dietary pattern with much room for improvement. 41-50 Dietary pattern unlikely to meet recommendations for good health and room for improvement. 51-60 More healthful dietary pattern, with some room for improvement.  >60 Healthy dietary pattern, although there may be some specific behaviors that could be improved.    Nutrition Goals Re-Evaluation:  Nutrition Goals Re-Evaluation     [place] Name 10/27/21 1149 11/21/21 1120           Goals   Current Weight 197 lb 12 oz (89.7 kg) 200 lb 6.4 oz (90.9 kg)      Comment GFR 56, Cr 1.30, RBC 3.26, hemoglobin 10.2, HCT 32.8, A1c WNL, Lipoprotein A 229, lipid panel WNL. no new labs, most recent labs show GFR 56, Cr 1.30, RBC 3.26, hemoglobin 10.2, HCT 32.8, A1c WNL, Lipoprotein A 229, lipid panel WNL.      Expected Outcome Goals in progress. Patient reports making many dietary changes including reduced sodium intake and increased high fiber foods. Patient is down ~24# since prior to his surgery (09/24/2021); his goal weight is 185#. He has good support from his wife and adult children. Goals in progress. [place]on continues to attend the [employer] and nutrition series. He continues to focus on weight loss with goal of ~185# though his weight is up ~4# since starting with our program. He has eliminated/reduced many carbohydrates as a strategy for weight loss. He does have room for improvement with increasing fruit, vegetable, and whole grain intake to aid with reducing cardiovascular risk and creating consistent calorie deficit. Encouraged using the plate method as a guide for meal planning to make 1/2 the plate vegetables at meals or 5 vegetables servings daily. He continues to read food labels for sugar and sodium.               Nutrition Goals Re-Evaluation:  Nutrition Goals Re-Evaluation     [place] Name 10/27/21 1149 11/21/21 1120           Goals   Current Weight 197 lb 12 oz (89.7 kg) 200 lb 6.4 oz (90.9 kg)      Comment  GFR 56, Cr 1.30, RBC 3.26, hemoglobin 10.2, HCT 32.8, A1c WNL, Lipoprotein A 229, lipid panel WNL. no new labs, most recent labs show GFR 56, Cr 1.30, RBC 3.26, hemoglobin 10.2, HCT 32.8, A1c WNL, Lipoprotein A 229, lipid panel WNL.      Expected Outcome Goals in progress. Patient reports making many dietary changes including reduced sodium intake and increased high fiber foods. Patient is down ~24# since prior to his surgery (09/24/2021); his goal weight is 185#. He has good support from his wife and adult children. Goals in progress. Grason continues to attend the [employer] and nutrition series. He continues to focus on weight loss with goal of ~185# though his weight is up ~4# since starting with our program. He has eliminated/reduced many carbohydrates as a strategy for weight loss. He does have room for improvement with increasing fruit, vegetable, and whole grain intake to aid with reducing cardiovascular risk and  creating consistent calorie deficit. Encouraged using the plate method as a guide for meal planning to make 1/2 the plate vegetables at meals or 5 vegetables servings daily. He continues to read food labels for sugar and sodium.               Nutrition Goals Discharge (Final Nutrition Goals Re-Evaluation):  Nutrition Goals Re-Evaluation - 11/21/21 1120       Goals   Current Weight 200 lb 6.4 oz (90.9 kg)    Comment no new labs, most recent labs show GFR 56, Cr 1.30, RBC 3.26, hemoglobin 10.2, HCT 32.8, A1c WNL, Lipoprotein A 229, lipid panel WNL.    Expected Outcome Goals in progress. [place] continues to attend the [employer] and nutrition series. He continues to focus on weight loss with goal of ~185# though his weight is up ~4# since starting with our program. He has eliminated/reduced many carbohydrates as a strategy for weight loss. He does have room for improvement with increasing fruit, vegetable, and whole grain intake to aid with reducing cardiovascular risk and  creating consistent calorie deficit. Encouraged using the plate method as a guide for meal planning to make 1/2 the plate vegetables at meals or 5 vegetables servings daily. He continues to read food labels for sugar and sodium.             Psychosocial: Target Goals: Acknowledge presence or absence of significant depression and/or stress, maximize coping skills, provide positive support system. Participant is able to verbalize types and ability to use techniques and skills needed for reducing stress and depression.  Initial Review & Psychosocial Screening:  Initial Psych Review & Screening - 10/23/21 1029       Initial Review   Current issues with None Identified      Family Dynamics   Good Support System? Yes   Paola has his wife, two children and grandchildren who live in the area for support     Barriers   Psychosocial barriers to participate in program There are no identifiable barriers or psychosocial needs.      Screening Interventions   Interventions Encouraged to exercise             Quality of Life Scores:  Quality of Life - 10/23/21 1125       Quality of Life   Select Quality of Life      Quality of Life Scores   Health/Function Pre 28 %    Socioeconomic Pre 27.6 %    Psych/Spiritual Pre 30 %    Family Pre 30 %    GLOBAL Pre 28.76 %            Scores of 19 and below usually indicate a poorer quality of life in these areas.  A difference of  2-3 points is a clinically meaningful difference.  A difference of 2-3 points in the total score of the Quality of Life Index has been associated with significant improvement in overall quality of life, self-image, physical symptoms, and general health in studies assessing change in quality of life.  PHQ-9: Review Flowsheet  More data exists      11/04/2021 10/23/2021 05/19/2021 05/27/2020 10/17/2019  Depression screen PHQ 2/9  Decreased Interest 0 0 0 0 0  Down, Depressed, Hopeless 0 0 0 0 0  PHQ - 2 Score 0 0 0 0  0   Interpretation of Total Score  Total Score Depression Severity:  1-4 = Minimal depression, 5-9 = Mild depression, 10-14 =  Moderate depression, 15-19 = Moderately severe depression, 20-27 = Severe depression   Psychosocial Evaluation and Intervention:   Psychosocial Re-Evaluation:  Psychosocial Re-Evaluation     [place] Name 10/28/21 6073 11/04/21 1102 11/25/21 1508         Psychosocial Re-Evaluation   Current issues with None Identified None Identified Current Stress Concerns     Comments -- -- Yaiden says he concerned that he may have bladder cancer and he had a TURP yesterday     Interventions Encouraged to attend Cardiac Rehabilitation for the exercise Encouraged to attend Cardiac Rehabilitation for the exercise Stress management education;Encouraged to attend Cardiac Rehabilitation for the exercise;Relaxation education     Continue Psychosocial Services  No Follow up required No Follow up required Follow up required by staff       Initial Review   Source of Stress Concerns -- -- Chronic Illness     Comments -- -- Will continue to monitor and offer support as needed.              Psychosocial Discharge (Final Psychosocial Re-Evaluation):  Psychosocial Re-Evaluation - 11/25/21 1508       Psychosocial Re-Evaluation   Current issues with Current Stress Concerns    Comments Ariez says he concerned that he may have bladder cancer and he had a TURP yesterday    Interventions Stress management education;Encouraged to attend Cardiac Rehabilitation for the exercise;Relaxation education    Continue Psychosocial Services  Follow up required by staff      Initial Review   Source of Stress Concerns Chronic Illness    Comments Will continue to monitor and offer support as needed.             Vocational Rehabilitation: Provide vocational rehab assistance to qualifying candidates.   Vocational Rehab Evaluation & Intervention:  Vocational Rehab - 10/23/21 1033        Initial Vocational Rehab Evaluation & Intervention   Assessment shows need for Vocational Rehabilitation No   Avari ir retired and does not need vocational rehab at this time            Education: Education Goals: Education classes will be provided on a weekly basis, covering required topics. Participant will state understanding/return demonstration of topics presented.    Education     Row Name 10/27/21 1400     Education   Cardiac Education Topics [facility]   Lexicographer Nutrition   Nutrition Calorie Density   Instruction Review Code 1- Verbalizes Understanding   Class Start Time 1140   Class Stop Time 1230   Class Time Calculation (min) 50 min    [place] Name 10/29/21 1200     Education   Cardiac Education Topics [place]   Educator Dietitian   Weekly Topic [employer] Desserts   Instruction Review Code 1- Verbalizes Understanding   Class Start Time 1130   Class Stop Time 1205   Class Time Calculation (min) 35 min    [place] Name 11/03/21 1300     Education   Cardiac Education Topics [place]   [employer]     Workshops   Educator Exercise Physiologist   Select Exercise   Exercise Workshop Exercise Basics: Building Your Action Plan   Instruction Review Code 1- Verbalizes Understanding   Class Start Time 1140   Class Stop Time 1224   Class Time Calculation (  min) 44 min    [place] Name 11/05/21 1400     Education   Cardiac Education Topics Pritikin   Barista - Meals in a Snap   Instruction Review Code 1- Verbalizes Understanding   Class Start Time 1135   Class Stop Time 1210   Class Time Calculation (min) 35 min    [place] Name 11/07/21 1200     Education   Cardiac Education Topics Pritikin   Academic librarian  Exercise Education   Exercise Education Move It!   Instruction Review Code 1- Verbalizes Understanding   Class Start Time 1138   Class Stop Time 1214   Class Time Calculation (min) 36 min    Row Name 11/12/21 1300     Education   Cardiac Education Topics Pritikin   Financial trader   Weekly Topic Simple Sides and Sauces   Instruction Review Code 1- Verbalizes Understanding   Class Start Time 1138   Class Stop Time 1210   Class Time Calculation (min) 32 min    [place] Name 11/14/21 1300     Education   Cardiac Education Topics Pritikin   Tax inspector General Education   General Education Hypertension and Heart Disease   Instruction Review Code 1- Verbalizes Understanding   Class Start Time 1145   Class Stop Time 1227   Class Time Calculation (min) 42 min    [place] Name 11/19/21 1300     Education   Cardiac Education Topics Pritikin   Financial trader   Weekly Topic One-Pot Wonders   Instruction Review Code 1- Verbalizes Understanding   Class Start Time 1142   Class Stop Time 1213   Class Time Calculation (min) 31 min    [place] Name 11/21/21 1200     Education   Cardiac Education Topics Pritikin   Environmental consultant Psychosocial   Psychosocial Workshop New Thoughts, New Behaviors   Instruction Review Code 1- Verbalizes Understanding   Class Start Time 1143   Class Stop Time 1225   Class Time Calculation (min) 42 min            Core Videos: Exercise    Move It!  Clinical staff conducted group or individual video education with verbal and written material and guidebook.  Patient learns the recommended Pritikin exercise program. Exercise with the goal of living a long, healthy life. Some of the health benefits of exercise include controlled diabetes, healthier blood  pressure levels, improved cholesterol levels, improved heart and lung capacity, improved sleep, and better body composition. Everyone should speak with their doctor before starting or changing an exercise routine.  Biomechanical Limitations Clinical staff conducted group or individual video education with verbal and written material and guidebook.  Patient learns how biomechanical limitations can impact exercise and how we can mitigate and possibly overcome limitations to have an impactful and balanced exercise routine.  Body Composition Clinical staff conducted group or individual video education with verbal and written material and guidebook.  Patient learns that body composition (ratio of muscle mass to fat  mass) is a key component to assessing overall fitness, rather than body weight alone. Increased fat mass, especially visceral belly fat, can put [place] at increased risk for metabolic syndrome, type 2 diabetes, heart disease, and even death. It is recommended to combine diet and exercise (cardiovascular and resistance training) to improve your body composition. Seek guidance from your physician and exercise physiologist before implementing an exercise routine.  Exercise Action Plan Clinical staff conducted group or individual video education with verbal and written material and guidebook.  Patient learns the recommended strategies to achieve and enjoy long-term exercise adherence, including variety, self-motivation, self-efficacy, and positive decision making. Benefits of exercise include fitness, good health, weight management, more energy, better sleep, less stress, and overall well-being.  Medical   Heart Disease Risk Reduction Clinical staff conducted group or individual video education with verbal and written material and guidebook.  Patient learns our heart is our most vital organ as it circulates oxygen, nutrients, white blood cells, and hormones throughout the entire body, and carries  waste away. Data supports a plant-based eating plan like the Pritikin Program for its effectiveness in slowing progression of and reversing heart disease. The video provides a number of recommendations to address heart disease.   Metabolic Syndrome and Belly Fat  Clinical staff conducted group or individual video education with verbal and written material and guidebook.  Patient learns what metabolic syndrome is, how it leads to heart disease, and how one can reverse it and keep it from coming back. You have metabolic syndrome if you have 3 of the following 5 criteria: abdominal obesity, high blood pressure, high triglycerides, low HDL cholesterol, and high blood sugar.  Hypertension and Heart Disease Clinical staff conducted group or individual video education with verbal and written material and guidebook.  Patient learns that high blood pressure, or hypertension, is very common in the [place]. Hypertension is largely due to excessive salt intake, but other important risk factors include being overweight, physical inactivity, drinking too much alcohol, smoking, and not eating enough potassium from fruits and vegetables. High blood pressure is a leading risk factor for heart attack, stroke, congestive heart failure, dementia, kidney failure, and premature death. Long-term effects of excessive salt intake include stiffening of the arteries and thickening of heart muscle and organ damage. Recommendations include ways to reduce hypertension and the risk of heart disease.  Diseases of Our Time - Focusing on Diabetes Clinical staff conducted group or individual video education with verbal and written material and guidebook.  Patient learns why the best way to stop diseases of our time is prevention, through food and other lifestyle changes. Medicine (such as prescription pills and surgeries) is often only a Band-Aid on the problem, not a long-term solution. Most common diseases of our time include  obesity, type 2 diabetes, hypertension, heart disease, and cancer. The Pritikin Program is recommended and has been proven to help reduce, reverse, and/or prevent the damaging effects of metabolic syndrome.  Nutrition   Overview of the Pritikin Eating Plan  Clinical staff conducted group or individual video education with verbal and written material and guidebook.  Patient learns about the [place] for disease risk reduction. The [place] emphasizes a wide variety of unrefined, minimally-processed carbohydrates, like fruits, vegetables, whole grains, and legumes. Go, Caution, and Stop food choices are explained. Plant-based and lean animal proteins are emphasized. Rationale provided for low sodium intake for blood pressure control, low added sugars for blood sugar stabilization, and low added fats  and oils for coronary artery disease risk reduction and weight management.  Calorie Density  Clinical staff conducted group or individual video education with verbal and written material and guidebook.  Patient learns about calorie density and how it impacts the Pritikin Eating Plan. Knowing the characteristics of the food you choose will help you decide whether those foods will lead to weight gain or weight loss, and whether you want to consume more or less of them. Weight loss is usually a side effect of the Pritikin Eating Plan because of its focus on low calorie-dense foods.  Label Reading  Clinical staff conducted group or individual video education with verbal and written material and guidebook.  Patient learns about the Pritikin recommended label reading guidelines and corresponding recommendations regarding calorie density, added sugars, sodium content, and whole grains.  Dining Out - Part 1  Clinical staff conducted group or individual video education with verbal and written material and guidebook.  Patient learns that restaurant meals can be sabotaging because they can be  so high in calories, fat, sodium, and/or sugar. Patient learns recommended strategies on how to positively address this and avoid unhealthy pitfalls.  Facts on Fats  Clinical staff conducted group or individual video education with verbal and written material and guidebook.  Patient learns that lifestyle modifications can be just as effective, if not more so, as many medications for lowering your risk of heart disease. A Pritikin lifestyle can help to reduce your risk of inflammation and atherosclerosis (cholesterol build-up, or plaque, in the artery walls). Lifestyle interventions such as dietary choices and physical activity address the cause of atherosclerosis. A review of the types of fats and their impact on blood cholesterol levels, along with dietary recommendations to reduce fat intake is also included.  Nutrition Action Plan  Clinical staff conducted group or individual video education with verbal and written material and guidebook.  Patient learns how to incorporate Pritikin recommendations into their lifestyle. Recommendations include planning and keeping personal health goals in mind as an important part of their success.  Healthy Mind-Set    Healthy Minds, Bodies, Hearts  Clinical staff conducted group or individual video education with verbal and written material and guidebook.  Patient learns how to identify when they are stressed. Video will discuss the impact of that stress, as well as the many benefits of stress management. Patient will also be introduced to stress management techniques. The way we think, act, and feel has an impact on our hearts.  How Our Thoughts Can Heal Our Hearts  Clinical staff conducted group or individual video education with verbal and written material and guidebook.  Patient learns that negative thoughts can cause depression and anxiety. This can result in negative lifestyle behavior and serious health problems. Cognitive behavioral therapy is an  effective method to help control our thoughts in order to change and improve our emotional outlook.  Additional Videos:  Exercise    Improving Performance  Clinical staff conducted group or individual video education with verbal and written material and guidebook.  Patient learns to use a non-linear approach by alternating intensity levels and lengths of time spent exercising to help burn more calories and lose more body fat. Cardiovascular exercise helps improve heart health, metabolism, hormonal balance, blood sugar control, and recovery from fatigue. Resistance training improves strength, endurance, balance, coordination, reaction time, metabolism, and muscle mass. Flexibility exercise improves circulation, posture, and balance. Seek guidance from your physician and exercise physiologist before implementing an exercise routine and learn your  capabilities and proper form for all exercise.  Introduction to Yoga  Clinical staff conducted group or individual video education with verbal and written material and guidebook.  Patient learns about yoga, a discipline of the coming together of mind, breath, and body. The benefits of yoga include improved flexibility, improved range of motion, better posture and core strength, increased lung function, weight loss, and positive self-image. Yoga's heart health benefits include lowered blood pressure, healthier heart rate, decreased cholesterol and triglyceride levels, improved immune function, and reduced stress. Seek guidance from your physician and exercise physiologist before implementing an exercise routine and learn your capabilities and proper form for all exercise.  Medical   Aging: Enhancing Your Quality of Life  Clinical staff conducted group or individual video education with verbal and written material and guidebook.  Patient learns key strategies and recommendations to stay in good physical health and enhance quality of life, such as prevention  strategies, having an advocate, securing a [place], and keeping a list of medications and system for tracking them. It also discusses how to avoid risk for bone loss.  Biology of Weight Control  Clinical staff conducted group or individual video education with verbal and written material and guidebook.  Patient learns that weight gain occurs because we consume more calories than we burn (eating more, moving less). Even if your body weight is normal, you may have higher ratios of fat compared to muscle mass. Too much body fat puts you at increased risk for cardiovascular disease, heart attack, stroke, type 2 diabetes, and obesity-related cancers. In addition to exercise, following the [place] can help reduce your risk.  Decoding Lab Results  Clinical staff conducted group or individual video education with verbal and written material and guidebook.  Patient learns that lab test reflects one measurement whose values change over time and are influenced by many factors, including medication, stress, sleep, exercise, food, hydration, pre-existing medical conditions, and more. It is recommended to use the knowledge from this video to become more involved with your lab results and evaluate your numbers to speak with your doctor.   Diseases of Our Time - Overview  Clinical staff conducted group or individual video education with verbal and written material and guidebook.  Patient learns that according to the CDC, 50% to 70% of chronic diseases (such as obesity, type 2 diabetes, elevated lipids, hypertension, and heart disease) are avoidable through lifestyle improvements including healthier food choices, listening to satiety cues, and increased physical activity.  Sleep Disorders Clinical staff conducted group or individual video education with verbal and written material and guidebook.  Patient learns how good quality and duration of sleep are important to  overall health and well-being. Patient also learns about sleep disorders and how they impact health along with recommendations to address them, including discussing with a physician.  Nutrition  Dining Out - Part 2 Clinical staff conducted group or individual video education with verbal and written material and guidebook.  Patient learns how to plan ahead and communicate in order to maximize their dining experience in a healthy and nutritious manner. Included are recommended food choices based on the type of restaurant the patient is visiting.   Fueling a Best boy conducted group or individual video education with verbal and written material and guidebook.  There is a strong connection between our food choices and our health. Diseases like obesity and type 2 diabetes are very prevalent and are in large-part  due to lifestyle choices. The Pritikin Eating Plan provides plenty of food and hunger-curbing satisfaction. It is easy to follow, affordable, and helps reduce health risks.  Menu Workshop  Clinical staff conducted group or individual video education with verbal and written material and guidebook.  Patient learns that restaurant meals can sabotage health goals because they are often packed with calories, fat, sodium, and sugar. Recommendations include strategies to plan ahead and to communicate with the manager, chef, or server to help order a healthier meal.  Planning Your Eating Strategy  Clinical staff conducted group or individual video education with verbal and written material and guidebook.  Patient learns about the [place] and its benefit of reducing the risk of disease. The [place] does not focus on calories. Instead, it emphasizes high-quality, nutrient-rich foods. By knowing the characteristics of the foods, we choose, we can determine their calorie density and make informed decisions.  Targeting Your Nutrition Priorities  Clinical staff  conducted group or individual video education with verbal and written material and guidebook.  Patient learns that lifestyle habits have a tremendous impact on disease risk and progression. This video provides eating and physical activity recommendations based on your personal health goals, such as reducing LDL cholesterol, losing weight, preventing or controlling type 2 diabetes, and reducing high blood pressure.  Vitamins and Minerals  Clinical staff conducted group or individual video education with verbal and written material and guidebook.  Patient learns different ways to obtain key vitamins and minerals, including through a recommended healthy diet. It is important to discuss all supplements you take with your doctor.   Healthy Mind-Set    Smoking Cessation  Clinical staff conducted group or individual video education with verbal and written material and guidebook.  Patient learns that cigarette smoking and tobacco addiction pose a serious health risk which affects millions of people. Stopping smoking will significantly reduce the risk of heart disease, lung disease, and many forms of cancer. Recommended strategies for quitting are covered, including working with your doctor to develop a successful plan.  Culinary   Becoming a Financial trader conducted group or individual video education with verbal and written material and guidebook.  Patient learns that cooking at home can be healthy, cost-effective, quick, and puts them in control. Keys to cooking healthy recipes will include looking at your recipe, assessing your equipment needs, planning ahead, making it simple, choosing cost-effective seasonal ingredients, and limiting the use of added fats, salts, and sugars.  Cooking - Breakfast and Snacks  Clinical staff conducted group or individual video education with verbal and written material and guidebook.  Patient learns how important breakfast is to satiety and nutrition  through the entire day. Recommendations include key foods to eat during breakfast to help stabilize blood sugar levels and to prevent overeating at meals later in the day. Planning ahead is also a key component.  Cooking - Human resources officer conducted group or individual video education with verbal and written material and guidebook.  Patient learns eating strategies to improve overall health, including an approach to cook more at home. Recommendations include thinking of animal protein as a side on your plate rather than center stage and focusing instead on lower calorie dense options like vegetables, fruits, whole grains, and plant-based proteins, such as beans. Making sauces in large quantities to freeze for later and leaving the skin on your vegetables are also recommended to maximize your experience.  Cooking - Healthy Salads  and Dressing Clinical staff conducted group or individual video education with verbal and written material and guidebook.  Patient learns that vegetables, fruits, whole grains, and legumes are the foundations of the [place]. Recommendations include how to incorporate each of these in flavorful and healthy salads, and how to create homemade salad dressings. Proper handling of ingredients is also covered. Cooking - Soups and [employer] - Soups and Desserts Clinical staff conducted group or individual video education with verbal and written material and guidebook.  Patient learns that Pritikin soups and desserts make for easy, nutritious, and delicious snacks and meal components that are low in sodium, fat, sugar, and calorie density, while high in vitamins, minerals, and filling fiber. Recommendations include simple and healthy ideas for soups and desserts.   Overview     The Pritikin Solution Program Overview Clinical staff conducted group or individual video education with verbal and written material and guidebook.  Patient learns that the  results of the [place] Program have been documented in more than 100 articles published in peer-reviewed journals, and the benefits include reducing risk factors for (and, in some cases, even reversing) high cholesterol, high blood pressure, type 2 diabetes, obesity, and more! An overview of the three key pillars of the Pritikin Program will be covered: eating well, doing regular exercise, and having a healthy mind-set.  WORKSHOPS  Exercise: Exercise Basics: Building Your Action Plan Clinical staff led group instruction and group discussion with PowerPoint presentation and patient guidebook. To enhance the learning environment the use of posters, models and videos may be added. At the conclusion of this workshop, patients will comprehend the difference between physical activity and exercise, as well as the benefits of incorporating both, into their routine. Patients will understand the FITT (Frequency, Intensity, Time, and Type) principle and how to use it to build an exercise action plan. In addition, safety concerns and other considerations for exercise and cardiac rehab will be addressed by the presenter. The purpose of this lesson is to promote a comprehensive and effective weekly exercise routine in order to improve patients' overall level of fitness.   Managing Heart Disease: Your Path to a Healthier Heart Clinical staff led group instruction and group discussion with PowerPoint presentation and patient guidebook. To enhance the learning environment the use of posters, models and videos may be added.At the conclusion of this workshop, patients will understand the anatomy and physiology of the heart. Additionally, they will understand how Pritikin's three pillars impact the risk factors, the progression, and the management of heart disease.  The purpose of this lesson is to provide a high-level overview of the heart, heart disease, and how the Pritikin lifestyle positively impacts risk  factors.  Exercise Biomechanics Clinical staff led group instruction and group discussion with PowerPoint presentation and patient guidebook. To enhance the learning environment the use of posters, models and videos may be added. Patients will learn how the structural parts of their bodies function and how these functions impact their daily activities, movement, and exercise. Patients will learn how to promote a neutral spine, learn how to manage pain, and identify ways to improve their physical movement in order to promote healthy living. The purpose of this lesson is to expose patients to common physical limitations that impact physical activity. Participants will learn practical ways to adapt and manage aches and pains, and to minimize their effect on regular exercise. Patients will learn how to maintain good posture while sitting, walking, and lifting.  Balance Training  and Fall Prevention  Clinical staff led group instruction and group discussion with PowerPoint presentation and patient guidebook. To enhance the learning environment the use of posters, models and videos may be added. At the conclusion of this workshop, patients will understand the importance of their sensorimotor skills (vision, proprioception, and the vestibular system) in maintaining their ability to balance as they age. Patients will apply a variety of balancing exercises that are appropriate for their current level of function. Patients will understand the common causes for poor balance, possible solutions to these problems, and ways to modify their physical environment in order to minimize their fall risk. The purpose of this lesson is to teach patients about the importance of maintaining balance as they age and ways to minimize their risk of falling.  WORKSHOPS   Nutrition:  Fueling a Scientist, research (physical sciences) led group instruction and group discussion with PowerPoint presentation and patient guidebook. To enhance  the learning environment the use of posters, models and videos may be added. Patients will review the foundational principles of the [place] and understand what constitutes a serving size in each of the food groups. Patients will also learn Pritikin-friendly foods that are better choices when away from home and review make-ahead meal and snack options. Calorie density will be reviewed and applied to three nutrition priorities: weight maintenance, weight loss, and weight gain. The purpose of this lesson is to reinforce (in a group setting) the key concepts around what patients are recommended to eat and how to apply these guidelines when away from home by planning and selecting Pritikin-friendly options. Patients will understand how calorie density may be adjusted for different weight management goals.  Mindful Eating  Clinical staff led group instruction and group discussion with PowerPoint presentation and patient guidebook. To enhance the learning environment the use of posters, models and videos may be added. Patients will briefly review the concepts of the [place] and the importance of low-calorie dense foods. The concept of mindful eating will be introduced as well as the importance of paying attention to internal hunger signals. Triggers for non-hunger eating and techniques for dealing with triggers will be explored. The purpose of this lesson is to provide patients with the opportunity to review the basic principles of the [place], discuss the value of eating mindfully and how to measure internal cues of hunger and fullness using the Hunger Scale. Patients will also discuss reasons for non-hunger eating and learn strategies to use for controlling emotional eating.  Targeting Your Nutrition Priorities Clinical staff led group instruction and group discussion with PowerPoint presentation and patient guidebook. To enhance the learning environment the use of posters,  models and videos may be added. Patients will learn how to determine their genetic susceptibility to disease by reviewing their family history. Patients will gain insight into the importance of diet as part of an overall healthy lifestyle in mitigating the impact of genetics and other environmental insults. The purpose of this lesson is to provide patients with the opportunity to assess their personal nutrition priorities by looking at their family history, their own health history and current risk factors. Patients will also be able to discuss ways of prioritizing and modifying the [place] for their highest risk areas  Menu  Clinical staff led group instruction and group discussion with PowerPoint presentation and patient guidebook. To enhance the learning environment the use of posters, models and videos may be added. Using menus brought in from local  restaurants, or printed from [employer], patients will apply the [place] dining out guidelines that were presented in the [employer] video. Patients will also be able to practice these guidelines in a variety of provided scenarios. The purpose of this lesson is to provide patients with the opportunity to practice hands-on learning of the [place] with actual menus and practice scenarios.  Label Reading Clinical staff led group instruction and group discussion with PowerPoint presentation and patient guidebook. To enhance the learning environment the use of posters, models and videos may be added. Patients will review and discuss the [facility] label reading guidelines presented in [employer]'s Label Reading Educational series video. Using fool labels brought in from local grocery stores and markets, patients will apply the label reading guidelines and determine if the packaged food meet the [facility] guidelines. The purpose of this lesson is to provide patients with the opportunity to review, discuss, and  practice hands-on learning of the [facility] Label Reading guidelines with actual packaged food labels. [place] Workshops are designed to teach patients ways to prepare quick, simple, and affordable recipes at home. The importance of nutrition's role in chronic disease risk reduction is reflected in its emphasis in the overall [facility] program. By learning how to prepare essential core [facility] Eating Plan recipes, patients will increase control over what they eat; be able to customize the flavor of foods without the use of added salt, sugar, or fat; and improve the quality of the food they consume. By learning a set of core recipes which are easily assembled, quickly prepared, and affordable, patients are more likely to prepare more healthy foods at home. These workshops focus on convenient breakfasts, simple entres, side dishes, and desserts which can be prepared with minimal effort and are consistent with nutrition recommendations for cardiovascular risk reduction. Cooking [employer] are taught by a Engineer, materials (RD) who has been trained by the [employer]. The chef or RD has a clear understanding of the importance of minimizing - if not completely eliminating - added fat, sugar, and sodium in recipes. Throughout the series of [place] Workshop sessions, patients will learn about healthy ingredients and efficient methods of cooking to build confidence in their capability to prepare    Cooking School weekly topics:  Adding Flavor- Sodium-Free  Fast and Healthy Breakfasts  Powerhouse Plant-Based Proteins  Satisfying Salads and Dressings  Simple Sides and Sauces  International Cuisine-Spotlight on the [place] Zones  Delicious Desserts  Savory Soups  Efficiency Cooking - Meals in a Snap  Tasty Appetizers and Snacks  Comforting Weekend Breakfasts  One-Pot Wonders   Fast Evening Meals  Easy [place] (Psychosocial): New Thoughts, New Behaviors Clinical staff led group instruction and group discussion with PowerPoint presentation and patient guidebook. To enhance the learning environment the use of posters, models and videos may be added. Patients will learn and practice techniques for developing effective health and lifestyle goals. Patients will be able to effectively apply the goal setting process learned to develop at least one new personal goal.  The purpose of this lesson is to expose patients to a new skill set of behavior modification techniques such as techniques setting SMART goals, overcoming barriers, and achieving new thoughts and new behaviors.  Managing Moods and Relationships Clinical staff led group instruction and group discussion with PowerPoint presentation and patient guidebook. To enhance the learning  environment the use of posters, models and videos may be added. Patients will learn how emotional and chronic stress factors can impact their health and relationships. They will learn healthy ways to manage their moods and utilize positive coping mechanisms. In addition, ICR patients will learn ways to improve communication skills. The purpose of this lesson is to expose patients to ways of understanding how one's mood and health are intimately connected. Developing a healthy outlook can help build positive relationships and connections with others. Patients will understand the importance of utilizing effective communication skills that include actively listening and being heard. They will learn and understand the importance of the "4 Cs" and especially Connections in fostering of a Healthy Mind-Set.  Healthy Sleep for a Healthy Heart Clinical staff led group instruction and group discussion with PowerPoint presentation and patient guidebook. To enhance the learning environment the use of posters, models and videos may be added. At the  conclusion of this workshop, patients will be able to demonstrate knowledge of the importance of sleep to overall health, well-being, and quality of life. They will understand the symptoms of, and treatments for, common sleep disorders. Patients will also be able to identify daytime and nighttime behaviors which impact sleep, and they will be able to apply these tools to help manage sleep-related challenges. The purpose of this lesson is to provide patients with a general overview of sleep and outline the importance of quality sleep. Patients will learn about a few of the most common sleep disorders. Patients will also be introduced to the concept of "sleep hygiene," and discover ways to self-manage certain sleeping problems through simple daily behavior changes. Finally, the workshop will motivate patients by clarifying the links between quality sleep and their goals of heart-healthy living.   Recognizing and Reducing Stress Clinical staff led group instruction and group discussion with PowerPoint presentation and patient guidebook. To enhance the learning environment the use of posters, models and videos may be added. At the conclusion of this workshop, patients will be able to understand the types of stress reactions, differentiate between acute and chronic stress, and recognize the impact that chronic stress has on their health. They will also be able to apply different coping mechanisms, such as reframing negative self-talk. Patients will have the opportunity to practice a variety of stress management techniques, such as deep abdominal breathing, progressive muscle relaxation, and/or guided imagery.  The purpose of this lesson is to educate patients on the role of stress in their lives and to provide healthy techniques for coping with it.  Learning Barriers/Preferences:  Learning Barriers/Preferences - 10/23/21 1126       Learning Barriers/Preferences   Learning Barriers Exercise Concerns   Balance  concerns   Learning Preferences Written Material;Pictoral             Education Topics:  Knowledge Questionnaire Score:  Knowledge Questionnaire Score - 10/27/21 1416       Knowledge Questionnaire Score   Pre Score 19/24             Core Components/Risk Factors/Patient Goals at Admission:  Personal Goals and Risk Factors at Admission - 10/23/21 1129       Core Components/Risk Factors/Patient Goals on Admission    Weight Management Yes;Weight Maintenance    Intervention Weight Management: Develop a combined nutrition and exercise program designed to reach desired caloric intake, while maintaining appropriate intake of nutrient and fiber, sodium and fats, and appropriate energy expenditure required for the weight goal.;Weight Management: Provide education  and appropriate resources to help participant work on and attain dietary goals.;Weight Management/Obesity: Establish reasonable short term and long term weight goals.    Admit Weight 196 lb 6.9 oz (89.1 kg)    Expected Outcomes Short Term: Continue to assess and modify interventions until short term weight is achieved;Long Term: Adherence to nutrition and physical activity/exercise program aimed toward attainment of established weight goal;Weight Maintenance: Understanding of the daily nutrition guidelines, which includes 25-35% calories from fat, 7% or less cal from saturated fats, less than '200mg'$  cholesterol, less than 1.5gm of sodium, & 5 or more servings of fruits and vegetables daily;Understanding recommendations for meals to include 15-35% energy as protein, 25-35% energy from fat, 35-60% energy from carbohydrates, less than '200mg'$  of dietary cholesterol, 20-35 gm of total fiber daily;Understanding of distribution of calorie intake throughout the day with the consumption of 4-5 meals/snacks    Heart Failure Yes    Intervention Provide a combined exercise and nutrition program that is supplemented with education, support and  counseling about heart failure. Directed toward relieving symptoms such as shortness of breath, decreased exercise tolerance, and extremity edema.    Expected Outcomes Improve functional capacity of life;Short term: Attendance in program 2-3 days a week with increased exercise capacity. Reported lower sodium intake. Reported increased fruit and vegetable intake. Reports medication compliance.;Short term: Daily weights obtained and reported for increase. Utilizing diuretic protocols set by physician.;Long term: Adoption of self-care skills and reduction of barriers for early signs and symptoms recognition and intervention leading to self-care maintenance.    Hypertension Yes    Intervention Provide education on lifestyle modifcations including regular physical activity/exercise, weight management, moderate sodium restriction and increased consumption of fresh fruit, vegetables, and low fat dairy, alcohol moderation, and smoking cessation.;Monitor prescription use compliance.    Expected Outcomes Short Term: Continued assessment and intervention until BP is < 140/39m HG in hypertensive participants. < 130/871mHG in hypertensive participants with diabetes, heart failure or chronic kidney disease.;Long Term: Maintenance of blood pressure at goal levels.    Lipids Yes    Intervention Provide education and support for participant on nutrition & aerobic/resistive exercise along with prescribed medications to achieve LDL '70mg'$ , HDL >'40mg'$ .    Expected Outcomes Short Term: Participant states understanding of desired cholesterol values and is compliant with medications prescribed. Participant is following exercise prescription and nutrition guidelines.;Long Term: Cholesterol controlled with medications as prescribed, with individualized exercise RX and with personalized nutrition plan. Value goals: LDL < '70mg'$ , HDL > 40 mg.    Stress Yes    Intervention Offer individual and/or small group education and counseling on  adjustment to heart disease, stress management and health-related lifestyle change. Teach and support self-help strategies.;Refer participants experiencing significant psychosocial distress to appropriate mental health specialists for further evaluation and treatment. When possible, include family members and significant others in education/counseling sessions.    Expected Outcomes Short Term: Participant demonstrates changes in health-related behavior, relaxation and other stress management skills, ability to obtain effective social support, and compliance with psychotropic medications if prescribed.;Long Term: Emotional wellbeing is indicated by absence of clinically significant psychosocial distress or social isolation.    Personal Goal Other Yes    Personal Goal Short term: Flexibility and consult with RD Long term: maintain wt loss and stamina    Intervention Will continue to monitor pt and progress workloads as tolerated without sign or symptom    Expected Outcomes Pt will achieve his goals and gain knowledge and stamina  Core Components/Risk Factors/Patient Goals Review:   Goals and Risk Factor Review     Row Name 10/28/21 0824 11/04/21 1102 11/25/21 1510         Core Components/Risk Factors/Patient Goals Review   Personal Goals Review Weight Management/Obesity;Heart Failure;Stress;Hypertension;Lipids Weight Management/Obesity;Heart Failure;Stress;Hypertension;Lipids Weight Management/Obesity;Heart Failure;Stress;Hypertension;Lipids     Review Corin started intensive caridac rehab on 10/27/21 and did well with exercise. Vital signs were stable. Taggert started intensive cardiac rehab on 10/27/21. Noted that patient was recently started on Entresto will continue to monitor BP. Yuvaan is off to a good start to exercise. Bharat has been doing well with exercise. Vital signs have been stable. Jamen has increased his workloads     Expected Outcomes Denham will contibue to participate  in intensive cardiac rehab for exercise, nutrition and lifestyle modifications Mathius will contibue to participate in intensive cardiac rehab for exercise, nutrition and lifestyle modifications Zubair will contibue to participate in intensive cardiac rehab for exercise, nutrition and lifestyle modifications              Core Components/Risk Factors/Patient Goals at Discharge (Final Review):   Goals and Risk Factor Review - 11/25/21 1510       Core Components/Risk Factors/Patient Goals Review   Personal Goals Review Weight Management/Obesity;Heart Failure;Stress;Hypertension;Lipids    Review Shey has been doing well with exercise. Vital signs have been stable. Lavarr has increased his workloads    Expected Outcomes Trevin will contibue to participate in intensive cardiac rehab for exercise, nutrition and lifestyle modifications             ITP Comments:  ITP Comments     Row Name 10/23/21 1018 10/28/21 0821 11/04/21 1101 11/25/21 1507     ITP Comments Dr Fransico Him MD, Medical Director, Introduction to [facility] Education Program/ Intensive Cardiac Rehab. Initial Orientation Packet Reviewed with the Patient 30 Day ITP Review. Michae started intensive cardiac rehab on 10/28/21 and did well with exercise 30 Day ITP Review. Huber is off to a good start to exercise at  intensive cardiac rehab 30 Day ITP Review. Braxxton has good attendance and participation in intensive cardiac rehab.             Comments: See ITP comments.Harrell Gave RN BSN

## 2021-11-25 NOTE — Telephone Encounter (Signed)
Spoke with daughter will need clearance from surgeon to return to exercise. Patient has follow up on 12/03/21. Barnet Pall, RN,BSN 11/25/2021 4:00 PM

## 2021-11-26 ENCOUNTER — Encounter (HOSPITAL_COMMUNITY): Payer: Medicare Other

## 2021-11-28 ENCOUNTER — Encounter (HOSPITAL_COMMUNITY): Payer: Self-pay | Admitting: Urology

## 2021-11-28 ENCOUNTER — Encounter (HOSPITAL_COMMUNITY): Payer: Medicare Other

## 2021-11-28 DIAGNOSIS — R3914 Feeling of incomplete bladder emptying: Secondary | ICD-10-CM | POA: Diagnosis not present

## 2021-12-01 ENCOUNTER — Other Ambulatory Visit: Payer: Self-pay | Admitting: Internal Medicine

## 2021-12-01 ENCOUNTER — Encounter (HOSPITAL_COMMUNITY): Payer: Medicare Other

## 2021-12-01 ENCOUNTER — Telehealth (HOSPITAL_COMMUNITY): Payer: Self-pay | Admitting: *Deleted

## 2021-12-01 NOTE — Telephone Encounter (Signed)
Spoke with Jahseh he has an appointment with the urologist on Wednesday and hopes to return to exercise on Friday.Barnet Pall, RN,BSN 12/01/2021 11:07 AM

## 2021-12-03 ENCOUNTER — Encounter (HOSPITAL_COMMUNITY): Payer: Medicare Other

## 2021-12-03 DIAGNOSIS — D414 Neoplasm of uncertain behavior of bladder: Secondary | ICD-10-CM | POA: Diagnosis not present

## 2021-12-03 DIAGNOSIS — N3020 Other chronic cystitis without hematuria: Secondary | ICD-10-CM | POA: Diagnosis not present

## 2021-12-03 DIAGNOSIS — R310 Gross hematuria: Secondary | ICD-10-CM | POA: Diagnosis not present

## 2021-12-03 DIAGNOSIS — R300 Dysuria: Secondary | ICD-10-CM | POA: Diagnosis not present

## 2021-12-05 ENCOUNTER — Other Ambulatory Visit (HOSPITAL_BASED_OUTPATIENT_CLINIC_OR_DEPARTMENT_OTHER): Payer: Self-pay

## 2021-12-05 ENCOUNTER — Encounter (HOSPITAL_COMMUNITY): Payer: Medicare Other

## 2021-12-05 MED ORDER — COMIRNATY 30 MCG/0.3ML IM SUSY
PREFILLED_SYRINGE | INTRAMUSCULAR | 0 refills | Status: DC
  Filled 2021-12-05: qty 0.3, 1d supply, fill #0

## 2021-12-08 ENCOUNTER — Telehealth (HOSPITAL_COMMUNITY): Payer: Self-pay | Admitting: *Deleted

## 2021-12-08 ENCOUNTER — Encounter (HOSPITAL_COMMUNITY)
Admission: RE | Admit: 2021-12-08 | Discharge: 2021-12-08 | Disposition: A | Discharge status: Home / Self Care | Payer: Medicare Other | Source: Ambulatory Visit | Attending: Internal Medicine | Admitting: Internal Medicine

## 2021-12-08 DIAGNOSIS — Z951 Presence of aortocoronary bypass graft: Secondary | ICD-10-CM | POA: Diagnosis not present

## 2021-12-08 DIAGNOSIS — Z952 Presence of prosthetic heart valve: Secondary | ICD-10-CM

## 2021-12-08 DIAGNOSIS — I10 Essential (primary) hypertension: Secondary | ICD-10-CM | POA: Diagnosis not present

## 2021-12-08 DIAGNOSIS — D494 Neoplasm of unspecified behavior of bladder: Secondary | ICD-10-CM | POA: Diagnosis not present

## 2021-12-08 DIAGNOSIS — I2510 Atherosclerotic heart disease of native coronary artery without angina pectoris: Secondary | ICD-10-CM | POA: Diagnosis not present

## 2021-12-08 DIAGNOSIS — Z87891 Personal history of nicotine dependence: Secondary | ICD-10-CM | POA: Diagnosis not present

## 2021-12-08 DIAGNOSIS — I4510 Unspecified right bundle-branch block: Secondary | ICD-10-CM | POA: Diagnosis not present

## 2021-12-08 NOTE — Telephone Encounter (Signed)
Called patient to check progress with recovery. Patient will be at cardiac rehab today for exercise.

## 2021-12-08 NOTE — Progress Notes (Signed)
Abundio returned to cardiac rehab today per Dr Gloriann Loan urologist. Frequent [place] and couplets noted initially. Patient asymptomatic. BP 122/68 heart rate 90. Patient has a history of frequent PVC's. Couplets and frequent PVC's seemed to decrease some with exercise. Dr Harrington Challenger office was called and notified spoke with Rockney Ghee RN.Will fax exercise flow sheets to Dr. Alan Ripper  office for review.Will continue to monitor the patient throughout  the [place] RN BSN

## 2021-12-09 ENCOUNTER — Telehealth (HOSPITAL_COMMUNITY): Payer: Self-pay | Admitting: *Deleted

## 2021-12-09 NOTE — Telephone Encounter (Signed)
-----   Message from Fay Records, MD sent at 12/08/2021  6:18 PM EDT ----- Keep following    Last BMET was in beginning of October 2023   Good  Follow   If continues will check that as well as Mg

## 2021-12-10 ENCOUNTER — Telehealth: Payer: Self-pay | Admitting: Internal Medicine

## 2021-12-10 ENCOUNTER — Encounter (HOSPITAL_COMMUNITY)
Admission: RE | Admit: 2021-12-10 | Discharge: 2021-12-10 | Disposition: A | Discharge status: Home / Self Care | Payer: Medicare Other | Source: Ambulatory Visit | Attending: Internal Medicine | Admitting: Internal Medicine

## 2021-12-10 ENCOUNTER — Encounter: Payer: Self-pay | Admitting: Internal Medicine

## 2021-12-10 DIAGNOSIS — I4510 Unspecified right bundle-branch block: Secondary | ICD-10-CM | POA: Diagnosis not present

## 2021-12-10 DIAGNOSIS — D494 Neoplasm of unspecified behavior of bladder: Secondary | ICD-10-CM | POA: Diagnosis not present

## 2021-12-10 DIAGNOSIS — I10 Essential (primary) hypertension: Secondary | ICD-10-CM | POA: Diagnosis not present

## 2021-12-10 DIAGNOSIS — I2510 Atherosclerotic heart disease of native coronary artery without angina pectoris: Secondary | ICD-10-CM | POA: Diagnosis not present

## 2021-12-10 DIAGNOSIS — Z952 Presence of prosthetic heart valve: Secondary | ICD-10-CM | POA: Diagnosis not present

## 2021-12-10 DIAGNOSIS — Z951 Presence of aortocoronary bypass graft: Secondary | ICD-10-CM | POA: Diagnosis not present

## 2021-12-10 DIAGNOSIS — Z87891 Personal history of nicotine dependence: Secondary | ICD-10-CM | POA: Diagnosis not present

## 2021-12-10 NOTE — Telephone Encounter (Signed)
Patient stated he is in rehab now and would like to speak with RN Rockney Ghee.

## 2021-12-10 NOTE — Progress Notes (Signed)
Letter  

## 2021-12-10 NOTE — Telephone Encounter (Signed)
Pt called to ask Dr Harrington Challenger if she would approve then write a letter stating he can:   After Cardiac Rehab ends on 01/07/22... he can start a "PREP" class at the [facility] for exercise and education on how to use all of the machines... it will be for 3 months starting in December but he will need a letter saying he can participate.   He sees Dr Lovena Le on 01/01/22.   Will forward to Dr Harrington Challenger for her review.

## 2021-12-12 ENCOUNTER — Encounter (HOSPITAL_COMMUNITY): Payer: Medicare Other

## 2021-12-15 ENCOUNTER — Encounter (HOSPITAL_COMMUNITY): Payer: Medicare Other

## 2021-12-15 ENCOUNTER — Telehealth: Payer: Self-pay | Admitting: Family Medicine

## 2021-12-15 NOTE — Telephone Encounter (Signed)
Spoke with patient to schedule Medicare Annual Wellness Visit (AWV) either virtually or in office. Left  my Herbie Drape number [phone]  He wanted a call back end of week to schedule     awvs per [facility] 08/17/11  please schedule with Nurse Health Adviser   45 min for awv-i and in office appointments 30 min for awv-s  phone/virtual appointments

## 2021-12-17 ENCOUNTER — Encounter (HOSPITAL_COMMUNITY): Payer: Medicare Other

## 2021-12-17 ENCOUNTER — Telehealth (HOSPITAL_COMMUNITY): Payer: Self-pay | Admitting: *Deleted

## 2021-12-17 ENCOUNTER — Telehealth (HOSPITAL_COMMUNITY): Payer: Self-pay | Admitting: Family Medicine

## 2021-12-17 NOTE — Telephone Encounter (Signed)
Left message to call cardiac rehab.Barnet Pall, RN,BSN 12/17/2021 9:46 AM

## 2021-12-19 ENCOUNTER — Encounter (HOSPITAL_COMMUNITY)
Admission: RE | Admit: 2021-12-19 | Discharge: 2021-12-19 | Disposition: A | Discharge status: Home / Self Care | Payer: Medicare Other | Source: Ambulatory Visit | Attending: Internal Medicine | Admitting: Internal Medicine

## 2021-12-19 DIAGNOSIS — I493 Ventricular premature depolarization: Secondary | ICD-10-CM | POA: Diagnosis not present

## 2021-12-19 DIAGNOSIS — Z79899 Other long term (current) drug therapy: Secondary | ICD-10-CM | POA: Insufficient documentation

## 2021-12-19 DIAGNOSIS — E669 Obesity, unspecified: Secondary | ICD-10-CM | POA: Diagnosis not present

## 2021-12-19 DIAGNOSIS — Z952 Presence of prosthetic heart valve: Secondary | ICD-10-CM | POA: Insufficient documentation

## 2021-12-19 DIAGNOSIS — E785 Hyperlipidemia, unspecified: Secondary | ICD-10-CM | POA: Diagnosis not present

## 2021-12-19 DIAGNOSIS — I2510 Atherosclerotic heart disease of native coronary artery without angina pectoris: Secondary | ICD-10-CM | POA: Diagnosis not present

## 2021-12-19 DIAGNOSIS — I739 Peripheral vascular disease, unspecified: Secondary | ICD-10-CM | POA: Diagnosis not present

## 2021-12-19 DIAGNOSIS — I10 Essential (primary) hypertension: Secondary | ICD-10-CM | POA: Insufficient documentation

## 2021-12-19 DIAGNOSIS — Z951 Presence of aortocoronary bypass graft: Secondary | ICD-10-CM | POA: Insufficient documentation

## 2021-12-22 ENCOUNTER — Encounter (HOSPITAL_COMMUNITY)
Admission: RE | Admit: 2021-12-22 | Discharge: 2021-12-22 | Disposition: A | Discharge status: Home / Self Care | Payer: Medicare Other | Source: Ambulatory Visit | Attending: Internal Medicine | Admitting: Internal Medicine

## 2021-12-22 DIAGNOSIS — E785 Hyperlipidemia, unspecified: Secondary | ICD-10-CM | POA: Diagnosis not present

## 2021-12-22 DIAGNOSIS — Z951 Presence of aortocoronary bypass graft: Secondary | ICD-10-CM | POA: Diagnosis not present

## 2021-12-22 DIAGNOSIS — I493 Ventricular premature depolarization: Secondary | ICD-10-CM | POA: Diagnosis not present

## 2021-12-22 DIAGNOSIS — Z952 Presence of prosthetic heart valve: Secondary | ICD-10-CM

## 2021-12-22 DIAGNOSIS — I2510 Atherosclerotic heart disease of native coronary artery without angina pectoris: Secondary | ICD-10-CM | POA: Diagnosis not present

## 2021-12-22 DIAGNOSIS — I10 Essential (primary) hypertension: Secondary | ICD-10-CM | POA: Diagnosis not present

## 2021-12-22 DIAGNOSIS — Z79899 Other long term (current) drug therapy: Secondary | ICD-10-CM | POA: Diagnosis not present

## 2021-12-23 NOTE — Progress Notes (Signed)
Cardiac Individual Treatment Plan  Patient Details  Name: Luis Herrera MRN: [id number] Date of Birth: [birth year] Referring Provider:   Flowsheet Row INTENSIVE CARDIAC REHAB ORIENT from 10/23/2021 in [facility]  Referring Provider Dr. Lenna Sciara, MD       Initial Encounter Date:  [facility] from 10/23/2021 in [facility]  Date 10/23/21       Visit Diagnosis: 09/30/21 S/P TAVR   Patient's Home Medications on Admission:  Current Outpatient Medications:    amiodarone (PACERONE) 200 MG tablet, TAKE ONE-HALF TABLET BY MOUTH  DAILY, Disp: 45 tablet, Rfl: 3   amLODipine (NORVASC) 5 MG tablet, Take 1 tablet (5 mg total) by mouth daily., Disp: 90 tablet, Rfl: 3   amoxicillin-clavulanate (AUGMENTIN) 875-125 MG tablet, Take 1 tablet by mouth 2 (two) times daily., Disp: , Rfl:    aspirin EC 81 MG tablet, Take 1 tablet (81 mg total) by mouth daily. Swallow whole., Disp: 90 tablet, Rfl: 3   atorvastatin (LIPITOR) 80 MG tablet, TAKE 1 TABLET BY MOUTH ONCE  DAILY, Disp: 90 tablet, Rfl: 3   carvedilol (COREG) 6.25 MG tablet, TAKE 1 TABLET BY MOUTH TWICE  DAILY, Disp: 180 tablet, Rfl: 2   ezetimibe (ZETIA) 10 MG tablet, TAKE 1 TABLET BY MOUTH DAILY, Disp: 90 tablet, Rfl: 3   furosemide (LASIX) 20 MG tablet, Take 1 tablet (20 mg total) by mouth daily. (Patient taking differently: Take 20 mg by mouth as needed for fluid or edema.), Disp: , Rfl:    levothyroxine (SYNTHROID) 50 MCG tablet, TAKE 1 TABLET BY MOUTH DAILY  BEFORE BREAKFAST, Disp: 90 tablet, Rfl: 3   Multiple Vitamins-Minerals (PRESERVISION AREDS 2) CAPS, Take 1 capsule by mouth 2 (two) times daily., Disp: , Rfl:    nitrofurantoin, macrocrystal-monohydrate, (MACROBID) 100 MG capsule, Take 100 mg by mouth at bedtime., Disp: , Rfl:    sacubitril-valsartan (ENTRESTO) 24-26 MG, Take 1 tablet by mouth 2 (two) times daily., Disp: 60 tablet, Rfl: 11    tamsulosin (FLOMAX) 0.4 MG CAPS capsule, Take 0.4 mg by mouth daily., Disp: , Rfl:   Past Medical History: Past Medical History:  Diagnosis Date   Aortic stenosis 2008   Arthritis    Blood transfusion    Blood transfusion without reported diagnosis    CAD (coronary artery disease) 2008   Cataract    Complication of anesthesia    hallucinated once   H/O hiatal hernia    Heart murmur    AFTER REPLACED VALVE WENT AWAY   Hx of colonic polyps    Hyperlipidemia    Hypertension    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 TAli Loweand Dr. BCyndia Bent  Thyroid disease     Tobacco Use: Social History   Tobacco Use  Smoking Status Former   Types: Cigarettes   Quit date: 05/25/1973   Years since quitting: 48.6  Smokeless Tobacco Never    Labs: Review Flowsheet  More data exists      Latest Ref Rng & Units 12/13/2018 12/18/2019 09/24/2021 09/29/2021 09/30/2021  Labs for ITP Cardiac and Pulmonary Rehab  Cholestrol 100 - 199 mg/dL 144  150  - - -  LDL (calc) 0 - 99 mg/dL 73  78  - - -  HDL-C >39 mg/dL 58  54  - - -  Trlycerides 0 - 149 mg/dL 64  96  - - -  Hemoglobin A1c 4.8 - 5.6 % - - - 5.4  -  PH, Arterial 7.35 - 7.45 - - 7.428  7.5  -  PCO2 arterial 32 - 48 mmHg - - 36.3  41  -  Bicarbonate 20.0 - 28.0 mmol/L - - 25.0  26.0  24.0  32.0  -  TCO2 22 - 32 mmol/L - - '26  27  25  '$ - 26  22   O2 Saturation % - - 68  70  92  98.3  -    Capillary Blood Glucose: No results found for: "GLUCAP"   Exercise Target Goals: Exercise Program Goal: Individual exercise prescription set using results from initial 6 min walk test and THRR while considering  patient's activity barriers and safety.   Exercise Prescription Goal: Initial exercise prescription builds to 30-45 minutes a day of aerobic activity, 2-3 days per week.  Home exercise guidelines will be given to patient during program as part of exercise prescription that the participant  will acknowledge.  Activity Barriers & Risk Stratification:  Activity Barriers & Cardiac Risk Stratification - 10/23/21 1111       Activity Barriers & Cardiac Risk Stratification   Activity Barriers Left Knee Replacement;Right Knee Replacement;Left Hip Replacement;Deconditioning;Joint Problems;Balance Concerns;Decreased Ventricular Function    Cardiac Risk Stratification High             6 Minute Walk:  6 Minute Walk     Row Name 10/23/21 1106         6 Minute Walk   Phase Initial     Distance 1098 feet     Walk Time 6 minutes     # of Rest Breaks 0     MPH 2.08     METS 1.73     RPE 10     Perceived Dyspnea  0     VO2 Peak 6.06     Symptoms No     Resting HR 61 bpm     Resting BP 118/50     Resting Oxygen Saturation  98 %     Exercise Oxygen Saturation  during 6 min walk 97 %     Max Ex. HR 80 bpm     Max Ex. BP 128/52     2 Minute Post BP 148/62              Oxygen Initial Assessment:   Oxygen Re-Evaluation:   Oxygen Discharge (Final Oxygen Re-Evaluation):   Initial Exercise Prescription:  Initial Exercise Prescription - 10/23/21 1100       Date of Initial Exercise RX and Referring Provider   Date 10/23/21    Referring Provider Dr. Lenna Sciara, MD    Expected Discharge Date 12/19/21      NuStep   Level 1    SPM 75    Minutes 25    METs 1.5      Prescription Details   Frequency (times per week) 3    Duration Progress to 30 minutes of continuous aerobic without signs/symptoms of physical distress      Intensity   THRR 40-80% of Max Heartrate 56-134    Ratings of Perceived Exertion 11-13    Perceived Dyspnea 0-4      Progression   Progression Continue progressive overload as per policy without signs/symptoms or physical distress.      Resistance Training   Training Prescription Yes    Weight 3    Reps 10-15  Perform Capillary Blood Glucose checks as needed.  Exercise Prescription Changes:   Exercise  Prescription Changes     Row Name 10/27/21 1100 11/03/21 1024 11/14/21 1011 11/21/21 1018 12/08/21 1018     Response to Exercise   Blood Pressure (Admit) 128/50 98/72 100/58 112/70 122/62   Blood Pressure (Exercise) 124/60 118/60 112/52 120/64 122/68   Blood Pressure (Exit) 126/70 101/74 108/56 98/58 106/57   Heart Rate (Admit) 65 bpm 67 bpm 65 bpm 65 bpm 65 bpm   Heart Rate (Exercise) 82 bpm 87 bpm 93 bpm 89 bpm 92 bpm   Heart Rate (Exit) 65 bpm 70 bpm 62 bpm 65 bpm 69 bpm   Rating of Perceived Exertion (Exercise) '11 12 11 14 12   '$ Symptoms None None None None None   Comments Pt's first day in the CRP2 program -- -- -- First day back since bladder surgery. Tolerated exercise well.   Duration Progress to 30 minutes of  aerobic without signs/symptoms of physical distress Progress to 30 minutes of  aerobic without signs/symptoms of physical distress Progress to 30 minutes of  aerobic without signs/symptoms of physical distress Progress to 30 minutes of  aerobic without signs/symptoms of physical distress Progress to 30 minutes of  aerobic without signs/symptoms of physical distress   Intensity THRR unchanged THRR unchanged THRR unchanged THRR unchanged THRR unchanged     Progression   Progression Continue to progress workloads to maintain intensity without signs/symptoms of physical distress. Continue to progress workloads to maintain intensity without signs/symptoms of physical distress. Continue to progress workloads to maintain intensity without signs/symptoms of physical distress. Continue to progress workloads to maintain intensity without signs/symptoms of physical distress. Continue to progress workloads to maintain intensity without signs/symptoms of physical distress.   Average METs 1.8 2 2.6 2.5 2.4     Resistance Training   Training Prescription Yes Yes Yes Yes Yes   Weight '3 3 4 '$ lbs 4 lbs 4 lbs   Reps 10-15 10-15 10-15 10-15 10-15   Time 10 Minutes 10 Minutes 10 Minutes 10 Minutes  10 Minutes     Interval Training   Interval Training No No No No No     NuStep   Level '1 1 2 2 2   '$ SPM 69 80 85 106 106   Minutes '25 25 25 20 25   '$ METs 1.8 2 2.6 2.4 2.6     Track   Laps -- -- -- 9 7   Minutes -- -- -- 10 10   METs -- -- -- 2.57 2.22     Home Exercise Plan   Plans to continue exercise at -- -- [facility] (comment)  [facility]  [facility]  [facility]   Frequency -- -- Add 2 additional days to program exercise sessions. Add 2 additional days to program exercise sessions. Add 2 additional days to program exercise sessions.   Initial Home Exercises Provided -- -- 11/07/21 11/07/21 11/07/21    Row Name 12/19/21 1021             Response to Exercise   Blood Pressure (Admit) 126/60       Blood Pressure (Exercise) 128/62       Blood Pressure (Exit) 117/65       Heart Rate (Admit) 59 bpm       Heart Rate (Exercise) 94  bpm       Heart Rate (Exit) 69 bpm       Rating of Perceived Exertion (Exercise) 11       Symptoms None       Duration Progress to 30 minutes of  aerobic without signs/symptoms of physical distress       Intensity THRR unchanged         Progression   Progression Continue to progress workloads to maintain intensity without signs/symptoms of physical distress.       Average METs 2.3         Resistance Training   Training Prescription Yes       Weight 4 lbs       Reps 10-15       Time 10 Minutes         Interval Training   Interval Training No         NuStep   Level 2       SPM 95       Minutes 20       METs 2.2         Track   Laps 8  1665 ft, new track, shorter distance/lap       Minutes 10       METs 2.45         Home Exercise Plan   Plans to continue exercise at [facility] (comment)  [facility]       Frequency Add 2 additional days to program exercise  sessions.       Initial Home Exercises Provided 11/07/21                Exercise Comments:   Exercise Comments     Row Name 10/27/21 1154 11/03/21 1057 11/07/21 1111 12/08/21 1147 12/22/21 1107   Exercise Comments Pt's first day in the CRP2 program. No complaints with todays session. Reviewed METs with patient. Reviewed home exercise guidelines and goals with patient. Reviewed METs and goals with patient. Tolerated exercise well with some ectopy, asymptomatic. Reviewed METs and goals with patient on 12/19/21.    [place] Name 12/22/21 1130           Exercise Comments Patient requested to walk the track 30 minutes instead of exercising on the NuStep machine because he feels exercise on the machine is why his blood pressure has been low. Patient tolerated walking on the track well without symptoms, BP dropped but within normal limits.Patient would like to continue walking the track 30 minutes. Exercise prescription changed.                Exercise Goals and Review:   Exercise Goals     Row Name 10/23/21 1119             Exercise Goals   Increase Physical Activity Yes       Intervention Provide advice, education, support and counseling about physical activity/exercise needs.;Develop an individualized exercise prescription for aerobic and resistive training based on initial evaluation findings, risk stratification, comorbidities and participant's personal goals.       Expected Outcomes Short Term: Attend rehab on a regular basis to increase amount of physical activity.;Long Term: Add in home exercise to make exercise part of routine and to increase amount of physical activity.;Long Term: Exercising regularly at least 3-5 days a week.       Increase Strength and Stamina Yes       Intervention Provide advice, education,  support and counseling about physical activity/exercise needs.;Develop an individualized exercise prescription for aerobic and resistive training based on initial  evaluation findings, risk stratification, comorbidities and participant's personal goals.       Expected Outcomes Short Term: Increase workloads from initial exercise prescription for resistance, speed, and METs.;Short Term: Perform resistance training exercises routinely during rehab and add in resistance training at home;Long Term: Improve cardiorespiratory fitness, muscular endurance and strength as measured by increased METs and functional capacity (6MWT)       Able to understand and use rate of perceived exertion (RPE) scale Yes       Intervention Provide education and explanation on how to use RPE scale       Expected Outcomes Short Term: Able to use RPE daily in rehab to express subjective intensity level;Long Term:  Able to use RPE to guide intensity level when exercising independently       Knowledge and understanding of Target Heart Rate Range (THRR) Yes       Intervention Provide education and explanation of THRR including how the numbers were predicted and where they are located for reference       Expected Outcomes Short Term: Able to state/look up THRR;Long Term: Able to use THRR to govern intensity when exercising independently;Short Term: Able to use daily as guideline for intensity in rehab       Understanding of Exercise Prescription Yes       Intervention Provide education, explanation, and written materials on patient's individual exercise prescription       Expected Outcomes Short Term: Able to explain program exercise prescription;Long Term: Able to explain home exercise prescription to exercise independently                Exercise Goals Re-Evaluation :  Exercise Goals Re-Evaluation     [place] Name 10/27/21 1153 11/07/21 1102 12/08/21 1147 12/19/21 1107       Exercise Goal Re-Evaluation   Exercise Goals Review Increase Physical Activity;Increase Strength and Stamina;Able to understand and use rate of perceived exertion (RPE) scale;Knowledge and understanding of Target  Heart Rate Range (THRR);Understanding of Exercise Prescription Increase Physical Activity;Increase Strength and Stamina;Able to understand and use rate of perceived exertion (RPE) scale;Knowledge and understanding of Target Heart Rate Range (THRR);Understanding of Exercise Prescription;Able to check pulse independently Increase Physical Activity;Increase Strength and Stamina;Able to understand and use rate of perceived exertion (RPE) scale;Knowledge and understanding of Target Heart Rate Range (THRR);Understanding of Exercise Prescription;Able to check pulse independently Increase Physical Activity;Increase Strength and Stamina;Able to understand and use rate of perceived exertion (RPE) scale;Knowledge and understanding of Target Heart Rate Range (THRR);Understanding of Exercise Prescription;Able to check pulse independently    Comments Pt's first day in the CRP2 program. Pt understands the exercise Rx, THRR and RPE scale. [facility] and using the equipment as his mode of home exercise. Patient currently averages 3000 steps/day. Patient is able to manually check his pulse. Patient returned to exercise after bladder procedure. Tolerated exercise well and states he feels better than before his absence. Patient states he was able to tolerated exercise without taking a rest break, which he usually has to do. Patient is making gradual progress with exercise. [facility] is [facility] and is scheduled to start the P.R.E.P. program on 01/19/22 after completion of the cardiac rehab program.    Expected Outcomes Will continue to montior patient and progress exercise workloasa as tolerated.  Patient will [facility] 30 minutes at least 2 days/week in addition to exercise at cardiac rehab to achieve 150 minutes of aerobic exercise/week. Progress workloads as tolerated to help increase strength and stamina. Patient will continue walking 2  days/week in addition to exercise at cardiac rehab.             Discharge Exercise Prescription (Final Exercise Prescription Changes):  Exercise Prescription Changes - 12/19/21 1021       Response to Exercise   Blood Pressure (Admit) 126/60    Blood Pressure (Exercise) 128/62    Blood Pressure (Exit) 117/65    Heart Rate (Admit) 59 bpm    Heart Rate (Exercise) 94 bpm    Heart Rate (Exit) 69 bpm    Rating of Perceived Exertion (Exercise) 11    Symptoms None    Duration Progress to 30 minutes of  aerobic without signs/symptoms of physical distress    Intensity THRR unchanged      Progression   Progression Continue to progress workloads to maintain intensity without signs/symptoms of physical distress.    Average METs 2.3      Resistance Training   Training Prescription Yes    Weight 4 lbs    Reps 10-15    Time 10 Minutes      Interval Training   Interval Training No      NuStep   Level 2    SPM 95    Minutes 20    METs 2.2      Track   Laps 8   1665 ft, new track, shorter distance/lap   Minutes 10    METs 2.45      Home Exercise Plan   Plans to continue exercise at [facility] (comment)   [facility]   Frequency Add 2 additional days to program exercise sessions.    Initial Home Exercises Provided 11/07/21             Nutrition:  Target Goals: Understanding of nutrition guidelines, daily intake of sodium '1500mg'$ , cholesterol '200mg'$ , calories 30% from fat and 7% or less from saturated fats, daily to have 5 or more servings of fruits and vegetables.  Biometrics:  Pre Biometrics - 10/23/21 1120       Pre Biometrics   Waist Circumference 39.5 inches    Hip Circumference 40 inches    Waist to Hip Ratio 0.99 %    Triceps Skinfold 16 mm    % Body Fat 28.4 %    Grip Strength 32 kg    Flexibility --   pt unable to reach   Single Leg Stand 4.2 seconds              Nutrition Therapy Plan and Nutrition Goals:   Nutrition Therapy & Goals - 12/22/21 1602       Nutrition Therapy   Diet Heart Healthy Diet    Drug/Food Interactions Statins/Certain Fruits      Personal Nutrition Goals   Nutrition Goal Patient to understand strategies for reducing cardiovascular risk by attending the [employer] education and nutrition classes    Personal Goal #2 Patient to build a healthy plate daily with variety of fruits, vegetables, whole grains, lean protein/plant protein and nonfat/lowfat dairy.    Personal Goal #3 Patient to limit to '1500mg'$  of sodium daily.    Comments Goals in progress. Chima continues to attend the [employer] and nutrition series. He continues to focus on weight loss  with goal of ~185#. He has eliminated/reduced many carbohydrates and increased lean protein as a strategy for weight loss. He continues to read foods labels for sodium and sugar. His family remains extremly supportive of making lifestyle changes.      Intervention Plan   Intervention Prescribe, educate and counsel regarding individualized specific dietary modifications aiming towards targeted core components such as weight, hypertension, lipid management, diabetes, heart failure and other comorbidities.;Nutrition handout(s) given to patient.    Expected Outcomes Short Term Goal: Understand basic principles of dietary content, such as calories, fat, sodium, cholesterol and nutrients.;Long Term Goal: Adherence to prescribed nutrition plan.             Nutrition Assessments:  Nutrition Assessments - 12/22/21 1046       Rate Your Plate Scores   Post Score 69            MEDIFICTS Score Key: ?70 Need to make dietary changes  40-70 Heart Healthy Diet ? 40 Therapeutic Level Cholesterol Diet   Flowsheet Row INTENSIVE CARDIAC REHAB from 12/22/2021 in [place]  Picture Your Plate Total Score on Discharge 69      Picture Your Plate Scores: <81 Unhealthy dietary pattern with much room for  improvement. 41-50 Dietary pattern unlikely to meet recommendations for good health and room for improvement. 51-60 More healthful dietary pattern, with some room for improvement.  >60 Healthy dietary pattern, although there may be some specific behaviors that could be improved.    Nutrition Goals Re-Evaluation:  Nutrition Goals Re-Evaluation     Row Name 10/27/21 1149 11/21/21 1120 12/22/21 1602         Goals   Current Weight 197 lb 12 oz (89.7 kg) 200 lb 6.4 oz (90.9 kg) 203 lb 0.7 oz (92.1 kg)     Comment GFR 56, Cr 1.30, RBC 3.26, hemoglobin 10.2, HCT 32.8, A1c WNL, Lipoprotein A 229, lipid panel WNL. no new labs, most recent labs show GFR 56, Cr 1.30, RBC 3.26, hemoglobin 10.2, HCT 32.8, A1c WNL, Lipoprotein A 229, lipid panel WNL. No new labs at this time.     Expected Outcome Goals in progress. Patient reports making many dietary changes including reduced sodium intake and increased high fiber foods. Patient is down ~24# since prior to his surgery (09/24/2021); his goal weight is 185#. He has good support from his wife and adult children. Goals in progress. Jayzon continues to attend the [employer] and nutrition series. He continues to focus on weight loss with goal of ~185# though his weight is up ~4# since starting with our program. He has eliminated/reduced many carbohydrates as a strategy for weight loss. He does have room for improvement with increasing fruit, vegetable, and whole grain intake to aid with reducing cardiovascular risk and creating consistent calorie deficit. Encouraged using the plate method as a guide for meal planning to make 1/2 the plate vegetables at meals or 5 vegetables servings daily. He continues to read food labels for sugar and sodium. Goals in progress. Keiyon continues to attend the [employer] and nutrition series. He continues to focus on weight loss with goal of ~185#. He has eliminated/reduced many carbohydrates and increased lean protein as a  strategy for weight loss. He continues to read foods labels for sodium and sugar. His family remains extremly supportive of making lifestyle changes. He is up 6.6# since starting with our program. PYP score shows improvement in eating behaviors/food choices.  Nutrition Goals Re-Evaluation:  Nutrition Goals Re-Evaluation     Row Name 10/27/21 1149 11/21/21 1120 12/22/21 1602         Goals   Current Weight 197 lb 12 oz (89.7 kg) 200 lb 6.4 oz (90.9 kg) 203 lb 0.7 oz (92.1 kg)     Comment GFR 56, Cr 1.30, RBC 3.26, hemoglobin 10.2, HCT 32.8, A1c WNL, Lipoprotein A 229, lipid panel WNL. no new labs, most recent labs show GFR 56, Cr 1.30, RBC 3.26, hemoglobin 10.2, HCT 32.8, A1c WNL, Lipoprotein A 229, lipid panel WNL. No new labs at this time.     Expected Outcome Goals in progress. Patient reports making many dietary changes including reduced sodium intake and increased high fiber foods. Patient is down ~24# since prior to his surgery (09/24/2021); his goal weight is 185#. He has good support from his wife and adult children. Goals in progress. Trysten continues to attend the [employer] and nutrition series. He continues to focus on weight loss with goal of ~185# though his weight is up ~4# since starting with our program. He has eliminated/reduced many carbohydrates as a strategy for weight loss. He does have room for improvement with increasing fruit, vegetable, and whole grain intake to aid with reducing cardiovascular risk and creating consistent calorie deficit. Encouraged using the plate method as a guide for meal planning to make 1/2 the plate vegetables at meals or 5 vegetables servings daily. He continues to read food labels for sugar and sodium. Goals in progress. Daimen continues to attend the [employer] and nutrition series. He continues to focus on weight loss with goal of ~185#. He has eliminated/reduced many carbohydrates and increased lean protein as a strategy for  weight loss. He continues to read foods labels for sodium and sugar. His family remains extremly supportive of making lifestyle changes. He is up 6.6# since starting with our program. PYP score shows improvement in eating behaviors/food choices.              Nutrition Goals Discharge (Final Nutrition Goals Re-Evaluation):  Nutrition Goals Re-Evaluation - 12/22/21 1602       Goals   Current Weight 203 lb 0.7 oz (92.1 kg)    Comment No new labs at this time.    Expected Outcome Goals in progress. Shawnmichael continues to attend the [employer] and nutrition series. He continues to focus on weight loss with goal of ~185#. He has eliminated/reduced many carbohydrates and increased lean protein as a strategy for weight loss. He continues to read foods labels for sodium and sugar. His family remains extremly supportive of making lifestyle changes. He is up 6.6# since starting with our program. PYP score shows improvement in eating behaviors/food choices.             Psychosocial: Target Goals: Acknowledge presence or absence of significant depression and/or stress, maximize coping skills, provide positive support system. Participant is able to verbalize types and ability to use techniques and skills needed for reducing stress and depression.  Initial Review & Psychosocial Screening:  Initial Psych Review & Screening - 10/23/21 1029       Initial Review   Current issues with None Identified      Family Dynamics   Good Support System? Yes   B[place]asar has his wife, two children and grandchildren who live in the area for support     Barriers   Psychosocial barriers to participate in program There are no identifiable barriers or psychosocial needs.  Screening Interventions   Interventions Encouraged to exercise             Quality of Life Scores:  Quality of Life - 12/22/21 1628       Quality of Life   Select Quality of Life      Quality of Life Scores   Health/Function  Pre 28 %    Health/Function Post 27.5 %    Health/Function % Change -1.79 %    Socioeconomic Pre 27.6 %    Socioeconomic Post 29.14 %    Socioeconomic % Change  5.58 %    Psych/Spiritual Pre 30 %    Psych/Spiritual Post 30 %    Psych/Spiritual % Change 0 %    Family Pre 30 %    Family Post 30 %    Family % Change 0 %    GLOBAL Pre 28.76 %    GLOBAL Post 28.8 %    GLOBAL % Change 0.14 %            Scores of 19 and below usually indicate a poorer quality of life in these areas.  A difference of  2-3 points is a clinically meaningful difference.  A difference of 2-3 points in the total score of the Quality of Life Index has been associated with significant improvement in overall quality of life, self-image, physical symptoms, and general health in studies assessing change in quality of life.  PHQ-9: Review Flowsheet  More data exists      11/04/2021 10/23/2021 05/19/2021 05/27/2020 10/17/2019  Depression screen PHQ 2/9  Decreased Interest 0 0 0 0 0  Down, Depressed, Hopeless 0 0 0 0 0  PHQ - 2 Score 0 0 0 0 0   Interpretation of Total Score  Total Score Depression Severity:  1-4 = Minimal depression, 5-9 = Mild depression, 10-14 = Moderate depression, 15-19 = Moderately severe depression, 20-27 = Severe depression   Psychosocial Evaluation and Intervention:   Psychosocial Re-Evaluation:  Psychosocial Re-Evaluation     [place] Name 10/28/21 7989 11/04/21 1102 11/25/21 1508 12/23/21 1721       Psychosocial Re-Evaluation   Current issues with None Identified None Identified Current Stress Concerns Current Stress Concerns    Comments -- -- Rion says he concerned that he may have bladder cancer and he had a TURP yesterday Pantelis has not voiced any increased stressors or concerns sice his recent bladder surgery    Interventions Encouraged to attend Cardiac Rehabilitation for the exercise Encouraged to attend Cardiac Rehabilitation for the exercise Stress management education;Encouraged  to attend Cardiac Rehabilitation for the exercise;Relaxation education Stress management education;Encouraged to attend Cardiac Rehabilitation for the exercise;Relaxation education    Continue Psychosocial Services  No Follow up required No Follow up required Follow up required by staff Follow up required by staff      Initial Review   Source of Stress Concerns -- -- Chronic Illness Chronic Illness    Comments -- -- Will continue to monitor and offer support as needed. Will continue to monitor and offer support as needed.             Psychosocial Discharge (Final Psychosocial Re-Evaluation):  Psychosocial Re-Evaluation - 12/23/21 1721       Psychosocial Re-Evaluation   Current issues with Current Stress Concerns    Comments Chayne has not voiced any increased stressors or concerns sice his recent bladder surgery    Interventions Stress management education;Encouraged to attend Cardiac Rehabilitation for the exercise;Relaxation education  Continue Psychosocial Services  Follow up required by staff      Initial Review   Source of Stress Concerns Chronic Illness    Comments Will continue to monitor and offer support as needed.             Vocational Rehabilitation: Provide vocational rehab assistance to qualifying candidates.   Vocational Rehab Evaluation & Intervention:  Vocational Rehab - 10/23/21 1033       Initial Vocational Rehab Evaluation & Intervention   Assessment shows need for Vocational Rehabilitation No   Jasani ir retired and does not need vocational rehab at this time            Education: Education Goals: Education classes will be provided on a weekly basis, covering required topics. Participant will state understanding/return demonstration of topics presented.    Education     Row Name 10/27/21 1400     Education   Cardiac Education Topics Pritikin   Lexicographer Nutrition   Nutrition  Calorie Density   Instruction Review Code 1- Verbalizes Understanding   Class Start Time 1140   Class Stop Time 1230   Class Time Calculation (min) 50 min    [place] Name 10/29/21 1200     Education   Cardiac Education Topics Pritikin   Financial trader   Weekly Topic [employer] Desserts   Instruction Review Code 1- Verbalizes Understanding   Class Start Time 1130   Class Stop Time 1205   Class Time Calculation (min) 35 min    [place] Name 11/03/21 1300     Education   Cardiac Education Topics [place]   Environmental consultant Exercise   Exercise Workshop Exercise Basics: Press photographer   Instruction Review Code 1- Verbalizes Understanding   Class Start Time 1140   Class Stop Time 1224   Class Time Calculation (min) 44 min    [place] Name 11/05/21 1400     Education   Cardiac Education Topics Pritikin   Financial trader   Weekly Topic Efficiency Cooking - Meals in a Snap   Instruction Review Code 1- Verbalizes Understanding   Class Start Time 1135   Class Stop Time 1210   Class Time Calculation (min) 35 min    [place] Name 11/07/21 1200     Education   Cardiac Education Topics Pritikin   Academic librarian Exercise Education   Exercise Education Move It!   Instruction Review Code 1- Verbalizes Understanding   Class Start Time 5621   Class Stop Time 3086   Class Time Calculation (min) 36 min    [place] Name 11/12/21 1300     Education   Cardiac Education Topics Pritikin   Financial trader   Weekly Topic Simple Sides and Sauces   Instruction Review Code 1- Verbalizes Understanding   Class Start Time 1138   Class Stop Time 1210   Class Time Calculation (min) 32 min    [place] Name 11/14/21 1300     Education    Cardiac Education Topics Pritikin   Administrator, sports     Core  Videos   Educator Nurse   Select General Education   General Education Hypertension and Heart Disease   Instruction Review Code 1- Verbalizes Understanding   Class Start Time 1145   Class Stop Time 1227   Class Time Calculation (min) 42 min    [place] Name 11/19/21 1300     Education   Cardiac Education Topics Pritikin   Financial trader   Weekly Topic One-Pot Wonders   Instruction Review Code 1- Verbalizes Understanding   Class Start Time 1142   Class Stop Time 1213   Class Time Calculation (min) 31 min    [place] Name 11/21/21 1200     Education   Cardiac Education Topics Pritikin   Environmental consultant Psychosocial   Psychosocial Workshop New Thoughts, New Behaviors   Instruction Review Code 1- Verbalizes Understanding   Class Start Time 1143   Class Stop Time 1225   Class Time Calculation (min) 42 min    [place] Name 12/10/21 1300     Education   Cardiac Education Topics Pritikin   Financial trader   Weekly Topic Fast and Healthy Breakfasts   Instruction Review Code 1- Verbalizes Understanding   Class Start Time 1138   Class Stop Time 1225   Class Time Calculation (min) 47 min    [place] Name 12/19/21 1500     Education   Cardiac Education Topics Pritikin   Charity fundraiser Exercise Physiologist   Select Psychosocial   Psychosocial Healthy Minds, Bodies, Hearts   Instruction Review Code 1- Verbalizes Understanding   Class Start Time 1200   Class Stop Time 1236   Class Time Calculation (min) 36 min    [place] Name 12/22/21 1200     Education   Cardiac Education Topics Pritikin   IT sales professional Nutrition   Nutrition Workshop Label Reading   Instruction Review Code 1- Sales promotion account executive   Class Start Time 1140   Class Stop Time 1215   Class Time Calculation (min) 35 min            Core Videos: Exercise    Move It!  Clinical staff conducted group or individual video education with verbal and written material and guidebook.  Patient learns the recommended Pritikin exercise program. Exercise with the goal of living a long, healthy life. Some of the health benefits of exercise include controlled diabetes, healthier blood pressure levels, improved cholesterol levels, improved heart and lung capacity, improved sleep, and better body composition. Everyone should speak with their doctor before starting or changing an exercise routine.  Biomechanical Limitations Clinical staff conducted group or individual video education with verbal and written material and guidebook.  Patient learns how biomechanical limitations can impact exercise and how we can mitigate and possibly overcome limitations to have an impactful and balanced exercise routine.  Body Composition Clinical staff conducted group or individual video education with verbal and written material and guidebook.  Patient learns that body composition (ratio of muscle mass to fat mass) is a key component to assessing overall fitness, rather than body weight alone. Increased fat mass, especially visceral belly fat, can put [place] at increased risk for metabolic syndrome, type 2 diabetes, heart disease,  and even death. It is recommended to combine diet and exercise (cardiovascular and resistance training) to improve your body composition. Seek guidance from your physician and exercise physiologist before implementing an exercise routine.  Exercise Action Plan Clinical staff conducted group or individual video education with verbal and written material and guidebook.  Patient learns the recommended strategies to achieve and enjoy long-term exercise adherence, including variety, self-motivation, self-efficacy, and positive  decision making. Benefits of exercise include fitness, good health, weight management, more energy, better sleep, less stress, and overall well-being.  Medical   Heart Disease Risk Reduction Clinical staff conducted group or individual video education with verbal and written material and guidebook.  Patient learns our heart is our most vital organ as it circulates oxygen, nutrients, white blood cells, and hormones throughout the entire body, and carries waste away. Data supports a plant-based eating plan like the Pritikin Program for its effectiveness in slowing progression of and reversing heart disease. The video provides a number of recommendations to address heart disease.   Metabolic Syndrome and Belly Fat  Clinical staff conducted group or individual video education with verbal and written material and guidebook.  Patient learns what metabolic syndrome is, how it leads to heart disease, and how one can reverse it and keep it from coming back. You have metabolic syndrome if you have 3 of the following 5 criteria: abdominal obesity, high blood pressure, high triglycerides, low HDL cholesterol, and high blood sugar.  Hypertension and Heart Disease Clinical staff conducted group or individual video education with verbal and written material and guidebook.  Patient learns that high blood pressure, or hypertension, is very common in the [place]. Hypertension is largely due to excessive salt intake, but other important risk factors include being overweight, physical inactivity, drinking too much alcohol, smoking, and not eating enough potassium from fruits and vegetables. High blood pressure is a leading risk factor for heart attack, stroke, congestive heart failure, dementia, kidney failure, and premature death. Long-term effects of excessive salt intake include stiffening of the arteries and thickening of heart muscle and organ damage. Recommendations include ways to reduce hypertension and the  risk of heart disease.  Diseases of Our Time - Focusing on Diabetes Clinical staff conducted group or individual video education with verbal and written material and guidebook.  Patient learns why the best way to stop diseases of our time is prevention, through food and other lifestyle changes. Medicine (such as prescription pills and surgeries) is often only a Band-Aid on the problem, not a long-term solution. Most common diseases of our time include obesity, type 2 diabetes, hypertension, heart disease, and cancer. The Pritikin Program is recommended and has been proven to help reduce, reverse, and/or prevent the damaging effects of metabolic syndrome.  Nutrition   Overview of the Pritikin Eating Plan  Clinical staff conducted group or individual video education with verbal and written material and guidebook.  Patient learns about the [place] for disease risk reduction. The [place] emphasizes a wide variety of unrefined, minimally-processed carbohydrates, like fruits, vegetables, whole grains, and legumes. Go, Caution, and Stop food choices are explained. Plant-based and lean animal proteins are emphasized. Rationale provided for low sodium intake for blood pressure control, low added sugars for blood sugar stabilization, and low added fats and oils for coronary artery disease risk reduction and weight management.  Calorie Density  Clinical staff conducted group or individual video education with verbal and written material and guidebook.  Patient learns about calorie  density and how it impacts the Pritikin Eating Plan. Knowing the characteristics of the food you choose will help you decide whether those foods will lead to weight gain or weight loss, and whether you want to consume more or less of them. Weight loss is usually a side effect of the Pritikin Eating Plan because of its focus on low calorie-dense foods.  Label Reading  Clinical staff conducted group or  individual video education with verbal and written material and guidebook.  Patient learns about the Pritikin recommended label reading guidelines and corresponding recommendations regarding calorie density, added sugars, sodium content, and whole grains.  Dining Out - Part 1  Clinical staff conducted group or individual video education with verbal and written material and guidebook.  Patient learns that restaurant meals can be sabotaging because they can be so high in calories, fat, sodium, and/or sugar. Patient learns recommended strategies on how to positively address this and avoid unhealthy pitfalls.  Facts on Fats  Clinical staff conducted group or individual video education with verbal and written material and guidebook.  Patient learns that lifestyle modifications can be just as effective, if not more so, as many medications for lowering your risk of heart disease. A Pritikin lifestyle can help to reduce your risk of inflammation and atherosclerosis (cholesterol build-up, or plaque, in the artery walls). Lifestyle interventions such as dietary choices and physical activity address the cause of atherosclerosis. A review of the types of fats and their impact on blood cholesterol levels, along with dietary recommendations to reduce fat intake is also included.  Nutrition Action Plan  Clinical staff conducted group or individual video education with verbal and written material and guidebook.  Patient learns how to incorporate Pritikin recommendations into their lifestyle. Recommendations include planning and keeping personal health goals in mind as an important part of their success.  Healthy Mind-Set    Healthy Minds, Bodies, Hearts  Clinical staff conducted group or individual video education with verbal and written material and guidebook.  Patient learns how to identify when they are stressed. Video will discuss the impact of that stress, as well as the many benefits of stress management.  Patient will also be introduced to stress management techniques. The way we think, act, and feel has an impact on our hearts.  How Our Thoughts Can Heal Our Hearts  Clinical staff conducted group or individual video education with verbal and written material and guidebook.  Patient learns that negative thoughts can cause depression and anxiety. This can result in negative lifestyle behavior and serious health problems. Cognitive behavioral therapy is an effective method to help control our thoughts in order to change and improve our emotional outlook.  Additional Videos:  Exercise    Improving Performance  Clinical staff conducted group or individual video education with verbal and written material and guidebook.  Patient learns to use a non-linear approach by alternating intensity levels and lengths of time spent exercising to help burn more calories and lose more body fat. Cardiovascular exercise helps improve heart health, metabolism, hormonal balance, blood sugar control, and recovery from fatigue. Resistance training improves strength, endurance, balance, coordination, reaction time, metabolism, and muscle mass. Flexibility exercise improves circulation, posture, and balance. Seek guidance from your physician and exercise physiologist before implementing an exercise routine and learn your capabilities and proper form for all exercise.  Introduction to Yoga  Clinical staff conducted group or individual video education with verbal and written material and guidebook.  Patient learns about yoga, a discipline of  the coming together of mind, breath, and body. The benefits of yoga include improved flexibility, improved range of motion, better posture and core strength, increased lung function, weight loss, and positive self-image. Yoga's heart health benefits include lowered blood pressure, healthier heart rate, decreased cholesterol and triglyceride levels, improved immune function, and reduced stress.  Seek guidance from your physician and exercise physiologist before implementing an exercise routine and learn your capabilities and proper form for all exercise.  Medical   Aging: Enhancing Your Quality of Life  Clinical staff conducted group or individual video education with verbal and written material and guidebook.  Patient learns key strategies and recommendations to stay in good physical health and enhance quality of life, such as prevention strategies, having an advocate, securing a [place], and keeping a list of medications and system for tracking them. It also discusses how to avoid risk for bone loss.  Biology of Weight Control  Clinical staff conducted group or individual video education with verbal and written material and guidebook.  Patient learns that weight gain occurs because we consume more calories than we burn (eating more, moving less). Even if your body weight is normal, you may have higher ratios of fat compared to muscle mass. Too much body fat puts you at increased risk for cardiovascular disease, heart attack, stroke, type 2 diabetes, and obesity-related cancers. In addition to exercise, following the [place] can help reduce your risk.  Decoding Lab Results  Clinical staff conducted group or individual video education with verbal and written material and guidebook.  Patient learns that lab test reflects one measurement whose values change over time and are influenced by many factors, including medication, stress, sleep, exercise, food, hydration, pre-existing medical conditions, and more. It is recommended to use the knowledge from this video to become more involved with your lab results and evaluate your numbers to speak with your doctor.   Diseases of Our Time - Overview  Clinical staff conducted group or individual video education with verbal and written material and guidebook.  Patient learns that according to the CDC, 50%  to 70% of chronic diseases (such as obesity, type 2 diabetes, elevated lipids, hypertension, and heart disease) are avoidable through lifestyle improvements including healthier food choices, listening to satiety cues, and increased physical activity.  Sleep Disorders Clinical staff conducted group or individual video education with verbal and written material and guidebook.  Patient learns how good quality and duration of sleep are important to overall health and well-being. Patient also learns about sleep disorders and how they impact health along with recommendations to address them, including discussing with a physician.  Nutrition  Dining Out - Part 2 Clinical staff conducted group or individual video education with verbal and written material and guidebook.  Patient learns how to plan ahead and communicate in order to maximize their dining experience in a healthy and nutritious manner. Included are recommended food choices based on the type of restaurant the patient is visiting.   Fueling a Best boy conducted group or individual video education with verbal and written material and guidebook.  There is a strong connection between our food choices and our health. Diseases like obesity and type 2 diabetes are very prevalent and are in large-part due to lifestyle choices. The Pritikin Eating Plan provides plenty of food and hunger-curbing satisfaction. It is easy to follow, affordable, and helps reduce health risks.  Menu Workshop  Clinical staff conducted group or  individual video education with verbal and written material and guidebook.  Patient learns that restaurant meals can sabotage health goals because they are often packed with calories, fat, sodium, and sugar. Recommendations include strategies to plan ahead and to communicate with the manager, chef, or server to help order a healthier meal.  Planning Your Eating Strategy  Clinical staff conducted group or individual  video education with verbal and written material and guidebook.  Patient learns about the [place] and its benefit of reducing the risk of disease. The [place] does not focus on calories. Instead, it emphasizes high-quality, nutrient-rich foods. By knowing the characteristics of the foods, we choose, we can determine their calorie density and make informed decisions.  Targeting Your Nutrition Priorities  Clinical staff conducted group or individual video education with verbal and written material and guidebook.  Patient learns that lifestyle habits have a tremendous impact on disease risk and progression. This video provides eating and physical activity recommendations based on your personal health goals, such as reducing LDL cholesterol, losing weight, preventing or controlling type 2 diabetes, and reducing high blood pressure.  Vitamins and Minerals  Clinical staff conducted group or individual video education with verbal and written material and guidebook.  Patient learns different ways to obtain key vitamins and minerals, including through a recommended healthy diet. It is important to discuss all supplements you take with your doctor.   Healthy Mind-Set    Smoking Cessation  Clinical staff conducted group or individual video education with verbal and written material and guidebook.  Patient learns that cigarette smoking and tobacco addiction pose a serious health risk which affects millions of people. Stopping smoking will significantly reduce the risk of heart disease, lung disease, and many forms of cancer. Recommended strategies for quitting are covered, including working with your doctor to develop a successful plan.  Culinary   Becoming a Financial trader conducted group or individual video education with verbal and written material and guidebook.  Patient learns that cooking at home can be healthy, cost-effective, quick, and puts them in control.  Keys to cooking healthy recipes will include looking at your recipe, assessing your equipment needs, planning ahead, making it simple, choosing cost-effective seasonal ingredients, and limiting the use of added fats, salts, and sugars.  Cooking - Breakfast and Snacks  Clinical staff conducted group or individual video education with verbal and written material and guidebook.  Patient learns how important breakfast is to satiety and nutrition through the entire day. Recommendations include key foods to eat during breakfast to help stabilize blood sugar levels and to prevent overeating at meals later in the day. Planning ahead is also a key component.  Cooking - Human resources officer conducted group or individual video education with verbal and written material and guidebook.  Patient learns eating strategies to improve overall health, including an approach to cook more at home. Recommendations include thinking of animal protein as a side on your plate rather than center stage and focusing instead on lower calorie dense options like vegetables, fruits, whole grains, and plant-based proteins, such as beans. Making sauces in large quantities to freeze for later and leaving the skin on your vegetables are also recommended to maximize your experience.  Cooking - Healthy Salads and Dressing Clinical staff conducted group or individual video education with verbal and written material and guidebook.  Patient learns that vegetables, fruits, whole grains, and legumes are the foundations of the [place].  Recommendations include how to incorporate each of these in flavorful and healthy salads, and how to create homemade salad dressings. Proper handling of ingredients is also covered. Cooking - Soups and [employer] - Soups and Desserts Clinical staff conducted group or individual video education with verbal and written material and guidebook.  Patient learns that Pritikin soups and  desserts make for easy, nutritious, and delicious snacks and meal components that are low in sodium, fat, sugar, and calorie density, while high in vitamins, minerals, and filling fiber. Recommendations include simple and healthy ideas for soups and desserts.   Overview     The Pritikin Solution Program Overview Clinical staff conducted group or individual video education with verbal and written material and guidebook.  Patient learns that the results of the [place] Program have been documented in more than 100 articles published in peer-reviewed journals, and the benefits include reducing risk factors for (and, in some cases, even reversing) high cholesterol, high blood pressure, type 2 diabetes, obesity, and more! An overview of the three key pillars of the Pritikin Program will be covered: eating well, doing regular exercise, and having a healthy mind-set.  WORKSHOPS  Exercise: Exercise Basics: Building Your Action Plan Clinical staff led group instruction and group discussion with PowerPoint presentation and patient guidebook. To enhance the learning environment the use of posters, models and videos may be added. At the conclusion of this workshop, patients will comprehend the difference between physical activity and exercise, as well as the benefits of incorporating both, into their routine. Patients will understand the FITT (Frequency, Intensity, Time, and Type) principle and how to use it to build an exercise action plan. In addition, safety concerns and other considerations for exercise and cardiac rehab will be addressed by the presenter. The purpose of this lesson is to promote a comprehensive and effective weekly exercise routine in order to improve patients' overall level of fitness.   Managing Heart Disease: Your Path to a Healthier Heart Clinical staff led group instruction and group discussion with PowerPoint presentation and patient guidebook. To enhance the learning environment  the use of posters, models and videos may be added.At the conclusion of this workshop, patients will understand the anatomy and physiology of the heart. Additionally, they will understand how Pritikin's three pillars impact the risk factors, the progression, and the management of heart disease.  The purpose of this lesson is to provide a high-level overview of the heart, heart disease, and how the Pritikin lifestyle positively impacts risk factors.  Exercise Biomechanics Clinical staff led group instruction and group discussion with PowerPoint presentation and patient guidebook. To enhance the learning environment the use of posters, models and videos may be added. Patients will learn how the structural parts of their bodies function and how these functions impact their daily activities, movement, and exercise. Patients will learn how to promote a neutral spine, learn how to manage pain, and identify ways to improve their physical movement in order to promote healthy living. The purpose of this lesson is to expose patients to common physical limitations that impact physical activity. Participants will learn practical ways to adapt and manage aches and pains, and to minimize their effect on regular exercise. Patients will learn how to maintain good posture while sitting, walking, and lifting.  Balance Training and Fall Prevention  Clinical staff led group instruction and group discussion with PowerPoint presentation and patient guidebook. To enhance the learning environment the use of posters, models and videos may be added. At the  conclusion of this workshop, patients will understand the importance of their sensorimotor skills (vision, proprioception, and the vestibular system) in maintaining their ability to balance as they age. Patients will apply a variety of balancing exercises that are appropriate for their current level of function. Patients will understand the common causes for  poor balance, possible solutions to these problems, and ways to modify their physical environment in order to minimize their fall risk. The purpose of this lesson is to teach patients about the importance of maintaining balance as they age and ways to minimize their risk of falling.  WORKSHOPS   Nutrition:  Fueling a Scientist, research (physical sciences) led group instruction and group discussion with PowerPoint presentation and patient guidebook. To enhance the learning environment the use of posters, models and videos may be added. Patients will review the foundational principles of the [place] and understand what constitutes a serving size in each of the food groups. Patients will also learn Pritikin-friendly foods that are better choices when away from home and review make-ahead meal and snack options. Calorie density will be reviewed and applied to three nutrition priorities: weight maintenance, weight loss, and weight gain. The purpose of this lesson is to reinforce (in a group setting) the key concepts around what patients are recommended to eat and how to apply these guidelines when away from home by planning and selecting Pritikin-friendly options. Patients will understand how calorie density may be adjusted for different weight management goals.  Mindful Eating  Clinical staff led group instruction and group discussion with PowerPoint presentation and patient guidebook. To enhance the learning environment the use of posters, models and videos may be added. Patients will briefly review the concepts of the [place] and the importance of low-calorie dense foods. The concept of mindful eating will be introduced as well as the importance of paying attention to internal hunger signals. Triggers for non-hunger eating and techniques for dealing with triggers will be explored. The purpose of this lesson is to provide patients with the opportunity to review the basic principles of the  [place], discuss the value of eating mindfully and how to measure internal cues of hunger and fullness using the Hunger Scale. Patients will also discuss reasons for non-hunger eating and learn strategies to use for controlling emotional eating.  Targeting Your Nutrition Priorities Clinical staff led group instruction and group discussion with PowerPoint presentation and patient guidebook. To enhance the learning environment the use of posters, models and videos may be added. Patients will learn how to determine their genetic susceptibility to disease by reviewing their family history. Patients will gain insight into the importance of diet as part of an overall healthy lifestyle in mitigating the impact of genetics and other environmental insults. The purpose of this lesson is to provide patients with the opportunity to assess their personal nutrition priorities by looking at their family history, their own health history and current risk factors. Patients will also be able to discuss ways of prioritizing and modifying the [place] for their highest risk areas  Menu  Clinical staff led group instruction and group discussion with PowerPoint presentation and patient guidebook. To enhance the learning environment the use of posters, models and videos may be added. Using menus brought in from [employer], or printed from [employer], patients will apply the [place] dining out guidelines that were presented in the [employer] video. Patients will also be able to practice these guidelines in a  variety of provided scenarios. The purpose of this lesson is to provide patients with the opportunity to practice hands-on learning of the [place] with actual menus and practice scenarios.  Label Reading Clinical staff led group instruction and group discussion with PowerPoint presentation and patient guidebook. To enhance the learning environment  the use of posters, models and videos may be added. Patients will review and discuss the Pritikin label reading guidelines presented in Pritikin's Label Reading Educational series video. Using fool labels brought in from local grocery stores and markets, patients will apply the label reading guidelines and determine if the packaged food meet the Pritikin guidelines. The purpose of this lesson is to provide patients with the opportunity to review, discuss, and practice hands-on learning of the Pritikin Label Reading guidelines with actual packaged food labels. [place] Workshops are designed to teach patients ways to prepare quick, simple, and affordable recipes at home. The importance of nutrition's role in chronic disease risk reduction is reflected in its emphasis in the overall Pritikin program. By learning how to prepare essential core Pritikin Eating Plan recipes, patients will increase control over what they eat; be able to customize the flavor of foods without the use of added salt, sugar, or fat; and improve the quality of the food they consume. By learning a set of core recipes which are easily assembled, quickly prepared, and affordable, patients are more likely to prepare more healthy foods at home. These workshops focus on convenient breakfasts, simple entres, side dishes, and desserts which can be prepared with minimal effort and are consistent with nutrition recommendations for cardiovascular risk reduction. Cooking [employer] are taught by a Engineer, materials (RD) who has been trained by the [employer]. The chef or RD has a clear understanding of the importance of minimizing - if not completely eliminating - added fat, sugar, and sodium in recipes. Throughout the series of [place] Workshop sessions, patients will learn about healthy ingredients and efficient methods of cooking to build confidence in their capability to  prepare    Cooking School weekly topics:  Adding Flavor- Sodium-Free  Fast and Healthy Breakfasts  Powerhouse Plant-Based Proteins  Satisfying Salads and Dressings  Simple Sides and Sauces  International Cuisine-Spotlight on the [place] Zones  Delicious Desserts  Savory Soups  Efficiency Cooking - Meals in a Snap  Tasty Appetizers and Snacks  Comforting Weekend Breakfasts  One-Pot Wonders   Fast Evening Meals  Easy [place] (Psychosocial): New Thoughts, New Behaviors Clinical staff led group instruction and group discussion with PowerPoint presentation and patient guidebook. To enhance the learning environment the use of posters, models and videos may be added. Patients will learn and practice techniques for developing effective health and lifestyle goals. Patients will be able to effectively apply the goal setting process learned to develop at least one new personal goal.  The purpose of this lesson is to expose patients to a new skill set of behavior modification techniques such as techniques setting SMART goals, overcoming barriers, and achieving new thoughts and new behaviors.  Managing Moods and Relationships Clinical staff led group instruction and group discussion with PowerPoint presentation and patient guidebook. To enhance the learning environment the use of posters, models and videos may be added. Patients will learn how emotional and chronic stress factors can impact their health and relationships. They will learn healthy ways to manage their moods  and utilize positive coping mechanisms. In addition, ICR patients will learn ways to improve communication skills. The purpose of this lesson is to expose patients to ways of understanding how one's mood and health are intimately connected. Developing a healthy outlook can help build positive relationships and connections with others. Patients will understand the  importance of utilizing effective communication skills that include actively listening and being heard. They will learn and understand the importance of the "4 Cs" and especially Connections in fostering of a Healthy Mind-Set.  Healthy Sleep for a Healthy Heart Clinical staff led group instruction and group discussion with PowerPoint presentation and patient guidebook. To enhance the learning environment the use of posters, models and videos may be added. At the conclusion of this workshop, patients will be able to demonstrate knowledge of the importance of sleep to overall health, well-being, and quality of life. They will understand the symptoms of, and treatments for, common sleep disorders. Patients will also be able to identify daytime and nighttime behaviors which impact sleep, and they will be able to apply these tools to help manage sleep-related challenges. The purpose of this lesson is to provide patients with a general overview of sleep and outline the importance of quality sleep. Patients will learn about a few of the most common sleep disorders. Patients will also be introduced to the concept of "sleep hygiene," and discover ways to self-manage certain sleeping problems through simple daily behavior changes. Finally, the workshop will motivate patients by clarifying the links between quality sleep and their goals of heart-healthy living.   Recognizing and Reducing Stress Clinical staff led group instruction and group discussion with PowerPoint presentation and patient guidebook. To enhance the learning environment the use of posters, models and videos may be added. At the conclusion of this workshop, patients will be able to understand the types of stress reactions, differentiate between acute and chronic stress, and recognize the impact that chronic stress has on their health. They will also be able to apply different coping mechanisms, such as reframing negative self-talk. Patients will have the  opportunity to practice a variety of stress management techniques, such as deep abdominal breathing, progressive muscle relaxation, and/or guided imagery.  The purpose of this lesson is to educate patients on the role of stress in their lives and to provide healthy techniques for coping with it.  Learning Barriers/Preferences:  Learning Barriers/Preferences - 10/23/21 1126       Learning Barriers/Preferences   Learning Barriers Exercise Concerns   Balance concerns   Learning Preferences Written Material;Pictoral             Education Topics:  Knowledge Questionnaire Score:  Knowledge Questionnaire Score - 12/22/21 1629       Knowledge Questionnaire Score   Pre Score 19/24    Post Score 19/24             Core Components/Risk Factors/Patient Goals at Admission:  Personal Goals and Risk Factors at Admission - 10/23/21 1129       Core Components/Risk Factors/Patient Goals on Admission    Weight Management Yes;Weight Maintenance    Intervention Weight Management: Develop a combined nutrition and exercise program designed to reach desired caloric intake, while maintaining appropriate intake of nutrient and fiber, sodium and fats, and appropriate energy expenditure required for the weight goal.;Weight Management: Provide education and appropriate resources to help participant work on and attain dietary goals.;Weight Management/Obesity: Establish reasonable short term and long term weight goals.    Admit Weight 196 lb  6.9 oz (89.1 kg)    Expected Outcomes Short Term: Continue to assess and modify interventions until short term weight is achieved;Long Term: Adherence to nutrition and physical activity/exercise program aimed toward attainment of established weight goal;Weight Maintenance: Understanding of the daily nutrition guidelines, which includes 25-35% calories from fat, 7% or less cal from saturated fats, less than '200mg'$  cholesterol, less than 1.5gm of sodium, & 5 or more  servings of fruits and vegetables daily;Understanding recommendations for meals to include 15-35% energy as protein, 25-35% energy from fat, 35-60% energy from carbohydrates, less than '200mg'$  of dietary cholesterol, 20-35 gm of total fiber daily;Understanding of distribution of calorie intake throughout the day with the consumption of 4-5 meals/snacks    Heart Failure Yes    Intervention Provide a combined exercise and nutrition program that is supplemented with education, support and counseling about heart failure. Directed toward relieving symptoms such as shortness of breath, decreased exercise tolerance, and extremity edema.    Expected Outcomes Improve functional capacity of life;Short term: Attendance in program 2-3 days a week with increased exercise capacity. Reported lower sodium intake. Reported increased fruit and vegetable intake. Reports medication compliance.;Short term: Daily weights obtained and reported for increase. Utilizing diuretic protocols set by physician.;Long term: Adoption of self-care skills and reduction of barriers for early signs and symptoms recognition and intervention leading to self-care maintenance.    Hypertension Yes    Intervention Provide education on lifestyle modifcations including regular physical activity/exercise, weight management, moderate sodium restriction and increased consumption of fresh fruit, vegetables, and low fat dairy, alcohol moderation, and smoking cessation.;Monitor prescription use compliance.    Expected Outcomes Short Term: Continued assessment and intervention until BP is < 140/61m HG in hypertensive participants. < 130/82mHG in hypertensive participants with diabetes, heart failure or chronic kidney disease.;Long Term: Maintenance of blood pressure at goal levels.    Lipids Yes    Intervention Provide education and support for participant on nutrition & aerobic/resistive exercise along with prescribed medications to achieve LDL '70mg'$ , HDL  >'40mg'$ .    Expected Outcomes Short Term: Participant states understanding of desired cholesterol values and is compliant with medications prescribed. Participant is following exercise prescription and nutrition guidelines.;Long Term: Cholesterol controlled with medications as prescribed, with individualized exercise RX and with personalized nutrition plan. Value goals: LDL < '70mg'$ , HDL > 40 mg.    Stress Yes    Intervention Offer individual and/or small group education and counseling on adjustment to heart disease, stress management and health-related lifestyle change. Teach and support self-help strategies.;Refer participants experiencing significant psychosocial distress to appropriate mental health specialists for further evaluation and treatment. When possible, include family members and significant others in education/counseling sessions.    Expected Outcomes Short Term: Participant demonstrates changes in health-related behavior, relaxation and other stress management skills, ability to obtain effective social support, and compliance with psychotropic medications if prescribed.;Long Term: Emotional wellbeing is indicated by absence of clinically significant psychosocial distress or social isolation.    Personal Goal Other Yes    Personal Goal Short term: Flexibility and consult with RD Long term: maintain wt loss and stamina    Intervention Will continue to monitor pt and progress workloads as tolerated without sign or symptom    Expected Outcomes Pt will achieve his goals and gain knowledge and stamina             Core Components/Risk Factors/Patient Goals Review:   Goals and Risk Factor Review     Row Name 10/28/21 0824 11/04/21  1102 11/25/21 1510 12/23/21 1724       Core Components/Risk Factors/Patient Goals Review   Personal Goals Review Weight Management/Obesity;Heart Failure;Stress;Hypertension;Lipids Weight Management/Obesity;Heart Failure;Stress;Hypertension;Lipids Weight  Management/Obesity;Heart Failure;Stress;Hypertension;Lipids Weight Management/Obesity;Heart Failure;Stress;Hypertension;Lipids    Review Bert started intensive caridac rehab on 10/27/21 and did well with exercise. Vital signs were stable. Trentyn started intensive cardiac rehab on 10/27/21. Noted that patient was recently started on Entresto will continue to monitor BP. Esvin is off to a good start to exercise. Romain has been doing well with exercise. Vital signs have been stable. Rami has increased his workloads Elex has been doing well with exercise. Vital signs have been stable. Ollis has gained 3 kg since starting cardiac rehab. Blythe was absent from cardiac rehab due to bladder surgery and has returned to exercise.    Expected Outcomes Leeandre will contibue to participate in intensive cardiac rehab for exercise, nutrition and lifestyle modifications Tillman will contibue to participate in intensive cardiac rehab for exercise, nutrition and lifestyle modifications Janis will contibue to participate in intensive cardiac rehab for exercise, nutrition and lifestyle modifications Arth will contibue to participate in intensive cardiac rehab for exercise, nutrition and lifestyle modifications             Core Components/Risk Factors/Patient Goals at Discharge (Final Review):   Goals and Risk Factor Review - 12/23/21 1724       Core Components/Risk Factors/Patient Goals Review   Personal Goals Review Weight Management/Obesity;Heart Failure;Stress;Hypertension;Lipids    Review Balthazar has been doing well with exercise. Vital signs have been stable. Haddon has gained 3 kg since starting cardiac rehab. Chares was absent from cardiac rehab due to bladder surgery and has returned to exercise.    Expected Outcomes Chasin will contibue to participate in intensive cardiac rehab for exercise, nutrition and lifestyle modifications             ITP Comments:  ITP Comments     Row Name 10/23/21 1018 10/28/21  0821 11/04/21 1101 11/25/21 1507 12/23/21 1720   ITP Comments Dr Fransico Him MD, Medical Director, Introduction to Pritikin Education Program/ Intensive Cardiac Rehab. Initial Orientation Packet Reviewed with the Patient 30 Day ITP Review. Andon started intensive cardiac rehab on 10/28/21 and did well with exercise 30 Day ITP Review. Brailen is off to a good start to exercise at  intensive cardiac rehab 30 Day ITP Review. Dashan has good attendance and participation in intensive cardiac rehab. 30 Day ITP Review. Rein has good attendance and participation in intensive cardiac rehab since his return to exercise post TURP            Comments: See ITP comments.Harrell Gave RN BSN

## 2021-12-24 ENCOUNTER — Encounter (HOSPITAL_COMMUNITY)
Admission: RE | Admit: 2021-12-24 | Discharge: 2021-12-24 | Disposition: A | Discharge status: Home / Self Care | Payer: Medicare Other | Source: Ambulatory Visit | Attending: Internal Medicine | Admitting: Internal Medicine

## 2021-12-24 DIAGNOSIS — Z952 Presence of prosthetic heart valve: Secondary | ICD-10-CM | POA: Diagnosis not present

## 2021-12-24 DIAGNOSIS — I493 Ventricular premature depolarization: Secondary | ICD-10-CM | POA: Diagnosis not present

## 2021-12-24 DIAGNOSIS — Z79899 Other long term (current) drug therapy: Secondary | ICD-10-CM | POA: Diagnosis not present

## 2021-12-24 DIAGNOSIS — Z951 Presence of aortocoronary bypass graft: Secondary | ICD-10-CM | POA: Diagnosis not present

## 2021-12-24 DIAGNOSIS — E785 Hyperlipidemia, unspecified: Secondary | ICD-10-CM | POA: Diagnosis not present

## 2021-12-24 DIAGNOSIS — I2510 Atherosclerotic heart disease of native coronary artery without angina pectoris: Secondary | ICD-10-CM | POA: Diagnosis not present

## 2021-12-24 DIAGNOSIS — I10 Essential (primary) hypertension: Secondary | ICD-10-CM | POA: Diagnosis not present

## 2021-12-26 ENCOUNTER — Encounter (HOSPITAL_COMMUNITY): Payer: Medicare Other

## 2021-12-29 ENCOUNTER — Encounter: Payer: Self-pay | Admitting: Internal Medicine

## 2021-12-29 ENCOUNTER — Encounter (HOSPITAL_COMMUNITY)
Admission: RE | Admit: 2021-12-29 | Discharge: 2021-12-29 | Disposition: A | Discharge status: Home / Self Care | Payer: Medicare Other | Source: Ambulatory Visit | Attending: Internal Medicine | Admitting: Internal Medicine

## 2021-12-29 DIAGNOSIS — I5042 Chronic combined systolic (congestive) and diastolic (congestive) heart failure: Secondary | ICD-10-CM

## 2021-12-29 DIAGNOSIS — I2510 Atherosclerotic heart disease of native coronary artery without angina pectoris: Secondary | ICD-10-CM | POA: Diagnosis not present

## 2021-12-29 DIAGNOSIS — I10 Essential (primary) hypertension: Secondary | ICD-10-CM | POA: Diagnosis not present

## 2021-12-29 DIAGNOSIS — Z952 Presence of prosthetic heart valve: Secondary | ICD-10-CM | POA: Diagnosis not present

## 2021-12-29 DIAGNOSIS — Z951 Presence of aortocoronary bypass graft: Secondary | ICD-10-CM | POA: Diagnosis not present

## 2021-12-29 DIAGNOSIS — Z79899 Other long term (current) drug therapy: Secondary | ICD-10-CM | POA: Diagnosis not present

## 2021-12-29 DIAGNOSIS — I493 Ventricular premature depolarization: Secondary | ICD-10-CM | POA: Diagnosis not present

## 2021-12-29 DIAGNOSIS — E785 Hyperlipidemia, unspecified: Secondary | ICD-10-CM | POA: Diagnosis not present

## 2021-12-29 DIAGNOSIS — I350 Nonrheumatic aortic (valve) stenosis: Secondary | ICD-10-CM

## 2021-12-30 ENCOUNTER — Telehealth: Payer: Self-pay | Admitting: Family Medicine

## 2021-12-30 ENCOUNTER — Encounter: Payer: Self-pay | Admitting: Internal Medicine

## 2021-12-30 DIAGNOSIS — I10 Essential (primary) hypertension: Secondary | ICD-10-CM

## 2021-12-30 NOTE — Telephone Encounter (Signed)
Left a message for the pt to call back.  

## 2021-12-30 NOTE — Telephone Encounter (Signed)
Left message for patient to call back and schedule Medicare Annual Wellness Visit (AWV) either virtually or in office. Left  my Luis Herrera number [phone]    awvs per [facility] 08/17/11 please schedule with Nurse Health Adviser   45 min for awv-i and in office appointments 30 min for awv-s  phone/virtual appointments

## 2021-12-31 ENCOUNTER — Encounter (HOSPITAL_COMMUNITY)
Admission: RE | Admit: 2021-12-31 | Discharge: 2021-12-31 | Disposition: A | Discharge status: Home / Self Care | Payer: Medicare Other | Source: Ambulatory Visit | Attending: Internal Medicine | Admitting: Internal Medicine

## 2021-12-31 ENCOUNTER — Encounter: Payer: Self-pay | Admitting: Internal Medicine

## 2021-12-31 VITALS — Ht 69.0 in | Wt 205.2 lb

## 2021-12-31 DIAGNOSIS — Z951 Presence of aortocoronary bypass graft: Secondary | ICD-10-CM | POA: Diagnosis not present

## 2021-12-31 DIAGNOSIS — I10 Essential (primary) hypertension: Secondary | ICD-10-CM | POA: Diagnosis not present

## 2021-12-31 DIAGNOSIS — I2510 Atherosclerotic heart disease of native coronary artery without angina pectoris: Secondary | ICD-10-CM | POA: Diagnosis not present

## 2021-12-31 DIAGNOSIS — I493 Ventricular premature depolarization: Secondary | ICD-10-CM | POA: Diagnosis not present

## 2021-12-31 DIAGNOSIS — Z952 Presence of prosthetic heart valve: Secondary | ICD-10-CM | POA: Diagnosis not present

## 2021-12-31 DIAGNOSIS — Z79899 Other long term (current) drug therapy: Secondary | ICD-10-CM | POA: Diagnosis not present

## 2021-12-31 DIAGNOSIS — E785 Hyperlipidemia, unspecified: Secondary | ICD-10-CM | POA: Diagnosis not present

## 2021-12-31 NOTE — Telephone Encounter (Signed)
Recomm Valsartan 160 mg daily    Follow BP

## 2021-12-31 NOTE — Telephone Encounter (Signed)
Late post;   I spoke with the pt yesterday and he says that he has reached his donut hole for the rest of 2023 and his [name] is costing him 600.00.   I advised him that I will talk with our team and see what help we can do to help him.

## 2021-12-31 NOTE — Telephone Encounter (Signed)
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 If he was getting 1 month filled and was in the donut hole it should be ~150 dollars. Can you have him check to make sure his pharmacy actually billed insurance.         I left a message for the pt to call back.

## 2021-12-31 NOTE — Telephone Encounter (Signed)
I spoke with the pt and unable to locate [facility] 24/26 samples for him... he was quoted 189.00 per month for his RX through 2023... he is asking if we an change it for now until he get past the donut hole... he says at one time we changed him to Valsartan when this happened before.

## 2022-01-01 ENCOUNTER — Encounter: Payer: Medicare Other | Admitting: Internal Medicine

## 2022-01-01 VITALS — BP 132/60 | HR 57 | Ht 70.0 in | Wt 206.0 lb

## 2022-01-01 DIAGNOSIS — I493 Ventricular premature depolarization: Secondary | ICD-10-CM

## 2022-01-01 DIAGNOSIS — I2510 Atherosclerotic heart disease of native coronary artery without angina pectoris: Secondary | ICD-10-CM

## 2022-01-01 MED ORDER — AMIODARONE HCL 200 MG PO TABS: ORAL_TABLET | ORAL | 3 refills | Status: DC

## 2022-01-01 MED ORDER — VALSARTAN 160 MG PO TABS: 160.0000 mg | ORAL_TABLET | Freq: Every day | ORAL | 3 refills | Status: DC

## 2022-01-01 NOTE — Progress Notes (Signed)
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 He is a pleasant 81 yo man with a h/o CAD, s/p CABG, AS, s/p AVR, HTN who I initially saw for PVC's several years ago. We placed him on low dose amiodarone and his dose has been gradually decreased. 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 His palpitations are well controlled but over time, he has had an increase in his PVC's.     Current Outpatient Medications  Medication Sig Dispense Refill   amLODipine (NORVASC) 5 MG tablet Take 1 tablet (5 mg total) by mouth daily. 90 tablet 3   amoxicillin-clavulanate (AUGMENTIN) 875-125 MG tablet Take 1 tablet by mouth 2 (two) times daily.     aspirin EC 81 MG tablet Take 1 tablet (81 mg total) by mouth daily. Swallow whole. 90 tablet 3   atorvastatin (LIPITOR) 80 MG tablet TAKE 1 TABLET BY MOUTH ONCE  DAILY 90 tablet 3   carvedilol (COREG) 6.25 MG tablet TAKE 1 TABLET BY MOUTH TWICE  DAILY 180 tablet 2   ezetimibe (ZETIA) 10 MG tablet TAKE 1 TABLET BY MOUTH DAILY 90 tablet 3   furosemide (LASIX) 20 MG tablet Take 1 tablet (20 mg total) by mouth daily.     levothyroxine (SYNTHROID) 50 MCG tablet TAKE 1 TABLET BY MOUTH DAILY  BEFORE BREAKFAST 90 tablet 3   Multiple Vitamins-Minerals (PRESERVISION AREDS 2) CAPS Take 1 capsule by mouth 2 (two) times daily.     nitrofurantoin, macrocrystal-monohydrate, (MACROBID) 100 MG capsule Take 100 mg by mouth at bedtime.     tamsulosin (FLOMAX) 0.4 MG CAPS capsule Take 0.4 mg by mouth daily.     valsartan (DIOVAN) 160 MG tablet Take 1 tablet (160 mg total) by mouth daily. 90 tablet 3   amiodarone (PACERONE) 200 MG tablet Take half tablet ( 100 mg ) by mouth daily; Monday through Friday. Then: Take 1 tablet ( 200 mg ) by mouth a day, on Saturday and Sunday ONLY. 45 tablet 3   No current facility-administered medications for this visit.     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 TAli Loweand Dr. BCyndia Bent  Thyroid disease     ROS:   All systems reviewed and negative except as noted in the HPI.   Past Surgical History:  Procedure Laterality Date   AORTIC VALVE REPLACEMENT  2009   [place]   CHOLECYSTECTOMY  1982   COLONOSCOPY     CORONARY ARTERY BYPASS GRAFT  2009   SVG-PDA w/ AVR   CORRECTION HAMMER TOE  2011   BILATERAL   GASTROCNEMIUS RECESSION  05/28/2011   INTRAOPERATIVE TRANSTHORACIC ECHOCARDIOGRAM N/A 09/30/2021   Procedure: INTRAOPERATIVE TRANSTHORACIC ECHOCARDIOGRAM;  Surgeon: TEarly Osmond MD;  [facility];  Service: Open Heart Surgery;  Laterality: N/A;   POLYPECTOMY  RIGHT/LEFT HEART CATH AND CORONARY/GRAFT ANGIOGRAPHY N/A 09/24/2021   Procedure: RIGHT/LEFT HEART CATH AND CORONARY/GRAFT ANGIOGRAPHY;  Surgeon: Early Osmond, MD;  Location: [place] [place] [facility];  Service: Cardiovascular;  Laterality: N/A;   TONSILLECTOMY     TOTAL HIP ARTHROPLASTY Left 2000   TOTAL KNEE ARTHROPLASTY  2000   left   TOTAL KNEE ARTHROPLASTY  12/01/2011   Procedure: TOTAL KNEE ARTHROPLASTY;  Surgeon: Mauri Pole, MD;  Location: [facility];  Service: Orthopedics;  Laterality: Right;   TRANSCATHETER AORTIC VALVE REPLACEMENT, TRANSFEMORAL Right 09/30/2021   Procedure: Transcatheter Aortic Valve Replacement, Transfemoral;  Surgeon: Early Osmond, MD;  Location: [place] [facility];  Service: Open Heart Surgery;  Laterality: Right;   TRANSURETHRAL RESECTION OF BLADDER TUMOR  Bilateral 11/24/2021   Procedure: TRANSURETHRAL RESECTION OF BLADDER TUMOR (TURBT) BILATERAL RETROGRADE PYELOGRAM;  Surgeon: Lucas Mallow, MD;  Location: [facility];  Service: Urology;  Laterality: Bilateral;  58 MINS FOR CASE   WEIL OSTEOTOMY  05/28/2011     Family History  Problem Relation Age of Onset   Colon cancer Mother 62   Heart attack Father    Hypertension Other    Hyperlipidemia Other    Heart attack Brother    Hypertension Sister    Hypertension Brother    Stroke Neg Hx    Esophageal cancer Neg Hx    Rectal cancer Neg Hx    Stomach cancer Neg Hx      Social History   Socioeconomic History   Marital status: Married    Spouse name: Not on file   Number of children: 2   Years of education: 12   Highest education level: High school graduate  Occupational History   Occupation: retired  Tobacco Use   Smoking status: Former    Types: Cigarettes    Quit date: 05/25/1973    Years since quitting: 48.6   Smokeless tobacco: Never  Vaping Use   Vaping Use: Never used  Substance and Sexual Activity   Alcohol use: Not Currently    Comment: rare   Drug use: No   Sexual activity: Not Currently  Other Topics Concern   Not on file  Social History Narrative   Regular exercise-yes. Pt lives in [place] with his wife.   Social Determinants of Health   Financial Resource Strain: Not on file  Food Insecurity: No Food Insecurity (11/04/2021)   Hunger Vital Sign    Worried About Running Out of Food in the Last Year: Never true    Ran Out of Food in the Last Year: Never true  Transportation Needs: No Transportation Needs (11/04/2021)   PRAPARE - Hydrologist (Medical): No    Lack of Transportation (Non-Medical): No  Physical Activity: Not on file  Stress: Not on file  Social Connections: Not on file  Intimate Partner Violence: Not At Risk (11/04/2021)   Humiliation, Afraid, Rape, and Kick questionnaire    Fear of Current or  Ex-Partner: No    Emotionally Abused: No    Physically Abused: No    Sexually Abused: No     BP 132/60   Pulse (!) 57   Ht '5\' 10"'$  (1.778 m)   Wt 206 lb (93.4 kg)   SpO2 98%   BMI 29.56 kg/m   Physical Exam:  Well appearing NAD HEENT: Unremarkable Neck:  No JVD, no thyromegally Lymphatics:  No adenopathy Back:  No CVA tenderness Lungs:  Clear HEART:  Regular rate rhythm, no murmurs, no rubs, no clicks Abd:  soft, positive bowel sounds, no organomegally, no rebound, no guarding Ext:  2 plus pulses, no edema, no cyanosis, no clubbing Skin:  No rashes no nodules Neuro:  CN II through XII intact, motor grossly intact  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  See PaceArt for details.   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 I asked him to increase his dose of amiodarone to 100 mg M-F and 200 mg on Sat/Sun.  CAD - he denies anginal symptoms.  Obesity - his weight is improved though he needs to lose more. Dyslipidemia - he will continue zetia and lipitor.   Luis Overlie Christianna Belmonte,MD

## 2022-01-01 NOTE — Patient Instructions (Addendum)
Medication Instructions:  Your physician has recommended you make the following change in your medication: Amiodarone increase:  Take half tablet ( 100 mg ) by mouth daily; Monday through FridayT. Then: Take 1 tablet ( 200 mg ) by mouth a day, on Saturday and Sunday ONLY.    REMEMBER to take your Blood Pressure Medications regularly.  Dr. Lovena Le recommends increasing daily EXERCISE, and to take your morning medications with breakfast.      Lab Work: None ordered.  If you have labs (blood work) drawn today and your tests are completely normal, you will receive your results only by: [place] (if you have MyChart) OR A paper copy in the mail If you have any lab test that is abnormal or we need to change your treatment, we will call you to review the results.  Testing/Procedures: None ordered.  Follow-Up: At [facility], you and your health needs are our priority.  As part of our continuing mission to provide you with exceptional heart care, we have created designated Provider Care Teams.  These Care Teams include your primary Cardiologist (physician) and Advanced Practice Providers (APPs -  Physician Assistants and Nurse Practitioners) who all work together to provide you with the care you need, when you need it.  We recommend signing up for the patient portal called "MyChart".  Sign up information is provided on this After Visit Summary.  MyChart is used to connect with patients for Virtual Visits (Telemedicine).  Patients are able to view lab/test results, encounter notes, upcoming appointments, etc.  Non-urgent messages can be sent to your provider as well.   To learn more about what you can do with MyChart, go to [email or web address].    Your next appointment:   6 Month follow up  The format for your next appointment:   In Person  Provider:   Cristopher Peru, MD{or one of the following Advanced Practice Providers on your designated Care Team:   Tommye Standard, [place] Legrand Como  "Jonni Sanger" Chalmers Cater, [place]    Important Information About Sugar

## 2022-01-02 ENCOUNTER — Encounter (HOSPITAL_COMMUNITY)
Admission: RE | Admit: 2022-01-02 | Discharge: 2022-01-02 | Disposition: A | Discharge status: Home / Self Care | Payer: Medicare Other | Source: Ambulatory Visit | Attending: Internal Medicine | Admitting: Internal Medicine

## 2022-01-02 DIAGNOSIS — Z952 Presence of prosthetic heart valve: Secondary | ICD-10-CM

## 2022-01-02 DIAGNOSIS — I2510 Atherosclerotic heart disease of native coronary artery without angina pectoris: Secondary | ICD-10-CM | POA: Diagnosis not present

## 2022-01-02 DIAGNOSIS — I493 Ventricular premature depolarization: Secondary | ICD-10-CM | POA: Diagnosis not present

## 2022-01-02 DIAGNOSIS — I10 Essential (primary) hypertension: Secondary | ICD-10-CM | POA: Diagnosis not present

## 2022-01-02 DIAGNOSIS — Z79899 Other long term (current) drug therapy: Secondary | ICD-10-CM | POA: Diagnosis not present

## 2022-01-02 DIAGNOSIS — E785 Hyperlipidemia, unspecified: Secondary | ICD-10-CM | POA: Diagnosis not present

## 2022-01-02 DIAGNOSIS — Z951 Presence of aortocoronary bypass graft: Secondary | ICD-10-CM | POA: Diagnosis not present

## 2022-01-02 NOTE — Telephone Encounter (Signed)
Pateint had low BP at cardiac rehab   Hold valsartan    Keep on other meds   Set up for appt in HTN clinic  in 1 months

## 2022-01-02 NOTE — Progress Notes (Signed)
Post exercise BP 74/32 heart rate 59. Patient asymptomatic given a total of 3 8 ounce bottles of water. Recheck BP 103/57 sitting. Standing BP 90/54. Dr Harrington Challenger called and notified. Instructed Luis Herrera to hold his valsartan until further notice. Patient also instructed to take his blood pressures daily and to record his readings. Dr Harrington Challenger said that she will schedule Luis Herrera to follow up in the office in one month. [name] left cardiac rehab without symptoms or complaints.Harrell Gave RN BSN

## 2022-01-02 NOTE — Telephone Encounter (Signed)
Pt has not been dizzy     BP 74 to 134/ Stopped valsartan  Recomm he follow BP off if meds    Going to rehab 2 more times    Pt would like to do PREP program.  Send referral [facility]

## 2022-01-05 ENCOUNTER — Encounter (HOSPITAL_COMMUNITY)
Admission: RE | Admit: 2022-01-05 | Discharge: 2022-01-05 | Disposition: A | Discharge status: Home / Self Care | Payer: Medicare Other | Source: Ambulatory Visit | Attending: Internal Medicine | Admitting: Internal Medicine

## 2022-01-05 ENCOUNTER — Encounter: Payer: Self-pay | Admitting: Internal Medicine

## 2022-01-05 DIAGNOSIS — Z952 Presence of prosthetic heart valve: Secondary | ICD-10-CM

## 2022-01-05 DIAGNOSIS — I493 Ventricular premature depolarization: Secondary | ICD-10-CM | POA: Diagnosis not present

## 2022-01-05 DIAGNOSIS — E785 Hyperlipidemia, unspecified: Secondary | ICD-10-CM | POA: Diagnosis not present

## 2022-01-05 DIAGNOSIS — Z951 Presence of aortocoronary bypass graft: Secondary | ICD-10-CM | POA: Diagnosis not present

## 2022-01-05 DIAGNOSIS — Z79899 Other long term (current) drug therapy: Secondary | ICD-10-CM | POA: Diagnosis not present

## 2022-01-05 DIAGNOSIS — I2510 Atherosclerotic heart disease of native coronary artery without angina pectoris: Secondary | ICD-10-CM | POA: Diagnosis not present

## 2022-01-05 DIAGNOSIS — I10 Essential (primary) hypertension: Secondary | ICD-10-CM | POA: Diagnosis not present

## 2022-01-05 NOTE — Progress Notes (Signed)
Systolic BP's in the 258'F to 150's today. Patient asymptomatic. Luis Herrera says his blood pressures are running higher at home also. Canio says he sent a message to Dr Harrington Challenger via my chart. Will forward today's vital signs via media for Dr Harrington Challenger to review.Harrell Gave RN BSN

## 2022-01-06 NOTE — Telephone Encounter (Signed)
Left a message for the pt and advised him that I will call him back in a few hours to follow up on his BP.

## 2022-01-06 NOTE — Telephone Encounter (Signed)
-----   Message from Magda Kiel, RN sent at 01/05/2022 11:59 AM EST ----- Dr Harrington Challenger, Mr Drudge is worried that his blood pressures are running high since you held his valsartan. I asked him to bring his BP cuff on Wednesday. He was asymptomatic. Exit BP WNL.  Thank you, [place]

## 2022-01-06 NOTE — Addendum Note (Signed)
Addended by: Stephani Police on: 01/06/2022 11:22 AM   Modules accepted: Orders

## 2022-01-06 NOTE — Telephone Encounter (Signed)
Spoke with the pt and referral placed for the Pharm D.   See also other encounters re: the pts BP.   Pt is going to keep monitoring and keep a log to bring to his HTN clinic appt.

## 2022-01-06 NOTE — Telephone Encounter (Signed)
I spoke with patient. He reports he talked with Dr Harrington Challenger on Friday and has stopped Valsartan.  Reports BP was running low at cardiac rehab.  He has one session of cardiac rehab left. Patient aware of  plan for PREP program and [facility] referral.  Patient reports the following BP readings over the last 3 days- 119/60's 140/84 160/80. He will continue to check at home and send readings through my chart.  Referrals placed.

## 2022-01-06 NOTE — Addendum Note (Signed)
Addended by: Thompson Grayer on: 01/06/2022 09:35 AM   Modules accepted: Orders

## 2022-01-06 NOTE — Telephone Encounter (Signed)
See other encounter... referral placed for the HTN clinic.Marland Kitchen pt aware.

## 2022-01-07 ENCOUNTER — Telehealth: Payer: Self-pay

## 2022-01-07 ENCOUNTER — Telehealth: Payer: Self-pay | Admitting: *Deleted

## 2022-01-07 ENCOUNTER — Encounter (HOSPITAL_COMMUNITY)
Admission: RE | Admit: 2022-01-07 | Discharge: 2022-01-07 | Disposition: A | Discharge status: Home / Self Care | Payer: Medicare Other | Source: Ambulatory Visit | Attending: Internal Medicine | Admitting: Internal Medicine

## 2022-01-07 VITALS — BP 114/62 | HR 60 | Ht 69.0 in | Wt 205.2 lb

## 2022-01-07 DIAGNOSIS — I2510 Atherosclerotic heart disease of native coronary artery without angina pectoris: Secondary | ICD-10-CM | POA: Diagnosis not present

## 2022-01-07 DIAGNOSIS — I10 Essential (primary) hypertension: Secondary | ICD-10-CM | POA: Diagnosis not present

## 2022-01-07 DIAGNOSIS — Z79899 Other long term (current) drug therapy: Secondary | ICD-10-CM | POA: Diagnosis not present

## 2022-01-07 DIAGNOSIS — I493 Ventricular premature depolarization: Secondary | ICD-10-CM | POA: Diagnosis not present

## 2022-01-07 DIAGNOSIS — Z952 Presence of prosthetic heart valve: Secondary | ICD-10-CM

## 2022-01-07 DIAGNOSIS — Z951 Presence of aortocoronary bypass graft: Secondary | ICD-10-CM | POA: Diagnosis not present

## 2022-01-07 DIAGNOSIS — E785 Hyperlipidemia, unspecified: Secondary | ICD-10-CM | POA: Diagnosis not present

## 2022-01-07 NOTE — Progress Notes (Signed)
Discharge Progress Report  Patient Details  Name: Luis Herrera MRN: [id number] Date of Birth: [birth year] Referring Provider:   Flowsheet Row INTENSIVE CARDIAC REHAB ORIENT from 10/23/2021 in [facility]  Referring Provider Dr. Lenna Sciara, MD        Number of Visits: 26  Reason for Discharge:  Patient reached a stable level of exercise. Patient independent in their exercise. Patient has met program and personal goals.  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 Lenna Sciara, MD    Expected Discharge Date 12/19/21      NuStep   Level 1    SPM 75    Minutes 25    METs 1.5      Prescription Details   Frequency (times per week) 3    Duration Progress to 30 minutes of continuous aerobic without signs/symptoms of physical distress      Intensity   THRR 40-80% of Max Heartrate 56-134    Ratings of Perceived Exertion 11-13    Perceived Dyspnea 0-4      Progression   Progression Continue progressive overload as per policy without signs/symptoms or physical distress.      Resistance Training   Training Prescription Yes    Weight 3    Reps 10-15             Discharge Exercise Prescription (Final Exercise Prescription Changes):  Exercise Prescription Changes - 01/07/22 1024       Response to Exercise   Blood Pressure (Admit) 114/62    Blood Pressure (Exercise) 132/74    Blood Pressure (Exit) 128/70    Heart Rate (Admit) 60 bpm    Heart Rate (Exercise) 86 bpm    Heart Rate (Exit) 58 bpm    Rating of Perceived Exertion (Exercise) 11    Symptoms  None    Comments Patient completed the cardiac rehab program.    Duration Progress to 30 minutes of  aerobic without signs/symptoms of physical distress    Intensity THRR unchanged      Progression   Progression Continue to progress workloads to maintain intensity without signs/symptoms of physical distress.    Average METs 2.74      Resistance Training   Training Prescription No   Relaxation day, no weights.     Interval Training   Interval Training No      Track   Laps 25    Minutes 30    METs 2.74      Home Exercise Plan   Plans to continue exercise at [facility] (comment)   [facility]   Frequency Add 2 additional days to program exercise sessions.    Initial Home Exercises Provided 11/07/21             Functional Capacity:  6 Minute Walk     Row Name 10/23/21 1106 12/31/21 1622       6 Minute Walk   Phase Initial Discharge    Distance 1098 feet 1128 feet  Distance % Change -- 2.73 %    Distance Feet Change -- 30 ft    Walk Time 6 minutes 6 minutes    # of Rest Breaks 0 0    MPH 2.08 2.14    METS 1.73 1.67    RPE 10 11    Perceived Dyspnea  0 0    VO2 Peak 6.06 5.87    Symptoms No No    Resting HR 61 bpm 63 bpm    Resting BP 118/50 112/48    Resting Oxygen Saturation  98 % 100 %    Exercise Oxygen Saturation  during 6 min walk 97 % 99 %    Max Ex. HR 80 bpm 81 bpm    Max Ex. BP 128/52 122/60    2 Minute Post BP 148/62 98/70             Psychological, QOL, Others - Outcomes: PHQ 2/9:    01/07/2022   11:15 AM 11/04/2021    2:07 PM 10/23/2021   10:35 AM 05/19/2021    9:41 AM 05/27/2020   11:06 AM  Depression screen PHQ 2/9  Decreased Interest 0 0 0 0 0  Down, Depressed, Hopeless 0 0 0 0 0  PHQ - 2 Score 0 0 0 0 0    Quality of Life:  Quality of Life - 12/22/21 1628       Quality of Life   Select Quality of Life      Quality of Life Scores   Health/Function Pre 28 %    Health/Function Post 27.5 %     Health/Function % Change -1.79 %    Socioeconomic Pre 27.6 %    Socioeconomic Post 29.14 %    Socioeconomic % Change  5.58 %    Psych/Spiritual Pre 30 %    Psych/Spiritual Post 30 %    Psych/Spiritual % Change 0 %    Family Pre 30 %    Family Post 30 %    Family % Change 0 %    GLOBAL Pre 28.76 %    GLOBAL Post 28.8 %    GLOBAL % Change 0.14 %             Personal Goals: Goals established at orientation with interventions provided to work toward goal.  Personal Goals and Risk Factors at Admission - 10/23/21 1129       Core Components/Risk Factors/Patient Goals on Admission    Weight Management Yes;Weight Maintenance    Intervention Weight Management: Develop a combined nutrition and exercise program designed to reach desired caloric intake, while maintaining appropriate intake of nutrient and fiber, sodium and fats, and appropriate energy expenditure required for the weight goal.;Weight Management: Provide education and appropriate resources to help participant work on and attain dietary goals.;Weight Management/Obesity: Establish reasonable short term and long term weight goals.    Admit Weight 196 lb 6.9 oz (89.1 kg)    Expected Outcomes Short Term: Continue to assess and modify interventions until short term weight is achieved;Long Term: Adherence to nutrition and physical activity/exercise program aimed toward attainment of established weight goal;Weight Maintenance: Understanding of the daily nutrition guidelines, which includes 25-35% calories from fat, 7% or less cal from saturated fats, less than 281m cholesterol, less than 1.5gm of sodium, & 5 or more servings of fruits and vegetables daily;Understanding recommendations for meals to include 15-35% energy as protein, 25-35% energy from fat, 35-60% energy from carbohydrates, less than 20103mof dietary cholesterol, 20-35 gm of total  fiber daily;Understanding of distribution of calorie intake throughout the day with the consumption  of 4-5 meals/snacks    Heart Failure Yes    Intervention Provide a combined exercise and nutrition program that is supplemented with education, support and counseling about heart failure. Directed toward relieving symptoms such as shortness of breath, decreased exercise tolerance, and extremity edema.    Expected Outcomes Improve functional capacity of life;Short term: Attendance in program 2-3 days a week with increased exercise capacity. Reported lower sodium intake. Reported increased fruit and vegetable intake. Reports medication compliance.;Short term: Daily weights obtained and reported for increase. Utilizing diuretic protocols set by physician.;Long term: Adoption of self-care skills and reduction of barriers for early signs and symptoms recognition and intervention leading to self-care maintenance.    Hypertension Yes    Intervention Provide education on lifestyle modifcations including regular physical activity/exercise, weight management, moderate sodium restriction and increased consumption of fresh fruit, vegetables, and low fat dairy, alcohol moderation, and smoking cessation.;Monitor prescription use compliance.    Expected Outcomes Short Term: Continued assessment and intervention until BP is < 140/70m HG in hypertensive participants. < 130/832mHG in hypertensive participants with diabetes, heart failure or chronic kidney disease.;Long Term: Maintenance of blood pressure at goal levels.    Lipids Yes    Intervention Provide education and support for participant on nutrition & aerobic/resistive exercise along with prescribed medications to achieve LDL <7046mHDL >21m89m  Expected Outcomes Short Term: Participant states understanding of desired cholesterol values and is compliant with medications prescribed. Participant is following exercise prescription and nutrition guidelines.;Long Term: Cholesterol controlled with medications as prescribed, with individualized exercise RX and with  personalized nutrition plan. Value goals: LDL < 70mg69mL > 40 mg.    Stress Yes    Intervention Offer individual and/or small group education and counseling on adjustment to heart disease, stress management and health-related lifestyle change. Teach and support self-help strategies.;Refer participants experiencing significant psychosocial distress to appropriate mental health specialists for further evaluation and treatment. When possible, include family members and significant others in education/counseling sessions.    Expected Outcomes Short Term: Participant demonstrates changes in health-related behavior, relaxation and other stress management skills, ability to obtain effective social support, and compliance with psychotropic medications if prescribed.;Long Term: Emotional wellbeing is indicated by absence of clinically significant psychosocial distress or social isolation.    Personal Goal Other Yes    Personal Goal Short term: Flexibility and consult with RD Long term: maintain wt loss and stamina    Intervention Will continue to monitor pt and progress workloads as tolerated without sign or symptom    Expected Outcomes Pt will achieve his goals and gain knowledge and stamina              Personal Goals Discharge:  Goals and Risk Factor Review     Row Name 10/28/21 0824 11/04/21 1102 11/25/21 1510 12/23/21 1724       Core Components/Risk Factors/Patient Goals Review   Personal Goals Review Weight Management/Obesity;Heart Failure;Stress;Hypertension;Lipids Weight Management/Obesity;Heart Failure;Stress;Hypertension;Lipids Weight Management/Obesity;Heart Failure;Stress;Hypertension;Lipids Weight Management/Obesity;Heart Failure;Stress;Hypertension;Lipids    Review Tenzin started intensive caridac rehab on 10/27/21 and did well with exercise. Vital signs were stable. Ronit started intensive cardiac rehab on 10/27/21. Noted that patient was recently started on Entresto will continue to  monitor BP. DenisAshlinff to a good start to exercise. DenisTahjaebeen doing well with exercise. Vital signs have been stable. DenisCailanincreased his workloads DenisGedeonbeen doing well with  exercise. Vital signs have been stable. Makaio has gained 3 kg since starting cardiac rehab. Kaileb was absent from cardiac rehab due to bladder surgery and has returned to exercise.    Expected Outcomes Jacobie will contibue to participate in intensive cardiac rehab for exercise, nutrition and lifestyle modifications Gatlin will contibue to participate in intensive cardiac rehab for exercise, nutrition and lifestyle modifications Jaivian will contibue to participate in intensive cardiac rehab for exercise, nutrition and lifestyle modifications Stancil will contibue to participate in intensive cardiac rehab for exercise, nutrition and lifestyle modifications             Exercise Goals and Review:  Exercise Goals     Row Name 10/23/21 1119             Exercise Goals   Increase Physical Activity Yes       Intervention Provide advice, education, support and counseling about physical activity/exercise needs.;Develop an individualized exercise prescription for aerobic and resistive training based on initial evaluation findings, risk stratification, comorbidities and participant's personal goals.       Expected Outcomes Short Term: Attend rehab on a regular basis to increase amount of physical activity.;Long Term: Add in home exercise to make exercise part of routine and to increase amount of physical activity.;Long Term: Exercising regularly at least 3-5 days a week.       Increase Strength and Stamina Yes       Intervention Provide advice, education, support and counseling about physical activity/exercise needs.;Develop an individualized exercise prescription for aerobic and resistive training based on initial evaluation findings, risk stratification, comorbidities and participant's personal goals.       Expected  Outcomes Short Term: Increase workloads from initial exercise prescription for resistance, speed, and METs.;Short Term: Perform resistance training exercises routinely during rehab and add in resistance training at home;Long Term: Improve cardiorespiratory fitness, muscular endurance and strength as measured by increased METs and functional capacity (6MWT)       Able to understand and use rate of perceived exertion (RPE) scale Yes       Intervention Provide education and explanation on how to use RPE scale       Expected Outcomes Short Term: Able to use RPE daily in rehab to express subjective intensity level;Long Term:  Able to use RPE to guide intensity level when exercising independently       Knowledge and understanding of Target Heart Rate Range (THRR) Yes       Intervention Provide education and explanation of THRR including how the numbers were predicted and where they are located for reference       Expected Outcomes Short Term: Able to state/look up THRR;Long Term: Able to use THRR to govern intensity when exercising independently;Short Term: Able to use daily as guideline for intensity in rehab       Understanding of Exercise Prescription Yes       Intervention Provide education, explanation, and written materials on patient's individual exercise prescription       Expected Outcomes Short Term: Able to explain program exercise prescription;Long Term: Able to explain home exercise prescription to exercise independently                Exercise Goals Re-Evaluation:  Exercise Goals Re-Evaluation     Row Name 10/27/21 1153 11/07/21 1102 12/08/21 1147 12/19/21 1107 01/05/22 1022     Exercise Goal Re-Evaluation   Exercise Goals Review Increase Physical Activity;Increase Strength and Stamina;Able to understand and use rate of perceived exertion (  RPE) scale;Knowledge and understanding of Target Heart Rate Range (THRR);Understanding of Exercise Prescription Increase Physical Activity;Increase  Strength and Stamina;Able to understand and use rate of perceived exertion (RPE) scale;Knowledge and understanding of Target Heart Rate Range (THRR);Understanding of Exercise Prescription;Able to check pulse independently Increase Physical Activity;Increase Strength and Stamina;Able to understand and use rate of perceived exertion (RPE) scale;Knowledge and understanding of Target Heart Rate Range (THRR);Understanding of Exercise Prescription;Able to check pulse independently Increase Physical Activity;Increase Strength and Stamina;Able to understand and use rate of perceived exertion (RPE) scale;Knowledge and understanding of Target Heart Rate Range (THRR);Understanding of Exercise Prescription;Able to check pulse independently Increase Physical Activity;Increase Strength and Stamina;Able to understand and use rate of perceived exertion (RPE) scale;Knowledge and understanding of Target Heart Rate Range (THRR);Understanding of Exercise Prescription;Able to check pulse independently   Comments Pt's first day in the [facility]2 program. Pt understands the exercise Rx, THRR and RPE scale. [facility] and using the equipment as his mode of home exercise. Patient currently averages 3000 steps/day. Patient is able to manually check his pulse. Patient returned to exercise after bladder procedure. Tolerated exercise well and states he feels better than before his absence. Patient states he was able to tolerated exercise without taking a rest break, which he usually has to do. Patient is making gradual progress with exercise. Patient is [facility] 15-[facility] 2 [facility] and is scheduled to start the P.R.E.P. program on 01/19/22 after completion of the cardiac rehab program. Patient will complete the cardiac rehab program on Wednesday 01/07/22 and has progressed well. Patient plans to continue walking 15-20 minutes 3 days/week [facility] and will [facility]/22.   Expected Outcomes Will continue to montior patient and progress exercise workloasa as tolerated. Patient will walk[facility]se [facility] 30 minutes at least 2 days/week in addition to exercise at cardiac rehab to achieve 150 minutes of aerobic exercise/week. Progress workloads as tolerated to help increase strength and stamina. Patient will continue walking 2 days/week in addition to exercise at cardiac rehab. Patient will continue exercise 3-6 days/ week to maintain health and fitness gains.            Nutrition & Weight - Outcomes:  Pre Biometrics - 10/23/21 1120       Pre Biometrics   Waist Circumference 39.5 inches    Hip Circumference 40 inches    Waist to Hip Ratio 0.99 %    Triceps Skinfold 16 mm    % Body Fat 28.4 %    Grip Strength 32 kg    Flexibility --   pt unable to reach   Single Leg Stand 4.2 seconds             Post Biometrics - 12/31/21 1624        Post  Biometrics   Height _0  (1.753 m)    Weight 205 lb 4 oz (93.1 kg)    Waist Circumference 41 inches    Hip Circumference 38 inches    Waist to Hip Ratio 1.08 %    BMI (Calculated) 30.3    Triceps Skinfold 15 mm    % Body Fat 29.3 %    Grip Strength 32 kg    Flexibility 9.5 in    Single Leg Stand 4.62 seconds             Nutrition:  Nutrition Therapy & Goals - 12/22/21 7902  Nutrition Therapy   Diet Heart Healthy Diet    Drug/Food Interactions Statins/Certain Fruits      Personal Nutrition Goals   Nutrition Goal Patient to understand strategies for reducing cardiovascular risk by attending the [employer] education and nutrition classes    Personal Goal #2 Patient to build a healthy plate daily with variety of fruits, vegetables, whole grains, lean protein/plant protein and nonfat/lowfat dairy.    Personal Goal #3 Patient to limit to <155m of sodium daily.    Comments Goals in progress. DDaelancontinues to attend the [employer] nutrition series. He continues to focus  on weight loss with goal of ~185#. He has eliminated/reduced many carbohydrates and increased lean protein as a strategy for weight loss. He continues to read foods labels for sodium and sugar. His family remains extremly supportive of making lifestyle changes.      Intervention Plan   Intervention Prescribe, educate and counsel regarding individualized specific dietary modifications aiming towards targeted core components such as weight, hypertension, lipid management, diabetes, heart failure and other comorbidities.;Nutrition handout(s) given to patient.    Expected Outcomes Short Term Goal: Understand basic principles of dietary content, such as calories, fat, sodium, cholesterol and nutrients.;Long Term Goal: Adherence to prescribed nutrition plan.             Nutrition Discharge:  Nutrition Assessments - 12/22/21 1046       Rate Your Plate Scores   Post Score 69             Education Questionnaire Score:  Knowledge Questionnaire Score - 12/22/21 1629       Knowledge Questionnaire Score   Pre Score 19/24    Post Score 19/24             Goals reviewed with patient; copy given to patient.Pt graduates from  Intensive/Traditional cardiac rehab program today 01/07/22  with completion of  26 exercise and education sessions. Pt maintained good attendance and progressed nicely during their participation in rehab as evidenced by increased MET level.   Medication list reconciled. Repeat  PHQ score- 0 .  Pt has made significant lifestyle changes and should be commended for their success.  Elton achieved their goals during cardiac rehab.   Pt plans to continue exercise at the prep program. DYoshiwill start on 01/19/22. Valin says intensive cardiac rehab  has been beneficial for him. Calum increased his distance on his post exercise walk test by 30 feet. We are proud of Giovonni progress! Eddy did gain 4 kg while in the program. Not sure if this is due to his recent bladder surgery.  We  are proud of DSimona Huhprogress! MHarrell GaveRN BSN

## 2022-01-07 NOTE — Progress Notes (Signed)
  Care Coordination  Outreach Note  01/07/2022 Name: Luis Herrera MRN: [id number] DOB: 1940/10/09   Care Coordination Outreach Attempts: An unsuccessful telephone outreach was attempted today to offer the patient information about available care coordination services as a benefit of their health plan.   Follow Up Plan:  Additional outreach attempts will be made to offer the patient care coordination information and services.   Encounter Outcome:  No Answer  [place]  Direct Dial: [phone]

## 2022-01-07 NOTE — Telephone Encounter (Signed)
Call to pt reference PREP referral  Explained program to him He just finished Cardiac Rehab and is interested in participating in [place] Can start in Dec, anytime after 10am Lives closest to [facility] Will call him closer to start of class Given my number for call back as needed

## 2022-01-12 DIAGNOSIS — H353131 Nonexudative age-related macular degeneration, bilateral, early dry stage: Secondary | ICD-10-CM | POA: Diagnosis not present

## 2022-01-12 DIAGNOSIS — L602 Onychogryphosis: Secondary | ICD-10-CM | POA: Diagnosis not present

## 2022-01-12 DIAGNOSIS — L84 Corns and callosities: Secondary | ICD-10-CM | POA: Diagnosis not present

## 2022-01-13 NOTE — Progress Notes (Signed)
  Care Coordination   Note   01/13/2022 Name: Luis Herrera MRN: [id number] DOB: 06-09-1940  Luis Herrera is a 81 y.o. year old male who sees [name], MD for primary care. I reached out to [name] by phone today to offer care coordination services.  Luis Herrera was given information about Care Coordination services today including:   The Care Coordination services include support from the care team which includes your Nurse Coordinator, Clinical Social Worker, or Pharmacist.  The Care Coordination team is here to help remove barriers to the health concerns and goals most important to you. Care Coordination services are voluntary, and the patient may decline or stop services at any time by request to their care team member.   Care Coordination Consent Status: Patient agreed to services and verbal consent obtained.   Follow up plan:  Telephone appointment with care coordination team member scheduled for:  01/15/22  Encounter Outcome:  Pt. Scheduled  [place]  Direct Dial: [phone]

## 2022-01-15 NOTE — Patient Instructions (Addendum)
Visit Information  Thank you for taking time to visit with me today. Please don't hesitate to contact me if I can be of assistance to you.   Following are the goals we discussed today:   Goals Addressed             This Visit's Progress    Blood Pressure Management       Care Coordination Interventions: Discussed importance of seeing primary care provider and encouraged to call to schedule at least an annual Discussed how to obtain most accurate blood pressure readings Advised patient to call and/or communicate with my chart Blood pressure readings Reviewed upcoming/scheduled appointments Provided education re:      Our next appointment is by telephone on 01/21/22 at 3:00 pm  Please call the care guide team at [phone] if you need to cancel or reschedule your appointment.   If you are experiencing a Mental Health or [place] or need someone to talk to, please call the Suicide and Crisis Lifeline: 988  Patient verbalizes understanding of instructions and care plan provided today and agrees to view in [place]. Active MyChart status and patient understanding of how to access instructions and care plan via MyChart confirmed with patient.     Thea Silversmith, RN, MSN, BSN, CCM [facility] Care Coordinator [phone]    How to Take Your Blood Pressure Blood pressure is a measurement of how strongly your blood is pressing against the walls of your arteries. Arteries are blood vessels that carry blood from your heart throughout your body. Your health care provider takes your blood pressure at each office visit. You can also take your own blood pressure at home with a blood pressure monitor. You may need to take your own blood pressure to: Confirm a diagnosis of high blood pressure (hypertension). Monitor your blood pressure over time. Make sure your blood pressure medicine is working. Supplies needed: Blood pressure monitor. A chair to sit in. This should be a chair where  you can sit upright with your back supported. Do not sit on a soft couch or an armchair. Table or desk. Small notebook and pencil or pen. How to prepare To get the most accurate reading, avoid the following for 30 minutes before you check your blood pressure: Drinking caffeine. Drinking alcohol. Eating. Smoking. Exercising. Five minutes before you check your blood pressure: Use the bathroom and urinate so that you have an empty bladder. Sit quietly in a chair. Do not talk. How to take your blood pressure To check your blood pressure, follow the instructions in the manual that came with your blood pressure monitor. If you have a digital blood pressure monitor, the instructions may be as follows: Sit up straight in a chair. Place your feet on the floor. Do not cross your ankles or legs. Rest your left arm at the level of your heart on a table or desk or on the arm of a chair. Pull up your shirt sleeve. Wrap the blood pressure cuff around the upper part of your left arm, 1 inch (2.5 cm) above your elbow. It is best to wrap the cuff around bare skin. Fit the cuff snugly, but not too tightly, around your arm. You should be able to place only one finger between the cuff and your arm. Position the cord so that it rests in the bend of your elbow. Press the power button. Sit quietly while the cuff inflates and deflates. Read the digital reading on the monitor screen and write the  numbers down (record them) in a notebook. Wait 2-3 minutes, then repeat the steps, starting at step 1. What does my blood pressure reading mean? A blood pressure reading consists of a higher number over a lower number. Ideally, your blood pressure should be below 120/80. The first ("top") number is called the systolic pressure. It is a measure of the pressure in your arteries as your heart beats. The second ("bottom") number is called the diastolic pressure. It is a measure of the pressure in your arteries as the heart  relaxes. Blood pressure is classified into four stages. The following are the stages for adults who do not have a short-term serious illness or a chronic condition. Systolic pressure and diastolic pressure are measured in a unit called mm Hg (millimeters of mercury).  Normal Systolic pressure: below 924. Diastolic pressure: below 80. Elevated Systolic pressure: 268-341. Diastolic pressure: below 80. Hypertension stage 1 Systolic pressure: 962-229. Diastolic pressure: 79-89. Hypertension stage 2 Systolic pressure: 211 or above. Diastolic pressure: 90 or above. You can have elevated blood pressure or hypertension even if only the systolic or only the diastolic number in your reading is higher than normal. Follow these instructions at home: Medicines Take over-the-counter and prescription medicines only as told by your health care provider. Tell your health care provider if you are having any side effects from blood pressure medicine. General instructions Check your blood pressure as often as recommended by your health care provider. Check your blood pressure at the same time every day. Take your monitor to the next appointment with your health care provider to make sure that: You are using it correctly. It provides accurate readings. Understand what your goal blood pressure numbers are. Keep all follow-up visits. This is important. General tips Your health care provider can suggest a reliable monitor that will meet your needs. There are several types of home blood pressure monitors. Choose a monitor that has an arm cuff. Do not choose a monitor that measures your blood pressure from your wrist or finger. Choose a cuff that wraps snugly, not too tight or too loose, around your upper arm. You should be able to fit only one finger between your arm and the cuff. You can buy a blood pressure monitor at most drugstores or online. Where to find more information American Heart Association:  www.heart.org Contact a health care provider if: Your blood pressure is consistently high. Your blood pressure is suddenly low. Get help right away if: Your systolic blood pressure is higher than 180. Your diastolic blood pressure is higher than 120. These symptoms may be an emergency. Get help right away. Call 911. Do not wait to see if the symptoms will go away. Do not drive yourself to the hospital. Summary Blood pressure is a measurement of how strongly your blood is pressing against the walls of your arteries. A blood pressure reading consists of a higher number over a lower number. Ideally, your blood pressure should be below 120/80. Check your blood pressure at the same time every day. Avoid caffeine, alcohol, smoking, and exercise for 30 minutes prior to checking your blood pressure. These agents can affect the accuracy of the blood pressure reading. This information is not intended to replace advice given to you by your health care provider. Make sure you discuss any questions you have with your health care provider. Document Revised: 10/17/2020 Document Reviewed: 10/17/2020 Elsevier Patient Education  [place].

## 2022-01-15 NOTE — Patient Outreach (Signed)
  Care Coordination   Initial Visit Note   01/15/2022 Name: Luis Herrera MRN: [id number] DOB: 1941-01-13  Luis Herrera is a 81 y.o. year old male who sees Luis Ruddy, MD for primary care. I spoke with  Luis Herrera by phone today.  What matters to the patients health and wellness today?  Luis Herrera reports he primarily sees cardiologist for health care. Per review of chart, last office visit with PCP 09/05/20.  He reports he is very active and has just completed cardiac rehab and states he will be starting with the PREP program at the [facility] in 02/2022. He reports his blood pressure was stable at one time, but states states he was taken off his Valsartan while completing cardiac rehab due to his blood pressure dropping low and now it seems to be up and down. He reports blood pressure range of 130-165/55-74. He reports last blood pressure reading 143/60. Per patient, congestive heart failure issues were associated with problems with his heart valve with he reports had a procedure in 2009 and TAVR done August 2023. He reports he is now on furosemide on an as needed basis. He states he has an appointment with cardiac pharmacist, but states it is not until January 2024. Patient's primary concern is blood pressure.   Goals Addressed             This Visit's Progress    Blood Pressure Management       Care Coordination Interventions: Discussed importance of seeing primary care provider and encouraged to call to schedule at least an annual visit Discussed how to obtain most accurate blood pressure readings Advised patient to call and/or communicate using "my chart " blood pressure readings if he has any concerns about readings before he is able to see the cardiology pharmacist Reviewed upcoming/scheduled appointments Provided education re: how to check blood pressure Contact number for RNCM provided and encouraged to contact as needed.      SDOH assessments and interventions completed:   Yes  SDOH Interventions Today    Flowsheet Row Most Recent Value  SDOH Interventions   Food Insecurity Interventions Intervention Not Indicated  Housing Interventions Intervention Not Indicated  Transportation Interventions Intervention Not Indicated  Utilities Interventions Intervention Not Indicated     Care Coordination Interventions:  Yes, provided   Follow up plan: Follow up call scheduled for 01/21/22    Encounter Outcome:  Pt. Visit Completed   Luis Silversmith, RN, MSN, BSN, [place] Coordinator [phone]

## 2022-01-19 ENCOUNTER — Encounter: Payer: Self-pay | Admitting: Internal Medicine

## 2022-01-19 NOTE — Telephone Encounter (Signed)
Pt called to report that he has been having elevated BP on the top... 211-941 and his diastolic is consistent in the 60-70 range... he says his HR is in the 70's... he does not feel bad.. no dizziness, headache, chest pain.   He is only taking the Coreg and Lasix... he has been off of the valsartan and asking if Dr Harrington Challenger wants him to go back on it.   I will forward to Dr Harrington Challenger for her review.

## 2022-01-20 ENCOUNTER — Encounter: Payer: Self-pay | Admitting: Internal Medicine

## 2022-01-20 MED ORDER — VALSARTAN 80 MG PO TABS: 80.0000 mg | ORAL_TABLET | Freq: Every day | ORAL | 3 refills | Status: DC

## 2022-01-20 NOTE — Telephone Encounter (Signed)
Would take 1/2 of valsartan  Follow BP

## 2022-01-20 NOTE — Telephone Encounter (Signed)
Pt advised see other open My Chart encounter related to his BP.

## 2022-01-20 NOTE — Telephone Encounter (Signed)
Pt says he stopped at [facility] this morning and his BP was 125/50 HR 38 on their automatic cuff.... he went home and rechecked it and it was 154/68 HR 44.... he is feeling well... no palps, dizziness, headache.

## 2022-01-21 ENCOUNTER — Ambulatory Visit: Payer: Self-pay

## 2022-01-21 NOTE — Patient Instructions (Signed)
Visit Information  Thank you for taking time to visit with me today. Please don't hesitate to contact me if I can be of assistance to you.   Following are the goals we discussed today:   Goals Addressed             This Visit's Progress    Care Coordination Activities-Blood Pressure Management       Care Coordination Interventions:  Advised patient to call and/or communicate using "my chart " blood pressure readings if he has any concerns about readings before he is able to see the cardiology pharmacist Reviewed upcoming/scheduled appointments Reinforced education re: how to check blood pressure Contact number for RNCM provided and encouraged to contact as needed. Evaluation of current treatment plan related to hypertension self management and patient's adherence to plan as established by provider Reviewed medications with patient and discussed importance of compliance Advised patient, providing education and rationale, to monitor blood pressure daily and record, calling PCP for findings outside established parameters Provided education on prescribed diet low sodium heart healthy Reviewed to contact PREP program if he does not have a date to start program by the end of the week pt has contact number          Our next appointment is by telephone on 02/23/22 at 3 PM  Please call the care guide team at [phone] if you need to cancel or reschedule your appointment.   If you are experiencing a Mental Health or [place] or need someone to talk to, please call the Suicide and Crisis Lifeline: 988 call the [place] National Suicide Prevention Lifeline: 234 807 4180 or TTY: (435)567-0906 TTY 785-485-4709) to talk to a trained counselor call 1-800-273-TALK (toll free, 24 hour hotline) go to [facility] [address], [place] [phone]) call 911   Patient verbalizes understanding of instructions and care plan provided today and  agrees to view in [place]. Active MyChart status and patient understanding of how to access instructions and care plan via MyChart confirmed with patient.     Telephone follow up appointment with care management team member scheduled for:02/23/22  Peter Garter RN, [facility], [place] Management Coordinator [place] Management [phone]

## 2022-01-21 NOTE — Patient Outreach (Signed)
  Care Coordination   Follow Up Visit Note   01/21/2022 Name: Luis Herrera MRN: [id number] DOB: 1941-02-07  Luis Herrera is a 81 y.o. year old male who sees [name], MD for primary care. I spoke with  Luis Herrera by phone today.  What matters to the patients health and wellness today?  My B/P has been up and down.  States he restarted the valsartan today as directed by his cardiologist.      Goals Addressed             This Visit's Progress    Care Coordination Activities-Blood Pressure Management       Care Coordination Interventions:  Advised patient to call and/or communicate using "my chart " blood pressure readings if he has any concerns about readings before he is able to see the cardiology pharmacist Reviewed upcoming/scheduled appointments Reinforced education re: how to check blood pressure Contact number for RNCM provided and encouraged to contact as needed. Evaluation of current treatment plan related to hypertension self management and patient's adherence to plan as established by provider Reviewed medications with patient and discussed importance of compliance Advised patient, providing education and rationale, to monitor blood pressure daily and record, calling PCP for findings outside established parameters Provided education on prescribed diet low sodium heart healthy Reviewed to contact PREP program if he does not have a date to start program by the end of the week pt has contact number          SDOH assessments and interventions completed:  Yes  SDOH Interventions Today    Flowsheet Row Most Recent Value  SDOH Interventions   Food Insecurity Interventions Intervention Not Indicated  Housing Interventions Intervention Not Indicated  Transportation Interventions Intervention Not Indicated  Financial Strain Interventions Intervention Not Indicated        Care Coordination Interventions:  Yes, provided   Follow up plan: Follow up call  scheduled for 02/23/22    Encounter Outcome:  Pt. Visit Completed  Peter Garter RN, [facility], [place] Management [phone]

## 2022-01-23 ENCOUNTER — Telehealth: Payer: Self-pay

## 2022-01-23 NOTE — Telephone Encounter (Signed)
Called to discuss PREP classes, he wants to attend 02/17/22 class at [facility] T/Th 12; will contact toward end of Dec to set up assessment visit

## 2022-01-26 ENCOUNTER — Ambulatory Visit: Payer: Medicare Other | Admitting: Internal Medicine

## 2022-02-02 ENCOUNTER — Encounter: Payer: Self-pay | Admitting: Internal Medicine

## 2022-02-02 NOTE — Addendum Note (Signed)
Encounter addended by: Sol Passer on: 02/02/2022 8:52 AM  Actions taken: Vitals modified, Flowsheet data copied forward, Flowsheet accepted

## 2022-02-02 NOTE — Telephone Encounter (Signed)
Called pt in regards to elevated BP reading.  Pt reports checked BP at [place] 139/50 checked BP at home 174/84.  Pt would like to have his BP checked in office to compare with home monitor.  Scheduled NV for 02/03/22 at 2 PM.  Pt will bring in BP cuff.  No further concerns at this time.

## 2022-02-03 ENCOUNTER — Encounter: Payer: Medicare Other | Attending: Internal Medicine | Admitting: Internal Medicine

## 2022-02-03 VITALS — BP 166/63 | HR 70 | Ht 71.0 in | Wt 210.0 lb

## 2022-02-03 DIAGNOSIS — I10 Essential (primary) hypertension: Secondary | ICD-10-CM | POA: Diagnosis not present

## 2022-02-03 DIAGNOSIS — E785 Hyperlipidemia, unspecified: Secondary | ICD-10-CM | POA: Insufficient documentation

## 2022-02-03 DIAGNOSIS — E669 Obesity, unspecified: Secondary | ICD-10-CM | POA: Insufficient documentation

## 2022-02-03 DIAGNOSIS — Z952 Presence of prosthetic heart valve: Secondary | ICD-10-CM | POA: Insufficient documentation

## 2022-02-03 DIAGNOSIS — I2510 Atherosclerotic heart disease of native coronary artery without angina pectoris: Secondary | ICD-10-CM | POA: Insufficient documentation

## 2022-02-03 DIAGNOSIS — I493 Ventricular premature depolarization: Secondary | ICD-10-CM | POA: Insufficient documentation

## 2022-02-03 DIAGNOSIS — Z951 Presence of aortocoronary bypass graft: Secondary | ICD-10-CM | POA: Insufficient documentation

## 2022-02-03 DIAGNOSIS — Z79899 Other long term (current) drug therapy: Secondary | ICD-10-CM | POA: Insufficient documentation

## 2022-02-03 MED ORDER — ENTRESTO 24-26 MG PO TABS: 1.0000 | ORAL_TABLET | Freq: Two times a day (BID) | ORAL | 3 refills | Status: DC

## 2022-02-03 NOTE — Progress Notes (Signed)
   Nurse Visit   Date of Encounter: 02/03/2022 ID: [name], DOB 03/25/1940, MRN [id number]  PCP:  [name], MD   [place] Providers Cardiologist:  [name], MD      Visit Details   VS:  BP (!) 166/63 (BP Location: Left Arm)   Pulse 70   Ht '5\' 11"'$  (1.803 m)   Wt 210 lb (95.3 kg)   SpO2 97%   BMI 29.29 kg/m  , BMI Body mass index is 29.29 kg/m.  Wt Readings from Last 3 Encounters:  02/03/22 210 lb (95.3 kg)  01/07/22 205 lb 4 oz (93.1 kg)  01/01/22 206 lb (93.4 kg)     Reason for visit: Blood pressure check Performed today: Vitals, Provider consulted:DR Harrington Challenger, and Education Changes (medications, testing, etc.) : Stop Valsartan. Start Entresto 24-'26mg'$  BID Length of Visit: 30 minutes  Compared home BP cuff to results of office manual cuff. Patient's cuff reads about 10 mmhg higher than manual cuff. Patient will continue to monitor BP daily and let Dr Harrington Challenger know results.    Medications Adjustments/Labs and Tests Ordered: No orders of the defined types were placed in this encounter.  Meds ordered this encounter  Medications   sacubitril-valsartan (ENTRESTO) 24-26 MG    Sig: Take 1 tablet by mouth 2 (two) times daily.    Dispense:  180 tablet    Refill:  3     Signed, Tor Netters, RN  02/03/2022 3:09 PM

## 2022-02-03 NOTE — Patient Instructions (Signed)
Medication Instructions:  Your physician has recommended you make the following change in your medication:   Stop Valsartan Start Entresto 24-26 mg 2 times daily   *If you need a refill on your cardiac medications before your next appointment, please call your pharmacy*    Follow-Up: At [facility], you and your health needs are our priority.  As part of our continuing mission to provide you with exceptional heart care, we have created designated Provider Care Teams.  These Care Teams include your primary Cardiologist (physician) and Advanced Practice Providers (APPs -  Physician Assistants and Nurse Practitioners) who all work together to provide you with the care you need, when you need it.  Your next appointment:   As scheduled  The format for your next appointment:   In Person  Provider:   [name], MD     Important Information About Sugar

## 2022-02-04 ENCOUNTER — Telehealth: Payer: Self-pay

## 2022-02-04 ENCOUNTER — Encounter: Payer: Self-pay | Admitting: Internal Medicine

## 2022-02-04 MED ORDER — EZETIMIBE 10 MG PO TABS: 10.0000 mg | ORAL_TABLET | Freq: Every day | ORAL | 3 refills | Status: DC

## 2022-02-04 NOTE — Telephone Encounter (Signed)
Called to confirm participation in [place] on 1/2 at [facility], assessment visit scheduled for 12/27 at 10am

## 2022-02-04 NOTE — Telephone Encounter (Signed)
I spoke with patient. He reports Ezetimibe is out of stock at [facility].  [facility] [address]  Prescription sent in

## 2022-02-11 NOTE — Progress Notes (Signed)
[facility] PREP Evaluation  Patient Details  Name: Luis Herrera MRN: [id number] Date of Birth: [birth year] Age: 81 y.o. PCP: [name], MD  Vitals:   02/11/22 1025  BP: (!) 110/58  Pulse: 71  SpO2: 99%  Weight: 206 lb 6.4 oz (93.6 kg)     [facility] Eval - 02/11/22 1000       [facility] "PREP" Location   [facility] "PREP" Location [facility]      Referral    Referring Provider Harrington Challenger    Reason for referral Hypertension;Inactivity    Program Start Date 02/17/22      Measurement   Neck measurement 17 Inches    Waist Circumference 44.5 inches    Body fat 30.7 percent      Information for Trainer   Goals --   Be comfortable using exercise equipment; maintain wt, stabilize BP   Current Exercise none    Orthopedic Concerns --   s/p bilat TKA, L THR   Pertinent Medical History --   aortic valve replacement; HTN, arthritis   Current Barriers --   none   Medications that affect exercise Beta blocker      Timed Up and Go (TUGS)   Timed Up and Go Low risk <9 seconds      Mobility and Daily Activities   I find it easy to walk up or down two or more flights of stairs. 1    I have no trouble taking out the trash. 4    I do housework such as vacuuming and dusting on my own without difficulty. 4    I can easily lift a gallon of milk (8lbs). 4    I can easily walk a mile. 1    I have no trouble reaching into high cupboards or reaching down to pick up something from the floor. 2    I do not have trouble doing out-door work such as Armed forces logistics/support/administrative officer, raking leaves, or gardening. 2      Mobility and Daily Activities   I feel younger than my age. 4    I feel independent. 4    I feel energetic. 2    I live an active life.  4    I feel strong. 4    I feel healthy. 2    I feel active as other people my age. 4      How fit and strong are you.   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 TAli Loweand Dr. BCyndia Bent  Thyroid disease    Past Surgical History:  Procedure Laterality Date   AORTIC VALVE REPLACEMENT  2009   [place]oprosthetic at [place] 2008   CAvonGRAFT  2009   SVG-PDA  w/ AVR   CORRECTION HAMMER TOE  2011   BILATERAL   GASTROCNEMIUS RECESSION  05/28/2011   INTRAOPERATIVE TRANSTHORACIC ECHOCARDIOGRAM N/A 09/30/2021   Procedure: INTRAOPERATIVE TRANSTHORACIC ECHOCARDIOGRAM;  Surgeon: Early Osmond, MD;  Location: [place] [facility];  Service: Open Heart Surgery;  Laterality: N/A;   POLYPECTOMY     RIGHT/LEFT HEART CATH AND CORONARY/GRAFT ANGIOGRAPHY N/A 09/24/2021   Procedure: RIGHT/LEFT HEART CATH AND CORONARY/GRAFT ANGIOGRAPHY;  Surgeon: Early Osmond, MD;  Location: 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

## 2022-02-17 DIAGNOSIS — L84 Corns and callosities: Secondary | ICD-10-CM | POA: Diagnosis not present

## 2022-02-17 DIAGNOSIS — I739 Peripheral vascular disease, unspecified: Secondary | ICD-10-CM | POA: Diagnosis not present

## 2022-02-17 DIAGNOSIS — L602 Onychogryphosis: Secondary | ICD-10-CM | POA: Diagnosis not present

## 2022-02-17 NOTE — Progress Notes (Signed)
[facility] PREP Weekly Session  Patient Details  Name: Luis Herrera MRN: [id number] Date of Birth: 1940/11/06 Age: 82 y.o. PCP: [name], MD  There were no vitals filed for this visit.   [facility] Weekly seesion - 02/17/22 1400       [facility] "PREP" Location   [facility] "PREP" Location [facility]      Weekly Session   Topic Discussed Goal setting and welcome to the program   Introductions, review of notebook, Tour of facility; today's tip: avoid sitting no more than 30 minutes            Refugio Mcconico B Aspen Lawrance 02/17/2022, 2:04 PM

## 2022-02-19 ENCOUNTER — Telehealth: Payer: Medicare Other | Admitting: Family Medicine

## 2022-02-19 ENCOUNTER — Encounter: Payer: Medicare Other | Attending: Internal Medicine | Admitting: Pharmacist

## 2022-02-19 VITALS — BP 118/52 | HR 69

## 2022-02-19 DIAGNOSIS — Z538 Procedure and treatment not carried out for other reasons: Secondary | ICD-10-CM

## 2022-02-19 DIAGNOSIS — I10 Essential (primary) hypertension: Secondary | ICD-10-CM | POA: Diagnosis not present

## 2022-02-19 NOTE — Assessment & Plan Note (Signed)
Assessment: Blood pressure at goal in clinic today, patient reports blood pressure being well controlled at home although does not bring in any specific blood pressure readings Denies any symptoms of low blood pressure Working on increasing his exercise and [facility] Plan: Continue current medications amlodipine 5 mg daily, furosemide 20 mg daily as needed, carvedilol 6.25 mg twice a day and Entresto 24/26 mg twice a day Patient was instructed to call me if systolic blood pressure was consistently less than 105 or consistently greater than 140. If blood pressure drops again would consider decreasing or stopping amlodipine Follow-up as needed

## 2022-02-19 NOTE — Progress Notes (Addendum)
Patient ID: Luis Herrera                 DOB: 1940/11/23                      MRN: [id number]      HPI: Luis Herrera is a 82 y.o. male referred by Dr. Harrington Challenger to HTN clinic. PMH is significant for h/o CAD, s/p CABG, AS, s/p AVR, HTN, PVC.  Patient's Luis Herrera was stopped in Nov due to low blood pressure at cardiac rehab. He messaged in Dec reporting high blood pressures. Intially valsartan was started. He had a nurses visit 12/19. Home BP cuff found to be 10 points high. His valsartan was switched to Entresto 24/'26mg'$  twice a day, was given 2 weeks of samples. Blood pressure at [facility] prep class was 110/58.   Patient presents today.  He reports that his blood pressure was well-controlled until he had to stop Entresto due to cost.  He has since resumed Entresto since he is no longer in the donut hole.  We discussed patient assistance when he hits the donut hole this year.  He denies dizziness, lightheadedness, shortness of breath or swelling.  Did not get a new home cuff but is aware his home cuff runs about 10 points higher.  Current HTN meds: amlodipine '5mg'$  daily, furosemide '20mg'$  daily as needed, carvedilol 6.'25mg'$  twice a day, Entresto 24/26 mg BID BP goal: <130/80 (could consider <140)  Family History:  Family History  Problem Relation Age of Onset   Colon cancer Mother 78   Heart attack Father    Hypertension Other    Hyperlipidemia Other    Heart attack Brother    Hypertension Sister    Hypertension Brother    Stroke Neg Hx    Esophageal cancer Neg Hx    Rectal cancer Neg Hx    Stomach cancer Neg Hx     Social History:  Social History   Socioeconomic History   Marital status: Married    Spouse name: Not on file   Number of children: 2   Years of education: 12   Highest education level: High school graduate  Occupational History   Occupation: retired  Tobacco Use   Smoking status: Former    Types: Cigarettes    Quit date: 05/25/1973    Years since quitting: 48.7   Smokeless  tobacco: Never  Vaping Use   Vaping Use: Never used  Substance and Sexual Activity   Alcohol use: Not Currently    Comment: rare   Drug use: No   Sexual activity: Not Currently  Other Topics Concern   Not on file  Social History Narrative   Regular exercise-yes. Pt lives in [place] with his wife.   Social Determinants of Health   Financial Resource Strain: Low Risk  (01/21/2022)   Overall Financial Resource Strain (CARDIA)    Difficulty of Paying Living Expenses: Not hard at all  Food Insecurity: No Food Insecurity (01/21/2022)   Hunger Vital Sign    Worried About Running Out of Food in the Last Year: Never true    Ran Out of Food in the Last Year: Never true  Transportation Needs: No Transportation Needs (01/21/2022)   PRAPARE - Hydrologist (Medical): No    Lack of Transportation (Non-Medical): No  Physical Activity: Not on file  Stress: Not on file  Social Connections: Not on file  Intimate Partner Violence: Not At  Risk (11/04/2021)   Humiliation, Afraid, Rape, and Kick questionnaire    Fear of Current or Ex-Partner: No    Emotionally Abused: No    Physically Abused: No    Sexually Abused: No    Exercise: He previously walked several days a week.  Prefers to walk outside.  He has completed cardiac rehab.  He is now doing the [facility] prep class, excited about learning how to use the machines.  Plans to increase his walking.   Home BP readings:  Blood pressure at [facility] prep class was 110/58.    Wt Readings from Last 3 Encounters:  02/11/22 206 lb 6.4 oz (93.6 kg)  02/03/22 210 lb (95.3 kg)  01/07/22 205 lb 4 oz (93.1 kg)   BP Readings from Last 3 Encounters:  02/19/22 (!) 118/52  02/11/22 (!) 110/58  02/03/22 (!) 166/63   Pulse Readings from Last 3 Encounters:  02/19/22 69  02/11/22 71  02/03/22 70    Renal function: CrCl cannot be calculated (Patient's most recent lab result is older than the maximum 21 days  allowed.).  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 TAli Loweand Dr. BCyndia Bent  Thyroid disease     Current Outpatient Medications on File Prior to Visit  Medication Sig Dispense Refill   amiodarone (PACERONE) 200 MG tablet Take half tablet ( 100 mg ) by mouth daily; Monday through Friday. Then: Take 1 tablet ( 200 mg ) by mouth a day, on Saturday and Sunday ONLY. 45 tablet 3   amLODipine (NORVASC) 5 MG tablet Take 1 tablet (5 mg total) by mouth daily. 90 tablet 3   aspirin EC 81 MG tablet Take 1 tablet (81 mg total) by mouth daily. Swallow whole. 90 tablet 3   atorvastatin (LIPITOR) 80 MG tablet TAKE 1 TABLET BY MOUTH ONCE  DAILY 90 tablet 3   Calcium Carb-Cholecalciferol (CALCIUM PLUS VITAMIN D3 PO) Take 2 tablets by mouth daily. Gummy supplements     carvedilol (COREG) 6.25 MG tablet TAKE 1 TABLET BY MOUTH TWICE  DAILY 180 tablet 2   ezetimibe (ZETIA) 10 MG tablet Take 1 tablet (10 mg total) by mouth daily. 90 tablet 3   furosemide (LASIX) 20 MG tablet Take 1 tablet (20 mg total) by mouth daily. (Patient taking differently: Take 20 mg by mouth daily. Takes as needed last dose on tuesday)     levothyroxine (SYNTHROID) 50 MCG tablet TAKE 1 TABLET BY MOUTH DAILY  BEFORE BREAKFAST 90 tablet 3   Multiple Vitamins-Minerals (PRESERVISION AREDS 2) CAPS Take 1 capsule by mouth 2 (two) times daily.     nitrofurantoin, macrocrystal-monohydrate, (MACROBID) 100 MG capsule Take 100 mg by mouth at bedtime.     sacubitril-valsartan (ENTRESTO) 24-26 MG Take 1 tablet by mouth  2 (two) times daily. 180 tablet 3   tamsulosin (FLOMAX) 0.4 MG CAPS capsule Take 0.4 mg by mouth in the morning and at bedtime.     No current  facility-administered medications on file prior to visit.    Allergies  Allergen Reactions   Sulfa Antibiotics Other (See Comments)    Carlyn Reichert Syndrome   Bactrim [Sulfamethoxazole-Trimethoprim] Other (See Comments)    Carlyn Reichert Syndrome    Blood pressure (!) 118/52, pulse 69, SpO2 98 %.   Assessment/Plan:  1. Hypertension -  Essential hypertension Assessment: Blood pressure at goal in clinic today, patient reports blood pressure being well controlled at home although does not bring in any specific blood pressure readings Denies any symptoms of low blood pressure Working on increasing his exercise [facility]: Continue current medications amlodipine 5 mg daily, furosemide 20 mg daily as needed, carvedilol 6.25 mg twice a day and Entresto 24/26 mg twice a day Patient was instructed to call me if systolic blood pressure was consistently less than 105 or consistently greater than 140. If blood pressure drops again would consider decreasing or stopping amlodipine Follow-up as needed   Thank you  Ramond Dial, Pharm.D, BCPS, CPP [facility] [place] [address] [place], [place] [address]  Phone: [phone]; Fax: [phone]

## 2022-02-19 NOTE — Patient Instructions (Addendum)
Summary of today's discussion  1.Continue amlodipine '5mg'$  daily, furosemide '20mg'$  daily, carvedilol 6.'25mg'$  twice a day, Entresto 24/'26mg'$  twice a day  2. Check blood pressure at home a few times a week  3. Please call me if blood pressure is low consistently (top number <105) or high (top number >140 consistently  4. Add back walking and weight training  5. Call me [phone] with any questions   Your blood pressure goal is <130/80  To check your pressure at home you will need to:  1. Sit up in a chair, with feet flat on the floor and back supported. Do not cross your ankles or legs. 2. Rest your left arm so that the cuff is about heart level. If the cuff goes on your upper arm,  then just relax the arm on the table, arm of the chair or your lap. If you have a wrist cuff, we  suggest relaxing your wrist against your chest (think of it as Pledging the Flag with the  wrong arm).  3. Place the cuff snugly around your arm, about 1 inch above the crook of your elbow. The  cords should be inside the groove of your elbow.  4. Sit quietly, with the cuff in place, for about 5 minutes. After that 5 minutes press the power  button to start a reading. 5. Do not talk or move while the reading is taking place.  6. Record your readings on a sheet of paper. Although most cuffs have a memory, it is often  easier to see a pattern developing when the numbers are all in front of you.  7. You can repeat the reading after 1-3 minutes if it is recommended  Make sure your bladder is empty and you have not had caffeine or tobacco within the last 30 min  Always bring your blood pressure log with you to your appointments. If you have not brought your monitor in to be double checked for accuracy, please bring it to your next appointment.  You can find a list of validated (accurate) blood pressure cuffs at PopPath.it   Important lifestyle changes to control high blood pressure  Intervention  Effect on the  BP  Lose extra pounds and watch your waistline Weight loss is one of the most effective lifestyle changes for controlling blood pressure. If you're overweight or obese, losing even a small amount of weight can help reduce blood pressure. Blood pressure might go down by about 1 millimeter of mercury (mm Hg) with each kilogram (about 2.2 pounds) of weight lost.  Exercise regularly As a general goal, aim for at least 30 minutes of moderate physical activity every day. Regular physical activity can lower high blood pressure by about 5 to 8 mm Hg.  Eat a healthy diet Eating a diet rich in whole grains, fruits, vegetables, and low-fat dairy products and low in saturated fat and cholesterol. A healthy diet can lower high blood pressure by up to 11 mm Hg.  Reduce salt (sodium) in your diet Even a small reduction of sodium in the diet can improve heart health and reduce high blood pressure by about 5 to 6 mm Hg.  Limit alcohol One drink equals 12 ounces of beer, 5 ounces of wine, or 1.5 ounces of 80-proof liquor.  Limiting alcohol to less than one drink a day for women or two drinks a day for men can help lower blood pressure by about 4 mm Hg.   Please call me at [phone] with  any questions.

## 2022-02-19 NOTE — Progress Notes (Signed)
Per nurse assistant on check in, patient requested to reschedule do to rehab.

## 2022-02-23 ENCOUNTER — Ambulatory Visit: Payer: Self-pay

## 2022-02-23 NOTE — Patient Outreach (Signed)
  Care Coordination   02/23/2022 Name: Luis Herrera MRN: [id number] DOB: December 16, 1940   Care Coordination Outreach Attempts:  An unsuccessful telephone outreach was attempted for a scheduled appointment today.  Follow Up Plan:  Additional outreach attempts will be made to offer the patient care coordination information and services.   Encounter Outcome:  No Answer   Care Coordination Interventions:  No, not indicated    Peter Garter RN, BSN,CCM, CDE Care Management Coordinator [place] [facility] [phone]

## 2022-02-25 ENCOUNTER — Telehealth: Payer: Self-pay | Admitting: Pharmacist

## 2022-02-25 NOTE — Telephone Encounter (Signed)
Patient called stating that yesterday his blood pressure was 160/80 with a heart rate of 104 and he felt a little lightheaded.  Today his blood pressure was 103/53 with a normal heart rate.  Feels better today.  The night before last he did not sleep well, this may have been why his blood pressure was elevated and he did not feel as well.  Would not make any changes with his blood pressure medicines considering the lower blood pressure today.  I advised patient to let us know if his blood pressure stays consistently elevated.

## 2022-02-25 NOTE — Progress Notes (Signed)
Rescheduled 03/02/22  [place]  Direct Dial: [phone]

## 2022-03-02 ENCOUNTER — Ambulatory Visit: Payer: Self-pay

## 2022-03-02 NOTE — Patient Instructions (Signed)
Visit Information  Thank you for taking time to visit with me today. Please don't hesitate to contact me if I can be of assistance to you.   Following are the goals we discussed today:   Goals Addressed             This Visit's Progress    Care Coordination Activities-Blood Pressure Management       Care Coordination Interventions:  Advised patient to call and/or communicate using "my chart " blood pressure readings if he has any concerns about readings being consistently higher Reviewed upcoming/scheduled appointments  Barrington Ellison PA 05/26/22 Reinforced education re: how to check blood pressure Contact number for RNCM provided and encouraged to contact as needed. Evaluation of current treatment plan related to hypertension self management and patient's adherence to plan as established by provider Reviewed medications with patient and discussed importance of compliance Advised patient, providing education and rationale, to monitor blood pressure daily and record, calling PCP for findings outside established parameters Provided education on prescribed diet low sodium heart healthy Discussed complications of poorly controlled blood pressure such as heart disease, stroke, circulatory complications, vision complications, kidney impairment, sexual dysfunction Reviewed to look into chair yoga or Tai Chi  classes at the [facility] to help with stress          Our next appointment is by telephone on 04/08/22 at 10 AM  Please call the care guide team at [phone] if you need to cancel or reschedule your appointment.   If you are experiencing a Mental Health or [place] or need someone to talk to, please call the Suicide and Crisis Lifeline: 988 call the [place] National Suicide Prevention Lifeline: 438-076-0761 or TTY: 716-211-7930 TTY 516-800-4506) to talk to a trained counselor call 1-800-273-TALK (toll free, 24 hour hotline) go to [facility]  Urgent Care [address] [place] [phone]) call 911   Patient verbalizes understanding of instructions and care plan provided today and agrees to view in [place]. Active MyChart status and patient understanding of how to access instructions and care plan via MyChart confirmed with patient.     Telephone follow up appointment with care management team member scheduled for: 04/08/22  Peter Garter RN, [facility] [place], [place] [facility] [phone]

## 2022-03-02 NOTE — Patient Outreach (Signed)
  Care Coordination   Follow Up Visit Note   03/02/2022 Name: Luis Herrera MRN: [id number] DOB: April 26, 1940  Kraig Genis is a 82 y.o. year old male who sees [name], MD for primary care. I spoke with  [name] by phone today.  What matters to the patients health and wellness today?  States he had a bad week last week with more stress and his B/P went up.  States he is better this week and his B/P is back down to the 120-150 range.  Denies any swelling, shortness of breath or chest pains.  States his weight is ranging 198-201.  States he has started the [employer] and is enjoying it so far.    Goals Addressed             This Visit's Progress    Care Coordination Activities-Blood Pressure Management       Care Coordination Interventions:  Advised patient to call and/or communicate using "my chart " blood pressure readings if he has any concerns about readings being consistently higher Reviewed upcoming/scheduled appointments  Barrington Ellison PA 05/26/22 Reinforced education re: how to check blood pressure Contact number for RNCM provided and encouraged to contact as needed. Evaluation of current treatment plan related to hypertension self management and patient's adherence to plan as established by provider Reviewed medications with patient and discussed importance of compliance Advised patient, providing education and rationale, to monitor blood pressure daily and record, calling PCP for findings outside established parameters Provided education on prescribed diet low sodium heart healthy Discussed complications of poorly controlled blood pressure such as heart disease, stroke, circulatory complications, vision complications, kidney impairment, sexual dysfunction Reviewed to look into chair yoga or Tai Chi  classes at the [facility] to help with stress          SDOH assessments and interventions completed:  No     Care Coordination Interventions:  Yes, provided    Follow up plan: Follow up call scheduled for 04/08/22    Encounter Outcome:  Pt. Visit Completed  Peter Garter RN, [facility], [place] [facility] [phone]

## 2022-03-04 DIAGNOSIS — R300 Dysuria: Secondary | ICD-10-CM | POA: Diagnosis not present

## 2022-03-04 DIAGNOSIS — N3020 Other chronic cystitis without hematuria: Secondary | ICD-10-CM | POA: Diagnosis not present

## 2022-03-17 NOTE — Progress Notes (Signed)
[facility] PREP Weekly Session  Patient Details  Name: Luis Herrera MRN: [id number] Date of Birth: 1940-11-12 Age: 82 y.o. PCP: [name], MD  Vitals:   03/17/22 1426  Weight: 205 lb (93 kg)     [facility] Weekly seesion - 03/17/22 1400       [facility] "PREP" Location   [facility] "PREP" Location [place]      Weekly Session   Topic Discussed Health habits   Sugar demo; apps: Yuka, my fitness pal, insight timer, [employer]   Minutes exercised this week 75 minutes    Classes attended to date [place] 03/17/2022, 2:27 PM

## 2022-03-25 ENCOUNTER — Telehealth: Payer: Self-pay | Admitting: Family Medicine

## 2022-03-25 DIAGNOSIS — L602 Onychogryphosis: Secondary | ICD-10-CM | POA: Diagnosis not present

## 2022-03-25 DIAGNOSIS — L84 Corns and callosities: Secondary | ICD-10-CM | POA: Diagnosis not present

## 2022-03-25 NOTE — Telephone Encounter (Signed)
Left message for patient to call back and schedule Medicare Annual Wellness Visit (AWV) either virtually or in office. Left  my Herbie Drape number [phone]   Last AWV 08/17/11 please schedule with Nurse Health Adviser   45 min for awv-i  in office appointments 30 min for awv-s & awv-i phone/virtual appointments

## 2022-03-29 ENCOUNTER — Other Ambulatory Visit: Payer: Self-pay | Admitting: Internal Medicine

## 2022-04-08 ENCOUNTER — Ambulatory Visit: Payer: Self-pay

## 2022-04-08 NOTE — Patient Instructions (Signed)
Visit Information  Thank you for taking time to visit with me today. Please don't hesitate to contact me if I can be of assistance to you.   Following are the goals we discussed today:   Goals Addressed             This Visit's Progress    Care Coordination Activities-Blood Pressure Management       Care Coordination Interventions:  Advised patient to call and/or communicate using "my chart " blood pressure readings if he has any concerns about readings being consistently higher Reviewed upcoming/scheduled appointments  Barrington Ellison PA 06/15/22 Reinforced education re: how to check blood pressure Contact number for RNCM provided and encouraged to contact as needed. Evaluation of current treatment plan related to hypertension self management and patient's adherence to plan as established by provider Provided education to patient re: stroke prevention, s/s of heart attack and stroke Reviewed medications with patient and discussed importance of compliance Counseled on the importance of exercise goals with target of 150 minutes per week Advised patient, providing education and rationale, to monitor blood pressure daily and record, calling PCP for findings outside established parameters Provided education on prescribed diet Reinforced low sodium heart healthy Reinforced to look into chair yoga or Tai Chi  classes at the [facility] to help with stress Interventions Today    Flowsheet Row Most Recent Value  Chronic Disease   Chronic disease during today's visit Hypertension (HTN)  General Interventions   General Interventions Discussed/Reviewed General Interventions Discussed, Doctor Visits  Doctor Visits Discussed/Reviewed Doctor Visits Reviewed, Specialist  PCP/Specialist Visits Compliance with follow-up visit  Exercise Interventions   Exercise Discussed/Reviewed Exercise Discussed, Physical Activity  Physical Activity Discussed/Reviewed Physical Activity Discussed, PREP, Types of  exercise  Education Interventions   Education Provided Provided Education  Provided Verbal Education On Nutrition, Exercise  Nutrition Interventions   Nutrition Discussed/Reviewed Nutrition Reviewed, Decreasing salt, Decreasing sugar intake               Our next appointment is by telephone on 07/07/22 at 10 AM  Please call the care guide team at [phone] if you need to cancel or reschedule your appointment.   If you are experiencing a Mental Health or [place] or need someone to talk to, please call the Suicide and Crisis Lifeline: 988 call the [place] National Suicide Prevention Lifeline: 650-534-2109 or TTY: 902-660-4660 TTY [phone]) to talk to a trained counselor call 1-800-273-TALK (toll free, 24 hour hotline) go to [facility] Urgent Care [address], [place] [phone]) call 911   Patient verbalizes understanding of instructions and care plan provided today and agrees to view in [place]. Active MyChart status and patient understanding of how to access instructions and care plan via MyChart confirmed with patient.     Telephone follow up appointment with care management team member scheduled for:07/07/22  Peter Garter RN, [facility], CDE Care Management Coordinator [place] [facility] [phone]

## 2022-04-08 NOTE — Patient Outreach (Signed)
  Care Coordination   Follow Up Visit Note   04/08/2022 Name: Luis Herrera MRN: [id number] DOB: Jul 24, 1940  Luis Herrera is a 82 y.o. year old male who sees [name], MD for primary care. I spoke with  [name] by phone today.  What matters to the patients health and wellness today?  States he is feeling good. States his B/P has been around 117/73.  States he stopped the PREP program as he had learned most of that information in cardiac rehab.  States he is going to the [facility] 3-4 times a week for 30-60 minutes.  States he is tolerating well and is working on increasing his time exercising    Goals Addressed             This Visit's Progress    Care Coordination Activities-Blood Pressure Management       Care Coordination Interventions:  Advised patient to call and/or communicate using "my chart " blood pressure readings if he has any concerns about readings being consistently higher Reviewed upcoming/scheduled appointments  Barrington Ellison PA 06/15/22 Reinforced education re: how to check blood pressure Contact number for RNCM provided and encouraged to contact as needed. Evaluation of current treatment plan related to hypertension self management and patient's adherence to plan as established by provider Provided education to patient re: stroke prevention, s/s of heart attack and stroke Reviewed medications with patient and discussed importance of compliance Counseled on the importance of exercise goals with target of 150 minutes per week Advised patient, providing education and rationale, to monitor blood pressure daily and record, calling PCP for findings outside established parameters Provided education on prescribed diet Reinforced low sodium heart healthy Reinforced to look into chair yoga or Tai Chi  classes at the [facility] to help with stress Interventions Today    Flowsheet Row Most Recent Value  Chronic Disease   Chronic disease during today's visit Hypertension  (HTN)  General Interventions   General Interventions Discussed/Reviewed General Interventions Discussed, Doctor Visits  Doctor Visits Discussed/Reviewed Doctor Visits Reviewed, Specialist  PCP/Specialist Visits Compliance with follow-up visit  Exercise Interventions   Exercise Discussed/Reviewed Exercise Discussed, Physical Activity  Physical Activity Discussed/Reviewed Physical Activity Discussed, PREP, Types of exercise  Education Interventions   Education Provided Provided Education  Provided Verbal Education On Nutrition, Exercise  Nutrition Interventions   Nutrition Discussed/Reviewed Nutrition Reviewed, Decreasing salt, Decreasing sugar intake               SDOH assessments and interventions completed:  Yes  SDOH Interventions Today    Flowsheet Row Most Recent Value  SDOH Interventions   Physical Activity Interventions Intervention Not Indicated        Care Coordination Interventions:  Yes, provided   Follow up plan: Follow up call scheduled for 07/07/22    Encounter Outcome:  Pt. Visit Completed  Peter Garter RN, [facility], [place] [facility] [phone]

## 2022-04-09 ENCOUNTER — Other Ambulatory Visit: Payer: Self-pay | Admitting: Internal Medicine

## 2022-04-27 DIAGNOSIS — R350 Frequency of micturition: Secondary | ICD-10-CM | POA: Diagnosis not present

## 2022-04-28 NOTE — Progress Notes (Signed)
Did not complete the course; attended 2 education sessions and 2 workout sessions, last class attended was on 03/17/22

## 2022-04-30 DIAGNOSIS — L814 Other melanin hyperpigmentation: Secondary | ICD-10-CM | POA: Diagnosis not present

## 2022-04-30 DIAGNOSIS — D2272 Melanocytic nevi of left lower limb, including hip: Secondary | ICD-10-CM | POA: Diagnosis not present

## 2022-04-30 DIAGNOSIS — L570 Actinic keratosis: Secondary | ICD-10-CM | POA: Diagnosis not present

## 2022-04-30 DIAGNOSIS — Z85828 Personal history of other malignant neoplasm of skin: Secondary | ICD-10-CM | POA: Diagnosis not present

## 2022-04-30 DIAGNOSIS — D485 Neoplasm of uncertain behavior of skin: Secondary | ICD-10-CM | POA: Diagnosis not present

## 2022-04-30 DIAGNOSIS — D044 Carcinoma in situ of skin of scalp and neck: Secondary | ICD-10-CM | POA: Diagnosis not present

## 2022-04-30 DIAGNOSIS — D1801 Hemangioma of skin and subcutaneous tissue: Secondary | ICD-10-CM | POA: Diagnosis not present

## 2022-04-30 DIAGNOSIS — L821 Other seborrheic keratosis: Secondary | ICD-10-CM | POA: Diagnosis not present

## 2022-05-01 ENCOUNTER — Other Ambulatory Visit: Payer: Self-pay | Admitting: Internal Medicine

## 2022-05-01 MED ORDER — FUROSEMIDE 20 MG PO TABS: 20.0000 mg | ORAL_TABLET | Freq: Every day | ORAL | 8 refills | Status: DC

## 2022-05-06 DIAGNOSIS — M21962 Unspecified acquired deformity of left lower leg: Secondary | ICD-10-CM | POA: Diagnosis not present

## 2022-05-06 DIAGNOSIS — M21961 Unspecified acquired deformity of right lower leg: Secondary | ICD-10-CM | POA: Diagnosis not present

## 2022-05-06 DIAGNOSIS — D2372 Other benign neoplasm of skin of left lower limb, including hip: Secondary | ICD-10-CM | POA: Diagnosis not present

## 2022-05-08 IMAGING — CT CT FEMUR *L* W/O CM
2 of 3 series · 13 of 35 positions shown, 16 images · non-contrast
Comparison: None.

CLINICAL DATA: New soft tissue mass, spontaneous hemorrhage

EXAM:
CT OF THE LOWER LEFT EXTREMITY WITHOUT CONTRAST
TECHNIQUE: Multidetector CT imaging of the lower left extremity was performed
according to the standard protocol.

[Series 4: lower ext 1.5 st · axial · 0.55mm/px · z∈[-614,-90]mm · 10 of 413 slices shown, 13 images]
[im 32/413  soft-tissue]
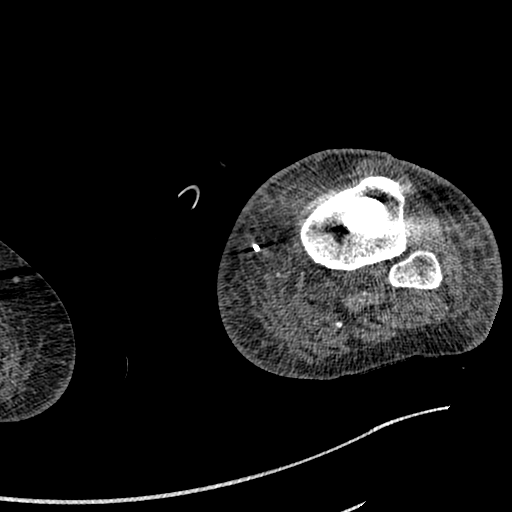
[im 32/413  bone]
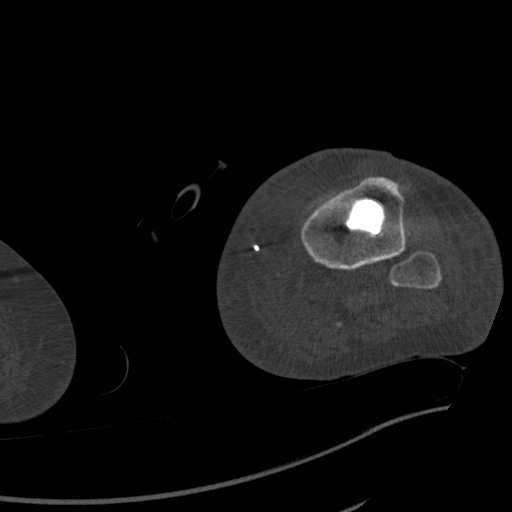
[im 64/413  bone]
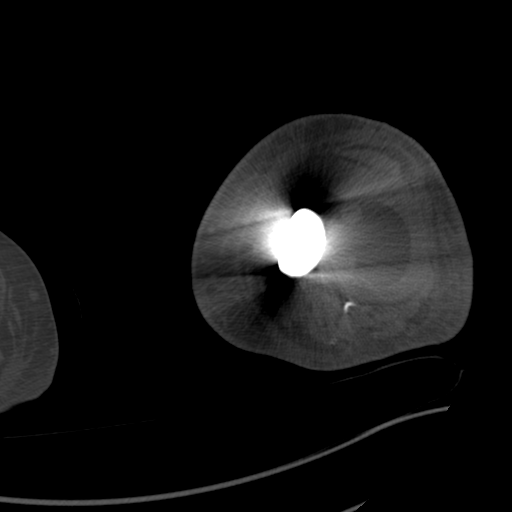
[im 127/413  bone]
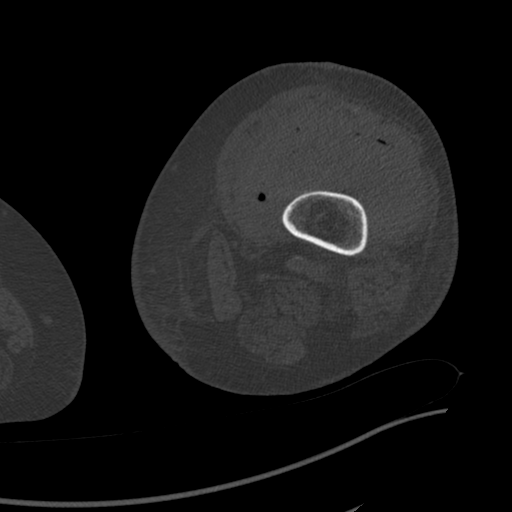
[im 159/413  bone]
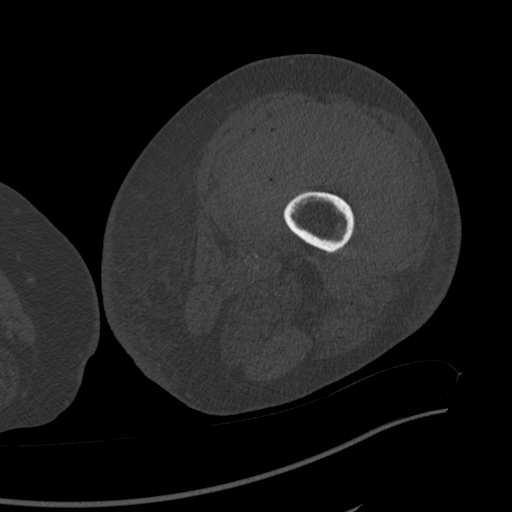
[im 191/413  soft-tissue]
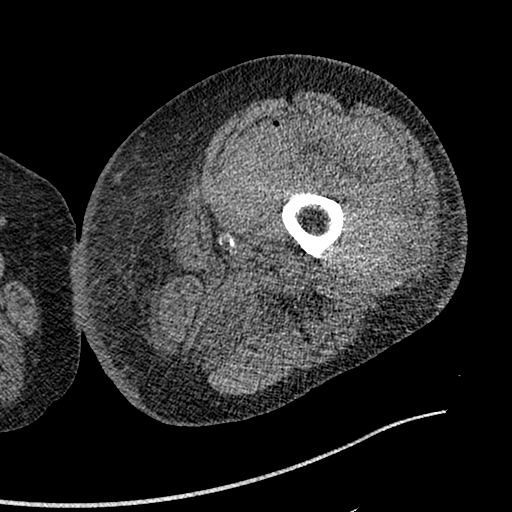
[im 191/413  bone]
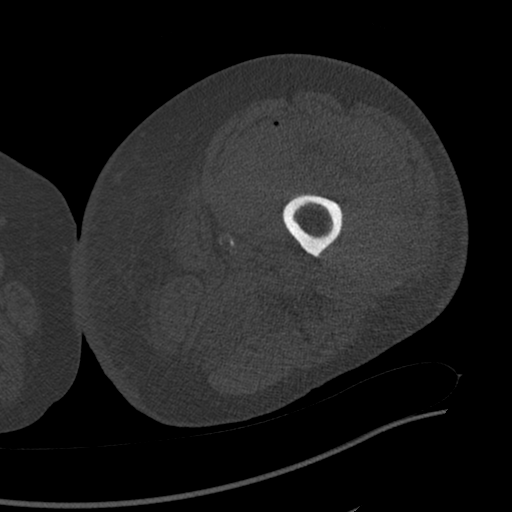
[im 222/413  bone]
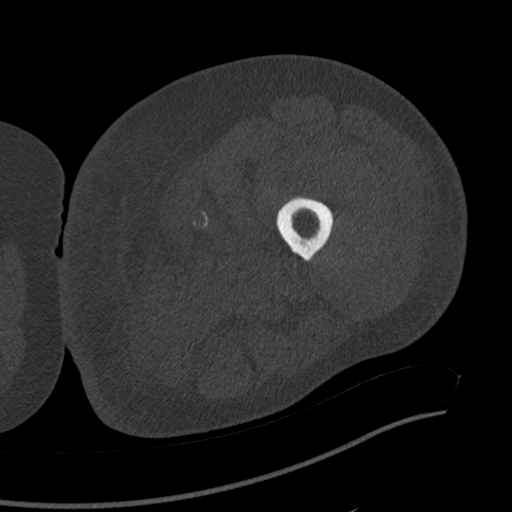
[im 254/413  bone]
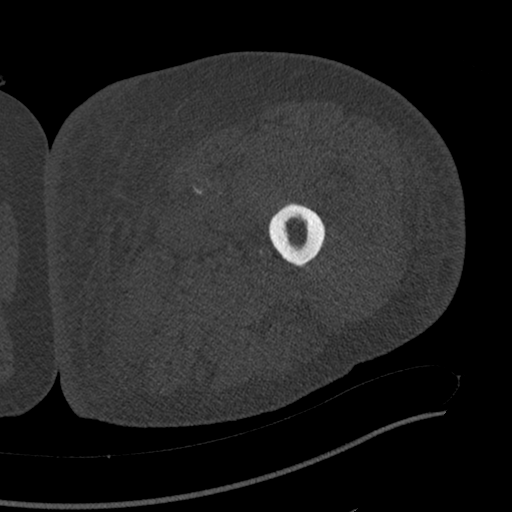
[im 317/413  bone]
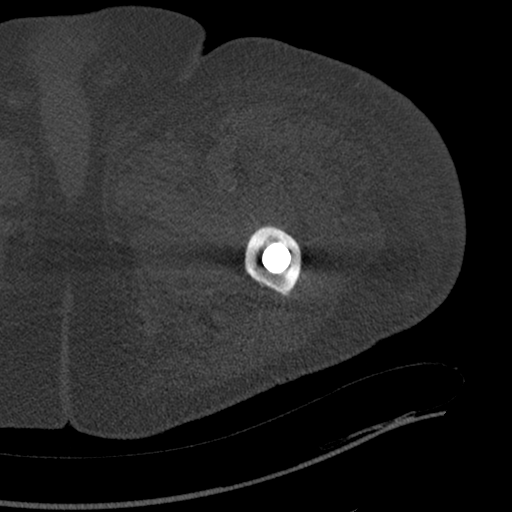
[im 349/413  soft-tissue]
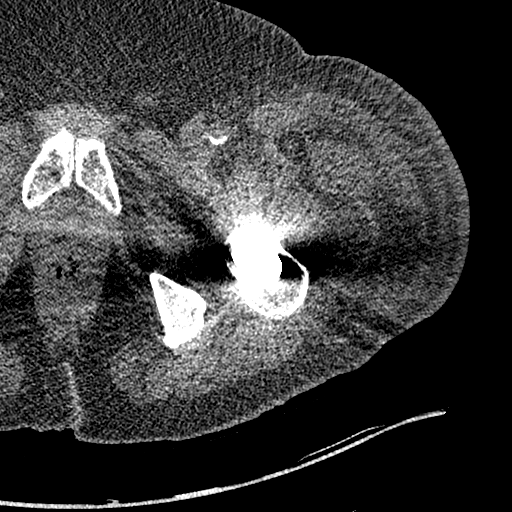
[im 349/413  bone]
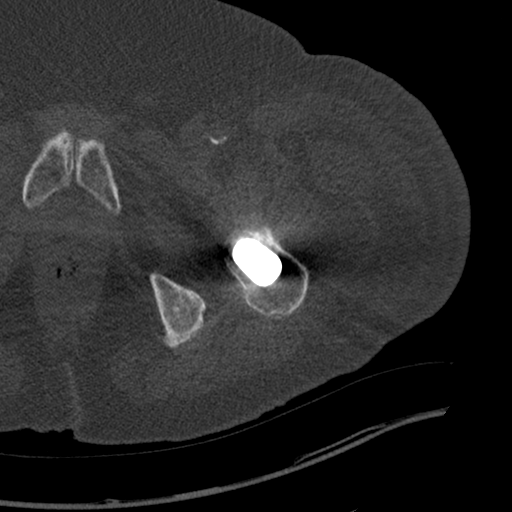
[im 381/413  bone]
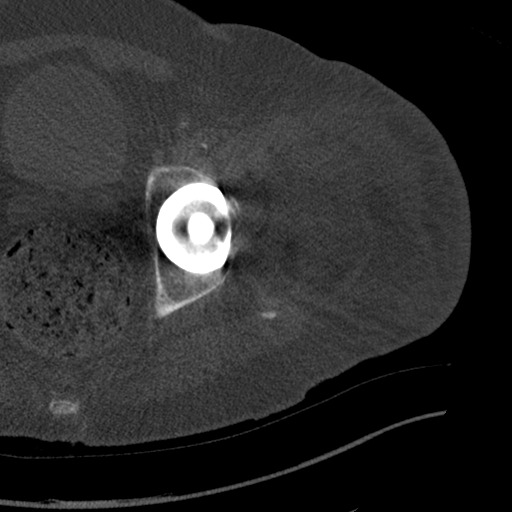

[Series 9: lower ext cor st · coronal · 0.63mm/px · 3 of 167 slices shown]
[im 34/167  bone]
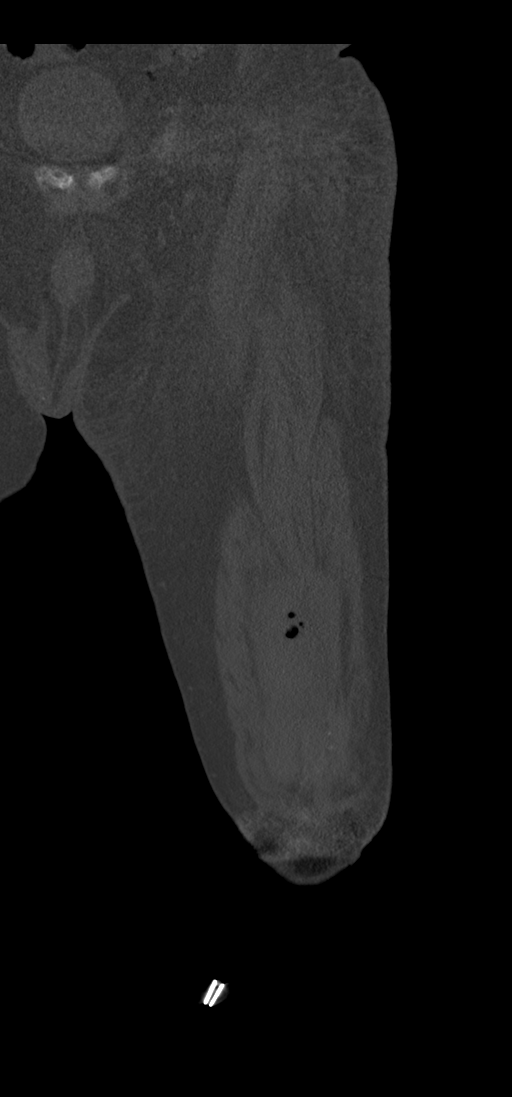
[im 67/167  bone]
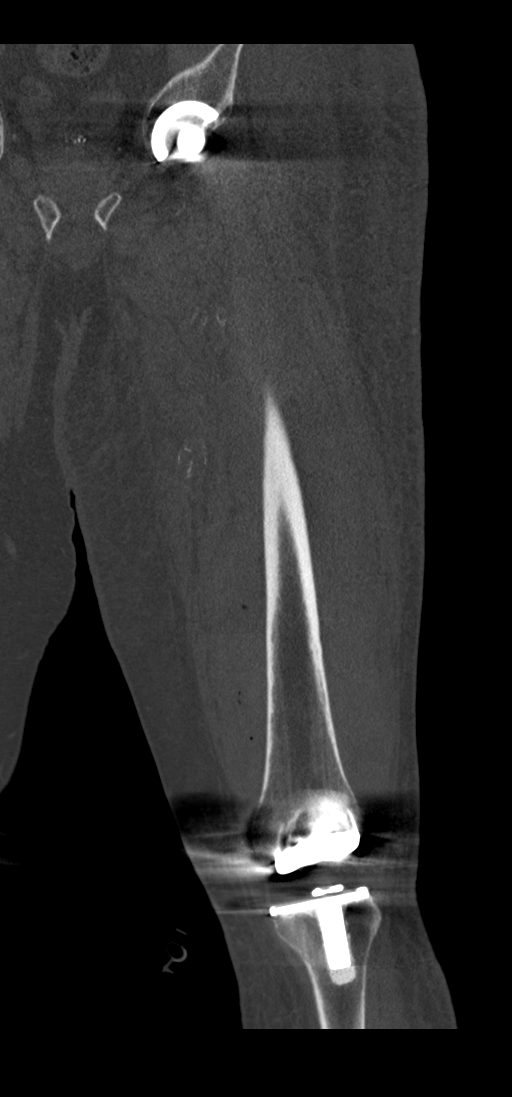
[im 100/167  bone]
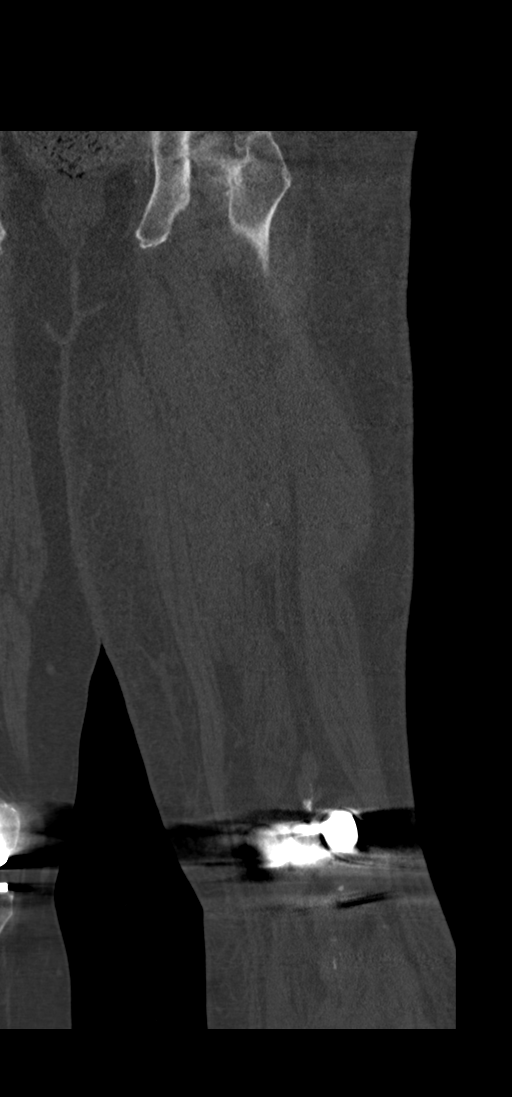

[13 of 35 positions shown; findings below may reference images not displayed]

FINDINGS: Bones/Joint/Cartilage

The patient is status post left total hip arthroplasty and left
total knee arthroplasty. No periprosthetic lucency or fracture is
identified. The joint spaces appear to be well maintained. There is
complex hemorrhagic collections seen within the suprapatellar joint
space extending superiorly.

Ligaments

Suboptimally assessed by CT.

Muscles and Tendons

Complex hyperdense hemorrhagic collection within the anterior
quadriceps musculature measuring approximately 9.7 x 4.8 by 18.9 cm.
The collection contains small foci air is well and extends to the
level of the mid femur. The remainder of the muscles appear to be
intact. The quadriceps and patellar tendons are intact.

Soft tissues

There is diffuse subcutaneous edema and skin thickening seen
surrounding the mid to lower femur. No subcutaneous emphysema within
the soft tissues. Scattered vascular calcifications are noted.
IMPRESSION: Large complex hemorrhagic collection extending in the suprapatellar
joint space and within the quadriceps musculature to the level of
the mid femur. There are small foci of air seen within the
collection and this could represent infected hematoma.

Diffuse subcutaneous edema and skin thickening which could be due to
cellulitis. No subcutaneous emphysema.

No acute osseous abnormality.

## 2022-05-09 ENCOUNTER — Other Ambulatory Visit: Payer: Self-pay | Admitting: Internal Medicine

## 2022-05-09 IMAGING — DX DG CHEST 1V PORT
1 series · 1 of 1 positions shown · non-contrast
Comparison: Portable chest 09/13/2019 and earlier.

CLINICAL DATA: 79-year-old male with shortness of breath for 2
weeks. Left knee infection. Chronic heart valve replacement.

EXAM:
PORTABLE CHEST 1 VIEW

[chest ap]
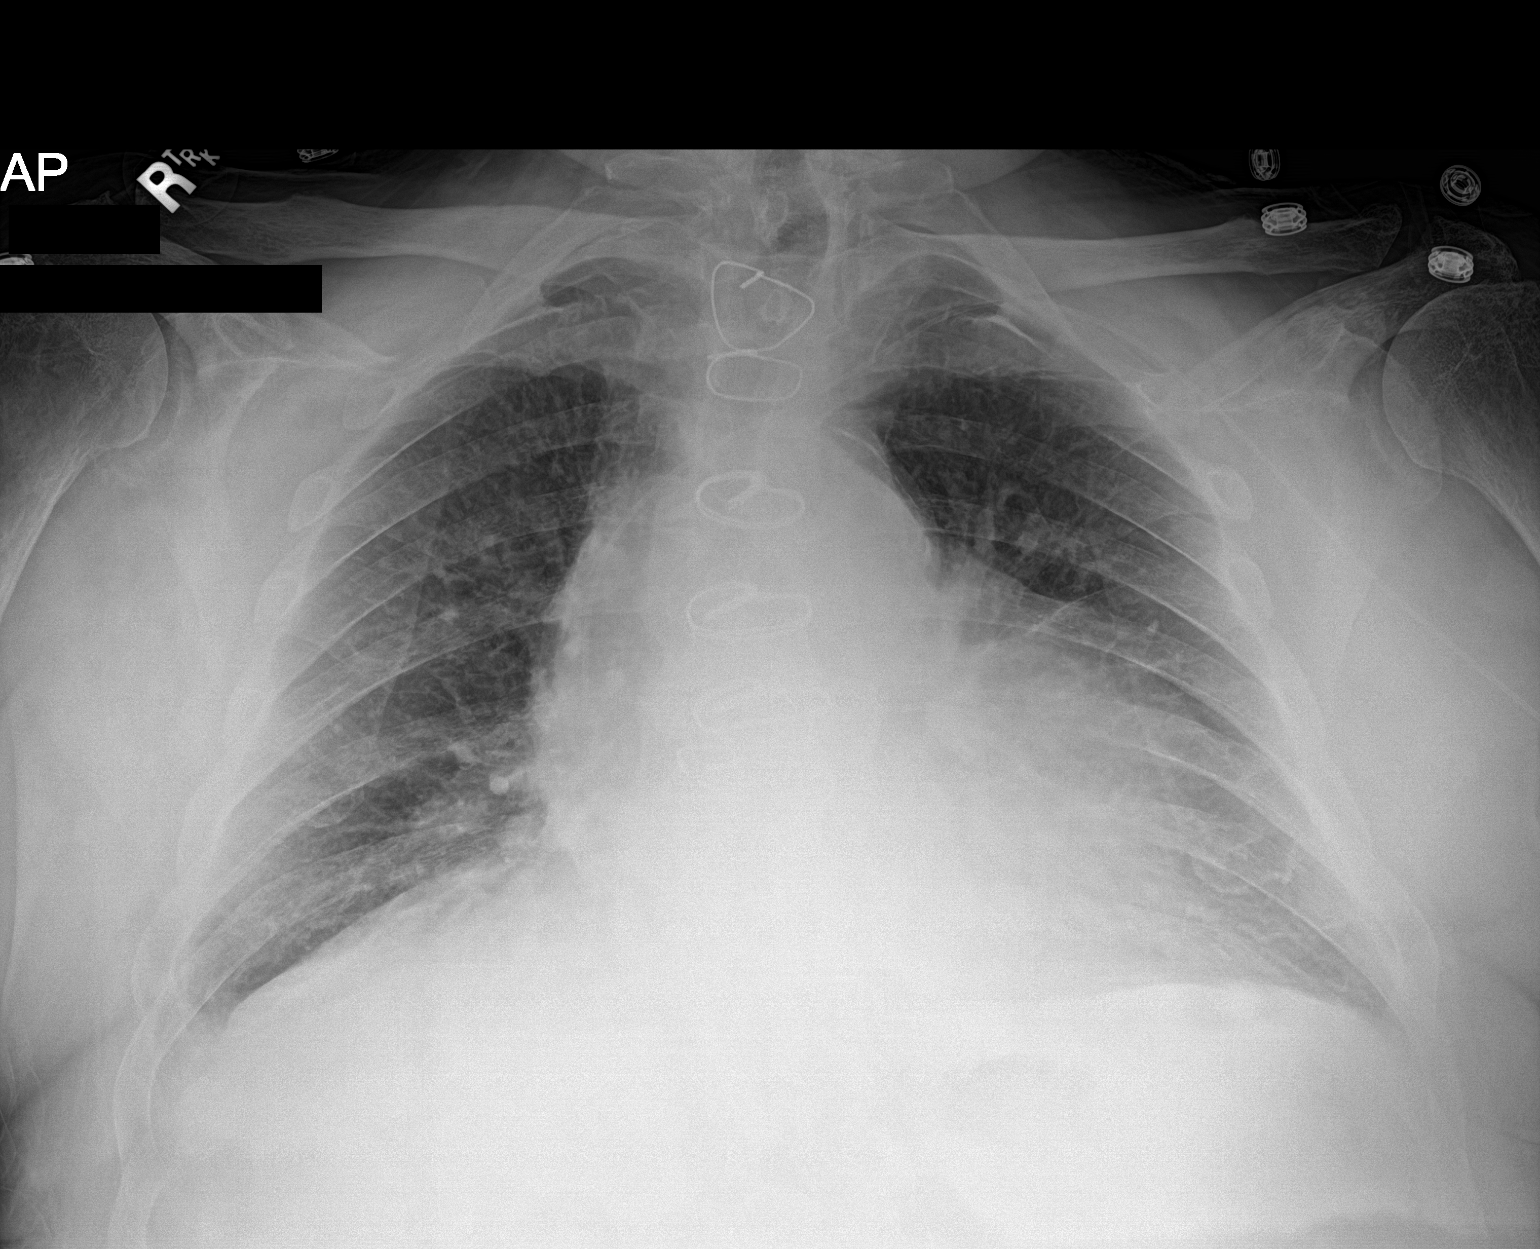

[1 of 1 positions shown; findings below may reference images not displayed]

FINDINGS: Portable AP semi upright view at 4045 hours. Stable left upper
extremity approach PICC line. Stable cardiac size and mediastinal
contours. Prior sternotomy. Visualized tracheal air column is within
normal limits. Stable to mildly decreased pulmonary vascularity from
the portable film yesterday. Otherwise when allowing for portable
technique the lungs are clear. Paucity of bowel gas in the upper
abdomen.
IMPRESSION: No acute cardiopulmonary abnormality.

## 2022-05-18 ENCOUNTER — Telehealth: Payer: Self-pay | Admitting: Internal Medicine

## 2022-05-18 NOTE — Telephone Encounter (Signed)
Patient is requesting a call back from Dr. Harrington Challenger' nurse.

## 2022-05-18 NOTE — Telephone Encounter (Signed)
Pt returning call

## 2022-05-18 NOTE — Telephone Encounter (Signed)
Patient called seeking clarification when should he schedule his next appointment with Dr. Harrington Challenger. Patient currently is asymptomatic, will forward to MD and nurse.

## 2022-05-18 NOTE — Telephone Encounter (Signed)
Left message for the patient to call the clinic.

## 2022-05-19 ENCOUNTER — Encounter: Payer: Self-pay | Admitting: Internal Medicine

## 2022-05-19 ENCOUNTER — Ambulatory Visit: Payer: Medicare Other | Attending: Internal Medicine

## 2022-05-19 ENCOUNTER — Encounter: Payer: Medicare Other | Attending: Internal Medicine | Admitting: Internal Medicine

## 2022-05-19 VITALS — BP 118/62 | HR 60 | Ht 71.0 in | Wt 202.8 lb

## 2022-05-19 DIAGNOSIS — I350 Nonrheumatic aortic (valve) stenosis: Secondary | ICD-10-CM

## 2022-05-19 DIAGNOSIS — I5031 Acute diastolic (congestive) heart failure: Secondary | ICD-10-CM

## 2022-05-19 DIAGNOSIS — I5042 Chronic combined systolic (congestive) and diastolic (congestive) heart failure: Secondary | ICD-10-CM

## 2022-05-19 DIAGNOSIS — I2510 Atherosclerotic heart disease of native coronary artery without angina pectoris: Secondary | ICD-10-CM

## 2022-05-19 DIAGNOSIS — R2689 Other abnormalities of gait and mobility: Secondary | ICD-10-CM

## 2022-05-19 DIAGNOSIS — Z954 Presence of other heart-valve replacement: Secondary | ICD-10-CM

## 2022-05-19 DIAGNOSIS — I10 Essential (primary) hypertension: Secondary | ICD-10-CM

## 2022-05-19 DIAGNOSIS — I493 Ventricular premature depolarization: Secondary | ICD-10-CM | POA: Insufficient documentation

## 2022-05-19 DIAGNOSIS — Z79899 Other long term (current) drug therapy: Secondary | ICD-10-CM | POA: Diagnosis not present

## 2022-05-19 LAB — LIPID PANEL

## 2022-05-19 NOTE — Telephone Encounter (Signed)
Pt to see Dr Harrington Challenger today 05/19/22.

## 2022-05-19 NOTE — Progress Notes (Unsigned)
ZIO XT serial # [id number] from office inventory applied to patient.

## 2022-05-19 NOTE — Patient Instructions (Signed)
Medication Instructions:   *If you need a refill on your cardiac medications before your next appointment, please call your pharmacy*   Lab Work: CMET, CBC, LIPID, TSH TODAY   If you have labs (blood work) drawn today and your tests are completely normal, you will receive your results only by: [place] (if you have MyChart) OR A paper copy in the mail If you have any lab test that is abnormal or we need to change your treatment, we will call you to review the results.   Testing/Procedures: [place]- Long Term Monitor Instructions  Your physician has requested you wear a ZIO patch monitor for 3 days.  This is a single patch monitor. [employer] supplies one patch monitor per enrollment. Additional stickers are not available. Please do not apply patch if you will be having a Nuclear Stress Test,  Echocardiogram, Cardiac CT, MRI, or Chest Xray during the period you would be wearing the  monitor. The patch cannot be worn during these tests. You cannot remove and re-apply the  ZIO XT patch monitor.  Your ZIO patch monitor will be mailed 3 day USPS to your address on file. It may take 3-5 days  to receive your monitor after you have been enrolled.  Once you have received your monitor, please review the enclosed instructions. Your monitor  has already been registered assigning a specific monitor serial # to you.  Billing and Patient Assistance Program Information  We have supplied [employer] with any of your insurance information on file for billing purposes. [employer] offers a sliding scale Patient Assistance Program for patients that do not have  insurance, or whose insurance does not completely cover the cost of the ZIO monitor.  You must apply for the Patient Assistance Program to qualify for this discounted rate.  To apply, please call [employer] at [phone], select option 4, select option 2, ask to apply for  Patient Assistance Program. Theodore Demark will ask your household income, and how  many people  are in your household. They will quote your out-of-pocket cost based on that information.  [employer] will also be able to set up a 23-month, interest-free payment plan if needed.  Applying the monitor   Shave hair from upper left chest.  Hold abrader disc by orange tab. Rub abrader in 40 strokes over the upper left chest as  indicated in your monitor instructions.  Clean area with 4 enclosed alcohol pads. Let dry.  Apply patch as indicated in monitor instructions. Patch will be placed under collarbone on left  side of chest with arrow pointing upward.  Rub patch adhesive wings for 2 minutes. Remove white label marked "1". Remove the white  label marked "2". Rub patch adhesive wings for 2 additional minutes.  While looking in a mirror, press and release button in center of patch. A small green light will  flash 3-4 times. This will be your only indicator that the monitor has been turned on.  Do not shower for the first 24 hours. You may shower after the first 24 hours.  Press the button if you feel a symptom. You will hear a small click. Record Date, Time and  Symptom in the Patient Logbook.  When you are ready to remove the patch, follow instructions on the last 2 pages of Patient  Logbook. Stick patch monitor onto the last page of Patient Logbook.  Place Patient Logbook in the blue and white box. Use locking tab on box and tape box closed  securely. The  blue and white box has prepaid postage on it. Please place it in the mailbox as  soon as possible. Your physician should have your test results approximately 7 days after the  monitor has been mailed back to [facility].  Call [place] at [phone] if you have questions regarding  your ZIO XT patch monitor. Call them immediately if you see an orange light blinking on your  monitor.  If your monitor falls off in less than 4 days, contact our Monitor department at [phone].  If your monitor becomes  loose or falls off after 4 days call [employer] at [phone] for  suggestions on securing your monitor    Follow-Up: At [facility], you and your health needs are our priority.  As part of our continuing mission to provide you with exceptional heart care, we have created designated Provider Care Teams.  These Care Teams include your primary Cardiologist (physician) and Advanced Practice Providers (APPs -  Physician Assistants and Nurse Practitioners) who all work together to provide you with the care you need, when you need it.  We recommend signing up for the patient portal called "MyChart".  Sign up information is provided on this After Visit Summary.  MyChart is used to connect with patients for Virtual Visits (Telemedicine).  Patients are able to view lab/test results, encounter notes, upcoming appointments, etc.  Non-urgent messages can be sent to your provider as well.   To learn more about what you can do with MyChart, go to [email or web address].     REFERRAL TO [facility]ANCE PT

## 2022-05-19 NOTE — Progress Notes (Signed)
Cardiology Office Note   Date:  05/19/2022   ID:  Luis Herrera, DOB 1940-07-13, MRN [id number]  PCP:  [name], MD  Cardiologist:   [name], MD   Pt presents for follow up of CAD         History of Present Illness: Luis Herrera is a 82 y.o. 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  Dimensionless index was 0.23.  The pt went on to have evaluation by structural heart team.   Cardiac cath showed paent graft to PDA and minimal dz of L circulation.    Cardiac index 4 L/min.   Pulse pressure of nearly 100 mm Hg with LVEDP of 24 to 28 .  Pt admitted for stabilization.  On 09/30/21 he underwent TAVR (23 mm Sapien 3 Ultra Resilia valve)  Repeat echo in Sept 2023 showed increased mean gradient of 28 mm Hg (from 18 mm Hg)    Cardiac CT done showing no obstruction. The pt hs been followed for BP control with pharmacy  The pt says he has been doing OK   BP is OK at home  120s/  Breathing is OK  No CP    Denies palpitations   He does not some unsteadiness   [place] coming in to appt     Current Meds  Medication Sig   amiodarone (PACERONE) 200 MG tablet TAKE ONE-HALF TABLET BY MOUTH  DAILY   amLODipine (NORVASC) 5 MG tablet TAKE 1 TABLET (5 MG TOTAL) BY MOUTH DAILY.   aspirin EC 81 MG tablet Take 1 tablet (81 mg total) by mouth daily. Swallow whole.   atorvastatin (LIPITOR) 80 MG tablet TAKE 1 TABLET BY MOUTH ONCE  DAILY   Calcium Carb-Cholecalciferol (CALCIUM PLUS VITAMIN D3 PO) Take 2 tablets by mouth daily. Gummy supplements   carvedilol (COREG) 6.25 MG tablet TAKE 1 TABLET BY MOUTH TWICE  DAILY   ezetimibe (ZETIA) 10 MG tablet Take 1 tablet (10 mg total) by mouth daily.    furosemide (LASIX) 20 MG tablet Take 1 tablet (20 mg total) by mouth daily.   levothyroxine (SYNTHROID) 50 MCG tablet TAKE 1 TABLET BY MOUTH DAILY  BEFORE BREAKFAST   Multiple Vitamins-Minerals (PRESERVISION AREDS 2) CAPS Take 1 capsule by mouth 2 (two) times daily.   nitrofurantoin, macrocrystal-monohydrate, (MACROBID) 100 MG capsule Take 100 mg by mouth at bedtime.   sacubitril-valsartan (ENTRESTO) 24-26 MG Take 1 tablet by mouth 2 (two) times daily.   tamsulosin (FLOMAX) 0.4 MG CAPS capsule Take 0.4 mg by mouth in the morning and at bedtime.     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 Ali Lowe and Dr. Cyndia Bent   Thyroid disease     Past Surgical History:  Procedure Laterality Date   AORTIC VALVE REPLACEMENT  2009   Bioprosthetic at [place]  2008   [place] GRAFT  2009   SVG-PDA w/ AVR   CORRECTION HAMMER TOE  2011   BILATERAL   GASTROCNEMIUS RECESSION  05/28/2011   INTRAOPERATIVE TRANSTHORACIC ECHOCARDIOGRAM N/A 09/30/2021   Procedure: INTRAOPERATIVE TRANSTHORACIC ECHOCARDIOGRAM;  Surgeon: Early Osmond, MD;  Location: [facility];  Service: Open Heart Surgery;  Laterality: N/A;   POLYPECTOMY     RIGHT/LEFT HEART CATH AND CORONARY/GRAFT ANGIOGRAPHY N/A 09/24/2021   Procedure: RIGHT/LEFT HEART CATH AND CORONARY/GRAFT ANGIOGRAPHY;  Surgeon: Early Osmond, MD;  Location: [facility];  Service: Cardiovascular;  Laterality: N/A;   TONSILLECTOMY     TOTAL HIP ARTHROPLASTY Left 2000   TOTAL KNEE ARTHROPLASTY  2000   left   TOTAL KNEE ARTHROPLASTY  12/01/2011   Procedure: TOTAL KNEE ARTHROPLASTY;  Surgeon: Mauri Pole, MD;  Location: [facility];  Service: Orthopedics;  Laterality: Right;   TRANSCATHETER AORTIC VALVE REPLACEMENT, TRANSFEMORAL Right 09/30/2021   Procedure: Transcatheter Aortic Valve Replacement, Transfemoral;  Surgeon: Early Osmond, MD;  Location: [facility];  Service: Open Heart Surgery;  Laterality: Right;   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 His smoking use included cigarettes. He has never used smokeless tobacco. He reports that he does not currently use alcohol. He reports that he does not use drugs.   Family History:  The patient's family history includes Colon cancer (age of onset: 33) in his mother; Heart attack in his brother and father; Hyperlipidemia in an other family member; Hypertension in his brother, sister, and another family member.    ROS:  Please see the history of present illness. All other systems are reviewed and  Negative to the above problem except as noted.    PHYSICAL EXAM: VS:  BP 118/62   Pulse 60   Ht 5\' 11"  (1.803 m)   Wt 202 lb 12.8 oz (92 kg)   SpO2 98%   BMI 28.28 kg/m   GEN: Obese 82 year-old in no acute distress  HEENT: normal  Neck: JVP is normal   Cardiac: RRR; Gr II/VI systolic murmur base  Triv LE edema  Respiratory:  clear to auscultation GI: soft, nontender, nondistended, No hepatomegaly    EKG:  EKG not done      [facility]    01/09/20  Study Highlights  1. NSR with sinus brady and sinus tachycardia 2. Frequent PVC's and PAC's 3. NS AT 4. Rare triplets 5. Bigeminal and trigeminal PVC's   Echo   01/09/20  1. Left ventricular ejection fraction, by estimation, is 55 to 60%. The left ventricle has normal function. The left ventricle demonstrates regional wall motion abnormalities (see scoring diagram/findings  for description). The left ventricular internal cavity size was mildly dilated. Left ventricular diastolic parameters are consistent with Grade II diastolic dysfunction (pseudonormalization). 2. Right ventricular systolic function is mildly reduced. The right ventricular size is mildly enlarged. There is normal pulmonary artery systolic pressure. The estimated right ventricular systolic pressure is [id number] mmHg. 3. The mitral valve is degenerative. Mild mitral valve regurgitation. The mean mitral valve gradient is 5.0 mmHg with average heart rate of 61 bpm in the setting of mild-moderate MAC. 4. Aortic valve regurgitation is mild. There is a unknown size bioprosthetic valve present in the aortic position, LVOT diameter is 2.6 cm. Procedure Date: 2009. Aortic valve mean gradient measures 18.0 mmHg. Grossly stable appearance of aortic valve prosthesis compared to 04/13/2018 exam. See findings for calculations. 5. Aortic dilatation noted. There is mild dilatation of the ascending aorta, measuring 42 mm. 6. The inferior vena cava is dilated in size with >50% respiratory variability, suggesting right atrial pressure of 8 mmHg. Comparison(s): 09/13/19 EF 60-65%. AV 56mmHg mean PG. 04/13/2018 EF 60-65%. AV 70mmHg mean PG.  Echo 7.28.21  1. Left ventricular ejection fraction, by estimation, is 60 to 65%. The left ventricle has normal function. There is moderate left ventricular hypertrophy. 2. Right ventricular systolic function is normal. The right ventricular size is normal. There is moderately  elevated pulmonary artery systolic pressure. 3. Mild mitral valve regurgitation. 4. Bioprosthetic aortic valve leaflets not well-visualized but appear thickened. The mean gradient has increased from 14.8 mmHg 03/2018 to 32.7 mmHg now. The aortic valve has been repaired/replaced. Aortic valve regurgitation is mild. Moderate aortic valve stenosis. There is a bioprosthetic valve present in the aortic position. Aortic valve area, by VTI measures 1.21 cm. Aortic valve mean gradient measures 32.7 mmHg. Aortic valve Vmax measures 3.96 m/s. 5. Aortic dilatation noted. There is mild dilatation of the ascending aorta. 6. The inferior vena cava is dilated in size with >50% respiratory variability, suggesting right atrial pressure of 8 mmHg. Lipid Panel    Component Value Date/Time   CHOL 150 12/18/2019 1005   TRIG 96 12/18/2019 1005   HDL 54 12/18/2019 1005   CHOLHDL 2.8 12/18/2019 1005   CHOLHDL 3 11/14/2015 0954   VLDL 16.8 11/14/2015 0954   LDLCALC 78 12/18/2019 1005      Wt Readings from Last 3 Encounters:  05/19/22 202 lb 12.8 oz (92 kg)  03/17/22 205 lb (93 kg)  02/11/22 206 lb 6.4 oz (93.6 kg)      ASSESSMENT AND PLAN:  1  AV dz   S/p TAVR in Aug 2023  Last echo showed elevated gradient but CT confirmed no obstruction  Will need to be followed  2  CAD   Cath prior to procedure showed patient graft to RCA   No signifciant dz     3  HTN  Good control on current regimen  4  HL  Last LDL 72  HDL 58  Keep on lipitor and Zetia  5  CAD   Mild dz of carotids  6  Hx of PVCs  Follows in EP  Takes 1000 mg M- F  and 200 on weekends   Will review with EP   .  [place] today on way into clinic    Will refer to balance rehab    Current medicines are reviewed at length with the patient today.  The patient does not have concerns regarding medicines.  Signed, [name], MD  05/19/2022 12:01 PM  [facility] [place], [place], [place]   [address] Phone: [phone]; Fax: [phone]

## 2022-05-19 NOTE — Telephone Encounter (Signed)
Spoke with the pt and he asked if Dr Harrington Challenger can see his son in law he is not sure of his problems so I will call his daughter per his request to make an appt.   [name] Neylor [phone].

## 2022-05-20 LAB — COMPREHENSIVE METABOLIC PANEL
ALT: 22 IU/L (ref 0–44)
AST: 35 IU/L (ref 0–40)
Albumin/Globulin Ratio: 1.4 (ref 1.2–2.2)
Albumin: 4.1 g/dL (ref 3.7–4.7)
Alkaline Phosphatase: 112 IU/L (ref 44–121)
BUN/Creatinine Ratio: 17 (ref 10–24)
BUN: 19 mg/dL (ref 8–27)
Bilirubin Total: 1.3 mg/dL — ABNORMAL HIGH (ref 0.0–1.2)
CO2: 22 mmol/L (ref 20–29)
Calcium: 8.9 mg/dL (ref 8.6–10.2)
Chloride: 107 mmol/L — ABNORMAL HIGH (ref 96–106)
Creatinine, Ser: 1.09 mg/dL (ref 0.76–1.27)
Globulin, Total: 3 g/dL (ref 1.5–4.5)
Glucose: 93 mg/dL (ref 70–99)
Potassium: 4.5 mmol/L (ref 3.5–5.2)
Sodium: 143 mmol/L (ref 134–144)
Total Protein: 7.1 g/dL (ref 6.0–8.5)
eGFR: 68 mL/min/{1.73_m2} (ref >59)

## 2022-05-20 LAB — CBC
Hematocrit: 29.9 % — ABNORMAL LOW (ref 37.5–51.0)
Hemoglobin: 9.9 g/dL — ABNORMAL LOW (ref 13.0–17.7)
MCH: 30.7 pg (ref 26.6–33.0)
MCHC: 33.1 g/dL (ref 31.5–35.7)
MCV: 93 fL (ref 79–97)
Platelets: 267 10*3/uL (ref 150–450)
RBC: 3.23 x10E6/uL — ABNORMAL LOW (ref 4.14–5.80)
RDW: 14.3 % (ref 11.6–15.4)
WBC: 8.2 10*3/uL (ref 3.4–10.8)

## 2022-05-20 LAB — TSH: TSH: 3.77 u[IU]/mL (ref 0.450–4.500)

## 2022-05-20 LAB — LIPID PANEL
Chol/HDL Ratio: 3 ratio (ref 0.0–5.0)
Cholesterol, Total: 158 mg/dL (ref 100–199)
HDL: 52 mg/dL (ref >39)
LDL Chol Calc (NIH): 89 mg/dL (ref 0–99)
Triglycerides: 91 mg/dL (ref 0–149)
VLDL Cholesterol Cal: 17 mg/dL (ref 5–40)

## 2022-05-26 ENCOUNTER — Ambulatory Visit: Payer: Medicare Other | Attending: Internal Medicine | Admitting: Physical Therapy

## 2022-05-26 ENCOUNTER — Encounter: Payer: Self-pay | Admitting: Physical Therapy

## 2022-05-26 VITALS — BP 121/45 | HR 57

## 2022-05-26 DIAGNOSIS — R2689 Other abnormalities of gait and mobility: Secondary | ICD-10-CM | POA: Insufficient documentation

## 2022-05-26 DIAGNOSIS — I493 Ventricular premature depolarization: Secondary | ICD-10-CM | POA: Insufficient documentation

## 2022-05-26 DIAGNOSIS — I10 Essential (primary) hypertension: Secondary | ICD-10-CM | POA: Insufficient documentation

## 2022-05-26 DIAGNOSIS — Z79899 Other long term (current) drug therapy: Secondary | ICD-10-CM | POA: Diagnosis not present

## 2022-05-26 DIAGNOSIS — I2510 Atherosclerotic heart disease of native coronary artery without angina pectoris: Secondary | ICD-10-CM | POA: Insufficient documentation

## 2022-05-26 DIAGNOSIS — R269 Unspecified abnormalities of gait and mobility: Secondary | ICD-10-CM | POA: Diagnosis not present

## 2022-05-26 DIAGNOSIS — I350 Nonrheumatic aortic (valve) stenosis: Secondary | ICD-10-CM | POA: Insufficient documentation

## 2022-05-26 DIAGNOSIS — I5042 Chronic combined systolic (congestive) and diastolic (congestive) heart failure: Secondary | ICD-10-CM | POA: Insufficient documentation

## 2022-05-26 DIAGNOSIS — I5031 Acute diastolic (congestive) heart failure: Secondary | ICD-10-CM | POA: Diagnosis not present

## 2022-05-26 DIAGNOSIS — R2681 Unsteadiness on feet: Secondary | ICD-10-CM | POA: Diagnosis not present

## 2022-05-26 NOTE — Therapy (Signed)
OUTPATIENT PHYSICAL THERAPY NEURO EVALUATION   Patient Name: Luis Herrera MRN: [id number] DOB:03/05/1940, 82 y.o., male Today's Date: 05/26/2022   PCP: Abbe AmsterdamShannon Banks, MD REFERRING PROVIDER: Pricilla Riffleoss, Paula V, MD  END OF SESSION:  PT End of Session - 05/26/22 1354     Visit Number 1    Authorization Type [employer] Medicare    PT Start Time 1353    Equipment Utilized During Treatment Gait belt    Activity Tolerance Patient tolerated treatment well    Behavior [facility] for tasks assessed/performed             Past Medical History:  Diagnosis Date   Aortic stenosis 2008   Arthritis    Blood transfusion    Blood transfusion without reported diagnosis    CAD (coronary artery disease) 2008   Cataract    Complication of anesthesia    hallucinated once   H/O hiatal hernia    Heart murmur    AFTER REPLACED VALVE WENT AWAY   Hx of colonic polyps    Hyperlipidemia    Hypertension    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 Lynnette Caffeyhukkani and Dr. Laneta SimmersBartle   Thyroid disease    Past Surgical History:  Procedure Laterality Date   AORTIC VALVE REPLACEMENT  2009   [facility]   CARDIAC CATHETERIZATION  2008   CHOLECYSTECTOMY  1982   COLONOSCOPY     CORONARY ARTERY BYPASS GRAFT  2009   SVG-PDA w/ AVR   CORRECTION HAMMER TOE  2011   BILATERAL   GASTROCNEMIUS RECESSION  05/28/2011   INTRAOPERATIVE TRANSTHORACIC ECHOCARDIOGRAM N/A 09/30/2021   Procedure: INTRAOPERATIVE TRANSTHORACIC ECHOCARDIOGRAM;  Surgeon: Orbie Pyohukkani, Arun K, MD;  [facility];  Service: Open Heart Surgery;  Laterality: N/A;   POLYPECTOMY     RIGHT/LEFT HEART CATH AND CORONARY/GRAFT ANGIOGRAPHY N/A 09/24/2021   Procedure: RIGHT/LEFT HEART CATH AND CORONARY/GRAFT ANGIOGRAPHY;  Surgeon: Orbie Pyohukkani, Arun K, MD;  [facility]: [facility];  Service: Cardiovascular;  Laterality: N/A;    TONSILLECTOMY     TOTAL HIP ARTHROPLASTY Left 2000   TOTAL KNEE ARTHROPLASTY  2000   left   TOTAL KNEE ARTHROPLASTY  12/01/2011   Procedure: TOTAL KNEE ARTHROPLASTY;  Surgeon: Shelda PalMatthew D Olin, MD;  Location: [facility];  Service: Orthopedics;  Laterality: Right;   TRANSCATHETER AORTIC VALVE REPLACEMENT, TRANSFEMORAL Right 09/30/2021   Procedure: Transcatheter Aortic Valve Replacement, Transfemoral;  Surgeon: Orbie Pyohukkani, Arun K, MD;  [facility];  Service: Open Heart Surgery;  Laterality: Right;   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   Procedure: TRANSURETHRAL RESECTION OF BLADDER TUMOR (TURBT) BILATERAL RETROGRADE PYELOGRAM;  Surgeon: Crista ElliotBell, Eugene D III, MD;  Location: [facility];  Service: Urology;  Laterality: Bilateral;  45 MINS FOR CASE   WEIL OSTEOTOMY  05/28/2011   Patient Active Problem List   Diagnosis Date Noted   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   Acute diastolic heart failure 09/19/2021   Severe aortic stenosis 09/18/2021   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, LEFT  10/17/2019   Anemia 09/12/2019   Frequent UTI 04/13/2018   Hypothyroidism 04/13/2018   Right bundle branch block 09/27/2017   CAD (coronary artery disease) 09/27/2017   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/2017   Acquired claw toe of right foot 06/04/2016   Class  1 obesity with body mass index (BMI) of 31.0 to 31.9 in adult 12/02/2011   Back pain of thoracolumbar region 09/01/2010   Hyperlipidemia with target LDL less than 130 04/10/2010   Essential hypertension 04/10/2010   Aortic valve replaced 04/10/2010    ONSET DATE: 05/19/2022 (referral date)  REFERRING DIAG: I10 (ICD-10-CM) - Essential hypertension I10 (ICD-10-CM) - Hypertension, unspecified type I49.3 (ICD-10-CM) - Frequent PVCs I25.10 (ICD-10-CM) - Coronary artery disease, unspecified vessel or lesion type, unspecified whether angina  present, unspecified whether native or transplanted heart I50.42 (ICD-10-CM) - Chronic combined systolic and diastolic heart failure I35.0 (ZOX-09-UE) - Nonrheumatic aortic valve stenosis I25.10 (ICD-10-CM) - Coronary artery disease involving native coronary artery of native heart without angina pectoris I50.31 (ICD-10-CM) - Acute diastolic heart failure Z79.899 (ICD-10-CM) - Medication management R26.89 (ICD-10-CM) - Imbalance  THERAPY DIAG:  Abnormality of gait and mobility  Rationale for Evaluation and Treatment: Rehabilitation  SUBJECTIVE:                                                                                                                                                                                             SUBJECTIVE STATEMENT: Patient reports that he was referred to physical therapy due to having a fall when walking into Dr. Tenny Craw, cardiologist, office. Patient reports tripping on curb. He was unable to get up by himself. A stranger driving by noticed and helped pick him up. Patient reports that he has a history of dragging his feet and needs to work more on picking up his feet. Patient reports that if he doesn't wear sunglasses he get a glare in his eyes that bothers him. Patient reports one episode of what appears to be orthostatic hypotension or drop in BP when he was standing; patient reports that his BP tends to run very low. Patient reports that when he gets up too fast he feels a little "wobbly" and "lightheaded." Patient reports being fully independent but over the last 6 months has started noticing lightheadedness and imbalance.   Pt accompanied by: self  PERTINENT HISTORY: CAD (s/p CABG with SVG to PDA, 2009), AV dz (s/p AVR in 2009), HTN, diastolic dysfunction and PVCs, reports most recent heart surgery in August 2023  PAIN:  Are you having pain? No  PRECAUTIONS: Fall  WEIGHT BEARING RESTRICTIONS: No  FALLS: Has patient fallen in last 6 months? Yes. Number of  falls 1 fall into hospital and 1 time near fall when in church and someone had to catch him due to feeling lightheaded  LIVING ENVIRONMENT: Lives with: lives with their spouse Lives in: House/apartment - townhouse Stairs: Yes: Internal: 12  steps; on left going up and External: 8 steps; on right going up Has following equipment at home: Single point cane, Walker - 2 wheeled, and Grab bars  PLOF: Independent  PATIENT GOALS: "I guess my balance."  OBJECTIVE:   DIAGNOSTIC FINDINGS:   CT Angio Chest Aorta 09/19/2021:  IMPRESSION: 1. Vascular findings and measurements pertinent to potential TAVR procedure, as detailed above. 2. Prior aortic valve replacement. 3. Severe aortoiliac atherosclerosis. 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 4. Left main and 3 vessel coronary artery disease. 5. Pulmonary edema and small bilateral pleural effusions.  COGNITION: Overall cognitive status: Within functional limits for tasks assessed   SENSATION: WFL  COORDINATION: WFL  EDEMA:  Wears compression socks to control bilateral LE edema but is well controlled now  MUSCLE TONE: WNL  POSTURE: rounded shoulders and forward head  LOWER EXTREMITY ROM:     Mild restrictions in hip flexors and extensors and gastrocs bilaterally  LOWER EXTREMITY MMT:    MMT Right Eval Left Eval  Hip flexion 4-/5 4-/5  Hip extension    Hip abduction 4-/5 4-/5  Hip adduction 4-/5 4-/5  Hip internal rotation    Hip external rotation    Knee flexion 4-/5 4-/5  Knee extension 4-/5 4-/5  Ankle dorsiflexion 4-/5 4-/5  Ankle plantarflexion    Ankle inversion    Ankle eversion    (Blank rows = not tested)  TRANSFERS: Assistive device utilized: None  Sit to stand: Modified independence Stand to sit: Modified independence Chair to chair: Modified independence Requires use of hands to come to stand  GAIT: Gait  pattern: decreased hip/knee flexion- Right, decreased hip/knee flexion- Left, decreased ankle dorsiflexion- Right, decreased ankle dorsiflexion- Left, wide BOS, poor foot clearance- Right, and poor foot clearance- Left Distance walked: See Assistive device utilized: None Level of assistance: SBA Comments: Increased forefoot contact with fatigue, able to self correct as able  FUNCTIONAL TESTS:  5 times sit to stand: 14.32" with bilateral UE use (SBA) 6 minute walk test: 968 feet with SBA (one trip on foot at end) 10 meter walk test: 0.79 m/s with 12.59" with SBA MCTSIB:  Condition 1: 30" no sway Condition 2: 30" mod sway Condition 3: 30" mod sway Condition 4: 28" max sway  TODAY'S TREATMENT:                                                                                                                               Initial Eval only -  To be provided   PATIENT EDUCATION: Education details: POC, examination findings, results, goal collaboration Person educated: Patient Education method: Explanation Education comprehension: verbalized understanding  HOME EXERCISE PROGRAM: To be provided  GOALS: Goals reviewed with patient? Yes  LONG TERM GOALS: Target date:  STG = LTG (due to POC length)  Patient will report demonstrate independence with final HEP in order to maintain  current gains and continue to progress after physical therapy discharge.  Baseline: To be provided Goal status: INITIAL  2.  Patient will improve mCTSIB score to 120/120 to indicate improved integration of vestibular, proprioceptive, and visual balance systems during static balance tasks in order to reduce risk for falls.   Baseline: 118/120 Goal status: INITIAL  3.  Patient will improve score by 50 meters to demonstrate a clinically meaningful improvement in aerobic capacity and endurance needed for ambulating in the community.   Baseline: 968 feet with SBA (one trip on foot at end) Goal status:  INITIAL  4.  Patient will improve gait speed to 0.89 m/s to indicate improvement to the level of community ambulator in order to participate more easily in activities outside of the home.   Baseline: 0.79 m/s with 12.59" with SBA Goal status: INITIAL  5.  miniBest to be assessed/goal written as indicated Baseline: To be assessed Goal status: INITIAL  ASSESSMENT:  CLINICAL IMPRESSION: Patient is a 82 y.o. male who was seen today for physical therapy evaluation and treatment for imbalance and deconditioning patient has PMH of CAD (s/p CABG with SVG to PDA, 2009), AV dz (s/p AVR in 2009), HTN, diastolic dysfunction and PVCs, reports most recent heart surgery in August 2023. Patient is at an increased risk for falls as indicated by gait speed. Patient also demonstrates decreased cardiorespiratory norms as indicated by , ambulating well below age matched norms. Patient also demonstrates decreased vestibular integration as indicated by mCTSIB. Patient will benefit from skilled physical therapy services in order to maximize function and progress towards PLOF.   OBJECTIVE IMPAIRMENTS: Abnormal gait, cardiopulmonary status limiting activity, decreased activity tolerance, decreased balance, decreased endurance, decreased mobility, difficulty walking, decreased ROM, decreased strength, and improper body mechanics.   ACTIVITY LIMITATIONS: carrying, lifting, sitting, standing, squatting, stairs, transfers, and bed mobility  PARTICIPATION LIMITATIONS: community activity and yard work  PERSONAL FACTORS: Age, Fitness, and 3+ comorbidities: see above  are also affecting patient's functional outcome.   REHAB POTENTIAL: Good  CLINICAL DECISION MAKING: Stable/uncomplicated  EVALUATION COMPLEXITY: Low  PLAN:  PT FREQUENCY: 1x/week  PT DURATION: 6 weeks  PLANNED INTERVENTIONS: Therapeutic exercises, Therapeutic activity, Neuromuscular re-education, Balance training, Gait training, Patient/Family  education, Self Care, Joint mobilization, Manual therapy, and Re-evaluation  PLAN FOR NEXT SESSION: miniBest assess/write goals as indicated, generalized strengthening/conditioning, work on endurance/gait  Carmelia Bake, PT, DPT 05/26/2022, 1:57 PM

## 2022-05-27 DIAGNOSIS — I493 Ventricular premature depolarization: Secondary | ICD-10-CM | POA: Diagnosis not present

## 2022-05-27 DIAGNOSIS — I10 Essential (primary) hypertension: Secondary | ICD-10-CM | POA: Diagnosis not present

## 2022-05-28 ENCOUNTER — Encounter: Payer: Self-pay | Admitting: Internal Medicine

## 2022-05-29 ENCOUNTER — Encounter: Payer: Self-pay | Admitting: Internal Medicine

## 2022-06-02 ENCOUNTER — Ambulatory Visit: Payer: Medicare Other | Admitting: Physical Therapy

## 2022-06-02 ENCOUNTER — Encounter: Payer: Self-pay | Admitting: Physical Therapy

## 2022-06-02 VITALS — BP 143/56 | HR 56

## 2022-06-02 DIAGNOSIS — I350 Nonrheumatic aortic (valve) stenosis: Secondary | ICD-10-CM | POA: Diagnosis not present

## 2022-06-02 DIAGNOSIS — R269 Unspecified abnormalities of gait and mobility: Secondary | ICD-10-CM

## 2022-06-02 DIAGNOSIS — I2510 Atherosclerotic heart disease of native coronary artery without angina pectoris: Secondary | ICD-10-CM | POA: Diagnosis not present

## 2022-06-02 DIAGNOSIS — R2681 Unsteadiness on feet: Secondary | ICD-10-CM

## 2022-06-02 DIAGNOSIS — Z79899 Other long term (current) drug therapy: Secondary | ICD-10-CM | POA: Diagnosis not present

## 2022-06-02 DIAGNOSIS — I493 Ventricular premature depolarization: Secondary | ICD-10-CM | POA: Diagnosis not present

## 2022-06-02 DIAGNOSIS — I5042 Chronic combined systolic (congestive) and diastolic (congestive) heart failure: Secondary | ICD-10-CM | POA: Diagnosis not present

## 2022-06-02 DIAGNOSIS — R2689 Other abnormalities of gait and mobility: Secondary | ICD-10-CM | POA: Diagnosis not present

## 2022-06-02 DIAGNOSIS — I10 Essential (primary) hypertension: Secondary | ICD-10-CM | POA: Diagnosis not present

## 2022-06-02 DIAGNOSIS — I5031 Acute diastolic (congestive) heart failure: Secondary | ICD-10-CM | POA: Diagnosis not present

## 2022-06-02 NOTE — Therapy (Addendum)
OUTPATIENT PHYSICAL THERAPY NEURO TREATMENT   Patient Name: Luis Herrera MRN: [id number] DOB:12-Jan-1941, 82 y.o., male Today's Date: 06/02/2022   PCP: Abbe Amsterdam, MD REFERRING PROVIDER: Pricilla Riffle, MD  END OF SESSION:  PT End of Session - 06/02/22 1234     Visit Number 2    Number of Visits 7    Date for PT Re-Evaluation 05/26/22    Authorization Type [employer]    PT Start Time 1232    PT Stop Time 1316    PT Time Calculation (min) 44 min    Equipment Utilized During Treatment Gait belt    Activity Tolerance Patient tolerated treatment well    Behavior During Therapy WFL for tasks assessed/performed             Past Medical History:  Diagnosis Date   Aortic stenosis 2008   Arthritis    Blood transfusion    Blood transfusion without reported diagnosis    CAD (coronary artery disease) 2008   Cataract    Complication of anesthesia    hallucinated once   H/O hiatal hernia    Heart murmur    AFTER REPLACED VALVE WENT AWAY   Hx of colonic polyps    Hyperlipidemia    Hypertension    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 Lynnette Caffey and Dr. Laneta Simmers   Thyroid disease    Past Surgical History:  Procedure Laterality Date   AORTIC VALVE REPLACEMENT  2009   [facility]oprosthetic at [place] [facility]   CARDIAC CATHETERIZATION  2008   CHOLECYSTECTOMY  1982   COLONOSCOPY     CORONARY ARTERY BYPASS GRAFT  2009   SVG-PDA w/ AVR   CORRECTION HAMMER TOE  2011   BILATERAL   GASTROCNEMIUS RECESSION  05/28/2011   INTRAOPERATIVE TRANSTHORACIC ECHOCARDIOGRAM N/A 09/30/2021   Procedure: INTRAOPERATIVE TRANSTHORACIC ECHOCARDIOGRAM;  Surgeon: Orbie Pyo, MD;  [facility];  Service: Open Heart Surgery;  Laterality: N/A;   POLYPECTOMY     RIGHT/LEFT HEART CATH AND CORONARY/GRAFT ANGIOGRAPHY N/A 09/24/2021   Procedure: RIGHT/LEFT HEART CATH AND CORONARY/GRAFT  ANGIOGRAPHY;  Surgeon: Orbie Pyo, MD;  [facility];  Service: Cardiovascular;  Laterality: N/A;   TONSILLECTOMY     TOTAL HIP ARTHROPLASTY Left 2000   TOTAL KNEE ARTHROPLASTY  2000   left   TOTAL KNEE ARTHROPLASTY  12/01/2011   Procedure: TOTAL KNEE ARTHROPLASTY;  Surgeon: Shelda Pal, MD;  Location: [facility];  Service: Orthopedics;  Laterality: Right;   TRANSCATHETER AORTIC VALVE REPLACEMENT, TRANSFEMORAL Right 09/30/2021   Procedure: Transcatheter Aortic Valve Replacement, Transfemoral;  Surgeon: Orbie Pyo, MD;  [facility];  Service: Open Heart Surgery;  Laterality: Right;   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   Procedure: TRANSURETHRAL RESECTION OF BLADDER TUMOR (TURBT) BILATERAL RETROGRADE PYELOGRAM;  Surgeon: Crista Elliot, MD;  Location: [facility];  Service: Urology;  Laterality: Bilateral;  45 MINS FOR CASE   WEIL OSTEOTOMY  05/28/2011   Patient Active Problem List   Diagnosis Date Noted   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   Acute diastolic heart failure 09/19/2021   Severe aortic stenosis 09/18/2021   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, LEFT  10/17/2019   Anemia 09/12/2019   Frequent UTI 04/13/2018   Hypothyroidism 04/13/2018  Right bundle branch block 09/27/2017   CAD (coronary artery disease) 09/27/2017   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/2017   Acquired claw toe of right foot 06/04/2016   Class 1 obesity with body mass index (BMI) of 31.0 to 31.9 in adult 12/02/2011   Back pain of thoracolumbar region 09/01/2010   Hyperlipidemia with target LDL less than 130 04/10/2010   Essential hypertension 04/10/2010   Aortic valve replaced 04/10/2010    ONSET DATE: 05/19/2022 (referral date)  REFERRING DIAG: I10 (ICD-10-CM) - Essential hypertension I10 (ICD-10-CM) - Hypertension, unspecified type I49.3 (ICD-10-CM) -  Frequent PVCs I25.10 (ICD-10-CM) - Coronary artery disease, unspecified vessel or lesion type, unspecified whether angina present, unspecified whether native or transplanted heart I50.42 (ICD-10-CM) - Chronic combined systolic and diastolic heart failure I35.0 (ZOX-09-UE) - Nonrheumatic aortic valve stenosis I25.10 (ICD-10-CM) - Coronary artery disease involving native coronary artery of native heart without angina pectoris I50.31 (ICD-10-CM) - Acute diastolic heart failure Z79.899 (ICD-10-CM) - Medication management R26.89 (ICD-10-CM) - Imbalance  THERAPY DIAG:  Abnormality of gait and mobility  Unsteadiness on feet  Rationale for Evaluation and Treatment: Rehabilitation  SUBJECTIVE:                                                                                                                                                                                             SUBJECTIVE STATEMENT: Patient reports that he started going to the [facility] and started walking. Patient reported that he got 6,000 steps yesterday. Patient plans on going about 3-4 x a week. Patient reports some difficulty going up the stairs to get up the gym. Patient denies any falls or near falls.   Pt accompanied by: self  PERTINENT HISTORY: CAD (s/p CABG with SVG to PDA, 2009), AV dz (s/p AVR in 2009), HTN, diastolic dysfunction and PVCs, reports most recent heart surgery in August 2023  PAIN:  Are you having pain? No  PRECAUTIONS: Fall  WEIGHT BEARING RESTRICTIONS: No  FALLS: Has patient fallen in last 6 months? Yes. Number of falls 1 fall into hospital and 1 time near fall when in church and someone had to catch him due to feeling lightheaded  LIVING ENVIRONMENT: Lives with: lives with their spouse Lives in: House/apartment - townhouse Stairs: Yes: Internal: 12 steps; on left going up and External: 8 steps; on right going up Has following equipment at home: Single point cane, Walker - 2 wheeled, and Grab  bars  PLOF: Independent  PATIENT GOALS: "I guess my balance."  OBJECTIVE:   DIAGNOSTIC FINDINGS:   CT Angio Chest Aorta 09/19/2021:  IMPRESSION: 1. Vascular findings and measurements pertinent to potential TAVR procedure,  as detailed above. 2. Prior aortic valve replacement. 3. Severe aortoiliac atherosclerosis. 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 4. Left main and 3 vessel coronary artery disease. 5. Pulmonary edema and small bilateral pleural effusions.  COGNITION: Overall cognitive status: Within functional limits for tasks assessed  TODAY'S TREATMENT:                                                                                                                               Vitals:   06/02/22 1241  BP: (!) 143/56  Pulse: (!) 56  SpO2: 99%   NMR:  [facility] PT Assessment - 06/02/22 0001       Standardized Balance Assessment   Standardized Balance Assessment Mini-BESTest      Mini-BESTest   Sit To Stand Moderate: Comes to stand WITH use of hands on first attempt.    Rise to Toes < 3 s.    Stand on one leg (left) Moderate: < 20 s    Stand on one leg (right) Moderate: < 20 s    Stand on one leg - lowest score 1    Compensatory Stepping Correction - Forward Moderate: More than one step is required to recover equilibrium    Compensatory Stepping Correction - Backward Moderate: More than one step is required to recover equilibrium    Compensatory Stepping Correction - Left Lateral Moderate: Several steps to recover equilibrium    Compensatory Stepping Correction - Right Lateral Moderate: Several steps to recover equilibrium    Stepping Corredtion Lateral - lowest score 1    Stance - Feet together, eyes open, firm surface  Normal: 30s    Stance - Feet together, eyes closed, foam surface  Normal: 30s    Incline - Eyes Closed Normal: Stands independently 30s and aligns with  gravity    Change in Gait Speed Normal: Significantly changes walkling speed without imbalance    Walk with head turns - Horizontal Normal: performs head turns with no change in gait speed and good balance    Walk with pivot turns Moderate:Turns with feet close SLOW (>4 steps) with good balance.    Step over obstacles Moderate: Steps over box but touches box OR displays cautious behavior by slowing gait.    Timed UP & GO with Dual Task Moderate: Dual Task affects either counting OR walking (>10%) when compared to the TUG without Dual Task.    Mini-BEST total score 18            Therex: Reviewed Initial HEP: Sit to stand from elevated high low table 3 x 5 without UE support (SBA) Tandem corner balance 2 x 30" (SBA)  PATIENT EDUCATION: Education details: Initial HEP Person educated: Patient Education method: Explanation Education comprehension: verbalized understanding  HOME EXERCISE PROGRAM: Access Code: [email or web address] URL: [email or web address] Date: 06/02/2022 Prepared by: Maryruth Eve  Exercises - Sit to Stand Without Arm Support  -  1 x daily - 7 x weekly - 3 sets - 5 reps - Tandem Stance  - 1 x daily - 7 x weekly - 3 sets - 30 hold  GOALS: Goals reviewed with patient? Yes  LONG TERM GOALS: Target date:  STG = LTG (due to POC length)  Patient will report demonstrate independence with final HEP in order to maintain current gains and continue to progress after physical therapy discharge.  Baseline: To be provided Goal status: INITIAL  2.  Patient will improve mCTSIB score to 120/120 to indicate improved integration of vestibular, proprioceptive, and visual balance systems during static balance tasks in order to reduce risk for falls.   Baseline: 118/120 Goal status: INITIAL  3.  Patient will improve score by 50 meters to demonstrate a clinically meaningful improvement in aerobic capacity and endurance needed for ambulating in the community.   Baseline: 968  feet with SBA (one trip on foot at end) Goal status: INITIAL  4.  Patient will improve gait speed to 0.89 m/s to indicate improvement to the level of community ambulator in order to participate more easily in activities outside of the home.   Baseline: 0.79 m/s with 12.59" with SBA Goal status: INITIAL  5.  Patient will improve miniBEST score to 23/28 or greater to indicate  improved static and dynamic stability.   Baseline: 18/28 Goal status: INITIAL  ASSESSMENT:  CLINICAL IMPRESSION: Session emphasized assessment of static/dynamic balance with mini BEST test in addition to creation of initial HEP. Patient demonstrates need for approximately 3-4 corrective steps with reactive balance and will benefit form continued interventions to work on this area. Patient also demonstrates. Patient improves success with coming to stand from slightly elevated surface with education on anterior weight shift during today's session. Patient will benefit from skilled physical therapy services in order to maximize function and progress towards PLOF.   OBJECTIVE IMPAIRMENTS: Abnormal gait, cardiopulmonary status limiting activity, decreased activity tolerance, decreased balance, decreased endurance, decreased mobility, difficulty walking, decreased ROM, decreased strength, and improper body mechanics.   ACTIVITY LIMITATIONS: carrying, lifting, sitting, standing, squatting, stairs, transfers, and bed mobility  PARTICIPATION LIMITATIONS: community activity and yard work  PERSONAL FACTORS: Age, Fitness, and 3+ comorbidities: see above  are also affecting patient's functional outcome.   REHAB POTENTIAL: Good  CLINICAL DECISION MAKING: Stable/uncomplicated  EVALUATION COMPLEXITY: Low  PLAN:  PT FREQUENCY: 1x/week  PT DURATION: 6 weeks  PLANNED INTERVENTIONS: Therapeutic exercises, Therapeutic activity, Neuromuscular re-education, Balance training, Gait training, Patient/Family education, Self Care, Joint  mobilization, Manual therapy, and Re-evaluation  PLAN FOR NEXT SESSION: components of miniBEST, generalized strengthening/conditioning, work on Comptroller, fall recovery as tolerated  Carmelia Bake, PT, DPT 06/02/2022, 1:24 PM

## 2022-06-05 DIAGNOSIS — N3020 Other chronic cystitis without hematuria: Secondary | ICD-10-CM | POA: Diagnosis not present

## 2022-06-09 ENCOUNTER — Other Ambulatory Visit: Payer: Self-pay | Admitting: Internal Medicine

## 2022-06-11 ENCOUNTER — Ambulatory Visit: Payer: Medicare Other | Admitting: Physical Therapy

## 2022-06-11 ENCOUNTER — Encounter: Payer: Self-pay | Admitting: Physical Therapy

## 2022-06-11 VITALS — BP 135/58 | HR 67

## 2022-06-11 DIAGNOSIS — I493 Ventricular premature depolarization: Secondary | ICD-10-CM | POA: Diagnosis not present

## 2022-06-11 DIAGNOSIS — I5042 Chronic combined systolic (congestive) and diastolic (congestive) heart failure: Secondary | ICD-10-CM | POA: Diagnosis not present

## 2022-06-11 DIAGNOSIS — Z79899 Other long term (current) drug therapy: Secondary | ICD-10-CM | POA: Diagnosis not present

## 2022-06-11 DIAGNOSIS — I10 Essential (primary) hypertension: Secondary | ICD-10-CM | POA: Diagnosis not present

## 2022-06-11 DIAGNOSIS — R2689 Other abnormalities of gait and mobility: Secondary | ICD-10-CM | POA: Diagnosis not present

## 2022-06-11 DIAGNOSIS — R269 Unspecified abnormalities of gait and mobility: Secondary | ICD-10-CM

## 2022-06-11 DIAGNOSIS — R2681 Unsteadiness on feet: Secondary | ICD-10-CM

## 2022-06-11 DIAGNOSIS — I350 Nonrheumatic aortic (valve) stenosis: Secondary | ICD-10-CM | POA: Diagnosis not present

## 2022-06-11 DIAGNOSIS — I2510 Atherosclerotic heart disease of native coronary artery without angina pectoris: Secondary | ICD-10-CM | POA: Diagnosis not present

## 2022-06-11 DIAGNOSIS — I5031 Acute diastolic (congestive) heart failure: Secondary | ICD-10-CM | POA: Diagnosis not present

## 2022-06-11 NOTE — Therapy (Signed)
OUTPATIENT PHYSICAL THERAPY NEURO TREATMENT   Patient Name: Luis Herrera MRN: [id number] DOB:1940-09-13, 82 y.o., male Today's Date: 06/11/2022   PCP: Abbe Amsterdam, MD REFERRING PROVIDER: Pricilla Riffle, MD  END OF SESSION:  PT End of Session - 06/11/22 1233     Visit Number 3    Number of Visits 7    Date for PT Re-Evaluation 05/26/22    Authorization Type [employer] Medicare    PT Start Time 1233    PT Stop Time 1316    PT Time Calculation (min) 43 min    Equipment Utilized During Treatment Gait belt    Activity Tolerance Patient tolerated treatment well    Behavior During Therapy WFL for tasks assessed/performed             Past Medical History:  Diagnosis Date   Aortic stenosis 2008   Arthritis    Blood transfusion    Blood transfusion without reported diagnosis    CAD (coronary artery disease) 2008   Cataract    Complication of anesthesia    hallucinated once   H/O hiatal hernia    Heart murmur    AFTER REPLACED VALVE WENT AWAY   Hx of colonic polyps    Hyperlipidemia    Hypertension    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 Lynnette Caffey and Dr. Laneta Simmers   Thyroid disease    Past Surgical History:  Procedure Laterality Date   AORTIC VALVE REPLACEMENT  2009   Bioprosthetic at [facility]   CARDIAC CATHETERIZATION  2008   CHOLECYSTECTOMY  1982   COLONOSCOPY     CORONARY ARTERY BYPASS GRAFT  2009   SVG-PDA w/ AVR   CORRECTION HAMMER TOE  2011   BILATERAL   GASTROCNEMIUS RECESSION  05/28/2011   INTRAOPERATIVE TRANSTHORACIC ECHOCARDIOGRAM N/A 09/30/2021   Procedure: INTRAOPERATIVE TRANSTHORACIC ECHOCARDIOGRAM;  Surgeon: Orbie Pyo, MD;  Location: [facility];  Service: Open Heart Surgery;  Laterality: N/A;   POLYPECTOMY     RIGHT/LEFT HEART CATH AND CORONARY/GRAFT ANGIOGRAPHY N/A 09/24/2021   Procedure: RIGHT/LEFT HEART CATH AND CORONARY/GRAFT  ANGIOGRAPHY;  Surgeon: Orbie Pyo, MD;  Location: [facility];  Service: Cardiovascular;  Laterality: N/A;   TONSILLECTOMY     TOTAL HIP ARTHROPLASTY Left 2000   TOTAL KNEE ARTHROPLASTY  2000   left   TOTAL KNEE ARTHROPLASTY  12/01/2011   Procedure: TOTAL KNEE ARTHROPLASTY;  Surgeon: Shelda Pal, MD;  Location: [facility];  Service: Orthopedics;  Laterality: Right;   TRANSCATHETER AORTIC VALVE REPLACEMENT, TRANSFEMORAL Right 09/30/2021   Procedure: Transcatheter Aortic Valve Replacement, Transfemoral;  Surgeon: Orbie Pyo, MD;  [facility]: [facility];  Service: Open Heart Surgery;  Laterality: Right;   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   Procedure: TRANSURETHRAL RESECTION OF BLADDER TUMOR (TURBT) BILATERAL RETROGRADE PYELOGRAM;  Surgeon: Crista Elliot, MD;  Location: [facility];  Service: Urology;  Laterality: Bilateral;  45 MINS FOR CASE   WEIL OSTEOTOMY  05/28/2011   Patient Active Problem List   Diagnosis Date Noted   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   Acute diastolic heart failure 09/19/2021   Severe aortic stenosis 09/18/2021   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, LEFT  10/17/2019   Anemia 09/12/2019   Frequent UTI 04/13/2018   Hypothyroidism 04/13/2018  Right bundle branch block 09/27/2017   CAD (coronary artery disease) 09/27/2017   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/2017   Acquired claw toe of right foot 06/04/2016   Class 1 obesity with body mass index (BMI) of 31.0 to 31.9 in adult 12/02/2011   Back pain of thoracolumbar region 09/01/2010   Hyperlipidemia with target LDL less than 130 04/10/2010   Essential hypertension 04/10/2010   Aortic valve replaced 04/10/2010    ONSET DATE: 05/19/2022 (referral date)  REFERRING DIAG: I10 (ICD-10-CM) - Essential hypertension I10 (ICD-10-CM) - Hypertension, unspecified type I49.3 (ICD-10-CM) -  Frequent PVCs I25.10 (ICD-10-CM) - Coronary artery disease, unspecified vessel or lesion type, unspecified whether angina present, unspecified whether native or transplanted heart I50.42 (ICD-10-CM) - Chronic combined systolic and diastolic heart failure I35.0 (ZOX-09-UE) - Nonrheumatic aortic valve stenosis I25.10 (ICD-10-CM) - Coronary artery disease involving native coronary artery of native heart without angina pectoris I50.31 (ICD-10-CM) - Acute diastolic heart failure Z79.899 (ICD-10-CM) - Medication management R26.89 (ICD-10-CM) - Imbalance  THERAPY DIAG:  Abnormality of gait and mobility  Unsteadiness on feet  Rationale for Evaluation and Treatment: Rehabilitation  SUBJECTIVE:                                                                                                                                                                                             SUBJECTIVE STATEMENT: Patient reports that he is doing well; denies falls/near falls. States he has been able to get up without UE use but can't do as many reps given leg weakness on the L.  Pt accompanied by: self  PERTINENT HISTORY: CAD (s/p CABG with SVG to PDA, 2009), AV dz (s/p AVR in 2009), HTN, diastolic dysfunction and PVCs, reports most recent heart surgery in August 2023  PAIN:  Are you having pain? No  PRECAUTIONS: Fall  WEIGHT BEARING RESTRICTIONS: No  FALLS: Has patient fallen in last 6 months? Yes. Number of falls 1 fall into hospital and 1 time near fall when in church and someone had to catch him due to feeling lightheaded  LIVING ENVIRONMENT: Lives with: lives with their spouse Lives in: House/apartment - townhouse Stairs: Yes: Internal: 12 steps; on left going up and External: 8 steps; on right going up Has following equipment at home: Single point cane, Walker - 2 wheeled, and Grab bars  PLOF: Independent  PATIENT GOALS: "I guess my balance."  OBJECTIVE:   DIAGNOSTIC FINDINGS:   CT Angio  Chest Aorta 09/19/2021:  IMPRESSION: 1. Vascular findings and measurements pertinent to potential TAVR procedure, as detailed above. 2. Prior aortic valve replacement. 3. Severe aortoiliac atherosclerosis. 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 4. Left main and 3 vessel coronary artery disease. 5. Pulmonary edema and small bilateral pleural effusions.  COGNITION: Overall cognitive status: Within functional limits for tasks assessed  TODAY'S TREATMENT:                                                                                                                               Vitals:   06/11/22 1238  BP: (!) 135/58  Pulse: 67    NMR: Hill sprints on Nustep with alternating UE and LE motion at lvl 5 x 5 min for neural priming   On firm ground with UE taps with mirror on blaze pods and ground level with LE taps - Round 1: 24 taps - Round 2: 26 taps - Round 3: 24 taps  Marching with alt UE taps, Fwd step returns, Backward step returns x 30" as recovery between each blaze pod step on firm surface  On foam airex pad with UE taps with mirror on blaze pods and ground level with LE taps (CGA) - Round 1: 24 taps - Round 2: 26 taps - Round 3: 24 taps  Marching with alt UE taps, Backward step returns x 30" as recovery between each blaze pod step on airex pad  Reactive balance random practice (fwd, backward, laterally) x 5 reps - emphasis on only taking one large step back  Resisted walking with perturbations at random (fwd, back, laterally) x 230 feet (CGA)  PATIENT EDUCATION: Education details: Continue HEP Person educated: Patient Education method: Explanation Education comprehension: verbalized understanding  HOME EXERCISE PROGRAM: Access Code: [email or web address] URL: [email or web address] Date: 06/02/2022 Prepared by: Maryruth Eve  Exercises - Sit to Stand Without Arm  Support  - 1 x daily - 7 x weekly - 3 sets - 5 reps - Tandem Stance  - 1 x daily - 7 x weekly - 3 sets - 30 hold  GOALS: Goals reviewed with patient? Yes  LONG TERM GOALS: Target date:  STG = LTG (due to POC length)  Patient will report demonstrate independence with final HEP in order to maintain current gains and continue to progress after physical therapy discharge.  Baseline: To be provided Goal status: INITIAL  2.  Patient will improve mCTSIB score to 120/120 to indicate improved integration of vestibular, proprioceptive, and visual balance systems during static balance tasks in order to reduce risk for falls.   Baseline: 118/120 Goal status: INITIAL  3.  Patient will improve score by 50 meters to demonstrate a clinically meaningful improvement in aerobic capacity and endurance needed for ambulating in the community.   Baseline: 968 feet with SBA (one trip on foot at end) Goal status: INITIAL  4.  Patient will improve gait speed to 0.89 m/s to indicate improvement to the level of community ambulator in order to participate more easily in activities outside of the home.   Baseline: 0.79 m/s  with 12.59" with SBA Goal status: INITIAL  5.  Patient will improve miniBEST score to 23/28 or greater to indicate  improved static and dynamic stability.   Baseline: 18/28 Goal status: INITIAL  ASSESSMENT:  CLINICAL IMPRESSION: Session emphasized work on stepping strategies with/without perturbations on compliant and firm surfaces. Patient demonstrated improved carryover when retested reactive component of miniBEST scoring a 2/2 on each direction when scored (an improvement from 1/2 last session). Patient continues to demonstrate greatest challenges on compliant surfaces with diminished dorsiflexion with fatigue. Patient will benefit from skilled physical therapy services in order to maximize function and progress towards PLOF.   OBJECTIVE IMPAIRMENTS: Abnormal gait, cardiopulmonary  status limiting activity, decreased activity tolerance, decreased balance, decreased endurance, decreased mobility, difficulty walking, decreased ROM, decreased strength, and improper body mechanics.   ACTIVITY LIMITATIONS: carrying, lifting, sitting, standing, squatting, stairs, transfers, and bed mobility  PARTICIPATION LIMITATIONS: community activity and yard work  PERSONAL FACTORS: Age, Fitness, and 3+ comorbidities: see above  are also affecting patient's functional outcome.   REHAB POTENTIAL: Good  CLINICAL DECISION MAKING: Stable/uncomplicated  EVALUATION COMPLEXITY: Low  PLAN:  PT FREQUENCY: 1x/week  PT DURATION: 6 weeks  PLANNED INTERVENTIONS: Therapeutic exercises, Therapeutic activity, Neuromuscular re-education, Balance training, Gait training, Patient/Family education, Self Care, Joint mobilization, Manual therapy, and Re-evaluation  PLAN FOR NEXT SESSION: components of miniBEST, generalized strengthening/conditioning, work on endurance/gait, fall recovery as tolerated; fall recovery, balance on compliant surface, update HEP  Carmelia Bake, PT, DPT 06/11/2022, 1:22 PM

## 2022-06-13 ENCOUNTER — Other Ambulatory Visit: Payer: Self-pay | Admitting: Internal Medicine

## 2022-06-15 ENCOUNTER — Ambulatory Visit: Payer: Medicare Other | Admitting: Student

## 2022-06-16 ENCOUNTER — Ambulatory Visit: Payer: Medicare Other | Admitting: Physical Therapy

## 2022-06-19 ENCOUNTER — Telehealth: Payer: Self-pay | Admitting: Family Medicine

## 2022-06-19 NOTE — Telephone Encounter (Signed)
Contacted [name] to schedule their annual wellness visit. Appointment made for 06/22/22.  Rudell Cobb AWV direct phone # [phone]

## 2022-06-22 NOTE — Progress Notes (Signed)
Subjective:   Luis Herrera is a 82 y.o. male who presents for Medicare Annual/Subsequent preventive examination.  Review of Systems           Objective:    There were no vitals filed for this visit. There is no height or weight on file to calculate BMI.     05/26/2022    1:55 PM 11/24/2021   10:03 AM 11/21/2021    1:05 PM 11/04/2021    2:08 PM 09/24/2021    1:16 PM 09/13/2019    2:00 AM 04/12/2018   10:43 PM  Advanced Directives  Does Patient Have a Medical Advance Directive? No No No Yes No No No  Type of Advance Directive    Living will;Healthcare Power of Attorney     Does patient want to make changes to medical advance directive?  No - Patient declined No - Patient declined No - Patient declined     Would patient like information on creating a medical advance directive? No - Patient declined Yes (Inpatient - patient requests chaplain consult to create a medical advance directive)   No - Patient declined No - Patient declined No - Patient declined    Current Medications (verified) Outpatient Encounter Medications as of 06/22/2022  Medication Sig   amiodarone (PACERONE) 200 MG tablet TAKE ONE-HALF TABLET BY MOUTH  DAILY   amLODipine (NORVASC) 5 MG tablet TAKE 1 TABLET (5 MG TOTAL) BY MOUTH DAILY.   aspirin EC 81 MG tablet Take 1 tablet (81 mg total) by mouth daily. Swallow whole.   atorvastatin (LIPITOR) 80 MG tablet TAKE 1 TABLET BY MOUTH ONCE  DAILY   Calcium Carb-Cholecalciferol (CALCIUM PLUS VITAMIN D3 PO) Take 2 tablets by mouth daily. Gummy supplements   carvedilol (COREG) 6.25 MG tablet TAKE 1 TABLET BY MOUTH TWICE  DAILY   ezetimibe (ZETIA) 10 MG tablet Take 1 tablet (10 mg total) by mouth daily.   furosemide (LASIX) 20 MG tablet Take 1 tablet (20 mg total) by mouth daily. (Patient taking differently: Take 20 mg by mouth as needed.)   furosemide (LASIX) 40 MG tablet TAKE ONE TABLET (40 MG) EVERY OTHER DAY ALTERNATING WITH 1 1/2 TABLETS (60 MG) ON THE OPPOSITE DAYS.    levothyroxine (SYNTHROID) 50 MCG tablet TAKE 1 TABLET BY MOUTH DAILY  BEFORE BREAKFAST   Multiple Vitamins-Minerals (PRESERVISION AREDS 2) CAPS Take 1 capsule by mouth 2 (two) times daily.   nitrofurantoin, macrocrystal-monohydrate, (MACROBID) 100 MG capsule Take 100 mg by mouth at bedtime.   sacubitril-valsartan (ENTRESTO) 24-26 MG Take 1 tablet by mouth 2 (two) times daily.   tamsulosin (FLOMAX) 0.4 MG CAPS capsule Take 0.4 mg by mouth in the morning and at bedtime.   No facility-administered encounter medications on file as of 06/22/2022.    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 Lynnette Caffey and Dr. Laneta Simmers   Thyroid disease    Past Surgical  History:  Procedure Laterality Date   AORTIC VALVE REPLACEMENT  2009   [facility]oprosthetic at [facility]   CARDIAC CATHETERIZATION  2008   CHOLECYSTECTOMY  1982   COLONOSCOPY     CORONARY ARTERY BYPASS GRAFT  2009   SVG-PDA w/ AVR   CORRECTION HAMMER TOE  2011   BILATERAL   GASTROCNEMIUS RECESSION  05/28/2011   INTRAOPERATIVE TRANSTHORACIC ECHOCARDIOGRAM N/A 09/30/2021   Procedure: INTRAOPERATIVE TRANSTHORACIC ECHOCARDIOGRAM;  Surgeon: Orbie Pyo, MD;  [facility];  Service: Open Heart Surgery;  Laterality: N/A;   POLYPECTOMY     RIGHT/LEFT HEART CATH AND CORONARY/GRAFT ANGIOGRAPHY N/A 09/24/2021   Procedure: RIGHT/LEFT HEART CATH AND CORONARY/GRAFT ANGIOGRAPHY;  Surgeon:  Orbie Pyo, MD;  [facility]: [facility];  Service: Cardiovascular;  Laterality: N/A;   TONSILLECTOMY     TOTAL HIP ARTHROPLASTY Left 2000   TOTAL KNEE ARTHROPLASTY  2000   left   TOTAL KNEE ARTHROPLASTY  12/01/2011   Procedure: TOTAL KNEE ARTHROPLASTY;  Surgeon: Shelda Pal, MD;  Location: [facility];  Service: Orthopedics;  Laterality: Right;   TRANSCATHETER AORTIC VALVE REPLACEMENT, TRANSFEMORAL Right 09/30/2021   Procedure: Transcatheter Aortic Valve Replacement, Transfemoral;  Surgeon: Orbie Pyo, MD;  [facility];  Service: Open Heart Surgery;  Laterality: Right;   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   Procedure: TRANSURETHRAL RESECTION OF BLADDER TUMOR (TURBT) BILATERAL RETROGRADE PYELOGRAM;  Surgeon: Crista Elliot, MD;  Location: [facility];  Service: Urology;  Laterality: Bilateral;  45 MINS FOR CASE   WEIL OSTEOTOMY  05/28/2011   Family History  Problem Relation Age of Onset   Colon cancer Mother 58   Heart attack Father    Hypertension Other    Hyperlipidemia Other    Heart attack Brother    Hypertension Sister    Hypertension Brother    Stroke Neg Hx    Esophageal cancer Neg Hx    Rectal cancer Neg Hx    Stomach cancer Neg Hx    Social History   Socioeconomic History   Marital status: Married    Spouse name: Not on file   Number of children: 2   Years of education: 12   Highest education level: High school graduate  Occupational History   Occupation: retired  Tobacco Use   Smoking status: Former    Types: Cigarettes    Quit date: 05/25/1973    Years since quitting: 49.1   Smokeless tobacco: Never  Vaping Use   Vaping Use: Never used  Substance and Sexual Activity   Alcohol use: Not Currently    Comment: rare   Drug use: No   Sexual activity: Not Currently  Other Topics Concern   Not on file  Social History Narrative   Regular exercise-yes. Pt lives in [facility] [place] condo with his wife.    Social Determinants of Health   Financial Resource Strain: Low Risk  (01/21/2022)   Overall Financial Resource Strain (CARDIA)    Difficulty of Paying Living Expenses: Not hard at all  Food Insecurity: No Food Insecurity (01/21/2022)   Hunger Vital Sign    Worried About Running Out of Food in the Last Year: Never true    Ran Out of Food in the Last Year: Never true  Transportation Needs: No Transportation Needs (01/21/2022)   PRAPARE - Administrator, Civil Service (Medical): No    Lack of Transportation (Non-Medical):  No  Physical Activity: Sufficiently Active (04/08/2022)   Exercise Vital Sign    Days of Exercise per Week: 4 days    Minutes of Exercise per Session: 50 min  Stress: Not on file  Social Connections: Not on file    Tobacco Counseling Counseling given: Not Answered   Clinical Intake:                 Diabetic?         Activities of Daily Living    11/21/2021    1:08 PM 11/21/2021    1:02 PM  In your present state of health, do you have any difficulty performing the following activities:  Hearing?  0  Vision?  0  Comment  reading glasses,  had Lasik surgery  Difficulty concentrating or making decisions?  0  Walking or climbing stairs?  0  Dressing or bathing?  0  Doing errands, shopping? 0     Patient Care Team: Deeann Saint, MD as PCP - General (Family Medicine) Pricilla Riffle, MD as PCP - Cardiology (Cardiology) Yetta Glassman, RN as [facility] Care Management  Indicate any recent Medical Services you may have received from other than [facility] providers in the past year (date may be approximate).     Assessment:   This is a routine wellness examination for Luis Herrera.  Hearing/Vision screen No results found.  Dietary issues and exercise activities discussed:     Goals Addressed   None   Depression Screen    01/07/2022   11:15 AM 11/04/2021    2:07 PM 10/23/2021   10:35 AM 05/19/2021    9:41 AM 05/27/2020    11:06 AM 10/17/2019   10:02 AM 01/12/2017    2:42 PM  PHQ 2/9 Scores  PHQ - 2 Score 0 0 0 0 0 0 0    Fall Risk    11/04/2021    2:10 PM 10/23/2021   11:38 AM 05/19/2021    9:41 AM 05/27/2020   11:06 AM 10/17/2019   10:02 AM  Fall Risk   Falls in the past year?  0 0 0 1  Number falls in past yr:  0 0  0  Injury with Fall?  0 0  0  Risk for fall due to : No Fall Risks Impaired balance/gait  No Fall Risks Impaired balance/gait;Impaired mobility  Follow up Falls prevention discussed Education provided  Falls evaluation completed Falls evaluation completed    FALL RISK PREVENTION PERTAINING TO THE HOME:  Any stairs in or around the home?  If so, are there any without handrails?  Home free of loose throw rugs in walkways, pet beds, electrical cords, etc?  Adequate lighting in your home to reduce risk of falls?   ASSISTIVE DEVICES UTILIZED TO PREVENT FALLS:  Life alert?  Use of a cane, walker or w/c?  Grab bars in the bathroom?  Shower chair or bench in shower?  Elevated toilet seat or a handicapped toilet?   TIMED UP AND GO:  Was the test performed?.  Length of time to ambulate 10 feet:   sec.     Cognitive Function:        Immunizations Immunization History  Administered Date(s) Administered   COVID-19, mRNA, vaccine(Comirnaty)12 years and older 12/05/2021   Fluad Quad(high Dose 65+) 12/27/2020   Influenza Split 11/25/2011, 12/20/2013   Influenza Whole 11/22/2009   Influenza, High Dose Seasonal PF 12/27/2012, 12/13/2014, 11/14/2015, 11/27/2016, 11/18/2017, 10/04/2018, 11/16/2019   Influenza-Unspecified 12/17/2017  Pneumococcal Conjugate-13 08/14/2013   Pneumococcal Polysaccharide-23 12/19/2008   Tdap 09/01/2010   Zoster Recombinat (Shingrix) 07/19/2017, 12/02/2017   Zoster, Live 11/18/2015            Qualifies for Shingles Vaccine?   Zostavax completed     Screening Tests Health Maintenance  Topic Date Due   Medicare Annual Wellness (AWV)  Never  done   DTaP/Tdap/Td (2 - Td or Tdap) 08/31/2020   COVID-19 Vaccine (2 - Pfizer risk series) 12/26/2021   INFLUENZA VACCINE  09/17/2022   COLONOSCOPY (Pts 45-78yrs Insurance coverage will need to be confirmed)  08/03/2023   Pneumonia Vaccine 32+ Years old  Completed   Zoster Vaccines- Shingrix  Completed   HPV VACCINES  Aged Out    Health Maintenance  Health Maintenance Due  Topic Date Due   Medicare Annual Wellness (AWV)  Never done   DTaP/Tdap/Td (2 - Td or Tdap) 08/31/2020   COVID-19 Vaccine (2 - Pfizer risk series) 12/26/2021      Lung Cancer Screening: (Low Dose CT Chest recommended if Age 8-80 years, 30 pack-year currently smoking OR have quit w/in 15years.)  qualify.   Lung Cancer Screening Referral:   Additional Screening:  Hepatitis C Screening:  qualify; Completed   Vision Screening: Recommended annual ophthalmology exams for early detection of glaucoma and other disorders of the eye. Is the patient up to date with their annual eye exam?   Who is the provider or what is the name of the office in which the patient attends annual eye exams?  If pt is not established with a provider, would they like to be referred to a provider to establish care? .   Dental Screening: Recommended annual dental exams for proper oral hygiene  Community Resource Referral / Chronic Care Management: CRR required this visit?    CCM required this visit?       Plan:     I have personally reviewed and noted the following in the patient's chart:   Medical and social history Use of alcohol, tobacco or illicit drugs  Current medications and supplements including opioid prescriptions.  Functional ability and status Nutritional status Physical activity Advanced directives List of other physicians Hospitalizations, surgeries, and ER visits in previous 12 months Vitals Screenings to include cognitive, depression, and falls Referrals and appointments  In addition, I have reviewed  and discussed with patient certain preventive protocols, quality metrics, and best practice recommendations. A written personalized care plan for preventive services as well as general preventive health recommendations were provided to patient.     Tillie Rung, LPN   02/21/1094   Nurse Notes:     This encounter was created in error - please disregard.

## 2022-06-23 ENCOUNTER — Ambulatory Visit: Payer: Medicare Other | Attending: Internal Medicine | Admitting: Physical Therapy

## 2022-06-23 VITALS — BP 112/56 | HR 67

## 2022-06-23 DIAGNOSIS — M21962 Unspecified acquired deformity of left lower leg: Secondary | ICD-10-CM | POA: Diagnosis not present

## 2022-06-23 DIAGNOSIS — R269 Unspecified abnormalities of gait and mobility: Secondary | ICD-10-CM | POA: Insufficient documentation

## 2022-06-23 DIAGNOSIS — D2372 Other benign neoplasm of skin of left lower limb, including hip: Secondary | ICD-10-CM | POA: Diagnosis not present

## 2022-06-23 DIAGNOSIS — B351 Tinea unguium: Secondary | ICD-10-CM | POA: Diagnosis not present

## 2022-06-23 DIAGNOSIS — I70203 Unspecified atherosclerosis of native arteries of extremities, bilateral legs: Secondary | ICD-10-CM | POA: Diagnosis not present

## 2022-06-23 DIAGNOSIS — R2681 Unsteadiness on feet: Secondary | ICD-10-CM | POA: Insufficient documentation

## 2022-06-23 DIAGNOSIS — M21961 Unspecified acquired deformity of right lower leg: Secondary | ICD-10-CM | POA: Diagnosis not present

## 2022-06-23 NOTE — Therapy (Signed)
OUTPATIENT PHYSICAL THERAPY NEURO TREATMENT   Patient Name: Luis Herrera MRN: [id number] DOB:May 15, 1940, 82 y.o., male Today's Date: 06/23/2022   PCP: Abbe Amsterdam, MD REFERRING PROVIDER: Pricilla Riffle, MD  END OF SESSION:  PT End of Session - 06/23/22 1302     Visit Number 4    Number of Visits 7    Date for PT Re-Evaluation 05/26/22    Authorization Type [employer]    PT Start Time 1232    PT Stop Time 1316    PT Time Calculation (min) 44 min    Equipment Utilized During Treatment Gait belt    Activity Tolerance Patient tolerated treatment well    Behavior During Therapy [facility] for tasks assessed/performed              Past Medical History:  Diagnosis Date   Aortic stenosis 2008   Arthritis    Blood transfusion    Blood transfusion without reported diagnosis    CAD (coronary artery disease) 2008   Cataract    Complication of anesthesia    hallucinated once   H/O hiatal hernia    Heart murmur    AFTER REPLACED VALVE WENT AWAY   Hx of colonic polyps    Hyperlipidemia    Hypertension    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 Lynnette Caffey and Dr. Laneta Simmers   Thyroid disease    Past Surgical History:  Procedure Laterality Date   AORTIC VALVE REPLACEMENT  2009   Bioprosthetic at [facility]   CARDIAC CATHETERIZATION  2008   CHOLECYSTECTOMY  1982   COLONOSCOPY     CORONARY ARTERY BYPASS GRAFT  2009   SVG-PDA w/ AVR   CORRECTION HAMMER TOE  2011   BILATERAL   GASTROCNEMIUS RECESSION  05/28/2011   INTRAOPERATIVE TRANSTHORACIC ECHOCARDIOGRAM N/A 09/30/2021   Procedure: INTRAOPERATIVE TRANSTHORACIC ECHOCARDIOGRAM;  Surgeon: Orbie Pyo, MD;  [facility];  Service: Open Heart Surgery;  Laterality: N/A;   POLYPECTOMY     RIGHT/LEFT HEART CATH AND CORONARY/GRAFT ANGIOGRAPHY N/A 09/24/2021   Procedure: RIGHT/LEFT HEART CATH AND CORONARY/GRAFT  ANGIOGRAPHY;  Surgeon: Orbie Pyo, MD;  [facility]: [facility];  Service: Cardiovascular;  Laterality: N/A;   TONSILLECTOMY     TOTAL HIP ARTHROPLASTY Left 2000   TOTAL KNEE ARTHROPLASTY  2000   left   TOTAL KNEE ARTHROPLASTY  12/01/2011   Procedure: TOTAL KNEE ARTHROPLASTY;  Surgeon: Shelda Pal, MD;  Location: [facility];  Service: Orthopedics;  Laterality: Right;   TRANSCATHETER AORTIC VALVE REPLACEMENT, TRANSFEMORAL Right 09/30/2021   Procedure: Transcatheter Aortic Valve Replacement, Transfemoral;  Surgeon: Orbie Pyo, MD;  [facility];  Service: Open Heart Surgery;  Laterality: Right;   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   Procedure: TRANSURETHRAL RESECTION OF BLADDER TUMOR (TURBT) BILATERAL RETROGRADE PYELOGRAM;  Surgeon: Crista Elliot, MD;  Location: [facility];  Service: Urology;  Laterality: Bilateral;  45 MINS FOR CASE   WEIL OSTEOTOMY  05/28/2011   Patient Active Problem List   Diagnosis Date Noted   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   Acute diastolic heart failure (HCC) 09/19/2021   Severe aortic stenosis 09/18/2021   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, LEFT  10/17/2019   Anemia 09/12/2019   Frequent UTI 04/13/2018  Hypothyroidism 04/13/2018   Right bundle branch block 09/27/2017   CAD (coronary artery disease) 09/27/2017   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/2017   Acquired claw toe of right foot 06/04/2016   Class 1 obesity with body mass index (BMI) of 31.0 to 31.9 in adult 12/02/2011   Back pain of thoracolumbar region 09/01/2010   Hyperlipidemia with target LDL less than 130 04/10/2010   Essential hypertension 04/10/2010   Aortic valve replaced 04/10/2010    ONSET DATE: 05/19/2022 (referral date)  REFERRING DIAG: I10 (ICD-10-CM) - Essential hypertension I10 (ICD-10-CM) - Hypertension, unspecified type I49.3 (ICD-10-CM) -  Frequent PVCs I25.10 (ICD-10-CM) - Coronary artery disease, unspecified vessel or lesion type, unspecified whether angina present, unspecified whether native or transplanted heart I50.42 (ICD-10-CM) - Chronic combined systolic and diastolic heart failure I35.0 ([id number]) - Nonrheumatic aortic valve stenosis I25.10 (ICD-10-CM) - Coronary artery disease involving native coronary artery of native heart without angina pectoris I50.31 (ICD-10-CM) - Acute diastolic heart failure Z79.899 (ICD-10-CM) - Medication management R26.89 (ICD-10-CM) - Imbalance  THERAPY DIAG:  Abnormality of gait and mobility  Unsteadiness on feet  Rationale for Evaluation and Treatment: Rehabilitation  SUBJECTIVE:                                                                                                                                                                                             SUBJECTIVE STATEMENT: Patient reports that last week; he wasn't feeling very good. Today he reports feeling a little light headed but improves with seated break. Patient denies falls/near falls.  Pt accompanied by: self  PERTINENT HISTORY: CAD (s/p CABG with SVG to PDA, 2009), AV dz (s/p AVR in 2009), HTN, diastolic dysfunction and PVCs, reports most recent heart surgery in August 2023  PAIN:  Are you having pain? No  PRECAUTIONS: Fall  WEIGHT BEARING RESTRICTIONS: No  FALLS: Has patient fallen in last 6 months? Yes. Number of falls 1 fall into hospital and 1 time near fall when in church and someone had to catch him due to feeling lightheaded  LIVING ENVIRONMENT: Lives with: lives with their spouse Lives in: House/apartment - townhouse Stairs: Yes: Internal: 12 steps; on left going up and External: 8 steps; on right going up Has following equipment at home: Single point cane, Walker - 2 wheeled, and Grab bars  PLOF: Independent  PATIENT GOALS: "I guess my balance."  OBJECTIVE:   DIAGNOSTIC FINDINGS:   CT Angio  Chest Aorta 09/19/2021:  IMPRESSION: 1. Vascular findings and measurements pertinent to potential TAVR procedure, as detailed above. 2. Prior aortic valve replacement. 3. Severe aortoiliac atherosclerosis. 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 4. Left main and 3 vessel coronary artery disease. 5. Pulmonary edema and small bilateral pleural effusions.  COGNITION: Overall cognitive status: Within functional limits for tasks assessed  TODAY'S TREATMENT:                                                                                                                               TherAct: Assessed vitals at start of session.  Vitals:   06/23/22 1240 06/23/22 1244  BP: (!) 140/56 (!) 112/56  Pulse:    Initial Reading: seated Second Reading: standing after 3 minutes  Symptoms consistent with possible orthostatic hypotension. Discussed safety strategies including taking time with changes in positions, arms and leg pumps etc. Recommend follow up with cardiology if becomes more challenging to discuss other options as well.  NMR: : 1035 feet or 315 meters with SBA MCTSIB:  Condition 1: 30" no sway Condition 2: 30" min sway Condition 3: 30" min sway Condition 4: 30" mod sway NBOS standing on foam EC with horizontal head turns 3 x 30"  PATIENT EDUCATION: Education details: Continue HEP Person educated: Patient Education method: Explanation Education comprehension: verbalized understanding  HOME EXERCISE PROGRAM: Access Code: [email or web address] URL: [email or web address] Date: 06/02/2022 Prepared by: Maryruth Eve  Exercises - Sit to Stand Without Arm Support  - 1 x daily - 7 x weekly - 3 sets - 5 reps - Tandem Stance  - 1 x daily - 7 x weekly - 3 sets - 30 hold - EC NBOS corner balance with horizontal head turns 3 x 10 turns  GOALS: Goals reviewed with patient? Yes  LONG  TERM GOALS: Target date:  STG = LTG (due to POC length)  Patient will report demonstrate independence with final HEP in order to maintain current gains and continue to progress after physical therapy discharge.  Baseline: To be provided Goal status: INITIAL  2.  Patient will improve mCTSIB score to 120/120 to indicate improved integration of vestibular, proprioceptive, and visual balance systems during static balance tasks in order to reduce risk for falls.   Baseline: 118/120; improved to 120/120 Goal status: MET  3.  Patient will improve score by 50 meters to demonstrate a clinically meaningful improvement in aerobic capacity and endurance needed for ambulating in the community.   Baseline: 968 feet or 295 meters with SBA (one trip on foot at end); 1035 feet or 315 meters with SBA - improved by 17 meters Goal status: MET  4.  Patient will improve gait speed to 0.89 m/s to indicate improvement to the level of community ambulator in order to participate more easily in activities outside of the home.   Baseline: 0.79 m/s with 12.59" with SBA Goal status: INITIAL  5.  Patient will improve miniBEST score to 23/28 or greater to indicate  improved static and dynamic stability.   Baseline: 18/28 Goal status: INITIAL  ASSESSMENT:  CLINICAL  IMPRESSION: Patient achieved 2 of long term goals during today's session demonstrating improved integration of vestibular, proprioceptive, and visual field on mCTSIB as well as improved cardiovascular endurance as indicated by . Patient reported lightheadedness at start of session so accessed for orthostatic tendencies, which patient does demonstrate. Discussed compensatory strategies during session. Patient reports difficulty with head turns and gait so will plan to work on this next session. Patient will benefit from skilled physical therapy services in order to maximize function and progress towards PLOF.   OBJECTIVE IMPAIRMENTS: Abnormal gait,  cardiopulmonary status limiting activity, decreased activity tolerance, decreased balance, decreased endurance, decreased mobility, difficulty walking, decreased ROM, decreased strength, and improper body mechanics.   ACTIVITY LIMITATIONS: carrying, lifting, sitting, standing, squatting, stairs, transfers, and bed mobility  PARTICIPATION LIMITATIONS: community activity and yard work  PERSONAL FACTORS: Age, Fitness, and 3+ comorbidities: see above  are also affecting patient's functional outcome.   REHAB POTENTIAL: Good  CLINICAL DECISION MAKING: Stable/uncomplicated  EVALUATION COMPLEXITY: Low  PLAN:  PT FREQUENCY: 1x/week  PT DURATION: 6 weeks  PLANNED INTERVENTIONS: Therapeutic exercises, Therapeutic activity, Neuromuscular re-education, Balance training, Gait training, Patient/Family education, Self Care, Joint mobilization, Manual therapy, and Re-evaluation  PLAN FOR NEXT SESSION: components of miniBEST, generalized strengthening/conditioning, work on endurance/gait, fall recovery as tolerated; fall recovery, balance on compliant surface, gait with head turns and on compliant surfaces. update HEP  Carmelia Bake, PT, DPT 06/23/2022, 1:48 PM

## 2022-06-30 ENCOUNTER — Ambulatory Visit: Payer: Medicare Other | Admitting: Physical Therapy

## 2022-06-30 ENCOUNTER — Encounter: Payer: Self-pay | Admitting: Physical Therapy

## 2022-06-30 VITALS — BP 121/67 | HR 62

## 2022-06-30 DIAGNOSIS — R2681 Unsteadiness on feet: Secondary | ICD-10-CM

## 2022-06-30 DIAGNOSIS — R269 Unspecified abnormalities of gait and mobility: Secondary | ICD-10-CM | POA: Diagnosis not present

## 2022-06-30 NOTE — Therapy (Signed)
OUTPATIENT PHYSICAL THERAPY NEURO TREATMENT   Patient Name: Luis Herrera MRN: [id number] DOB:1940-05-17, 82 y.o., male Today's Date: 06/30/2022   PCP: Abbe Amsterdam, MD REFERRING PROVIDER: Pricilla Riffle, MD  END OF SESSION:  PT End of Session - 06/30/22 1232     Visit Number 5    Number of Visits 7    Date for PT Re-Evaluation 05/26/22    Authorization Type [employer]    PT Start Time 1231    PT Stop Time 1310    PT Time Calculation (min) 39 min    Equipment Utilized During Treatment Gait belt    Activity Tolerance Patient tolerated treatment well    Behavior During Therapy WFL for tasks assessed/performed             Past Medical History:  Diagnosis Date   Aortic stenosis 2008   Arthritis    Blood transfusion    Blood transfusion without reported diagnosis    CAD (coronary artery disease) 2008   Cataract    Complication of anesthesia    hallucinated once   H/O hiatal hernia    Heart murmur    AFTER REPLACED VALVE WENT AWAY   Hx of colonic polyps    Hyperlipidemia    Hypertension    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 Lynnette Caffey and Dr. Laneta Simmers   Thyroid disease    Past Surgical History:  Procedure Laterality Date   AORTIC VALVE REPLACEMENT  2009   [facility]   CARDIAC CATHETERIZATION  2008   CHOLECYSTECTOMY  1982   COLONOSCOPY     CORONARY ARTERY BYPASS GRAFT  2009   SVG-PDA w/ AVR   CORRECTION HAMMER TOE  2011   BILATERAL   GASTROCNEMIUS RECESSION  05/28/2011   INTRAOPERATIVE TRANSTHORACIC ECHOCARDIOGRAM N/A 09/30/2021   Procedure: INTRAOPERATIVE TRANSTHORACIC ECHOCARDIOGRAM;  Surgeon: Orbie Pyo, MD;  [facility];  Service: Open Heart Surgery;  Laterality: N/A;   POLYPECTOMY     RIGHT/LEFT HEART CATH AND CORONARY/GRAFT ANGIOGRAPHY N/A 09/24/2021   Procedure: RIGHT/LEFT HEART CATH AND CORONARY/GRAFT  ANGIOGRAPHY;  Surgeon: Orbie Pyo, MD;  [facility];  Service: Cardiovascular;  Laterality: N/A;   TONSILLECTOMY     TOTAL HIP ARTHROPLASTY Left 2000   TOTAL KNEE ARTHROPLASTY  2000   left   TOTAL KNEE ARTHROPLASTY  12/01/2011   Procedure: TOTAL KNEE ARTHROPLASTY;  Surgeon: Shelda Pal, MD;  Location: [facility];  Service: Orthopedics;  Laterality: Right;   TRANSCATHETER AORTIC VALVE REPLACEMENT, TRANSFEMORAL Right 09/30/2021   Procedure: Transcatheter Aortic Valve Replacement, Transfemoral;  Surgeon: Orbie Pyo, MD;  [facility];  Service: Open Heart Surgery;  Laterality: Right;   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   Procedure: TRANSURETHRAL RESECTION OF BLADDER TUMOR (TURBT) BILATERAL RETROGRADE PYELOGRAM;  Surgeon: Crista Elliot, MD;  Location: [facility];  Service: Urology;  Laterality: Bilateral;  45 MINS FOR CASE   WEIL OSTEOTOMY  05/28/2011   Patient Active Problem List   Diagnosis Date Noted   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   Acute diastolic heart failure (HCC) 09/19/2021   Severe aortic stenosis 09/18/2021   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, LEFT  10/17/2019   Anemia 09/12/2019   Frequent UTI 04/13/2018   Hypothyroidism  04/13/2018   Right bundle branch block 09/27/2017   CAD (coronary artery disease) 09/27/2017   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/2017   Acquired claw toe of right foot 06/04/2016   Class 1 obesity with body mass index (BMI) of 31.0 to 31.9 in adult 12/02/2011   Back pain of thoracolumbar region 09/01/2010   Hyperlipidemia with target LDL less than 130 04/10/2010   Essential hypertension 04/10/2010   Aortic valve replaced 04/10/2010    ONSET DATE: 05/19/2022 (referral date)  REFERRING DIAG: I10 (ICD-10-CM) - Essential hypertension I10 (ICD-10-CM) - Hypertension, unspecified type I49.3 (ICD-10-CM) -  Frequent PVCs I25.10 (ICD-10-CM) - Coronary artery disease, unspecified vessel or lesion type, unspecified whether angina present, unspecified whether native or transplanted heart I50.42 (ICD-10-CM) - Chronic combined systolic and diastolic heart failure I35.0 ([id number]) - Nonrheumatic aortic valve stenosis I25.10 (ICD-10-CM) - Coronary artery disease involving native coronary artery of native heart without angina pectoris I50.31 (ICD-10-CM) - Acute diastolic heart failure Z79.899 (ICD-10-CM) - Medication management R26.89 (ICD-10-CM) - Imbalance  THERAPY DIAG:  Abnormality of gait and mobility  Unsteadiness on feet  Rationale for Evaluation and Treatment: Rehabilitation  SUBJECTIVE:                                                                                                                                                                                             SUBJECTIVE STATEMENT: Patient reports no acute changes. Patient denies falls/near falls. Patient is open to working on anything today.   Pt accompanied by: self  PERTINENT HISTORY: CAD (s/p CABG with SVG to PDA, 2009), AV dz (s/p AVR in 2009), HTN, diastolic dysfunction and PVCs, reports most recent heart surgery in August 2023  PAIN:  Are you having pain? No  PRECAUTIONS: Fall  WEIGHT BEARING RESTRICTIONS: No  FALLS: Has patient fallen in last 6 months? Yes. Number of falls 1 fall into hospital and 1 time near fall when in church and someone had to catch him due to feeling lightheaded  LIVING ENVIRONMENT: Lives with: lives with their spouse Lives in: House/apartment - townhouse Stairs: Yes: Internal: 12 steps; on left going up and External: 8 steps; on right going up Has following equipment at home: Single point cane, Walker - 2 wheeled, and Grab bars  PLOF: Independent  PATIENT GOALS: "I guess my balance."  OBJECTIVE:   DIAGNOSTIC FINDINGS:   CT Angio Chest Aorta 09/19/2021:  IMPRESSION: 1. Vascular  findings and measurements pertinent to potential TAVR procedure, as detailed above. 2. Prior aortic valve replacement. 3. Severe aortoiliac atherosclerosis. Linear filling defect of the infrarenal abdominal aorta with associated contour abnormality, likely due to chronic focal  dissection. 4. Left main and 3 vessel coronary artery disease. 5. Pulmonary edema and small bilateral pleural effusions.  COGNITION: Overall cognitive status: Within functional limits for tasks assessed  TODAY'S TREATMENT:                                                                                                                               Vitals:   06/30/22 1237  BP: 121/67  Pulse: 62    NMR:  Standing on rocker board anterior posterior reaching for bean bags same side and toss x 10 tosses each hand (CGA-SBA) Standing on rocker board anterior posterior weight shifts EO 2 x 10 reps Standing on rocker board anterior posterior direction EC 3 x 30" Initially unable to complete independently and then after practicing anterior posterior weight shifts able to achieve 3 x 30" with EC Obstacle course 2 x 3 rounds: lateral stepping over balance beam, step over foam blocks, step up and down curb with airex pad on ground, up and down ramp on long blue foam as increased challenge (SBA-CGA)  PATIENT EDUCATION: Education details: Continue HEP Person educated: Patient Education method: Explanation Education comprehension: verbalized understanding  HOME EXERCISE PROGRAM: Access Code: [email or web address] URL: [email or web address] Date: 06/02/2022 Prepared by: Maryruth Eve  Exercises - Sit to Stand Without Arm Support  - 1 x daily - 7 x weekly - 3 sets - 5 reps - Tandem Stance  - 1 x daily - 7 x weekly - 3 sets - 30 hold - EC NBOS corner balance with horizontal head turns 3 x 10 turns  GOALS: Goals reviewed with patient? Yes  LONG TERM GOALS: Target date:  STG = LTG (due to POC length)  Patient will  report demonstrate independence with final HEP in order to maintain current gains and continue to progress after physical therapy discharge.  Baseline: To be provided Goal status: INITIAL  2.  Patient will improve mCTSIB score to 120/120 to indicate improved integration of vestibular, proprioceptive, and visual balance systems during static balance tasks in order to reduce risk for falls.   Baseline: 118/120; improved to 120/120 Goal status: MET  3.  Patient will improve score by 50 meters to demonstrate a clinically meaningful improvement in aerobic capacity and endurance needed for ambulating in the community.   Baseline: 968 feet or 295 meters with SBA (one trip on foot at end); 1035 feet or 315 meters with SBA - improved by 17 meters Goal status: MET  4.  Patient will improve gait speed to 0.89 m/s to indicate improvement to the level of community ambulator in order to participate more easily in activities outside of the home.   Baseline: 0.79 m/s with 12.59" with SBA Goal status: INITIAL  5.  Patient will improve miniBEST score to 23/28 or greater to indicate  improved static and dynamic stability.   Baseline: 18/28 Goal status: INITIAL  ASSESSMENT:  CLINICAL IMPRESSION: Session emphasized work on balance on compliant  surfaces with emphasis on ankle strategy. Patient improved in all activities with repetition especially EC anterior posterior rocker board challenge. Patient will benefit from skilled physical therapy services in order to maximize function and progress towards PLOF.   OBJECTIVE IMPAIRMENTS: Abnormal gait, cardiopulmonary status limiting activity, decreased activity tolerance, decreased balance, decreased endurance, decreased mobility, difficulty walking, decreased ROM, decreased strength, and improper body mechanics.   ACTIVITY LIMITATIONS: carrying, lifting, sitting, standing, squatting, stairs, transfers, and bed mobility  PARTICIPATION LIMITATIONS: community  activity and yard work  PERSONAL FACTORS: Age, Fitness, and 3+ comorbidities: see above  are also affecting patient's functional outcome.   REHAB POTENTIAL: Good  CLINICAL DECISION MAKING: Stable/uncomplicated  EVALUATION COMPLEXITY: Low  PLAN:  PT FREQUENCY: 1x/week  PT DURATION: 6 weeks  PLANNED INTERVENTIONS: Therapeutic exercises, Therapeutic activity, Neuromuscular re-education, Balance training, Gait training, Patient/Family education, Self Care, Joint mobilization, Manual therapy, and Re-evaluation  PLAN FOR NEXT SESSION: components of miniBEST, generalized strengthening/conditioning, work on endurance/gait, fall recovery as tolerated; fall recovery, balance on compliant surface, gait with head turns and on compliant surfaces. update HEP, work on  Carmelia Bake, PT, DPT 06/30/2022, 1:24 PM

## 2022-07-07 ENCOUNTER — Ambulatory Visit: Payer: Self-pay

## 2022-07-07 ENCOUNTER — Encounter: Payer: Self-pay | Admitting: Physical Therapy

## 2022-07-07 ENCOUNTER — Ambulatory Visit: Payer: Medicare Other | Admitting: Physical Therapy

## 2022-07-07 VITALS — BP 139/63 | HR 72

## 2022-07-07 DIAGNOSIS — R269 Unspecified abnormalities of gait and mobility: Secondary | ICD-10-CM

## 2022-07-07 DIAGNOSIS — R2681 Unsteadiness on feet: Secondary | ICD-10-CM

## 2022-07-07 NOTE — Patient Outreach (Signed)
  Care Coordination   Follow Up Visit Note   07/07/2022 Name: Luis Herrera MRN: [id number] DOB: [birth year]  Luis Herrera is a 82 y.o. year old male who sees Deeann Saint, MD for primary care. I spoke with  Luis Herrera by phone today.  What matters to the patients health and wellness today?  Follow up call with patient today.  Patient reports that he is doing well. Reports that BP has been in good control. States that he continues to take his medications as prescribed. Currently active with balance rehab.  States he tripped going into cardiology office.  Denies injury.   Reports no changes in medications.  States that he has good family support.     Goals Addressed             This Visit's Progress    Care Coordination Activities-Blood Pressure Management       Interventions Today    Flowsheet Row Most Recent Value  Chronic Disease   Chronic disease during today's visit Hypertension (HTN)  General Interventions   General Interventions Discussed/Reviewed General Interventions Reviewed, Doctor Visits  Doctor Visits Discussed/Reviewed Doctor Visits Reviewed  Education Interventions   Education Provided Provided Education  [Reviewed importance of taking medications daily, following low salt diet and being active.]  Provided Verbal Education On Nutrition, Labs, Exercise, Medication, When to see the doctor  Nutrition Interventions   Nutrition Discussed/Reviewed Decreasing salt  Pharmacy Interventions   Pharmacy Dicussed/Reviewed Medications and their functions  Safety Interventions   Safety Discussed/Reviewed Fall Risk  [Reviewed recent fall and cause of fall. Discussed balance rehab and how it is helping him.]      Reviewed BP monitoring and self management. Provided my contact information. Reviewed patients preference for follow up in 3 months. Encouraged patient to keep up the good work. Encouraged patient to be more observant to picking up his feet.  Confirmed family  support       Today's Vitals   07/07/22 1014  BP: 117/75    SDOH assessments and interventions completed:  No     Care Coordination Interventions:  Yes, provided   Follow up plan: Follow up call scheduled for 10/05/2022    Encounter Outcome:  Pt. Visit Completed   Rowe Pavy, RN, BSN, CEN [facility] Coordinator [phone]

## 2022-07-07 NOTE — Therapy (Signed)
OUTPATIENT PHYSICAL THERAPY NEURO TREATMENT / DISCHARGE   Patient Name: Luis Herrera MRN: [id number] DOB:[birth year], 82 y.o., male Today's Date: 07/07/2022   PCP: Abbe Amsterdam, MD REFERRING PROVIDER: Pricilla Riffle, MD  END OF SESSION:  PT End of Session - 07/07/22 1237     Visit Number 6    Number of Visits 7    Date for PT Re-Evaluation 05/26/22    Authorization Type [employer]    PT Start Time 1235    PT Stop Time 1315    PT Time Calculation (min) 40 min    Equipment Utilized During Treatment Gait belt    Activity Tolerance Patient tolerated treatment well    Behavior During Therapy [facility] for tasks assessed/performed             Past Medical History:  Diagnosis Date   Aortic stenosis 2008   Arthritis    Blood transfusion    Blood transfusion without reported diagnosis    CAD (coronary artery disease) 2008   Cataract    Complication of anesthesia    hallucinated once   H/O hiatal hernia    Heart murmur    AFTER REPLACED VALVE WENT AWAY   Hx of colonic polyps    Hyperlipidemia    Hypertension    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 Lynnette Caffey and Dr. Laneta Simmers   Thyroid disease    Past Surgical History:  Procedure Laterality Date   AORTIC VALVE REPLACEMENT  2009   Bioprosthetic at [facility]   CARDIAC CATHETERIZATION  2008   CHOLECYSTECTOMY  1982   COLONOSCOPY     CORONARY ARTERY BYPASS GRAFT  2009   SVG-PDA w/ AVR   CORRECTION HAMMER TOE  2011   BILATERAL   GASTROCNEMIUS RECESSION  05/28/2011   INTRAOPERATIVE TRANSTHORACIC ECHOCARDIOGRAM N/A 09/30/2021   Procedure: INTRAOPERATIVE TRANSTHORACIC ECHOCARDIOGRAM;  Surgeon: Orbie Pyo, MD;  Location: [facility] INVASIVE CV LAB;  Service: Open Heart Surgery;  Laterality: N/A;   POLYPECTOMY     RIGHT/LEFT HEART CATH AND CORONARY/GRAFT ANGIOGRAPHY N/A 09/24/2021   Procedure: RIGHT/LEFT HEART CATH AND  CORONARY/GRAFT ANGIOGRAPHY;  Surgeon: Orbie Pyo, MD;  Location: [facility] INVASIVE CV LAB;  Service: Cardiovascular;  Laterality: N/A;   TONSILLECTOMY     TOTAL HIP ARTHROPLASTY Left 2000   TOTAL KNEE ARTHROPLASTY  2000   left   TOTAL KNEE ARTHROPLASTY  12/01/2011   Procedure: TOTAL KNEE ARTHROPLASTY;  Surgeon: Shelda Pal, MD;  Location: [facility] ORS;  Service: Orthopedics;  Laterality: Right;   TRANSCATHETER AORTIC VALVE REPLACEMENT, TRANSFEMORAL Right 09/30/2021   Procedure: Transcatheter Aortic Valve Replacement, Transfemoral;  Surgeon: Orbie Pyo, MD;  Location: [facility] INVASIVE CV LAB;  Service: Open Heart Surgery;  Laterality: Right;   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   Procedure: TRANSURETHRAL RESECTION OF BLADDER TUMOR (TURBT) BILATERAL RETROGRADE PYELOGRAM;  Surgeon: Crista Elliot, MD;  Location: [facility] ORS;  Service: Urology;  Laterality: Bilateral;  45 MINS FOR CASE   WEIL OSTEOTOMY  05/28/2011   Patient Active Problem List   Diagnosis Date Noted   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   Acute diastolic heart failure (HCC) 09/19/2021   Severe aortic stenosis 09/18/2021   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, LEFT  10/17/2019   Anemia 09/12/2019   Frequent UTI 04/13/2018  Hypothyroidism 04/13/2018   Right bundle branch block 09/27/2017   CAD (coronary artery disease) 09/27/2017   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/2017   Acquired claw toe of right foot 06/04/2016   Class 1 obesity with body mass index (BMI) of 31.0 to 31.9 in adult 12/02/2011   Back pain of thoracolumbar region 09/01/2010   Hyperlipidemia with target LDL less than 130 04/10/2010   Essential hypertension 04/10/2010   Aortic valve replaced 04/10/2010    ONSET DATE: 05/19/2022 (referral date)  REFERRING DIAG: I10 (ICD-10-CM) - Essential hypertension I10 (ICD-10-CM) - Hypertension, unspecified type I49.3  (ICD-10-CM) - Frequent PVCs I25.10 (ICD-10-CM) - Coronary artery disease, unspecified vessel or lesion type, unspecified whether angina present, unspecified whether native or transplanted heart I50.42 (ICD-10-CM) - Chronic combined systolic and diastolic heart failure I35.0 (UEA-54-UJ) - Nonrheumatic aortic valve stenosis I25.10 (ICD-10-CM) - Coronary artery disease involving native coronary artery of native heart without angina pectoris I50.31 (ICD-10-CM) - Acute diastolic heart failure Z79.899 (ICD-10-CM) - Medication management R26.89 (ICD-10-CM) - Imbalance  THERAPY DIAG:  Abnormality of gait and mobility  Unsteadiness on feet  Rationale for Evaluation and Treatment: Rehabilitation  SUBJECTIVE:                                                                                                                                                                                             SUBJECTIVE STATEMENT: Patient arrives to session stating to related. Patient denies any falls since he was last here and no scuffs with his feet. Patient denies changes to medications.   Pt accompanied by: self  PERTINENT HISTORY: CAD (s/p CABG with SVG to PDA, 2009), AV dz (s/p AVR in 2009), HTN, diastolic dysfunction and PVCs, reports most recent heart surgery in August 2023  PAIN:  Are you having pain? No  PRECAUTIONS: Fall  WEIGHT BEARING RESTRICTIONS: No  FALLS: Has patient fallen in last 6 months? Yes. Number of falls 1 fall into hospital and 1 time near fall when in church and someone had to catch him due to feeling lightheaded  LIVING ENVIRONMENT: Lives with: lives with their spouse Lives in: House/apartment - townhouse Stairs: Yes: Internal: 12 steps; on left going up and External: 8 steps; on right going up Has following equipment at home: Single point cane, Walker - 2 wheeled, and Grab bars  PLOF: Independent  PATIENT GOALS: "I guess my balance."  OBJECTIVE:   DIAGNOSTIC FINDINGS:   CT  Angio Chest Aorta 09/19/2021:  IMPRESSION: 1. Vascular findings and measurements pertinent to potential TAVR procedure, as detailed above. 2. Prior aortic valve replacement. 3. Severe aortoiliac atherosclerosis. 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 4. Left main and 3 vessel coronary artery disease. 5. Pulmonary edema and small bilateral pleural effusions.  COGNITION: Overall cognitive status: Within functional limits for tasks assessed  TODAY'S TREATMENT:                                                                                                                               Vitals:   07/07/22 1248  BP: 139/63  Pulse: 72    TherAct:  Assessment of goals  [facility] PT Assessment - 07/07/22 0001       6 minute walk test results    Aerobic Endurance Distance Walked 1181   feet with modI (340 m)     Standardized Balance Assessment   Standardized Balance Assessment Mini-BESTest;10 meter walk test    10 Meter Walk 0.95 m/s   without AD (modI)     Mini-BESTest   Sit To Stand Normal: Comes to stand without use of hands and stabilizes independently.    Rise to Toes Normal: Stable for 3 s with maximum height.    Stand on one leg (left) Moderate: < 20 s    Stand on one leg (right) Moderate: < 20 s    Stand on one leg - lowest score 1    Compensatory Stepping Correction - Forward Normal: Recovers independently with a single, large step (second realignement is allowed).    Compensatory Stepping Correction - Backward Normal: Recovers independently with a single, large step    Compensatory Stepping Correction - Left Lateral Moderate: Several steps to recover equilibrium    Compensatory Stepping Correction - Right Lateral Moderate: Several steps to recover equilibrium    Stepping Corredtion Lateral - lowest score 1    Stance - Feet together, eyes open, firm surface  Normal: 30s    Stance -  Feet together, eyes closed, foam surface  Normal: 30s    Incline - Eyes Closed Normal: Stands independently 30s and aligns with gravity    Change in Gait Speed Normal: Significantly changes walkling speed without imbalance    Walk with head turns - Horizontal Normal: performs head turns with no change in gait speed and good balance    Walk with pivot turns Normal: Turns with feet close FAST (< 3 steps) with good balance.    Step over obstacles Normal: Able to step over box with minimal change of gait speed and with good balance.    Timed UP & GO with Dual Task Moderate: Dual Task affects either counting OR walking (>10%) when compared to the TUG without Dual Task.    Mini-BEST total score 25             PATIENT EDUCATION: Education details: Continue HEP, return to therapy if notice decline in function, functional progress over course of therapy Person educated: Patient Education method: Explanation Education comprehension: verbalized understanding  HOME EXERCISE PROGRAM: Access Code: [email or web address]  URL: [email or web address] Date: 06/02/2022 Prepared by: Maryruth Eve  Exercises - Sit to Stand Without Arm Support  - 1 x daily - 7 x weekly - 3 sets - 5 reps - Tandem Stance  - 1 x daily - 7 x weekly - 3 sets - 30 hold - EC NBOS corner balance with horizontal head turns 3 x 10 turns  GOALS: Goals reviewed with patient? Yes  LONG TERM GOALS: Target date:  STG = LTG (due to POC length)  Patient will report demonstrate independence with final HEP in order to maintain current gains and continue to progress after physical therapy discharge.  Baseline: To be provided; patient reports confidence in exercises at home Goal status: MET  2.  Patient will improve mCTSIB score to 120/120 to indicate improved integration of vestibular, proprioceptive, and visual balance systems during static balance tasks in order to reduce risk for falls.   Baseline: 118/120; improved to  120/120 Goal status: MET  3.  Patient will improve score by 50 meters to demonstrate a clinically meaningful improvement in aerobic capacity and endurance needed for ambulating in the community.   Baseline: 968 feet or 295 meters with SBA (one trip on foot at end); 1035 feet or 315 meters with SBA - improved by 17 meters; improved 1181 feet (340 meters) Goal status: MET  4.  Patient will improve gait speed to 0.89 m/s to indicate improvement to the level of community ambulator in order to participate more easily in activities outside of the home.   Baseline: 0.79 m/s with 12.59" with SBA; improved 0.95 m/s with modI Goal status: MET  5.  Patient will improve miniBEST score to 23/28 or greater to indicate  improved static and dynamic stability.   Baseline: 18/28; improved 25/28 Goal status: MET  ASSESSMENT:  CLINICAL IMPRESSION: Patient is discharging from physical therapy services due to achieving all of stated rehab goals in addition to feeling ready to discharge. Patient no longer at increased risk for falls as indicated by miniBEST and is an Microbiologist as indicated by . Patient also demonstrates confidence in HEP and improved cardiorespiratory endurance as indicated by . Patient still at an increased risk for falls as indicated by gait speed but demonstrates appropriate safety awareness and is picking up feet better functionally outside of PT. No further PT services indicated at this time.   OBJECTIVE IMPAIRMENTS: Abnormal gait, cardiopulmonary status limiting activity, decreased activity tolerance, decreased balance, decreased endurance, decreased mobility, difficulty walking, decreased ROM, decreased strength, and improper body mechanics.   ACTIVITY LIMITATIONS: carrying, lifting, sitting, standing, squatting, stairs, transfers, and bed mobility  PARTICIPATION LIMITATIONS: community activity and yard work  PERSONAL FACTORS: Age, Fitness, and 3+  comorbidities: see above  are also affecting patient's functional outcome.   REHAB POTENTIAL: Good  CLINICAL DECISION MAKING: Stable/uncomplicated  EVALUATION COMPLEXITY: Low  PLAN:  PT FREQUENCY: 1x/week  PT DURATION: 6 weeks  PLANNED INTERVENTIONS: Therapeutic exercises, Therapeutic activity, Neuromuscular re-education, Balance training, Gait training, Patient/Family education, Self Care, Joint mobilization, Manual therapy, and Re-evaluation  PLAN FOR NEXT SESSION: not indicated - D/C  Carmelia Bake, PT, DPT 07/07/2022, 1:19 PM  PHYSICAL THERAPY DISCHARGE SUMMARY  Visits from Start of Care: 6  Current functional level related to goals / functional outcomes: Achieved all goals as noted above   Remaining deficits: Increased risk for falls as indicated by gait speed/appropriate safety awareness   Education / Equipment: Progress towards goals, continue HEP, return with new referral if notice  decline   Patient agrees to discharge. Patient goals were met. Patient is being discharged due to meeting the stated rehab goals.

## 2022-07-14 ENCOUNTER — Ambulatory Visit: Payer: Medicare Other | Admitting: Physical Therapy

## 2022-07-28 ENCOUNTER — Ambulatory Visit: Payer: Medicare Other | Admitting: Physical Therapy

## 2022-08-03 ENCOUNTER — Encounter: Payer: Self-pay | Admitting: Internal Medicine

## 2022-08-04 ENCOUNTER — Ambulatory Visit: Payer: Medicare Other | Admitting: Physical Therapy

## 2022-08-04 ENCOUNTER — Encounter: Payer: Self-pay | Admitting: Internal Medicine

## 2022-08-05 ENCOUNTER — Telehealth: Payer: Self-pay | Admitting: Internal Medicine

## 2022-08-05 NOTE — Telephone Encounter (Signed)
See phone note .Luis Herrera

## 2022-08-05 NOTE — Telephone Encounter (Signed)
Pt has some Entresto 49/51 mg and was asking If could take 1 tab daily Instructed to keep and at some point may need that strength if Entresto is increased Pt verbalizes understanding ./cy

## 2022-08-05 NOTE — Telephone Encounter (Signed)
Pt c/o medication issue:  1. Name of Medication:   sacubitril-valsartan (ENTRESTO) 24-26 MG    2. How are you currently taking this medication (dosage and times per day)?  Take 1 tablet by mouth 2 (two) times daily.       3. Are you having a reaction (difficulty breathing--STAT)? No  4. What is your medication issue? Pt would like a callback regarding the way he can take this medication and other dosage options. Please advise

## 2022-08-06 ENCOUNTER — Telehealth: Payer: Self-pay | Admitting: Internal Medicine

## 2022-08-06 NOTE — Telephone Encounter (Signed)
Pt c/o medication issue:  1. Name of Medication: Sherryll Burger   How are you currently taking this medication (dosage and times per day)?   3. Are you having a reaction (difficulty breathing--STAT)?   4. What is your medication issue?  Iasked pt what he needed about this medicine. He said he needs to ask the nurse a question about the medicine

## 2022-08-06 NOTE — Telephone Encounter (Addendum)
Spoke to the Luis Herrera, stated he currently has handicap sticker however, will like for Dr Tenny Craw to complete documentation for handicap license plate. Luis Herrera stated he is on the way to the office to drop off the documents.  Will forward to MD and nurse.

## 2022-08-06 NOTE — Telephone Encounter (Signed)
Pw dropped of for handicap license plate. Please call patient when completed [phone]. Pw will be in providers box by eod.

## 2022-08-07 ENCOUNTER — Encounter: Payer: Self-pay | Admitting: Internal Medicine

## 2022-08-11 ENCOUNTER — Ambulatory Visit: Payer: Medicare Other | Admitting: Physical Therapy

## 2022-08-13 NOTE — Telephone Encounter (Signed)
Handicap form signed and placed in folder at front desk for patient pick-up. Patient notified.

## 2022-08-17 ENCOUNTER — Telehealth: Payer: Self-pay | Admitting: Internal Medicine

## 2022-08-17 NOTE — Telephone Encounter (Signed)
Pt c/o medication issue:  1. Name of Medication:   sacubitril-valsartan (ENTRESTO) 24-26 MG   2. How are you currently taking this medication (dosage and times per day)?   As prescribed  3. Are you having a reaction (difficulty breathing--STAT)?   No  4. What is your medication issue?   Patient stated he is running out of this medication and he is in the "donut hole".  Patient wants advice on next steps.

## 2022-08-17 NOTE — Telephone Encounter (Signed)
Sent my chart message and will forward to Dr. Charlott Rakes nurse and patient assistance nurse.

## 2022-08-18 ENCOUNTER — Ambulatory Visit: Payer: Medicare Other | Admitting: Physical Therapy

## 2022-08-18 ENCOUNTER — Telehealth: Payer: Self-pay | Admitting: Pharmacist

## 2022-08-18 ENCOUNTER — Encounter: Payer: Self-pay | Admitting: Internal Medicine

## 2022-08-18 NOTE — Telephone Encounter (Signed)
Pt is in the donut hole, he is going out of town this Sunday for 2 weeks and cannot afford the Entresto 24/26.... he has already used the 30 day free... is there anything else I can do... he says he tried pt assisatnce before and was declined

## 2022-08-18 NOTE — Telephone Encounter (Addendum)
I called the pt to let him know that we do not have samples  Please Honor Card patient is presenting for Luis Herrera: [id number]; Luis Herrera: [id number]; RXPCN: [id number]; RXID: [id number]

## 2022-08-18 NOTE — Telephone Encounter (Signed)
Start 50 losartan  May need to increase if BP high

## 2022-08-18 NOTE — Telephone Encounter (Signed)
See phone encounter.

## 2022-08-18 NOTE — Telephone Encounter (Signed)
Pt advised that I spoke with our PharmD... she will enroll him in the grant to help with his cost for the [facility].

## 2022-08-18 NOTE — Telephone Encounter (Signed)
Luis Herrera is cost prohibitive enrolled [facility] in [facility]

## 2022-08-21 DIAGNOSIS — I70203 Unspecified atherosclerosis of native arteries of extremities, bilateral legs: Secondary | ICD-10-CM | POA: Diagnosis not present

## 2022-08-21 DIAGNOSIS — D2372 Other benign neoplasm of skin of left lower limb, including hip: Secondary | ICD-10-CM | POA: Diagnosis not present

## 2022-08-21 DIAGNOSIS — M21961 Unspecified acquired deformity of right lower leg: Secondary | ICD-10-CM | POA: Diagnosis not present

## 2022-08-21 DIAGNOSIS — M21962 Unspecified acquired deformity of left lower leg: Secondary | ICD-10-CM | POA: Diagnosis not present

## 2022-08-21 DIAGNOSIS — B351 Tinea unguium: Secondary | ICD-10-CM | POA: Diagnosis not present

## 2022-08-25 ENCOUNTER — Ambulatory Visit: Payer: Medicare Other | Admitting: Physical Therapy

## 2022-09-09 DIAGNOSIS — H40013 Open angle with borderline findings, low risk, bilateral: Secondary | ICD-10-CM | POA: Diagnosis not present

## 2022-09-09 DIAGNOSIS — H353131 Nonexudative age-related macular degeneration, bilateral, early dry stage: Secondary | ICD-10-CM | POA: Diagnosis not present

## 2022-09-24 NOTE — Progress Notes (Deleted)
HEART AND VASCULAR CENTER   MULTIDISCIPLINARY HEART VALVE CLINIC                                     Cardiology Office Note:    Date:  09/24/2022   ID:  Luis Herrera, DOB [birth year], MRN [id number]  PCP:  Deeann Saint, MD  [facility] Cardiologist:  Dietrich Pates, MD  [facility] Electrophysiologist:  None   Referring MD: Deeann Saint, MD   1 year s/p TAVR  History of Present Illness:    Luis Herrera is a 82 y.o. 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    Luis Herrera is followed by Dr. Tenny Craw for his cardiology care. He underwent CABGx1 with AVR in 2009 while still living in [place]. His symptoms were similar at that time with SOB and fatigue prompting a cardiac catheterization and subsequent surgery. After moving to [place], he was followed at [facility] however transitioned to [facility] in 2015. His AVR has been followed with surveillance echocardiograms with stable gradients noted, mostly ranging in the 18-22 mmHg range.    He developed acute CHF and repeat echocardiogram 09/18/21 showed normal LVEF and increased AV gradients with a mean at 38 mmHg and peak at 69.36mmHg, and AVA by VTI at 0.87.  [facility] 09/24/21 showed a patent SVG--> PDA. He underwent successful TAVR with a 23 mm Edwards Sapien 3 UR via the TF  approach on 09/30/21. Post operative echo showed EF 45-50%, mild RV dysfunction, normally functioning VIV TAVR with a mean gradient of 19 mmHg and mild PVL as well as moderate MAC with mild MR/MS. His ASA was restarted.   Today the patient presents to clinic for follow up.   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 Lynnette Caffey and Dr. Laneta Simmers   Thyroid disease     Past Surgical History:  Procedure Laterality Date   AORTIC VALVE REPLACEMENT  2009   Bioprosthetic at [facility]   CARDIAC CATHETERIZATION  2008   CHOLECYSTECTOMY  1982   COLONOSCOPY     CORONARY ARTERY BYPASS GRAFT  2009   SVG-PDA w/ AVR   CORRECTION HAMMER TOE  2011   BILATERAL   GASTROCNEMIUS RECESSION  05/28/2011   INTRAOPERATIVE TRANSTHORACIC ECHOCARDIOGRAM N/A 09/30/2021   Procedure: INTRAOPERATIVE TRANSTHORACIC ECHOCARDIOGRAM;  Surgeon: Orbie Pyo, MD;  Location: [facility] INVASIVE CV LAB;  Service: Open Heart Surgery;  Laterality: N/A;   POLYPECTOMY     RIGHT/LEFT HEART CATH AND CORONARY/GRAFT ANGIOGRAPHY N/A 09/24/2021   Procedure: RIGHT/LEFT HEART CATH AND CORONARY/GRAFT ANGIOGRAPHY;  Surgeon: Orbie Pyo, MD;  Location: [facility] INVASIVE CV LAB;  Service: Cardiovascular;  Laterality: N/A;   TONSILLECTOMY     TOTAL HIP ARTHROPLASTY Left 2000   TOTAL KNEE ARTHROPLASTY  2000   left   TOTAL KNEE ARTHROPLASTY  12/01/2011   Procedure: TOTAL KNEE ARTHROPLASTY;  Surgeon: Shelda Pal, MD;  Location: [facility] ORS;  Service: Orthopedics;  Laterality: Right;   TRANSCATHETER AORTIC VALVE REPLACEMENT, TRANSFEMORAL Right 09/30/2021   Procedure: Transcatheter Aortic Valve Replacement, Transfemoral;  Surgeon: Orbie Pyo, MD;  Location: [facility] INVASIVE CV LAB;  Service: Open Heart Surgery;  Laterality: Right;   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   Procedure: TRANSURETHRAL RESECTION OF BLADDER TUMOR (TURBT) BILATERAL RETROGRADE PYELOGRAM;  Surgeon: Crista Elliot, MD;  Location: [facility]  ORS;  Service: Urology;  Laterality: Bilateral;  45 MINS FOR CASE   WEIL OSTEOTOMY  05/28/2011    Current Medications: No outpatient medications have been marked as taking for the 09/25/22 encounter (Appointment) with [facility].     Allergies:   Sulfa antibiotics and Bactrim [sulfamethoxazole-trimethoprim]   Social History   Socioeconomic History   Marital status: Married    Spouse name: Not on file   Number of children: 2   Years of education: 12   Highest education level: High school graduate  Occupational History   Occupation: retired  Tobacco Use   Smoking status: Former    Current packs/day: 0.00    Types: Cigarettes    Quit date: 05/25/1973    Years since quitting: 49.3   Smokeless tobacco: Never  Vaping Use   Vaping status: Never Used  Substance and Sexual Activity   Alcohol use: Not Currently    Comment: rare   Drug use: No   Sexual activity: Not Currently  Other Topics Concern   Not on file  Social History Narrative   Regular exercise-yes. Pt lives in [facility] condo with his wife.   Social Determinants of Health   Financial Resource Strain: Low Risk  (01/21/2022)   Overall Financial Resource Strain (CARDIA)    Difficulty of Paying Living Expenses: Not hard at all  Food Insecurity: No Food Insecurity (01/21/2022)   Hunger Vital Sign    Worried About Running Out of Food in the Last Year: Never true    Ran Out of Food in the Last Year: Never true  Transportation Needs: No Transportation Needs (01/21/2022)   PRAPARE - Administrator, Civil Service (Medical): No    Lack of Transportation (Non-Medical): No  Physical Activity: Sufficiently Active (04/08/2022)   Exercise Vital Sign    Days of Exercise per Week: 4 days    Minutes of Exercise per Session: 50 min  Stress: Not on file  Social Connections: Not on file     Family History: The patient's family history includes Colon cancer (age of onset: 85) in his mother; Heart  attack in his brother and father; Hyperlipidemia in an other family member; Hypertension in his brother, sister, and another family member. There is no history of Stroke, Esophageal cancer, Rectal cancer, or Stomach cancer.  ROS:   Please see the history of present illness.    All other systems reviewed and are negative.  EKGs/Labs/Other Studies Reviewed:    Cardiac Studies & Procedures   CARDIAC CATHETERIZATION  CARDIAC CATHETERIZATION 09/24/2021  Narrative 1.  Patent vein graft to PDA and minimal obstructive disease of native left circulation.  The native right coronary artery was not imaged. 2.  Cardiac output of 8.8 L/min and cardiac index of 4.0 L/min/m with mean RA pressure of 5 mmHg and mean wedge pressure of 17 mmHg. 3.  Pulse pressure of near 100 mmHg with LVEDP of 24 to 28 mmHg across the respiratory cycle with significant bioprosthetic aortic valve regurgitation.  Recommendation: Will admit for medical stabilization.  Given elevation in creatinine and relatively normal wedge and RA pressures additional diuresis will be held for now and the patient will be monitored.  Findings Coronary Findings Diagnostic  Dominance: Co-dominant  Left Anterior Descending There is mild diffuse disease throughout the vessel.  Graft To RPDA  Intervention  No interventions have been documented.   STRESS TESTS  MYOCARDIAL PERFUSION IMAGING 11/24/2016  Narrative  Nuclear stress EF: 44%.  No change from baseline EKG with RBBB. There were frequent PVCs including ventricular couplets and bigeminal PVCs.  There is a defect present in the basal inferior, mid inferior and apical inferior location. This defect is fixed and likely related to diaphragmatic attenuation artifact but cannot rule out infarct. No ischemia noted.  This is an intermediate risk study due to LV dysfunction.  The left ventricular ejection fraction is moderately decreased (30-44%).   ECHOCARDIOGRAM  ECHOCARDIOGRAM  COMPLETE 10/31/2021  Narrative ECHOCARDIOGRAM REPORT    Patient Name:   Luis Herrera  Date of Exam: 10/31/2021 Medical Rec #:  [id number]     Height:       69.0 in Accession #:    [id number]    Weight:       196.4 lb Date of Birth:  05-01-1940      BSA:          2.050 m Patient Age:    81 years      BP:           124/70 mmHg Patient Gender: M             HR:           42 bpm. Exam Location:  [address]  Procedure: 2D Echo, Cardiac Doppler and Color Doppler  Indications:    Z95.2 S/p TAVR  History:        Patient has prior history of Echocardiogram examinations, most recent 10/01/2021. CAD, S/p TAVR (23mm S3 ViV), Arrythmias:PVC; Risk Factors:Hypertension. Aortic Valve: Edwards valve in valve is present in the aortic position. Procedure Date: 09/30/2021.  Sonographer:    Samule Ohm RDCS Referring Phys: [id number]  R   IMPRESSIONS   1. Left ventricular ejection fraction, by estimation, is 50 to 55%. The left ventricle has low normal function. The left ventricle demonstrates global hypokinesis. There is moderate left ventricular hypertrophy. Left ventricular diastolic parameters are consistent with Grade II diastolic dysfunction (pseudonormalization). Elevated left atrial pressure. 2. Right ventricular systolic function is normal. The right ventricular size is mildly enlarged. 3. Left atrial size was moderately dilated. 4. The mitral valve is degenerative. Mild to moderate mitral valve regurgitation. 5. Tricuspid valve regurgitation is mild to moderate. 6. The major aortic regurgitant jet appears to be perivalvular at 2-3 o'clock, but there may be an intravalvular component. Aortic valve gradients are higher, probably in part due to AI-related increased stroke volume. However, the dimensionless index has decreased, which may signify a true reduction in the effective valve area. The aortic valve has been repaired/replaced. Aortic valve regurgitation is moderate. There  is a Edwards valve in valve present in the aortic position. Procedure Date: 09/30/2021. Aortic regurgitation PHT measures 354 msec. Aortic valve mean gradient measures  28.0 mmHg. Aortic valve Vmax measures 3.64 m/s. Aortic valve acceleration time measures 100 msec.  Comparison(s): Aortic insufficiency has worsened and aortic valve gradients are higher. The aortic ejection jet remains relatively early peaking (acceleration time 100 ms). Suspect the pulsed Doppler sample volume is placed too deep in the ventricle, which leads to underestimation of the dimensionless index and the aortic valve area.  FINDINGS Left Ventricle: Left ventricular ejection fraction, by estimation, is 50 to 55%. The left ventricle has low normal function. The left ventricle demonstrates global hypokinesis. The left ventricular internal cavity size was normal in size. There is moderate left ventricular hypertrophy. Abnormal (paradoxical) septal motion consistent with post-operative status. Left ventricular diastolic parameters are consistent with Grade II diastolic dysfunction (pseudonormalization). Elevated left atrial pressure.  Right Ventricle: The right ventricular size is mildly enlarged. No increase in right ventricular wall thickness. Right ventricular systolic function is normal.  Left Atrium: Left atrial size was moderately dilated.  Right Atrium: Right atrial size was normal in size.  Pericardium: There is no evidence of pericardial effusion.  Mitral Valve: The mitral valve is degenerative in appearance. Mild to moderate mitral annular calcification. Mild to moderate mitral valve regurgitation, with centrally-directed jet.  Tricuspid Valve: The tricuspid valve is normal in structure. Tricuspid valve regurgitation is mild to moderate.  Aortic Valve: The major aortic regurgitant jet appears to be perivalvular at 2-3 o'clock, but there may be an intravalvular component. Aortic valve gradients are higher, probably  in part due to AI-related increased stroke volume. However, the dimensionless index has decreased, which may signify a true reduction in the effective valve area. The aortic valve has been repaired/replaced. Aortic valve regurgitation is moderate. Aortic regurgitation PHT measures 354 msec. Aortic valve mean gradient measures 28.0 mmHg. Aortic valve peak gradient measures 52.9 mmHg. Aortic valve area, by VTI measures 1.05 cm. There is a Edwards valve in valve present in the aortic position. Procedure Date: 09/30/2021.  Pulmonic Valve: The pulmonic valve was grossly normal. Pulmonic valve regurgitation is not visualized.  Aorta: The aortic root is normal in size and structure.  IAS/Shunts: No atrial level shunt detected by color flow Doppler.   LEFT VENTRICLE PLAX 2D LVIDd:         5.20 cm   Diastology LVIDs:         3.30 cm   LV e' medial:    4.97 cm/s LV PW:         1.40 cm   LV E/e' medial:  27.7 LV IVS:        1.40 cm   LV e' lateral:   9.04 cm/s LVOT diam:     2.24 cm   LV E/e' lateral: 15.2 LV SV:         96 LV SV Index:   47 LVOT Area:     3.94 cm   IVC IVC diam: 1.50 cm  LEFT ATRIUM              Index        RIGHT ATRIUM           Index LA diam:        4.70 cm  2.29 cm/m   RA Pressure: 3.00 mmHg LA Vol (A2C):   90.7 ml  44.24 ml/m  RA Area:     18.70 cm LA Vol (A4C):   114.0 ml 55.60 ml/m  RA Volume:   55.70 ml  27.17 ml/m LA Biplane Vol: 102.0 ml 49.75 ml/m AORTIC VALVE  AV Area (Vmax):    0.91 cm AV Area (Vmean):   0.98 cm AV Area (VTI):     1.05 cm AV Vmax:           363.60 cm/s AV Vmean:          247.600 cm/s AV VTI:            0.914 m AV Peak Grad:      52.9 mmHg AV Mean Grad:      28.0 mmHg LVOT Vmax:         83.77 cm/s LVOT Vmean:        61.841 cm/s LVOT VTI:          0.244 m LVOT/AV VTI ratio: 0.27 AI PHT:            354 msec  AORTA Ao Root diam: 4.30 cm Ao Asc diam:  3.60 cm  MITRAL VALVE                TRICUSPID VALVE MV Area (PHT): 2.36  cm     Estimated RAP:  3.00 mmHg MV Decel Time: 321 msec MV E velocity: 137.40 cm/s  SHUNTS MV A velocity: 152.20 cm/s  Systemic VTI:  0.24 m MV E/A ratio:  0.90         Systemic Diam: 2.24 cm  Mihai Croitoru MD Electronically signed by Thurmon Fair MD Signature Date/Time: 10/31/2021/4:35:57 PM    Final    MONITORS  LONG TERM MONITOR (3-14 DAYS) 05/28/2022  Narrative Patch Wear Time:  2 days and 23 hours (2024-04-02T12:42:54-0400 to 2024-04-05T12:41:07-398)  Impression Sinus rhythm   Rates 45 to 95 bpm  Average HR 60 bm.   Occasional PVCs  (2.8% total), rare PACs No significant arrhythmias  _____________________________________________________________________________________________  Patient had a min HR of 45 bpm, max HR of 95 bpm, and avg HR of 60 bpm. Predominant underlying rhythm was Sinus Rhythm. First Degree AV Block was present. Bundle Branch Block/IVCD was present. Isolated SVEs were rare (<1.0%), SVE Couplets were rare (<1.0%), and SVE Triplets were rare (<1.0%). Isolated VEs were occasional (2.8%, 7240), VE Couplets were rare (<1.0%, 65), and VE Triplets were rare (<1.0%, 2). Ventricular Bigeminy and Trigeminy were present.   CT SCANS  CT CORONARY MORPH W/CTA COR W/SCORE 11/11/2021  Addendum 11/14/2021  1:51 PM ADDENDUM REPORT: 11/14/2021 13:49  CLINICAL DATA:  Cardiac valve replacement, bioprosthetic, dysfunction suspected Aortic valve replacement (TAVR), pre-op eval  EXAM: Cardiac TAVR CT  TECHNIQUE: The patient was scanned on a Siemens Force 192 slice scanner. A 120 kV retrospective scan was triggered in the descending thoracic aorta at 111 HU's. Gantry rotation speed was 270 msecs and collimation was .9 mm. The 3D data set was reconstructed in 5% intervals of the R-R cycle. Systolic and diastolic phases were analyzed on a dedicated work station using MPR, MIP and VRT modes. The patient received OMNIPAQUE IOHEXOL 350 MG/ML SOLN of  contrast.  FINDINGS: Aortic Valve:  S/p valve-in-valve TAVR with a 23 mm Edwards Sapien 3 UR, placed within prior surgical 23 mm prosthesis.  Prosthesis appears overall well seated, with appropriate expansion and internal diameter of 22 mm. No valve dehiscence or rocking. TAVR prosthesis well apposed to the prior surgical prosthesis. No fistulas.  Normal leaflet thickness, no thrombus or pannus seen. No HALT or HAM.  Coronary Arteries: Normal coronary origin. Left dominance. The study was performed without use of NTG and insufficient for plaque evaluation. Coronary artery calcium seen in 3 vessel distribution.  Prior CABG with patent SVG to left PDA.  OTHER:  Atria: Mild left atrial dilation.  Left atrial appendage: No thrombus.  Mitral valve: Grossly normal, mild mitral annular calcifications.  Pulmonary artery: Moderately dilated.  Pulmonary veins: Normal anatomy.  IMPRESSION: 1. No evidence of HALT or HAM on TAVR prosthesis to explain increase in gradients. Normal leaflet thickness. No thrombus or pannus.  2. No evidence of valve dehiscence. TAVR prosthesis is well seated and well apposed to the prior surgical aortic valve prosthesis. No CT findings to suggest source of paravalvular regurgitation.   Electronically Signed By: [name] Brass M.D. On: 11/14/2021 13:49  Narrative EXAM: OVER-READ INTERPRETATION  CT CHEST  The following report is a limited chest CT over-read performed by radiologist Dr. Allegra Lai of [facility] on 11/11/2021. This over-read does not include interpretation of cardiac or coronary anatomy or pathology. The coronary CTA interpretation by the cardiologist is attached.  COMPARISON:  None Available.  FINDINGS: Vascular: Mild cardiomegaly. No pericardial effusion. Normal caliber thoracic aorta with moderate atherosclerotic disease. Prior transcatheter aortic valve replacement. No suspicious filling defects of the  main pulmonary arteries.  Mediastinum/Nodes: Esophagus is unremarkable. No pathologically enlarged lymph nodes seen in the chest.  Lungs/Pleura: Lungs are clear. No pleural effusion or pneumothorax.  Upper Abdomen: No acute abnormality.  Musculoskeletal: Prior median sternotomy with intact sternal wires. No aggressive appearing osseous lesion.  IMPRESSION: No acute extracardiac abnormality.  Electronically Signed: By: Allegra Lai M.D. On: 11/11/2021 12:48   CT SCANS  CT CORONARY MORPH W/CTA COR W/SCORE 09/19/2021  Addendum 09/22/2021  7:09 PM ADDENDUM REPORT: 09/22/2021 19:06  CLINICAL DATA:  S/p aortic valve replacement and CABG.  EXAM: Cardiac TAVR CT  TECHNIQUE: A non-contrast, gated CT scan was obtained with axial slices of 3 mm through the heart for aortic valve calcium scoring. A 90 kV retrospective, gated, contrast cardiac scan was obtained. Gantry rotation speed was 250 msecs and collimation was 0.6 mm. Nitroglycerin was not given. The 3D data set was reconstructed in 5% intervals of the 0-95% of the R-R cycle. Systolic and diastolic phases were analyzed on a dedicated workstation using MPR, MIP, and VRT modes. The patient received 100 cc of contrast.  FINDINGS: Image quality: Excellent.  Noise artifact is: Limited.  Aortic valve prosthesis: Morphology consistent with Carpentier Edwards Perimount bioprosthesis. Internal diameter suggests 23 mm valve. The leaflets are freely mobile without calcification. There is severe prolapse of the leaflet in the LCC. There is no evidence of paravalvular leak.  Valve in valve analysis: A valve in valve analysis was performed using a 23 mm S3 based on recommendations from the valve in valve application. The virtual valve to coronary (VTC) distance for the left main is 7.8 mm. The ostium of the RCA takes off above the virtual valve. The distance between the tip of the virtual valve is still 7.5 mm from the RCA  ostium. RCA appears to be non-dominant. Overall, low risk for coronary obstruction.  Optimal coplanar projection: LAO 4 CRA 16  Sinus of Valsalva Measurements:  Non-coronary: 37 mm  Right-coronary: 36 mm  Left-coronary: 37 mm  Sinotubular Junction: 28 mm  Ascending Thoracic Aorta: 34 mm  Coronary Arteries: Normal coronary origin. Left dominance. The study was performed without use of NTG and is insufficient for plaque evaluation. Please refer to recent cardiac catheterization for coronary assessment. There is severe 3-vessel coronary calcifications. 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  Cardiac Morphology:  Right Atrium: Right atrial  size is within normal limits. Contrast reflux into the IVC consistent with elevated RA pressure.  Right Ventricle: The right ventricular cavity is within normal limits.  Left Atrium: Left atrial size is normal in size with no left atrial appendage filling defect.  Left Ventricle: The ventricular cavity size is dilated.  Pulmonary arteries: Dilated pulmonary artery suggestive of pulmonary hypertension.  Pulmonary veins: Normal pulmonary venous drainage.  Pericardium: Normal thickness with no significant effusion or calcium present.  Mitral Valve: The mitral valve is normal structure with mild annular calcium.  Extra-cardiac findings: See attached radiology report for non-cardiac structures.  IMPRESSION: 1. 23 mm bioprosthetic aortic valve prosthesis (likely a CE Perimount). Severe prolapse of the leaflet of the valve in the LCC position.  2. Valve in valve analysis supports a 23 mm S3. Low-risk for coronary obstruction.  3. Optimal Fluoroscopic Angle for Delivery: LAO 4 CRA 16  4. Dilated pulmonary artery suggestive of pulmonary hypertension. Contrast reflux into the IVC consistent with elevated RA pressure.  5. Normal coronary origin. Left dominance. 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  Gerri Spore T. Flora Lipps,  MD   Electronically Signed By: Lennie Odor M.D. On: 09/22/2021 19:06  Narrative EXAM: OVER-READ INTERPRETATION  CT CHEST  The following report is a limited chest CT over-read performed by radiologist Dr. Allegra Lai of [facility] on 09/19/2021. This over-read does not include interpretation of cardiac or coronary anatomy or pathology. The cardiac TAVR interpretation by the cardiologist is attached.  COMPARISON:  None Available.  FINDINGS: Extracardiac findings will be described separately under dictation for contemporaneously obtained CTA chest, abdomen and pelvis.  IMPRESSION: Please see separate dictation for contemporaneously obtained CTA chest, abdomen and pelvis dated 09/19/2021 for full description of relevant extracardiac findings.  Electronically Signed: By: Allegra Lai M.D. On: 09/19/2021 15:55          EKG:  EKG is NOT ordered today.    Recent Labs: 10/01/2021: Magnesium 2.1 10/21/2021: NT-Pro BNP 2,114 05/19/2022: ALT 22; BUN 19; Creatinine, Ser 1.09; Hemoglobin 9.9; Platelets 267; Potassium 4.5; Sodium 143; TSH 3.770  Recent Lipid Panel    Component Value Date/Time   CHOL 158 05/19/2022 1248   TRIG 91 05/19/2022 1248   HDL 52 05/19/2022 1248   CHOLHDL 3.0 05/19/2022 1248   CHOLHDL 3 11/14/2015 0954   VLDL 16.8 11/14/2015 0954   LDLCALC 89 05/19/2022 1248     Risk Assessment/Calculations:        {This patient may be at risk for Amyloid.  Click HERE to open Cardiac Amyloid Screening SmartSet to order screening OR Click HERE to defer testing for 1 year or permanently :1}    Physical Exam:    VS:  There were no vitals taken for this visit.    Wt Readings from Last 3 Encounters:  05/19/22 202 lb 12.8 oz (92 kg)  03/17/22 205 lb (93 kg)  02/11/22 206 lb 6.4 oz (93.6 kg)     GEN: *** Well nourished, well developed in no acute distress HEENT: Normal NECK: No JVD LYMPHATICS: No lymphadenopathy CARDIAC: ***RRR, no  murmurs, rubs, gallops RESPIRATORY:  Clear to auscultation without rales, wheezing or rhonchi  ABDOMEN: Soft, non-tender, non-distended MUSCULOSKELETAL:  No edema; No deformity  SKIN: Warm and dry NEUROLOGIC:  Alert and oriented x 3 PSYCHIATRIC:  Normal affect   ASSESSMENT:    No diagnosis found. PLAN:    In order of problems listed above:  Severe AS s/p VIV TAVR: echo today shows  EF 50-55%, s/p TAVR with a mean gradient of 28 mm hg (up from ~75mm hg and worsening PVL. Discussed case with the team and plan to do cardiac CT to r/o thrombus. We feel the increase in gradients are most likely 2/2 increased flow from AI. Fortunately, he has had a big clinical improvement with NYHA class I symptoms. Will continue clinical surveillance if CT does not show thrombus. Continue on aspirin. Patient understands he must continue lifelong SBE prophylaxis.   Chronic combined S/D CHF: EF 50% today. Currently on Coreg 6.25mg  BID and Lasix 20mg  daily. Entresto had been on hold. Plan to make Lasix PRN and restart Entresto at lowest dose. BMET in 2 weeks.    CAD s/p CABG with SVG to PDA 2009: denies anginal symptoms. Continue ASA 81mg  QD, Lipitor 80mg  QD, carvedilol 6.25mg .   HTN: stable with no changes needed today.    PVCs: controlled on Amiodarone, follows with Dr. Ladona Ridgel.           Medication Adjustments/Labs and Tests Ordered: Current medicines are reviewed at length with the patient today.  Concerns regarding medicines are outlined above.  No orders of the defined types were placed in this encounter.  No orders of the defined types were placed in this encounter.   There are no Patient Instructions on file for this visit.   Signed, Cline Crock, PA-C  09/24/2022 9:20 PM    [facility]

## 2022-09-25 ENCOUNTER — Ambulatory Visit: Payer: Medicare Other

## 2022-09-25 ENCOUNTER — Other Ambulatory Visit (HOSPITAL_COMMUNITY): Payer: Medicare Other

## 2022-09-25 ENCOUNTER — Ambulatory Visit (HOSPITAL_COMMUNITY): Payer: Medicare Other

## 2022-09-25 DIAGNOSIS — Z952 Presence of prosthetic heart valve: Secondary | ICD-10-CM

## 2022-09-25 DIAGNOSIS — I10 Essential (primary) hypertension: Secondary | ICD-10-CM

## 2022-09-25 DIAGNOSIS — I2510 Atherosclerotic heart disease of native coronary artery without angina pectoris: Secondary | ICD-10-CM

## 2022-09-25 DIAGNOSIS — I493 Ventricular premature depolarization: Secondary | ICD-10-CM

## 2022-09-25 DIAGNOSIS — I5020 Unspecified systolic (congestive) heart failure: Secondary | ICD-10-CM

## 2022-09-28 ENCOUNTER — Encounter: Payer: Self-pay | Admitting: Internal Medicine

## 2022-09-30 ENCOUNTER — Encounter: Payer: Self-pay | Admitting: Internal Medicine

## 2022-10-01 ENCOUNTER — Telehealth: Payer: Self-pay | Admitting: Internal Medicine

## 2022-10-01 ENCOUNTER — Encounter (HOSPITAL_COMMUNITY): Payer: Self-pay

## 2022-10-01 ENCOUNTER — Other Ambulatory Visit: Payer: Self-pay

## 2022-10-01 ENCOUNTER — Encounter: Payer: Self-pay | Admitting: Internal Medicine

## 2022-10-01 ENCOUNTER — Ambulatory Visit: Payer: Medicare Other | Attending: Internal Medicine | Admitting: Internal Medicine

## 2022-10-01 VITALS — BP 93/54 | HR 73 | Ht 71.0 in | Wt 204.2 lb

## 2022-10-01 DIAGNOSIS — I5042 Chronic combined systolic (congestive) and diastolic (congestive) heart failure: Secondary | ICD-10-CM

## 2022-10-01 DIAGNOSIS — I2510 Atherosclerotic heart disease of native coronary artery without angina pectoris: Secondary | ICD-10-CM | POA: Diagnosis not present

## 2022-10-01 DIAGNOSIS — R2689 Other abnormalities of gait and mobility: Secondary | ICD-10-CM | POA: Diagnosis not present

## 2022-10-01 DIAGNOSIS — I350 Nonrheumatic aortic (valve) stenosis: Secondary | ICD-10-CM

## 2022-10-01 DIAGNOSIS — I10 Essential (primary) hypertension: Secondary | ICD-10-CM | POA: Diagnosis not present

## 2022-10-01 DIAGNOSIS — R42 Dizziness and giddiness: Secondary | ICD-10-CM

## 2022-10-01 DIAGNOSIS — Z79899 Other long term (current) drug therapy: Secondary | ICD-10-CM

## 2022-10-01 MED ORDER — CARVEDILOL 3.125 MG PO TABS: 3.1250 mg | ORAL_TABLET | Freq: Two times a day (BID) | ORAL | 3 refills | Status: DC

## 2022-10-01 MED ORDER — CARVEDILOL 3.125 MG PO TABS: 3.1250 mg | ORAL_TABLET | Freq: Two times a day (BID) | ORAL | Status: DC

## 2022-10-01 NOTE — Progress Notes (Signed)
Cardiology Office Note   Date:  10/01/2022   ID:  Luis Herrera, DOB 1940-04-16, MRN [id number]  PCP:  Deeann Saint, MD  Cardiologist:   Dietrich Pates, MD   Pt presents for assessment of dizziness     History of Present Illness: Luis Herrera is a 82 y.o. male with a history of CAD (s/p CABG with SVG to PDA, 2009), AV dz (s/p AVR in 2009), HTN, diastolic dysfunction and PVCs  Pt  is also followed by [facility]   Amiodarone is being used to suppress PVCs    I last saw the pt in July 2023   After this visit echo done    LVEF normal   AV prosthesis had a mean gradient of 38 mm Hg, increased from previous echo.  Dimensionless index was 0.23.  The pt went on to have evaluation by structural heart team.   Cardiac cath showed paent graft to PDA and minimal dz of L circulation.    Cardiac index 4 L/min.   Pulse pressure of nearly 100 mm Hg with LVEDP of 24 to 28 .  Pt admitted for stabilization.  On 09/30/21 he underwent TAVR (23 mm Sapien 3 Ultra Resilia valve)  Repeat echo in Sept 2023 showed increased mean gradient of 28 mm Hg (from 18 mm Hg)    Cardiac CT done showing no obstruction.  I saw the pt in [facility] in April 2024  He called in  yesterday complaining of weakness, dizziness He has not passed out  He denies F/C   No diarrhea.  NO CP   No palpitations Says he is eating OK      Current Meds  Medication Sig   amiodarone (PACERONE) 200 MG tablet TAKE ONE-HALF TABLET BY MOUTH  DAILY   amLODipine (NORVASC) 5 MG tablet TAKE 1 TABLET (5 MG TOTAL) BY MOUTH DAILY.   aspirin EC 81 MG tablet Take 1 tablet (81 mg total) by mouth daily. Swallow whole.   atorvastatin (LIPITOR) 80 MG tablet TAKE 1 TABLET BY MOUTH ONCE  DAILY   Calcium Carb-Cholecalciferol (CALCIUM PLUS VITAMIN D3 PO) Take 2 tablets by mouth daily. Gummy supplements   carvedilol (COREG) 6.25 MG tablet TAKE 1 TABLET BY MOUTH TWICE  DAILY   ezetimibe (ZETIA) 10 MG tablet Take 1 tablet (10 mg total) by mouth daily.   furosemide (LASIX)  20 MG tablet Take 1 tablet (20 mg total) by mouth daily.   levothyroxine (SYNTHROID) 50 MCG tablet TAKE 1 TABLET BY MOUTH DAILY  BEFORE BREAKFAST   Multiple Vitamins-Minerals (PRESERVISION AREDS 2) CAPS Take 1 capsule by mouth 2 (two) times daily.   nitrofurantoin, macrocrystal-monohydrate, (MACROBID) 100 MG capsule Take 100 mg by mouth at bedtime.   sacubitril-valsartan (ENTRESTO) 24-26 MG Take 1 tablet by mouth 2 (two) times daily.   tamsulosin (FLOMAX) 0.4 MG CAPS capsule Take 0.4 mg by mouth in the morning and at bedtime.     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 Lynnette Caffey and Dr. Laneta Simmers   Thyroid disease     Past Surgical History:  Procedure Laterality Date   AORTIC VALVE REPLACEMENT  2009   [facility]oprosthetic at [facility]   CARDIAC CATHETERIZATION  2008   CHOLECYSTECTOMY  1982   COLONOSCOPY     CORONARY ARTERY BYPASS GRAFT  2009   SVG-PDA w/ AVR   CORRECTION HAMMER TOE  2011   BILATERAL   GASTROCNEMIUS RECESSION  05/28/2011   INTRAOPERATIVE TRANSTHORACIC ECHOCARDIOGRAM N/A 09/30/2021   Procedure: INTRAOPERATIVE TRANSTHORACIC ECHOCARDIOGRAM;  Surgeon: Orbie Pyo, MD;  Location: [facility];  Service: Open Heart Surgery;  Laterality: N/A;   POLYPECTOMY     RIGHT/LEFT HEART CATH AND CORONARY/GRAFT ANGIOGRAPHY N/A 09/24/2021   Procedure: RIGHT/LEFT HEART CATH AND CORONARY/GRAFT ANGIOGRAPHY;  Surgeon: Orbie Pyo, MD;  Location:  [facility];  Service: Cardiovascular;  Laterality: N/A;   TONSILLECTOMY     TOTAL HIP ARTHROPLASTY Left 2000   TOTAL KNEE ARTHROPLASTY  2000   left   TOTAL KNEE ARTHROPLASTY  12/01/2011   Procedure: TOTAL KNEE ARTHROPLASTY;  Surgeon: Shelda Pal, MD;  Location: [facility] OR[facility];  Service: Orthopedics;  Laterality: Right;   TRANSCATHETER AORTIC VALVE REPLACEMENT, TRANSFEMORAL Right 09/30/2021   Procedure: Transcatheter Aortic Valve Replacement, Transfemoral;  Surgeon: Orbie Pyo, MD;  Location: [facility];  Service: Open Heart Surgery;  Laterality: Right;   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 His smoking use included cigarettes. He has never used smokeless tobacco. He reports that he does not currently use alcohol. He reports that he does not use drugs.   Family History:  The patient's family history includes Colon cancer (age of onset: 97) in his mother; Heart attack in his brother and father; Hyperlipidemia in an other family member; Hypertension in his brother, sister, and another family member.    ROS:  Please see the history of present illness. All other systems are reviewed and  Negative to the above problem except as noted.    PHYSICAL EXAM: VS:  BP (!) 93/54 (BP Location: Right Arm, Cuff Size: Normal)   Pulse 73   Ht 5\' 11"  (1.803 m)   Wt 204 lb 3.2 oz (92.6 kg)   SpO2 96%   BMI 28.48 kg/m    PB laying 122/65  P 62  (patient dizzy)  SItting  106/52  P 66    Standing 83/47  P 73   Standing 4 min  93/54  P 73   GEN: Obese 82 year-old in no acute distress  HEENT: normal  Neck: JVP is normal   Cardiac: RRR; Gr II/VI systolic  murmur base  Trace LE edema  Respiratory:  clear to auscultation GI: soft, nontender, nondistended, No hepatomegaly    EKG:  EKG SR  RBBB  Normal PR interval     CT   Sept 2024   1. No evidence of HALT or HAM on TAVR prosthesis to explain increase in gradients. Normal leaflet thickness. No thrombus or pannus.  2. No evidence of valve dehiscence. TAVR prosthesis is well seated and well apposed to the prior surgical aortic valve prosthesis. No CT findings to suggest source of paravalvular regurgitation.    Echo  Sept   2023   1. Left ventricular ejection fraction, by estimation, is 50 to 55%. The  left ventricle has low normal function. The left ventricle demonstrates  global hypokinesis. There is moderate left ventricular hypertrophy. Left  ventricular diastolic parameters are   consistent with Grade II diastolic dysfunction (pseudonormalization).  Elevated left atrial pressure.   2. Right ventricular systolic function is normal. The right ventricular  size is mildly enlarged.   3. Left atrial size was moderately dilated.   4. The mitral valve is degenerative. Mild to moderate mitral valve  regurgitation.   5. Tricuspid valve regurgitation is mild to moderate.   6. The major aortic regurgitant jet appears to be perivalvular at 2-3  o'clock, but there may be an intravalvular component. Aortic valve  gradients are higher, probably in part due to AI-related increased stroke  volume. However, the dimensionless index  has decreased, which may signify a true reduction in the effective valve  area. The aortic valve has been repaired/replaced. Aortic valve  regurgitation is moderate. There is a Edwards valve in valve present in  the aortic position. Procedure Date:  09/30/2021. Aortic regurgitation PHT measures 354 msec. Aortic valve mean  gradient measures 28.0 mmHg. Aortic valve Vmax measures 3.64 m/s. Aortic  valve acceleration time measures 100 msec.   Comparison(s): Aortic  insufficiency has worsened and aortic valve  gradients are higher. The aortic ejection jet remains relatively early  peaking (acceleration time 100 ms). Suspect the pulsed Doppler sample  volume is placed too deep in the ventricle,  which leads to underestimation of the dimensionless index and the aortic  valve area.    [place]    01/09/20  Study Highlights  1. NSR with sinus brady and sinus tachycardia 2. Frequent PVC's and PAC's 3. NS AT 4. Rare triplets 5. Bigeminal and trigeminal PVC's  Lipid Panel    Component Value Date/Time   CHOL 158 05/19/2022 1248   TRIG 91 05/19/2022 1248   HDL 52 05/19/2022 1248   CHOLHDL 3.0 05/19/2022 1248   CHOLHDL 3 11/14/2015 0954   VLDL 16.8 11/14/2015 0954   LDLCALC 89 05/19/2022 1248      Wt Readings from Last 3 Encounters:  10/01/22 204 lb 3.2 oz (92.6 kg)  05/19/22 202 lb 12.8 oz (92 kg)  03/17/22 205 lb (93 kg)      ASSESSMENT AND PLAN:   1  Dizziness/weakness   THe pt is orthostatic on exam today I am not sure what caused the change   No recent illness  Eating/drinking OK Will check labs (CBC, CMET, UA, BNP, ESR) I would set up for echo given TAVR and previous eco findings  Stop amlodipine Cut carvedilol in 1/2 Stop Entresto for now   2  AV dz   S/p TAVR in Aug 2023  Echo last fall showed increased gradient, and mod AI   CT with evid of HALT Will repeat   2  CAD   Cath prior to procedure showed patient graft to RCA    Pt denies CP     3  HTN  PRevious HTN  Follow off of meds   4  HL  LDL 89  HDL 52  Will review on return  Keep on same meds   5  CAD  Mild dz of carotids  6  Hx of PVCs  On AMiodarone   Follows with EP    Current medicines are reviewed at length with the patient today.  The patient does not have concerns regarding medicines.  Signed, Dietrich Pates, MD  10/01/2022 9:06 AM    [facility] [address] [place], [place], [place]  [address] Phone: [phone]; Fax: [phone]

## 2022-10-01 NOTE — Patient Instructions (Addendum)
Medication Instructions:   STOP AMLODIPINE CUT CARVEDILOL IN 1/2  HOLD ENTRESTO TONIGHT AND RESTART TOMORROW  KEEP AN EYE ON YOUR BP   *If you need a refill on your cardiac medications before your next appointment, please call your pharmacy*   Lab Work: CMET,CBC, UA, PRO BNP, ESR TODAY If you have labs (blood work) drawn today and your tests are completely normal, you will receive your results only by: MyChart Message (if you have MyChart) OR A paper copy in the mail If you have any lab test that is abnormal or we need to change your treatment, we will call you to review the results.   Testing/Procedures: ASAP ANY LOCATION ALREADY ON FOR LATER IN THE MONTH NEED SOONER   Your physician has requested that you have an echocardiogram. Echocardiography is a painless test that uses sound waves to create images of your heart. It provides your doctor with information about the size and shape of your heart and how well your heart's chambers and valves are working. This procedure takes approximately one hour. There are no restrictions for this procedure. Please do NOT wear cologne, perfume, aftershave, or lotions (deodorant is allowed). Please arrive 15 minutes prior to your appointment time.    Follow-Up: At [facility], you and your health needs are our priority.  As part of our continuing mission to provide you with exceptional heart care, we have created designated Provider Care Teams.  These Care Teams include your primary Cardiologist (physician) and Advanced Practice Providers (APPs -  Physician Assistants and Nurse Practitioners) who all work together to provide you with the care you need, when you need it.  We recommend signing up for the patient portal called "MyChart".  Sign up information is provided on this After Visit Summary.  MyChart is used to connect with patients for Virtual Visits (Telemedicine).  Patients are able to view lab/test results, encounter notes, upcoming  appointments, etc.  Non-urgent messages can be sent to your provider as well.   To learn more about what you can do with MyChart, go to [email or web address].    Your next appointment:  WILL PLAN AFTER TESTING

## 2022-10-01 NOTE — Telephone Encounter (Signed)
  Pt c/o medication issue:  1. Name of Medication: carvedilol (COREG) 3.125 MG tablet   2. How are you currently taking this medication (dosage and times per day)?   Take 1 tablet (3.125 mg total) by mouth 2 (two) times daily.   3. Are you having a reaction (difficulty breathing--STAT)? No   4. What is your medication issue? The patient mentioned that Dr. Tenny Craw reduced his carvedilol dosage to 3.125 mg. However, this morning he took 5 mg. He would like to know whether he should take the 3.125 mg dose this evening or begin the new dosage tomorrow? He asked if Dewayne Hatch can call him back as soon as possible since he is ready to take his medication.

## 2022-10-01 NOTE — Telephone Encounter (Signed)
I spoke with the pt and advised his med changes from this morning.

## 2022-10-02 ENCOUNTER — Ambulatory Visit (HOSPITAL_COMMUNITY): Payer: Medicare Other | Attending: Physician Assistant

## 2022-10-02 ENCOUNTER — Ambulatory Visit: Payer: Medicare Other | Admitting: Internal Medicine

## 2022-10-02 DIAGNOSIS — Z952 Presence of prosthetic heart valve: Secondary | ICD-10-CM | POA: Diagnosis not present

## 2022-10-02 DIAGNOSIS — N390 Urinary tract infection, site not specified: Secondary | ICD-10-CM

## 2022-10-02 LAB — ECHOCARDIOGRAM COMPLETE
AR max vel: 1.21 cm2
AV Area VTI: 1.27 cm2
AV Area mean vel: 1.2 cm2
AV Mean grad: 20 mmHg
AV Peak grad: 37.5 mmHg
Ao pk vel: 3.06 m/s
Area-P 1/2: 2.11 cm2
Est EF: 50
MV M vel: 5.08 m/s
MV Peak grad: 103.2 mmHg
S' Lateral: 3.3 cm

## 2022-10-02 LAB — SEDIMENTATION RATE: Sed Rate: 16 mm/h (ref 0–30)

## 2022-10-02 LAB — CBC
Hematocrit: 30.4 % — ABNORMAL LOW (ref 37.5–51.0)
Hemoglobin: 9.9 g/dL — ABNORMAL LOW (ref 13.0–17.7)
MCH: 31 pg (ref 26.6–33.0)
MCHC: 32.6 g/dL (ref 31.5–35.7)
MCV: 95 fL (ref 79–97)
Platelets: 247 10*3/uL (ref 150–450)
RBC: 3.19 x10E6/uL — ABNORMAL LOW (ref 4.14–5.80)
RDW: 13.1 % (ref 11.6–15.4)
WBC: 8 10*3/uL (ref 3.4–10.8)

## 2022-10-02 LAB — MICROSCOPIC EXAMINATION
Bacteria, UA: NONE SEEN
Casts: NONE SEEN /LPF
Epithelial Cells (non renal): NONE SEEN /HPF (ref 0–10)
WBC, UA: >30 /HPF — AB (ref 0–5)

## 2022-10-02 LAB — COMPREHENSIVE METABOLIC PANEL
ALT: 17 IU/L (ref 0–44)
AST: 28 IU/L (ref 0–40)
Albumin: 3.8 g/dL (ref 3.7–4.7)
Alkaline Phosphatase: 114 IU/L (ref 44–121)
BUN/Creatinine Ratio: 28 — ABNORMAL HIGH (ref 10–24)
BUN: 32 mg/dL — ABNORMAL HIGH (ref 8–27)
Bilirubin Total: 0.9 mg/dL (ref 0.0–1.2)
CO2: 24 mmol/L (ref 20–29)
Calcium: 8.7 mg/dL (ref 8.6–10.2)
Chloride: 106 mmol/L (ref 96–106)
Creatinine, Ser: 1.15 mg/dL (ref 0.76–1.27)
Globulin, Total: 2.4 g/dL (ref 1.5–4.5)
Glucose: 96 mg/dL (ref 70–99)
Potassium: 3.9 mmol/L (ref 3.5–5.2)
Sodium: 141 mmol/L (ref 134–144)
Total Protein: 6.2 g/dL (ref 6.0–8.5)
eGFR: 64 mL/min/{1.73_m2} (ref >59)

## 2022-10-02 LAB — PRO B NATRIURETIC PEPTIDE: NT-Pro BNP: 323 pg/mL (ref 0–486)

## 2022-10-02 LAB — URINALYSIS, ROUTINE W REFLEX MICROSCOPIC
Bilirubin, UA: NEGATIVE
Glucose, UA: NEGATIVE
Nitrite, UA: NEGATIVE
Specific Gravity, UA: 1.02 (ref 1.005–1.030)
Urobilinogen, Ur: 1 mg/dL (ref 0.2–1.0)
pH, UA: 6 (ref 5.0–7.5)

## 2022-10-02 NOTE — Telephone Encounter (Signed)
Pt called in returning call to [facility] number is [phone]

## 2022-10-02 NOTE — Telephone Encounter (Signed)
Returned pts called. Pt stated he had already spoke with Dewayne Hatch through [place].

## 2022-10-05 ENCOUNTER — Ambulatory Visit: Payer: Medicare Other | Attending: Internal Medicine

## 2022-10-05 ENCOUNTER — Encounter: Payer: Self-pay | Admitting: Internal Medicine

## 2022-10-05 ENCOUNTER — Ambulatory Visit: Payer: Self-pay

## 2022-10-05 DIAGNOSIS — I10 Essential (primary) hypertension: Secondary | ICD-10-CM

## 2022-10-05 DIAGNOSIS — N390 Urinary tract infection, site not specified: Secondary | ICD-10-CM | POA: Diagnosis not present

## 2022-10-05 NOTE — Patient Outreach (Signed)
  Care Coordination   10/05/2022 Name: Luis Herrera MRN: [id number] DOB: 12-Aug-1940   Care Coordination Outreach Attempts:  An unsuccessful telephone outreach was attempted for a scheduled appointment today.  Follow Up Plan:  Additional outreach attempts will be made to offer the patient care coordination information and services.   Encounter Outcome:  No Answer   Care Coordination Interventions:  No, not indicated    Rowe Pavy, RN, BSN, [facility] [phone]

## 2022-10-06 NOTE — Progress Notes (Signed)
[facility]   MULTIDISCIPLINARY HEART VALVE CLINIC                                     Cardiology Office Note:    Date:  10/16/2022   ID:  Luis Herrera, DOB 04/03/1940, MRN [id number]  PCP:  Deeann Saint, MD  [facility] Cardiologist:  Dietrich Pates, MD  [facility] Electrophysiologist:  None   Referring MD: Deeann Saint, MD   1 year s/p TAVR  History of Present Illness:    Luis Herrera is a 82 y.o. 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    Mr. Moscoso is followed by Dr. Tenny Craw for his cardiology care. He underwent CABGx1 with AVR in 2009 while still living in [place]. His symptoms were similar at that time with SOB and fatigue prompting a cardiac catheterization and subsequent surgery. After moving to [place], he was followed at [facility] however transitioned to [facility] in 2015. His AVR has been followed with surveillance echocardiograms with stable gradients noted, mostly ranging in the 18-22 mmHg range.    He developed acute CHF and repeat echocardiogram 09/18/21 showed normal LVEF and increased AV gradients with a mean at 38 mmHg. [facility] 09/24/21 showed a patent SVG--> PDA. He underwent successful TAVR with a 23 mm Edwards Sapien 3 UR via the TF  approach on 09/30/21. Post operative echo showed EF 45-50%, mild RV dysfunction, normally functioning VIV TAVR with a mean gradient of 19 mmHg and mild PVL as well as moderate MAC with mild MR/MS. His ASA was restarted. Follow up echo showed increased gradients and PVL. Cardiac CT did not show HALT/HAM or source of PVL.  He recently saw Dr. Tenny Craw in the office on 10/01/22 for evaluation of dizziness and found to be orthostatic. Medications were adjusted and 1 year TAVR echo moved up.  Echo showed mildly reduced EF at 50%, normally functioning VIV TAVR with a mean gradient of 20 mmHg and mild PVL (stable from previous). UA showed UTI and he was treated.   Today the patient presents to clinic for follow up. Here with his daughter Luis Herrera. Doing better with dizziness but still has to stand up quite slowly. No CP or SOB. No LE edema, orthopnea or PND. No dizziness or syncope. No blood in stool or urine. No palpitations.    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 Lynnette Caffey and Dr. Laneta Simmers   Thyroid disease     Past Surgical History:  Procedure Laterality Date   AORTIC VALVE REPLACEMENT  2009   Bioprosthetic at [facility]  CARDIAC CATHETERIZATION  2008   CHOLECYSTECTOMY  1982   COLONOSCOPY     CORONARY ARTERY BYPASS GRAFT  2009   SVG-PDA w/ AVR   CORRECTION HAMMER TOE  2011   BILATERAL   GASTROCNEMIUS RECESSION  05/28/2011   INTRAOPERATIVE TRANSTHORACIC ECHOCARDIOGRAM N/A 09/30/2021   Procedure: INTRAOPERATIVE TRANSTHORACIC ECHOCARDIOGRAM;  Surgeon: Orbie Pyo, MD;  Location: [facility] INVASIVE CV LAB;  Service: Open Heart Surgery;  Laterality: N/A;   POLYPECTOMY     RIGHT/LEFT HEART CATH AND CORONARY/GRAFT ANGIOGRAPHY N/A 09/24/2021   Procedure: RIGHT/LEFT HEART CATH AND CORONARY/GRAFT ANGIOGRAPHY;  Surgeon: Orbie Pyo, MD;  Location: [facility] INVASIVE CV LAB;  Service: Cardiovascular;  Laterality: N/A;   TONSILLECTOMY     TOTAL HIP ARTHROPLASTY Left 2000   TOTAL KNEE ARTHROPLASTY  2000   left   TOTAL KNEE  ARTHROPLASTY  12/01/2011   Procedure: TOTAL KNEE ARTHROPLASTY;  Surgeon: Shelda Pal, MD;  Location: [facility] ORS;  Service: Orthopedics;  Laterality: Right;   TRANSCATHETER AORTIC VALVE REPLACEMENT, TRANSFEMORAL Right 09/30/2021   Procedure: Transcatheter Aortic Valve Replacement, Transfemoral;  Surgeon: Orbie Pyo, MD;  Location: [facility] INVASIVE CV LAB;  Service: Open Heart Surgery;  Laterality: Right;   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   Procedure: TRANSURETHRAL RESECTION OF BLADDER TUMOR (TURBT) BILATERAL RETROGRADE PYELOGRAM;  Surgeon: Crista Elliot, MD;  Location: [facility] ORS;  Service: Urology;  Laterality: Bilateral;  45 MINS FOR CASE   WEIL OSTEOTOMY  05/28/2011    Current Medications: Current Meds  Medication Sig   amiodarone (PACERONE) 200 MG tablet TAKE ONE-HALF TABLET BY MOUTH  DAILY   aspirin EC 81 MG tablet Take 1 tablet (81 mg total) by mouth daily. Swallow whole.   atorvastatin (LIPITOR) 80 MG tablet TAKE 1 TABLET BY MOUTH ONCE  DAILY   Calcium Carb-Cholecalciferol (CALCIUM PLUS VITAMIN D3 PO) Take 2 tablets by mouth daily. Gummy supplements   carvedilol (COREG) 3.125 MG tablet Take 1 tablet (3.125 mg total) by mouth 2 (two) times daily.   cefUROXime (CEFTIN) 250 MG tablet Take 1 tablet (250 mg total) by mouth 2 (two) times daily with a meal.   ezetimibe (ZETIA) 10 MG tablet Take 1 tablet (10 mg total) by mouth daily.   furosemide (LASIX) 20 MG tablet Take 1 tablet (20 mg total) by mouth daily.   levothyroxine (SYNTHROID) 50 MCG tablet TAKE 1 TABLET BY MOUTH DAILY  BEFORE BREAKFAST   Multiple Vitamins-Minerals (PRESERVISION AREDS 2) CAPS Take 1 capsule by mouth 2 (two) times daily.   tamsulosin (FLOMAX) 0.4 MG CAPS capsule Take 0.4 mg by mouth in the morning and at bedtime.   [DISCONTINUED] sacubitril-valsartan (ENTRESTO) 24-26 MG Take 1 tablet by mouth 2 (two) times daily.     Allergies:   Sulfa antibiotics and Bactrim  [sulfamethoxazole-trimethoprim]   Social History   Socioeconomic History   Marital status: Married    Spouse name: Not on file   Number of children: 2   Years of education: 12   Highest education level: High school graduate  Occupational History   Occupation: retired  Tobacco Use   Smoking status: Former    Current packs/day: 0.00    Types: Cigarettes    Quit date: 05/25/1973    Years since quitting: 49.4   Smokeless tobacco: Never  Vaping Use   Vaping status: Never Used  Substance and Sexual Activity   Alcohol use: Not Currently    Comment: rare   Drug use: No  Sexual activity: Not Currently  Other Topics Concern   Not on file  Social History Narrative   Regular exercise-yes. Pt lives in [facility] condo with his wife.   Social Determinants of Health   Financial Resource Strain: Low Risk  (01/21/2022)   Overall Financial Resource Strain (CARDIA)    Difficulty of Paying Living Expenses: Not hard at all  Food Insecurity: No Food Insecurity (01/21/2022)   Hunger Vital Sign    Worried About Running Out of Food in the Last Year: Never true    Ran Out of Food in the Last Year: Never true  Transportation Needs: No Transportation Needs (01/21/2022)   PRAPARE - Administrator, Civil Service (Medical): No    Lack of Transportation (Non-Medical): No  Physical Activity: Sufficiently Active (04/08/2022)   Exercise Vital Sign    Days of Exercise per Week: 4 days    Minutes of Exercise per Session: 50 min  Stress: Not on file  Social Connections: Not on file     Family History: The patient's family history includes Colon cancer (age of onset: 64) in his mother; Heart attack in his brother and father; Hyperlipidemia in an other family member; Hypertension in his brother, sister, and another family member. There is no history of Stroke, Esophageal cancer, Rectal cancer, or Stomach cancer.  ROS:   Please see the history of present illness.    All other systems  reviewed and are negative.  EKGs/Labs/Other Studies Reviewed:      EKG:  EKG is NOT ordered today.    Recent Labs: 05/19/2022: TSH 3.770 10/01/2022: ALT 17; BUN 32; Creatinine, Ser 1.15; Hemoglobin 9.9; NT-Pro BNP 323; Platelets 247; Potassium 3.9; Sodium 141  Recent Lipid Panel    Component Value Date/Time   CHOL 158 05/19/2022 1248   TRIG 91 05/19/2022 1248   HDL 52 05/19/2022 1248   CHOLHDL 3.0 05/19/2022 1248   CHOLHDL 3 11/14/2015 0954   VLDL 16.8 11/14/2015 0954   LDLCALC 89 05/19/2022 1248     Risk Assessment/Calculations:            Physical Exam:    VS:  BP (!) 86/50   Pulse 83   Ht 6' (1.829 m)   Wt 204 lb (92.5 kg)   SpO2 97%   BMI 27.67 kg/m     Wt Readings from Last 3 Encounters:  10/16/22 204 lb (92.5 kg)  10/01/22 204 lb 3.2 oz (92.6 kg)  05/19/22 202 lb 12.8 oz (92 kg)     GEN:  Well nourished, well developed in no acute distress HEENT: Normal NECK: No JVD LYMPHATICS: No lymphadenopathy CARDIAC: RRR, no murmurs, rubs, gallops RESPIRATORY:  Clear to auscultation without rales, wheezing or rhonchi  ABDOMEN: Soft, non-tender, non-distended MUSCULOSKELETAL:  No edema; No deformity  SKIN: Warm and dry NEUROLOGIC:  Alert and oriented x 3 PSYCHIATRIC:  Normal affect   ASSESSMENT:    1. S/P VIV TAVR (transcatheter aortic valve replacement)   2. Chronic combined systolic and diastolic heart failure (HCC)   3. Coronary artery disease involving native coronary artery of native heart without angina pectoris   4. Essential hypertension   5. Frequent PVCs    PLAN:    In order of problems listed above:  Severe AS s/p VIV TAVR: echo 10/02/22 showed mildly reduced EF at 50%, normally functioning VIV TAVR with a mean gradient of 20 mmHg and mild PVL (stable from previous). He has NYHA class I symptoms. Patient understands  he must continue lifelong SBE prophylaxis. Continue regular follow up with Dr. Tenny Craw.    Chronic combined S/D CHF: EF 50% on most  recent echo. Currently on Coreg 6.25mg  BID and Lasix 20mg  daily and Entresto 24-26mg  BID. Stopping Entresto today given hypotension.    CAD s/p CABG with SVG to PDA 2009: denies anginal symptoms. Continue ASA 81mg  QD, Lipitor 80mg  QD, carvedilol 6.25mg .   HTN:  now with hypotension. BP 87/35 today. BP meds changed last visit with Dr. Tenny Craw given orthostatic hypotension. Norvasc stopped, coreg cut from 6.25mg  to 3.125mg  BID and Entresto held a day. BP low in office today but he is actually feeling better. Will stop Entresto now and follow up with him next week.    PVCs: controlled on Amiodarone and Coreg 3.125mg  BID. Follows with Dr. Ladona Ridgel.   Medication Adjustments/Labs and Tests Ordered: Current medicines are reviewed at length with the patient today.  Concerns regarding medicines are outlined above.  No orders of the defined types were placed in this encounter.  No orders of the defined types were placed in this encounter.   Patient Instructions  Medication Instructions:  Your physician has recommended you make the following change in your medication:  1) STOP taking Entresto *If you need a refill on your cardiac medications before your next appointment, please call your pharmacy*  Follow-Up: At [facility], you and your health needs are our priority.  As part of our continuing mission to provide you with exceptional heart care, we have created designated Provider Care Teams.  These Care Teams include your primary Cardiologist (physician) and Advanced Practice Providers (APPs -  Physician Assistants and Nurse Practitioners) who all work together to provide you with the care you need, when you need it.  Your next appointment:   As scheduled    Signed, Cline Crock, PA-C  10/16/2022 12:19 PM    [facility]

## 2022-10-07 ENCOUNTER — Encounter: Payer: Self-pay | Admitting: Internal Medicine

## 2022-10-08 LAB — URINE CULTURE

## 2022-10-09 ENCOUNTER — Telehealth: Payer: Self-pay | Admitting: Internal Medicine

## 2022-10-09 NOTE — Telephone Encounter (Signed)
Pt would like a call from a nurse. Please advise

## 2022-10-09 NOTE — Telephone Encounter (Signed)
Pt states that he is still awaiting a return call from earlier.Pt says that he is needing an antibiotic for UTI. He would like a callback regarding this matter. Please advise

## 2022-10-12 MED ORDER — CEFUROXIME AXETIL 250 MG PO TABS: 250.0000 mg | ORAL_TABLET | Freq: Two times a day (BID) | ORAL | 0 refills | Status: DC

## 2022-10-12 NOTE — Telephone Encounter (Signed)
I spoke with the pt and will close this encounter.

## 2022-10-12 NOTE — Telephone Encounter (Signed)
Pt has been informed will close this encounter.

## 2022-10-15 ENCOUNTER — Other Ambulatory Visit (HOSPITAL_COMMUNITY): Payer: Medicare Other

## 2022-10-16 ENCOUNTER — Other Ambulatory Visit (HOSPITAL_COMMUNITY): Payer: Medicare Other

## 2022-10-16 ENCOUNTER — Ambulatory Visit: Payer: Medicare Other | Attending: Cardiology | Admitting: Physician Assistant

## 2022-10-16 VITALS — BP 86/50 | HR 83 | Ht 72.0 in | Wt 204.0 lb

## 2022-10-16 DIAGNOSIS — I2510 Atherosclerotic heart disease of native coronary artery without angina pectoris: Secondary | ICD-10-CM

## 2022-10-16 DIAGNOSIS — I493 Ventricular premature depolarization: Secondary | ICD-10-CM

## 2022-10-16 DIAGNOSIS — I5042 Chronic combined systolic (congestive) and diastolic (congestive) heart failure: Secondary | ICD-10-CM | POA: Diagnosis not present

## 2022-10-16 DIAGNOSIS — I10 Essential (primary) hypertension: Secondary | ICD-10-CM

## 2022-10-16 DIAGNOSIS — Z952 Presence of prosthetic heart valve: Secondary | ICD-10-CM

## 2022-10-16 NOTE — Patient Instructions (Signed)
Medication Instructions:  Your physician has recommended you make the following change in your medication:  1) STOP taking Entresto *If you need a refill on your cardiac medications before your next appointment, please call your pharmacy*  Follow-Up: At [facility], you and your health needs are our priority.  As part of our continuing mission to provide you with exceptional heart care, we have created designated Provider Care Teams.  These Care Teams include your primary Cardiologist (physician) and Advanced Practice Providers (APPs -  Physician Assistants and Nurse Practitioners) who all work together to provide you with the care you need, when you need it.  Your next appointment:   As scheduled

## 2022-10-20 ENCOUNTER — Telehealth: Payer: Self-pay | Admitting: Internal Medicine

## 2022-10-20 DIAGNOSIS — Z85828 Personal history of other malignant neoplasm of skin: Secondary | ICD-10-CM | POA: Diagnosis not present

## 2022-10-20 DIAGNOSIS — L821 Other seborrheic keratosis: Secondary | ICD-10-CM | POA: Diagnosis not present

## 2022-10-20 DIAGNOSIS — D692 Other nonthrombocytopenic purpura: Secondary | ICD-10-CM | POA: Diagnosis not present

## 2022-10-20 DIAGNOSIS — L570 Actinic keratosis: Secondary | ICD-10-CM | POA: Diagnosis not present

## 2022-10-20 DIAGNOSIS — L814 Other melanin hyperpigmentation: Secondary | ICD-10-CM | POA: Diagnosis not present

## 2022-10-20 DIAGNOSIS — D1801 Hemangioma of skin and subcutaneous tissue: Secondary | ICD-10-CM | POA: Diagnosis not present

## 2022-10-20 NOTE — Telephone Encounter (Signed)
Spoke with patient's daughter Luis Herrera. She states patient was taken off of his Entresto last week on Friday 8/30 due to low BP.  [name] reports patient's BP has increased to 140-150's/70's over the weekend.  8/31--145/75 9/1--156/77 (restarted Entresto) 9/2--143/73 9/3--137/67  [name] states patient has put himself back on Entresto on Sunday. Advised [name] to have patient continue to monitor his BP daily.  Patient has appt with [name], PA-C Friday 9/6 for follow-up. [name] states patient prefers to see Dr. Tenny Craw or Dr. Ladona Ridgel. Neither provider has any openings until December.  Will forward to Dr. Tenny Craw and Dr. Lubertha Basque nurses to see if patient can be scheduled sooner. Will also forward to [name], PA-C and Dr. Tenny Craw to make them aware of recent BP readings and patient putting himself back on Entresto.

## 2022-10-20 NOTE — Telephone Encounter (Signed)
Pt c/o medication issue:  1. Name of Medication:  Entresto   2. How are you currently taking this medication (dosage and times per day)?   3. Are you having a reaction (difficulty breathing--STAT)?   4. What is your medication issue?   Daughter states patient was taken off Entresto which caused his BP to increase. Systolic has been up to about 932/35'T. Daughter states patient is asymptomatic.

## 2022-10-22 ENCOUNTER — Telehealth: Payer: Self-pay | Admitting: *Deleted

## 2022-10-22 NOTE — Progress Notes (Signed)
  Care Coordination Note  10/22/2022 Name: Luis Herrera MRN: [id number] DOB: 03/15/1940  Luis Herrera is a 82 y.o. year old male who is a primary care patient of Deeann Saint, MD and is actively engaged with the care management team. I reached out to Luis Herrera by phone today to assist with re-scheduling a follow up visit with the RN Case Manager  Follow up plan: Pt has requested that he call us back to reschedule - doesn't want anymore f/u calls to reschedule with nurse - says he would call me back in a week or so  Luis Herrera, [facility] Care Coordination Care Guide Direct Dial: [phone]

## 2022-10-23 ENCOUNTER — Other Ambulatory Visit: Payer: Self-pay | Admitting: Internal Medicine

## 2022-10-23 ENCOUNTER — Ambulatory Visit: Payer: Medicare Other

## 2022-10-23 NOTE — Telephone Encounter (Signed)
Pt's pharmacy is requesting a refil on furosemide 40 mg tablets. This medication was decreased to furosemide 20 mg daily. Pt stated that he has started back on furosemide 40 mg tablets. Would Dr. Tenny Craw like to reorder this medication? Please address

## 2022-10-26 NOTE — Telephone Encounter (Signed)
Pt to see Dr Tenny Craw 10/30/22.

## 2022-10-29 NOTE — Progress Notes (Signed)
Cardiology Office Note   Date:  10/30/2022   ID:  Luis Herrera, DOB [birth year], MRN [id number]  PCP:  Deeann Saint, MD  Cardiologist:   Dietrich Pates, MD   Pt presents for follow up of BBP     History of Present Illness: Luis Herrera is a 82 y.o. 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    Pt  is also followed by Rosette Reveal  and Philomena Course    Amiodarone is being used to suppress PVCs    I saw the pt on 10/01/22  His BP was very  low at the time and he wa very symptaomaticBP was low   I recomm backing down on meds (stopping amlodipine and Entresto; cutting carvedilol in 1/2  Labs drawn.  Pt set up for echo  Echo in 10/02/22 showed LVEF 50%   TAVR valve mean gradient 20mm HG, down from previous.  Mild perivalvular AI.  The pt was seen by Philomena Course on 8/30   HE was still hypotensive.  Appears still taking Entresto.   This was stopped     The pt says that he held this for a day   Then BP went up and he resumed Entresto    Since then BP has been good       He denies dizziness   Breathing is OK  No palpitations      Current Meds  Medication Sig   amiodarone (PACERONE) 200 MG tablet TAKE ONE-HALF TABLET BY MOUTH  DAILY   aspirin EC 81 MG tablet Take 1 tablet (81 mg total) by mouth daily. Swallow whole.   atorvastatin (LIPITOR) 80 MG tablet TAKE 1 TABLET BY MOUTH ONCE  DAILY   Calcium Carb-Cholecalciferol (CALCIUM PLUS VITAMIN D3 PO) Take 2 tablets by mouth daily. Gummy supplements   carvedilol (COREG) 3.125 MG tablet Take 1 tablet (3.125 mg total) by mouth 2 (two) times daily.   ezetimibe (ZETIA) 10 MG tablet Take 1 tablet (10 mg total) by mouth daily.   furosemide (LASIX) 20 MG tablet Take 1 tablet (20 mg total) by mouth daily.   furosemide (LASIX) 40 MG tablet TAKE ONE  TABLET (40 MG) EVERY OTHER DAY ALTERNATING WITH 1 1/2 TABLETS (60 MG) ON THE OPPOSITE DAYS.   levothyroxine (SYNTHROID) 50 MCG tablet TAKE 1 TABLET BY MOUTH DAILY  BEFORE BREAKFAST   Multiple Vitamins-Minerals (PRESERVISION AREDS 2) CAPS Take 1 capsule by mouth 2 (two) times daily.   tamsulosin (FLOMAX) 0.4 MG CAPS capsule Take 0.4 mg by mouth in the morning and at bedtime.     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 Lynnette Caffey  and Dr. Laneta Simmers   Thyroid disease     Past Surgical History:  Procedure Laterality Date   AORTIC VALVE REPLACEMENT  2009   Bioprosthetic at [facility]   CARDIAC CATHETERIZATION  2008   CHOLECYSTECTOMY  1982   COLONOSCOPY     CORONARY ARTERY BYPASS GRAFT  2009   SVG-PDA w/ AVR   CORRECTION HAMMER TOE  2011   BILATERAL   GASTROCNEMIUS RECESSION  05/28/2011   INTRAOPERATIVE TRANSTHORACIC ECHOCARDIOGRAM N/A 09/30/2021   Procedure: INTRAOPERATIVE TRANSTHORACIC ECHOCARDIOGRAM;  Surgeon: Orbie Pyo, MD;  [facility];  Service: Open Heart Surgery;  Laterality: N/A;   POLYPECTOMY     RIGHT/LEFT HEART CATH AND CORONARY/GRAFT ANGIOGRAPHY N/A 09/24/2021   Procedure: RIGHT/LEFT HEART CATH AND CORONARY/GRAFT ANGIOGRAPHY;  Surgeon: Orbie Pyo, MD;  Location: [facility];  Service: Cardiovascular;  Laterality: N/A;   TONSILLECTOMY     TOTAL HIP ARTHROPLASTY Left 2000   TOTAL KNEE ARTHROPLASTY  2000    left   TOTAL KNEE ARTHROPLASTY  12/01/2011   Procedure: TOTAL KNEE ARTHROPLASTY;  Surgeon: Shelda Pal, MD;  Location: [facility];  Service: Orthopedics;  Laterality: Right;   TRANSCATHETER AORTIC VALVE REPLACEMENT, TRANSFEMORAL Right 09/30/2021   Procedure: Transcatheter Aortic Valve Replacement, Transfemoral;  Surgeon: Orbie Pyo, MD;  [facility];  Service: Open Heart Surgery;  Laterality: Right;   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 His smoking use included cigarettes. He has never used smokeless tobacco. He reports that he does not currently use alcohol. He reports that he does not use drugs.   Family History:  The patient's family history includes Colon cancer (age of onset: 81) in his mother; Heart attack in his brother and father; Hyperlipidemia in an other family member; Hypertension in his brother, sister, and another family member.    ROS:  Please see the history of present illness. All other systems are reviewed and  Negative to the above problem except as noted.    PHYSICAL EXAM: VS:  BP (!) 128/58 (BP Location: Left Arm, Patient Position: Sitting, Cuff Size: Large)   Pulse (!) 58   Ht 6' (1.829 m)   Wt 204 lb 6.4 oz (92.7 kg)   SpO2 96%   BMI 27.72 kg/m    GEN: PT is an 82 year-old in no acute distress  HEENT: normal  Neck: JVP is not elevated  Cardiac: RRR; Gr I/VI systolic murmur base  Triv LE edema (wearing support socks) Respiratory:  clear to auscultation GI: soft, nontender, No hepatomegaly    EKG:  EKG not done      Echo 10/02/22   1. Left ventricular ejection fraction, by estimation, is 50%. The  left  ventricle has mildly decreased function. The left ventricle demonstrates  global hypokinesis. There is mild concentric left ventricular hypertrophy.  Left ventricular diastolic  parameters are consistent with Grade I diastolic dysfunction (impaired  relaxation).   2. Right ventricular systolic function is mildly reduced. The right  ventricular size is normal. Tricuspid regurgitation signal is inadequate  for assessing PA pressure.   3.  Left atrial size was mildly dilated.   4. The mitral valve is normal in structure. Trivial mitral valve  regurgitation. No evidence of mitral stenosis. Moderate mitral annular  calcification.   5. S/p valve-in-valve TAVR (previous SAVR). Mild peri-valvular leakage.  Mean gradient 20 mmHg. This is elevated but lower than previous echo. EOA  1.19 cm^2 with dimensionless index 0.3.   6. IVC not visualized.   CT   Sept 2024   1. No evidence of HALT or HAM on TAVR prosthesis to explain increase in gradients. Normal leaflet thickness. No thrombus or pannus.   2. No evidence of valve dehiscence. TAVR prosthesis is well seated and well apposed to the prior surgical aortic valve prosthesis. No CT findings to suggest source of paravalvular regurgitation.    Echo  Sept   2023   1. Left ventricular ejection fraction, by estimation, is 50 to 55%. The  left ventricle has low normal function. The left ventricle demonstrates  global hypokinesis. There is moderate left ventricular hypertrophy. Left  ventricular diastolic parameters are   consistent with Grade II diastolic dysfunction (pseudonormalization).  Elevated left atrial pressure.   2. Right ventricular systolic function is normal. The right ventricular  size is mildly enlarged.   3. Left atrial size was moderately dilated.   4. The mitral valve is degenerative. Mild to moderate mitral valve  regurgitation.   5. Tricuspid valve regurgitation is mild to moderate.   6. The major aortic regurgitant  jet appears to be perivalvular at 2-3  o'clock, but there may be an intravalvular component. Aortic valve  gradients are higher, probably in part due to AI-related increased stroke  volume. However, the dimensionless index  has decreased, which may signify a true reduction in the effective valve  area. The aortic valve has been repaired/replaced. Aortic valve  regurgitation is moderate. There is a Edwards valve in valve present in  the aortic position. Procedure Date:  09/30/2021. Aortic regurgitation PHT measures 354 msec. Aortic valve mean  gradient measures 28.0 mmHg. Aortic valve Vmax measures 3.64 m/s. Aortic  valve acceleration time measures 100 msec.   Comparison(s): Aortic insufficiency has worsened and aortic valve  gradients are higher. The aortic ejection jet remains relatively early  peaking (acceleration time 100 ms). Suspect the pulsed Doppler sample  volume is placed too deep in the ventricle,  which leads to underestimation of the dimensionless index and the aortic  valve area.    [place]    01/09/20  Study Highlights  1. NSR with sinus brady and sinus tachycardia 2. Frequent PVC's and PAC's 3. NS AT 4. Rare triplets 5. Bigeminal and trigeminal PVC's  Lipid Panel    Component Value Date/Time   CHOL 158 05/19/2022 1248   TRIG 91 05/19/2022 1248   HDL 52 05/19/2022 1248   CHOLHDL 3.0 05/19/2022 1248   CHOLHDL 3 11/14/2015 0954   VLDL 16.8 11/14/2015 0954   LDLCALC 89 05/19/2022 1248      Wt Readings from Last 3 Encounters:  10/30/22 204 lb 6.4 oz (92.7 kg)  10/16/22 204 lb (92.5 kg)  10/01/22 204 lb 3.2 oz (92.6 kg)      ASSESSMENT AND PLAN:   1  Dizziness/weakness   The pt was orthostatic when I saw him the last time in Aug.  UA was a little concentrated  Echo with no signficant change from previous    Meds held    He was still hypotensive on visit with Philomena Course on 8/30  Entresto held for 1 day   Then hs BP went up   He is back on Entresto and  carvedilol now and feeling good.  BP is good   Plan:  Will check BMET  and BNP today    Continue to follow BP closely at home      2  AV dz   S/p TAVR in Aug 2023  Echo in Sept 2023 showed increased gradient, and mod AI   CT without  evid of HALTor HAM.   Normal leaflet excursion   Echo in AUg 2024 , mean gradient was 20 mm Hg, a little lower than previous     LVEF normal  Will repeat   2  CAD   Cath prior to procedure showed patient graft to RCA   LCoronary system OK   Pt denies CP     3  HTN   BP is improved   Follow     4  HL  LDL 89  HDL 52  Need to get back on statin     5  CAD   Mild dz of carotids  6  Hx of PVCs  On AMiodarone 100 mg  Continue.      Current medicines are reviewed at length with the patient today.  The patient does not have concerns regarding medicines.  Signed, Dietrich Pates, MD  10/30/2022 9:15 PM    [facility] [address] [place], [place], [place]  [address] Phone: [phone]; Fax: [phone]

## 2022-10-30 ENCOUNTER — Ambulatory Visit: Payer: Medicare Other | Attending: Internal Medicine | Admitting: Internal Medicine

## 2022-10-30 ENCOUNTER — Encounter: Payer: Self-pay | Admitting: Internal Medicine

## 2022-10-30 VITALS — BP 128/58 | HR 58 | Ht 72.0 in | Wt 204.4 lb

## 2022-10-30 DIAGNOSIS — I2510 Atherosclerotic heart disease of native coronary artery without angina pectoris: Secondary | ICD-10-CM | POA: Diagnosis not present

## 2022-10-30 DIAGNOSIS — I5043 Acute on chronic combined systolic (congestive) and diastolic (congestive) heart failure: Secondary | ICD-10-CM

## 2022-10-30 DIAGNOSIS — I5031 Acute diastolic (congestive) heart failure: Secondary | ICD-10-CM | POA: Diagnosis not present

## 2022-10-30 DIAGNOSIS — I5020 Unspecified systolic (congestive) heart failure: Secondary | ICD-10-CM

## 2022-10-30 DIAGNOSIS — I10 Essential (primary) hypertension: Secondary | ICD-10-CM

## 2022-10-30 DIAGNOSIS — E785 Hyperlipidemia, unspecified: Secondary | ICD-10-CM

## 2022-10-30 DIAGNOSIS — Z79899 Other long term (current) drug therapy: Secondary | ICD-10-CM | POA: Diagnosis not present

## 2022-10-30 NOTE — Patient Instructions (Signed)
Medication Instructions:   Your physician recommends that you continue on your current medications as directed. Please refer to the Current Medication list given to you today.  *If you need a refill on your cardiac medications before your next appointment, please call your pharmacy*   Lab Work:  TODAY--BMET, CBC W DIFF, PRO-BNP, AND NMR LIPOPROFILE  If you have labs (blood work) drawn today and your tests are completely normal, you will receive your results only by: MyChart Message (if you have MyChart) OR A paper copy in the mail If you have any lab test that is abnormal or we need to change your treatment, we will call you to review the results.    Follow-Up:  IN DECEMBER WITH DR. Tenny Craw

## 2022-10-31 LAB — CBC WITH DIFFERENTIAL/PLATELET
Basophils Absolute: 0.1 10*3/uL (ref 0.0–0.2)
Basos: 1 %
EOS (ABSOLUTE): 0.4 10*3/uL (ref 0.0–0.4)
Eos: 7 %
Hematocrit: 30.7 % — ABNORMAL LOW (ref 37.5–51.0)
Hemoglobin: 9.7 g/dL — ABNORMAL LOW (ref 13.0–17.7)
Immature Grans (Abs): 0 10*3/uL (ref 0.0–0.1)
Immature Granulocytes: 1 %
Lymphocytes Absolute: 1.2 10*3/uL (ref 0.7–3.1)
Lymphs: 17 %
MCH: 30.8 pg (ref 26.6–33.0)
MCHC: 31.6 g/dL (ref 31.5–35.7)
MCV: 98 fL — ABNORMAL HIGH (ref 79–97)
Monocytes Absolute: 0.5 10*3/uL (ref 0.1–0.9)
Monocytes: 7 %
Neutrophils Absolute: 4.5 10*3/uL (ref 1.4–7.0)
Neutrophils: 67 %
Platelets: 239 10*3/uL (ref 150–450)
RBC: 3.15 x10E6/uL — ABNORMAL LOW (ref 4.14–5.80)
RDW: 12.9 % (ref 11.6–15.4)
WBC: 6.6 10*3/uL (ref 3.4–10.8)

## 2022-10-31 LAB — BASIC METABOLIC PANEL
BUN/Creatinine Ratio: 31 — ABNORMAL HIGH (ref 10–24)
BUN: 32 mg/dL — ABNORMAL HIGH (ref 8–27)
CO2: 22 mmol/L (ref 20–29)
Calcium: 8.9 mg/dL (ref 8.6–10.2)
Chloride: 106 mmol/L (ref 96–106)
Creatinine, Ser: 1.04 mg/dL (ref 0.76–1.27)
Glucose: 128 mg/dL — ABNORMAL HIGH (ref 70–99)
Potassium: 4.2 mmol/L (ref 3.5–5.2)
Sodium: 143 mmol/L (ref 134–144)
eGFR: 72 mL/min/{1.73_m2} (ref >59)

## 2022-10-31 LAB — NMR, LIPOPROFILE
Cholesterol, Total: 138 mg/dL (ref 100–199)
HDL Particle Number: 32.4 umol/L (ref >=30.5)
HDL-C: 55 mg/dL (ref >39)
LDL Particle Number: 873 nmol/L (ref <1000)
LDL Size: 20.3 nm — ABNORMAL LOW (ref >20.5)
LDL-C (NIH Calc): 66 mg/dL (ref 0–99)
LP-IR Score: 35 (ref <=45)
Small LDL Particle Number: 506 nmol/L (ref <=527)
Triglycerides: 93 mg/dL (ref 0–149)

## 2022-10-31 LAB — PRO B NATRIURETIC PEPTIDE: NT-Pro BNP: 492 pg/mL — ABNORMAL HIGH (ref 0–486)

## 2022-11-01 NOTE — Progress Notes (Signed)
Electrolyte and kidney function are OK Fluid is only very mildly increased Lipids are excellent      CBC is stable    Hgb 9.7     Keep on same meds

## 2022-11-02 ENCOUNTER — Other Ambulatory Visit: Payer: Self-pay

## 2022-11-02 DIAGNOSIS — Z79899 Other long term (current) drug therapy: Secondary | ICD-10-CM

## 2022-11-02 DIAGNOSIS — I2510 Atherosclerotic heart disease of native coronary artery without angina pectoris: Secondary | ICD-10-CM

## 2022-11-02 DIAGNOSIS — E785 Hyperlipidemia, unspecified: Secondary | ICD-10-CM

## 2022-11-02 MED ORDER — ROSUVASTATIN CALCIUM 5 MG PO TABS: 5.0000 mg | ORAL_TABLET | Freq: Every day | ORAL | 3 refills | Status: DC

## 2022-11-03 DIAGNOSIS — R350 Frequency of micturition: Secondary | ICD-10-CM | POA: Diagnosis not present

## 2022-11-03 DIAGNOSIS — N3020 Other chronic cystitis without hematuria: Secondary | ICD-10-CM | POA: Diagnosis not present

## 2022-11-06 ENCOUNTER — Telehealth: Payer: Self-pay

## 2022-11-06 NOTE — Patient Outreach (Signed)
Care Coordination   11/06/2022 Name: Luis Herrera MRN: [id number] DOB: 07-04-1940   Care Coordination Outreach Attempts:  A second unsuccessful outreach was attempted today to offer the patient with information about available care coordination services.  Attempted to reach patient today and a man answered and reported that patient is not available.  Follow Up Plan:  Additional outreach attempts will be made to offer the patient care coordination information and services.   Encounter Outcome:  No Answer   Care Coordination Interventions:  No, not indicated    Lonia Chimera, RN, BSN, CEN [facility] [phone]

## 2022-11-11 ENCOUNTER — Telehealth: Payer: Self-pay | Admitting: *Deleted

## 2022-11-11 NOTE — Progress Notes (Signed)
Care Coordination Note  11/11/2022 Name: Luis Herrera MRN: [id number] DOB: [birth year]  Luis Herrera is a 82 y.o. year old male who is a primary care patient of Deeann Saint, MD and is actively engaged with the care management team. I reached out to Luis Herrera by phone today to assist with re-scheduling a follow up visit with the RN Case Manager  Follow up plan: We have been unable to make contact with the patient for follow up.   Burman Nieves, CCMA Care Coordination Care Guide Direct Dial: [phone]

## 2022-11-25 ENCOUNTER — Other Ambulatory Visit: Payer: Self-pay | Admitting: Internal Medicine

## 2023-01-06 ENCOUNTER — Telehealth: Payer: Self-pay | Admitting: Podiatry

## 2023-01-06 ENCOUNTER — Ambulatory Visit: Payer: Medicare Other | Admitting: Podiatry

## 2023-01-06 ENCOUNTER — Encounter: Payer: Self-pay | Admitting: Podiatry

## 2023-01-06 ENCOUNTER — Ambulatory Visit (INDEPENDENT_AMBULATORY_CARE_PROVIDER_SITE_OTHER): Payer: Medicare Other

## 2023-01-06 VITALS — Ht 72.0 in | Wt 204.0 lb

## 2023-01-06 DIAGNOSIS — Z978 Presence of other specified devices: Secondary | ICD-10-CM

## 2023-01-06 DIAGNOSIS — L03032 Cellulitis of left toe: Secondary | ICD-10-CM

## 2023-01-06 MED ORDER — DOXYCYCLINE HYCLATE 100 MG PO TABS: 100.0000 mg | ORAL_TABLET | Freq: Two times a day (BID) | ORAL | 0 refills | Status: DC

## 2023-01-06 NOTE — Telephone Encounter (Signed)
DOS-01/07/2023  REMOVAL HARDWARE LEFT-[id number]  [facility] EFFECTIVE DATE- 02/16/2022  DEDUCTIBLE- $0.00 WITH REMAINING $0.00 OOP-$4500.00  WITH REMAINING $1610.96 COINSURANCE- 0%  PER THE [employer] WEBSITE PORTAL, PRIOR AUTH IS NOT REQUIRED FOR CPT CODE 04540. GOOD FROM 01/07/2023 - 02/16/2023  AUTH Decision ID #: [id number]

## 2023-01-06 NOTE — Progress Notes (Signed)
Chief Complaint  Patient presents with   Ingrown Toenail    Patient is here for left hallux ingrown nail and infection    HPI: 82 y.o. male presenting today for evaluation of redness with swelling to the left great toe.  Onset about 1 week.  Idiopathic onset.  He denies a history of injury to the area.  He says that last night he actually noticed some purulence coming from the underlying the nail plate.  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 Lynnette Caffey and Dr. Laneta Simmers   Thyroid disease     Past Surgical History:  Procedure Laterality Date   AORTIC VALVE REPLACEMENT  2009   Bioprosthetic at [facility]   CARDIAC CATHETERIZATION  2008   CHOLECYSTECTOMY  1982   COLONOSCOPY     CORONARY ARTERY BYPASS GRAFT  2009   SVG-PDA w/ AVR   CORRECTION HAMMER TOE  2011   BILATERAL   GASTROCNEMIUS RECESSION  05/28/2011   INTRAOPERATIVE TRANSTHORACIC ECHOCARDIOGRAM N/A 09/30/2021   Procedure: INTRAOPERATIVE TRANSTHORACIC ECHOCARDIOGRAM;  Surgeon: Orbie Pyo, MD;  [facility];  Service: Open Heart Surgery;  Laterality: N/A;   POLYPECTOMY     RIGHT/LEFT HEART CATH AND CORONARY/GRAFT ANGIOGRAPHY N/A 09/24/2021   Procedure: RIGHT/LEFT HEART CATH AND CORONARY/GRAFT ANGIOGRAPHY;  Surgeon: Orbie Pyo, MD;  Location: [facility];  Service: Cardiovascular;  Laterality: N/A;   TONSILLECTOMY     TOTAL HIP ARTHROPLASTY Left 2000   TOTAL KNEE ARTHROPLASTY  2000   left    TOTAL KNEE ARTHROPLASTY  12/01/2011   Procedure: TOTAL KNEE ARTHROPLASTY;  Surgeon: Shelda Pal, MD;  Location: [facility];  Service: Orthopedics;  Laterality: Right;   TRANSCATHETER AORTIC VALVE REPLACEMENT, TRANSFEMORAL Right 09/30/2021   Procedure: Transcatheter Aortic Valve Replacement, Transfemoral;  Surgeon: Orbie Pyo, MD;  [facility];  Service: Open Heart Surgery;  Laterality: Right;   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   Procedure: TRANSURETHRAL RESECTION OF BLADDER TUMOR (TURBT) BILATERAL RETROGRADE PYELOGRAM;  Surgeon: Crista Elliot, MD;  Location: [facility];  Service: Urology;  Laterality: Bilateral;  45 MINS FOR CASE   WEIL OSTEOTOMY  05/28/2011    Allergies  Allergen Reactions   Sulfa Antibiotics Other (See Comments)    Lurline Hare Syndrome   Bactrim [Sulfamethoxazole-Trimethoprim] Other (See Comments)    Lurline Hare Syndrome     LT great toe 01/06/2023  Physical Exam: General: The patient is alert and oriented x3 in no acute distress.  Dermatology: Skin is warm, dry and supple bilateral lower extremities.  With debridement of the nail plate there was a significant mount of purulence under the nail plate.  No open lacerations upon initial evaluation  Vascular: Palpable pedal pulses bilaterally. Capillary refill within normal limits.  Erythema and edema noted encompassing the left great toe.  Neurological: Grossly intact via  light touch  Musculoskeletal Exam: No pedal deformities noted.  History of forefoot surgery about 10 years ago with Dr. Victorino Dike at [facility] orthopedics  Radiographic Exam LT foot 01/06/2023:  Orthopedic hardware noted within the digits of the foot from prior surgery.  There are 2 orthopedic screws within the phalanges of the great toe.  There is radiolucency noted around the distal portion of the one of the screws.  No acute fracture identified.  Assessment/Plan of Care: 1.  Paronychia  left hallux nail plate with subungual abscess 2.  Exposed orthopedic screw left great toe  -Patient evaluated -Initially debridement of the nail was performed and there was a significant amount of purulence coming from underlying the nail plate.  Decision was made to perform a total temporary nail avulsion of the hallux nail plate. -The toe was prepped in aseptic manner and digital block performed using 3 mL of 2% lidocaine plain.  The nail was avulsed in its entirety.  After removal of the overlying nail plate there was en exposed head of an orthopedic screw.  Unfortunately I did not have any drivers to remove the nail.  I do believe that the orthopedic screw does need to be removed due to harboring of infection.  This was discussed with the patient and they agree -We will plan to have the orthopedic screw removed tomorrow at the surgery center.  The patient consented for surgical removal of the orthopedic screw.  No guarantees were expressed or implied.  All patient questions answered. -Prescription for doxycycline 100 mg 2 times daily #20 -Return to clinic 1 week postop     Felecia Shelling, DPM [facility]  Dr. Felecia Shelling, DPM    [address] [place], [place] [address]                Office [phone]  Fax [phone]

## 2023-01-07 DIAGNOSIS — Z4889 Encounter for other specified surgical aftercare: Secondary | ICD-10-CM | POA: Diagnosis not present

## 2023-01-07 DIAGNOSIS — T847XXA Infection and inflammatory reaction due to other internal orthopedic prosthetic devices, implants and grafts, initial encounter: Secondary | ICD-10-CM | POA: Diagnosis not present

## 2023-01-07 DIAGNOSIS — Z472 Encounter for removal of internal fixation device: Secondary | ICD-10-CM | POA: Diagnosis not present

## 2023-01-13 ENCOUNTER — Ambulatory Visit (INDEPENDENT_AMBULATORY_CARE_PROVIDER_SITE_OTHER): Payer: Medicare Other | Admitting: Podiatry

## 2023-01-13 ENCOUNTER — Ambulatory Visit: Payer: Medicare Other

## 2023-01-13 DIAGNOSIS — Z9889 Other specified postprocedural states: Secondary | ICD-10-CM

## 2023-01-13 DIAGNOSIS — L03032 Cellulitis of left toe: Secondary | ICD-10-CM | POA: Diagnosis not present

## 2023-01-13 NOTE — Progress Notes (Signed)
Subjective:  Patient ID: Luis Herrera, male    DOB: 01-06-1941,  MRN: [id number]  No chief complaint on file.   DOS: 01/07/23 Procedure: Left great toe hardware removal  82 y.o. male returns for POV#1. Relates doing well. Has been taking the doxycycline.   Review of Systems: Negative except as noted in the HPI. Denies N/V/F/Ch.  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 Lynnette Caffey and Dr. Laneta Simmers   Thyroid disease     Current Outpatient Medications:    amiodarone (PACERONE) 200 MG tablet, TAKE ONE-HALF TABLET BY MOUTH  DAILY, Disp: 50 tablet, Rfl: 2   aspirin EC 81 MG tablet, Take 1 tablet (81 mg total) by mouth daily. Swallow whole., Disp: 90 tablet, Rfl: 3   atorvastatin (LIPITOR) 80 MG tablet, TAKE 1 TABLET BY MOUTH ONCE  DAILY, Disp: 90 tablet, Rfl: 3   Calcium Carb-Cholecalciferol (CALCIUM PLUS VITAMIN D3 PO), Take 2 tablets by mouth daily. Gummy supplements, Disp: , Rfl:    carvedilol (COREG) 3.125 MG tablet, Take 1 tablet (3.125 mg total) by mouth 2 (two) times daily., Disp: 180 tablet, Rfl: 3   carvedilol (COREG) 6.25 MG tablet, TAKE 1 TABLET BY MOUTH TWICE A DAY, Disp: 180 tablet, Rfl: 1   cefUROXime (CEFTIN) 250 MG tablet, Take 1 tablet (250 mg total) by mouth 2 (two) times daily with a meal., Disp: 20 tablet, Rfl: 0   doxycycline (VIBRA-TABS) 100 MG tablet, Take 1 tablet (100 mg total) by mouth 2 (two) times daily., Disp: 20 tablet, Rfl: 0   ezetimibe (ZETIA) 10 MG tablet, TAKE 1 TABLET BY MOUTH EVERY DAY, Disp: 90 tablet, Rfl: 3   furosemide  (LASIX) 20 MG tablet, Take 1 tablet (20 mg total) by mouth daily., Disp: 30 tablet, Rfl: 8   furosemide (LASIX) 40 MG tablet, TAKE ONE TABLET (40 MG) EVERY OTHER DAY ALTERNATING WITH 1 1/2 TABLETS (60 MG) ON THE OPPOSITE DAYS., Disp: 360 tablet, Rfl: 1   levothyroxine (SYNTHROID) 50 MCG tablet, TAKE 1 TABLET BY MOUTH DAILY  BEFORE BREAKFAST, Disp: 90 tablet, Rfl: 3   Multiple Vitamins-Minerals (PRESERVISION AREDS 2) CAPS, Take 1 capsule by mouth 2 (two) times daily., Disp: , Rfl:    tamsulosin (FLOMAX) 0.4 MG CAPS capsule, Take 0.4 mg by mouth in the morning and at bedtime., Disp: , Rfl:   Social History   Tobacco Use  Smoking Status Former   Current packs/day: 0.00   Types: Cigarettes   Quit date: 05/25/1973   Years since quitting: 49.6  Smokeless Tobacco Never    Allergies  Allergen Reactions   Sulfa Antibiotics Other (See Comments)    Lurline Hare Syndrome   Bactrim [Sulfamethoxazole-Trimethoprim] Other (See Comments)    Lurline Hare Syndrome   Objective:  There were no vitals filed for this visit. There is no height or weight on file to calculate BMI. Constitutional Well developed. Well nourished.  Vascular Foot warm and well perfused. Capillary refill normal to all  digits.   Neurologic Normal speech. Oriented to person, place, and time. Epicritic sensation to light touch grossly present bilaterally.  Dermatologic Skin healing well without signs of infection. Skin edges well coapted without signs of infection.  Orthopedic: Tenderness to palpation noted about the surgical site.     Radiographs: Interval removal of screw left great toe.  Assessment:   1. Post-operative state   2. Cellulitis of great toe, left    Plan:  Patient was evaluated and treated and all questions answered.  S/p foot surgery left -Progressing as expected post-operatively. -WB Status: WBAT in surgical shoe -Medications: continue doxycycline.  -Foot redressed.  Follow-up in one week  with Dr. Logan Bores  No follow-ups on file. -op

## 2023-01-20 ENCOUNTER — Ambulatory Visit (INDEPENDENT_AMBULATORY_CARE_PROVIDER_SITE_OTHER): Payer: Medicare Other | Admitting: Podiatry

## 2023-01-20 DIAGNOSIS — L03032 Cellulitis of left toe: Secondary | ICD-10-CM

## 2023-01-20 NOTE — Progress Notes (Signed)
No chief complaint on file.   Subjective:  Patient presents today status post removal of orthopedic screw to the left great toe.  DOS: 01/07/2023.  Patient doing well.  The wound associated to the exposed screw head is completely healed.  There is no drainage or erythema.  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 Lynnette Caffey and Dr. Laneta Simmers   Thyroid disease     Past Surgical History:  Procedure Laterality Date   AORTIC VALVE REPLACEMENT  2009   Bioprosthetic at [facility]   CARDIAC CATHETERIZATION  2008   CHOLECYSTECTOMY  1982   COLONOSCOPY     CORONARY ARTERY BYPASS GRAFT  2009   SVG-PDA w/ AVR   CORRECTION HAMMER TOE  2011   BILATERAL   GASTROCNEMIUS RECESSION  05/28/2011   INTRAOPERATIVE TRANSTHORACIC ECHOCARDIOGRAM N/A 09/30/2021   Procedure: INTRAOPERATIVE TRANSTHORACIC ECHOCARDIOGRAM;  Surgeon: Orbie Pyo, MD;  [facility];  Service: Open Heart Surgery;  Laterality: N/A;   POLYPECTOMY     RIGHT/LEFT HEART CATH AND CORONARY/GRAFT ANGIOGRAPHY N/A 09/24/2021   Procedure: RIGHT/LEFT HEART CATH AND CORONARY/GRAFT ANGIOGRAPHY;  Surgeon: Orbie Pyo, MD;  [facility];  Service: Cardiovascular;  Laterality: N/A;   TONSILLECTOMY     TOTAL HIP ARTHROPLASTY Left 2000   TOTAL KNEE ARTHROPLASTY  2000   left   TOTAL KNEE ARTHROPLASTY  12/01/2011   Procedure: TOTAL KNEE ARTHROPLASTY;  Surgeon: Shelda Pal, MD;   [facility]: [facility];  Service: Orthopedics;  Laterality: Right;   TRANSCATHETER AORTIC VALVE REPLACEMENT, TRANSFEMORAL Right 09/30/2021   Procedure: Transcatheter Aortic Valve Replacement, Transfemoral;  Surgeon: Orbie Pyo, MD;  [facility];  Service: Open Heart Surgery;  Laterality: Right;   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   Procedure: TRANSURETHRAL RESECTION OF BLADDER TUMOR (TURBT) BILATERAL RETROGRADE PYELOGRAM;  Surgeon: Crista Elliot, MD;  [facility];  Service: Urology;  Laterality: Bilateral;  45 MINS FOR CASE   WEIL OSTEOTOMY  05/28/2011    Allergies  Allergen Reactions   Sulfa Antibiotics Other (See Comments)    Lurline Hare Syndrome   Bactrim [Sulfamethoxazole-Trimethoprim] Other (See Comments)    Lurline Hare Syndrome    Objective/Physical Exam Neurovascular status intact.  Complete resolution of the erythema and edema associated to the left great toe.  The open wound to the underlying nailbed is completely healed.  No drainage.  Assessment: 1. s/p removal of orthopedic screw left great toe. DOS: 01/07/2023   Plan of Care:  -Patient was evaluated.  -Patient may discontinue the postop shoe.  Recommend good supportive shoes and sneakers -Return to full activity no restrictions -Return to clinic 3 months routine footcare  Felecia Shelling, DPM [facility]  Dr. Felecia Shelling, DPM    [address]  [address] [place] [address]                Office [phone]  Fax [phone]

## 2023-01-23 NOTE — Progress Notes (Unsigned)
Cardiology Office Note   Date:  01/25/2023   ID:  Luis Herrera, DOB Jul 16, 1940, MRN [id number]  PCP:  Deeann Saint, MD  Cardiologist:   Dietrich Pates, MD   Pt presents for follow up of CAD and HTN    History of Present Illness: Luis Herrera is a 82 y.o. 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    Pt  is also followed by Rosette Reveal   Amiodarone is being used to suppress PVCs    I saw the pt on 10/01/22  His BP was very  low at the time and he was very symptaomatic  His meds were decreased ( Stopped Entresto; cut.  Told tod stop amlodipine but ? If pt continued)  Echo in 10/02/22 showed LVEF 50%   TAVR valve mean gradient 20mm HG, down from previous.  Mild perivalvular AI.  UA showed UTI and he was treated    The pt was seen by Philomena Course on 8/30   HE was still hypotensive.  As still taking Entresto.   This was stopped     The pt says that he held this for a day   Then BP went up and he resumed Entresto      I saw the tp in Sept 2024  His BP was improved   He had started carvedilol and Entresto  Since seen in September he continues to do well  Breathing is good  No dizziness   No CP   Some chronic LE edema but not bad    Denies palpitations    Current Meds  Medication Sig   amiodarone (PACERONE) 200 MG tablet TAKE ONE-HALF TABLET BY MOUTH  DAILY   aspirin EC 81 MG tablet Take 1 tablet (81 mg total) by mouth daily. Swallow whole.   atorvastatin (LIPITOR) 80 MG tablet TAKE 1 TABLET BY MOUTH ONCE  DAILY   Calcium Carb-Cholecalciferol (CALCIUM PLUS VITAMIN D3 PO) Take 2 tablets by mouth daily. Gummy supplements   carvedilol (COREG) 3.125 MG tablet Take 1 tablet (3.125 mg total) by mouth 2 (two) times daily.   cefUROXime (CEFTIN) 250 MG tablet Take 1 tablet (250 mg total) by  mouth 2 (two) times daily with a meal.   ezetimibe (ZETIA) 10 MG tablet TAKE 1 TABLET BY MOUTH EVERY DAY   furosemide (LASIX) 40 MG tablet TAKE ONE TABLET (40 MG) EVERY OTHER DAY ALTERNATING WITH 1 1/2 TABLETS (60 MG) ON THE OPPOSITE DAYS.   levothyroxine (SYNTHROID) 50 MCG tablet TAKE 1 TABLET BY MOUTH DAILY  BEFORE BREAKFAST   Multiple Vitamins-Minerals (PRESERVISION AREDS 2) CAPS Take 1 capsule by mouth 2 (two) times daily.   tamsulosin (FLOMAX) 0.4 MG CAPS capsule Take 0.4 mg by mouth in the morning and at bedtime.   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 Lynnette Caffey and Dr. Laneta Simmers   Thyroid disease     Past Surgical History:  Procedure Laterality Date   AORTIC VALVE REPLACEMENT  2009   [facility]oprosthetic at [facility]   CARDIAC CATHETERIZATION  2008   CHOLECYSTECTOMY  1982   COLONOSCOPY     CORONARY ARTERY BYPASS GRAFT  2009   SVG-PDA w/ AVR   CORRECTION HAMMER TOE  2011   BILATERAL   GASTROCNEMIUS RECESSION  05/28/2011   INTRAOPERATIVE TRANSTHORACIC ECHOCARDIOGRAM N/A 09/30/2021   Procedure: INTRAOPERATIVE TRANSTHORACIC ECHOCARDIOGRAM;  Surgeon: Orbie Pyo, MD;  [facility];  Service: Open Heart Surgery;  Laterality: N/A;   POLYPECTOMY     RIGHT/LEFT HEART CATH AND CORONARY/GRAFT ANGIOGRAPHY N/A 09/24/2021   Procedure: RIGHT/LEFT HEART  CATH AND CORONARY/GRAFT ANGIOGRAPHY;  Surgeon: Orbie Pyo, MD;  [facility];  Service: Cardiovascular;  Laterality: N/A;   TONSILLECTOMY     TOTAL HIP ARTHROPLASTY Left 2000   TOTAL KNEE ARTHROPLASTY  2000   left   TOTAL KNEE ARTHROPLASTY  12/01/2011   Procedure: TOTAL KNEE ARTHROPLASTY;  Surgeon: Shelda Pal, MD;  [facility];  Service: Orthopedics;  Laterality: Right;   TRANSCATHETER AORTIC VALVE REPLACEMENT, TRANSFEMORAL Right 09/30/2021   Procedure: Transcatheter Aortic Valve Replacement, Transfemoral;  Surgeon: Orbie Pyo, MD;  [facility];  Service: Open Heart Surgery;  Laterality: Right;   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 His smoking use included cigarettes. He has never used smokeless tobacco. He reports that he does not currently use alcohol. He reports that he does not use drugs.   Family History:  The patient's family history includes Colon cancer (age of onset: 6) in his mother; Heart attack in his brother and father; Hyperlipidemia in an other family member; Hypertension in his brother, sister, and another family member.    ROS:  Please see the history of present illness. All other systems are reviewed and  Negative to the above problem except as noted.    PHYSICAL EXAM: VS:  BP 124/62   Pulse 64   Ht 6' (1.829 m)   Wt 206 lb 12.8 oz (93.8 kg)   SpO2 99%   BMI 28.05 kg/m    GEN: PT is an 82 year-old in no acute distress  HEENT: normal  Neck: JVP is not elevated  Cardiac: RRR; Gr I/VI systolic murmur base  Triv to 1+ LE edema (wearing support socks) Respiratory:  clear to  auscultation GI: soft, nontender, No hepatomegaly    EKG:  EKG not done      Echo 10/02/22   1. Left ventricular ejection fraction, by estimation, is 50%. The left  ventricle has mildly decreased function. The left ventricle demonstrates  global hypokinesis. There is mild concentric left ventricular hypertrophy.  Left ventricular diastolic  parameters are consistent  with Grade I diastolic dysfunction (impaired  relaxation).   2. Right ventricular systolic function is mildly reduced. The right  ventricular size is normal. Tricuspid regurgitation signal is inadequate  for assessing PA pressure.   3. Left atrial size was mildly dilated.   4. The mitral valve is normal in structure. Trivial mitral valve  regurgitation. No evidence of mitral stenosis. Moderate mitral annular  calcification.   5. S/p valve-in-valve TAVR (previous SAVR). Mild peri-valvular leakage.  Mean gradient 20 mmHg. This is elevated but lower than previous echo. EOA  1.19 cm^2 with dimensionless index 0.3.   6. IVC not visualized.   CT   Sept 2024   1. No evidence of HALT or HAM on TAVR prosthesis to explain increase in gradients. Normal leaflet thickness. No thrombus or pannus.   2. No evidence of valve dehiscence. TAVR prosthesis is well seated and well apposed to the prior surgical aortic valve prosthesis. No CT findings to suggest source of paravalvular regurgitation.    Echo  Sept   2023   1. Left ventricular ejection fraction, by estimation, is 50 to 55%. The  left ventricle has low normal function. The left ventricle demonstrates  global hypokinesis. There is moderate left ventricular hypertrophy. Left  ventricular diastolic parameters are   consistent with Grade II diastolic dysfunction (pseudonormalization).  Elevated left atrial pressure.   2. Right ventricular systolic function is normal. The right ventricular  size is mildly enlarged.   3. Left atrial size was moderately dilated.   4. The  mitral valve is degenerative. Mild to moderate mitral valve  regurgitation.   5. Tricuspid valve regurgitation is mild to moderate.   6. The major aortic regurgitant jet appears to be perivalvular at 2-3  o'clock, but there may be an intravalvular component. Aortic valve  gradients are higher, probably in part due to AI-related increased stroke  volume. However, the dimensionless index  has decreased, which may signify a true reduction in the effective valve  area. The aortic valve has been repaired/replaced. Aortic valve  regurgitation is moderate. There is a Edwards valve in valve present in  the aortic position. Procedure Date:  09/30/2021. Aortic regurgitation PHT measures 354 msec. Aortic valve mean  gradient measures 28.0 mmHg. Aortic valve Vmax measures 3.64 m/s. Aortic  valve acceleration time measures 100 msec.   Comparison(s): Aortic insufficiency has worsened and aortic valve  gradients are higher. The aortic ejection jet remains relatively early  peaking (acceleration time 100 ms). Suspect the pulsed Doppler sample  volume is placed too deep in the ventricle,  which leads to underestimation of the dimensionless index and the aortic  valve area.    [place]    01/09/20  Study Highlights  1. NSR with sinus brady and sinus tachycardia 2. Frequent PVC's and PAC's 3. NS AT 4. Rare triplets 5. Bigeminal and trigeminal PVC's  Lipid Panel    Component Value Date/Time   CHOL 158 05/19/2022 1248   TRIG 91 05/19/2022 1248   HDL 52 05/19/2022 1248   CHOLHDL 3.0 05/19/2022 1248   CHOLHDL 3 11/14/2015 0954   VLDL 16.8 11/14/2015 0954   LDLCALC 89 05/19/2022 1248      Wt Readings from Last 3 Encounters:  01/25/23 206 lb 12.8 oz (93.8 kg)  01/06/23 204 lb (92.5 kg)  10/30/22 204 lb 6.4 oz (92.7 kg)      ASSESSMENT AND PLAN:   1 Blood pressure   Pt doing much better now   Back on  meds    ? 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  The patient does not have concerns regarding medicines.  Signed, Dietrich Pates, MD  01/25/2023 7:20 PM    [facility] [address] [place], [place], [place]  [address] Phone: [phone]; Fax: [phone]

## 2023-01-25 ENCOUNTER — Encounter: Payer: Self-pay | Admitting: Internal Medicine

## 2023-01-25 ENCOUNTER — Ambulatory Visit: Payer: Medicare Other | Attending: Internal Medicine | Admitting: Internal Medicine

## 2023-01-25 VITALS — BP 124/62 | HR 64 | Ht 72.0 in | Wt 206.8 lb

## 2023-01-25 DIAGNOSIS — Z79899 Other long term (current) drug therapy: Secondary | ICD-10-CM | POA: Diagnosis not present

## 2023-01-25 DIAGNOSIS — I350 Nonrheumatic aortic (valve) stenosis: Secondary | ICD-10-CM | POA: Diagnosis not present

## 2023-01-25 DIAGNOSIS — I5031 Acute diastolic (congestive) heart failure: Secondary | ICD-10-CM | POA: Diagnosis not present

## 2023-01-25 DIAGNOSIS — I10 Essential (primary) hypertension: Secondary | ICD-10-CM

## 2023-01-25 NOTE — Patient Instructions (Signed)
Medication Instructions:  Your physician recommends that you continue on your current medications as directed. Please refer to the Current Medication list given to you today.  *If you need a refill on your cardiac medications before your next appointment, please call your pharmacy*   Lab Work: Please complete a CMET, BNP, TSH and CBC at the [facility] of your choice.  If you have labs (blood work) drawn today and your tests are completely normal, you will receive your results only by: MyChart Message (if you have MyChart) OR A paper copy in the mail If you have any lab test that is abnormal or we need to change your treatment, we will call you to review the results.   Testing/Procedures: None.   Follow-Up:     Your next appointment:   5 month(s) (May 2025)  Provider:   Dietrich Pates, MD

## 2023-01-26 DIAGNOSIS — I5031 Acute diastolic (congestive) heart failure: Secondary | ICD-10-CM | POA: Diagnosis not present

## 2023-01-26 DIAGNOSIS — Z79899 Other long term (current) drug therapy: Secondary | ICD-10-CM | POA: Diagnosis not present

## 2023-01-27 LAB — CBC WITH DIFFERENTIAL/PLATELET
Basophils Absolute: 0 10*3/uL (ref 0.0–0.2)
Basos: 0 %
EOS (ABSOLUTE): 0.5 10*3/uL — ABNORMAL HIGH (ref 0.0–0.4)
Eos: 7 %
Hematocrit: 31.1 % — ABNORMAL LOW (ref 37.5–51.0)
Hemoglobin: 10.1 g/dL — ABNORMAL LOW (ref 13.0–17.7)
Immature Grans (Abs): 0 10*3/uL (ref 0.0–0.1)
Immature Granulocytes: 0 %
Lymphocytes Absolute: 1.2 10*3/uL (ref 0.7–3.1)
Lymphs: 18 %
MCH: 31.8 pg (ref 26.6–33.0)
MCHC: 32.5 g/dL (ref 31.5–35.7)
MCV: 98 fL — ABNORMAL HIGH (ref 79–97)
Monocytes Absolute: 0.4 10*3/uL (ref 0.1–0.9)
Monocytes: 6 %
Neutrophils Absolute: 4.7 10*3/uL (ref 1.4–7.0)
Neutrophils: 69 %
Platelets: 242 10*3/uL (ref 150–450)
RBC: 3.18 x10E6/uL — ABNORMAL LOW (ref 4.14–5.80)
RDW: 12.6 % (ref 11.6–15.4)
WBC: 6.9 10*3/uL (ref 3.4–10.8)

## 2023-01-27 LAB — COMPREHENSIVE METABOLIC PANEL
ALT: 16 IU/L (ref 0–44)
AST: 33 IU/L (ref 0–40)
Albumin: 3.9 g/dL (ref 3.7–4.7)
Alkaline Phosphatase: 118 IU/L (ref 44–121)
BUN/Creatinine Ratio: 22 (ref 10–24)
BUN: 23 mg/dL (ref 8–27)
Bilirubin Total: 1.4 mg/dL — ABNORMAL HIGH (ref 0.0–1.2)
CO2: 23 mmol/L (ref 20–29)
Calcium: 8.9 mg/dL (ref 8.6–10.2)
Chloride: 105 mmol/L (ref 96–106)
Creatinine, Ser: 1.05 mg/dL (ref 0.76–1.27)
Globulin, Total: 2.5 g/dL (ref 1.5–4.5)
Glucose: 105 mg/dL — ABNORMAL HIGH (ref 70–99)
Potassium: 4.3 mmol/L (ref 3.5–5.2)
Sodium: 140 mmol/L (ref 134–144)
Total Protein: 6.4 g/dL (ref 6.0–8.5)
eGFR: 71 mL/min/{1.73_m2} (ref >59)

## 2023-01-27 LAB — PRO B NATRIURETIC PEPTIDE: NT-Pro BNP: 284 pg/mL (ref 0–486)

## 2023-01-27 LAB — TSH: TSH: 4.32 u[IU]/mL (ref 0.450–4.500)

## 2023-02-03 ENCOUNTER — Encounter: Payer: Medicare Other | Admitting: Podiatry

## 2023-02-11 ENCOUNTER — Other Ambulatory Visit: Payer: Self-pay | Admitting: Internal Medicine

## 2023-02-25 ENCOUNTER — Other Ambulatory Visit: Payer: Self-pay | Admitting: Internal Medicine

## 2023-02-25 ENCOUNTER — Telehealth: Payer: Self-pay | Admitting: Internal Medicine

## 2023-02-25 MED ORDER — ENTRESTO 24-26 MG PO TABS: 1.0000 | ORAL_TABLET | Freq: Two times a day (BID) | ORAL | 3 refills | Status: DC

## 2023-02-25 NOTE — Telephone Encounter (Signed)
 See My Cart messages... Entresto sent in okay with Dr Tenny Craw.

## 2023-02-25 NOTE — Telephone Encounter (Signed)
 Pt requesting refill on Entresto . Medication is not showing up on current medication list. Shows under discontinued medications, pt states that he has been taking medication and took his last one this morning. [facility] on [facility]. Please advise

## 2023-02-25 NOTE — Telephone Encounter (Signed)
 Called pt reports he has taken Entresto  without interruption.  Currently not at home unable to give BP readings.  Reports BP run 120-135/60-70. Advised pt I can not refill medication without the okay of Dr. Okey.  Advised she is not in the office today but I will send message to be addressed.  Pt asked that I send message to Jenkins also she will get things straightened out.   Myles Jenkins is not in the office.  I will send message to MD and Nurse to f/u.  Pt reports needs a quick response as he only has 1 day of meds left.

## 2023-03-03 ENCOUNTER — Ambulatory Visit (INDEPENDENT_AMBULATORY_CARE_PROVIDER_SITE_OTHER): Payer: Medicare Other | Admitting: Podiatry

## 2023-03-03 ENCOUNTER — Encounter: Payer: Self-pay | Admitting: Podiatry

## 2023-03-03 ENCOUNTER — Ambulatory Visit (INDEPENDENT_AMBULATORY_CARE_PROVIDER_SITE_OTHER): Payer: Medicare Other

## 2023-03-03 DIAGNOSIS — L03032 Cellulitis of left toe: Secondary | ICD-10-CM

## 2023-03-03 DIAGNOSIS — M778 Other enthesopathies, not elsewhere classified: Secondary | ICD-10-CM | POA: Diagnosis not present

## 2023-03-03 MED ORDER — DOXYCYCLINE HYCLATE 100 MG PO TABS: 100.0000 mg | ORAL_TABLET | Freq: Two times a day (BID) | ORAL | 0 refills | Status: DC

## 2023-03-03 NOTE — Progress Notes (Signed)
 Chief Complaint  Patient presents with   Toe Pain    Patient states he wants doctor to dubble check and make sure his left hallux is ok , his left hallux is a little swollen and red .     Subjective:  Patient presents today status post removal of orthopedic screw to the left great toe.  DOS: 01/07/2023.  Since last visit the patient has noticed some increased redness to the toe.  No pain.  No open wound or drainage.  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 Lorie Rook and Dr. Sherene Dilling   Thyroid  disease     Past Surgical History:  Procedure Laterality Date   AORTIC VALVE REPLACEMENT  2009   [facility]oprosthetic at [facility]   CARDIAC CATHETERIZATION  2008   CHOLECYSTECTOMY  1982   COLONOSCOPY     CORONARY ARTERY BYPASS GRAFT  2009   SVG-PDA w/ AVR   CORRECTION HAMMER TOE  2011   BILATERAL   GASTROCNEMIUS RECESSION  05/28/2011   INTRAOPERATIVE TRANSTHORACIC ECHOCARDIOGRAM N/A 09/30/2021   Procedure: INTRAOPERATIVE TRANSTHORACIC ECHOCARDIOGRAM;  Surgeon: Kyra Phy, MD;  [facility];  Service: Open Heart Surgery;  Laterality: N/A;   POLYPECTOMY     RIGHT/LEFT HEART CATH AND CORONARY/GRAFT ANGIOGRAPHY N/A 09/24/2021   Procedure: RIGHT/LEFT HEART CATH AND CORONARY/GRAFT ANGIOGRAPHY;  Surgeon: Kyra Phy, MD;  [facility];  Service: Cardiovascular;  Laterality: N/A;   TONSILLECTOMY      TOTAL HIP ARTHROPLASTY Left 2000   TOTAL KNEE ARTHROPLASTY  2000   left   TOTAL KNEE ARTHROPLASTY  12/01/2011   Procedure: TOTAL KNEE ARTHROPLASTY;  Surgeon: Bevin Bucks, MD;  [facility];  Service: Orthopedics;  Laterality: Right;   TRANSCATHETER AORTIC VALVE REPLACEMENT, TRANSFEMORAL Right 09/30/2021   Procedure: Transcatheter Aortic Valve Replacement, Transfemoral;  Surgeon: Kyra Phy, MD;  [facility];  Service: Open Heart Surgery;  Laterality: Right;   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   Procedure: TRANSURETHRAL RESECTION OF BLADDER TUMOR (TURBT) BILATERAL RETROGRADE PYELOGRAM;  Surgeon: Samson Croak, MD;  [facility];  Service: Urology;  Laterality: Bilateral;  45 MINS FOR CASE   WEIL OSTEOTOMY  05/28/2011    Allergies  Allergen Reactions   Sulfa  Antibiotics Other (See Comments)    Arita Belch Syndrome   Bactrim  [Sulfamethoxazole -Trimethoprim ] Other (See Comments)    Arita Belch Syndrome    LT great toe 03/03/2023  Objective/Physical Exam Neurovascular status intact.  Absence of the nail plate noted.  The nailbed appears stable with well adhered callus tissue and debris.  Please see above noted photo.  There is some slight redness associated to the great toe.  No significant erythema however or heat and there is no open wound.  Radiographic exam LT  foot 03/03/2023 The great toe appears stable with no osseous erosions or acute fractures identified.  The remaining orthopedic screw within the great toe appears stable with no significant change since prior x-rays.  There actually appears to be some osseous reconsolidation around the removed orthopedic screw.  No erosions or irregularities concerning for osteomyelitis but we will observe for now  Assessment: 1. s/p removal of orthopedic screw left great toe. DOS: 01/07/2023 2.  Cellulitis left great toe  Plan of Care:  -Patient was evaluated.  - X-rays  reviewed -For now we are going to simply monitor with conservative treatment and oral antibiotics -Prescription for doxycycline  100 mg twice daily x 10 days -Return to clinic 2 weeks  Dot Gazella, DPM [facility]  Dr. Dot Gazella, DPM    [address] [place], [place] [address]                Office [phone]  Fax [phone]

## 2023-03-28 DIAGNOSIS — S0181XA Laceration without foreign body of other part of head, initial encounter: Secondary | ICD-10-CM | POA: Diagnosis not present

## 2023-03-28 DIAGNOSIS — I10 Essential (primary) hypertension: Secondary | ICD-10-CM | POA: Diagnosis not present

## 2023-03-28 DIAGNOSIS — R519 Headache, unspecified: Secondary | ICD-10-CM | POA: Diagnosis not present

## 2023-03-28 DIAGNOSIS — S01111A Laceration without foreign body of right eyelid and periocular area, initial encounter: Secondary | ICD-10-CM | POA: Diagnosis not present

## 2023-03-28 DIAGNOSIS — S0990XA Unspecified injury of head, initial encounter: Secondary | ICD-10-CM | POA: Diagnosis not present

## 2023-03-30 ENCOUNTER — Encounter: Payer: Self-pay | Admitting: Internal Medicine

## 2023-04-07 ENCOUNTER — Encounter: Payer: Medicare Other | Admitting: Podiatry

## 2023-05-03 ENCOUNTER — Other Ambulatory Visit: Payer: Self-pay | Admitting: Internal Medicine

## 2023-05-12 ENCOUNTER — Ambulatory Visit: Payer: Medicare Other | Admitting: Podiatry

## 2023-05-19 ENCOUNTER — Ambulatory Visit: Payer: Medicare Other | Admitting: Podiatry

## 2023-05-19 ENCOUNTER — Encounter: Payer: Self-pay | Admitting: Podiatry

## 2023-05-19 VITALS — Ht 72.0 in | Wt 206.0 lb

## 2023-05-19 DIAGNOSIS — M79674 Pain in right toe(s): Secondary | ICD-10-CM

## 2023-05-19 DIAGNOSIS — M79675 Pain in left toe(s): Secondary | ICD-10-CM | POA: Diagnosis not present

## 2023-05-19 DIAGNOSIS — B351 Tinea unguium: Secondary | ICD-10-CM | POA: Diagnosis not present

## 2023-05-23 NOTE — Progress Notes (Signed)
 Subjective:  Patient ID: Luis Herrera, male    DOB: 11/30/1940,  MRN: [id number]  Luis Herrera presents to clinic today for painful porokeratotic lesion(s) of both feet and painful mycotic toenails that limit ambulation. Painful toenails interfere with ambulation. Aggravating factors include wearing enclosed shoe gear. Pain is relieved with periodic professional debridement. Painful porokeratotic lesions are aggravated when weightbearing with and without shoegear. Pain is relieved with periodic professional debridement.  Patient states he recently had hardware removed from left great toe due to infection. States toe looks and feels much better since having procedure performed. Chief Complaint  Patient presents with   RFC    " I am here for a nail trim and possible corn on the bottom of my foot, PCP is dr banks and seen as rarely as possible,    New problem(s): None.   PCP is Deeann Saint, MD.  Allergies  Allergen Reactions   Sulfa Antibiotics Other (See Comments)    Lurline Hare Syndrome   Bactrim [Sulfamethoxazole-Trimethoprim] Other (See Comments)    Lurline Hare Syndrome   Review of Systems: Negative except as noted in the HPI.  Objective: No changes noted in today's physical examination. There were no vitals filed for this visit. Luis Herrera is a pleasant 83 y.o. male in NAD. AAO x 3.  Vascular Examination: CFT <3 seconds b/l. DP/PT pulses faintly palpable b/l. Skin temperature gradient warm to warm b/l. No pain with calf compression. No ischemia or gangrene. No cyanosis or clubbing noted b/l. Trace edema noted BLE.   Neurological Examination: Sensation grossly intact b/l with 10 gram monofilament.   Dermatological Examination: Pedal skin warm and supple b/l.   No open wounds. No interdigital macerations.  Toenails 1-5 b/l thick, discolored, elongated with subungual debris and pain on dorsal palpation.    Well healed surgical scar(s) noted left foot. Toenails 2-5  bilaterally elongated, discolored, dystrophic, thickened, and crumbly with subungual debris and tenderness to dorsal palpation. Anonychia noted L hallux. Nailbed(s) epithelialized.  Porokeratotic lesion(s) left heel and submet head 5 left foot. No erythema, no edema, no drainage, no fluctuance.  Musculoskeletal Examination: Muscle strength 5/5 to all lower extremity muscle groups bilaterally. No pain, crepitus or joint limitation noted with ROM bilateral LE. No gross bony deformities bilaterally.  Radiographs: None  Assessment/Plan: 1. Pain due to onychomycosis of toenails of both feet     -Patient was evaluated today. All questions/concerns addressed on today's visit. -Will submit preauthorization to insurance company for paring of painful porokeratoses left foot. -Patient to continue soft, supportive shoe gear daily. -Toenails were debrided in length and girth 2-5 bilaterally and right great toe with sterile nail nippers and dremel without iatrogenic bleeding.  -As a courtesy, porokeratotic lesion(s) left heel and submet head 5 left foot pared and enucleated without complication or incident. Total number pared=2. -Patient/POA to call should there be question/concern in the interim.   Return in about 9 weeks (around 07/21/2023).  Freddie Breech, DPM      [place] LOCATION: [address] [place] [address]                   Office [phone]   [place] LOCATION: [address]  [place], [place] [address] Office [phone]

## 2023-05-25 ENCOUNTER — Telehealth: Payer: Self-pay | Admitting: Internal Medicine

## 2023-05-25 MED ORDER — AMLODIPINE BESYLATE 5 MG PO TABS: 5.0000 mg | ORAL_TABLET | Freq: Every day | ORAL | 2 refills | Status: DC

## 2023-05-25 NOTE — Telephone Encounter (Signed)
 Pt's medication was sent to pt's pharmacy as requested. Confirmation received.

## 2023-05-25 NOTE — Telephone Encounter (Signed)
*  STAT* If patient is at the pharmacy, call can be transferred to refill team.   1. Which medications need to be refilled? (please list name of each medication and dose if known) amLODipine (NORVASC) 5 MG tablet  Take 5 mg by mouth daily.   2. Would you like to learn more about the convenience, safety, & potential cost savings by using the [facility]? No   3. Are you open to using the [facility] No   4. Which pharmacy/location (including street and city if local pharmacy) is medication to be sent to?[id number] - [place], [place] - [address]    5. Do they need a 30 day or 90 day supply? 90 Day Supply

## 2023-06-01 ENCOUNTER — Other Ambulatory Visit: Payer: Self-pay | Admitting: Internal Medicine

## 2023-06-06 NOTE — Progress Notes (Signed)
 Cardiology Office Note   Date:  06/07/2023   ID:  Luis Herrera, DOB 06-30-1940, MRN [id number]  PCP:  Viola Greulich, MD  Cardiologist:   Ola Berger, MD   Pt presents for follow up of CAD and blood pressure    History of Present Illness: Luis Herrera is a 83 y.o. 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    Pt  is also followed by Alesia Husky   Amiodarone  is being used to suppress PVCs    I saw the pt on 10/01/22  His BP was very  low at the time and he was very symptomatic  His meds were decreased ( Stopped Entresto ; Told to stop amlodipine  but ? If pt continued)  Echo in 10/02/22 showed LVEF 50%   TAVR valve mean gradient 20mm HG, down from previous.  Mild perivalvular AI.  UA showed UTI and he was treated    The pt was seen by Yvonna Herder on 10/16/22   He was still hypotensive.  Actually still taking Entresto .   This was then stopped and his BP improved    Over time he resumed both carvedilol  and Entresto   BP has been maintained.    I saw the pt in clinic in Dec 2024  Since then he continues to do well  He denies dizziness  Breathing is good   No CP Legs with stable mild edema(L greater than R; wars support hose)   Current Meds  Medication Sig   amiodarone  (PACERONE ) 200 MG tablet TAKE 1/2 TABLET BY MOUTH DAILY   amLODipine  (NORVASC ) 5 MG tablet Take 1 tablet (5 mg total) by mouth daily.   aspirin  EC 81 MG tablet Take 1 tablet (81 mg total) by mouth daily. Swallow whole.   atorvastatin  (LIPITOR ) 80 MG tablet TAKE 1 TABLET BY MOUTH ONCE  DAILY   Calcium  Carb-Cholecalciferol (CALCIUM  PLUS VITAMIN D3 PO) Take 2 tablets by mouth daily. Gummy supplements   carvedilol  (COREG ) 3.125 MG tablet Take 1 tablet (3.125 mg total) by mouth 2 (two) times daily.   cephALEXin  (KEFLEX )  250 MG capsule Take 250 mg by mouth at bedtime.   ezetimibe  (ZETIA ) 10 MG tablet TAKE 1 TABLET BY MOUTH EVERY DAY   furosemide  (LASIX ) 40 MG tablet TAKE ONE TABLET (40 MG) EVERY OTHER DAY ALTERNATING WITH 1 1/2 TABLETS (60 MG) ON THE OPPOSITE DAYS.   levothyroxine  (SYNTHROID ) 50 MCG tablet TAKE 1 TABLET BY MOUTH DAILY  BEFORE BREAKFAST   Multiple Vitamins-Minerals (PRESERVISION AREDS 2) CAPS Take 1 capsule by mouth 2 (two) times daily.   sacubitril -valsartan  (ENTRESTO ) 24-26 MG Take 1 tablet by mouth 2 (two) times daily.   tamsulosin  (FLOMAX ) 0.4 MG CAPS capsule Take 0.4 mg by mouth in the morning and at bedtime.     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 Lorie Rook and Dr. Sherene Dilling   Thyroid  disease     Past Surgical History:  Procedure Laterality Date   AORTIC VALVE REPLACEMENT  2009   Bioprosthetic at [facility]   CARDIAC CATHETERIZATION  2008   CHOLECYSTECTOMY  1982   COLONOSCOPY     CORONARY ARTERY BYPASS GRAFT  2009   SVG-PDA w/ AVR   CORRECTION HAMMER TOE  2011   BILATERAL   GASTROCNEMIUS RECESSION  05/28/2011   INTRAOPERATIVE TRANSTHORACIC ECHOCARDIOGRAM N/A 09/30/2021   Procedure: INTRAOPERATIVE TRANSTHORACIC ECHOCARDIOGRAM;  Surgeon: Kyra Phy, MD;  Location: [facility] INVASIVE CV LAB;  Service: Open Heart Surgery;  Laterality: N/A;   POLYPECTOMY     RIGHT/LEFT HEART CATH AND CORONARY/GRAFT ANGIOGRAPHY N/A 09/24/2021   Procedure:  RIGHT/LEFT HEART CATH AND CORONARY/GRAFT ANGIOGRAPHY;  Surgeon: Kyra Phy, MD;  Location: [facility] INVASIVE CV LAB;  Service: Cardiovascular;  Laterality: N/A;   TONSILLECTOMY     TOTAL HIP ARTHROPLASTY Left 2000   TOTAL KNEE ARTHROPLASTY  2000   left   TOTAL KNEE ARTHROPLASTY  12/01/2011   Procedure: TOTAL KNEE ARTHROPLASTY;  Surgeon: Bevin Bucks, MD;  Location: [facility] ORS;  Service: Orthopedics;  Laterality: Right;   TRANSCATHETER AORTIC VALVE REPLACEMENT, TRANSFEMORAL Right 09/30/2021   Procedure: Transcatheter Aortic Valve Replacement, Transfemoral;  Surgeon: Kyra Phy, MD;  Location: [facility]VAS[facility] CV LAB;  Service: Open Heart Surgery;  Laterality: Right;   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 His smoking use included cigarettes. He has never used smokeless tobacco. He reports that he does not currently use alcohol. He reports that he does not use drugs.   Family History:  The patient's family history includes Colon cancer (age of onset: 71) in his mother; Heart attack in his brother and father; Hyperlipidemia in an other family member; Hypertension in his brother, sister, and another family member.    ROS:  Please see the history of present illness. All other systems are reviewed and  Negative to the above problem except as noted.    PHYSICAL EXAM: VS:  BP (!) 142/64   Pulse 66   Ht 6' (1.829 m)   Wt 207 lb 3.2 oz (94 kg)   SpO2 98%   BMI 28.10 kg/m    GEN: Pt is in NAD HEENT: normal  Neck: JVP is not elevated  Cardiac: RRR; Gr I/VI systolic murmur base  Triv  LE edema (wearing support socks) Respiratory:  clear to auscultation  bilaterally GI: soft, nontender, No hepatomegaly    EKG:  EKG not done      Echo 10/02/22   1. Left ventricular ejection fraction, by estimation, is 50%. The left  ventricle has mildly decreased function. The left ventricle demonstrates  global hypokinesis. There is mild concentric left ventricular hypertrophy.  Left ventricular diastolic  parameters are consistent with Grade I diastolic dysfunction (impaired  relaxation).   2. Right ventricular systolic function is mildly reduced. The right  ventricular size is normal. Tricuspid regurgitation signal is inadequate  for assessing PA pressure.   3. Left atrial size was mildly dilated.   4. The mitral valve is normal in structure. Trivial mitral valve  regurgitation. No evidence of mitral stenosis. Moderate mitral annular  calcification.   5. S/p valve-in-valve TAVR (previous SAVR). Mild peri-valvular leakage.  Mean gradient 20 mmHg. This is elevated but lower than previous echo. EOA  1.19 cm^2 with dimensionless index 0.3.   6. IVC not visualized.   CT   Sept 2024   1. No evidence of HALT or HAM on TAVR prosthesis to explain increase in gradients. Normal leaflet thickness. No thrombus or pannus.   2. No evidence of valve dehiscence. TAVR prosthesis is well seated and well apposed to the prior surgical aortic valve prosthesis. No CT findings to suggest source of paravalvular regurgitation.    Echo  Sept   2023   1. Left ventricular ejection fraction, by estimation, is 50 to 55%. The  left ventricle has low normal function. The left ventricle demonstrates  global hypokinesis. There is moderate left ventricular hypertrophy. Left  ventricular diastolic parameters are   consistent with Grade II diastolic dysfunction (pseudonormalization).  Elevated left atrial pressure.   2. Right ventricular systolic function is normal. The right ventricular  size is mildly enlarged.   3. Left atrial size was moderately dilated.   4. The  mitral valve is degenerative. Mild to moderate mitral valve  regurgitation.   5. Tricuspid valve regurgitation is mild to moderate.   6. The major aortic regurgitant jet appears to be perivalvular at 2-3  o'clock, but there may be an intravalvular component. Aortic valve  gradients are higher, probably in part due to AI-related increased stroke  volume. However, the dimensionless index  has decreased, which may signify a true reduction in the effective valve  area. The aortic valve has been repaired/replaced. Aortic valve  regurgitation is moderate. There is a Edwards valve in valve present in  the aortic position. Procedure Date:  09/30/2021. Aortic regurgitation PHT measures 354 msec. Aortic valve mean  gradient measures 28.0 mmHg. Aortic valve Vmax measures 3.64 m/s. Aortic  valve acceleration time measures 100 msec.   Comparison(s): Aortic insufficiency has worsened and aortic valve  gradients are higher. The aortic ejection jet remains relatively early  peaking (acceleration time 100 ms). Suspect the pulsed Doppler sample  volume is placed too deep in the ventricle,  which leads to underestimation of the dimensionless index and the aortic  valve area.    [place]    01/09/20  Study Highlights  1. NSR with sinus brady and sinus tachycardia 2. Frequent PVC's and PAC's 3. NS AT 4. Rare triplets 5. Bigeminal and trigeminal PVC's  Lipid Panel    Component Value Date/Time   CHOL 158 05/19/2022 1248   TRIG 91 05/19/2022 1248   HDL 52 05/19/2022 1248   CHOLHDL 3.0 05/19/2022 1248   CHOLHDL 3 11/14/2015 0954   VLDL 16.8 11/14/2015 0954   LDLCALC 89 05/19/2022 1248      Wt Readings from Last 3 Encounters:  06/07/23 207 lb 3.2 oz (94 kg)  05/19/23 206 lb (93.4 kg)  01/25/23 206 lb 12.8 oz (93.8 kg)      ASSESSMENT AND PLAN:   1 Blood pressure  Blood pressure is a little high today  FOllow  IT has been better at other times    Keep on same meds     2  AV  dz   S/p TAVR  in Aug 2023    Echo in AUg 2024 , mean gradient was 20 mm Hg, a little lower than previous     LVEF normal    3   CAD LHC in 2023 prior to TAVR showed patent graft to RCA   LCoronary system OK    Pt remains symptom free    4  HFpEF Overall volume status is OK    5  HL   In sept 2024 LDL 66  HDL 55 Keep on lipitor   6  CV dz     Mild CV dz     7  Hx of PVCs  PT continues on low dose amiodarone  100 mg daily   HR is regular   Plan for follow up in the fall  Current medicines are reviewed at length with the patient today.  The patient does not have concerns regarding medicines.  Signed, Ola Berger, MD  06/07/2023 10:52 AM    [facility] [address] [place], [place], [place]  [address] Phone: [phone]; Fax: [phone]

## 2023-06-07 ENCOUNTER — Ambulatory Visit: Payer: Medicare Other | Attending: Internal Medicine | Admitting: Internal Medicine

## 2023-06-07 ENCOUNTER — Encounter: Payer: Self-pay | Admitting: Internal Medicine

## 2023-06-07 VITALS — BP 142/64 | HR 66 | Ht 72.0 in | Wt 207.2 lb

## 2023-06-07 DIAGNOSIS — I5030 Unspecified diastolic (congestive) heart failure: Secondary | ICD-10-CM | POA: Diagnosis not present

## 2023-06-07 NOTE — Patient Instructions (Signed)
 Medication Instructions:   *If you need a refill on your cardiac medications before your next appointment, please call your pharmacy*  Lab Work:  If you have labs (blood work) drawn today and your tests are completely normal, you will receive your results only by: MyChart Message (if you have MyChart) OR A paper copy in the mail If you have any lab test that is abnormal or we need to change your treatment, we will call you to review the results.  Testing/Procedures:   Follow-Up: At [facility], you and your health needs are our priority.  As part of our continuing mission to provide you with exceptional heart care, our providers are all part of one team.  This team includes your primary Cardiologist (physician) and Advanced Practice Providers or APPs (Physician Assistants and Nurse Practitioners) who all work together to provide you with the care you need, when you need it.  Your next appointment:  6 months with Dr Avanell Bob    We recommend signing up for the patient portal called "MyChart".  Sign up information is provided on this After Visit Summary.  MyChart is used to connect with patients for Virtual Visits (Telemedicine).  Patients are able to view lab/test results, encounter notes, upcoming appointments, etc.  Non-urgent messages can be sent to your provider as well.   To learn more about what you can do with MyChart, go to [email or web address].   Other Instructions       1st Floor: - Lobby - Registration  - Pharmacy  - Lab - Cafe  2nd Floor: - PV Lab - Diagnostic Testing (echo, CT, nuclear med)  3rd Floor: - Vacant  4th Floor: - TCTS (cardiothoracic surgery) - AFib Clinic - Structural Heart Clinic - Vascular Surgery  - Vascular Ultrasound  5th Floor: - HeartCare Cardiology (general and EP) - Clinical Pharmacy for coumadin, hypertension, lipid, weight-loss medications, and med management appointments    Valet parking services will be available as  well.

## 2023-06-17 ENCOUNTER — Other Ambulatory Visit: Payer: Self-pay | Admitting: Internal Medicine

## 2023-06-27 ENCOUNTER — Other Ambulatory Visit: Payer: Self-pay | Admitting: Internal Medicine

## 2023-06-28 ENCOUNTER — Encounter: Payer: Self-pay | Admitting: Internal Medicine

## 2023-06-28 DIAGNOSIS — Z952 Presence of prosthetic heart valve: Secondary | ICD-10-CM

## 2023-06-28 NOTE — Telephone Encounter (Signed)
 Pt of Dr. Avanell Bob and Dr. Carolynne Citron. As this is not a [facility] RX, should I refill this? Please advise

## 2023-07-01 ENCOUNTER — Other Ambulatory Visit: Payer: Self-pay

## 2023-07-01 MED ORDER — LEVOTHYROXINE SODIUM 50 MCG PO TABS: 50.0000 ug | ORAL_TABLET | Freq: Every day | ORAL | 3 refills | Status: AC

## 2023-07-01 NOTE — Telephone Encounter (Signed)
 I called the pt and he is asking if Dr Avanell Bob has had a chance to talk with Structural about when to repeat his Echo. I will send to Dr Avanell Bob.   Pt also asking if his grant for the Entresto  can be renewed since it is ending at the end of this month. I will send to our Pharm D for review.

## 2023-07-04 NOTE — Telephone Encounter (Signed)
 I spoke with Luis Herrera   After 1 year it is individualized   Not everyone gets echo His last echo was in Aug 2024   I would  get one in fall before I see him

## 2023-07-05 ENCOUNTER — Other Ambulatory Visit (HOSPITAL_COMMUNITY): Payer: Self-pay

## 2023-07-05 ENCOUNTER — Telehealth: Payer: Self-pay

## 2023-07-05 ENCOUNTER — Ambulatory Visit: Payer: Medicare Other | Admitting: Internal Medicine

## 2023-07-05 NOTE — Telephone Encounter (Signed)
 Patient Advocate Encounter   The patient was approved for a [employer] grant that will help cover the cost of ENTRESTO  Total amount awarded, $4,500.  Effective: 07/19/23 - 07/17/24   [id number]   Patient informed via Tilman Fonder, CPhT  Pharmacy Patient Advocate Specialist  Direct Number: [phone] Fax: [phone]

## 2023-07-05 NOTE — Telephone Encounter (Signed)
 Please assist with Entresto  Luis Herrera

## 2023-07-05 NOTE — Telephone Encounter (Signed)
 Renewed grant, informed pt via mychart

## 2023-07-26 ENCOUNTER — Ambulatory Visit: Admitting: Podiatry

## 2023-07-26 ENCOUNTER — Encounter: Payer: Self-pay | Admitting: Podiatry

## 2023-07-26 DIAGNOSIS — M79675 Pain in left toe(s): Secondary | ICD-10-CM | POA: Diagnosis not present

## 2023-07-26 DIAGNOSIS — B351 Tinea unguium: Secondary | ICD-10-CM

## 2023-07-26 DIAGNOSIS — M79674 Pain in right toe(s): Secondary | ICD-10-CM | POA: Diagnosis not present

## 2023-08-01 NOTE — Progress Notes (Signed)
  Subjective:  Patient ID: Luis Herrera, male    DOB: [birth year],  MRN: [id number]  Damante Spragg presents to clinic today for painful porokeratotic lesion(s) b/l feet and painful mycotic toenails that limit ambulation. Painful toenails interfere with ambulation. Aggravating factors include wearing enclosed shoe gear. Pain is relieved with periodic professional debridement. Painful porokeratotic lesions are aggravated when weightbearing with and without shoegear. Pain is relieved with periodic professional debridement.  Chief Complaint  Patient presents with   Nail Problem    Thick, painful toenails, 9 week follow up   New problem(s): None.   PCP is Viola Greulich, MD.  Allergies  Allergen Reactions   Sulfa  Antibiotics Other (See Comments)    Arita Belch Syndrome   Bactrim  [Sulfamethoxazole -Trimethoprim ] Other (See Comments)    Arita Belch Syndrome    Review of Systems: Negative except as noted in the HPI.  Objective: No changes noted in today's physical examination. There were no vitals filed for this visit. Luis Herrera is a pleasant 83 y.o. male in NAD. AAO x 3.  Vascular Examination: CFT <3 seconds b/l. DP/PT pulses faintly palpable b/l. Skin temperature gradient warm to warm b/l. No pain with calf compression. No ischemia or gangrene. No cyanosis or clubbing noted b/l. Trace edema noted BLE.   Neurological Examination: Sensation grossly intact b/l with 10 gram monofilament.  Dermatological Examination: Pedal skin warm and supple b/l.   No open wounds. No interdigital macerations.  Toenails 1-5 b/l thick, discolored, elongated with subungual debris and pain on dorsal palpation.    Well healed surgical scar(s) noted left lower extremity. Toenails 2-5 bilaterally and right great toe elongated, discolored, dystrophic, thickened, and crumbly with subungual debris and tenderness to dorsal palpation. Anonychia noted left great toe. Nailbed(s) epithelialized.   Porokeratotic lesion(s) left heel and submet head 5 left foot. No erythema, no edema, no drainage, no fluctuance.  Musculoskeletal Examination: Muscle strength 5/5 to all lower extremity muscle groups bilaterally. No pain, crepitus or joint limitation noted with ROM bilateral LE. No gross bony deformities bilaterally.  Radiographs: None  Assessment/Plan: 1. Pain due to onychomycosis of toenails of both feet   -Consent given for treatment as described below: -Examined patient. -Mycotic toenails 1-5 bilaterally were debrided in length and girth with sterile nail nippers and dremel without incident. -As a courtesy, porokeratotic lesion(s) left heel and submet head 5 left foot pared and enucleated without complication or incident. Total number pared=2. -Patient/POA to call should there be question/concern in the interim.   Return in about 9 weeks (around 09/27/2023).  [name], DPM      [place] LOCATION: [address], [place] [address]                   Office [phone]   [facility] LOCATION: [address] [place], [place] [address] Office [phone]

## 2023-09-13 DIAGNOSIS — H524 Presbyopia: Secondary | ICD-10-CM | POA: Diagnosis not present

## 2023-09-13 DIAGNOSIS — H40013 Open angle with borderline findings, low risk, bilateral: Secondary | ICD-10-CM | POA: Diagnosis not present

## 2023-09-13 DIAGNOSIS — H353131 Nonexudative age-related macular degeneration, bilateral, early dry stage: Secondary | ICD-10-CM | POA: Diagnosis not present

## 2023-09-27 ENCOUNTER — Ambulatory Visit: Admitting: Podiatry

## 2023-09-27 ENCOUNTER — Encounter: Payer: Self-pay | Admitting: Podiatry

## 2023-09-27 DIAGNOSIS — B351 Tinea unguium: Secondary | ICD-10-CM | POA: Diagnosis not present

## 2023-09-27 DIAGNOSIS — M79674 Pain in right toe(s): Secondary | ICD-10-CM | POA: Diagnosis not present

## 2023-09-27 DIAGNOSIS — M79675 Pain in left toe(s): Secondary | ICD-10-CM

## 2023-10-01 ENCOUNTER — Encounter: Payer: Self-pay | Admitting: Podiatry

## 2023-10-01 NOTE — Progress Notes (Signed)
  Subjective:  Patient ID: Luis Herrera, male    DOB: 11-10-1940,  MRN: [id number]  Luis Herrera presents to clinic today for painful thick toenails that are difficult to trim. Pain interferes with ambulation. Aggravating factors include wearing enclosed shoe gear. Pain is relieved with periodic professional debridement.  Chief Complaint  Patient presents with   rfc     Rm4 Routine foot care / Dr. Mercer    New problem(s): None.   PCP is Luis Clotilda SAUNDERS, MD.  Allergies  Allergen Reactions   Sulfa  Antibiotics Other (See Comments)    Luis Herrera Syndrome   Bactrim  [Sulfamethoxazole -Trimethoprim ] Other (See Comments)    Luis Herrera Syndrome    Review of Systems: Negative except as noted in the HPI.  Objective:  There were no vitals filed for this visit. Luis Herrera is a pleasant 83 y.o. male in NAD. AAO x 3.  Vascular Examination: CFT <3 seconds b/l. DP/PT pulses 1/4 b/l.  Digital hair absent. Skin temperature gradient warm to warm b/l. No pain with calf compression. No ischemia or gangrene. No cyanosis or clubbing noted b/l. Trace edema noted BLE.   Neurological Examination: Sensation grossly intact b/l with 10 gram monofilament.   Dermatological Examination: Pedal skin warm and supple b/l. No open wounds b/l. No interdigital macerations. Anonychia left great toe.Toenails right great toe and 2-5 b/l thick, discolored, elongated with subungual debris and pain on dorsal palpation.  No hyperkeratotic nor porokeratotic lesions.  No corns, calluses, nor porokeratotic lesions.  Musculoskeletal Examination: Muscle strength 5/5 to all lower extremity muscle groups bilaterally. No pain, crepitus or joint limitation noted with ROM bilateral LE. No gross bony deformities bilaterally.  Radiographs: None  Assessment/Plan: 1. Pain due to onychomycosis of toenails of both feet   -Mycotic toenails 2-5 bilaterally and R hallux were debrided in length and girth with sterile nail  nippers and dremel without iatrogenic bleeding. -Patient/POA to call should there be question/concern in the interim.   Return in about 9 weeks (around 11/29/2023).  Luis Herrera, DPM      [place] LOCATION: [address], [place] [address]                   Office [phone]   [facility] LOCATION: [address] [place], [place] [address] Office [phone]

## 2023-10-14 ENCOUNTER — Ambulatory Visit (HOSPITAL_COMMUNITY)
Admission: RE | Admit: 2023-10-14 | Discharge: 2023-10-14 | Disposition: A | Discharge status: Home / Self Care | Source: Ambulatory Visit | Attending: Cardiology | Admitting: Cardiology

## 2023-10-14 ENCOUNTER — Other Ambulatory Visit (HOSPITAL_COMMUNITY)

## 2023-10-14 DIAGNOSIS — I10 Essential (primary) hypertension: Secondary | ICD-10-CM

## 2023-10-14 DIAGNOSIS — Z952 Presence of prosthetic heart valve: Secondary | ICD-10-CM | POA: Diagnosis not present

## 2023-10-15 ENCOUNTER — Encounter: Payer: Self-pay | Admitting: Internal Medicine

## 2023-10-15 LAB — ECHOCARDIOGRAM COMPLETE
AR max vel: 0.66 cm2
AV Area VTI: 0.71 cm2
AV Area mean vel: 0.61 cm2
AV Mean grad: 22 mmHg
AV Peak grad: 36.5 mmHg
Ao pk vel: 3.02 m/s
Area-P 1/2: 1.61 cm2
MV M vel: 4.79 m/s
MV Peak grad: 91.8 mmHg
P 1/2 time: 485 ms
S' Lateral: 3.2 cm

## 2023-10-18 ENCOUNTER — Ambulatory Visit: Payer: Self-pay | Admitting: Internal Medicine

## 2023-10-18 NOTE — Progress Notes (Unsigned)
 Cardiology Office Note   Date: 10/23/2023  ID:  [name], DOB Jan 17, 1941, MRN [id number]  PCP:  Patient, No Pcp Per  Cardiologist:   [name], MD   Pt presents for follow up of CAD and HTN    History of Present Illness: Luis Herrera is a 83 y.o. male with a history of CAD, AV dz, HTN, HFpEF and PVCs (followed by [name]; on amiodarone )  AV disease  Pt s/p AVR(bioprosthesis) in 2009 with CABG at that time (SVG to PDA) ([place]) 2023 PT developed worsening  SOB, CHF and severe AS       2023 LHC showed patient PDA graft , minimal dz of L coronary artery Pt underwent TAVR in Aug 2023 (23 mm Sapien 3 prosthesis)    Post procedure echo LVEF 45 to 50%  Mild RV dysfunction   Mean gradient across VIV TAVR was 19 mm with mild PVL   Mod MAD noted with mild MR/MS   I saw the pt on 10/01/22  His BP was very  low at the time and he was very symptomatic  His meds were decreased ( Stopped Entresto ; Told to stop amlodipine  but ? If pt continued)   With hypotension a repeat echo was ordered  Aug 2024  Echo showed LVEF 50%   TAVR valve mean gradient 20mm HG, down from previous.  Mild perivalvular AI.  UA showed UTI and he was treated    A CT was also ordered to r/o HALT  This showed good leaflet excursion. The pt was seen by MARLA Hummer on 10/16/22   He was still hypotensive.  Actually still taking Entresto .   This was then stopped and his BP improved    Over time, BP improved  he resumed both carvedilol  and Entresto    Cause for hypotension not found  I saw the pt in April 2025 Aug 2025  Echo  LVEF normal  Mean gradient was 22 mm (compared to 20 mm on echo from Aug 2024)  MIld PVL  Note:   I did not think calculated area or DVI were accurate     MV opens adequately  Since seen the pt says his breathing is good   He denies CP   Still has some ankle edema  Wears support socks     Denies palpitations      Current Meds  Medication Sig   amiodarone  (PACERONE ) 200 MG tablet TAKE 1/2 TABLET BY MOUTH DAILY    amLODipine  (NORVASC ) 5 MG tablet Take 1 tablet (5 mg total) by mouth daily.   aspirin  EC 81 MG tablet Take 1 tablet (81 mg total) by mouth daily. Swallow whole.   atorvastatin  (LIPITOR ) 80 MG tablet TAKE 1 TABLET BY MOUTH EVERY DAY   Calcium  Carb-Cholecalciferol (CALCIUM  PLUS VITAMIN D3 PO) Take 2 tablets by mouth daily. Gummy supplements   carvedilol  (COREG ) 3.125 MG tablet Take 1 tablet (3.125 mg total) by mouth 2 (two) times daily.   cephALEXin  (KEFLEX ) 250 MG capsule Take 250 mg by mouth at bedtime.   ezetimibe  (ZETIA ) 10 MG tablet TAKE 1 TABLET BY MOUTH EVERY DAY   furosemide  (LASIX ) 40 MG tablet TAKE ONE TABLET (40 MG) EVERY OTHER DAY ALTERNATING WITH 1 1/2 TABLETS (60 MG) ON THE OPPOSITE DAYS. (Patient taking differently: Take 40 mg by mouth daily. Take one tablet (40 mg) every other day alternating with 1 1/2 tablets (60 mg) on the opposite days.)   levothyroxine  (SYNTHROID ) 50 MCG tablet Take 1 tablet (  50 mcg total) by mouth daily before breakfast.   Multiple Vitamins-Minerals (PRESERVISION AREDS 2) CAPS Take 1 capsule by mouth 2 (two) times daily.   sacubitril -valsartan  (ENTRESTO ) 24-26 MG Take 1 tablet by mouth 2 (two) times daily.   tamsulosin  (FLOMAX ) 0.4 MG CAPS capsule Take 0.4 mg by mouth in the morning and at bedtime.     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 Wendel and Dr. Lucas   Thyroid  disease     Past Surgical History:  Procedure Laterality Date    AORTIC VALVE REPLACEMENT  2009   [facility]oprosthetic at [facility]   CARDIAC CATHETERIZATION  2008   CHOLECYSTECTOMY  1982   COLONOSCOPY     CORONARY ARTERY BYPASS GRAFT  2009   SVG-PDA w/ AVR   CORRECTION HAMMER TOE  2011   BILATERAL   GASTROCNEMIUS RECESSION  05/28/2011   INTRAOPERATIVE TRANSTHORACIC ECHOCARDIOGRAM N/A 09/30/2021   Procedure: INTRAOPERATIVE TRANSTHORACIC ECHOCARDIOGRAM;  Surgeon: Wendel Lurena POUR, MD;  Location: [facility];  Service: Open Heart Surgery;  Laterality: N/A;   POLYPECTOMY     RIGHT/LEFT HEART CATH AND CORONARY/GRAFT ANGIOGRAPHY N/A 09/24/2021   Procedure: RIGHT/LEFT HEART CATH AND CORONARY/GRAFT ANGIOGRAPHY;  Surgeon: Wendel Lurena POUR, MD;  Location: [facility] INVAS[facility];  Service: Cardiovascular;  Laterality: N/A;   TONSILLECTOMY     TOTAL HIP ARTHROPLASTY Left 2000   TOTAL KNEE ARTHROPLASTY  2000   left   TOTAL KNEE ARTHROPLASTY  12/01/2011   Procedure: TOTAL KNEE ARTHROPLASTY;  Surgeon: Donnice JONETTA Car, MD;  Location: [facility] OR[facility];  Service: Orthopedics;  Laterality: Right;   TRANSCATHETER AORTIC VALVE REPLACEMENT, TRANSFEMORAL Right 09/30/2021   Procedure: Transcatheter Aortic Valve Replacement, Transfemoral;  Surgeon: Wendel Lurena POUR, MD;  Location: [facility];  Service: Open Heart Surgery;  Laterality: Right;   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   Procedure: TRANSURETHRAL RESECTION OF BLADDER TUMOR (TURBT) BILATERAL RETROGRADE PYELOGRAM;  Surgeon: Carolee Sherwood JONETTA DOUGLAS, MD;  Location: [facility];  Service: Urology;  Laterality: Bilateral;  45 MINS FOR CASE   WEIL OSTEOTOMY  05/28/2011     Social History:  The patient  reports that he quit smoking about 50 years ago. His smoking use included cigarettes. He has never used smokeless tobacco. He reports that he does not currently use alcohol. He reports that he does not use drugs.   Family History:  The patient's family history includes Colon cancer (age of  onset: 83) in his mother; Heart attack in his brother and father; Hyperlipidemia in an other family member; Hypertension in his brother, sister, and another family member.    ROS:  Please see the history of present illness. All other systems are reviewed and  Negative to the above problem except as noted.    PHYSICAL EXAM: VS:  BP 120/60   Pulse 60   Ht  5' 10 (1.778 m)   Wt 203 lb 8 oz (92.3 kg)   SpO2 97%   BMI 29.20 kg/m    GEN: Pt is in NAD  Neck: JVP is normal Cardiac: RRR; Gr I/VI systolic murmur base   Respiratory:  clear to auscultation  GI: soft, nontender, No hepatomegaly  Ext   Tr edema with socks on   EKG:  EKG shows NSR 60 bpm   RBBB LAD   LVH   IWMI   Echo  Aug 2025  1. Left ventricular ejection fraction, by estimation, is 55 to 60%. The  left ventricle has normal function. The left ventricle has no regional  wall motion abnormalities. Left ventricular diastolic parameters are  consistent with Grade I diastolic  dysfunction (impaired relaxation). Elevated left atrial pressure.   2. Right ventricular systolic function is normal. The right ventricular  size is mildly enlarged. There is normal pulmonary artery systolic  pressure. The estimated right ventricular systolic pressure is 25.3 mmHg.   3. Left atrial size was severely dilated.   4. The mitral valve is normal in structure. Mild mitral valve  regurgitation. No evidence of mitral stenosis. Moderate mitral annular  calcification.   5. The aortic valve has been repaired/replaced. Mild perivalvular Aortic  valve regurgitation. No aortic stenosis is present. S/P valve-in-valve  TAVR (prior SAVR). There is a 23 mm Sapien prosthetic (TAVR) valve present  in the aortic position. Procedure  Date: 09/30/2021. Aortic regurgitation PHT measures 485 msec. Aortic valve  area, by VTI measures 0.71 cm. Aortic valve mean gradient measures 22.0  mmHg. Aortic valve Vmax measures 3.02 m/s.   6. The inferior vena cava is  normal in size with greater than 50%  respiratory variability, suggesting right atrial pressure of 3 mmHg.   7. Compared to study dated 10/02/2023, mean AVG and Vmax are stable, AVA  has decreased from 1.12cm2 to 0.71cm2 and DI has decreased from 0.33 to  0.22.   Echo 10/02/22   1. Left ventricular ejection fraction, by estimation, is 50%. The left  ventricle has mildly decreased function. The left ventricle demonstrates  global hypokinesis. There is mild concentric left ventricular hypertrophy.  Left ventricular diastolic  parameters are consistent with Grade I diastolic dysfunction (impaired  relaxation).   2. Right ventricular systolic function is mildly reduced. The right  ventricular size is normal. Tricuspid regurgitation signal is inadequate  for assessing PA pressure.   3. Left atrial size was mildly dilated.   4. The mitral valve is normal in structure. Trivial mitral valve  regurgitation. No evidence of mitral stenosis. Moderate mitral annular  calcification.   5. S/p valve-in-valve TAVR (previous SAVR). Mild peri-valvular leakage.  Mean gradient 20 mmHg. This is elevated but lower than previous echo. EOA  1.19 cm^2 with dimensionless index 0.3.   6. IVC not visualized.   CT   Sept 2024   1. No evidence of HALT or HAM on TAVR prosthesis to explain increase in gradients. Normal leaflet thickness. No thrombus or pannus.   2. No evidence of valve dehiscence. TAVR prosthesis is well seated and well apposed to the prior surgical aortic valve prosthesis. No CT findings to suggest source of paravalvular regurgitation.    Echo  Sept   2023   1. Left ventricular ejection fraction, by estimation, is 50 to 55%. The  left ventricle has low normal function. The left ventricle demonstrates  global hypokinesis. There is moderate left ventricular hypertrophy. Left  ventricular diastolic parameters are  consistent with Grade II diastolic dysfunction (pseudonormalization).  Elevated  left atrial pressure.   2. Right ventricular systolic function is normal. The right ventricular  size is mildly enlarged.   3. Left atrial size was moderately dilated.   4. The mitral valve is degenerative. Mild to moderate mitral valve  regurgitation.   5. Tricuspid valve regurgitation is mild to moderate.   6. The major aortic regurgitant jet appears to be perivalvular at 2-3  o'clock, but there may be an intravalvular component. Aortic valve  gradients are higher, probably in part due to AI-related increased stroke  volume. However, the dimensionless index  has decreased, which may signify a true reduction in the effective valve  area. The aortic valve has been repaired/replaced. Aortic valve  regurgitation is moderate. There is a Edwards valve in valve present in  the aortic position. Procedure Date:  09/30/2021. Aortic regurgitation PHT measures 354 msec. Aortic valve mean  gradient measures 28.0 mmHg. Aortic valve Vmax measures 3.64 m/s. Aortic  valve acceleration time measures 100 msec.   Comparison(s): Aortic insufficiency has worsened and aortic valve  gradients are higher. The aortic ejection jet remains relatively early  peaking (acceleration time 100 ms). Suspect the pulsed Doppler sample  volume is placed too deep in the ventricle,  which leads to underestimation of the dimensionless index and the aortic  valve area.    [place]    01/09/20  Study Highlights  1. NSR with sinus brady and sinus tachycardia 2. Frequent PVC's and PAC's 3. NS AT 4. Rare triplets 5. Bigeminal and trigeminal PVC's  Lipid Panel    Component Value Date/Time   CHOL 158 05/19/2022 1248   TRIG 91 05/19/2022 1248   HDL 52 05/19/2022 1248   CHOLHDL 3.0 05/19/2022 1248   CHOLHDL 3 11/14/2015 0954   VLDL 16.8 11/14/2015 0954   LDLCALC 89 05/19/2022 1248      Wt Readings from Last 3 Encounters:  10/22/23 203 lb 8 oz (92.3 kg)  06/07/23 207 lb 3.2 oz (94 kg)  05/19/23 206 lb (93.4 kg)       ASSESSMENT AND PLAN:   1 AV disease   Pt s/p SAVR in 2009 then TAVR (23 mm Sapien 3) in 2023   Gradient after procedure was 19 mm Hg   Recent echo showed mean gradient of 22 mm Hg with mild PVL     Follow   2  Hx CAD  Pt s/p CABG x 1 in 2009 at time of SAVR ([place])   Last LHC in 2023 before TAVR showed patent graft  No significant dz elsewhere   3  Hx HFmrEF and now  HFpEF    LVEF has normalized form a couple years ago Pt with very mild LE edema    Overall doing well Keep on Entresto  and carvedilol    4  HTN  BP is well controlled on amlodipoine and Entrestor and Coreg    5  HL   Last lipis in 2024 LDL was 66  Will repeat   6  Hx PVCs   Monitor in 2021 showed frequent PVCs   Pt followed in EP   On amiodarone   Will check labs   7  CV dz   Mild   Check:  NMR panel, CMET, CBC, TSH, BNP    Follow up in the spring   Current medicines are reviewed at length with the patient today.  The patient does not have concerns regarding medicines.  Signed, [name], MD  [facility]

## 2023-10-22 ENCOUNTER — Other Ambulatory Visit: Payer: Self-pay

## 2023-10-22 ENCOUNTER — Encounter: Payer: Self-pay | Admitting: Internal Medicine

## 2023-10-22 ENCOUNTER — Ambulatory Visit: Attending: Internal Medicine | Admitting: Internal Medicine

## 2023-10-22 VITALS — BP 120/60 | HR 60 | Ht 70.0 in | Wt 203.5 lb

## 2023-10-22 DIAGNOSIS — Z951 Presence of aortocoronary bypass graft: Secondary | ICD-10-CM | POA: Diagnosis not present

## 2023-10-22 DIAGNOSIS — I2510 Atherosclerotic heart disease of native coronary artery without angina pectoris: Secondary | ICD-10-CM

## 2023-10-22 DIAGNOSIS — R600 Localized edema: Secondary | ICD-10-CM | POA: Diagnosis not present

## 2023-10-22 DIAGNOSIS — E785 Hyperlipidemia, unspecified: Secondary | ICD-10-CM

## 2023-10-22 DIAGNOSIS — I493 Ventricular premature depolarization: Secondary | ICD-10-CM

## 2023-10-22 DIAGNOSIS — I10 Essential (primary) hypertension: Secondary | ICD-10-CM | POA: Diagnosis not present

## 2023-10-22 DIAGNOSIS — Z952 Presence of prosthetic heart valve: Secondary | ICD-10-CM | POA: Diagnosis not present

## 2023-10-22 DIAGNOSIS — Z79899 Other long term (current) drug therapy: Secondary | ICD-10-CM

## 2023-10-22 NOTE — Patient Instructions (Signed)
 Medication Instructions:  Your physician recommends that you continue on your current medications as directed. Please refer to the Current Medication list given to you today.  *If you need a refill on your cardiac medications before your next appointment, please call your pharmacy*  Lab Work: TODAY: CBC, CMET, TSH, BNP, Hgb A1c, NMR If you have labs (blood work) drawn today and your tests are completely normal, you will receive your results only by: MyChart Message (if you have MyChart) OR A paper copy in the mail If you have any lab test that is abnormal or we need to change your treatment, we will call you to review the results.  Follow-Up: At [facility], you and your health needs are our priority.  As part of our continuing mission to provide you with exceptional heart care, our providers are all part of one team.  This team includes your primary Cardiologist (physician) and Advanced Practice Providers or APPs (Physician Assistants and Nurse Practitioners) who all work together to provide you with the care you need, when you need it.  Your next appointment:   6 month(s)  Provider:   [name], MD  We recommend signing up for the patient portal called MyChart.  Sign up information is provided on this After Visit Summary.  MyChart is used to connect with patients for Virtual Visits (Telemedicine).  Patients are able to view lab/test results, encounter notes, upcoming appointments, etc.  Non-urgent messages can be sent to your provider as well.    To learn more about what you can do with MyChart, go to [email or web address].

## 2023-10-23 ENCOUNTER — Ambulatory Visit: Payer: Self-pay | Admitting: Internal Medicine

## 2023-10-23 LAB — COMPREHENSIVE METABOLIC PANEL WITH GFR
ALT: 19 IU/L (ref 0–44)
AST: 38 IU/L (ref 0–40)
Albumin: 4.1 g/dL (ref 3.7–4.7)
Alkaline Phosphatase: 102 IU/L (ref 44–121)
BUN/Creatinine Ratio: 35 — ABNORMAL HIGH (ref 10–24)
BUN: 35 mg/dL — ABNORMAL HIGH (ref 8–27)
Bilirubin Total: 1.3 mg/dL — ABNORMAL HIGH (ref 0.0–1.2)
CO2: 20 mmol/L (ref 20–29)
Calcium: 9.1 mg/dL (ref 8.6–10.2)
Chloride: 106 mmol/L (ref 96–106)
Creatinine, Ser: 0.99 mg/dL (ref 0.76–1.27)
Globulin, Total: 2.7 g/dL (ref 1.5–4.5)
Glucose: 95 mg/dL (ref 70–99)
Potassium: 4.5 mmol/L (ref 3.5–5.2)
Sodium: 142 mmol/L (ref 134–144)
Total Protein: 6.8 g/dL (ref 6.0–8.5)
eGFR: 76 mL/min/1.73 (ref >59)

## 2023-10-23 LAB — NMR, LIPOPROFILE
Cholesterol, Total: 140 mg/dL (ref 100–199)
HDL Particle Number: 32.2 umol/L (ref >=30.5)
HDL-C: 55 mg/dL (ref >39)
LDL Particle Number: 1025 nmol/L — ABNORMAL HIGH (ref <1000)
LDL Size: 21 nm (ref >20.5)
LDL-C (NIH Calc): 68 mg/dL (ref 0–99)
LP-IR Score: 26 (ref <=45)
Small LDL Particle Number: 453 nmol/L (ref <=527)
Triglycerides: 87 mg/dL (ref 0–149)

## 2023-10-23 LAB — TSH: TSH: 3.77 u[IU]/mL (ref 0.450–4.500)

## 2023-10-23 LAB — CBC
Hematocrit: 33.1 % — ABNORMAL LOW (ref 37.5–51.0)
Hemoglobin: 10.6 g/dL — ABNORMAL LOW (ref 13.0–17.7)
MCH: 32.2 pg (ref 26.6–33.0)
MCHC: 32 g/dL (ref 31.5–35.7)
MCV: 101 fL — ABNORMAL HIGH (ref 79–97)
Platelets: 269 x10E3/uL (ref 150–450)
RBC: 3.29 x10E6/uL — ABNORMAL LOW (ref 4.14–5.80)
RDW: 13.1 % (ref 11.6–15.4)
WBC: 9.5 x10E3/uL (ref 3.4–10.8)

## 2023-10-23 LAB — HEMOGLOBIN A1C
Est. average glucose Bld gHb Est-mCnc: 91 mg/dL
Hgb A1c MFr Bld: 4.8 % (ref 4.8–5.6)

## 2023-10-23 LAB — PRO B NATRIURETIC PEPTIDE: NT-Pro BNP: 221 pg/mL (ref 0–486)

## 2023-10-27 ENCOUNTER — Other Ambulatory Visit: Payer: Self-pay | Admitting: Internal Medicine

## 2023-11-03 ENCOUNTER — Ambulatory Visit: Admitting: Podiatry

## 2023-11-12 DIAGNOSIS — L820 Inflamed seborrheic keratosis: Secondary | ICD-10-CM | POA: Diagnosis not present

## 2023-11-12 DIAGNOSIS — L814 Other melanin hyperpigmentation: Secondary | ICD-10-CM | POA: Diagnosis not present

## 2023-11-12 DIAGNOSIS — D1801 Hemangioma of skin and subcutaneous tissue: Secondary | ICD-10-CM | POA: Diagnosis not present

## 2023-11-12 DIAGNOSIS — Z85828 Personal history of other malignant neoplasm of skin: Secondary | ICD-10-CM | POA: Diagnosis not present

## 2023-11-12 DIAGNOSIS — D1722 Benign lipomatous neoplasm of skin and subcutaneous tissue of left arm: Secondary | ICD-10-CM | POA: Diagnosis not present

## 2023-11-12 DIAGNOSIS — L570 Actinic keratosis: Secondary | ICD-10-CM | POA: Diagnosis not present

## 2023-11-12 DIAGNOSIS — L821 Other seborrheic keratosis: Secondary | ICD-10-CM | POA: Diagnosis not present

## 2023-11-17 ENCOUNTER — Ambulatory Visit: Admitting: Family Medicine

## 2023-11-17 ENCOUNTER — Ambulatory Visit: Payer: Self-pay

## 2023-11-17 ENCOUNTER — Encounter: Payer: Self-pay | Admitting: Family Medicine

## 2023-11-17 VITALS — BP 124/62 | HR 64 | Temp 97.9°F | Resp 16 | Ht 70.0 in | Wt 200.0 lb

## 2023-11-17 DIAGNOSIS — R070 Pain in throat: Secondary | ICD-10-CM | POA: Diagnosis not present

## 2023-11-17 DIAGNOSIS — Z23 Encounter for immunization: Secondary | ICD-10-CM | POA: Diagnosis not present

## 2023-11-17 NOTE — Patient Instructions (Addendum)
 A few things to remember from today's visit:  Not sure about what is exactly causing the problem, could be irritation. Try eating peanut butter without the crunchy. If you have new symptoms associated we can certainly arrange an appointment with allergy doctor.  Do not use My Chart to request refills or for acute issues that need immediate attention. If you send a my chart message, it may take a few days to be addressed, specially if I am not in the office.  Please be sure medication list is accurate. If a new problem present, please set up appointment sooner than planned today.

## 2023-11-17 NOTE — Telephone Encounter (Signed)
 FYI Only or Action Required?: FYI only for provider.  Patient was last seen in primary care on 02/19/2022 by Luke Chiquita SAUNDERS, DO.  Called Nurse Triage reporting Sore Throat.  Symptoms began yesterday.  Interventions attempted: Rest, hydration, or home remedies.  Symptoms are: unchanged.  Triage Disposition: See Physician Within 24 Hours  Patient/caregiver understands and will follow disposition?: Yes   Copied from CRM #[id number]. Topic: Clinical - Red Word Triage >> Nov 17, 2023 10:56 AM Mia F wrote: Red Word that prompted transfer to Nurse Triage: Sharp pain to the throat after eating peanuts. Started last night after eating peanuts. Feels he constantly has to clear his throat. Never had this happen before but had an experience with peanuts. Reason for Disposition  [1] Sore throat is the only symptom AND [2] sore throat present < 48 hours  [1] Swallowed a pill or hard candy AND [2] mild symptoms of pill stuck in throat or esophagus (e.g., mild pain in throat or chest, FB sensation) AND [3] no relief after using Care Advice  Answer Assessment - Initial Assessment Questions Additional info:  Has had this sharp pain in throat previously and needing to clear throat after eating nuts. Never talked with a doctor about this. He is requesting allergy testing.    1. ONSET: When did the throat start hurting? (Hours or days ago)      yesterday 2. SEVERITY: How bad is the sore throat? (Scale 1-10; mild, moderate or severe)     sharp 3. STREP EXPOSURE: Has there been any exposure to strep within the past week? If Yes, ask: What type of contact occurred?      no 4.  VIRAL SYMPTOMS: Are there any symptoms of a cold, such as a runny nose, cough, hoarse voice or red eyes?      no 5. FEVER: Do you have a fever? If Yes, ask: What is your temperature, how was it measured, and when did it start?     no 6. PUS ON THE TONSILS: Is there pus on the tonsils in the back of your throat?      no 7. OTHER SYMPTOMS: Do you have any other symptoms? (e.g., difficulty breathing, headache, rash)     denies  Answer Assessment - Initial Assessment Questions 1. OBJECT: What is it?      nuts 2. SIZE: How large is it? (e.g., inches or cm; or compare it to coins)      unsure 3. ONSET: How long ago did you swallow it? (e.g., minutes, hours)      yesterday 4. HOW DID IT HAPPEN: Tell me how it happened.      eating 5. OTHER SYMPTOMS: Are there any other symptoms? (e.g., pain in neck or chest, difficulty breathing, difficulty swallowing)     Frequent need to clear throat, doesn't feel anything is stuck but needing to clear throat. Denies swelling, difficulty breathing and swallowing.  6. PREGNANCY: Is there any chance you are pregnant? When was your last menstrual period?  Protocols used: Sore Throat-A-AH, Swallowed Foreign Body-A-AH

## 2023-11-17 NOTE — Progress Notes (Unsigned)
 ACUTE VISIT Chief Complaint  Patient presents with   Sore Throat   Discussed the use of AI scribe software for clinical note transcription with the patient, who gave verbal consent to proceed. History of Present Illness Luis Herrera is an 83 year old male with past medical history significant for diastolic dysfunction, CAD, hypertension, RBBB, and hyperlipidemia who presents with a sore throat and intermittent throat discomfort. He is with his wife today.  He has been experiencing a sore throat and intermittent throat discomfort that began last night after consuming keto ice cream with crunchy bits. The sensation is described as more of a pain than an allergic reaction, with a raspy feeling in his throat. The discomfort is intermittent, lasting about an hour initially, and then recurring throughout the day. He has been clearing his throat frequently since yesterday.  This is the first time he has experienced such a reaction, although for a while he occasionally feels a similar sensation when eating protein bars with nuts.   No oral/facial edema, wheezing, difficulty breathing, or problems swallowing solids like rice, meat,or bread. He also reports no acid reflux, heartburn, fever, chills, decreased appetite, or body aches.  His wife mentions that he has a family history of allergies, as his grandson experienced a severe allergic reaction at school when eating peanuts, and his son is allergic to certain fruits and seeds. However, he has not had any known allergies or similar reactions in the past.  He states that has been consuming protein drinks without any issues.  Review of Systems  Constitutional:  Negative for activity change and appetite change.  HENT:  Negative for mouth sores and postnasal drip.   Respiratory:  Negative for cough, choking, chest tightness and stridor.   Cardiovascular:  Negative for chest pain.  Gastrointestinal:  Negative for abdominal pain, nausea and vomiting.   Endocrine: Negative for cold intolerance and heat intolerance.  Skin:  Negative for rash.  Allergic/Immunologic: Negative for environmental allergies.  Neurological:  Negative for syncope, weakness and headaches.  Psychiatric/Behavioral:  Negative for confusion.   See other pertinent positives and negatives in HPI.  Current Outpatient Medications on File Prior to Visit  Medication Sig Dispense Refill   amiodarone  (PACERONE ) 200 MG tablet TAKE 1/2 TABLET BY MOUTH DAILY 50 tablet 3   amLODipine  (NORVASC ) 5 MG tablet Take 1 tablet (5 mg total) by mouth daily. 90 tablet 2   aspirin  EC 81 MG tablet Take 1 tablet (81 mg total) by mouth daily. Swallow whole. 90 tablet 3   atorvastatin  (LIPITOR ) 80 MG tablet TAKE 1 TABLET BY MOUTH EVERY DAY 90 tablet 1   Calcium  Carb-Cholecalciferol (CALCIUM  PLUS VITAMIN D3 PO) Take 2 tablets by mouth daily. Gummy supplements     carvedilol  (COREG ) 3.125 MG tablet TAKE 1 TABLET BY MOUTH 2 TIMES DAILY. 180 tablet 3   cephALEXin  (KEFLEX ) 250 MG capsule Take 250 mg by mouth at bedtime.     ezetimibe  (ZETIA ) 10 MG tablet TAKE 1 TABLET BY MOUTH EVERY DAY 90 tablet 3   furosemide  (LASIX ) 40 MG tablet TAKE ONE TABLET (40 MG) EVERY OTHER DAY ALTERNATING WITH 1 1/2 TABLETS (60 MG) ON THE OPPOSITE DAYS. (Patient taking differently: Take 40 mg by mouth daily. Take one tablet (40 mg) every other day alternating with 1 1/2 tablets (60 mg) on the opposite days.) 360 tablet 1   levothyroxine  (SYNTHROID ) 50 MCG tablet Take 1 tablet (50 mcg total) by mouth daily before breakfast. 90 tablet 3   Multiple  Vitamins-Minerals (PRESERVISION AREDS 2) CAPS Take 1 capsule by mouth 2 (two) times daily.     sacubitril -valsartan  (ENTRESTO ) 24-26 MG Take 1 tablet by mouth 2 (two) times daily. 180 tablet 3   tamsulosin  (FLOMAX ) 0.4 MG CAPS capsule Take 0.4 mg by mouth in the morning and at bedtime.     No current facility-administered medications on file prior to visit.   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 Wendel and Dr. Lucas   Thyroid  disease    Allergies  Allergen Reactions   Sulfa  Antibiotics Other (See Comments)    Garnette Louder Syndrome   Bactrim  [Sulfamethoxazole -Trimethoprim ] Other (See Comments)    Garnette Louder Syndrome    Social History   Socioeconomic History   Marital status: Married    Spouse name: Not on file   Number of children: 2   Years of education: 12   Highest education level: High school graduate  Occupational History   Occupation: retired  Tobacco Use   Smoking status: Former    Current packs/day: 0.00    Types: Cigarettes    Quit date: 05/25/1973    Years since quitting: 50.5   Smokeless tobacco: Never  Vaping Use   Vaping status: Never Used  Substance and Sexual Activity   Alcohol use: Not Currently    Comment: rare   Drug use: No   Sexual activity: Not Currently  Other Topics Concern   Not on file  Social History Narrative   Regular exercise-yes. Pt lives in [facility] condo with his wife.   Social Drivers of Corporate investment banker Strain: Low Risk  (01/21/2022)   Overall Financial Resource Strain (CARDIA)    Difficulty of Paying Living Expenses: Not hard at all  Food Insecurity: No Food Insecurity (01/21/2022)   Hunger Vital Sign    Worried About Running Out of Food in the Last Year: Never true    Ran Out of Food in the Last Year: Never true  Transportation Needs: No Transportation Needs (01/21/2022)   PRAPARE - Administrator, Civil Service (Medical): No     Lack of Transportation (Non-Medical): No  Physical Activity: Sufficiently Active (04/08/2022)   Exercise Vital Sign    Days of Exercise per Week: 4 days    Minutes of Exercise per Session: 50 min  Stress: Not on file  Social Connections: Not on file   Vitals:   11/17/23 1333  BP: 124/62  Pulse: 64  Resp: 16  Temp: 97.9 F (36.6 C)  SpO2: 96%   Body mass index is 28.7 kg/m.  Physical Exam Vitals and nursing note reviewed.  Constitutional:      General: He is not in acute distress.    Appearance: He is well-developed. He is not ill-appearing.  HENT:     Head: Atraumatic.     Mouth/Throat:     Mouth: Mucous membranes are moist. No oral lesions or angioedema.  Tongue: No lesions.     Pharynx: Oropharynx is clear. Uvula midline. No pharyngeal swelling, oropharyngeal exudate or posterior oropharyngeal erythema.  Eyes:     Conjunctiva/sclera: Conjunctivae normal.  Cardiovascular:     Rate and Rhythm: Normal rate and regular rhythm.     Heart sounds: No murmur heard. Pulmonary:     Effort: Pulmonary effort is normal. No respiratory distress.     Breath sounds: Normal breath sounds. No stridor.  Lymphadenopathy:     Head:     Right side of head: No submandibular adenopathy.     Left side of head: No submandibular adenopathy.     Cervical: No cervical adenopathy.  Skin:    General: Skin is warm.     Findings: No erythema or rash.  Neurological:     General: No focal deficit present.     Mental Status: He is alert and oriented to person, place, and time.     Gait: Gait normal.  Psychiatric:        Mood and Affect: Mood and affect normal.   ASSESSMENT AND PLAN:  Mr. Messmer was seen today for throat discomfort.  Throat discomfort I am not sure exactly what could be causing the problem, it sounds more like irritation. There is no signs of angioedema, dysphagia, or symptoms that suggest systemic allergic reaction. We discussed some options, including immunology  referral for possible allergy testing. No history of peanut allergies, he usually eats peanut butter with crunch, so recommend doing so without it.  If he has same problems during so, he was instructed to let me know, so we can arrange appointment with immunologist. He was clearly instructed about warning signs.  Need for influenza vaccination -     Flu vaccine HIGH DOSE PF(Fluzone Trivalent)  I personally spent a total of 23 minutes in the care of the patient today including preparing to see the patient, getting/reviewing separately obtained history, performing a medically appropriate exam/evaluation, counseling and educating, and documenting clinical information in the EHR.  Return if symptoms worsen or fail to improve, for keep next appointment.  Jacqualyn Sedgwick G. [place], MD  [facility]. [place] office.

## 2023-12-02 ENCOUNTER — Encounter: Payer: Self-pay | Admitting: Podiatry

## 2023-12-02 ENCOUNTER — Ambulatory Visit: Admitting: Podiatry

## 2023-12-02 DIAGNOSIS — B351 Tinea unguium: Secondary | ICD-10-CM

## 2023-12-02 DIAGNOSIS — M79675 Pain in left toe(s): Secondary | ICD-10-CM | POA: Diagnosis not present

## 2023-12-02 DIAGNOSIS — M79674 Pain in right toe(s): Secondary | ICD-10-CM

## 2023-12-03 DIAGNOSIS — N3020 Other chronic cystitis without hematuria: Secondary | ICD-10-CM | POA: Diagnosis not present

## 2023-12-04 ENCOUNTER — Other Ambulatory Visit: Payer: Self-pay | Admitting: Internal Medicine

## 2023-12-06 NOTE — Progress Notes (Signed)
  Subjective:  Patient ID: Luis Herrera, male    DOB: February 04, 1941,  MRN: [id number]  [name] presents to clinic today for painful thick toenails that are difficult to trim. Pain interferes with ambulation. Aggravating factors include wearing enclosed shoe gear. Pain is relieved with periodic professional debridement.  Chief Complaint  Patient presents with   Toe Pain    RFC. Denies being diabetic. Dr. Mercer is his PCP.    New problem(s): None.   PCP is Mercer Clotilda SAUNDERS, MD.  Allergies  Allergen Reactions   Sulfa  Antibiotics Other (See Comments)    Garnette Louder Syndrome   Bactrim  [Sulfamethoxazole -Trimethoprim ] Other (See Comments)    Garnette Louder Syndrome    Review of Systems: Negative except as noted in the HPI.  Objective: No changes noted in today's physical examination. There were no vitals filed for this visit. [name] is a pleasant 83 y.o. male in NAD. AAO x 3.  Vascular Examination: CFT <3 seconds b/l. DP/PT pulses 1/4 b/l.  Digital hair absent. Skin temperature gradient warm to warm b/l. No pain with calf compression. No ischemia or gangrene. No cyanosis or clubbing noted b/l. Trace edema noted BLE.   Neurological Examination: Sensation grossly intact b/l with 10 gram monofilament.   Dermatological Examination: Pedal skin warm and supple b/l. No open wounds b/l. No interdigital macerations. Toenails right great toe and 1-5 b/l thick, discolored, elongated with subungual debris and pain on dorsal palpation.  No hyperkeratotic nor porokeratotic lesions.  No corns, calluses, nor porokeratotic lesions.  Musculoskeletal Examination: Muscle strength 5/5 to all lower extremity muscle groups bilaterally. No pain, crepitus or joint limitation noted with ROM bilateral LE. No gross bony deformities bilaterally.  Radiographs: None  Assessment/Plan: 1. Pain due to onychomycosis of toenails of both feet    Patient was evaluated and treated. All patient's and/or  POA's questions/concerns addressed on today's visit. Toenails 1-5 b/l debrided in length and girth without incident. Continue soft, supportive shoe gear daily. Report any pedal injuries to medical professional. Call office if there are any questions/concerns. -Patient/POA to call should there be question/concern in the interim.   Return in about 9 weeks (around 02/03/2024).  Delon LITTIE Merlin, DPM      [place] LOCATION: [address] [place] [address]                   Office [phone]   [facility] LOCATION: [address] [place], [place] [address] Office [phone]

## 2023-12-10 ENCOUNTER — Other Ambulatory Visit: Payer: Self-pay | Admitting: Internal Medicine

## 2024-01-12 ENCOUNTER — Telehealth: Payer: Self-pay | Admitting: Internal Medicine

## 2024-01-12 NOTE — Telephone Encounter (Signed)
 Pt called in asking to speak only to a nurse.

## 2024-01-12 NOTE — Telephone Encounter (Signed)
 Spoke to patient. He states he has a UTI.  He has been getting then frequently.  Patient wanted to know if he could take VIT C 200 mg daily as well as cranberries. A friend told him to take this to help.   RN asked patient how does he know he has  an UTI. Patient stated urologist did a urine sample.   RN informed patient to contact Urologist -what would thy prescribe once he knows he can call to confirm that is does not interfere with cardiac mediations  [facility] can then ask pharmacy teams input. Patient verbalized understanding

## 2024-01-27 ENCOUNTER — Other Ambulatory Visit: Payer: Self-pay | Admitting: Internal Medicine

## 2024-02-08 ENCOUNTER — Ambulatory Visit: Admitting: Podiatry

## 2024-02-25 ENCOUNTER — Telehealth: Payer: Self-pay | Admitting: Internal Medicine

## 2024-02-25 NOTE — Telephone Encounter (Signed)
 Patient calling to see what meds he can take with having a cold. Please

## 2024-02-25 NOTE — Telephone Encounter (Signed)
You want to avoid any product that says its "D" or "PE",  but DM (dextromethorphan) is ok to take for cough. The "D" means it has pseudoephedrine (Sudafed) in it and "PE" means it has phenylephrine in it. These both can increase your blood pressure and heart rate. You also want to avoid NSAID's like ibuprofen and naproxen. Tylenol is ok to take for pain/fever. You can try saline nasal rinses (such as a netti pot) for congestion along with Flonase or Atrovent. Cough drops are ok too for sore throat. Antihistamines like chlorpheniramine (can cause drowsiness) is good for runny nose/sneezing. Antihistamines like allegra and zyrtec are ok to take as well. These do not cause drowsiness typically. For a productive cough, can take generic or brand Mucinex. For a dry cough generic or brand Delsym (dextromethorphan).  

## 2024-02-25 NOTE — Telephone Encounter (Signed)
 Patient says he developed a cold last night and requests to speak with a triage nurse regarding having a cold while taking cardiac medications.

## 2024-02-25 NOTE — Telephone Encounter (Signed)
 Went over pharmacy advice, will send to patients mychart per request. Also made 17yr f/u appt with Dr Okey.

## 2024-02-25 NOTE — Telephone Encounter (Signed)
 Returned call to patient.  He complains of cough, congestion, normal cold symptoms.  Advised to use lozenges for sore throat, Delsym for cough, cetirizine/loratadine for runny nose.  If he gets congested, best to use steam/hot shower to get into sinuses and open them  Patient voiced understanding.

## 2024-02-29 ENCOUNTER — Emergency Department (HOSPITAL_COMMUNITY)

## 2024-02-29 ENCOUNTER — Ambulatory Visit
Admission: EM | Admit: 2024-02-29 | Discharge: 2024-02-29 | Disposition: A | Discharge status: Home / Self Care | Attending: Family Medicine | Admitting: Family Medicine

## 2024-02-29 ENCOUNTER — Ambulatory Visit: Payer: Self-pay

## 2024-02-29 ENCOUNTER — Encounter (HOSPITAL_COMMUNITY): Payer: Self-pay

## 2024-02-29 ENCOUNTER — Emergency Department (HOSPITAL_COMMUNITY)
Admission: EM | Admit: 2024-02-29 | Discharge: 2024-02-29 | Disposition: A | Discharge status: Home / Self Care | Attending: Emergency Medicine | Admitting: Emergency Medicine

## 2024-02-29 ENCOUNTER — Other Ambulatory Visit: Payer: Self-pay

## 2024-02-29 DIAGNOSIS — N189 Chronic kidney disease, unspecified: Secondary | ICD-10-CM | POA: Insufficient documentation

## 2024-02-29 DIAGNOSIS — R051 Acute cough: Secondary | ICD-10-CM | POA: Diagnosis not present

## 2024-02-29 DIAGNOSIS — I959 Hypotension, unspecified: Secondary | ICD-10-CM | POA: Diagnosis not present

## 2024-02-29 DIAGNOSIS — B349 Viral infection, unspecified: Secondary | ICD-10-CM | POA: Diagnosis not present

## 2024-02-29 DIAGNOSIS — J101 Influenza due to other identified influenza virus with other respiratory manifestations: Secondary | ICD-10-CM | POA: Insufficient documentation

## 2024-02-29 DIAGNOSIS — R509 Fever, unspecified: Secondary | ICD-10-CM | POA: Diagnosis present

## 2024-02-29 LAB — URINALYSIS, ROUTINE W REFLEX MICROSCOPIC
Bilirubin Urine: NEGATIVE
Glucose, UA: NEGATIVE mg/dL
Ketones, ur: NEGATIVE mg/dL
Nitrite: NEGATIVE
Protein, ur: 100 mg/dL — AB
Specific Gravity, Urine: 1.024 (ref 1.005–1.030)
WBC, UA: >50 WBC/hpf (ref 0–5)
pH: 5 (ref 5.0–8.0)

## 2024-02-29 LAB — COMPREHENSIVE METABOLIC PANEL WITH GFR
ALT: 32 U/L (ref 0–44)
AST: 48 U/L — ABNORMAL HIGH (ref 15–41)
Albumin: 3.6 g/dL (ref 3.5–5.0)
Alkaline Phosphatase: 79 U/L (ref 38–126)
Anion gap: 11 (ref 5–15)
BUN: 38 mg/dL — ABNORMAL HIGH (ref 8–23)
CO2: 24 mmol/L (ref 22–32)
Calcium: 8.5 mg/dL — ABNORMAL LOW (ref 8.9–10.3)
Chloride: 106 mmol/L (ref 98–111)
Creatinine, Ser: 1.28 mg/dL — ABNORMAL HIGH (ref 0.61–1.24)
GFR, Estimated: 56 mL/min — ABNORMAL LOW
Glucose, Bld: 116 mg/dL — ABNORMAL HIGH (ref 70–99)
Potassium: 3.9 mmol/L (ref 3.5–5.1)
Sodium: 141 mmol/L (ref 135–145)
Total Bilirubin: 0.8 mg/dL (ref 0.0–1.2)
Total Protein: 6.9 g/dL (ref 6.5–8.1)

## 2024-02-29 LAB — CBC WITH DIFFERENTIAL/PLATELET
Abs Immature Granulocytes: 0.04 K/uL (ref 0.00–0.07)
Basophils Absolute: 0 K/uL (ref 0.0–0.1)
Basophils Relative: 0 %
Eosinophils Absolute: 0 K/uL (ref 0.0–0.5)
Eosinophils Relative: 0 %
HCT: 32.8 % — ABNORMAL LOW (ref 39.0–52.0)
Hemoglobin: 10.6 g/dL — ABNORMAL LOW (ref 13.0–17.0)
Immature Granulocytes: 0 %
Lymphocytes Relative: 8 %
Lymphs Abs: 1 K/uL (ref 0.7–4.0)
MCH: 32.2 pg (ref 26.0–34.0)
MCHC: 32.3 g/dL (ref 30.0–36.0)
MCV: 99.7 fL (ref 80.0–100.0)
Monocytes Absolute: 0.8 K/uL (ref 0.1–1.0)
Monocytes Relative: 7 %
Neutro Abs: 9.8 K/uL — ABNORMAL HIGH (ref 1.7–7.7)
Neutrophils Relative %: 85 %
Platelets: 181 K/uL (ref 150–400)
RBC: 3.29 MIL/uL — ABNORMAL LOW (ref 4.22–5.81)
RDW: 15.2 % (ref 11.5–15.5)
WBC: 11.7 K/uL — ABNORMAL HIGH (ref 4.0–10.5)
nRBC: 0 % (ref 0.0–0.2)

## 2024-02-29 LAB — RESP PANEL BY RT-PCR (RSV, FLU A&B, COVID)  RVPGX2
Influenza A by PCR: POSITIVE — AB
Influenza B by PCR: NEGATIVE
Resp Syncytial Virus by PCR: NEGATIVE
SARS Coronavirus 2 by RT PCR: NEGATIVE

## 2024-02-29 LAB — TROPONIN T, HIGH SENSITIVITY
Troponin T High Sensitivity: 68 ng/L — ABNORMAL HIGH (ref 0–19)
Troponin T High Sensitivity: 57 ng/L — ABNORMAL HIGH (ref 0–19)

## 2024-02-29 LAB — POC COVID19/FLU A&B COMBO
Covid Antigen, POC: NEGATIVE
Influenza A Antigen, POC: NEGATIVE
Influenza B Antigen, POC: NEGATIVE

## 2024-02-29 MED ORDER — SODIUM CHLORIDE 0.9 % IV BOLUS
1000.0000 mL | Freq: Once | INTRAVENOUS | Status: AC
  Administered 2024-02-29: 1000 mL via INTRAVENOUS

## 2024-02-29 NOTE — ED Triage Notes (Signed)
 Pt states that he also hasn't had an appetite.

## 2024-02-29 NOTE — ED Triage Notes (Signed)
 Pt has a cough, fever, and wheezing. X2-3 days Pt has had a fever of 102.7.  Pt has been taking Tylenol  and Delsym otc.  Pt had Tylenol  at 12:30 today.

## 2024-02-29 NOTE — Telephone Encounter (Signed)
 Pt daughter calling back in, pt is getting worse. Please advise.

## 2024-02-29 NOTE — Telephone Encounter (Signed)
 FYI Only or Action Required?: FYI only for provider: UC advised.  Patient was last seen in primary care on 11/17/2023 by [name], [name], MD.  Called Nurse Triage reporting Fever.  Symptoms began several days ago.  Interventions attempted: OTC medications: delsym, tylenol .  Symptoms are: gradually worsening.  Triage Disposition: See HCP Within 4 Hours (Or PCP Triage)  Patient/caregiver understands and will follow disposition?: Yes    Copied from CRM #[id number]. Topic: Clinical - Red Word Triage >> Feb 29, 2024 12:49 PM [name] wrote: [facility] that prompted transfer to Nurse Triage: Patient has fever of 103 this morning, wheezing, Blood pressure has been low 94/46, coughing and legs feeling week     Reason for Disposition  [1] Fever > 101 F (38.3 C) AND [2] age > 60 years  Answer Assessment - Initial Assessment Questions Pt's daughter contacted clinic stating that pt has been having intermittent fevers, started Saturday 01/10, was afebrile for 48h then fevered this morning, 105F then 102F today after tylenol . Pt is eating bland diet and drinking Gatorade. Per urology, 1/06, no UTI per urine specimen; pt does have hx of UTIs. Pt has not had appetite, BP lower this morning 94/46, usually 110s/60s. Denies any leg cramps, no dizziness, no SOB, no chest pain. Discussed based on pt's symptoms, pt should be evaluated, discussed pt to go to UC and provided daughter with address for [facility] and wait time of 30 minutes. Discussed pt to go for eval. She is going to drive pt but requested appt with Dr. [name] for tomorrow as well and pt was wanting to see her to establish care. Based on symptoms, acute visit scheduled. Pt to go to UC at this time and f/u with office to cancel/reschedule appt if no further questions/concerns after UC visit.      1. TEMPERATURE: What is the most recent temperature?  How was it measured?      102F oral; around noon after tylenol    2. ONSET: When did the  fever start?      Fever started Saturday, 1/10, afebrile Sunday and Monday and fever back today, 105F this morning   3. CHILLS: Do you have chills? If yes: How bad are they?  (e.Herrera., none, mild, moderate, severe)     No   4. OTHER SYMPTOMS: Do you have any other symptoms besides the fever?  (e.Herrera., abdomen pain, cough, diarrhea, earache, headache, sore throat, urination pain)     Lack of appetite, cough, leg weakness   5. CAUSE: If there are no symptoms, ask: What do you think is causing the fever?      Unsure; pt was tested for UTI by urology but specimen negative, 01/06   6. CONTACTS: Does anyone else in the family have an infection?     No   7. TREATMENT: What have you done so far to treat this fever? (e.Herrera., OTC fever medicines)     Tylenol ; delsym  Protocols used: [facility]

## 2024-02-29 NOTE — ED Provider Notes (Signed)
 " [place] [facility]ERG[facility] DEPARTMENT AT [facility] Provider Note   CSN: [id number] Arrival date & time: 02/29/24  1526     Patient presents with: Weakness   Luis Herrera is a 84 y.o. male presenting to the ER with approximately 5 days of chest congestion, coughing, fatigue, weakness.  He is here in the company of his family.  He reports he had a negative rapid testing for influenza at urgent care today.  However there was concern at urgent care about lower blood pressure was told to come into the ED.  He denies persistent chest pain.  He reports he sees urology and has a chronic UTI but had negative cultures performed earlier this week.  He is not currently on antibiotics but completed a course earlier this month for potential UTI.  He has on prophylactic antibiotics for UTI.  He denies dysuria.  He has had a worsening cough and feels more short of breath than normal the past few days.   HPI     Prior to Admission medications  Medication Sig Start Date End Date Taking? Authorizing Provider  amiodarone  (PACERONE ) 200 MG tablet TAKE 1/2 TABLET BY MOUTH DAILY 06/02/23   Okey Vina GAILS, MD  amLODipine  (NORVASC ) 5 MG tablet TAKE 1 TABLET (5 MG TOTAL) BY MOUTH DAILY. 12/13/23   Okey Vina GAILS, MD  aspirin  EC 81 MG tablet Take 1 tablet (81 mg total) by mouth daily. Swallow whole. 11/02/19   Okey Vina GAILS, MD  atorvastatin  (LIPITOR ) 80 MG tablet TAKE 1 TABLET BY MOUTH EVERY DAY 12/07/23   Ross, Paula V, MD  Calcium  Carb-Cholecalciferol (CALCIUM  PLUS VITAMIN D3 PO) Take 2 tablets by mouth daily. Gummy supplements    [provider]  carvedilol  (COREG ) 3.125 MG tablet TAKE 1 TABLET BY MOUTH 2 TIMES DAILY. 10/28/23   Okey Vina GAILS, MD  cephALEXin  (KEFLEX ) 250 MG capsule Take 250 mg by mouth at bedtime. 05/24/23   [provider]  ezetimibe  (ZETIA ) 10 MG tablet TAKE 1 TABLET BY MOUTH EVERY DAY 12/13/23   Okey Vina GAILS, MD  furosemide  (LASIX ) 40 MG tablet TAKE ONE TABLET (40 MG)  EVERY OTHER DAY ALTERNATING WITH 1 1/2 TABLETS (60 MG) ON THE OPPOSITE DAYS. 12/13/23   Okey Vina GAILS, MD  levothyroxine  (SYNTHROID ) 50 MCG tablet Take 1 tablet (50 mcg total) by mouth daily before breakfast. 07/01/23   Okey Vina GAILS, MD  Multiple Vitamins-Minerals (PRESERVISION AREDS 2) CAPS Take 1 capsule by mouth 2 (two) times daily.    [provider]  sacubitril -valsartan  (ENTRESTO ) 24-26 MG TAKE 1 TABLET BY MOUTH TWICE A DAY 01/27/24   Okey Vina GAILS, MD  tamsulosin  (FLOMAX ) 0.4 MG CAPS capsule Take 0.4 mg by mouth in the morning and at bedtime.    [provider]    Allergies: Sulfa  antibiotics and Bactrim  [sulfamethoxazole -trimethoprim ]    Review of Systems  Updated Vital Signs BP (!) 154/61   Pulse (!) 49   Temp (!) 97.4 F (36.3 C) (Oral)   Resp 18   Ht 5' 11 (1.803 m)   Wt 88 kg   SpO2 100%   BMI 27.06 kg/m   Physical Exam Constitutional:      General: He is not in acute distress. HENT:     Head: Normocephalic and atraumatic.  Eyes:     Conjunctiva/sclera: Conjunctivae normal.     Pupils: Pupils are equal, round, and reactive to light.  Cardiovascular:     Rate and Rhythm: Normal  rate and regular rhythm.  Pulmonary:     Effort: Pulmonary effort is normal. No respiratory distress.     Comments: Speaking full sentences, no tachypnea, diminished breath sounds or rhonchi in the lower lobes Abdominal:     General: There is no distension.     Tenderness: There is no abdominal tenderness.  Skin:    General: Skin is warm and dry.  Neurological:     General: No focal deficit present.     Mental Status: He is alert. Mental status is at baseline.  Psychiatric:        Mood and Affect: Mood normal.        Behavior: Behavior normal.     (all labs ordered are listed, but only abnormal results are displayed) Labs Reviewed  RESP PANEL BY RT-PCR (RSV, FLU A&B, COVID)  RVPGX2 - Abnormal; Notable for the following components:      Result Value   Influenza  A by PCR POSITIVE (*)    All other components within normal limits  COMPREHENSIVE METABOLIC PANEL WITH GFR - Abnormal; Notable for the following components:   Glucose, Bld 116 (*)    BUN 38 (*)    Creatinine, Ser 1.28 (*)    Calcium  8.5 (*)    AST 48 (*)    GFR, Estimated 56 (*)    All other components within normal limits  CBC WITH DIFFERENTIAL/PLATELET - Abnormal; Notable for the following components:   WBC 11.7 (*)    RBC 3.29 (*)    Hemoglobin 10.6 (*)    HCT 32.8 (*)    Neutro Abs 9.8 (*)    All other components within normal limits  URINALYSIS, ROUTINE W REFLEX MICROSCOPIC - Abnormal; Notable for the following components:   Color, Urine AMBER (*)    APPearance TURBID (*)    Hgb urine dipstick SMALL (*)    Protein, ur 100 (*)    Leukocytes,Ua LARGE (*)    Bacteria, UA MANY (*)    Non Squamous Epithelial 0-5 (*)    All other components within normal limits  TROPONIN T, HIGH SENSITIVITY - Abnormal; Notable for the following components:   Troponin T High Sensitivity 68 (*)    All other components within normal limits  TROPONIN T, HIGH SENSITIVITY - Abnormal; Notable for the following components:   Troponin T High Sensitivity 57 (*)    All other components within normal limits  URINE CULTURE    EKG: EKG Interpretation Date/Time:  Tuesday February 29 2024 16:17:33 EST Ventricular Rate:  78 PR Interval:  174 QRS Duration:  146 QT Interval:  452 QTC Calculation: 515 R Axis:   -28  Text Interpretation: Sinus rhythm with sinus arrhythmia with frequent Premature ventricular complexes Right bundle branch block When compared with ECG of 22-Oct-2023 11:14, PREVIOUS ECG IS PRESENT No STEMI Confirmed by Cottie Cough [phone]) on 02/29/2024 5:54:06 PM  Radiology: CT Chest Wo Contrast Result Date: 02/29/2024 CLINICAL DATA:  Weakness EXAM: CT CHEST WITHOUT CONTRAST TECHNIQUE: Multidetector CT imaging of the chest was performed following the standard protocol without IV contrast.  RADIATION DOSE REDUCTION: This exam was performed according to the departmental dose-optimization program which includes automated exposure control, adjustment of the mA and/or kV according to patient size and/or use of iterative reconstruction technique. COMPARISON:  Chest x-ray 02/29/2024, chest CT 09/19/2021 FINDINGS: Cardiovascular: Limited assessment without intravenous contrast. Advanced aortic atherosclerosis. Aortic valve prosthesis. Multi vessel coronary vascular calcification. Cardiomegaly. No pericardial effusion. Mitral calcifications Mediastinum/Nodes: Patent trachea. No  suspicious thyroid  mass. No suspicious lymph nodes. Esophagus within normal limits. Lungs/Pleura: Mild scarring at the right apex. No acute airspace disease, pleural effusion, or pneumothorax Upper Abdomen: No acute finding. Surgical clips in the right upper quadrant. Stable probable exophytic hepatic cyst at the porta hepatis. Musculoskeletal: No acute or suspicious osseous abnormality. Advanced multilevel degenerative changes. IMPRESSION: 1. No CT evidence for acute intrathoracic abnormality. 2. Cardiomegaly. 3. Aortic atherosclerosis. Aortic Atherosclerosis (ICD10-I70.0). Electronically Signed   By: Luke Bun M.D.   On: 02/29/2024 19:03   DG Chest 1 View Result Date: 02/29/2024 EXAM: 1 VIEW(S) XRAY OF THE CHEST 02/29/2024 04:54:00 PM COMPARISON: 09/29/2021 CLINICAL HISTORY: The patient presents with cough and fever. ICD10: 10031 (Cough), 755907 (Fever). FINDINGS: LUNGS AND PLEURA: No focal pulmonary opacity. No pleural effusion. No pneumothorax. HEART AND MEDIASTINUM: Tortuous aorta with aortic atherosclerosis. No acute abnormality of the cardiac and mediastinal silhouettes. BONES AND SOFT TISSUES: Sternotomy wires noted. No acute osseous abnormality. IMPRESSION: 1. No acute cardiopulmonary abnormality. Electronically signed by: Rogelia Myers MD 02/29/2024 05:00 PM EST RP Workstation: [id number]     Procedures    Medications Ordered in the ED  sodium chloride  0.9 % bolus 1,000 mL (0 mLs Intravenous Stopped 02/29/24 1923)                                    Medical Decision Making Amount and/or Complexity of Data Reviewed Labs: ordered. Radiology: ordered.   This patient presents to the ED with concern for shortness of breath, cough. This involves an extensive number of treatment options, and is a complaint that carries with it a high risk of complications and morbidity.  The differential diagnosis includes viral URI including influenza or COVID versus bacterial infection or pneumonia versus anemia versus arrhythmia versus atypical ACS versus other  Co-morbidities that complicate the patient evaluation: Advanced age, cardiovascular risk factor  Additional history obtained from patient's daughter and wife at the bedside  External records from outside source obtained and reviewed including cardiology report and echocardiogram from August 2025 noting the patient has an AV prosthesis that appears stable at the time  I ordered and personally interpreted labs.  The pertinent results include: Chronic baseline anemia hemoglobin 10.6.  Troponin 68 --> 57 .  Chronic kidney disease, creatinine near baseline at 1.3.  Some out elevation of BUN.  UA shows large leukocytes and many bacteria, negative nitrites, suspect this is chronically colonized given his history.  He does not have dysuria symptoms to raise concern at this time for urinary tract infection   Influenza A test positive.  I suspect this is etiology of the patient's symptoms.  He is now on day 5 of symptoms and I did not think he would benefit from Tamiflu at this point, given concern for side effects.  It has low efficacy if initiated beyond the first 48 hours.  I ordered imaging studies including x-ray chest, CT chest I independently visualized and interpreted imaging which showed no emergent findings I agree with the radiologist  interpretation  The patient was maintained on a cardiac monitor.  I personally viewed and interpreted the cardiac monitored which showed an underlying rhythm of: Sinus rhythm with frequent PVCs  Per my interpretation the patient's ECG shows sinus rhythm with frequent PVCs  I ordered medication including IV fluids for elevated BUN and possible mild dehydration  I have reviewed the patients home medicines and have made adjustments  as needed  Test Considered: Low suspicion for sepsis, bacteremia   Disposition:  After consideration of the diagnostic results and the patients response to treatment, I feel that the patent would benefit from outpatient follow-up.      Final diagnoses:  Influenza A    ED Discharge Orders     None          Cottie Donnice PARAS, MD 02/29/24 2003  "

## 2024-02-29 NOTE — Telephone Encounter (Signed)
 Daughter calling stating that patient is getting much worse. Fever is the worst it has been since he got sick-today at 103. Congestion worsening and just feeling bad. Advised daughter to have patient seen at PCP or UC, and if worsening SOB would need to be seen in ED. Daughter requesting patient coming to our office, as he sees Dr Okey for everything. Again, advised [name] that we recommend he NOT come to our office with a fever and these symptoms. He can follow-up with us , once treated and improving. ED recommendations given again if patient feels he needs immediate help. OTC medications discussed with patient last week via pharmacy, when sickness started. Will update Dr Okey with updates. Verbalized understanding of plan.

## 2024-02-29 NOTE — ED Triage Notes (Signed)
 Pt's blood pressure has been running a little lower than normal.

## 2024-02-29 NOTE — Discharge Instructions (Addendum)
 Your blood pressure is significantly low and given your sick symptoms, recommend an emergency room visit to rule out severe illness, systemic infection.  Please go straight there.

## 2024-02-29 NOTE — Telephone Encounter (Signed)
"  Appt 11/4.  "

## 2024-02-29 NOTE — ED Provider Triage Note (Signed)
 Emergency Medicine Provider Triage Evaluation Note  Leeandre Nordling , a 84 y.o. male  was evaluated in triage.  Pt complains of flulike symptoms for the past 4 days.  Notes generalized cough, fatigue, weakness, and fevers.  Went to urgent care earlier today and had a negative respiratory failure.  Sent to ED for low blood pressure.  Notes generalized bodyaches but denies chest pain or shortness of breath.  Has been taking home medications as prescribed.  Review of Systems  Positive: See above Negative: Chest pain, shortness of breath, syncope  Physical Exam  BP (!) 102/50   Pulse 71   Temp 97.7 F (36.5 C)   Resp 18   Ht 5' 11 (1.803 m)   Wt 88 kg   SpO2 96%   BMI 27.06 kg/m  Gen:   Awake, no distress   Resp:  Normal effort  MSK:   Moves extremities without difficulty  Other:    Medical Decision Making  Medically screening exam initiated at 4:07 PM.  Appropriate orders placed.  Wallice Granville was informed that the remainder of the evaluation will be completed by another provider, this initial triage assessment does not replace that evaluation, and the importance of remaining in the ED until their evaluation is complete.     Neysa Thersia RAMAN, [place] 02/29/24 [phone]

## 2024-02-29 NOTE — ED Triage Notes (Signed)
 Pt to ED from UC c/o flu like symptoms since Friday. Cough, fever, generalized weakness. Reports negative respiratory panel at UC, Reports last tylenol  dose 1230. Sent to ED d/t hypotension. Pt denies pain. Denies [facility]

## 2024-02-29 NOTE — ED Triage Notes (Signed)
 Pt to er, pt states that he has been feeling poorly since Thursday night.  States that he was at the clinic and they sent him over for  low blood pressure, 88/51  pt ambulatory in waiting room

## 2024-02-29 NOTE — ED Notes (Signed)
 Patient transported to CT

## 2024-02-29 NOTE — ED Notes (Signed)
 Awaiting patient from lobby.

## 2024-02-29 NOTE — ED Provider Notes (Signed)
 " [facility]  Note:  This document was prepared using Conservation officer, historic buildings and may include unintentional dictation errors.  MRN: [id number] DOB: May 15, 1940  Subjective:   Luis Herrera is a 84 y.o. male presenting for 2 to 3-day history of malaise, fatigue, fevers, wheezing, coughing, decreased appetite.  Highest temperature was 102.18F.  Has been using Tylenol  and Delsym.  Last dosing of Tylenol  was at 1230 today.  No chest pain, shortness of breath, confusion, headache.  Patient has a history of CAD s/p CABG, right bundle branch block, frequent PVCs, aortic valve replacement, heart failure with preserved ejection fraction.  The most recent cardiac procedure was the aortic valve replacement on 09/30/2021.  He does take amiodarone , amlodipine , furosemide , carvedilol , Entresto .  Patient's daughter did reach out to the cardiology office and recommended he present to our clinic.  Current Outpatient Medications  Medication Instructions   amiodarone  (PACERONE ) 100 mg, Oral, Daily   amLODipine  (NORVASC ) 5 mg, Oral, Daily   aspirin  EC 81 mg, Oral, Daily, Swallow whole.   atorvastatin  (LIPITOR ) 80 mg, Oral, Daily   Calcium  Carb-Cholecalciferol (CALCIUM  PLUS VITAMIN D3 PO) 2 tablets, Daily   carvedilol  (COREG ) 3.125 mg, Oral, 2 times daily   cephALEXin  (KEFLEX ) 250 mg, Daily at bedtime   ezetimibe  (ZETIA ) 10 mg, Oral, Daily   furosemide  (LASIX ) 40 MG tablet TAKE ONE TABLET (40 MG) EVERY OTHER DAY ALTERNATING WITH 1 1/2 TABLETS (60 MG) ON THE OPPOSITE DAYS.   levothyroxine  (SYNTHROID ) 50 mcg, Oral, Daily before breakfast   Multiple Vitamins-Minerals (PRESERVISION AREDS 2) CAPS 1 capsule, 2 times daily   sacubitril -valsartan  (ENTRESTO ) 24-26 MG 1 tablet, Oral, 2 times daily   tamsulosin  (FLOMAX ) 0.4 mg, 2 times daily    Allergies[1]  Past Medical History:  Diagnosis Date   Aortic stenosis 2008   Arthritis    Blood transfusion    Blood transfusion without  reported diagnosis    CAD (coronary artery disease) 2008   Cataract    Complication of anesthesia    hallucinated once   H/O hiatal hernia    Heart murmur    AFTER REPLACED VALVE WENT AWAY   Hx of colonic polyps    Hyperlipidemia    Hypertension    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 Wendel and Dr. Lucas   Thyroid  disease      Past Surgical History:  Procedure Laterality Date   AORTIC VALVE REPLACEMENT  2009   Bioprosthetic at [facility]   CARDIAC CATHETERIZATION  2008   CHOLECYSTECTOMY  1982   COLONOSCOPY     CORONARY ARTERY BYPASS GRAFT  2009   SVG-PDA w/ AVR   CORRECTION HAMMER TOE  2011   BILATERAL   GASTROCNEMIUS RECESSION  05/28/2011   INTRAOPERATIVE TRANSTHORACIC ECHOCARDIOGRAM N/A 09/30/2021   Procedure: INTRAOPERATIVE TRANSTHORACIC ECHOCARDIOGRAM;  Surgeon: Wendel Lurena POUR, MD;  Location: [facility] INVASIVE CV LAB;  Service: Open Heart Surgery;  Laterality: N/A;   POLYPECTOMY     RIGHT/LEFT HEART CATH AND CORONARY/GRAFT ANGIOGRAPHY N/A 09/24/2021   Procedure: RIGHT/LEFT HEART CATH AND CORONARY/GRAFT ANGIOGRAPHY;  Surgeon: Wendel Lurena POUR, MD;  Location: [facility] INVASIVE CV LAB;  Service: Cardiovascular;  Laterality: N/A;   TONSILLECTOMY     TOTAL HIP ARTHROPLASTY Left 2000   TOTAL KNEE ARTHROPLASTY  2000   left   TOTAL KNEE ARTHROPLASTY  12/01/2011   Procedure: TOTAL KNEE ARTHROPLASTY;  Surgeon: Donnice  JONETTA Car, MD;  Location: [facility] ORS;  Service: Orthopedics;  Laterality: Right;   TRANSCATHETER AORTIC VALVE REPLACEMENT, TRANSFEMORAL Right 09/30/2021   Procedure: Transcatheter Aortic Valve Replacement, Transfemoral;  Surgeon: Wendel Lurena POUR, MD;  Location: [facility] INVASIVE CV LAB;  Service: Open Heart Surgery;  Laterality: Right;   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   Procedure: TRANSURETHRAL RESECTION OF BLADDER TUMOR (TURBT) BILATERAL RETROGRADE PYELOGRAM;  Surgeon:  Carolee Sherwood JONETTA DOUGLAS, MD;  Location: [facility] OR[facility];  Service: Urology;  Laterality: Bilateral;  45 MINS FOR CASE   WEIL OSTEOTOMY  05/28/2011    Family History  Problem Relation Age of Onset   Colon cancer Mother 70   Heart attack Father    Hypertension Other    Hyperlipidemia Other    Heart attack Brother    Hypertension Sister    Hypertension Brother    Stroke Neg Hx    Esophageal cancer Neg Hx    Rectal cancer Neg Hx    Stomach cancer Neg Hx     Social History   Occupational History   Occupation: retired  Tobacco Use   Smoking status: Former    Current packs/day: 0.00    Types: Cigarettes    Quit date: 05/25/1973    Years since quitting: 50.8   Smokeless tobacco: Never  Vaping Use   Vaping status: Never Used  Substance and Sexual Activity   Alcohol use: Not Currently    Comment: rare   Drug use: No   Sexual activity: Not Currently     ROS   Objective:   Vitals: BP (!) 91/48 (BP Location: Left Arm)   Pulse 79   Temp 97.9 F (36.6 C) (Oral)   Resp 17   Ht 5' 11 (1.803 m)   Wt 194 lb (88 kg)   SpO2 95%   BMI 27.06 kg/m   BP Readings from Last 3 Encounters:  02/29/24 (!) 91/48  11/17/23 124/62  10/22/23 120/60   Physical Exam Constitutional:      General: He is not in acute distress.    Appearance: Normal appearance. He is well-developed. He is not ill-appearing, toxic-appearing or diaphoretic.  HENT:     Head: Normocephalic and atraumatic.     Right Ear: External ear normal.     Left Ear: External ear normal.     Nose: Nose normal.     Mouth/Throat:     Mouth: Mucous membranes are moist.  Eyes:     General: No scleral icterus.       Right eye: No discharge.        Left eye: No discharge.     Extraocular Movements: Extraocular movements intact.  Cardiovascular:     Rate and Rhythm: Normal rate and regular rhythm.     Heart sounds: Normal heart sounds. No murmur heard.    No friction rub. No gallop.  Pulmonary:     Effort: Pulmonary effort is  normal. No respiratory distress.     Breath sounds: Normal breath sounds. No stridor. No wheezing, rhonchi or rales.  Neurological:     General: No focal deficit present.     Mental Status: He is alert and oriented to person, place, and time. Mental status is at baseline.  Psychiatric:        Mood and Affect: Mood normal.        Behavior: Behavior normal.        Thought Content: Thought content normal.    Results for orders placed or  performed during the hospital encounter of 02/29/24 (from the past 24 hours)  POC Covid19/Flu A&B Antigen     Status: None   Collection Time: 02/29/24  3:02 PM  Result Value Ref Range   Influenza A Antigen, POC Negative Negative   Influenza B Antigen, POC Negative Negative   Covid Antigen, POC Negative Negative    Assessment and Plan :   PDMP not reviewed this encounter.  1. Hypotension, unspecified hypotension type   2. Acute cough   3. Acute viral syndrome      Patient has significant hypotension likely due to an underlying acute viral syndrome.  Respiratory testing was negative.  Discussed this with patient and his family, recommendation is to present to the emergency room for further testing and ruling out systemic infection, stabilizing his blood pressure.  I deferred doing fluids given his history of heart failure and possibility of delaying care he may need should this be a manifestation of systemic infection.  Patient and family will present by personal vehicle.    [1]  Allergies Allergen Reactions   Sulfa  Antibiotics Other (See Comments)    Garnette Louder Syndrome   Bactrim  [Sulfamethoxazole -Trimethoprim ] Other (See Comments)    Garnette Louder Syndrome     Christopher Savannah, PA-C 02/29/24 1505  "

## 2024-03-01 ENCOUNTER — Ambulatory Visit: Admitting: Family Medicine

## 2024-03-03 LAB — URINE CULTURE: Culture: >=100000 — AB

## 2024-03-04 ENCOUNTER — Telehealth (HOSPITAL_BASED_OUTPATIENT_CLINIC_OR_DEPARTMENT_OTHER): Payer: Self-pay | Admitting: *Deleted

## 2024-03-04 NOTE — Telephone Encounter (Signed)
 Post ED Visit - Positive Culture Follow-up  Culture report reviewed by antimicrobial stewardship pharmacist: [facility] []  Rankin Dee, Pharm.D. []  Venetia Gully, Pharm.D., BCPS AQ-ID []  Garrel Crews, Pharm.D., BCPS []  Almarie Lunger, Pharm.D., BCPS []  [place], [address].D., BCPS, AAHIVP []  Rosaline Bihari, Pharm.D., BCPS, AAHIVP []  Vernell Meier, PharmD, BCPS []  Latanya Hint, PharmD, BCPS []  Donald Medley, PharmD, BCPS []  Rocky Bold, PharmD []  Dorothyann Alert, PharmD, BCPS [x]  Dorn Buttner,  PharmD  [facility] []  Rosaline Edison, PharmD []  Romona Bliss, PharmD []  Dolphus Roller, PharmD []  Veva Seip, Rph []  Vernell Daunt) Leonce, PharmD []  Eva Allis, PharmD []  Rosaline Millet, PharmD []  Iantha Batch, PharmD []  Arvin Gauss, PharmD []  Wanda Hasting, PharmD []  Ronal Rav, PharmD []  Rocky Slade, PharmD []  Bard Jeans, PharmD   Positive urine culture Called pt for symptom check.  Pt is not having symptoms of UTI.  No treatment needed. Told pt to follow up with PCP and no further patient follow-up is required at this time.  Albino Alan Novak 03/04/2024, 1:55 PM

## 2024-03-04 NOTE — Progress Notes (Signed)
 ED Antimicrobial Stewardship Positive Culture Follow Up   Luis Herrera is an 84 y.o. male who presented to [facility] on @ADMITDT @ with a chief complaint of  Chief Complaint  Patient presents with   Weakness    Recent Results (from the past 720 hours)  Urine Culture     Status: Abnormal   Collection Time: 02/29/24  5:54 PM   Specimen: Urine, Clean Catch  Result Value Ref Range Status   Specimen Description URINE, CLEAN CATCH  Final   Special Requests   Final    NONE Performed at [facility], [address], [place], [place] [address]    Culture >=100,000 COLONIES/mL ESCHERICHIA COLI (A)  Final   Report Status 03/03/2024 FINAL  Final   Organism ID, Bacteria ESCHERICHIA COLI (A)  Final      Susceptibility   Escherichia coli - MIC*    AMPICILLIN >=32 RESISTANT Resistant     CEFAZOLIN  (URINE) Value in next row Sensitive      8 SENSITIVEThis is a modified FDA-approved test that has been validated and its performance characteristics determined by the reporting laboratory.  This laboratory is certified under the Clinical Laboratory Improvement Amendments CLIA as qualified to perform high complexity clinical laboratory testing.    CEFEPIME Value in next row Sensitive      8 SENSITIVEThis is a modified FDA-approved test that has been validated and its performance characteristics determined by the reporting laboratory.  This laboratory is certified under the Clinical Laboratory Improvement Amendments CLIA as qualified to perform high complexity clinical laboratory testing.    ERTAPENEM Value in next row Sensitive      8 SENSITIVEThis is a modified FDA-approved test that has been validated and its performance characteristics determined by the reporting laboratory.  This laboratory is certified under the Clinical Laboratory Improvement Amendments CLIA as qualified to perform high complexity clinical laboratory testing.    CEFTRIAXONE  Value in next row Sensitive      8 SENSITIVEThis is a  modified FDA-approved test that has been validated and its performance characteristics determined by the reporting laboratory.  This laboratory is certified under the Clinical Laboratory Improvement Amendments CLIA as qualified to perform high complexity clinical laboratory testing.    CIPROFLOXACIN  Value in next row Intermediate      8 SENSITIVEThis is a modified FDA-approved test that has been validated and its performance characteristics determined by the reporting laboratory.  This laboratory is certified under the Clinical Laboratory Improvement Amendments CLIA as qualified to perform high complexity clinical laboratory testing.    GENTAMICIN Value in next row Sensitive      8 SENSITIVEThis is a modified FDA-approved test that has been validated and its performance characteristics determined by the reporting laboratory.  This laboratory is certified under the Clinical Laboratory Improvement Amendments CLIA as qualified to perform high complexity clinical laboratory testing.    NITROFURANTOIN Value in next row Sensitive      8 SENSITIVEThis is a modified FDA-approved test that has been validated and its performance characteristics determined by the reporting laboratory.  This laboratory is certified under the Clinical Laboratory Improvement Amendments CLIA as qualified to perform high complexity clinical laboratory testing.    TRIMETH /SULFA  Value in next row Sensitive      8 SENSITIVEThis is a modified FDA-approved test that has been validated and its performance characteristics determined by the reporting laboratory.  This laboratory is certified under the Clinical Laboratory Improvement Amendments CLIA as qualified to perform high complexity clinical laboratory  testing.    AMPICILLIN/SULBACTAM Value in next row Resistant      8 SENSITIVEThis is a modified FDA-approved test that has been validated and its performance characteristics determined by the reporting laboratory.  This laboratory is certified  under the Clinical Laboratory Improvement Amendments CLIA as qualified to perform high complexity clinical laboratory testing.    PIP/TAZO Value in next row Sensitive      <=4 SENSITIVEThis is a modified FDA-approved test that has been validated and its performance characteristics determined by the reporting laboratory.  This laboratory is certified under the Clinical Laboratory Improvement Amendments CLIA as qualified to perform high complexity clinical laboratory testing.    MEROPENEM Value in next row Sensitive      <=4 SENSITIVEThis is a modified FDA-approved test that has been validated and its performance characteristics determined by the reporting laboratory.  This laboratory is certified under the Clinical Laboratory Improvement Amendments CLIA as qualified to perform high complexity clinical laboratory testing.    * >=100,000 COLONIES/mL ESCHERICHIA COLI  Resp panel by RT-PCR (RSV, Flu A&B, Covid) Anterior Nasal Swab     Status: Abnormal   Collection Time: 02/29/24  6:08 PM   Specimen: Anterior Nasal Swab  Result Value Ref Range Status   SARS Coronavirus 2 by RT PCR NEGATIVE NEGATIVE Final   Influenza A by PCR POSITIVE (A) NEGATIVE Final   Influenza B by PCR NEGATIVE NEGATIVE Final    Comment: (NOTE) The Xpert Xpress SARS-CoV-2/FLU/RSV plus assay is intended as an aid in the diagnosis of influenza from Nasopharyngeal swab specimens and should not be used as a sole basis for treatment. Nasal washings and aspirates are unacceptable for Xpert Xpress SARS-CoV-2/FLU/RSV testing.  Fact Sheet for Patients: bloggercourse.com  Fact Sheet for Healthcare Providers: seriousbroker.it  This test is not yet approved or cleared by the [place]  FDA and has been authorized for detection and/or diagnosis of SARS-CoV-2 by FDA under an Emergency Use Authorization (EUA). This EUA will remain in effect (meaning this test can be used) for the  duration of the COVID-19 declaration under Section 564(b)(1) of the Act, 21 U.S.C. section 360bbb-3(b)(1), unless the authorization is terminated or revoked.     Resp Syncytial Virus by PCR NEGATIVE NEGATIVE Final    Comment: (NOTE) Fact Sheet for Patients: bloggercourse.com  Fact Sheet for Healthcare Providers: seriousbroker.it  This test is not yet approved or cleared by the [place]  FDA and has been authorized for detection and/or diagnosis of SARS-CoV-2 by FDA under an Emergency Use Authorization (EUA). This EUA will remain in effect (meaning this test can be used) for the duration of the COVID-19 declaration under Section 564(b)(1) of the Act, 21 U.S.C. section 360bbb-3(b)(1), unless the authorization is terminated or revoked.  Performed at [facility], [address] [place], [place] [address]     [x]  Patient sent home without prescription. Per ED note, patient with recurrent UTI and is on keflex  pta for ppx. Patient presenting with SOB and cough and tested positive for flu. No urinary symptoms noted. Culture above most likely represents asymptomatic bacteriuria.  Please call to assess for UTI symptoms/concern.  No symptoms >> no antibiotics needed Yes symptoms >> Call in cefpodoxime 200 mg BID x7 days (qty 14; refills 0)    ED Provider: Charlyn Dorn Buttner, PharmD, BCPS 03/04/2024 11:48 AM ED Clinical Pharmacist -  [phone]

## 2024-03-06 ENCOUNTER — Ambulatory Visit (INDEPENDENT_AMBULATORY_CARE_PROVIDER_SITE_OTHER): Admitting: Family Medicine

## 2024-03-06 ENCOUNTER — Encounter: Payer: Self-pay | Admitting: Family Medicine

## 2024-03-06 VITALS — BP 128/68 | HR 64 | Temp 97.5°F | Resp 16 | Ht 71.0 in | Wt 200.6 lb

## 2024-03-06 DIAGNOSIS — I493 Ventricular premature depolarization: Secondary | ICD-10-CM

## 2024-03-06 DIAGNOSIS — I10 Essential (primary) hypertension: Secondary | ICD-10-CM | POA: Diagnosis not present

## 2024-03-06 DIAGNOSIS — R7401 Elevation of levels of liver transaminase levels: Secondary | ICD-10-CM | POA: Diagnosis not present

## 2024-03-06 DIAGNOSIS — R8290 Unspecified abnormal findings in urine: Secondary | ICD-10-CM | POA: Diagnosis not present

## 2024-03-06 DIAGNOSIS — R059 Cough, unspecified: Secondary | ICD-10-CM

## 2024-03-06 LAB — BASIC METABOLIC PANEL WITH GFR
BUN: 23 mg/dL (ref 6–23)
CO2: 28 meq/L (ref 19–32)
Calcium: 8.8 mg/dL (ref 8.4–10.5)
Chloride: 104 meq/L (ref 96–112)
Creatinine, Ser: 1.21 mg/dL (ref 0.40–1.50)
GFR: 55.62 mL/min — ABNORMAL LOW
Glucose, Bld: 102 mg/dL — ABNORMAL HIGH (ref 70–99)
Potassium: 3.7 meq/L (ref 3.5–5.1)
Sodium: 139 meq/L (ref 135–145)

## 2024-03-06 LAB — HEPATIC FUNCTION PANEL
ALT: 22 U/L (ref 3–53)
AST: 32 U/L (ref 5–37)
Albumin: 3.6 g/dL (ref 3.5–5.2)
Alkaline Phosphatase: 73 U/L (ref 39–117)
Bilirubin, Direct: 0.3 mg/dL (ref 0.1–0.3)
Total Bilirubin: 1.3 mg/dL — ABNORMAL HIGH (ref 0.2–1.2)
Total Protein: 6.9 g/dL (ref 6.0–8.3)

## 2024-03-06 LAB — CBC
HCT: 31.6 % — ABNORMAL LOW (ref 39.0–52.0)
Hemoglobin: 10.6 g/dL — ABNORMAL LOW (ref 13.0–17.0)
MCHC: 33.6 g/dL (ref 30.0–36.0)
MCV: 92.9 fl (ref 78.0–100.0)
Platelets: 311 K/uL (ref 150.0–400.0)
RBC: 3.4 Mil/uL — ABNORMAL LOW (ref 4.22–5.81)
RDW: 15.1 % (ref 11.5–15.5)
WBC: 9.5 K/uL (ref 4.0–10.5)

## 2024-03-06 NOTE — Progress Notes (Unsigned)
 "  ACUTE VISIT Chief Complaint  Patient presents with   Sinus Problem    Cough since the 9th - Dx with the flu on 13th - cough improving.    HPI: [name] is a 84 y.o. male with PMHx significant for diastolic dysfunction, CAD, hypertension, RBBB, and hyperlipidemia here today with above complaint. Dx'ed with influenza A on 02/29/24 in the ED. CXR:No acute cardiopulmonary abnormality  Chest CT:  1. No CT evidence for acute intrathoracic abnormality. 2. Cardiomegaly. 3. Aortic atherosclerosis Discussed the use of AI scribe software for clinical note transcription with the patient, who gave verbal consent to proceed.  History of Present Illness Luis Herrera is an 84 year old male with heart failure and frequent PVCs who presents with a persistent cough following a recent flu diagnosis.  He has a persistent cough that began on January 9th, following a flu diagnosis on January 13th. The cough has not resolved, although it has improved. No sputum production is noted. He experienced wheezing at night, which has mostly resolved, with only occasional episodes. He reports that he has been drinking fluids.  He was hospitalized due to low blood pressure and was found to have the flu. During hospitalization, he received IV fluids, which improved his condition. He has not had a fever since the hospital visit, and his blood pressure has remained stable. No current difficulty breathing, chest pain, or palpitations are reported.  He has a history of urinary tract infections (UTIs) and was previously on a low-dose antibiotic, cephalexin , which was stopped two weeks prior to his illness. During his hospital stay, bacteria were found in his urine, but he reports no current symptoms of a UTI, such as burning or changes in urine stream, only an odor in the morning.  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 During his recent  hospital visit, his troponin levels were elevated, but he reports no current chest pain or palpitations. He has a cardiology appointment scheduled for April 2nd.  He reports a decrease in appetite since his illness, although it is improving. He also notes feeling more tired and experiencing weakness in his legs over the past year. He denies alcohol use and reports clear urine throughout the day.  Lab Results  Component Value Date   WBC 11.7 (H) 02/29/2024   HGB 10.6 (L) 02/29/2024   HCT 32.8 (L) 02/29/2024   MCV 99.7 02/29/2024   PLT 181 02/29/2024   Lab Results  Component Value Date   NA 141 02/29/2024   CL 106 02/29/2024   K 3.9 02/29/2024   CO2 24 02/29/2024   BUN 38 (H) 02/29/2024   CREATININE 1.28 (H) 02/29/2024   GFRNONAA 56 (L) 02/29/2024   CALCIUM  8.5 (L) 02/29/2024   PHOS 3.5 09/19/2019   ALBUMIN 3.6 02/29/2024   GLUCOSE 116 (H) 02/29/2024   Lab Results  Component Value Date   ALT 32 02/29/2024   AST 48 (H) 02/29/2024   ALKPHOS 79 02/29/2024   BILITOT 0.8 02/29/2024   Review of Systems See other pertinent positives and negatives in HPI.  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 Wendel and Dr. Lucas   Thyroid  disease    Allergies[2]  Social History   Socioeconomic History   Marital status: Married    Spouse name: Not on file   Number of children: 2   Years of education: 12   Highest education level: High school graduate  Occupational History   Occupation: retired  Tobacco  Use   Smoking status: Former    Current packs/day: 0.00    Types: Cigarettes    Quit date: 05/25/1973    Years since quitting: 50.8   Smokeless tobacco: Never  Vaping Use   Vaping status: Never Used  Substance and Sexual Activity   Alcohol use: Not Currently    Comment: rare   Drug use: No   Sexual activity: Not Currently  Other Topics Concern   Not on file  Social History Narrative   Regular exercise-yes. Pt lives in [facility] condo with his wife.   Social Drivers of Health   Tobacco Use: Medium Risk (03/06/2024)   Patient History    Smoking Tobacco Use: Former    Smokeless Tobacco Use: Never    Passive Exposure: Not on file  Financial Resource Strain: Low Risk (01/21/2022)   Overall Financial Resource Strain (CARDIA)    Difficulty of Paying Living Expenses: Not hard at all  Food Insecurity: No Food Insecurity (01/21/2022)   Hunger Vital Sign    Worried About Running Out of Food in the Last Year: Never true    Ran Out of Food in the Last Year: Never true  Transportation Needs: No Transportation Needs (01/21/2022)   PRAPARE - Administrator, Civil Service (Medical): No    Lack of Transportation (Non-Medical): No  Physical Activity: Sufficiently Active (04/08/2022)   Exercise Vital Sign    Days of Exercise per Week: 4 days    Minutes of Exercise per Session: 50 min  Stress: Not on file  Social Connections: Not on file  Depression (PHQ2-9): Low Risk (11/17/2023)   Depression (PHQ2-9)    PHQ-2 Score: 0  Alcohol Screen: Not on file  Housing: Low Risk (01/21/2022)   Housing    Last Housing Risk Score: 0  Utilities: Not At Risk (01/15/2022)   AHC Utilities    Threatened with loss of utilities: No  Health Literacy: Not on file   Vitals:   03/06/24 1044  BP: 128/68  Pulse: 64  Resp: 16  Temp: (!) 97.5 F (36.4 C)  SpO2: 98%   Body mass index is 27.98 kg/m.  Physical Exam Vitals and nursing note reviewed.  Constitutional:      General: He is not  in acute distress.    Appearance: He is well-developed. He is not ill-appearing.  HENT:     Head: Normocephalic and atraumatic.     Nose:     Right Sinus: No maxillary sinus tenderness or frontal sinus tenderness.     Left Sinus: No maxillary sinus tenderness or frontal sinus tenderness.     Mouth/Throat:     Mouth: Mucous membranes are dry.     Pharynx: Oropharynx is clear.  Eyes:     Conjunctiva/sclera: Conjunctivae normal.  Cardiovascular:     Rate and Rhythm: Bradycardia present. Rhythm irregular.  Heart sounds: No murmur heard.    Comments: Trace pitting LE edema, bilateral. DP pulses palpable. Pulmonary:     Effort: Pulmonary effort is normal. No respiratory distress.     Breath sounds: Normal breath sounds. No stridor.  Abdominal:     Palpations: Abdomen is soft. There is no mass.     Tenderness: There is no abdominal tenderness.  Lymphadenopathy:     Head:     Right side of head: No submandibular adenopathy.     Left side of head: No submandibular adenopathy.     Cervical: No cervical adenopathy.  Skin:    General: Skin is warm.     Findings: No erythema or rash.  Neurological:     General: No focal deficit present.     Mental Status: He is alert and oriented to person, place, and time.     Comments: Mildly unstable gait, not assisted.  Psychiatric:        Mood and Affect: Mood and affect normal.    ASSESSMENT AND PLAN:  Luis Herrera was seen today for sinus problem.  Diagnoses and all orders for this visit:  Essential hypertension -     CBC; Future -     Basic metabolic panel with GFR; Future  Frequent PVCs -     CBC; Future  Bad odor of urine -     CBC; Future -     Urinalysis with Culture Reflex  Elevated transaminase level -     CBC; Future -     Hepatic function panel; Future  Cough, unspecified type    Orders Placed This Encounter  Procedures   CBC   Basic metabolic panel with GFR   Hepatic function panel   Urinalysis with Culture Reflex     Essential hypertension -     CBC; Future -     Basic metabolic panel with GFR; Future  Frequent PVCs -     CBC; Future  Bad odor of urine -     CBC; Future -     Urinalysis w microscopic + reflex cultur  Elevated transaminase level -     CBC; Future -     Hepatic function panel; Future  Cough, unspecified type    Return if symptoms worsen or fail to improve.  Cynthia Cogle G. Waldemar Siegel, MD  [facility]. [place] office.     [1]  Current Outpatient Medications on File Prior to Visit  Medication Sig Dispense Refill   amiodarone  (PACERONE ) 200 MG tablet TAKE 1/2 TABLET BY MOUTH DAILY 50 tablet 3   amLODipine  (NORVASC ) 5 MG tablet TAKE 1 TABLET (5 MG TOTAL) BY MOUTH DAILY. 90 tablet 3   aspirin  EC 81 MG tablet Take 1 tablet (81 mg total) by mouth daily. Swallow whole. 90 tablet 3   atorvastatin  (LIPITOR ) 80 MG tablet TAKE 1 TABLET BY MOUTH EVERY DAY 90 tablet 1   Calcium  Carb-Cholecalciferol (CALCIUM  PLUS VITAMIN D3 PO) Take 2 tablets by mouth daily. Gummy supplements     carvedilol  (COREG ) 3.125 MG tablet TAKE 1 TABLET BY MOUTH 2 TIMES DAILY. 180 tablet 3   ezetimibe  (ZETIA ) 10 MG tablet TAKE 1 TABLET BY MOUTH EVERY DAY 90 tablet 3   furosemide  (LASIX ) 40 MG tablet TAKE ONE TABLET (40 MG) EVERY OTHER DAY ALTERNATING WITH 1 1/2 TABLETS (60 MG) ON THE OPPOSITE DAYS. 145 tablet 3   levothyroxine  (SYNTHROID ) 50 MCG tablet Take 1 tablet (50 mcg total) by mouth daily before breakfast. 90 tablet 3  Multiple Vitamins-Minerals (PRESERVISION AREDS 2) CAPS Take 1 capsule by mouth 2 (two) times daily.     sacubitril -valsartan  (ENTRESTO ) 24-26 MG TAKE 1 TABLET BY MOUTH TWICE A DAY 180 tablet 0   tamsulosin  (FLOMAX ) 0.4 MG CAPS capsule Take 0.4 mg by mouth in the morning and at bedtime.     No current facility-administered medications on file prior to visit.  [2]  Allergies Allergen Reactions   Sulfa  Antibiotics Other (See Comments)    Garnette Louder Syndrome   Bactrim   [Sulfamethoxazole -Trimethoprim ] Other (See Comments)    Garnette Louder Syndrome   "

## 2024-03-06 NOTE — Patient Instructions (Addendum)
 A few things to remember from today's visit:  Essential hypertension - Plan: CBC, Basic metabolic panel with GFR  Frequent PVCs - Plan: CBC  Bad odor of urine - Plan: CBC, Urinalysis with Culture Reflex  Elevated transaminase level - Plan: CBC, Hepatic function panel  Cough, unspecified type  No changes today. Continue adequate hydration. Monitor for fever or new symptoms. Arrange appt with cardiologist.  Melatonin 5-15 mg 1-2 hours before bedtime with empty stomach.  Do not use My Chart to request refills or for acute issues that need immediate attention. If you send a my chart message, it may take a few days to be addressed, specially if I am not in the office.  Please be sure medication list is accurate. If a new problem present, please set up appointment sooner than planned today.

## 2024-03-07 ENCOUNTER — Telehealth: Payer: Self-pay | Admitting: *Deleted

## 2024-03-07 ENCOUNTER — Ambulatory Visit: Payer: Self-pay | Admitting: Family Medicine

## 2024-03-07 ENCOUNTER — Ambulatory Visit: Admitting: Physician Assistant

## 2024-03-07 VITALS — BP 104/70 | HR 73 | Ht 71.0 in | Wt 199.0 lb

## 2024-03-07 DIAGNOSIS — Z79899 Other long term (current) drug therapy: Secondary | ICD-10-CM | POA: Diagnosis not present

## 2024-03-07 DIAGNOSIS — E785 Hyperlipidemia, unspecified: Secondary | ICD-10-CM

## 2024-03-07 DIAGNOSIS — I5031 Acute diastolic (congestive) heart failure: Secondary | ICD-10-CM

## 2024-03-07 DIAGNOSIS — I350 Nonrheumatic aortic (valve) stenosis: Secondary | ICD-10-CM | POA: Diagnosis not present

## 2024-03-07 DIAGNOSIS — I493 Ventricular premature depolarization: Secondary | ICD-10-CM | POA: Diagnosis not present

## 2024-03-07 DIAGNOSIS — I10 Essential (primary) hypertension: Secondary | ICD-10-CM | POA: Diagnosis not present

## 2024-03-07 DIAGNOSIS — Z952 Presence of prosthetic heart valve: Secondary | ICD-10-CM | POA: Diagnosis not present

## 2024-03-07 DIAGNOSIS — I2510 Atherosclerotic heart disease of native coronary artery without angina pectoris: Secondary | ICD-10-CM | POA: Diagnosis not present

## 2024-03-07 DIAGNOSIS — Z951 Presence of aortocoronary bypass graft: Secondary | ICD-10-CM | POA: Diagnosis not present

## 2024-03-07 DIAGNOSIS — N390 Urinary tract infection, site not specified: Secondary | ICD-10-CM

## 2024-03-07 MED ORDER — EZETIMIBE 10 MG PO TABS: 10.0000 mg | ORAL_TABLET | Freq: Every day | ORAL | 3 refills | Status: AC

## 2024-03-07 MED ORDER — AMLODIPINE BESYLATE 5 MG PO TABS: 5.0000 mg | ORAL_TABLET | Freq: Every day | ORAL | 3 refills | Status: AC

## 2024-03-07 NOTE — Telephone Encounter (Signed)
 Results sent through MyChart message. BJ

## 2024-03-07 NOTE — Patient Instructions (Signed)
 Medication Instructions:  Your physician recommends that you continue on your current medications as directed. Please refer to the Current Medication list given to you today. *If you need a refill on your cardiac medications before your next appointment, please call your pharmacy*  Lab Work: None ordered If you have labs (blood work) drawn today and your tests are completely normal, you will receive your results only by: MyChart Message (if you have MyChart) OR A paper copy in the mail If you have any lab test that is abnormal or we need to change your treatment, we will call you to review the results.  Testing/Procedures: None ordered  Follow-Up: At [facility], you and your health needs are our priority.  As part of our continuing mission to provide you with exceptional heart care, our providers are all part of one team.  This team includes your primary Cardiologist (physician) and Advanced Practice Providers or APPs (Physician Assistants and Nurse Practitioners) who all work together to provide you with the care you need, when you need it.  Your next appointment:   FOLLOW UP AS SCHEDULED   Provider:   [name], MD    We recommend signing up for the patient portal called MyChart.  Sign up information is provided on this After Visit Summary.  MyChart is used to connect with patients for Virtual Visits (Telemedicine).  Patients are able to view lab/test results, encounter notes, upcoming appointments, etc.  Non-urgent messages can be sent to your provider as well.   To learn more about what you can do with MyChart, go to [email or web address].   Other Instructions

## 2024-03-07 NOTE — Progress Notes (Signed)
 " Cardiology Office Note   Date:  03/07/2024  ID:  Luis Herrera, DOB December 15, 1940, MRN [id number] PCP: Luis, Betty G, MD  [facility] Providers Cardiologist:  [name], MD   History of Present Illness Luis Herrera is a 84 y.o. 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 If pt continued)   With hypotension a repeat echo was ordered  Aug 2024  Echo showed LVEF 50%   TAVR valve mean gradient 20mm HG, down from previous.  Mild perivalvular AI.  UA showed UTI and he was treated    A CT was also ordered to r/o HALT  This showed good leaflet excursion. The pt was seen by MARLA Hummer on 10/16/22   He was still hypotensive.  Actually still taking Entresto .   This was then stopped and his BP improved    Over time, BP improved  he resumed both carvedilol  and Entresto    Cause for hypotension not found   He was seen Aug 2025. Echo  LVEF normal  Mean gradient was 22 mm (compared to 20 mm on echo from Aug 2024)  MIld PVL  Note:   I did not think calculated area or DVI were accurate.   MV opens adequately.   Today, he presents with a hx of PVCs and aortic valve replacement  for follow-up after a recent ED visit for cough and congestion. He was referred by Dr. [name] for follow-up on his EKG and PVCs.  He presented after the ED visit for cough, congestion, and fatigue. His cough  has evolved from dry to looser, is mainly during the day, and is improving at night.  He has PVCs with a recent EKG showing a low PVC burden of 2.8% without palpitations or chest fluttering. He takes carvedilol  for these extra beats. He has a prior surgical aortic valve replacement and a TAVR in August 2023, as well as prior CABG.  He drinks 1 to 2 cups of half-caffeinated coffee daily and avoids sodas and teas. He does an occasional coke zero.   He has difficulty sleeping at night, which improves when his cough is suppressed with Delsym.  His legs feel less fatigued when walking short distances. He notes some swelling in one leg, possibly related to prior knee replacement, this has been chronic. He takes furosemide  for leg swelling.  Reports no shortness of breath nor dyspnea on exertion. Reports no chest pain, pressure, or tightness. No edema, orthopnea, PND. Reports no palpitations.   Discussed the use of AI scribe software for clinical note transcription with the patient, who gave verbal consent to proceed.  ROS: pertinent ROS in HPI  Studies Reviewed  Echo 10/14/23  IMPRESSIONS     1. Left ventricular ejection fraction, by estimation, is 55 to 60%. The  left ventricle has normal function. The left ventricle has no regional  wall motion abnormalities. Left ventricular diastolic parameters are  consistent with Grade I diastolic  dysfunction (impaired relaxation). Elevated left atrial pressure.   2. Right ventricular systolic function is normal. The right ventricular  size is mildly enlarged. There is normal pulmonary artery systolic  pressure. The estimated right ventricular systolic pressure is 25.3 mmHg.   3. Left atrial size was severely dilated.   4. The mitral valve is normal in structure. Mild mitral valve  regurgitation. No evidence of mitral stenosis. Moderate mitral annular  calcification.   5. The aortic valve has been repaired/replaced. Mild perivalvular Aortic   valve regurgitation. No aortic stenosis is present. S/P valve-in-valve  TAVR (prior SAVR). There is a 23 mm Sapien prosthetic (TAVR) valve present  in the aortic position. Procedure  Date: 09/30/2021. Aortic regurgitation PHT measures 485 msec. Aortic valve  area, by VTI measures 0.71 cm. Aortic valve mean gradient measures 22.0  mmHg. Aortic valve Vmax measures 3.02 m/s.   6. The inferior vena cava is normal in size with greater than 50%  respiratory variability, suggesting right atrial pressure of 3 mmHg.   7. Compared to study dated 10/02/2023, mean AVG and Vmax are stable, AVA  has decreased from 1.12cm2 to 0.71cm2 and DI has decreased from 0.33 to  0.22.       Physical Exam VS:  BP 104/70   Pulse 73   Ht 5' 11 (1.803 m)   Wt 199 lb (90.3 kg)   SpO2 99%   BMI 27.75 kg/m        Wt Readings from Last 3 Encounters:  03/07/24 199 lb (90.3 kg)  03/06/24 200 lb 9.6 oz (91 kg)  02/29/24 194 lb (88 kg)    GEN: Well nourished, well developed in no acute distress NECK: No JVD; No carotid bruits CARDIAC: RRR, no murmurs, rubs, gallops RESPIRATORY:  Clear to auscultation without rales, wheezing or rhonchi  ABDOMEN: Soft, non-tender, non-distended EXTREMITIES:  No edema; No deformity   ASSESSMENT AND PLAN  Coronary artery disease, status post CABG Coronary artery disease managed with CABG. LDL cholesterol well-controlled at 68 mg/dL. - Continue current management and monitoring.  Aortic valve replacement, status post TAVR Status post TAVR with no new symptoms. Echocardiogram from August 2023 showed strong heart pump function (55-60%). - Continue regular follow-up with cardiologist.  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 No symptoms reported. Recent EKGs show no significant changes. - Continue current management with carvedilol . - Maintain hydration and monitor for any new symptoms.  Hyperlipidemia LDL cholesterol well-controlled at  68 mg/dL. - Continue current lipid management.     Dispo: He can return in 3 months  Signed, Orren LOISE Fabry, PA-C   "

## 2024-03-07 NOTE — Telephone Encounter (Signed)
 Copied from CRM #[id number]. Topic: Clinical - Lab/Test Results >> Mar 07, 2024  4:31 PM Burnard DEL wrote: Reason for CRM: Patient would like to know if provider has had a chance to review his lab results. He received them through mychart.

## 2024-03-08 LAB — URINALYSIS W MICROSCOPIC + REFLEX CULTURE
Bilirubin Urine: NEGATIVE
Glucose, UA: NEGATIVE
Nitrites, Initial: NEGATIVE
Specific Gravity, Urine: 1.018 (ref 1.001–1.035)
pH: 5.5 (ref 5.0–8.0)

## 2024-03-08 LAB — CULTURE INDICATED

## 2024-03-08 LAB — URINE CULTURE
MICRO NUMBER:: 17488704
SPECIMEN QUALITY:: ADEQUATE

## 2024-03-10 MED ORDER — CEFDINIR 300 MG PO CAPS: 300.0000 mg | ORAL_CAPSULE | Freq: Two times a day (BID) | ORAL | 0 refills | Status: AC

## 2024-04-11 ENCOUNTER — Ambulatory Visit: Admitting: Podiatry

## 2024-05-18 ENCOUNTER — Ambulatory Visit: Admitting: Internal Medicine
